# Patient Record
Sex: Male | Born: 1940 | Race: White | Hispanic: No | Marital: Married | Smoking: Former smoker
Health system: Southern US, Community
[De-identification: ages and names within clinical notes are randomized; demographics above are authoritative.]

## PROBLEM LIST (undated history)

## (undated) DIAGNOSIS — I5042 Chronic combined systolic (congestive) and diastolic (congestive) heart failure: Secondary | ICD-10-CM

## (undated) DIAGNOSIS — N400 Enlarged prostate without lower urinary tract symptoms: Secondary | ICD-10-CM

## (undated) DIAGNOSIS — M542 Cervicalgia: Secondary | ICD-10-CM

## (undated) DIAGNOSIS — I4891 Unspecified atrial fibrillation: Secondary | ICD-10-CM

## (undated) DIAGNOSIS — K219 Gastro-esophageal reflux disease without esophagitis: Secondary | ICD-10-CM

## (undated) DIAGNOSIS — G629 Polyneuropathy, unspecified: Secondary | ICD-10-CM

## (undated) DIAGNOSIS — G4700 Insomnia, unspecified: Secondary | ICD-10-CM

## (undated) DIAGNOSIS — N189 Chronic kidney disease, unspecified: Secondary | ICD-10-CM

## (undated) HISTORY — DX: Polyneuropathy, unspecified: G62.9

## (undated) HISTORY — PX: TONSILLECTOMY: SUR1361

## (undated) HISTORY — DX: Cervicalgia: M54.2

---

## 1997-06-19 ENCOUNTER — Emergency Department (HOSPITAL_COMMUNITY): Admission: EM | Admit: 1997-06-19 | Discharge: 1997-06-19 | Payer: Self-pay | Admitting: Emergency Medicine

## 1998-01-26 ENCOUNTER — Ambulatory Visit (HOSPITAL_COMMUNITY): Admission: RE | Admit: 1998-01-26 | Discharge: 1998-01-26 | Payer: Self-pay | Admitting: Gastroenterology

## 1999-01-08 ENCOUNTER — Ambulatory Visit (HOSPITAL_COMMUNITY): Admission: RE | Admit: 1999-01-08 | Discharge: 1999-01-08 | Payer: Self-pay | Admitting: Internal Medicine

## 2008-02-19 HISTORY — PX: SHOULDER ARTHROSCOPY: SHX128

## 2008-03-02 ENCOUNTER — Encounter: Admission: RE | Admit: 2008-03-02 | Discharge: 2008-03-02 | Payer: Self-pay | Admitting: Family Medicine

## 2008-07-19 ENCOUNTER — Encounter: Admission: RE | Admit: 2008-07-19 | Discharge: 2008-07-19 | Payer: Self-pay | Admitting: Emergency Medicine

## 2009-08-03 ENCOUNTER — Ambulatory Visit (HOSPITAL_COMMUNITY): Admission: RE | Admit: 2009-08-03 | Discharge: 2009-08-03 | Payer: Self-pay | Admitting: Specialist

## 2010-04-19 ENCOUNTER — Other Ambulatory Visit: Payer: Self-pay | Admitting: Otolaryngology

## 2010-07-06 NOTE — Procedures (Signed)
[place]  Patient:    AGAM DAVENPORT                        MRN: [id number] Proc. Date: 01/08/99 Adm. Date:  [id number] Attending:  Mervin Hack CC:         Hedwig Morton. Juanda Chance, M.D. [facility]                           Procedure Report  PROCEDURE:  Upper endoscopy with esophageal dilation.  INDICATIONS:  This 70 year old white male has a history of benign distal esophageal stricture.  ______  dilatation was carried out a year and a half ago.  The patient had run out of Prevacid.  In the past 2-3 weeks, he developed solid food dysphagia. He was put back on Prevacid 30 mg twice a day with some mild improvement of his  symptoms, but persistent solid food dysphagia.  He denies hoarseness, coughing t night, or chest pain.  He is undergoing an upper endoscopy with esophageal dilation.  ENDOSCOPE:  Fujinon single-channel video endoscope.  SEDATION:  Versed 10 mg IV and Demerol 100 mg IV.  FINDINGS:  The Fujinon single-channel video endoscope passed under direct vision through the posterior oropharynx into the esophagus.  The patient was monitored by pulse oximeter.  His oxygen saturations were normal.  He was very cooperative. The gag reflex was present.  The proximal and mid esophageal mucosa were unremarkable. In the distal esophagus, there was a concentric, fibrotic esophageal stricture which allowed the endoscope to traverse into a hiatal hernia without any resistance.  The diameter of the stricture was about 13 mm.  There were no acute erosions or any acute inflammatory changes within the stricture.  STOMACH:  The stomach was insufflated with air.  There was a non-reducible hiatal hernia extending from 36-40 cm from the incisors.  The gastric folds were otherwise normal.  There was no bile reflux.  The gastric antrum was unremarkable. Normal pyloric outlet.  Retroflexion of endoscope could not be carried out.  The patient was unable  to retain insufflated air.  DUODENUM:  The duodenum, duodenal bulb, and descending duodenum were normal. The Savary guide wire was then place through the endoscope.  The endoscope was retracted, and a Savary dilator was passed over the guide wire without using fluoroscopy.  A 14 mm, 16 mm, 17 mm, and 18 mm dilators passed over the guide wire. There was blood on the last dilator.  The patient tolerated the procedure well.  IMPRESSION: 1. Benign distal esophageal stricture, status post dilation to 18 mm. 2. A hiatal hernia.  PLAN: 1. Continue antireflux measures. 2. Resume Prevacid 30 mg a day. 3. If the patient needs another dilation within a year, I think that he also    consider the possibility of Nissen fundoplication.  This has been discussed ith    the patient who currently prefers medical therapy.  He will abort the use of  aspirin and NSAIDs. DD:  01/08/99 TD:  01/09/99 Job: [id number]

## 2010-09-10 ENCOUNTER — Other Ambulatory Visit: Payer: Self-pay | Admitting: Neurosurgery

## 2010-09-10 DIAGNOSIS — M5020 Other cervical disc displacement, unspecified cervical region: Secondary | ICD-10-CM

## 2010-09-14 ENCOUNTER — Ambulatory Visit
Admission: RE | Admit: 2010-09-14 | Discharge: 2010-09-14 | Disposition: A | Discharge status: Home / Self Care | Payer: Medicare Other | Source: Ambulatory Visit | Attending: Neurosurgery | Admitting: Neurosurgery

## 2010-09-14 DIAGNOSIS — M5020 Other cervical disc displacement, unspecified cervical region: Secondary | ICD-10-CM

## 2010-12-21 ENCOUNTER — Ambulatory Visit (HOSPITAL_COMMUNITY)
Admission: RE | Admit: 2010-12-21 | Discharge: 2010-12-21 | Disposition: A | Discharge status: Home / Self Care | Payer: Medicare Other | Source: Ambulatory Visit | Attending: Specialist | Admitting: Specialist

## 2010-12-21 ENCOUNTER — Other Ambulatory Visit (HOSPITAL_COMMUNITY): Payer: Self-pay | Admitting: Specialist

## 2010-12-21 DIAGNOSIS — Z0389 Encounter for observation for other suspected diseases and conditions ruled out: Secondary | ICD-10-CM | POA: Insufficient documentation

## 2010-12-21 DIAGNOSIS — R52 Pain, unspecified: Secondary | ICD-10-CM

## 2011-01-24 ENCOUNTER — Other Ambulatory Visit: Payer: Self-pay | Admitting: Family Medicine

## 2011-01-24 DIAGNOSIS — Z136 Encounter for screening for cardiovascular disorders: Secondary | ICD-10-CM

## 2011-01-25 ENCOUNTER — Other Ambulatory Visit: Payer: Self-pay | Admitting: Pain Medicine

## 2011-01-26 ENCOUNTER — Encounter (HOSPITAL_BASED_OUTPATIENT_CLINIC_OR_DEPARTMENT_OTHER): Payer: Self-pay | Admitting: Anesthesiology

## 2011-01-28 ENCOUNTER — Other Ambulatory Visit: Payer: Self-pay | Admitting: Pain Medicine

## 2011-01-28 ENCOUNTER — Encounter (HOSPITAL_BASED_OUTPATIENT_CLINIC_OR_DEPARTMENT_OTHER): Payer: Self-pay | Admitting: *Deleted

## 2011-01-28 NOTE — Progress Notes (Signed)
To [facility] at 1030.Hg,ekg on arrival.NPO after mn.

## 2011-01-29 ENCOUNTER — Ambulatory Visit (HOSPITAL_BASED_OUTPATIENT_CLINIC_OR_DEPARTMENT_OTHER): Admission: RE | Admit: 2011-01-29 | Payer: Medicare Other | Source: Ambulatory Visit | Admitting: Specialist

## 2011-01-29 ENCOUNTER — Encounter (HOSPITAL_BASED_OUTPATIENT_CLINIC_OR_DEPARTMENT_OTHER): Admission: RE | Payer: Self-pay | Source: Ambulatory Visit

## 2011-01-29 HISTORY — DX: Insomnia, unspecified: G47.00

## 2011-01-29 HISTORY — DX: Gastro-esophageal reflux disease without esophagitis: K21.9

## 2011-01-29 SURGERY — SHOULDER ARTHROSCOPY WITH ROTATOR CUFF REPAIR AND SUBACROMIAL DECOMPRESSION
Anesthesia: General | Laterality: Right

## 2011-02-04 ENCOUNTER — Other Ambulatory Visit: Payer: Self-pay | Admitting: Pain Medicine

## 2011-02-13 ENCOUNTER — Encounter (HOSPITAL_BASED_OUTPATIENT_CLINIC_OR_DEPARTMENT_OTHER): Payer: Self-pay | Admitting: *Deleted

## 2011-02-13 NOTE — Progress Notes (Signed)
To [facility] at 1030.Hg ,Ekg on arrival.npo after mn.

## 2011-02-15 ENCOUNTER — Encounter (HOSPITAL_BASED_OUTPATIENT_CLINIC_OR_DEPARTMENT_OTHER): Payer: Self-pay | Admitting: Anesthesiology

## 2011-02-15 ENCOUNTER — Encounter (HOSPITAL_BASED_OUTPATIENT_CLINIC_OR_DEPARTMENT_OTHER): Payer: Self-pay | Admitting: *Deleted

## 2011-02-15 ENCOUNTER — Encounter (HOSPITAL_BASED_OUTPATIENT_CLINIC_OR_DEPARTMENT_OTHER)
Admission: RE | Disposition: A | Discharge status: Home / Self Care | Payer: Self-pay | Source: Ambulatory Visit | Attending: Specialist

## 2011-02-15 ENCOUNTER — Ambulatory Visit (HOSPITAL_BASED_OUTPATIENT_CLINIC_OR_DEPARTMENT_OTHER): Payer: Medicare Other | Admitting: Anesthesiology

## 2011-02-15 ENCOUNTER — Ambulatory Visit (HOSPITAL_BASED_OUTPATIENT_CLINIC_OR_DEPARTMENT_OTHER)
Admission: RE | Admit: 2011-02-15 | Discharge: 2011-02-15 | Disposition: A | Discharge status: Home / Self Care | Payer: Medicare Other | Source: Ambulatory Visit | Attending: Specialist | Admitting: Specialist

## 2011-02-15 ENCOUNTER — Other Ambulatory Visit: Payer: Self-pay

## 2011-02-15 DIAGNOSIS — Z7982 Long term (current) use of aspirin: Secondary | ICD-10-CM | POA: Insufficient documentation

## 2011-02-15 DIAGNOSIS — M25519 Pain in unspecified shoulder: Secondary | ICD-10-CM | POA: Insufficient documentation

## 2011-02-15 DIAGNOSIS — N400 Enlarged prostate without lower urinary tract symptoms: Secondary | ICD-10-CM | POA: Insufficient documentation

## 2011-02-15 DIAGNOSIS — M67919 Unspecified disorder of synovium and tendon, unspecified shoulder: Secondary | ICD-10-CM | POA: Insufficient documentation

## 2011-02-15 DIAGNOSIS — K219 Gastro-esophageal reflux disease without esophagitis: Secondary | ICD-10-CM | POA: Insufficient documentation

## 2011-02-15 DIAGNOSIS — M719 Bursopathy, unspecified: Secondary | ICD-10-CM | POA: Insufficient documentation

## 2011-02-15 DIAGNOSIS — Z79899 Other long term (current) drug therapy: Secondary | ICD-10-CM | POA: Insufficient documentation

## 2011-02-15 DIAGNOSIS — R29898 Other symptoms and signs involving the musculoskeletal system: Secondary | ICD-10-CM | POA: Insufficient documentation

## 2011-02-15 DIAGNOSIS — Z9889 Other specified postprocedural states: Secondary | ICD-10-CM

## 2011-02-15 DIAGNOSIS — M24619 Ankylosis, unspecified shoulder: Secondary | ICD-10-CM | POA: Insufficient documentation

## 2011-02-15 HISTORY — DX: Benign prostatic hyperplasia without lower urinary tract symptoms: N40.0

## 2011-02-15 HISTORY — PX: SHOULDER ARTHROSCOPY: SHX128

## 2011-02-15 LAB — POCT HEMOGLOBIN-HEMACUE: Hemoglobin: 13.5 g/dL (ref 13.0–17.0)

## 2011-02-15 SURGERY — ARTHROSCOPY, SHOULDER
Anesthesia: General | Site: Shoulder | Laterality: Right | Wound class: Clean

## 2011-02-15 MED ORDER — ROCURONIUM BROMIDE 100 MG/10ML IV SOLN
INTRAVENOUS | Status: DC | PRN
  Administered 2011-02-15: 30 mg via INTRAVENOUS

## 2011-02-15 MED ORDER — SODIUM CHLORIDE 0.9 % IR SOLN
Status: DC | PRN
  Administered 2011-02-15: 12000 mL

## 2011-02-15 MED ORDER — CHLORHEXIDINE GLUCONATE 4 % EX LIQD: 60.0000 mL | Freq: Once | CUTANEOUS | Status: DC

## 2011-02-15 MED ORDER — SODIUM CHLORIDE 0.9 % IV SOLN: INTRAVENOUS | Status: DC

## 2011-02-15 MED ORDER — LACTATED RINGERS IV SOLN
INTRAVENOUS | Status: DC | PRN
  Administered 2011-02-15 (×2): via INTRAVENOUS

## 2011-02-15 MED ORDER — MIDAZOLAM HCL 5 MG/5ML IJ SOLN
INTRAMUSCULAR | Status: DC | PRN
  Administered 2011-02-15: 2 mg via INTRAVENOUS

## 2011-02-15 MED ORDER — MIDAZOLAM HCL 2 MG/2ML IJ SOLN
2.0000 mg | Freq: Once | INTRAMUSCULAR | Status: AC
  Administered 2011-02-15: 2 mg via INTRAVENOUS

## 2011-02-15 MED ORDER — NEOSTIGMINE METHYLSULFATE 1 MG/ML IJ SOLN
INTRAMUSCULAR | Status: DC | PRN
  Administered 2011-02-15: 4 mg via INTRAVENOUS

## 2011-02-15 MED ORDER — FENTANYL CITRATE 0.05 MG/ML IJ SOLN
100.0000 ug | Freq: Once | INTRAMUSCULAR | Status: AC
  Administered 2011-02-15: 100 ug via INTRAVENOUS

## 2011-02-15 MED ORDER — SODIUM CHLORIDE 0.9 % IJ SOLN
INTRAMUSCULAR | Status: DC | PRN
  Administered 2011-02-15 (×2): via INTRAMUSCULAR

## 2011-02-15 MED ORDER — METHOCARBAMOL 500 MG PO TABS: 500.0000 mg | ORAL_TABLET | Freq: Four times a day (QID) | ORAL | Status: AC | PRN

## 2011-02-15 MED ORDER — LIDOCAINE HCL (CARDIAC) 20 MG/ML IV SOLN
INTRAVENOUS | Status: DC | PRN
  Administered 2011-02-15: 100 mg via INTRAVENOUS

## 2011-02-15 MED ORDER — KETOROLAC TROMETHAMINE 30 MG/ML IJ SOLN
INTRAMUSCULAR | Status: DC | PRN
  Administered 2011-02-15: 30 mg via INTRAVENOUS

## 2011-02-15 MED ORDER — FENTANYL CITRATE 0.05 MG/ML IJ SOLN
INTRAMUSCULAR | Status: DC | PRN
  Administered 2011-02-15: 50 ug via INTRAVENOUS
  Administered 2011-02-15: 100 ug via INTRAVENOUS
  Administered 2011-02-15: 50 ug via INTRAVENOUS

## 2011-02-15 MED ORDER — LACTATED RINGERS IV SOLN: INTRAVENOUS | Status: DC | PRN

## 2011-02-15 MED ORDER — HYDROMORPHONE HCL 2 MG PO TABS: 2.0000 mg | ORAL_TABLET | ORAL | Status: AC | PRN

## 2011-02-15 MED ORDER — ONDANSETRON HCL 4 MG/2ML IJ SOLN
INTRAMUSCULAR | Status: DC | PRN
  Administered 2011-02-15: 4 mg via INTRAVENOUS

## 2011-02-15 MED ORDER — GLYCOPYRROLATE 0.2 MG/ML IJ SOLN
INTRAMUSCULAR | Status: DC | PRN
  Administered 2011-02-15: .4 mg via INTRAVENOUS

## 2011-02-15 MED ORDER — PROPOFOL 10 MG/ML IV EMUL
INTRAVENOUS | Status: DC | PRN
  Administered 2011-02-15: 180 mg via INTRAVENOUS

## 2011-02-15 MED ORDER — SUCCINYLCHOLINE CHLORIDE 20 MG/ML IJ SOLN
INTRAMUSCULAR | Status: DC | PRN
  Administered 2011-02-15: 60 mg via INTRAVENOUS

## 2011-02-15 MED ORDER — TRIAMCINOLONE ACETONIDE 40 MG/ML IJ SUSP
INTRAMUSCULAR | Status: DC | PRN
  Administered 2011-02-15: 80 mg

## 2011-02-15 MED ORDER — CEFAZOLIN SODIUM 1-5 GM-% IV SOLN: 1.0000 g | INTRAVENOUS | Status: DC

## 2011-02-15 MED ORDER — CEPHALEXIN 500 MG PO CAPS: 500.0000 mg | ORAL_CAPSULE | Freq: Three times a day (TID) | ORAL | Status: AC

## 2011-02-15 MED ORDER — PROMETHAZINE HCL 25 MG/ML IJ SOLN: 6.2500 mg | INTRAMUSCULAR | Status: DC | PRN

## 2011-02-15 MED ORDER — LACTATED RINGERS IV SOLN
INTRAVENOUS | Status: DC
  Administered 2011-02-15: 100 mL/h via INTRAVENOUS

## 2011-02-15 MED ORDER — LACTATED RINGERS IV SOLN: INTRAVENOUS | Status: DC

## 2011-02-15 MED ORDER — DEXAMETHASONE SODIUM PHOSPHATE 4 MG/ML IJ SOLN
INTRAMUSCULAR | Status: DC | PRN
  Administered 2011-02-15: 4 mg via INTRAVENOUS

## 2011-02-15 MED ORDER — POVIDONE-IODINE 7.5 % EX SOLN: Freq: Once | CUTANEOUS | Status: DC

## 2011-02-15 MED ORDER — BUPIVACAINE HCL (PF) 0.25 % IJ SOLN
INTRAMUSCULAR | Status: DC | PRN
  Administered 2011-02-15: 10 mL

## 2011-02-15 MED ORDER — ROPIVACAINE HCL 5 MG/ML IJ SOLN
INTRAMUSCULAR | Status: DC | PRN
  Administered 2011-02-15: 30 mL

## 2011-02-15 MED ORDER — CEFAZOLIN SODIUM-DEXTROSE 2-3 GM-% IV SOLR: 2.0000 g | INTRAVENOUS | Status: DC

## 2011-02-15 SURGICAL SUPPLY — 75 items
BLADE CUDA 4.2 (BLADE) IMPLANT
BLADE CUDA GRT WHITE 3.5 (BLADE) ×3 IMPLANT
BLADE CUTTER GATOR 3.5 (BLADE) IMPLANT
BLADE GREAT WHITE 4.2 (BLADE) ×3 IMPLANT
BLADE SURG 11 STRL SS (BLADE) ×3 IMPLANT
BLADE SURG 15 STRL LF DISP TIS (BLADE) ×1 IMPLANT
BLADE SURG 15 STRL SS (BLADE)
BUR 3.5 LG SPHERICAL (BURR) IMPLANT
BUR OVAL 6.0 (BURR) ×3 IMPLANT
BURR 3.5 LG SPHERICAL (BURR)
CANISTER SUCT LVC 12 LTR MEDI- (MISCELLANEOUS) ×7 IMPLANT
CANISTER SUCTION 2500CC (MISCELLANEOUS) ×2 IMPLANT
CANNULA 5.75X7 CRYSTAL CLEAR (CANNULA) ×3 IMPLANT
CANNULA 5.75X71 LONG (CANNULA) IMPLANT
CANNULA TWIST IN 8.25X7CM (CANNULA) ×6 IMPLANT
CLOTH BEACON ORANGE TIMEOUT ST (SAFETY) ×3 IMPLANT
COVER MAYO STAND STRL (DRAPES) ×3 IMPLANT
COVER TABLE BACK 60X90 (DRAPES) ×3 IMPLANT
DRAPE LG THREE QUARTER DISP (DRAPES) IMPLANT
DRAPE ORTHO SPLIT 77X108 STRL (DRAPES) ×6
DRAPE POUCH INSTRU U-SHP 10X18 (DRAPES) ×3 IMPLANT
DRAPE STERI 35X30 U-POUCH (DRAPES) ×3 IMPLANT
DRAPE SURG 17X23 STRL (DRAPES) ×3 IMPLANT
DRAPE SURG ORHT 6 SPLT 77X108 (DRAPES) ×4 IMPLANT
DRAPE U-SHAPE 47X51 STRL (DRAPES) ×3 IMPLANT
DRSG PAD ABDOMINAL 8X10 ST (GAUZE/BANDAGES/DRESSINGS) ×4 IMPLANT
DURAPREP 26ML APPLICATOR (WOUND CARE) ×3 IMPLANT
ELECT MENISCUS 165MM 90D (ELECTRODE) IMPLANT
ELECT REM PT RETURN 9FT ADLT (ELECTROSURGICAL) ×3
ELECTRODE REM PT RTRN 9FT ADLT (ELECTROSURGICAL) ×2 IMPLANT
FIBERSTICK 2 (SUTURE) IMPLANT
GAUZE SPONGE 4X4 12PLY STRL LF (GAUZE/BANDAGES/DRESSINGS) ×2 IMPLANT
GAUZE XEROFORM 1X8 LF (GAUZE/BANDAGES/DRESSINGS) ×3 IMPLANT
GLOVE BIO SURGEON STRL SZ7.5 (GLOVE) ×3 IMPLANT
GLOVE INDICATOR 6.5 STRL GRN (GLOVE) ×4 IMPLANT
GLOVE INDICATOR 8.0 STRL GRN (GLOVE) ×6 IMPLANT
GLOVE SURG ORTHO 8.0 STRL STRW (GLOVE) ×3 IMPLANT
GOWN STRL REIN XL XLG (GOWN DISPOSABLE) ×3 IMPLANT
GOWN W/COTTON TOWEL STD LRG (GOWNS) ×3 IMPLANT
GOWN XL W/COTTON TOWEL STD (GOWNS) ×3 IMPLANT
KIT SHOULDER TRACTION (DRAPES) ×3 IMPLANT
LASSO SUT 90 DEGREE (SUTURE) IMPLANT
NDL 1/2 CIR CATGUT .05X1.09 (NEEDLE) IMPLANT
NDL SCORPION (NEEDLE) IMPLANT
NDL SPNL 18GX3.5 QUINCKE PK (NEEDLE) ×1 IMPLANT
NEEDLE 1/2 CIR CATGUT .05X1.09 (NEEDLE) IMPLANT
NEEDLE HYPO 22GX1.5 SAFETY (NEEDLE) ×3 IMPLANT
NEEDLE SCORPION (NEEDLE) IMPLANT
NEEDLE SPNL 18GX3.5 QUINCKE PK (NEEDLE) ×3 IMPLANT
NS IRRIG 500ML POUR BTL (IV SOLUTION) IMPLANT
PACK BASIN DAY SURGERY FS (CUSTOM PROCEDURE TRAY) ×3 IMPLANT
PENCIL BUTTON HOLSTER BLD 10FT (ELECTRODE) IMPLANT
SET ARTHROSCOPY TUBING (MISCELLANEOUS) ×3
SET ARTHROSCOPY TUBING LN (MISCELLANEOUS) ×2 IMPLANT
SLING ARM FOAM STRAP LRG (SOFTGOODS) ×2 IMPLANT
SLING ULTRA II AB L (ORTHOPEDIC SUPPLIES) IMPLANT
SLING ULTRA II AB S (ORTHOPEDIC SUPPLIES) IMPLANT
SPONGE GAUZE 4X4 12PLY (GAUZE/BANDAGES/DRESSINGS) ×3 IMPLANT
SPONGE LAP 4X18 X RAY DECT (DISPOSABLE) ×2 IMPLANT
SUCTION FRAZIER TIP 10 FR DISP (SUCTIONS) IMPLANT
SUT ETHILON 2 0 PS N (SUTURE) ×3 IMPLANT
SUT LASSO 45 DEGREE LEFT (SUTURE) IMPLANT
SUT LASSO 45D RIGHT (SUTURE) IMPLANT
SUT PDS AB 1 CT1 27 (SUTURE) IMPLANT
SUT VIC AB 0 CT1 36 (SUTURE) IMPLANT
SUT VIC AB 2-0 CT1 27 (SUTURE)
SUT VIC AB 2-0 CT1 TAPERPNT 27 (SUTURE) IMPLANT
SYR 20CC LL (SYRINGE) ×5 IMPLANT
SYR CONTROL 10ML LL (SYRINGE) IMPLANT
TAPE CLOTH SURG 6X10 WHT LF (GAUZE/BANDAGES/DRESSINGS) ×2 IMPLANT
TOWEL OR 17X24 6PK STRL BLUE (TOWEL DISPOSABLE) ×3 IMPLANT
TUBE CONNECTING 12X1/4 (SUCTIONS) ×6 IMPLANT
WAND 90 DEG TURBOVAC W/CORD (SURGICAL WAND) ×3 IMPLANT
WATER STERILE IRR 500ML POUR (IV SOLUTION) ×3 IMPLANT
YANKAUER SUCT BULB TIP NO VENT (SUCTIONS) IMPLANT

## 2011-02-15 NOTE — Progress Notes (Signed)
Unable to lift rt arm.  Pt tolerating well.  No family available.

## 2011-02-15 NOTE — Op Note (Signed)
Preop diagnosis right shoulder status post rotator cuff repair continued pain possible recurrent tear Postop diagnosis right shoulder status post rotator cuff repair with intact rotator cuff tear and rotator cuff tendinopathy. Procedure right shoulder examination under anesthesia. Glenohumeral diagnostic scope. Subacromial debridement Surgeon Valma Cava M.D. Asst. Kingsley Spittle Anesthesia interscalene block general anesthetic. History blood loss minimal Complications none Disposition PACU stable  Operative details line patient patient was met in the holding area. Cracks site was identified and marked appropriately. Interscalene block administered by the anesthesiologist. Otherwise the operating room 2 g of Ancef was given intravenously. The operating room placed under general anesthesia. Current into a left lateral decubitus position probably padded and bumped. Right shoulder examined full functional range of motion and stable. Prepped with DuraPrep and draped into a sterile fashion. Overhead shoulder positioner was utilized at 30 abduction 10 of full flexion and 10 pounds longitudinal traction. Elijah Birk out was done and confirmed by all involved and room. Posterior portal was crated also placed into the glenohumeral joint diagnostic arthroscopy revealed intact biceps labrum articular cartilage ligaments rotator cuff repair at 80 mm mercury there was no extravasation of fluid  10 Garamycin to the insertion site of done just lateral to the articular surface. And to be intact and satisfactory.  Subacromial region revealed scarring and adhesions. Lateral portal was established and subacromial region was deflated. Rotator cuff the bursal surface was inspected on multiple occasions found to be intact the suture line was intact the supraspinatus repair was palpated it was thinner than but was intact from the musculotendinous junction to the surface of the greater tuberosity. Anterior posterior to the to the  1.5 cm area of softness was intact. Point time it was opted to not proceed with taking down the intact cuff repair and did not go to be his best inched to proceed with that to be followed by patch. Tendinopathy present. No other adenopathy noted. Arthroscopic root was removed. Taken out of traction. Portals were closed with 4 nylon suture. 80 mg of Kenalog was placed in cervical region with 10 cc of 0.25% Sensorcaine. Service is brought into supine position and placed in a sling awakened and taken to the operating room into the PACU in stable condition. No complications. Patient destabilizing the PACU discharge to home.

## 2011-02-15 NOTE — Transfer of Care (Signed)
Immediate Anesthesia Transfer of Care Note  Patient: Dustin [name]  Procedure(s) Performed:  ARTHROSCOPY SHOULDER  Patient Location: PACU  Anesthesia Type: General  Level of Consciousness: awake, sedated, patient cooperative and responds to stimulation  Airway & Oxygen Therapy: Patient Spontanous Breathing and Patient connected to face mask oxygen  Post-op Assessment: Report given to PACU RN, Post -op Vital signs reviewed and stable and Patient moving all extremities  Post vital signs: Reviewed and stable  Complications: No apparent anesthesia complications

## 2011-02-15 NOTE — Anesthesia Preprocedure Evaluation (Addendum)
Anesthesia Evaluation  Patient identified by MRN, date of birth, ID band Patient awake    Reviewed: Allergy & Precautions, H&P , NPO status , Patient's Chart, lab work & pertinent test results  Airway Mallampati: II TM Distance: >3 FB Neck ROM: full    Dental  (+) Missing A couple of missing teeth:   Pulmonary neg pulmonary ROS,  clear to auscultation  Pulmonary exam normal       Cardiovascular Exercise Tolerance: Good neg cardio ROS regular Normal    Neuro/Psych Negative Neurological ROS  Negative Psych ROS   GI/Hepatic negative GI ROS, Neg liver ROS, GERD-  Medicated and Controlled,  Endo/Other  Negative Endocrine ROS  Renal/GU negative Renal ROS  Genitourinary negative   Musculoskeletal   Abdominal   Peds  Hematology negative hematology ROS (+)   Anesthesia Other Findings   Reproductive/Obstetrics negative OB ROS                          Anesthesia Physical Anesthesia Plan  ASA: II  Anesthesia Plan: General   Post-op Pain Management:    Induction: Intravenous  Airway Management Planned: Oral ETT  Additional Equipment:   Intra-op Plan:   Post-operative Plan: Extubation in OR  Informed Consent: I have reviewed the patients History and Physical, chart, labs and discussed the procedure including the risks, benefits and alternatives for the proposed anesthesia with the patient or authorized representative who has indicated his/her understanding and acceptance.   Dental Advisory Given  Plan Discussed with: CRNA and Surgeon  Anesthesia Plan Comments:         Anesthesia Quick Evaluation

## 2011-02-15 NOTE — Anesthesia Postprocedure Evaluation (Signed)
  Anesthesia Post-op Note  Patient: Dustin [name]  Procedure(s) Performed:  ARTHROSCOPY SHOULDER - Debridement  Patient Location: PACU  Anesthesia Type: GA combined with regional for post-op pain  Level of Consciousness: awake and alert   Airway and Oxygen Therapy: Patient Spontanous Breathing  Post-op Pain: mild  Post-op Assessment: Post-op Vital signs reviewed, Patient's Cardiovascular Status Stable, Respiratory Function Stable, Patent Airway and No signs of Nausea or vomiting  Post-op Vital Signs: stable  Complications: No apparent anesthesia complications

## 2011-02-15 NOTE — H&P (Cosign Needed)
Dustin [name] is an 70 y.o. male.   Chief Complaint: Right shoulder pain and weakness status post previous rotator cuff repair HPI: Patient is a 70 year old gentleman evaluated by Dr. Thomasena Edis for right shoulder weakness and pain patient continued to have pain and weakness in that shoulder despite conservative measures MRI arthrogram reveals a high-grade partial rotator cuff tear with a history of previous rotator cuff repair. Patient and Dr. Thomasena Edis and discuss this patient is elected proceed with surgical evaluation with possible arthroscopic versus open rotator cuff repair with possible augmentation graft.  Past Medical History  Diagnosis Date  . GERD (gastroesophageal reflux disease)     protonix for control  . Insomnia   . BPH (benign prostatic hyperplasia)     Past Surgical History  Procedure Date  . Shoulder arthroscopy 2010    rt shoulder    History reviewed. No pertinent family history. Social History:  reports that he quit smoking about 21 years ago. He does not have any smokeless tobacco history on file. He reports that he does not drink alcohol. His drug history not on file.  Allergies: No Known Allergies  Medications Prior to Admission  Medication Dose Route Frequency Provider Last Rate Last Dose  . 0.9 %  sodium chloride infusion   Intravenous Continuous Jamelle Rushing, PA      . ceFAZolin (ANCEF) IVPB 2 g/50 mL premix  2 g Intravenous 60 min Pre-Op Erasmo Leventhal      . chlorhexidine (HIBICLENS) 4 % liquid 4 application  60 mL Topical Once Jamelle Rushing, [name]      . fentaNYL (SUBLIMAZE) injection 100 mcg  100 mcg Intravenous Once Gaetano Hawthorne, MD   100 mcg at 02/15/11 1206  . lactated ringers infusion   Intravenous Continuous Azell Der, MD 100 mL/hr at 02/15/11 1035 100 mL/hr at 02/15/11 1035  . midazolam (VERSED) injection 2 mg  2 mg Intravenous Once Gaetano Hawthorne, MD   2 mg at 02/15/11 1208  . povidone-iodine (BETADINE) 7.5 % scrub   Topical Once Jamelle Rushing, [name]      . DISCONTD: ceFAZolin (ANCEF) IVPB 1 g/50 mL premix  1 g Intravenous 60 min Pre-Op Jamelle Rushing, PA       Medications Prior to Admission  Medication Sig Dispense Refill  . aspirin 81 MG tablet Take 81 mg by mouth daily.        . cholecalciferol (VITAMIN D) 1000 UNITS tablet Take 2,000 Units by mouth daily. Takes 2000 iu       . doxazosin (CARDURA) 4 MG tablet Take 4 mg by mouth at bedtime.        . eszopiclone (LUNESTA) 2 MG TABS Take 2 mg by mouth at bedtime. Take immediately before bedtime/states takes 3 mg       . finasteride (PROSCAR) 5 MG tablet Take 5 mg by mouth daily.        Marland Kitchen glucosamine-chondroitin 500-400 MG tablet Take 1 tablet by mouth 3 (three) times daily. Takes 1500 mg       . methocarbamol (ROBAXIN) 500 MG tablet Take 500 mg by mouth 4 (four) times daily.        . multivitamin (THERAGRAN) per tablet Take 1 tablet by mouth daily.        Marland Kitchen oxyCODONE-acetaminophen (PERCOCET) 7.5-325 MG per tablet Take 1 tablet by mouth every 4 (four) hours as needed.        . pantoprazole (PROTONIX) 40 MG tablet Take  40 mg by mouth daily.          Results for orders placed during the hospital encounter of 02/15/11 (from the past 48 hour(s))  POCT HEMOGLOBIN-HEMACUE     Status: Normal   Collection Time   02/15/11 10:39 AM      Component Value Range Comment   Hemoglobin 13.5  13.0 - 17.0 (g/dL)    No results found.  Review of Systems  Constitutional: Negative.   HENT: Negative.   Eyes: Negative.   Respiratory: Negative.   Gastrointestinal: Negative.   Genitourinary: Negative.   Musculoskeletal: Positive for joint pain.  Skin: Negative.   Neurological: Negative.   Endo/Heme/Allergies: Negative.   Psychiatric/Behavioral: Negative.     Blood pressure 125/68, pulse 66, temperature 97.4 F (36.3 C), temperature source Oral, resp. rate 14, height 6\' 2"  (1.88 m), weight 95.255 kg (210 lb), SpO2 100.00%. Physical Exam  patient is a well-developed healthy-appearing  conscious alert and appropriate patient no distress resting comfortably on a hospital gurney. Neck is supple no palpable lymphadenopathy. Lungs are clear throughout. Heart is regular rate and rhythm. Abdomen soft nontender bowel sounds present. Scope with motion both upper extremities except for his right shoulder which is weak as he is gotten a right-sided interscalene block at this point. His got good pulses sensation in his hands. Got good pulses in his lower extremities.  Assessment/Plan Right shoulder pain with MR arthrogram positive high-grade partial rotator cuff tear status post history of previous rotator cuff repair. Plan arthroscopic evaluation of right shoulder with possible closed arthroscopic versus open rotator cuff repair with possible augmentation with graft material.  Aliou Mealey,Addie ANDREW 02/15/2011, 12:52 PM   I have seen and examined this patient.  Agree with the note above.  Deeksha Cotrell,Brice ANDREW 02/15/2011 12:52 PM

## 2011-02-15 NOTE — Progress Notes (Signed)
Interscalene block to r shoulder by Dr. Leta Jungling completed.  Pt tolerated well. Thumb numb on completion,  Pt advised to not move r arm due to possibility of injury.

## 2011-02-15 NOTE — Anesthesia Procedure Notes (Addendum)
Anesthesia Regional Block:  Interscalene brachial plexus block  Pre-Anesthetic Checklist: ,, timeout performed, Correct Patient, Correct Site, Correct Laterality, Correct Procedure, Correct Position, site marked, Risks and benefits discussed,  Surgical consent,  Pre-op evaluation,  At surgeon's request and post-op pain management  Laterality: Right  Prep: chloraprep       Needles:  Injection technique: Single-shot  Needle Type: Stimiplex          Additional Needles:  Procedures: ultrasound guided and nerve stimulator Interscalene brachial plexus block  Nerve Stimulator or Paresthesia:  Response: 0.5 mA, 1 ms,   Additional Responses:   Narrative:  Start time: 02/15/2011 12:10 PM End time: 02/15/2011 12:25 PM Injection made incrementally with aspirations every 5 mL.  Performed by: Personally  Anesthesiologist: Gaetano Hawthorne MD  Additional Notes: Patient tolerated the procedure well without complications  Interscalene brachial plexus block Procedure Name: Intubation Date/Time: 02/15/2011 1:04 PM Performed by: Iline Oven Pre-anesthesia Checklist: Patient identified, Emergency Drugs available, Suction available and Patient being monitored Patient Re-evaluated:Patient Re-evaluated prior to inductionOxygen Delivery Method: Circle System Utilized Preoxygenation: Pre-oxygenation with 100% oxygen Intubation Type: IV induction Ventilation: Mask ventilation without difficulty Tube type: Oral Tube size: 8.0 mm Number of attempts: 1 Airway Equipment and Method: stylet and oral airway Placement Confirmation: ETT inserted through vocal cords under direct vision,  positive ETCO2 and breath sounds checked- equal and bilateral Secured at: 22 cm Tube secured with: Tape Dental Injury: Teeth and Oropharynx as per pre-operative assessment

## 2011-02-15 NOTE — H&P (Signed)
Dustin [name] is an 70 y.o. male.   Chief Complaint: Right shoulder pain and weakness status post previous rotator cuff repair HPI: Patient is a 69 year old gentleman evaluated by Dr. Thomasena Edis for right shoulder weakness and pain patient continued to have pain and weakness in that shoulder despite conservative measures MRI arthrogram reveals a high-grade partial rotator cuff tear with a history of previous rotator cuff repair. Patient and Dr. Thomasena Edis and discuss this patient is elected proceed with surgical evaluation with possible arthroscopic versus open rotator cuff repair with possible augmentation graft.  Past Medical History  Diagnosis Date  . GERD (gastroesophageal reflux disease)     protonix for control  . Insomnia   . BPH (benign prostatic hyperplasia)     Past Surgical History  Procedure Date  . Shoulder arthroscopy 2010    rt shoulder    History reviewed. No pertinent family history. Social History:  reports that he quit smoking about 21 years ago. He does not have any smokeless tobacco history on file. He reports that he does not drink alcohol. His drug history not on file.  Allergies: No Known Allergies  Medications Prior to Admission  Medication Dose Route Frequency Provider Last Rate Last Dose  . 0.9 %  sodium chloride infusion   Intravenous Continuous Jamelle Rushing, PA      . ceFAZolin (ANCEF) IVPB 2 g/50 mL premix  2 g Intravenous 60 min Pre-Op Erasmo Leventhal      . chlorhexidine (HIBICLENS) 4 % liquid 4 application  60 mL Topical Once Jamelle Rushing, [name]      . fentaNYL (SUBLIMAZE) injection 100 mcg  100 mcg Intravenous Once Gaetano Hawthorne, MD   100 mcg at 02/15/11 1206  . lactated ringers infusion   Intravenous Continuous Azell Der, MD 100 mL/hr at 02/15/11 1035 100 mL/hr at 02/15/11 1035  . midazolam (VERSED) injection 2 mg  2 mg Intravenous Once Gaetano Hawthorne, MD   2 mg at 02/15/11 1208  . povidone-iodine (BETADINE) 7.5 % scrub   Topical Once Jamelle Rushing, [name]      . DISCONTD: ceFAZolin (ANCEF) IVPB 1 g/50 mL premix  1 g Intravenous 60 min Pre-Op Jamelle Rushing, PA       Medications Prior to Admission  Medication Sig Dispense Refill  . aspirin 81 MG tablet Take 81 mg by mouth daily.        . cholecalciferol (VITAMIN D) 1000 UNITS tablet Take 2,000 Units by mouth daily. Takes 2000 iu       . doxazosin (CARDURA) 4 MG tablet Take 4 mg by mouth at bedtime.        . eszopiclone (LUNESTA) 2 MG TABS Take 2 mg by mouth at bedtime. Take immediately before bedtime/states takes 3 mg       . finasteride (PROSCAR) 5 MG tablet Take 5 mg by mouth daily.        Marland Kitchen glucosamine-chondroitin 500-400 MG tablet Take 1 tablet by mouth 3 (three) times daily. Takes 1500 mg       . methocarbamol (ROBAXIN) 500 MG tablet Take 500 mg by mouth 4 (four) times daily.        . multivitamin (THERAGRAN) per tablet Take 1 tablet by mouth daily.        Marland Kitchen oxyCODONE-acetaminophen (PERCOCET) 7.5-325 MG per tablet Take 1 tablet by mouth every 4 (four) hours as needed.        . pantoprazole (PROTONIX) 40 MG tablet Take  40 mg by mouth daily.          Results for orders placed during the hospital encounter of 02/15/11 (from the past 48 hour(s))  POCT HEMOGLOBIN-HEMACUE     Status: Normal   Collection Time   02/15/11 10:39 AM      Component Value Range Comment   Hemoglobin 13.5  13.0 - 17.0 (g/dL)    No results found.  Review of Systems  Constitutional: Negative.   HENT: Negative.   Eyes: Negative.   Respiratory: Negative.   Gastrointestinal: Negative.   Genitourinary: Negative.   Musculoskeletal: Positive for joint pain.  Skin: Negative.   Neurological: Negative.   Endo/Heme/Allergies: Negative.   Psychiatric/Behavioral: Negative.     Blood pressure 120/69, pulse 79, temperature 97.4 F (36.3 C), temperature source Oral, resp. rate 20, height 6\' 2"  (1.88 m), weight 95.255 kg (210 lb), SpO2 99.00%. Physical Exam patient is a well-developed healthy-appearing  conscious alert and appropriate patient no distress resting comfortably on a hospital gurney. Neck is supple no palpable lymphadenopathy. Lungs are clear throughout. Heart is regular rate and rhythm. Abdomen soft nontender bowel sounds present. Scope with motion both upper extremities except for his right shoulder which is weak as he is gotten a right-sided interscalene block at this point. His got good pulses sensation in his hands. Got good pulses in his lower extremities.  Assessment/Plan Right shoulder pain with MR arthrogram positive high-grade partial rotator cuff tear status post history of previous rotator cuff repair. Plan arthroscopic evaluation of right shoulder with possible closed arthroscopic versus open rotator cuff repair with possible augmentation with graft material.  Jamelle Rushing 02/15/2011, 12:32 PM

## 2011-02-18 ENCOUNTER — Encounter (HOSPITAL_BASED_OUTPATIENT_CLINIC_OR_DEPARTMENT_OTHER): Payer: Self-pay | Admitting: Specialist

## 2011-02-27 DIAGNOSIS — M25819 Other specified joint disorders, unspecified shoulder: Secondary | ICD-10-CM | POA: Diagnosis not present

## 2011-03-04 ENCOUNTER — Ambulatory Visit: Payer: Medicare Other

## 2011-03-07 ENCOUNTER — Ambulatory Visit
Admission: RE | Admit: 2011-03-07 | Discharge: 2011-03-07 | Disposition: A | Discharge status: Home / Self Care | Payer: Medicare Other | Source: Ambulatory Visit | Attending: Family Medicine | Admitting: Family Medicine

## 2011-03-07 DIAGNOSIS — Z136 Encounter for screening for cardiovascular disorders: Secondary | ICD-10-CM

## 2011-03-15 DIAGNOSIS — M542 Cervicalgia: Secondary | ICD-10-CM | POA: Diagnosis not present

## 2011-03-15 DIAGNOSIS — M47812 Spondylosis without myelopathy or radiculopathy, cervical region: Secondary | ICD-10-CM | POA: Diagnosis not present

## 2011-03-15 DIAGNOSIS — M5380 Other specified dorsopathies, site unspecified: Secondary | ICD-10-CM | POA: Diagnosis not present

## 2011-03-25 DIAGNOSIS — R252 Cramp and spasm: Secondary | ICD-10-CM | POA: Diagnosis not present

## 2011-03-27 DIAGNOSIS — M25569 Pain in unspecified knee: Secondary | ICD-10-CM | POA: Diagnosis not present

## 2011-03-27 DIAGNOSIS — M19019 Primary osteoarthritis, unspecified shoulder: Secondary | ICD-10-CM | POA: Diagnosis not present

## 2011-04-17 DIAGNOSIS — M47812 Spondylosis without myelopathy or radiculopathy, cervical region: Secondary | ICD-10-CM | POA: Diagnosis not present

## 2011-04-17 DIAGNOSIS — M5412 Radiculopathy, cervical region: Secondary | ICD-10-CM | POA: Diagnosis not present

## 2011-04-17 DIAGNOSIS — M542 Cervicalgia: Secondary | ICD-10-CM | POA: Diagnosis not present

## 2011-04-19 DIAGNOSIS — M542 Cervicalgia: Secondary | ICD-10-CM | POA: Diagnosis not present

## 2011-05-01 DIAGNOSIS — H2510 Age-related nuclear cataract, unspecified eye: Secondary | ICD-10-CM | POA: Diagnosis not present

## 2011-05-03 DIAGNOSIS — M47817 Spondylosis without myelopathy or radiculopathy, lumbosacral region: Secondary | ICD-10-CM | POA: Diagnosis not present

## 2011-05-03 DIAGNOSIS — M5380 Other specified dorsopathies, site unspecified: Secondary | ICD-10-CM | POA: Diagnosis not present

## 2011-05-03 DIAGNOSIS — M542 Cervicalgia: Secondary | ICD-10-CM | POA: Diagnosis not present

## 2011-05-14 ENCOUNTER — Other Ambulatory Visit: Payer: Self-pay | Admitting: Family Medicine

## 2011-05-14 DIAGNOSIS — R109 Unspecified abdominal pain: Secondary | ICD-10-CM | POA: Diagnosis not present

## 2011-05-14 DIAGNOSIS — F329 Major depressive disorder, single episode, unspecified: Secondary | ICD-10-CM | POA: Diagnosis not present

## 2011-05-14 DIAGNOSIS — G479 Sleep disorder, unspecified: Secondary | ICD-10-CM | POA: Diagnosis not present

## 2011-05-20 ENCOUNTER — Ambulatory Visit
Admission: RE | Admit: 2011-05-20 | Discharge: 2011-05-20 | Disposition: A | Discharge status: Home / Self Care | Payer: Medicare Other | Source: Ambulatory Visit | Attending: Family Medicine | Admitting: Family Medicine

## 2011-05-20 DIAGNOSIS — R109 Unspecified abdominal pain: Secondary | ICD-10-CM | POA: Diagnosis not present

## 2011-05-20 DIAGNOSIS — K7689 Other specified diseases of liver: Secondary | ICD-10-CM | POA: Diagnosis not present

## 2011-05-21 DIAGNOSIS — R209 Unspecified disturbances of skin sensation: Secondary | ICD-10-CM | POA: Diagnosis not present

## 2011-05-21 DIAGNOSIS — R109 Unspecified abdominal pain: Secondary | ICD-10-CM | POA: Diagnosis not present

## 2011-05-22 DIAGNOSIS — M542 Cervicalgia: Secondary | ICD-10-CM | POA: Diagnosis not present

## 2011-05-22 DIAGNOSIS — M47812 Spondylosis without myelopathy or radiculopathy, cervical region: Secondary | ICD-10-CM | POA: Diagnosis not present

## 2011-06-05 DIAGNOSIS — R972 Elevated prostate specific antigen [PSA]: Secondary | ICD-10-CM | POA: Diagnosis not present

## 2011-06-11 DIAGNOSIS — R972 Elevated prostate specific antigen [PSA]: Secondary | ICD-10-CM | POA: Diagnosis not present

## 2011-06-11 DIAGNOSIS — N400 Enlarged prostate without lower urinary tract symptoms: Secondary | ICD-10-CM | POA: Diagnosis not present

## 2011-06-12 DIAGNOSIS — M5000 Cervical disc disorder with myelopathy, unspecified cervical region: Secondary | ICD-10-CM | POA: Diagnosis not present

## 2011-06-12 DIAGNOSIS — M4712 Other spondylosis with myelopathy, cervical region: Secondary | ICD-10-CM | POA: Diagnosis not present

## 2011-06-12 DIAGNOSIS — M4802 Spinal stenosis, cervical region: Secondary | ICD-10-CM | POA: Diagnosis not present

## 2011-06-12 DIAGNOSIS — M542 Cervicalgia: Secondary | ICD-10-CM | POA: Diagnosis not present

## 2011-06-13 DIAGNOSIS — R1032 Left lower quadrant pain: Secondary | ICD-10-CM | POA: Diagnosis not present

## 2011-06-13 DIAGNOSIS — Z86010 Personal history of colonic polyps: Secondary | ICD-10-CM | POA: Diagnosis not present

## 2011-06-20 ENCOUNTER — Other Ambulatory Visit: Payer: Self-pay | Admitting: Neurosurgery

## 2011-06-20 DIAGNOSIS — M4802 Spinal stenosis, cervical region: Secondary | ICD-10-CM

## 2011-06-20 DIAGNOSIS — M4712 Other spondylosis with myelopathy, cervical region: Secondary | ICD-10-CM

## 2011-06-20 DIAGNOSIS — M5000 Cervical disc disorder with myelopathy, unspecified cervical region: Secondary | ICD-10-CM

## 2011-06-25 DIAGNOSIS — G2581 Restless legs syndrome: Secondary | ICD-10-CM | POA: Diagnosis not present

## 2011-06-25 DIAGNOSIS — N400 Enlarged prostate without lower urinary tract symptoms: Secondary | ICD-10-CM | POA: Diagnosis not present

## 2011-06-25 DIAGNOSIS — I10 Essential (primary) hypertension: Secondary | ICD-10-CM | POA: Diagnosis not present

## 2011-06-25 DIAGNOSIS — F329 Major depressive disorder, single episode, unspecified: Secondary | ICD-10-CM | POA: Diagnosis not present

## 2011-06-25 DIAGNOSIS — G479 Sleep disorder, unspecified: Secondary | ICD-10-CM | POA: Diagnosis not present

## 2011-06-25 DIAGNOSIS — K219 Gastro-esophageal reflux disease without esophagitis: Secondary | ICD-10-CM | POA: Diagnosis not present

## 2011-06-28 ENCOUNTER — Ambulatory Visit
Admission: RE | Admit: 2011-06-28 | Discharge: 2011-06-28 | Disposition: A | Discharge status: Home / Self Care | Payer: Medicare Other | Source: Ambulatory Visit | Attending: Neurosurgery | Admitting: Neurosurgery

## 2011-06-28 DIAGNOSIS — M47812 Spondylosis without myelopathy or radiculopathy, cervical region: Secondary | ICD-10-CM | POA: Diagnosis not present

## 2011-06-28 DIAGNOSIS — M4712 Other spondylosis with myelopathy, cervical region: Secondary | ICD-10-CM

## 2011-06-28 DIAGNOSIS — M5000 Cervical disc disorder with myelopathy, unspecified cervical region: Secondary | ICD-10-CM

## 2011-06-28 DIAGNOSIS — M5020 Other cervical disc displacement, unspecified cervical region: Secondary | ICD-10-CM | POA: Diagnosis not present

## 2011-06-28 DIAGNOSIS — M4802 Spinal stenosis, cervical region: Secondary | ICD-10-CM

## 2011-06-28 DIAGNOSIS — Z1389 Encounter for screening for other disorder: Secondary | ICD-10-CM | POA: Diagnosis not present

## 2011-06-28 DIAGNOSIS — M5030 Other cervical disc degeneration, unspecified cervical region: Secondary | ICD-10-CM | POA: Diagnosis not present

## 2011-07-01 DIAGNOSIS — M4802 Spinal stenosis, cervical region: Secondary | ICD-10-CM | POA: Diagnosis not present

## 2011-07-01 DIAGNOSIS — M4712 Other spondylosis with myelopathy, cervical region: Secondary | ICD-10-CM | POA: Diagnosis not present

## 2011-07-04 DIAGNOSIS — M542 Cervicalgia: Secondary | ICD-10-CM | POA: Diagnosis not present

## 2011-07-11 DIAGNOSIS — M542 Cervicalgia: Secondary | ICD-10-CM | POA: Diagnosis not present

## 2011-07-16 DIAGNOSIS — M542 Cervicalgia: Secondary | ICD-10-CM | POA: Diagnosis not present

## 2011-07-17 DIAGNOSIS — M4802 Spinal stenosis, cervical region: Secondary | ICD-10-CM | POA: Diagnosis not present

## 2011-07-19 DIAGNOSIS — M542 Cervicalgia: Secondary | ICD-10-CM | POA: Diagnosis not present

## 2011-07-23 DIAGNOSIS — M542 Cervicalgia: Secondary | ICD-10-CM | POA: Diagnosis not present

## 2011-07-25 DIAGNOSIS — M542 Cervicalgia: Secondary | ICD-10-CM | POA: Diagnosis not present

## 2011-07-30 DIAGNOSIS — M542 Cervicalgia: Secondary | ICD-10-CM | POA: Diagnosis not present

## 2011-08-02 ENCOUNTER — Other Ambulatory Visit: Payer: Self-pay | Admitting: Gastroenterology

## 2011-08-02 DIAGNOSIS — Z86010 Personal history of colonic polyps: Secondary | ICD-10-CM | POA: Diagnosis not present

## 2011-08-02 DIAGNOSIS — D126 Benign neoplasm of colon, unspecified: Secondary | ICD-10-CM | POA: Diagnosis not present

## 2011-08-02 DIAGNOSIS — K648 Other hemorrhoids: Secondary | ICD-10-CM | POA: Diagnosis not present

## 2011-08-02 DIAGNOSIS — Z09 Encounter for follow-up examination after completed treatment for conditions other than malignant neoplasm: Secondary | ICD-10-CM | POA: Diagnosis not present

## 2011-08-06 DIAGNOSIS — R209 Unspecified disturbances of skin sensation: Secondary | ICD-10-CM | POA: Diagnosis not present

## 2011-08-06 DIAGNOSIS — R4589 Other symptoms and signs involving emotional state: Secondary | ICD-10-CM | POA: Diagnosis not present

## 2011-08-14 DIAGNOSIS — M542 Cervicalgia: Secondary | ICD-10-CM | POA: Diagnosis not present

## 2011-08-15 DIAGNOSIS — G609 Hereditary and idiopathic neuropathy, unspecified: Secondary | ICD-10-CM | POA: Diagnosis not present

## 2011-08-15 DIAGNOSIS — R209 Unspecified disturbances of skin sensation: Secondary | ICD-10-CM | POA: Diagnosis not present

## 2011-08-28 DIAGNOSIS — G63 Polyneuropathy in diseases classified elsewhere: Secondary | ICD-10-CM | POA: Diagnosis not present

## 2011-08-28 DIAGNOSIS — R209 Unspecified disturbances of skin sensation: Secondary | ICD-10-CM | POA: Diagnosis not present

## 2011-08-28 DIAGNOSIS — G609 Hereditary and idiopathic neuropathy, unspecified: Secondary | ICD-10-CM | POA: Diagnosis not present

## 2011-08-29 DIAGNOSIS — M25519 Pain in unspecified shoulder: Secondary | ICD-10-CM | POA: Diagnosis not present

## 2011-09-11 DIAGNOSIS — D649 Anemia, unspecified: Secondary | ICD-10-CM | POA: Diagnosis not present

## 2011-09-11 DIAGNOSIS — G4762 Sleep related leg cramps: Secondary | ICD-10-CM | POA: Diagnosis not present

## 2011-09-11 DIAGNOSIS — G479 Sleep disorder, unspecified: Secondary | ICD-10-CM | POA: Diagnosis not present

## 2011-09-11 DIAGNOSIS — R634 Abnormal weight loss: Secondary | ICD-10-CM | POA: Diagnosis not present

## 2011-09-19 DIAGNOSIS — N401 Enlarged prostate with lower urinary tract symptoms: Secondary | ICD-10-CM | POA: Diagnosis not present

## 2011-09-19 DIAGNOSIS — N138 Other obstructive and reflux uropathy: Secondary | ICD-10-CM | POA: Diagnosis not present

## 2011-09-30 DIAGNOSIS — M542 Cervicalgia: Secondary | ICD-10-CM | POA: Diagnosis not present

## 2011-10-01 DIAGNOSIS — G609 Hereditary and idiopathic neuropathy, unspecified: Secondary | ICD-10-CM | POA: Diagnosis not present

## 2011-10-01 DIAGNOSIS — R209 Unspecified disturbances of skin sensation: Secondary | ICD-10-CM | POA: Diagnosis not present

## 2011-10-28 DIAGNOSIS — L259 Unspecified contact dermatitis, unspecified cause: Secondary | ICD-10-CM | POA: Diagnosis not present

## 2011-10-28 DIAGNOSIS — G4700 Insomnia, unspecified: Secondary | ICD-10-CM | POA: Diagnosis not present

## 2011-10-28 DIAGNOSIS — Z23 Encounter for immunization: Secondary | ICD-10-CM | POA: Diagnosis not present

## 2011-10-28 DIAGNOSIS — Z0000 Encounter for general adult medical examination without abnormal findings: Secondary | ICD-10-CM | POA: Diagnosis not present

## 2011-10-30 DIAGNOSIS — M545 Low back pain, unspecified: Secondary | ICD-10-CM | POA: Diagnosis not present

## 2011-10-30 DIAGNOSIS — M542 Cervicalgia: Secondary | ICD-10-CM | POA: Diagnosis not present

## 2011-11-01 DIAGNOSIS — M25819 Other specified joint disorders, unspecified shoulder: Secondary | ICD-10-CM | POA: Diagnosis not present

## 2011-11-05 DIAGNOSIS — M47817 Spondylosis without myelopathy or radiculopathy, lumbosacral region: Secondary | ICD-10-CM | POA: Diagnosis not present

## 2011-11-05 DIAGNOSIS — M545 Low back pain, unspecified: Secondary | ICD-10-CM | POA: Diagnosis not present

## 2011-11-25 DIAGNOSIS — N401 Enlarged prostate with lower urinary tract symptoms: Secondary | ICD-10-CM | POA: Diagnosis not present

## 2011-11-25 DIAGNOSIS — N138 Other obstructive and reflux uropathy: Secondary | ICD-10-CM | POA: Diagnosis not present

## 2011-11-25 DIAGNOSIS — R972 Elevated prostate specific antigen [PSA]: Secondary | ICD-10-CM | POA: Diagnosis not present

## 2011-12-18 DIAGNOSIS — M545 Low back pain, unspecified: Secondary | ICD-10-CM | POA: Diagnosis not present

## 2011-12-23 DIAGNOSIS — L723 Sebaceous cyst: Secondary | ICD-10-CM | POA: Diagnosis not present

## 2011-12-23 DIAGNOSIS — L821 Other seborrheic keratosis: Secondary | ICD-10-CM | POA: Diagnosis not present

## 2011-12-23 DIAGNOSIS — Z85828 Personal history of other malignant neoplasm of skin: Secondary | ICD-10-CM | POA: Diagnosis not present

## 2012-01-02 DIAGNOSIS — M1710 Unilateral primary osteoarthritis, unspecified knee: Secondary | ICD-10-CM | POA: Diagnosis not present

## 2012-01-02 DIAGNOSIS — IMO0002 Reserved for concepts with insufficient information to code with codable children: Secondary | ICD-10-CM | POA: Diagnosis not present

## 2012-01-15 DIAGNOSIS — M19019 Primary osteoarthritis, unspecified shoulder: Secondary | ICD-10-CM | POA: Diagnosis not present

## 2012-01-22 DIAGNOSIS — G4700 Insomnia, unspecified: Secondary | ICD-10-CM | POA: Diagnosis not present

## 2012-01-22 DIAGNOSIS — R252 Cramp and spasm: Secondary | ICD-10-CM | POA: Diagnosis not present

## 2012-02-11 DIAGNOSIS — B351 Tinea unguium: Secondary | ICD-10-CM | POA: Diagnosis not present

## 2012-02-11 DIAGNOSIS — G479 Sleep disorder, unspecified: Secondary | ICD-10-CM | POA: Diagnosis not present

## 2012-02-25 DIAGNOSIS — H5200 Hypermetropia, unspecified eye: Secondary | ICD-10-CM | POA: Diagnosis not present

## 2012-02-25 DIAGNOSIS — H2510 Age-related nuclear cataract, unspecified eye: Secondary | ICD-10-CM | POA: Diagnosis not present

## 2012-02-25 DIAGNOSIS — H524 Presbyopia: Secondary | ICD-10-CM | POA: Diagnosis not present

## 2012-02-26 DIAGNOSIS — M545 Low back pain, unspecified: Secondary | ICD-10-CM | POA: Diagnosis not present

## 2012-03-09 DIAGNOSIS — M47817 Spondylosis without myelopathy or radiculopathy, lumbosacral region: Secondary | ICD-10-CM | POA: Diagnosis not present

## 2012-03-09 DIAGNOSIS — M545 Low back pain, unspecified: Secondary | ICD-10-CM | POA: Diagnosis not present

## 2012-03-25 DIAGNOSIS — H903 Sensorineural hearing loss, bilateral: Secondary | ICD-10-CM | POA: Diagnosis not present

## 2012-03-25 DIAGNOSIS — H905 Unspecified sensorineural hearing loss: Secondary | ICD-10-CM | POA: Diagnosis not present

## 2012-03-25 DIAGNOSIS — H9319 Tinnitus, unspecified ear: Secondary | ICD-10-CM | POA: Diagnosis not present

## 2012-04-14 ENCOUNTER — Other Ambulatory Visit: Payer: Self-pay | Admitting: Otolaryngology

## 2012-04-14 DIAGNOSIS — Z1388 Encounter for screening for disorder due to exposure to contaminants: Secondary | ICD-10-CM

## 2012-04-14 DIAGNOSIS — H9319 Tinnitus, unspecified ear: Secondary | ICD-10-CM

## 2012-04-14 DIAGNOSIS — H905 Unspecified sensorineural hearing loss: Secondary | ICD-10-CM

## 2012-04-14 DIAGNOSIS — H903 Sensorineural hearing loss, bilateral: Secondary | ICD-10-CM

## 2012-04-15 DIAGNOSIS — R51 Headache: Secondary | ICD-10-CM | POA: Diagnosis not present

## 2012-04-15 DIAGNOSIS — M542 Cervicalgia: Secondary | ICD-10-CM | POA: Diagnosis not present

## 2012-04-22 ENCOUNTER — Ambulatory Visit
Admission: RE | Admit: 2012-04-22 | Discharge: 2012-04-22 | Disposition: A | Discharge status: Home / Self Care | Payer: Medicare Other | Source: Ambulatory Visit | Attending: Otolaryngology | Admitting: Otolaryngology

## 2012-04-22 DIAGNOSIS — H9319 Tinnitus, unspecified ear: Secondary | ICD-10-CM

## 2012-04-22 DIAGNOSIS — Z135 Encounter for screening for eye and ear disorders: Secondary | ICD-10-CM | POA: Diagnosis not present

## 2012-04-22 DIAGNOSIS — Z1388 Encounter for screening for disorder due to exposure to contaminants: Secondary | ICD-10-CM

## 2012-04-22 DIAGNOSIS — H9190 Unspecified hearing loss, unspecified ear: Secondary | ICD-10-CM | POA: Diagnosis not present

## 2012-04-22 DIAGNOSIS — H905 Unspecified sensorineural hearing loss: Secondary | ICD-10-CM

## 2012-04-22 DIAGNOSIS — H903 Sensorineural hearing loss, bilateral: Secondary | ICD-10-CM

## 2012-04-22 MED ORDER — GADOBENATE DIMEGLUMINE 529 MG/ML IV SOLN: 19.0000 mL | Freq: Once | INTRAVENOUS | Status: AC | PRN

## 2012-04-23 DIAGNOSIS — M545 Low back pain, unspecified: Secondary | ICD-10-CM | POA: Diagnosis not present

## 2012-04-23 DIAGNOSIS — M5430 Sciatica, unspecified side: Secondary | ICD-10-CM | POA: Diagnosis not present

## 2012-04-27 DIAGNOSIS — B356 Tinea cruris: Secondary | ICD-10-CM | POA: Diagnosis not present

## 2012-04-27 DIAGNOSIS — G479 Sleep disorder, unspecified: Secondary | ICD-10-CM | POA: Diagnosis not present

## 2012-04-27 DIAGNOSIS — B351 Tinea unguium: Secondary | ICD-10-CM | POA: Diagnosis not present

## 2012-05-01 DIAGNOSIS — M25819 Other specified joint disorders, unspecified shoulder: Secondary | ICD-10-CM | POA: Diagnosis not present

## 2012-05-07 DIAGNOSIS — R51 Headache: Secondary | ICD-10-CM | POA: Diagnosis not present

## 2012-05-07 DIAGNOSIS — M5430 Sciatica, unspecified side: Secondary | ICD-10-CM | POA: Diagnosis not present

## 2012-05-07 DIAGNOSIS — M545 Low back pain, unspecified: Secondary | ICD-10-CM | POA: Diagnosis not present

## 2012-05-07 DIAGNOSIS — Z006 Encounter for examination for normal comparison and control in clinical research program: Secondary | ICD-10-CM | POA: Diagnosis not present

## 2012-05-12 ENCOUNTER — Ambulatory Visit (INDEPENDENT_AMBULATORY_CARE_PROVIDER_SITE_OTHER): Payer: Medicare Other | Admitting: Nurse Practitioner

## 2012-05-12 ENCOUNTER — Encounter: Payer: Self-pay | Admitting: Nurse Practitioner

## 2012-05-12 VITALS — BP 113/71 | HR 76 | Ht 74.5 in | Wt 204.5 lb

## 2012-05-12 DIAGNOSIS — G589 Mononeuropathy, unspecified: Secondary | ICD-10-CM

## 2012-05-12 DIAGNOSIS — G6289 Other specified polyneuropathies: Secondary | ICD-10-CM | POA: Insufficient documentation

## 2012-05-12 MED ORDER — GABAPENTIN 400 MG PO CAPS: 400.0000 mg | ORAL_CAPSULE | Freq: Four times a day (QID) | ORAL | Status: DC

## 2012-05-12 NOTE — Progress Notes (Signed)
HPI: Mr. 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 He is currently taking gabapentin 3 times daily without side effects however over the last month he has had more difficulty with his feet paresthesias and breakthrough pain during the day and at night. He denies any weakness gait difficulty, no falls ,no back pain although he does have a history of neck pain.   ROS: Ringing in the ears and hearing loss  Physical Exam General: well developed, well nourished, seated, in no evident distress Head: head normocephalic and atraumatic. Oropharynx benign Neck: supple with no carotid or supraclavicular bruits Cardiovascular: regular rate and rhythm, no murmurs  Neurologic Exam Mental Status: Awake and fully alert. Oriented to place and time. Recent and remote memory intact. Attention span, concentration and fund of knowledge appropriate. Mood and affect appropriate.  Cranial Nerves Pupils equal, briskly reactive to light. Extraocular movements full without nystagmus. Visual fields full to confrontation. Hearing intact and symmetric to finger snap. Facial sensation intact. Face, tongue, palate move normally and symmetrically. Neck flexion and extension normal.  Motor: Normal bulk and tone. Normal strength in all tested extremity muscles. Sensory.: intact to touch and pinprick and vibratory in upper and lower extremities.  Coordination: Rapid alternating movements normal in all extremities. Finger-to-nose and heel-to-shin performed accurately bilaterally. Gait and Station: Arises from chair without difficulty. Stance is normal. Gait demonstrates normal stride length and balance . Able to heel, toe and tandem walk without difficulty.  Reflexes: 1+ and symmetric. Toes downgoing.     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  Laboratory evaluation for neuropathy returned normal   PLAN: ncrease Neurontin 400 mg to 4 times a day Call for worsening of symptoms Followup in 6 months         Nilda Riggs, GNP-BC APRN

## 2012-05-12 NOTE — Patient Instructions (Addendum)
Increase gabapentin 400 mg to 4 times daily Prescription will be faxed to the pharmacy Call for worsening of symptoms Followup in 6 months

## 2012-05-25 DIAGNOSIS — B351 Tinea unguium: Secondary | ICD-10-CM | POA: Diagnosis not present

## 2012-05-29 DIAGNOSIS — M25519 Pain in unspecified shoulder: Secondary | ICD-10-CM | POA: Diagnosis not present

## 2012-06-10 DIAGNOSIS — R972 Elevated prostate specific antigen [PSA]: Secondary | ICD-10-CM | POA: Diagnosis not present

## 2012-06-12 DIAGNOSIS — S43429A Sprain of unspecified rotator cuff capsule, initial encounter: Secondary | ICD-10-CM | POA: Diagnosis not present

## 2012-06-12 DIAGNOSIS — M25519 Pain in unspecified shoulder: Secondary | ICD-10-CM | POA: Diagnosis not present

## 2012-06-17 DIAGNOSIS — N401 Enlarged prostate with lower urinary tract symptoms: Secondary | ICD-10-CM | POA: Diagnosis not present

## 2012-06-17 DIAGNOSIS — N138 Other obstructive and reflux uropathy: Secondary | ICD-10-CM | POA: Diagnosis not present

## 2012-06-17 DIAGNOSIS — R972 Elevated prostate specific antigen [PSA]: Secondary | ICD-10-CM | POA: Diagnosis not present

## 2012-06-18 ENCOUNTER — Ambulatory Visit (HOSPITAL_COMMUNITY)
Admission: RE | Admit: 2012-06-18 | Discharge: 2012-06-18 | Disposition: A | Discharge status: Home / Self Care | Payer: Medicare Other | Source: Ambulatory Visit | Attending: Specialist | Admitting: Specialist

## 2012-06-18 ENCOUNTER — Other Ambulatory Visit (HOSPITAL_COMMUNITY): Payer: Self-pay | Admitting: Specialist

## 2012-06-18 DIAGNOSIS — M25511 Pain in right shoulder: Secondary | ICD-10-CM

## 2012-06-18 DIAGNOSIS — M25512 Pain in left shoulder: Secondary | ICD-10-CM

## 2012-06-18 DIAGNOSIS — Z135 Encounter for screening for eye and ear disorders: Secondary | ICD-10-CM | POA: Diagnosis not present

## 2012-06-18 DIAGNOSIS — Z1389 Encounter for screening for other disorder: Secondary | ICD-10-CM | POA: Diagnosis not present

## 2012-06-29 DIAGNOSIS — M549 Dorsalgia, unspecified: Secondary | ICD-10-CM | POA: Diagnosis not present

## 2012-06-29 DIAGNOSIS — M47817 Spondylosis without myelopathy or radiculopathy, lumbosacral region: Secondary | ICD-10-CM | POA: Diagnosis not present

## 2012-06-30 DIAGNOSIS — M25519 Pain in unspecified shoulder: Secondary | ICD-10-CM | POA: Diagnosis not present

## 2012-07-01 DIAGNOSIS — B351 Tinea unguium: Secondary | ICD-10-CM | POA: Diagnosis not present

## 2012-07-01 DIAGNOSIS — G479 Sleep disorder, unspecified: Secondary | ICD-10-CM | POA: Diagnosis not present

## 2012-07-01 DIAGNOSIS — R41840 Attention and concentration deficit: Secondary | ICD-10-CM | POA: Diagnosis not present

## 2012-07-08 DIAGNOSIS — S43429A Sprain of unspecified rotator cuff capsule, initial encounter: Secondary | ICD-10-CM | POA: Diagnosis not present

## 2012-07-14 DIAGNOSIS — L570 Actinic keratosis: Secondary | ICD-10-CM | POA: Diagnosis not present

## 2012-07-21 DIAGNOSIS — G8918 Other acute postprocedural pain: Secondary | ICD-10-CM | POA: Diagnosis not present

## 2012-07-21 DIAGNOSIS — M24119 Other articular cartilage disorders, unspecified shoulder: Secondary | ICD-10-CM | POA: Diagnosis not present

## 2012-07-21 DIAGNOSIS — M75110 Incomplete rotator cuff tear or rupture of unspecified shoulder, not specified as traumatic: Secondary | ICD-10-CM | POA: Diagnosis not present

## 2012-07-21 DIAGNOSIS — M19019 Primary osteoarthritis, unspecified shoulder: Secondary | ICD-10-CM | POA: Diagnosis not present

## 2012-07-21 DIAGNOSIS — M75100 Unspecified rotator cuff tear or rupture of unspecified shoulder, not specified as traumatic: Secondary | ICD-10-CM | POA: Diagnosis not present

## 2012-07-21 DIAGNOSIS — S43499A Other sprain of unspecified shoulder joint, initial encounter: Secondary | ICD-10-CM | POA: Diagnosis not present

## 2012-07-21 DIAGNOSIS — S46819A Strain of other muscles, fascia and tendons at shoulder and upper arm level, unspecified arm, initial encounter: Secondary | ICD-10-CM | POA: Diagnosis not present

## 2012-07-21 DIAGNOSIS — M25819 Other specified joint disorders, unspecified shoulder: Secondary | ICD-10-CM | POA: Diagnosis not present

## 2012-07-29 DIAGNOSIS — M47817 Spondylosis without myelopathy or radiculopathy, lumbosacral region: Secondary | ICD-10-CM | POA: Diagnosis not present

## 2012-07-29 DIAGNOSIS — M549 Dorsalgia, unspecified: Secondary | ICD-10-CM | POA: Diagnosis not present

## 2012-07-30 DIAGNOSIS — Z9889 Other specified postprocedural states: Secondary | ICD-10-CM | POA: Diagnosis not present

## 2012-07-30 DIAGNOSIS — M25519 Pain in unspecified shoulder: Secondary | ICD-10-CM | POA: Diagnosis not present

## 2012-08-04 DIAGNOSIS — M25519 Pain in unspecified shoulder: Secondary | ICD-10-CM | POA: Diagnosis not present

## 2012-08-06 DIAGNOSIS — M25519 Pain in unspecified shoulder: Secondary | ICD-10-CM | POA: Diagnosis not present

## 2012-08-11 DIAGNOSIS — M25519 Pain in unspecified shoulder: Secondary | ICD-10-CM | POA: Diagnosis not present

## 2012-08-13 DIAGNOSIS — M25519 Pain in unspecified shoulder: Secondary | ICD-10-CM | POA: Diagnosis not present

## 2012-08-19 DIAGNOSIS — M25519 Pain in unspecified shoulder: Secondary | ICD-10-CM | POA: Diagnosis not present

## 2012-08-20 DIAGNOSIS — M25519 Pain in unspecified shoulder: Secondary | ICD-10-CM | POA: Diagnosis not present

## 2012-08-25 DIAGNOSIS — M259 Joint disorder, unspecified: Secondary | ICD-10-CM | POA: Diagnosis not present

## 2012-08-27 DIAGNOSIS — M259 Joint disorder, unspecified: Secondary | ICD-10-CM | POA: Diagnosis not present

## 2012-09-01 DIAGNOSIS — M259 Joint disorder, unspecified: Secondary | ICD-10-CM | POA: Diagnosis not present

## 2012-09-03 DIAGNOSIS — M259 Joint disorder, unspecified: Secondary | ICD-10-CM | POA: Diagnosis not present

## 2012-09-08 DIAGNOSIS — M259 Joint disorder, unspecified: Secondary | ICD-10-CM | POA: Diagnosis not present

## 2012-09-10 DIAGNOSIS — M259 Joint disorder, unspecified: Secondary | ICD-10-CM | POA: Diagnosis not present

## 2012-09-14 DIAGNOSIS — M542 Cervicalgia: Secondary | ICD-10-CM | POA: Diagnosis not present

## 2012-09-14 DIAGNOSIS — M4802 Spinal stenosis, cervical region: Secondary | ICD-10-CM | POA: Diagnosis not present

## 2012-09-14 DIAGNOSIS — M4712 Other spondylosis with myelopathy, cervical region: Secondary | ICD-10-CM | POA: Diagnosis not present

## 2012-09-15 DIAGNOSIS — M25519 Pain in unspecified shoulder: Secondary | ICD-10-CM | POA: Diagnosis not present

## 2012-09-17 DIAGNOSIS — M25519 Pain in unspecified shoulder: Secondary | ICD-10-CM | POA: Diagnosis not present

## 2012-09-21 DIAGNOSIS — M25519 Pain in unspecified shoulder: Secondary | ICD-10-CM | POA: Diagnosis not present

## 2012-09-22 DIAGNOSIS — M25519 Pain in unspecified shoulder: Secondary | ICD-10-CM | POA: Diagnosis not present

## 2012-09-23 ENCOUNTER — Other Ambulatory Visit: Payer: Self-pay

## 2012-09-23 MED ORDER — GABAPENTIN 400 MG PO CAPS: 400.0000 mg | ORAL_CAPSULE | Freq: Four times a day (QID) | ORAL | Status: DC

## 2012-09-24 DIAGNOSIS — M25519 Pain in unspecified shoulder: Secondary | ICD-10-CM | POA: Diagnosis not present

## 2012-09-29 DIAGNOSIS — M25519 Pain in unspecified shoulder: Secondary | ICD-10-CM | POA: Diagnosis not present

## 2012-10-01 DIAGNOSIS — M25519 Pain in unspecified shoulder: Secondary | ICD-10-CM | POA: Diagnosis not present

## 2012-10-02 ENCOUNTER — Other Ambulatory Visit: Payer: Self-pay | Admitting: Neurosurgery

## 2012-10-02 DIAGNOSIS — M4802 Spinal stenosis, cervical region: Secondary | ICD-10-CM

## 2012-10-06 DIAGNOSIS — M25519 Pain in unspecified shoulder: Secondary | ICD-10-CM | POA: Diagnosis not present

## 2012-10-07 ENCOUNTER — Ambulatory Visit
Admission: RE | Admit: 2012-10-07 | Discharge: 2012-10-07 | Disposition: A | Discharge status: Home / Self Care | Payer: Medicare Other | Source: Ambulatory Visit | Attending: Neurosurgery | Admitting: Neurosurgery

## 2012-10-07 DIAGNOSIS — M47812 Spondylosis without myelopathy or radiculopathy, cervical region: Secondary | ICD-10-CM | POA: Diagnosis not present

## 2012-10-07 DIAGNOSIS — M4802 Spinal stenosis, cervical region: Secondary | ICD-10-CM

## 2012-10-08 DIAGNOSIS — M25519 Pain in unspecified shoulder: Secondary | ICD-10-CM | POA: Diagnosis not present

## 2012-10-09 DIAGNOSIS — M4712 Other spondylosis with myelopathy, cervical region: Secondary | ICD-10-CM | POA: Diagnosis not present

## 2012-10-09 DIAGNOSIS — M542 Cervicalgia: Secondary | ICD-10-CM | POA: Diagnosis not present

## 2012-10-09 DIAGNOSIS — M4802 Spinal stenosis, cervical region: Secondary | ICD-10-CM | POA: Diagnosis not present

## 2012-10-12 ENCOUNTER — Other Ambulatory Visit: Payer: Self-pay | Admitting: Neurosurgery

## 2012-10-13 DIAGNOSIS — M25519 Pain in unspecified shoulder: Secondary | ICD-10-CM | POA: Diagnosis not present

## 2012-10-14 DIAGNOSIS — R609 Edema, unspecified: Secondary | ICD-10-CM | POA: Diagnosis not present

## 2012-10-14 DIAGNOSIS — B351 Tinea unguium: Secondary | ICD-10-CM | POA: Diagnosis not present

## 2012-10-14 DIAGNOSIS — G479 Sleep disorder, unspecified: Secondary | ICD-10-CM | POA: Diagnosis not present

## 2012-10-15 DIAGNOSIS — M25519 Pain in unspecified shoulder: Secondary | ICD-10-CM | POA: Diagnosis not present

## 2012-10-21 DIAGNOSIS — M1710 Unilateral primary osteoarthritis, unspecified knee: Secondary | ICD-10-CM | POA: Diagnosis not present

## 2012-10-21 DIAGNOSIS — M25519 Pain in unspecified shoulder: Secondary | ICD-10-CM | POA: Diagnosis not present

## 2012-10-21 DIAGNOSIS — IMO0002 Reserved for concepts with insufficient information to code with codable children: Secondary | ICD-10-CM | POA: Diagnosis not present

## 2012-10-21 NOTE — Pre-Procedure Instructions (Signed)
Dustin [name]  10/21/2012   Your procedure is scheduled on:  10/27/12  Report to [facility] at 945 AM.  Call this number if you have problems the morning of surgery: [phone]   Remember:   Do not eat food or drink liquids after midnight.   Take these medicines the morning of surgery with A SIP OF WATER: uroxatral,proscar,neurontin,percocet,protonix   Do not wear jewelry, make-up or nail polish.  Do not wear lotions, powders, or perfumes. You may wear deodorant.  Do not shave 48 hours prior to surgery. Men may shave face and neck.  Do not bring valuables to the hospital.  [facility] is not responsible                   for any belongings or valuables.  Contacts, dentures or bridgework may not be worn into surgery.  Leave suitcase in the car. After surgery it may be brought to your room.  For patients admitted to the hospital, checkout time is 11:00 AM the day of  discharge.   Patients discharged the day of surgery will not be allowed to drive  home.  Name and phone number of your driver: family  Special Instructions: Shower using CHG 2 nights before surgery and the night before surgery.  If you shower the day of surgery use CHG.  Use special wash - you have one bottle of CHG for all showers.  You should use approximately 1/3 of the bottle for each shower.   Please read over the following fact sheets that you were given: Pain Booklet, Coughing and Deep Breathing and Surgical Site Infection Prevention

## 2012-10-22 ENCOUNTER — Encounter (HOSPITAL_COMMUNITY): Payer: Self-pay

## 2012-10-22 ENCOUNTER — Encounter (HOSPITAL_COMMUNITY)
Admission: RE | Admit: 2012-10-22 | Discharge: 2012-10-22 | Disposition: A | Discharge status: Home / Self Care | Payer: Medicare Other | Source: Ambulatory Visit | Attending: Neurosurgery | Admitting: Neurosurgery

## 2012-10-22 DIAGNOSIS — Z01818 Encounter for other preprocedural examination: Secondary | ICD-10-CM | POA: Insufficient documentation

## 2012-10-22 DIAGNOSIS — Z01812 Encounter for preprocedural laboratory examination: Secondary | ICD-10-CM | POA: Insufficient documentation

## 2012-10-22 LAB — CBC
HCT: 38.4 % — ABNORMAL LOW (ref 39.0–52.0)
Hemoglobin: 13.2 g/dL (ref 13.0–17.0)
MCH: 29.6 pg (ref 26.0–34.0)
MCHC: 34.4 g/dL (ref 30.0–36.0)
MCV: 86.1 fL (ref 78.0–100.0)
Platelets: 136 10*3/uL — ABNORMAL LOW (ref 150–400)
RBC: 4.46 MIL/uL (ref 4.22–5.81)
RDW: 12.5 % (ref 11.5–15.5)
WBC: 6 10*3/uL (ref 4.0–10.5)

## 2012-10-22 LAB — BASIC METABOLIC PANEL
BUN: 15 mg/dL (ref 6–23)
CO2: 27 mEq/L (ref 19–32)
Calcium: 9.7 mg/dL (ref 8.4–10.5)
Chloride: 101 mEq/L (ref 96–112)
Creatinine, Ser: 0.93 mg/dL (ref 0.50–1.35)
GFR calc Af Amer: >90 mL/min (ref >90)
GFR calc non Af Amer: 83 mL/min — ABNORMAL LOW (ref >90)
Glucose, Bld: 110 mg/dL — ABNORMAL HIGH (ref 70–99)
Potassium: 4.1 mEq/L (ref 3.5–5.1)
Sodium: 138 mEq/L (ref 135–145)

## 2012-10-22 LAB — SURGICAL PCR SCREEN
MRSA, PCR: NEGATIVE
Staphylococcus aureus: NEGATIVE

## 2012-10-26 MED ORDER — CEFAZOLIN SODIUM-DEXTROSE 2-3 GM-% IV SOLR
2.0000 g | INTRAVENOUS | Status: AC
  Administered 2012-10-27: 2 g via INTRAVENOUS
  Filled 2012-10-26: qty 50

## 2012-10-26 NOTE — H&P (Signed)
OUTPATIENT OFFICE NOTE    HOPI: Bates Collington returns today. I reviewed his cervical radiographs and cervical MRI scan, which show multilevel disc degeneration, severe spondylosis and severe deterioration in cervical spine with cord compression principally at C5-6, but also at C3-4, C4-5, and C6-7 levels. He has biforaminal stenosis and he says he is miserable in terms of his level of neck pain. He cannot get any relief. He wants to go ahead with surgery. IMPRESSION/PLAN: We reviewed his x-rays and MRI in detail. He wishes to proceed with surgery. This would consist of anterior cervical decompression and fusion at the C3-4, C4-5, C5-6, and C6-7 levels. Risks and benefits were discussed with the patient and he wishes to proceed. We have tried everything from a nonsurgical option to give him some relief. I met with the patient, went over models and teaching and then Brain Thornton also did so. We also went over his radiographs and MRI. Risks and benefits were discussed in detail with the patient and he wishes to proceed. _________________________________  Danae Orleans Venetia Maxon, MD    OUTPATIENT OFFICE NOTE CHIEF COMPLAINT: Jasyah Theurer returns today. He has been working with Dr. Ollen Bowl on a regular basis for injections in his low-back, and he says these have been quite helpful but he continues to complain of significant neck pain and says he is very limited in terms of rotation, flexibility, and is not having good relief of his neck pain despite nonsurgical interventions.  HOPI: Of note, he had cervical spondylosis and canal stenosis with a 6 mm spinal canal when he first came to see me. We have been doing six-monthly evaluations to rule out myelopathy. He recently underwent rotator cuff surgery and has been wearing a sling on his left arm.  PHYSICAL EXAMINATION: On examination today he does not show evidence of significant hyperreflexia or weakness, although he perhaps has some mild hand intrinsic  weakness on the right. He has negative Hoffmann signs and does not have particular hyper-reflexia in his lower extremities. IMPRESSION/PLAN: In light of the fact that he is having persistent unrelenting neck pain, I have recommended we reassess his cervical spine, including cervical MRI and plain radiographs. I will see him back after these studies have been performed and will make further recommendations at that point.  _________________________________  Danae Orleans. Venetia Maxon, MD    NEUROSURGICAL CONSULTATION Zyan Coby #[id number] DOB: 05-08-41 August 03, 2007 HISTORY: Gregory Barrick is a 72 year old retired, left-handed Man who presents at the request of Dr. Lajean Manes for right sided neck and right arm pain. He currently describes neck pain as 2/10. Right arm pain is not bothering him at the present time. It has gotten better recently. He says this has been going on for two months. He denies numbness, tingling or weakness. He says he has pain only in the afternoon and evening and that it is intermittent. He has occasional pain behind his right ear and right trapezius into the right arm. He has been taking Flexeril 10 mg., Celebrex 200 mg. q.h.s. He has a history of skin cancer and arthritis.  REVIEW OF SYSTEMS: A detailed Review of Systems sheet was reviewed with the patient. Pertinent positives includeglasses, arthritis and skin cancer. All other systems are negative; this includes Constitutional symptoms, Cardiovascular, Ears, nose, mouth, throat, Endocrine, Respiratory, Gastrointestinal, Genitourinary, Breast, Neurologic, Psychiatric, Hematologic/Lymphatic, Allergic/Immunologic.  PAST MEDICAL HISTORY:   Current Medical Conditions: As previously described.   Prior Operations and Hospitalizations: Significant for years ago he had tonsillectomy.  Medications  and Allergies: He is taking Protonix 40 mg. q.d., Doxazosin Mesylate 4 mg., Finasteride 5 mg. Celebrex 200 mg. and Cyclobenzaprine 10 mg. He  is also taking Multivitamins, aspirin 81 mg., Vitamin D 1000 IU and Nu-Iron 150 mg. q.d. No known drug allergies.   Height and Weight: 6'2". 212 pounds.  FAMILY HISTORY: Unremarkable.  SOCIAL HISTORY: He was formerly a smoker but stopped in 1998. He is an alcoholic and stopped drinking in 1980.  DIAGNOSTIC STUDIES: He had an MRI of his cervical spine performed at [facility] on 07/19/08 which was noteworthy for significant spinal stenosis at the C5-6 level with 6 mm spinal canal AP diameter with prominent biforaminal stenosis. No evidence of cord signal. At C6-7 he has a 9 mm spinal canal and at C4-5 he has a 9 mm spinal canal. He has right sided facet arthropathy at the C3-4 level.  PHYSICAL EXAMINATION:   General Appearance: On examination today, Mr. Matsuo is a pleasant and cooperative man in no acute distress.   Blood Pressure, Pulse: 118/62. Heart rate 76 and regular.   HEENT - normocephalic, atraumatic. The pupils are equal, round and reactive to light. The extraocular muscles are intact. Sclerae - white. Conjunctiva - pink. Oropharynx benign. Uvula midline.  Neck - There are no masses, meningismus, deformities, tracheal deviation, jugular vein distention or carotid bruits. There is normal cervical range of motion. Spurlings' test is mildly positive to the right. Lhermitte's sign is not present with axial compression. Fairly good range of motion of the cervical spine.  Respiratory - there is normal respiratory effort with good intercostal function. Lungs are clear to auscultation. There are no rales, rhonchi or wheezes.   Cardiovascular - the heart has regular rate and rhythm to auscultation. No murmurs are appreciated. There is no extremity edema, cyanosis or clubbing. There are palpable pedal pulses.   Abdomen - soft, nontender, no hepatosplenomegaly appreciated or masses. There are active bowel sounds. No guarding or rebound.   Musculoskeletal Examination - he has a mildly  positive right shoulder impingement testing. NEUROLOGICAL EXAMINATION: The patient is oriented to time, person and place and has good recall of both recent and remote memory with normal attention span and concentration. The patient speaks with clear and fluent speech and exhibits normal language function and appropriate fund of knowledge.   Cranial Nerve Examination - pupils are equal, round and reactive to light. Extraocular movements are full. Visual fields are full to confrontational testing. Facial sensation and facial movement are symmetric and intact. Hearing is intact to finger rub. Palate is upgoing. Shoulder shrug is symmetric. Tongue protrudes in the midline.   Motor Examination - motor strength is 5/5 in the bilateral deltoids, biceps, triceps, handgrips, wrist extensors, interosseous. In the lower extremities motor strength is 5/5 in hip flexion, extension, quadriceps, hamstrings, plantar flexion, dorsiflexion and extensor hallucis longus.   Sensory Examination - normal to light touch and pinprick sensation in the upper and lower extremities.  Deep Tendon Reflexes - 2 in the biceps, triceps, and brachioradialis, 2 in the knees, 2 in the ankles. The great toes are downgoing to plantar stimulation. No pathologic reflexes.   Cerebellar Examination - normal coordination in upper and lower extremities and normal rapid alternating movements. Romberg test is negative.  IMPRESSION AND RECOMMENDATIONS: Mung Rinker is a 72 year old man with cervical stenosis with AP diameter of the cervical spinal canal of 6 mm. He has essentially a normal neurologic examination. I talked to him about the potential development of myelopathy  and what to look for. I do think he has a component of a rotator cuff syndrome with positive impingement in the right shoulder. I am going to see him back in three months to make sure he is not developing signs of myelopathy and then will see him back on a regular basis for  monitoring. He is aware of potentially increased risks of spinal cord injury if he were to fall or hit his head.  [facility] Danae Orleans. Venetia Maxon, M.D.

## 2012-10-27 ENCOUNTER — Inpatient Hospital Stay (HOSPITAL_COMMUNITY)
Admission: RE | Admit: 2012-10-27 | Discharge: 2012-10-30 | DRG: 473 | Disposition: A | Discharge status: Home / Self Care | Payer: Medicare Other | Source: Ambulatory Visit | Attending: Neurosurgery | Admitting: Neurosurgery

## 2012-10-27 ENCOUNTER — Inpatient Hospital Stay (HOSPITAL_COMMUNITY): Payer: Medicare Other | Admitting: Certified Registered Nurse Anesthetist

## 2012-10-27 ENCOUNTER — Encounter (HOSPITAL_COMMUNITY): Payer: Self-pay | Admitting: *Deleted

## 2012-10-27 ENCOUNTER — Inpatient Hospital Stay (HOSPITAL_COMMUNITY): Payer: Medicare Other

## 2012-10-27 ENCOUNTER — Encounter (HOSPITAL_COMMUNITY)
Admission: RE | Disposition: A | Discharge status: Home / Self Care | Payer: Self-pay | Source: Ambulatory Visit | Attending: Neurosurgery

## 2012-10-27 ENCOUNTER — Encounter (HOSPITAL_COMMUNITY): Payer: Self-pay | Admitting: Certified Registered Nurse Anesthetist

## 2012-10-27 DIAGNOSIS — K219 Gastro-esophageal reflux disease without esophagitis: Secondary | ICD-10-CM | POA: Diagnosis not present

## 2012-10-27 DIAGNOSIS — M5412 Radiculopathy, cervical region: Secondary | ICD-10-CM | POA: Diagnosis not present

## 2012-10-27 DIAGNOSIS — M542 Cervicalgia: Secondary | ICD-10-CM | POA: Diagnosis not present

## 2012-10-27 DIAGNOSIS — M4712 Other spondylosis with myelopathy, cervical region: Secondary | ICD-10-CM | POA: Diagnosis not present

## 2012-10-27 DIAGNOSIS — M47812 Spondylosis without myelopathy or radiculopathy, cervical region: Secondary | ICD-10-CM | POA: Diagnosis not present

## 2012-10-27 DIAGNOSIS — M4802 Spinal stenosis, cervical region: Secondary | ICD-10-CM | POA: Diagnosis not present

## 2012-10-27 HISTORY — PX: ANTERIOR CERVICAL DECOMPRESSION/DISCECTOMY FUSION 4 LEVELS: SHX5556

## 2012-10-27 LAB — GLUCOSE, CAPILLARY: Glucose-Capillary: 146 mg/dL — ABNORMAL HIGH (ref 70–99)

## 2012-10-27 SURGERY — ANTERIOR CERVICAL DECOMPRESSION/DISCECTOMY FUSION 4 LEVELS
Anesthesia: General | Wound class: Clean

## 2012-10-27 MED ORDER — NEOSTIGMINE METHYLSULFATE 1 MG/ML IJ SOLN
INTRAMUSCULAR | Status: DC | PRN
  Administered 2012-10-27: 5 mg via INTRAVENOUS

## 2012-10-27 MED ORDER — TRAZODONE HCL 100 MG PO TABS
100.0000 mg | ORAL_TABLET | Freq: Every day | ORAL | Status: DC
  Administered 2012-10-27 – 2012-10-30 (×4): 100 mg via ORAL
  Filled 2012-10-27 (×4): qty 1

## 2012-10-27 MED ORDER — OXYCODONE HCL 5 MG/5ML PO SOLN: 5.0000 mg | Freq: Once | ORAL | Status: DC | PRN

## 2012-10-27 MED ORDER — ONDANSETRON HCL 4 MG/2ML IJ SOLN
INTRAMUSCULAR | Status: DC | PRN
  Administered 2012-10-27: 4 mg via INTRAVENOUS

## 2012-10-27 MED ORDER — GLYCOPYRROLATE 0.2 MG/ML IJ SOLN
INTRAMUSCULAR | Status: DC | PRN
  Administered 2012-10-27: .8 mg via INTRAVENOUS

## 2012-10-27 MED ORDER — HYDROMORPHONE HCL PF 1 MG/ML IJ SOLN
0.2500 mg | INTRAMUSCULAR | Status: DC | PRN
  Administered 2012-10-27 (×4): 0.5 mg via INTRAVENOUS

## 2012-10-27 MED ORDER — DIAZEPAM 5 MG/ML IJ SOLN
2.5000 mg | Freq: Once | INTRAMUSCULAR | Status: AC
  Administered 2012-10-27: 2.5 mg via INTRAVENOUS

## 2012-10-27 MED ORDER — DEXMEDETOMIDINE HCL IN NACL 200 MCG/50ML IV SOLN
0.4000 ug/kg/h | INTRAVENOUS | Status: AC
  Administered 2012-10-27: 0.299 ug/kg/h via INTRAVENOUS
  Filled 2012-10-27: qty 50

## 2012-10-27 MED ORDER — VITAMIN D3 25 MCG (1000 UNIT) PO TABS
2000.0000 [IU] | ORAL_TABLET | Freq: Every day | ORAL | Status: DC
  Administered 2012-10-27 – 2012-10-30 (×4): 2000 [IU] via ORAL
  Filled 2012-10-27 (×4): qty 2

## 2012-10-27 MED ORDER — DIAZEPAM 5 MG PO TABS
5.0000 mg | ORAL_TABLET | Freq: Four times a day (QID) | ORAL | Status: DC | PRN
  Administered 2012-10-29: 10 mg via ORAL
  Administered 2012-10-29 – 2012-10-30 (×2): 5 mg via ORAL
  Filled 2012-10-27: qty 2
  Filled 2012-10-27 (×2): qty 1

## 2012-10-27 MED ORDER — KCL IN DEXTROSE-NACL 20-5-0.45 MEQ/L-%-% IV SOLN
INTRAVENOUS | Status: DC
  Administered 2012-10-27 – 2012-10-29 (×3): via INTRAVENOUS
  Filled 2012-10-27 (×7): qty 1000

## 2012-10-27 MED ORDER — PANTOPRAZOLE SODIUM 40 MG PO TBEC
40.0000 mg | DELAYED_RELEASE_TABLET | Freq: Every day | ORAL | Status: DC
  Administered 2012-10-27 – 2012-10-30 (×4): 40 mg via ORAL
  Filled 2012-10-27 (×4): qty 1

## 2012-10-27 MED ORDER — METOCLOPRAMIDE HCL 5 MG/ML IJ SOLN: 10.0000 mg | Freq: Once | INTRAMUSCULAR | Status: DC | PRN

## 2012-10-27 MED ORDER — THROMBIN 5000 UNITS EX SOLR
OROMUCOSAL | Status: DC | PRN
  Administered 2012-10-27: 14:00:00 via TOPICAL

## 2012-10-27 MED ORDER — LACTATED RINGERS IV SOLN
INTRAVENOUS | Status: DC | PRN
  Administered 2012-10-27 (×3): via INTRAVENOUS

## 2012-10-27 MED ORDER — DIAZEPAM 5 MG PO TABS
5.0000 mg | ORAL_TABLET | Freq: Four times a day (QID) | ORAL | Status: DC | PRN
  Administered 2012-10-28: 5 mg via ORAL
  Filled 2012-10-27: qty 1

## 2012-10-27 MED ORDER — CEFAZOLIN SODIUM 1-5 GM-% IV SOLN
1.0000 g | Freq: Three times a day (TID) | INTRAVENOUS | Status: AC
  Administered 2012-10-27 – 2012-10-28 (×2): 1 g via INTRAVENOUS
  Filled 2012-10-27 (×2): qty 50

## 2012-10-27 MED ORDER — OXYCODONE-ACETAMINOPHEN 10-325 MG PO TABS: 10.0000 | ORAL_TABLET | ORAL | Status: DC | PRN

## 2012-10-27 MED ORDER — DIAZEPAM 5 MG PO TABS
ORAL_TABLET | ORAL | Status: AC
  Administered 2012-10-27: 5 mg
  Filled 2012-10-27: qty 1

## 2012-10-27 MED ORDER — METHOCARBAMOL 100 MG/ML IJ SOLN
500.0000 mg | Freq: Four times a day (QID) | INTRAVENOUS | Status: DC | PRN
  Filled 2012-10-27: qty 5

## 2012-10-27 MED ORDER — POLYETHYLENE GLYCOL 3350 17 G PO PACK
17.0000 g | PACK | Freq: Every day | ORAL | Status: DC | PRN
  Filled 2012-10-27: qty 1

## 2012-10-27 MED ORDER — ACETAMINOPHEN 10 MG/ML IV SOLN
INTRAVENOUS | Status: AC
  Administered 2012-10-27: 1000 mg
  Filled 2012-10-27: qty 100

## 2012-10-27 MED ORDER — FLEET ENEMA 7-19 GM/118ML RE ENEM
1.0000 | ENEMA | Freq: Once | RECTAL | Status: AC | PRN
  Filled 2012-10-27: qty 1

## 2012-10-27 MED ORDER — DIAZEPAM 5 MG/ML IJ SOLN
INTRAMUSCULAR | Status: AC
  Administered 2012-10-27: 2.5 mg
  Filled 2012-10-27: qty 2

## 2012-10-27 MED ORDER — HYDROMORPHONE HCL PF 1 MG/ML IJ SOLN
INTRAMUSCULAR | Status: AC
  Filled 2012-10-27: qty 1

## 2012-10-27 MED ORDER — OXYCODONE HCL 5 MG PO TABS
ORAL_TABLET | ORAL | Status: AC
  Administered 2012-10-27: 5 mg
  Filled 2012-10-27: qty 1

## 2012-10-27 MED ORDER — METHOCARBAMOL 100 MG/ML IJ SOLN
500.0000 mg | Freq: Four times a day (QID) | INTRAMUSCULAR | Status: DC | PRN
  Filled 2012-10-27: qty 5

## 2012-10-27 MED ORDER — HYDROCODONE-ACETAMINOPHEN 5-325 MG PO TABS
1.0000 | ORAL_TABLET | ORAL | Status: DC | PRN
  Administered 2012-10-27 – 2012-10-30 (×3): 2 via ORAL
  Filled 2012-10-27 (×3): qty 2

## 2012-10-27 MED ORDER — OXYCODONE-ACETAMINOPHEN 5-325 MG PO TABS
1.0000 | ORAL_TABLET | ORAL | Status: DC | PRN
  Administered 2012-10-28 – 2012-10-29 (×3): 1 via ORAL
  Filled 2012-10-27 (×4): qty 1

## 2012-10-27 MED ORDER — ACETAMINOPHEN 650 MG RE SUPP: 650.0000 mg | RECTAL | Status: DC | PRN

## 2012-10-27 MED ORDER — 0.9 % SODIUM CHLORIDE (POUR BTL) OPTIME
TOPICAL | Status: DC | PRN
  Administered 2012-10-27: 1000 mL

## 2012-10-27 MED ORDER — HYDROMORPHONE HCL 2 MG PO TABS
2.0000 mg | ORAL_TABLET | ORAL | Status: DC | PRN
  Administered 2012-10-27 – 2012-10-30 (×5): 4 mg via ORAL
  Filled 2012-10-27: qty 2
  Filled 2012-10-27: qty 1
  Filled 2012-10-27 (×3): qty 2
  Filled 2012-10-27: qty 1

## 2012-10-27 MED ORDER — INFLUENZA VAC SPLIT QUAD 0.5 ML IM SUSP
0.5000 mL | INTRAMUSCULAR | Status: AC
  Administered 2012-10-28: 0.5 mL via INTRAMUSCULAR
  Filled 2012-10-27 (×2): qty 0.5

## 2012-10-27 MED ORDER — VECURONIUM BROMIDE 10 MG IV SOLR
INTRAVENOUS | Status: DC | PRN
  Administered 2012-10-27: 2 mg via INTRAVENOUS
  Administered 2012-10-27: 1 mg via INTRAVENOUS

## 2012-10-27 MED ORDER — HYDROMORPHONE HCL PF 1 MG/ML IJ SOLN
0.5000 mg | INTRAMUSCULAR | Status: DC | PRN
  Administered 2012-10-28 (×3): 1 mg via INTRAVENOUS
  Filled 2012-10-27 (×3): qty 1

## 2012-10-27 MED ORDER — LIDOCAINE-EPINEPHRINE 1 %-1:100000 IJ SOLN
INTRAMUSCULAR | Status: DC | PRN
  Administered 2012-10-27: 5 mL via INTRADERMAL

## 2012-10-27 MED ORDER — PROPOFOL 10 MG/ML IV BOLUS
INTRAVENOUS | Status: DC | PRN
  Administered 2012-10-27: 180 mg via INTRAVENOUS

## 2012-10-27 MED ORDER — OXYCODONE-ACETAMINOPHEN 5-325 MG PO TABS: 1.0000 | ORAL_TABLET | ORAL | Status: DC | PRN

## 2012-10-27 MED ORDER — DIAZEPAM 5 MG/ML IJ SOLN: 2.5000 mg | Freq: Once | INTRAMUSCULAR | Status: DC

## 2012-10-27 MED ORDER — DEXAMETHASONE SODIUM PHOSPHATE 10 MG/ML IJ SOLN
INTRAMUSCULAR | Status: DC | PRN
  Administered 2012-10-27: 10 mg via INTRAVENOUS

## 2012-10-27 MED ORDER — DOCUSATE SODIUM 100 MG PO CAPS
100.0000 mg | ORAL_CAPSULE | Freq: Two times a day (BID) | ORAL | Status: DC
  Administered 2012-10-27 – 2012-10-30 (×6): 100 mg via ORAL
  Filled 2012-10-27 (×6): qty 1

## 2012-10-27 MED ORDER — DEXMEDETOMIDINE HCL IN NACL 200 MCG/50ML IV SOLN
0.3000 ug/kg/h | INTRAVENOUS | Status: DC
  Administered 2012-10-27 – 2012-10-28 (×2): 0.3 ug/kg/h via INTRAVENOUS
  Filled 2012-10-27 (×2): qty 50

## 2012-10-27 MED ORDER — ALFUZOSIN HCL ER 10 MG PO TB24
10.0000 mg | ORAL_TABLET | Freq: Every day | ORAL | Status: DC
  Administered 2012-10-27 – 2012-10-30 (×4): 10 mg via ORAL
  Filled 2012-10-27 (×4): qty 1

## 2012-10-27 MED ORDER — MORPHINE SULFATE 2 MG/ML IJ SOLN
1.0000 mg | INTRAMUSCULAR | Status: DC | PRN
  Administered 2012-10-27 – 2012-10-29 (×9): 4 mg via INTRAVENOUS
  Filled 2012-10-27 (×6): qty 2
  Filled 2012-10-27: qty 1
  Filled 2012-10-27 (×2): qty 2

## 2012-10-27 MED ORDER — SODIUM CHLORIDE 0.9 % IJ SOLN: 3.0000 mL | INTRAMUSCULAR | Status: DC | PRN

## 2012-10-27 MED ORDER — ROCURONIUM BROMIDE 100 MG/10ML IV SOLN
INTRAVENOUS | Status: DC | PRN
  Administered 2012-10-27: 50 mg via INTRAVENOUS

## 2012-10-27 MED ORDER — ACETAMINOPHEN 325 MG PO TABS: 650.0000 mg | ORAL_TABLET | ORAL | Status: DC | PRN

## 2012-10-27 MED ORDER — ARTIFICIAL TEARS OP OINT
TOPICAL_OINTMENT | OPHTHALMIC | Status: DC | PRN
  Administered 2012-10-27: 1 via OPHTHALMIC

## 2012-10-27 MED ORDER — PANTOPRAZOLE SODIUM 40 MG IV SOLR
40.0000 mg | Freq: Every day | INTRAVENOUS | Status: DC
  Administered 2012-10-27: 40 mg via INTRAVENOUS
  Filled 2012-10-27 (×2): qty 40

## 2012-10-27 MED ORDER — HYDROMORPHONE HCL PF 1 MG/ML IJ SOLN
INTRAMUSCULAR | Status: AC
  Administered 2012-10-27: 0.5 mg
  Filled 2012-10-27: qty 1

## 2012-10-27 MED ORDER — BUPIVACAINE HCL (PF) 0.5 % IJ SOLN
INTRAMUSCULAR | Status: DC | PRN
  Administered 2012-10-27: 5 mL

## 2012-10-27 MED ORDER — DEXMEDETOMIDINE BOLUS VIA INFUSION: 1.0000 ug/kg | Freq: Once | INTRAVENOUS | Status: DC

## 2012-10-27 MED ORDER — ONDANSETRON HCL 4 MG/2ML IJ SOLN: 4.0000 mg | INTRAMUSCULAR | Status: DC | PRN

## 2012-10-27 MED ORDER — ALUM & MAG HYDROXIDE-SIMETH 200-200-20 MG/5ML PO SUSP: 30.0000 mL | Freq: Four times a day (QID) | ORAL | Status: DC | PRN

## 2012-10-27 MED ORDER — SODIUM CHLORIDE 0.9 % IJ SOLN
3.0000 mL | Freq: Two times a day (BID) | INTRAMUSCULAR | Status: DC
  Administered 2012-10-27 – 2012-10-29 (×2): 3 mL via INTRAVENOUS

## 2012-10-27 MED ORDER — FINASTERIDE 5 MG PO TABS
5.0000 mg | ORAL_TABLET | Freq: Every day | ORAL | Status: DC
  Administered 2012-10-27 – 2012-10-30 (×4): 5 mg via ORAL
  Filled 2012-10-27 (×4): qty 1

## 2012-10-27 MED ORDER — FENTANYL CITRATE 0.05 MG/ML IJ SOLN
INTRAMUSCULAR | Status: DC | PRN
  Administered 2012-10-27: 50 ug via INTRAVENOUS
  Administered 2012-10-27: 100 ug via INTRAVENOUS
  Administered 2012-10-27 (×5): 50 ug via INTRAVENOUS

## 2012-10-27 MED ORDER — PHENOL 1.4 % MT LIQD: 1.0000 | OROMUCOSAL | Status: DC | PRN

## 2012-10-27 MED ORDER — MENTHOL 3 MG MT LOZG: 1.0000 | LOZENGE | OROMUCOSAL | Status: DC | PRN

## 2012-10-27 MED ORDER — MIDAZOLAM HCL 2 MG/2ML IJ SOLN
INTRAMUSCULAR | Status: AC
  Administered 2012-10-27: 1 mg
  Filled 2012-10-27: qty 2

## 2012-10-27 MED ORDER — ZOLPIDEM TARTRATE 5 MG PO TABS: 5.0000 mg | ORAL_TABLET | Freq: Every evening | ORAL | Status: DC | PRN

## 2012-10-27 MED ORDER — THROMBIN 20000 UNITS EX SOLR
CUTANEOUS | Status: DC | PRN
  Administered 2012-10-27: 13:00:00 via TOPICAL

## 2012-10-27 MED ORDER — BISACODYL 10 MG RE SUPP: 10.0000 mg | Freq: Every day | RECTAL | Status: DC | PRN

## 2012-10-27 MED ORDER — MIDAZOLAM HCL 2 MG/2ML IJ SOLN: 2.0000 mg | Freq: Once | INTRAMUSCULAR | Status: DC

## 2012-10-27 MED ORDER — SENNA 8.6 MG PO TABS
1.0000 | ORAL_TABLET | Freq: Two times a day (BID) | ORAL | Status: DC
  Administered 2012-10-27 – 2012-10-30 (×6): 8.6 mg via ORAL
  Filled 2012-10-27 (×7): qty 1

## 2012-10-27 MED ORDER — GABAPENTIN 400 MG PO CAPS
400.0000 mg | ORAL_CAPSULE | Freq: Four times a day (QID) | ORAL | Status: DC
  Administered 2012-10-27 – 2012-10-30 (×9): 400 mg via ORAL
  Filled 2012-10-27 (×13): qty 1

## 2012-10-27 MED ORDER — LIDOCAINE HCL (CARDIAC) 20 MG/ML IV SOLN
INTRAVENOUS | Status: DC | PRN
  Administered 2012-10-27: 50 mg via INTRAVENOUS

## 2012-10-27 MED ORDER — OXYCODONE HCL 5 MG PO TABS
5.0000 mg | ORAL_TABLET | ORAL | Status: DC | PRN
  Administered 2012-10-28: 5 mg via ORAL
  Filled 2012-10-27: qty 1

## 2012-10-27 MED ORDER — LACTATED RINGERS IV SOLN
INTRAVENOUS | Status: DC
  Administered 2012-10-27: 11:00:00 via INTRAVENOUS

## 2012-10-27 MED ORDER — SODIUM CHLORIDE 0.9 % IV SOLN: 250.0000 mL | INTRAVENOUS | Status: DC

## 2012-10-27 MED ORDER — MIDAZOLAM HCL 5 MG/5ML IJ SOLN
INTRAMUSCULAR | Status: DC | PRN
  Administered 2012-10-27: 2 mg via INTRAVENOUS

## 2012-10-27 MED ORDER — EPHEDRINE SULFATE 50 MG/ML IJ SOLN
INTRAMUSCULAR | Status: DC | PRN
  Administered 2012-10-27 (×2): 2.5 mg via INTRAVENOUS
  Administered 2012-10-27: 5 mg via INTRAVENOUS
  Administered 2012-10-27: 2.5 mg via INTRAVENOUS

## 2012-10-27 MED ORDER — ACETAMINOPHEN NICU IV SYRINGE 10 MG/ML: 15.0000 mg/kg | Freq: Four times a day (QID) | INTRAVENOUS | Status: DC

## 2012-10-27 MED ORDER — OXYCODONE HCL 5 MG PO TABS: 5.0000 mg | ORAL_TABLET | Freq: Once | ORAL | Status: DC | PRN

## 2012-10-27 SURGICAL SUPPLY — 75 items
ADH SKN CLS APL DERMABOND .7 (GAUZE/BANDAGES/DRESSINGS) ×1
APL SKNCLS STERI-STRIP NONHPOA (GAUZE/BANDAGES/DRESSINGS)
BAG DECANTER FOR FLEXI CONT (MISCELLANEOUS) ×2 IMPLANT
BANDAGE GAUZE ELAST BULKY 4 IN (GAUZE/BANDAGES/DRESSINGS) ×2 IMPLANT
BENZOIN TINCTURE PRP APPL 2/3 (GAUZE/BANDAGES/DRESSINGS) IMPLANT
BIT DRILL 14MM (INSTRUMENTS) IMPLANT
BIT DRILL NEURO 2X3.1 SFT TUCH (MISCELLANEOUS) ×1 IMPLANT
BLADE ULTRA TIP 2M (BLADE) ×1 IMPLANT
BUR BARREL STRAIGHT FLUTE 4.0 (BURR) ×2 IMPLANT
CANISTER SUCTION 2500CC (MISCELLANEOUS) ×2 IMPLANT
CLOTH BEACON ORANGE TIMEOUT ST (SAFETY) ×2 IMPLANT
CONT SPEC 4OZ CLIKSEAL STRL BL (MISCELLANEOUS) ×2 IMPLANT
COVER MAYO STAND STRL (DRAPES) ×2 IMPLANT
DERMABOND ADVANCED (GAUZE/BANDAGES/DRESSINGS) ×1
DERMABOND ADVANCED .7 DNX12 (GAUZE/BANDAGES/DRESSINGS) ×1 IMPLANT
DRAIN JACKSON PRATT 10MM FLAT (MISCELLANEOUS) ×1 IMPLANT
DRAPE LAPAROTOMY 100X72 PEDS (DRAPES) ×2 IMPLANT
DRAPE MICROSCOPE LEICA (MISCELLANEOUS) ×2 IMPLANT
DRAPE POUCH INSTRU U-SHP 10X18 (DRAPES) ×2 IMPLANT
DRAPE PROXIMA HALF (DRAPES) IMPLANT
DRESSING TELFA 8X3 (GAUZE/BANDAGES/DRESSINGS) IMPLANT
DRILL 14MM (INSTRUMENTS) ×2
DRILL NEURO 2X3.1 SOFT TOUCH (MISCELLANEOUS) ×2
DRSG OPSITE POSTOP 4X6 (GAUZE/BANDAGES/DRESSINGS) ×1 IMPLANT
DURAPREP 6ML APPLICATOR 50/CS (WOUND CARE) ×2 IMPLANT
ELECT COATED BLADE 2.86 ST (ELECTRODE) ×2 IMPLANT
ELECT REM PT RETURN 9FT ADLT (ELECTROSURGICAL) ×2
ELECTRODE REM PT RTRN 9FT ADLT (ELECTROSURGICAL) ×1 IMPLANT
EVACUATOR SILICONE 100CC (DRAIN) ×1 IMPLANT
GAUZE SPONGE 4X4 16PLY XRAY LF (GAUZE/BANDAGES/DRESSINGS) IMPLANT
GLOVE BIO SURGEON STRL SZ8 (GLOVE) ×2 IMPLANT
GLOVE BIOGEL PI IND STRL 8 (GLOVE) ×1 IMPLANT
GLOVE BIOGEL PI IND STRL 8.5 (GLOVE) ×1 IMPLANT
GLOVE BIOGEL PI INDICATOR 8 (GLOVE) ×1
GLOVE BIOGEL PI INDICATOR 8.5 (GLOVE) ×2
GLOVE ECLIPSE 7.5 STRL STRAW (GLOVE) ×2 IMPLANT
GLOVE ECLIPSE 8.5 STRL (GLOVE) ×1 IMPLANT
GLOVE EXAM NITRILE LRG STRL (GLOVE) IMPLANT
GLOVE EXAM NITRILE MD LF STRL (GLOVE) IMPLANT
GLOVE EXAM NITRILE XL STR (GLOVE) IMPLANT
GLOVE EXAM NITRILE XS STR PU (GLOVE) IMPLANT
GOWN BRE IMP SLV AUR LG STRL (GOWN DISPOSABLE) IMPLANT
GOWN BRE IMP SLV AUR XL STRL (GOWN DISPOSABLE) ×1 IMPLANT
GOWN STRL REIN 2XL LVL4 (GOWN DISPOSABLE) ×3 IMPLANT
HEAD HALTER (SOFTGOODS) ×2 IMPLANT
KIT BASIN OR (CUSTOM PROCEDURE TRAY) ×2 IMPLANT
KIT ROOM TURNOVER OR (KITS) ×2 IMPLANT
NDL HYPO 18GX1.5 BLUNT FILL (NEEDLE) ×1 IMPLANT
NDL HYPO 25X1 1.5 SAFETY (NEEDLE) ×1 IMPLANT
NDL SPNL 22GX3.5 QUINCKE BK (NEEDLE) ×2 IMPLANT
NEEDLE HYPO 18GX1.5 BLUNT FILL (NEEDLE) ×2 IMPLANT
NEEDLE HYPO 25X1 1.5 SAFETY (NEEDLE) ×2 IMPLANT
NEEDLE SPNL 22GX3.5 QUINCKE BK (NEEDLE) ×4 IMPLANT
NS IRRIG 1000ML POUR BTL (IV SOLUTION) ×2 IMPLANT
PACK LAMINECTOMY NEURO (CUSTOM PROCEDURE TRAY) ×2 IMPLANT
PAD ARMBOARD 7.5X6 YLW CONV (MISCELLANEOUS) ×2 IMPLANT
PATTIES SURGICAL 1X1 (DISPOSABLE) ×1 IMPLANT
PIN DISTRACTION 14MM (PIN) ×4 IMPLANT
PLATE 72MM (Plate) ×1 IMPLANT
RUBBERBAND STERILE (MISCELLANEOUS) ×4 IMPLANT
SCREW 14MM (Screw) ×10 IMPLANT
SPACER ASSEM CERV LORD 6M (Spacer) ×2 IMPLANT
SPACER ASSEM CERV LORD 7M (Spacer) ×2 IMPLANT
SPONGE GAUZE 4X4 12PLY (GAUZE/BANDAGES/DRESSINGS) IMPLANT
SPONGE INTESTINAL PEANUT (DISPOSABLE) ×3 IMPLANT
SPONGE SURGIFOAM ABS GEL 100 (HEMOSTASIS) ×1 IMPLANT
STAPLER SKIN PROX WIDE 3.9 (STAPLE) ×1 IMPLANT
STRIP CLOSURE SKIN 1/2X4 (GAUZE/BANDAGES/DRESSINGS) IMPLANT
SUT VIC AB 3-0 SH 8-18 (SUTURE) ×3 IMPLANT
SYR 20ML ECCENTRIC (SYRINGE) ×2 IMPLANT
SYR 3ML LL SCALE MARK (SYRINGE) ×2 IMPLANT
TOWEL OR 17X24 6PK STRL BLUE (TOWEL DISPOSABLE) ×2 IMPLANT
TOWEL OR 17X26 10 PK STRL BLUE (TOWEL DISPOSABLE) ×2 IMPLANT
TRAP SPECIMEN MUCOUS 40CC (MISCELLANEOUS) ×1 IMPLANT
WATER STERILE IRR 1000ML POUR (IV SOLUTION) ×2 IMPLANT

## 2012-10-27 NOTE — Progress Notes (Signed)
Dr Noreene Larsson here---giving IV Precedex bolus. He updated Dr Venetia Maxon by phone. Pt to go to 3100.

## 2012-10-27 NOTE — Interval H&P Note (Signed)
History and Physical Interval Note:  10/27/2012 6:47 AM  Dustin [name]  has presented today for surgery, with the diagnosis of Cervical spondylosis with myelopahy, Cervical stenosis, Cervicalgia  The various methods of treatment have been discussed with the patient and family. After consideration of risks, benefits and other options for treatment, the patient has consented to  Procedure(s) with comments: ANTERIOR CERVICAL DECOMPRESSION/DISCECTOMY FUSION 4 LEVELS (N/A) - C3-4 C4-5 C5-6 C6-7 Anterior cervical decompression/diskectomy/fusion as a surgical intervention .  The patient's history has been reviewed, patient examined, no change in status, stable for surgery.  I have reviewed the patient's chart and labs.  Questions were answered to the patient's satisfaction.     Marrie Chandra D

## 2012-10-27 NOTE — Op Note (Signed)
10/27/2012  4:18 PM  PATIENT:  Dustin [name]  72 y.o. male  PRE-OPERATIVE DIAGNOSIS:  Cervical spondylosis with myelopahy, Cervical stenosis, Cervicalgia, cervical radiculopathy C 34, C 45, C 56, C 67  POST-OPERATIVE DIAGNOSIS:  Cervical spondylosis with myelopahy, Cervical stenosis, Cervicalgia, cervical radiculopathy C 34, C 45, C 56, C 67  PROCEDURE:  Procedure(s) with comments: Cervical Three-Four Cervical Four-Five Cervical Five-Six Cervical Six-Seven  Anterior cervical decompression/diskectomy/fusion (N/A) - Cervical Three-Four Cervical Four-Five Cervical Five-Six Cervical Six-Seven  Anterior cervical decompression/diskectomy/fusion with allograft, autograft, plate  SURGEON:  Surgeon(s) and Role:    * Maeola Harman, MD - Primary    * Barnett Abu, MD - Assisting  PHYSICIAN ASSISTANT:   ASSISTANTS: Poteat, RN   ANESTHESIA:   general  EBL:  Total I/O In: 2000 [I.V.:2000] Out: 370 [Urine:170; Blood:200]  BLOOD ADMINISTERED:none  DRAINS: (10) Jackson-Pratt drain(s) with closed bulb suction in the prevertebral space   LOCAL MEDICATIONS USED:  MARCAINE     SPECIMEN:  No Specimen  DISPOSITION OF SPECIMEN:  N/A  COUNTS:  YES  TOURNIQUET:  * No tourniquets in log *  DICTATION: Patient was brought to operating room and following the smooth and uncomplicated induction of general endotracheal anesthesia his head was placed on a horseshoe head holder he was placed in 5 pounds of Holter traction and his anterior neck was prepped and draped in usual sterile fashion with betadine scrub and Duraprep. An incision was made on the left side of midline after infiltrating the skin and subcutaneous tissues with local lidocaine. The platysmal layer was incised and subplatysmal dissection was performed exposing the anterior border sternocleidomastoid muscle. Using blunt dissection the carotid sheath was kept lateral and trachea and esophagus kept medial exposing the anterior cervical spine. A  bent spinal needle was placed it was felt to be the C 45 level and this was confirmed on intraoperative x-ray. Longus coli muscles were taken down from the anterior cervical spine using electrocautery and key elevator and self-retaining retractor was placed. The interspaces from C3/4 to C6/7  were incised and a thorough discectomies were performed. Distraction pins were placed at the C3/4 level. Uncinate spurs and central spondylitic ridges were drilled down with a high-speed drill. The spinal cord dura and both C4 nerve roots were widely decompressed. Hemostasis was assured. After trial sizing a 6 mm machined lordotic allograft wedge was selected and tamped into position after packing with morselized bone autograft. The graft was tamped into position and countersunk appropriately.  Distraction pins were placed at the C4/5 level. Uncinate spurs and central spondylitic ridges were drilled down with a high-speed drill. The spinal cord dura and both C5 nerve roots were widely decompressed. Hemostasis was assured. After trial sizinga 6 mm machined lordotic allograft wedge was selected and tamped into position after packing with morselized bone autograft. The graft was tamped into position and countersunk appropriately. The retractor was moved and the interspace at C 56 was incised and a thorough discectomy was performed. Distraction pins were placed. Uncinate spurs and central spondylitic ridges were drilled down with a high-speed drill. The spinal cord dura and both C6 nerve roots were widely decompressed. Hemostasis was assured. After trial sizing a 7 mm machined lordotic allograft wedge was selected and tamped into position after packing with morselized bone autograft. The graft was tamped into position and countersunk appropriately.The interspace at C 67 was incised and a thorough discectomy was performed. Distraction pins were placed. Uncinate spurs and central spondylitic ridges were drilled  down with a high-speed  drill.   This interspace was overgrown with bone and it was drilled open with the high speed drill. The spinal cord dura and both C 7 nerve roots were widely decompressed. Hemostasis was assured. After trial sizing a 7 mm machined lordotic allograft wedge was selected and tamped into position after packing with morselized bone autograft. The graft was tamped into position and countersunk appropriately.  Distraction weight was removed. A 72 mm trestle luxe anterior cervical plate was affixed to the cervical spine with 14 mm variable-angle screws 2 at C3, 2 at C4, 2 at C5,  and 2 at C6, and 2 at C7.  All screws were well-positioned and locking mechanisms were engaged. Soft tissues were inspected and found to be in good repair. The wound was irrigated. A final x-ray was obtained with good visualization at C3 through C6 with the interbody grafts well visualized. A #10 JP drain was inserted through a separate stab incision. The platysma layer was closed with 3-0 Vicryl stitches and the skin was reapproximated with 3-0 Vicryl subcuticular stitches. The wound was dressed with benzoin and Dermabond and an occlusive dressing. Counts were correct at the end of the case. Patient was extubated and taken to recovery in stable and satisfactory condition.   PLAN OF CARE: Admit to inpatient   PATIENT DISPOSITION:  PACU - hemodynamically stable.   Delay start of Pharmacological VTE agent (>24hrs) due to surgical blood loss or risk of bleeding: yes

## 2012-10-27 NOTE — Anesthesia Preprocedure Evaluation (Signed)
Anesthesia Evaluation  Patient identified by MRN, date of birth, ID band Patient awake    Reviewed: Allergy & Precautions, H&P , NPO status , Patient's Chart, lab work & pertinent test results, reviewed documented beta blocker date and time   Airway Mallampati: II TM Distance: >3 FB Neck ROM: full    Dental   Pulmonary neg pulmonary ROS,  breath sounds clear to auscultation        Cardiovascular negative cardio ROS  Rhythm:regular     Neuro/Psych  Neuromuscular disease negative psych ROS   GI/Hepatic Neg liver ROS, GERD-  Medicated and Controlled,  Endo/Other  negative endocrine ROS  Renal/GU negative Renal ROS  negative genitourinary   Musculoskeletal   Abdominal   Peds  Hematology negative hematology ROS (+)   Anesthesia Other Findings See surgeon's H&P   Reproductive/Obstetrics negative OB ROS                           Anesthesia Physical Anesthesia Plan  ASA: II  Anesthesia Plan: General   Post-op Pain Management:    Induction: Intravenous  Airway Management Planned: Oral ETT  Additional Equipment:   Intra-op Plan:   Post-operative Plan: Extubation in OR  Informed Consent: I have reviewed the patients History and Physical, chart, labs and discussed the procedure including the risks, benefits and alternatives for the proposed anesthesia with the patient or authorized representative who has indicated his/her understanding and acceptance.   Dental Advisory Given  Plan Discussed with: CRNA and Surgeon  Anesthesia Plan Comments:         Anesthesia Quick Evaluation

## 2012-10-27 NOTE — Preoperative (Signed)
Beta Blockers   Reason not to administer Beta Blockers:Not Applicable 

## 2012-10-27 NOTE — Anesthesia Procedure Notes (Signed)
Procedure Name: Intubation Date/Time: 10/27/2012 12:32 PM Performed by: Margaree Mackintosh Pre-anesthesia Checklist: Patient identified, Timeout performed, Emergency Drugs available, Suction available and Patient being monitored Patient Re-evaluated:Patient Re-evaluated prior to inductionOxygen Delivery Method: Circle system utilized Preoxygenation: Pre-oxygenation with 100% oxygen Intubation Type: IV induction Ventilation: Mask ventilation without difficulty Laryngoscope Size: Mac and 3 Grade View: Grade II Tube type: Oral Tube size: 7.5 mm Number of attempts: 1 Airway Equipment and Method: Stylet Placement Confirmation: ETT inserted through vocal cords under direct vision,  positive ETCO2 and breath sounds checked- equal and bilateral Secured at: 23 cm Tube secured with: Tape Dental Injury: Teeth and Oropharynx as per pre-operative assessment

## 2012-10-27 NOTE — Anesthesia Postprocedure Evaluation (Signed)
  Anesthesia Post-op Note  Patient: Dustin [name]  Procedure(s) Performed: Procedure(s) with comments: Cervical Three-Four Cervical Four-Five Cervical Five-Six Cervical Six-Seven  Anterior cervical decompression/diskectomy/fusion (N/A) - Cervical Three-Four Cervical Four-Five Cervical Five-Six Cervical Six-Seven  Anterior cervical decompression/diskectomy/fusion  Patient Location: PACU  Anesthesia Type:General  Level of Consciousness: awake, alert  and sedated  Airway and Oxygen Therapy: Patient Spontanous Breathing and Patient connected to nasal cannula oxygen  Post-op Pain: mild  Post-op Assessment: Post-op Vital signs reviewed, Patient's Cardiovascular Status Stable, Respiratory Function Stable, Patent Airway and Pain level controlled  Post-op Vital Signs: stable  Complications: No apparent anesthesia complications

## 2012-10-27 NOTE — Progress Notes (Signed)
Awake, alert, conversant.  C/O neck and shoulder pain.  Strength full both deltoids, biceps, triceps, hand intrinsics.  MAEW.  Given IV valium for muscle relaxation and dilaudid for pain.  Will observe.  Plan on admission to 4N.

## 2012-10-27 NOTE — Brief Op Note (Signed)
10/27/2012  4:18 PM  PATIENT:  Dustin [name]  72 y.o. male  PRE-OPERATIVE DIAGNOSIS:  Cervical spondylosis with myelopahy, Cervical stenosis, Cervicalgia, cervical radiculopathy C 34, C 45, C 56, C 67  POST-OPERATIVE DIAGNOSIS:  Cervical spondylosis with myelopahy, Cervical stenosis, Cervicalgia, cervical radiculopathy C 34, C 45, C 56, C 67  PROCEDURE:  Procedure(s) with comments: Cervical Three-Four Cervical Four-Five Cervical Five-Six Cervical Six-Seven  Anterior cervical decompression/diskectomy/fusion (N/A) - Cervical Three-Four Cervical Four-Five Cervical Five-Six Cervical Six-Seven  Anterior cervical decompression/diskectomy/fusion with allograft, autograft, plate  SURGEON:  Surgeon(s) and Role:    * Tracy Gerken, MD - Primary    * Henry Elsner, MD - Assisting  PHYSICIAN ASSISTANT:   ASSISTANTS: Poteat, RN   ANESTHESIA:   general  EBL:  Total I/O In: 2000 [I.V.:2000] Out: 370 [Urine:170; Blood:200]  BLOOD ADMINISTERED:none  DRAINS: (10) Jackson-Pratt drain(s) with closed bulb suction in the prevertebral space   LOCAL MEDICATIONS USED:  MARCAINE     SPECIMEN:  No Specimen  DISPOSITION OF SPECIMEN:  N/A  COUNTS:  YES  TOURNIQUET:  * No tourniquets in log *  DICTATION: Patient was brought to operating room and following the smooth and uncomplicated induction of general endotracheal anesthesia his head was placed on a horseshoe head holder he was placed in 5 pounds of Holter traction and his anterior neck was prepped and draped in usual sterile fashion with betadine scrub and Duraprep. An incision was made on the left side of midline after infiltrating the skin and subcutaneous tissues with local lidocaine. The platysmal layer was incised and subplatysmal dissection was performed exposing the anterior border sternocleidomastoid muscle. Using blunt dissection the carotid sheath was kept lateral and trachea and esophagus kept medial exposing the anterior cervical spine. A  bent spinal needle was placed it was felt to be the C 45 level and this was confirmed on intraoperative x-ray. Longus coli muscles were taken down from the anterior cervical spine using electrocautery and key elevator and self-retaining retractor was placed. The interspaces from C3/4 to C6/7  were incised and a thorough discectomies were performed. Distraction pins were placed at the C3/4 level. Uncinate spurs and central spondylitic ridges were drilled down with a high-speed drill. The spinal cord dura and both C4 nerve roots were widely decompressed. Hemostasis was assured. After trial sizing a 6 mm machined lordotic allograft wedge was selected and tamped into position after packing with morselized bone autograft. The graft was tamped into position and countersunk appropriately.  Distraction pins were placed at the C4/5 level. Uncinate spurs and central spondylitic ridges were drilled down with a high-speed drill. The spinal cord dura and both C5 nerve roots were widely decompressed. Hemostasis was assured. After trial sizinga 6 mm machined lordotic allograft wedge was selected and tamped into position after packing with morselized bone autograft. The graft was tamped into position and countersunk appropriately. The retractor was moved and the interspace at C 56 was incised and a thorough discectomy was performed. Distraction pins were placed. Uncinate spurs and central spondylitic ridges were drilled down with a high-speed drill. The spinal cord dura and both C6 nerve roots were widely decompressed. Hemostasis was assured. After trial sizing a 7 mm machined lordotic allograft wedge was selected and tamped into position after packing with morselized bone autograft. The graft was tamped into position and countersunk appropriately.The interspace at C 67 was incised and a thorough discectomy was performed. Distraction pins were placed. Uncinate spurs and central spondylitic ridges were drilled   down with a high-speed  drill.   This interspace was overgrown with bone and it was drilled open with the high speed drill. The spinal cord dura and both C 7 nerve roots were widely decompressed. Hemostasis was assured. After trial sizing a 7 mm machined lordotic allograft wedge was selected and tamped into position after packing with morselized bone autograft. The graft was tamped into position and countersunk appropriately.  Distraction weight was removed. A 72 mm trestle luxe anterior cervical plate was affixed to the cervical spine with 14 mm variable-angle screws 2 at C3, 2 at C4, 2 at C5,  and 2 at C6, and 2 at C7.  All screws were well-positioned and locking mechanisms were engaged. Soft tissues were inspected and found to be in good repair. The wound was irrigated. A final x-ray was obtained with good visualization at C3 through C6 with the interbody grafts well visualized. A #10 JP drain was inserted through a separate stab incision. The platysma layer was closed with 3-0 Vicryl stitches and the skin was reapproximated with 3-0 Vicryl subcuticular stitches. The wound was dressed with benzoin and Dermabond and an occlusive dressing. Counts were correct at the end of the case. Patient was extubated and taken to recovery in stable and satisfactory condition.   PLAN OF CARE: Admit to inpatient   PATIENT DISPOSITION:  PACU - hemodynamically stable.   Delay start of Pharmacological VTE agent (>24hrs) due to surgical blood loss or risk of bleeding: yes  

## 2012-10-27 NOTE — Progress Notes (Signed)
Pt c/o severe pain at back of neck and in shoulders despite IV Dilaudid. Dr Venetia Maxon here & aware. IV Valium given in 2.5 mg increments per Dr Venetia Maxon. Pt initially seemed a bit more comfortable, then said med is not doing anything. VSS, =  Strength bilat in all extremities. Dr Noreene Larsson notified and said he will be up to see pt. IV Ofirmev given as ord. Dr Venetia Maxon came back to see pt and is aware of pt's cont. pain issues.

## 2012-10-27 NOTE — Transfer of Care (Signed)
Immediate Anesthesia Transfer of Care Note  Patient: Dustin [name]  Procedure(s) Performed: Procedure(s) with comments: Cervical Three-Four Cervical Four-Five Cervical Five-Six Cervical Six-Seven  Anterior cervical decompression/diskectomy/fusion (N/A) - Cervical Three-Four Cervical Four-Five Cervical Five-Six Cervical Six-Seven  Anterior cervical decompression/diskectomy/fusion  Patient Location: PACU  Anesthesia Type:General  Level of Consciousness: awake and alert   Airway & Oxygen Therapy: Patient Spontanous Breathing and Patient connected to face mask oxygen  Post-op Assessment: Report given to PACU RN, Post -op Vital signs reviewed and stable and Patient moving all extremities X 4  Post vital signs: Reviewed and stable  Complications: No apparent anesthesia complications

## 2012-10-28 NOTE — Evaluation (Signed)
Occupational Therapy Evaluation Patient Details Name: Dustin [name] MRN: [id number] DOB: 1940-12-26 Today's Date: 10/28/2012 Time: 2130-8657 OT Time Calculation (min): 19 min  OT Assessment / Plan / Recommendation History of present illness Anterior cervical decompression/diskectomy/fusion (N/A) - Cervical Three-Four Cervical Four-Five Cervical Five-Six Cervical Six-Seven   Clinical Impression   Pt admitted with above.  Will continue to follow acutely in order to address below problem list in prep for return home with family.    OT Assessment  Patient needs continued OT Services    Follow Up Recommendations  No OT follow up;Supervision - Intermittent    Barriers to Discharge      Equipment Recommendations  None recommended by OT    Recommendations for Other Services    Frequency  Min 2X/week    Precautions / Restrictions Precautions Precautions: Cervical Precaution Comments: handout provided Required Braces or Orthoses:  (pt with soft collar on) Restrictions Weight Bearing Restrictions: No   Pertinent Vitals/Pain See vitals    ADL  Eating/Feeding: Performed;Independent Where Assessed - Eating/Feeding: Edge of bed Lower Body Bathing: Simulated;Supervision/safety Where Assessed - Lower Body Bathing: Unsupported sitting Lower Body Dressing: Simulated;Supervision/safety Where Assessed - Lower Body Dressing: Unsupported sitting Transfers/Ambulation Related to ADLs: Pt declining mobility due to arrival of dinner tray. ADL Comments: Reviewed cervical precautions and educated pt on ADL techniques. Pt able to bring legs up and cross ankles over knees while sitting in order to perform LB dressing tasks.  Limited assessment of ADLs due to arrival of dinner and pt requesting eat.    OT Diagnosis: Generalized weakness;Acute pain  OT Problem List: Decreased strength;Decreased activity tolerance;Decreased knowledge of precautions;Pain OT Treatment Interventions: Self-care/ADL  training;Therapeutic activities;Patient/family education   OT Goals(Current goals can be found in the care plan section) Acute Rehab OT Goals Patient Stated Goal: home OT Goal Formulation: With patient Time For Goal Achievement: 11/04/12 Potential to Achieve Goals: Good  Visit Information  Last OT Received On: 10/28/12 Assistance Needed: +1 History of Present Illness: Anterior cervical decompression/diskectomy/fusion (N/A) - Cervical Three-Four Cervical Four-Five Cervical Five-Six Cervical Six-Seven       Prior Functioning     Home Living Family/patient expects to be discharged to:: Private residence Living Arrangements: Spouse/significant other (and 64 yo granddaughter) Available Help at Discharge: Family;Available 24 hours/day Type of Home: House Home Access: Stairs to enter [employer] of Steps: 3 Entrance Stairs-Rails: Left Home Layout: Two level;Able to live on main level with bedroom/bathroom Alternate Level Stairs-Number of Steps: 12 Alternate Level Stairs-Rails: Left Home Equipment: Walker - 2 wheels (elevated toliet) Prior Function Level of Independence: Independent Communication Communication: No difficulties Dominant Hand: Right         Vision/Perception     Cognition  Cognition Arousal/Alertness: Awake/alert Behavior During Therapy: WFL for tasks assessed/performed Overall Cognitive Status: Within Functional Limits for tasks assessed    Extremity/Trunk Assessment Upper Extremity Assessment Upper Extremity Assessment: Overall WFL for tasks assessed Lower Extremity Assessment Lower Extremity Assessment: Overall WFL for tasks assessed     Mobility Bed Mobility Bed Mobility: Rolling Right;Right Sidelying to Sit;Sitting - Scoot to Edge of Bed Rolling Right: 5: Supervision;With rail Right Sidelying to Sit: 5: Supervision;With rails Sitting - Scoot to Edge of Bed: 6: Modified independent (Device/Increase time) Sit to Sidelying Right: 4: Min  guard Details for Bed Mobility Assistance: Heavy reliance of bed rail. Transfers Sit to Stand: 4: Min guard;With upper extremity assist;From bed Stand to Sit: 4: Min guard;With upper extremity assist;To chair/3-in-1;To bed Details for Transfer  Assistance: slow, guarded, dizzy upon initial stand     Exercise     Balance     End of Session OT - End of Session Equipment Utilized During Treatment: Cervical collar Activity Tolerance: Patient tolerated treatment well Patient left: with call bell/phone within reach;with bed alarm set (sitting EOB to eat meal) Nurse Communication: Mobility status  GO    10/28/2012 Dustin [name] OTR/L Pager [phone] Office [phone]  Dustin [name], Dustin [name] 10/28/2012, 5:26 PM

## 2012-10-28 NOTE — Clinical Social Work Note (Signed)
Clinical Social Worker received referral for possible ST-SNF placement.  Chart reviewed.   Awaiting PT/OT evaluations and recommendations.  Spoke with RN Case Manager who will follow up with patient to discuss home health needs if necessary at discharge.    CSW signing off - please re consult if social work needs arise.  Macario Golds, [place] [phone]

## 2012-10-28 NOTE — Progress Notes (Signed)
Subjective: Patient reports feeling better, less neck pain  Objective: Vital signs in last 24 hours: Temp:  [97 F (36.1 C)-98.4 F (36.9 C)] 97.5 F (36.4 C) (09/10 0400) Pulse Rate:  [42-104] 42 (09/10 0700) Resp:  [6-23] 9 (09/10 0700) BP: (92-151)/(45-89) 115/48 mmHg (09/10 0700) SpO2:  [91 %-100 %] 100 % (09/10 0700) Weight:  [89.5 kg (197 lb 5 oz)-90.4 kg (199 lb 4.7 oz)] 90.4 kg (199 lb 4.7 oz) (09/09 2000)  Intake/Output from previous day: 09/09 0701 - 09/10 0700 In: 3839.8 [P.O.:170; I.V.:3569.8; IV Piggyback:100] Out: 1935 [Urine:1620; Drains:115; Blood:200] Intake/Output this shift:    Physical Exam: Full strength upper and lower extremities all groups.  No numbness.  Still c/o neck pain.  Lab Results: No results found for this basename: WBC, HGB, HCT, PLT,  in the last 72 hours BMET No results found for this basename: NA, K, CL, CO2, GLUCOSE, BUN, CREATININE, CALCIUM,  in the last 72 hours  Studies/Results: Dg Cervical Spine 2-3 Views  10/27/2012   *RADIOLOGY REPORT*  Clinical Data: Cervical spondylosis with myelopathy; C3 - C7 ACDF  CERVICAL SPINE - 2-3 VIEW  Comparison: Cervical spine radiographs - 10/09/2012; cervical spine MRI - 10/07/2012  Findings:  Two lateral intraoperative radiographic images of the cervical spine are provided for review.  Initial image demonstrates the tip of a radiopaque marking instrument overlying the soft tissues anterior to the C4 - C5 intervertebral disc space.  An endotracheal tube overlies the tracheal air column with tip excluded from view.  Subsequent image demonstrates the sequela of C3 - C7 ACDF with intervertebral disc space replacements.  Surgical drains are seen about the operative site.  There is expected prevertebral soft tissue swelling and minimal amount of retropharyngeal air.  No definite radiopaque foreign body.  A presumed nasogastric tube tip terminates below the field of view.  IMPRESSION: Post C3 - C7 ACDF.   Original  Report Authenticated By: Tacey Ruiz, MD    Assessment/Plan: Transfer to 4 N.  D/C precedex.  Mobilize with PT.  Continue JP drain.    LOS: 1 day    Dorian Heckle, MD 10/28/2012, 8:12 AM

## 2012-10-28 NOTE — Progress Notes (Signed)
UR COMPLETED  

## 2012-10-28 NOTE — Evaluation (Signed)
Physical Therapy Evaluation Patient Details Name: Dustin [name] MRN: [id number] DOB: 07/29/1940 Today's Date: 10/28/2012 Time: 1478-2956 PT Time Calculation (min): 28 min  PT Assessment / Plan / Recommendation History of Present Illness  Anterior cervical decompression/diskectomy/fusion (N/A) - Cervical Three-Four Cervical Four-Five Cervical Five-Six Cervical Six-Seven  Clinical Impression  Pt with good understanding of cervical precautions and is motivated. Pt tolerated OOB mobility well despite 7/10 neck pain. Anticipate pt to be safe for d/c home with family once medically stable.    PT Assessment  Patient needs continued PT services    Follow Up Recommendations  No PT follow up;Supervision - Intermittent    Does the patient have the potential to tolerate intense rehabilitation      Barriers to Discharge        Equipment Recommendations  None recommended by PT    Recommendations for Other Services     Frequency Min 5X/week    Precautions / Restrictions Precautions Precautions: Cervical Precaution Comments: handout provided Required Braces or Orthoses:  (pt with soft collar on) Restrictions Weight Bearing Restrictions: No   Pertinent Vitals/Pain 7/10 cervical neck pain      Mobility  Bed Mobility Bed Mobility: Rolling Right;Right Sidelying to Sit;Sit to Sidelying Right Rolling Right: 5: Supervision Right Sidelying to Sit: 4: Min guard Sit to Sidelying Right: 4: Min guard Details for Bed Mobility Assistance: definite use of bed, directional v/c's for technique Transfers Transfers: Sit to Stand;Stand to Sit Sit to Stand: 4: Min guard;With upper extremity assist;From bed Stand to Sit: 4: Min guard;With upper extremity assist;To chair/3-in-1;To bed Details for Transfer Assistance: slow, guarded, dizzy upon initial stand Ambulation/Gait Ambulation/Gait Assistance: 4: Min guard Ambulation Distance (Feet): 150 Feet Assistive device: None Ambulation/Gait  Assistance Details: pt mildly unsteady as expect POD 0. no episodes of LOB Gait Pattern: Step-through pattern;Decreased stride length Gait velocity: slow/guarded Stairs: No    Exercises     PT Diagnosis: Difficulty walking;Acute pain  PT Problem List: Decreased strength;Decreased activity tolerance;Decreased balance;Decreased mobility PT Treatment Interventions: DME instruction;Gait training;Stair training;Functional mobility training;Therapeutic activities;Therapeutic exercise     PT Goals(Current goals can be found in the care plan section) Acute Rehab PT Goals Patient Stated Goal: home PT Goal Formulation: With patient Time For Goal Achievement: 11/04/12 Potential to Achieve Goals: Good  Visit Information  Last PT Received On: 10/28/12 Assistance Needed: +1 History of Present Illness: Anterior cervical decompression/diskectomy/fusion (N/A) - Cervical Three-Four Cervical Four-Five Cervical Five-Six Cervical Six-Seven       Prior Functioning  Home Living Family/patient expects to be discharged to:: Private residence Living Arrangements: Spouse/significant other (and 71 yo granddaughter) Available Help at Discharge: Family;Available 24 hours/day Type of Home: House Home Access: Stairs to enter [employer] of Steps: 3 Entrance Stairs-Rails: Left Home Layout: Two level;Able to live on main level with bedroom/bathroom Alternate Level Stairs-Number of Steps: 12 Alternate Level Stairs-Rails: Left Home Equipment: Walker - 2 wheels (elevated toliet) Prior Function Level of Independence: Independent Communication Communication: No difficulties Dominant Hand: Right    Cognition  Cognition Arousal/Alertness: Awake/alert Behavior During Therapy: WFL for tasks assessed/performed Overall Cognitive Status: Within Functional Limits for tasks assessed    Extremity/Trunk Assessment Upper Extremity Assessment Upper Extremity Assessment: Overall WFL for tasks  assessed Lower Extremity Assessment Lower Extremity Assessment: Overall WFL for tasks assessed   Balance    End of Session PT - End of Session Equipment Utilized During Treatment: Gait belt Activity Tolerance: Patient tolerated treatment well Patient left: in bed;with call bell/phone  within reach (pt deferred chair due to discomfort) Nurse Communication: Mobility status  GP     Marcene Brawn 10/28/2012, 3:26 PM  Lewis Shock, PT, DPT Pager #: [phone] Office #: [phone]

## 2012-10-29 MED ORDER — OXYCODONE HCL ER 15 MG PO T12A
40.0000 mg | EXTENDED_RELEASE_TABLET | Freq: Two times a day (BID) | ORAL | Status: DC
  Administered 2012-10-29 – 2012-10-30 (×3): 40 mg via ORAL
  Filled 2012-10-29: qty 2
  Filled 2012-10-29 (×2): qty 1
  Filled 2012-10-29: qty 2
  Filled 2012-10-29: qty 1
  Filled 2012-10-29: qty 2

## 2012-10-29 MED ORDER — OXYCODONE HCL 5 MG PO TABS
5.0000 mg | ORAL_TABLET | ORAL | Status: DC | PRN
Start: 2012-10-29 — End: 2012-10-30
  Administered 2012-10-29: 10 mg via ORAL
  Filled 2012-10-29: qty 2

## 2012-10-29 NOTE — Progress Notes (Signed)
Occupational Therapy Treatment Patient Details Name: Dustin [name] MRN: [id number] DOB: [birth year] Today's Date: 10/29/2012 Time: 4098-1191 OT Time Calculation (min): 11 min  OT Assessment / Plan / Recommendation  History of present illness Anterior cervical decompression/diskectomy/fusion (N/A) - Cervical Three-Four Cervical Four-Five Cervical Five-Six Cervical Six-Seven   OT comments  Pt limited by pain this session.  RN made aware.  Will re-attempt another this session this PM as time allows to ensure pt is progressing with functional mobility and ADLs.   Follow Up Recommendations  No OT follow up;Supervision - Intermittent    Barriers to Discharge       Equipment Recommendations  None recommended by OT    Recommendations for Other Services    Frequency Min 2X/week   Progress towards OT Goals Progress towards OT goals: Progressing toward goals  Plan Discharge plan remains appropriate    Precautions / Restrictions Precautions Precautions: Cervical Precaution Comments: handout provided   Pertinent Vitals/Pain See vitals    ADL  Lower Body Dressing: Performed;Supervision/safety Where Assessed - Lower Body Dressing: Unsupported sitting Toilet Transfer: Simulated;Min guard Toilet Transfer Method: Sit to stand Toilet Transfer Equipment:  (bed<>chair) Equipment Used:  (soft collar) Transfers/Ambulation Related to ADLs: Pt very painful sitting in chair on OT arrival.  Assisted pt return to bed with min guard for safety. ADL Comments: Pt limited by pain this session. Reviewed cervical precautions which pt acknowledges verbal understanding. Requires min verbal cueing however to generalize cervical precautions during ADLs (cued to cross legs in order to doff socks rather than bend over).    OT Diagnosis:    OT Problem List:   OT Treatment Interventions:     OT Goals(current goals can now be found in the care plan section) Acute Rehab OT Goals Patient Stated Goal: home OT  Goal Formulation: With patient Time For Goal Achievement: 11/04/12 Potential to Achieve Goals: Good ADL Goals Pt Will Perform Lower Body Dressing: with modified independence;sit to/from stand Pt Will Transfer to Toilet: with modified independence;ambulating Additional ADL Goal #1: Pt will perform bed mobility at mod I level as precursor for EOB ADLs. Additional ADL Goal #2: Pt will maintain cervical precautions during all ADL tasks.  Visit Information  Last OT Received On: 10/29/12 Assistance Needed: +1 History of Present Illness: Anterior cervical decompression/diskectomy/fusion (N/A) - Cervical Three-Four Cervical Four-Five Cervical Five-Six Cervical Six-Seven    Subjective Data      Prior Functioning       Cognition  Cognition Arousal/Alertness: Awake/alert Behavior During Therapy: WFL for tasks assessed/performed Overall Cognitive Status: Within Functional Limits for tasks assessed    Mobility  Bed Mobility Bed Mobility: Sit to Sidelying Right Sit to Sidelying Right: 4: Min assist;With rail Details for Bed Mobility Assistance: Assist for LEs. Incr time due to pain. Transfers Transfers: Sit to Stand;Stand to Sit Sit to Stand: 4: Min guard;From chair/3-in-1;With upper extremity assist Stand to Sit: 4: Min guard;To bed;With upper extremity assist    Exercises      Balance     End of Session OT - End of Session Equipment Utilized During Treatment: Cervical collar Activity Tolerance: Patient limited by pain Patient left: in bed;with call bell/phone within reach Nurse Communication: Mobility status;Patient requests pain meds  GO   10/29/2012 Dustin [name] OTR/L Pager [phone] Office [phone]   Dustin [name], [name] 10/29/2012, 11:36 AM

## 2012-10-29 NOTE — Progress Notes (Signed)
Improving pain control.  Will try dilaudid today and if doing well, D/C home

## 2012-10-29 NOTE — Progress Notes (Signed)
Subjective: Patient reports "I just still hurt in the back of my neck"  Objective: Vital signs in last 24 hours: Temp:  [97.4 F (36.3 C)-98.4 F (36.9 C)] 98.4 F (36.9 C) (09/11 0528) Pulse Rate:  [52-93] 85 (09/11 0528) Resp:  [8-18] 16 (09/11 0528) BP: (105-133)/(51-65) 129/61 mmHg (09/11 0528) SpO2:  [96 %-99 %] 97 % (09/11 0528)  Intake/Output from previous day: 09/10 0701 - 09/11 0700 In: 232.3 [I.V.:232.3] Out: 90 [Drains:90] Intake/Output this shift:    Alert, conversant. Good strength BUE, limited by pain. Incision without erythema, swelling or drainage. JP patent. 70ml o/p overnight.   Lab Results: No results found for this basename: WBC, HGB, HCT, PLT,  in the last 72 hours BMET No results found for this basename: NA, K, CL, CO2, GLUCOSE, BUN, CREATININE, CALCIUM,  in the last 72 hours  Studies/Results: Dg Cervical Spine 2-3 Views  10/27/2012   *RADIOLOGY REPORT*  Clinical Data: Cervical spondylosis with myelopathy; C3 - C7 ACDF  CERVICAL SPINE - 2-3 VIEW  Comparison: Cervical spine radiographs - 10/09/2012; cervical spine MRI - 10/07/2012  Findings:  Two lateral intraoperative radiographic images of the cervical spine are provided for review.  Initial image demonstrates the tip of a radiopaque marking instrument overlying the soft tissues anterior to the C4 - C5 intervertebral disc space.  An endotracheal tube overlies the tracheal air column with tip excluded from view.  Subsequent image demonstrates the sequela of C3 - C7 ACDF with intervertebral disc space replacements.  Surgical drains are seen about the operative site.  There is expected prevertebral soft tissue swelling and minimal amount of retropharyngeal air.  No definite radiopaque foreign body.  A presumed nasogastric tube tip terminates below the field of view.  IMPRESSION: Post C3 - C7 ACDF.   Original Report Authenticated By: Tacey Ruiz, MD    Assessment/Plan: Improving   LOS: 2 days  Per Dr. Venetia Maxon,  will leave JP this am d/t 70ml overnight. Will d/c IV pain meds (pt understands he cannot go home on IV) and use Dilaudid p.o. for pain & Valium p.o. for spasm/muscle tension as already ordered.    Dustin [name] 10/29/2012, 8:05 AM

## 2012-10-29 NOTE — Progress Notes (Signed)
PT Cancellation Note  Patient Details Name: Dustin [name] MRN: [id number] DOB: 11/16/1940   Cancelled Treatment:    Reason Eval/Treat Not Completed: Medical issues which prohibited therapy Pt declined therapy twice today due to level of pain. RN is aware and did provide pain medication. Pt is reporting his pain has not improved since receiving medication.   Greggory Stallion 10/29/2012, 11:49 AM

## 2012-10-30 ENCOUNTER — Encounter (HOSPITAL_COMMUNITY): Payer: Self-pay | Admitting: Neurosurgery

## 2012-10-30 MED ORDER — OXYCODONE HCL 5 MG PO TABS: 5.0000 mg | ORAL_TABLET | ORAL | Status: DC | PRN

## 2012-10-30 MED ORDER — DIAZEPAM 5 MG PO TABS: 5.0000 mg | ORAL_TABLET | Freq: Four times a day (QID) | ORAL | Status: DC | PRN

## 2012-10-30 MED ORDER — GABAPENTIN 400 MG PO CAPS: 400.0000 mg | ORAL_CAPSULE | Freq: Four times a day (QID) | ORAL | Status: DC

## 2012-10-30 MED ORDER — OXYCODONE HCL ER 40 MG PO T12A: 40.0000 mg | EXTENDED_RELEASE_TABLET | Freq: Two times a day (BID) | ORAL | Status: DC

## 2012-10-30 NOTE — Progress Notes (Signed)
Occupational Therapy Treatment Patient Details Name: Dustin [name] MRN: [id number] DOB: 10/17/1940 Today's Date: 10/30/2012 Time: 7846-9629 OT Time Calculation (min): 13 min  OT Assessment / Plan / Recommendation  History of present illness Anterior cervical decompression/diskectomy/fusion (N/A) - Cervical Three-Four Cervical Four-Five Cervical Five-Six Cervical Six-Seven   OT comments  Pt feeling much better today (states the difference between now and yesterday's pain is like "night and day").  Pt has met goals and has no further acute OT needs. Will sign off.  Follow Up Recommendations  No OT follow up;Supervision - Intermittent    Barriers to Discharge       Equipment Recommendations  None recommended by OT    Recommendations for Other Services    Frequency Min 2X/week   Progress towards OT Goals Progress towards OT goals: Goals met/education completed, patient discharged from OT  Plan All goals met and education completed, patient discharged from OT services    Precautions / Restrictions Precautions Precautions: Cervical Precaution Comments: handout provided   Pertinent Vitals/Pain See vitals    ADL  Lower Body Dressing: Performed;Modified independent Where Assessed - Lower Body Dressing: Unsupported sit to stand Toilet Transfer: Performed;Modified independent Toilet Transfer Method: Sit to Barista: Comfort height toilet Toileting - Clothing Manipulation and Hygiene: Performed;Modified independent Where Assessed - Toileting Clothing Manipulation and Hygiene: Sit to stand from 3-in-1 or toilet Equipment Used:  (soft collar) Transfers/Ambulation Related to ADLs: mod I. Incr time for pain but no LOB. ADL Comments: Reviewed cervical precautions and ADL techniques to maintain precautions.  Pt states his house is pretty accessible and safe due to wife's physical limitations.      OT Diagnosis:    OT Problem List:   OT Treatment Interventions:      OT Goals(current goals can now be found in the care plan section) Acute Rehab OT Goals Patient Stated Goal: home OT Goal Formulation: With patient Time For Goal Achievement: 11/04/12 Potential to Achieve Goals: Good ADL Goals Pt Will Perform Lower Body Dressing: with modified independence;sit to/from stand Pt Will Transfer to Toilet: with modified independence;ambulating Additional ADL Goal #1: Pt will perform bed mobility at mod I level as precursor for EOB ADLs. Additional ADL Goal #2: Pt will maintain cervical precautions during all ADL tasks.  Visit Information  Last OT Received On: 10/30/12 Assistance Needed: +1 History of Present Illness: Anterior cervical decompression/diskectomy/fusion (N/A) - Cervical Three-Four Cervical Four-Five Cervical Five-Six Cervical Six-Seven    Subjective Data      Prior Functioning       Cognition  Cognition Arousal/Alertness: Awake/alert Behavior During Therapy: WFL for tasks assessed/performed Overall Cognitive Status: Within Functional Limits for tasks assessed    Mobility  Bed Mobility Bed Mobility: Rolling Right;Right Sidelying to Sit;Sit to Sidelying Right Rolling Right: 6: Modified independent (Device/Increase time) Right Sidelying to Sit: 6: Modified independent (Device/Increase time) Sitting - Scoot to Edge of Bed: 6: Modified independent (Device/Increase time) Sit to Sidelying Right: 6: Modified independent (Device/Increase time) Transfers Transfers: Sit to Stand;Stand to Sit Sit to Stand: 6: Modified independent (Device/Increase time) Stand to Sit: 6: Modified independent (Device/Increase time)    Exercises      Balance     End of Session OT - End of Session Equipment Utilized During Treatment: Cervical collar Activity Tolerance: Patient tolerated treatment well Patient left: in bed;with call bell/phone within reach  GO    10/30/2012 [name] OTR/L Pager [phone] Office [phone]  Abdulmalik, Darco 10/30/2012, 10:24  AM

## 2012-10-30 NOTE — Progress Notes (Signed)
Much better pain control

## 2012-10-30 NOTE — Progress Notes (Signed)
Subjective: Patient reports "Oh I feel like a new man. The pain control is much better"  Objective: Vital signs in last 24 hours: Temp:  [98.1 F (36.7 C)-98.6 F (37 C)] 98.1 F (36.7 C) (09/12 0525) Pulse Rate:  [85-98] 91 (09/12 0525) Resp:  [17-20] 20 (09/12 0525) BP: (101-137)/(57-71) 117/71 mmHg (09/12 0525) SpO2:  [92 %-100 %] 95 % (09/12 0525)  Intake/Output from previous day:   Intake/Output this shift:    Alert, conversant. Pain well controlled this am. Good strength BUE. JP 70ml overnight. No BM since Monday, but no c/o discomfort.  Lab Results: No results found for this basename: WBC, HGB, HCT, PLT,  in the last 72 hours BMET No results found for this basename: NA, K, CL, CO2, GLUCOSE, BUN, CREATININE, CALCIUM,  in the last 72 hours  Studies/Results: No results found.  Assessment/Plan: Improved pain control.   LOS: 3 days  Mobilize; continue soft collar. Will work on bowels today and streamline pain meds for anticipated d/c to home.    Georgiann Cocker 10/30/2012, 8:44 AM

## 2012-10-30 NOTE — Progress Notes (Signed)
Patient ID: [name], male   DOB: [birth year], 72 y.o.   MRN: [id number]  JP pulled as ordered by Dr. Venetia Maxon. Pt tolerated all well. 2x2 to site.  Georgiann Cocker, RN, BSN

## 2012-10-31 NOTE — Discharge Summary (Signed)
Physician Discharge Summary  Patient ID: Dustin [name] MRN: [id number] DOB/AGE: Jul 28, 1940 72 y.o.  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 *   Discharged Condition: good  Hospital Course: Patient underwent uncomplicated anterior cervical decompression and fusion C 34, C 45, C 56, C 67.  Postoperatively, he complained of severe neck pain.  He was observed in the Neuro ICU overnight and treated with a precedex drip.  He was then transferred to 4N and was given a variety of pain medications and muscle relaxants over the next 2 days before arriving at a regimen of oxycontin and oxy IR with valium which seemed to give him good pain relief.  He was discharged home on this regimen.  Consults: None  Significant Diagnostic Studies: None  Treatments: surgery: anterior cervical decompression and fusion C 34, C 45, C 56, C 67  Discharge Exam: Blood pressure 99/64, pulse 127, temperature 97.6 F (36.4 C), temperature source Oral, resp. rate 20, height 6\' 2"  (1.88 m), weight 90.4 kg (199 lb 4.7 oz), SpO2 97.00%. Neurologic: Alert and oriented X 3, normal strength and tone. Normal symmetric reflexes. Normal coordination and gait Wound:CDI  Disposition: Home   Future Appointments Provider Department Dept Phone   11/12/2012 11:30 AM Levert Feinstein, MD [facility] [phone]       Medication List         alfuzosin 10 MG 24 hr tablet  Commonly known as:  UROXATRAL  10 mg daily.     aspirin 81 MG tablet  Take 81 mg by mouth daily.     cholecalciferol 1000 UNITS tablet  Commonly known as:  VITAMIN D  Take 2,000 Units by mouth daily. Takes 2000 iu     diazepam 5 MG tablet  Commonly known as:  VALIUM  Take 1-2 tablets (5-10 mg total) by mouth every 6 (six) hours as needed.     finasteride 5 MG tablet  Commonly known as:  PROSCAR  Take 5 mg by mouth daily.     gabapentin 400 MG capsule  Commonly known as:  NEURONTIN  Take 1 capsule (400 mg total) by mouth 4 (four) times daily.     oxyCODONE 5 MG immediate release tablet  Commonly known as:  Oxy IR/ROXICODONE  Take 1-2 tablets (5-10 mg total) by mouth every 4 (four) hours as needed for pain.     OxyCODONE 40 mg T12a 12 hr tablet  Commonly known as:  OXYCONTIN  Take 1 tablet (40 mg total) by mouth every 12 (twelve) hours.     oxyCODONE-acetaminophen 10-325 MG per tablet  Commonly known as:  PERCOCET  10-325 tablets as needed.     pantoprazole 40 MG tablet  Commonly known as:  PROTONIX  Take 40 mg by mouth daily.     traZODone 100 MG tablet  Commonly known as:  DESYREL  100 mg daily.         Signed: Dorian Heckle, MD 10/31/2012, 8:54 AM

## 2012-11-12 ENCOUNTER — Ambulatory Visit: Payer: Medicare Other | Admitting: Neurology

## 2012-11-19 DIAGNOSIS — D649 Anemia, unspecified: Secondary | ICD-10-CM | POA: Diagnosis not present

## 2012-11-19 DIAGNOSIS — R634 Abnormal weight loss: Secondary | ICD-10-CM | POA: Diagnosis not present

## 2012-11-19 DIAGNOSIS — F329 Major depressive disorder, single episode, unspecified: Secondary | ICD-10-CM | POA: Diagnosis not present

## 2012-11-23 DIAGNOSIS — Z4789 Encounter for other orthopedic aftercare: Secondary | ICD-10-CM | POA: Diagnosis not present

## 2012-11-25 DIAGNOSIS — M4802 Spinal stenosis, cervical region: Secondary | ICD-10-CM | POA: Diagnosis not present

## 2012-11-25 DIAGNOSIS — M4712 Other spondylosis with myelopathy, cervical region: Secondary | ICD-10-CM | POA: Diagnosis not present

## 2012-11-25 DIAGNOSIS — M542 Cervicalgia: Secondary | ICD-10-CM | POA: Diagnosis not present

## 2012-12-17 ENCOUNTER — Other Ambulatory Visit: Payer: Self-pay | Admitting: Family Medicine

## 2012-12-17 ENCOUNTER — Ambulatory Visit
Admission: RE | Admit: 2012-12-17 | Discharge: 2012-12-17 | Disposition: A | Discharge status: Home / Self Care | Payer: Medicare Other | Source: Ambulatory Visit | Attending: Family Medicine | Admitting: Family Medicine

## 2012-12-17 DIAGNOSIS — R634 Abnormal weight loss: Secondary | ICD-10-CM

## 2012-12-17 DIAGNOSIS — D649 Anemia, unspecified: Secondary | ICD-10-CM | POA: Diagnosis not present

## 2013-01-04 DIAGNOSIS — M1710 Unilateral primary osteoarthritis, unspecified knee: Secondary | ICD-10-CM | POA: Diagnosis not present

## 2013-01-04 DIAGNOSIS — Z4789 Encounter for other orthopedic aftercare: Secondary | ICD-10-CM | POA: Diagnosis not present

## 2013-01-11 DIAGNOSIS — M4802 Spinal stenosis, cervical region: Secondary | ICD-10-CM | POA: Diagnosis not present

## 2013-01-11 DIAGNOSIS — M4712 Other spondylosis with myelopathy, cervical region: Secondary | ICD-10-CM | POA: Diagnosis not present

## 2013-01-11 DIAGNOSIS — M542 Cervicalgia: Secondary | ICD-10-CM | POA: Diagnosis not present

## 2013-01-20 DIAGNOSIS — T148XXA Other injury of unspecified body region, initial encounter: Secondary | ICD-10-CM | POA: Diagnosis not present

## 2013-01-20 DIAGNOSIS — L821 Other seborrheic keratosis: Secondary | ICD-10-CM | POA: Diagnosis not present

## 2013-01-20 DIAGNOSIS — D1801 Hemangioma of skin and subcutaneous tissue: Secondary | ICD-10-CM | POA: Diagnosis not present

## 2013-01-20 DIAGNOSIS — Z85828 Personal history of other malignant neoplasm of skin: Secondary | ICD-10-CM | POA: Diagnosis not present

## 2013-01-20 DIAGNOSIS — L570 Actinic keratosis: Secondary | ICD-10-CM | POA: Diagnosis not present

## 2013-01-20 DIAGNOSIS — K031 Abrasion of teeth: Secondary | ICD-10-CM | POA: Diagnosis not present

## 2013-02-02 ENCOUNTER — Encounter: Payer: Self-pay | Admitting: Neurology

## 2013-02-02 ENCOUNTER — Encounter (INDEPENDENT_AMBULATORY_CARE_PROVIDER_SITE_OTHER): Payer: Self-pay

## 2013-02-02 ENCOUNTER — Ambulatory Visit (INDEPENDENT_AMBULATORY_CARE_PROVIDER_SITE_OTHER): Payer: Medicare Other | Admitting: Neurology

## 2013-02-02 VITALS — BP 104/59 | HR 73 | Ht 73.0 in | Wt 195.0 lb

## 2013-02-02 DIAGNOSIS — G589 Mononeuropathy, unspecified: Secondary | ICD-10-CM | POA: Diagnosis not present

## 2013-02-02 DIAGNOSIS — G6289 Other specified polyneuropathies: Secondary | ICD-10-CM

## 2013-02-02 MED ORDER — GABAPENTIN 400 MG PO CAPS: 400.0000 mg | ORAL_CAPSULE | Freq: Four times a day (QID) | ORAL | Status: DC

## 2013-02-02 NOTE — Progress Notes (Signed)
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 Laboratory evaluation failed to demonstrate etiology,    He is currently taking gabapentin400 mg 4 times daily without side effects, he can barely feel his feet paresthesia   In October 2014, he underwent a cervical decompression surgery for his neck pain, headaches, still in recovery, he denied gait difficulty, denies bilateral hands  paresthesia   ROS: Ringing in the ears and hearing loss, Headaches  Physical Exam General: well developed, well nourished, seated, in no evident distress Head: head normocephalic and atraumatic. Oropharynx benign Neck: supple with no carotid or supraclavicular bruits Cardiovascular: regular rate and rhythm, no murmurs  Neurologic Exam: Mental Status: Awake and fully alert. Oriented to place and time. Recent and remote memory intact. Attention span, concentration and fund of knowledge appropriate. Mood and affect appropriate.  Cranial Nerves Pupils equal, briskly reactive to light. Extraocular movements full without nystagmus. Visual fields full to confrontation. Hearing intact and symmetric to finger snap. Facial sensation intact. Face, tongue, palate move normally and symmetrically. Neck flexion and extension normal.  Motor: Normal bulk and tone. Normal strength in all tested extremity muscles. Sensory.: intact to touch and pinprick and vibratory in upper and lower extremities.  Coordination: Rapid alternating movements normal in all extremities. Finger-to-nose and heel-to-shin performed accurately bilaterally. Gait and Station: Arises from chair without difficulty. Stance is normal. Gait demonstrates normal stride length and balance . Able to heel, toe and tandem walk without difficulty.  Reflexes: 1+ and symmetric. Toes downgoing.      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

## 2013-02-04 DIAGNOSIS — Z136 Encounter for screening for cardiovascular disorders: Secondary | ICD-10-CM | POA: Diagnosis not present

## 2013-02-04 DIAGNOSIS — N400 Enlarged prostate without lower urinary tract symptoms: Secondary | ICD-10-CM | POA: Diagnosis not present

## 2013-02-04 DIAGNOSIS — G2581 Restless legs syndrome: Secondary | ICD-10-CM | POA: Diagnosis not present

## 2013-02-04 DIAGNOSIS — D649 Anemia, unspecified: Secondary | ICD-10-CM | POA: Diagnosis not present

## 2013-02-04 DIAGNOSIS — R634 Abnormal weight loss: Secondary | ICD-10-CM | POA: Diagnosis not present

## 2013-02-04 DIAGNOSIS — K219 Gastro-esophageal reflux disease without esophagitis: Secondary | ICD-10-CM | POA: Diagnosis not present

## 2013-02-04 DIAGNOSIS — F329 Major depressive disorder, single episode, unspecified: Secondary | ICD-10-CM | POA: Diagnosis not present

## 2013-02-04 DIAGNOSIS — Z0000 Encounter for general adult medical examination without abnormal findings: Secondary | ICD-10-CM | POA: Diagnosis not present

## 2013-02-04 DIAGNOSIS — G4700 Insomnia, unspecified: Secondary | ICD-10-CM | POA: Diagnosis not present

## 2013-02-10 DIAGNOSIS — M542 Cervicalgia: Secondary | ICD-10-CM | POA: Diagnosis not present

## 2013-02-10 DIAGNOSIS — M4712 Other spondylosis with myelopathy, cervical region: Secondary | ICD-10-CM | POA: Diagnosis not present

## 2013-02-10 DIAGNOSIS — M47817 Spondylosis without myelopathy or radiculopathy, lumbosacral region: Secondary | ICD-10-CM | POA: Diagnosis not present

## 2013-02-10 DIAGNOSIS — M4802 Spinal stenosis, cervical region: Secondary | ICD-10-CM | POA: Diagnosis not present

## 2013-03-10 DIAGNOSIS — M47817 Spondylosis without myelopathy or radiculopathy, lumbosacral region: Secondary | ICD-10-CM | POA: Diagnosis not present

## 2013-03-10 DIAGNOSIS — M549 Dorsalgia, unspecified: Secondary | ICD-10-CM | POA: Diagnosis not present

## 2013-03-10 DIAGNOSIS — M542 Cervicalgia: Secondary | ICD-10-CM | POA: Diagnosis not present

## 2013-03-18 DIAGNOSIS — M545 Low back pain, unspecified: Secondary | ICD-10-CM | POA: Diagnosis not present

## 2013-03-18 DIAGNOSIS — M549 Dorsalgia, unspecified: Secondary | ICD-10-CM | POA: Diagnosis not present

## 2013-03-18 DIAGNOSIS — M47817 Spondylosis without myelopathy or radiculopathy, lumbosacral region: Secondary | ICD-10-CM | POA: Diagnosis not present

## 2013-05-03 DIAGNOSIS — H43399 Other vitreous opacities, unspecified eye: Secondary | ICD-10-CM | POA: Diagnosis not present

## 2013-05-03 DIAGNOSIS — H2510 Age-related nuclear cataract, unspecified eye: Secondary | ICD-10-CM | POA: Diagnosis not present

## 2013-05-05 DIAGNOSIS — R634 Abnormal weight loss: Secondary | ICD-10-CM | POA: Diagnosis not present

## 2013-05-05 DIAGNOSIS — G2581 Restless legs syndrome: Secondary | ICD-10-CM | POA: Diagnosis not present

## 2013-05-05 DIAGNOSIS — D649 Anemia, unspecified: Secondary | ICD-10-CM | POA: Diagnosis not present

## 2013-05-05 DIAGNOSIS — G4700 Insomnia, unspecified: Secondary | ICD-10-CM | POA: Diagnosis not present

## 2013-05-05 DIAGNOSIS — F329 Major depressive disorder, single episode, unspecified: Secondary | ICD-10-CM | POA: Diagnosis not present

## 2013-05-07 DIAGNOSIS — H2510 Age-related nuclear cataract, unspecified eye: Secondary | ICD-10-CM | POA: Diagnosis not present

## 2013-05-07 DIAGNOSIS — H269 Unspecified cataract: Secondary | ICD-10-CM | POA: Diagnosis not present

## 2013-06-09 DIAGNOSIS — M47817 Spondylosis without myelopathy or radiculopathy, lumbosacral region: Secondary | ICD-10-CM | POA: Diagnosis not present

## 2013-06-09 DIAGNOSIS — M549 Dorsalgia, unspecified: Secondary | ICD-10-CM | POA: Diagnosis not present

## 2013-07-05 DIAGNOSIS — M1710 Unilateral primary osteoarthritis, unspecified knee: Secondary | ICD-10-CM | POA: Diagnosis not present

## 2013-07-06 DIAGNOSIS — M47817 Spondylosis without myelopathy or radiculopathy, lumbosacral region: Secondary | ICD-10-CM | POA: Diagnosis not present

## 2013-07-06 DIAGNOSIS — M549 Dorsalgia, unspecified: Secondary | ICD-10-CM | POA: Diagnosis not present

## 2013-07-06 DIAGNOSIS — M545 Low back pain, unspecified: Secondary | ICD-10-CM | POA: Diagnosis not present

## 2013-07-21 DIAGNOSIS — R972 Elevated prostate specific antigen [PSA]: Secondary | ICD-10-CM | POA: Diagnosis not present

## 2013-07-21 DIAGNOSIS — N138 Other obstructive and reflux uropathy: Secondary | ICD-10-CM | POA: Diagnosis not present

## 2013-07-21 DIAGNOSIS — N401 Enlarged prostate with lower urinary tract symptoms: Secondary | ICD-10-CM | POA: Diagnosis not present

## 2013-07-21 DIAGNOSIS — R351 Nocturia: Secondary | ICD-10-CM | POA: Diagnosis not present

## 2013-09-02 DIAGNOSIS — M542 Cervicalgia: Secondary | ICD-10-CM | POA: Diagnosis not present

## 2013-09-02 DIAGNOSIS — M4802 Spinal stenosis, cervical region: Secondary | ICD-10-CM | POA: Diagnosis not present

## 2013-09-02 DIAGNOSIS — M549 Dorsalgia, unspecified: Secondary | ICD-10-CM | POA: Diagnosis not present

## 2013-09-02 DIAGNOSIS — M4712 Other spondylosis with myelopathy, cervical region: Secondary | ICD-10-CM | POA: Diagnosis not present

## 2013-09-15 DIAGNOSIS — R55 Syncope and collapse: Secondary | ICD-10-CM | POA: Diagnosis not present

## 2013-09-15 DIAGNOSIS — R259 Unspecified abnormal involuntary movements: Secondary | ICD-10-CM | POA: Diagnosis not present

## 2013-09-15 DIAGNOSIS — F329 Major depressive disorder, single episode, unspecified: Secondary | ICD-10-CM | POA: Diagnosis not present

## 2013-11-05 DIAGNOSIS — F329 Major depressive disorder, single episode, unspecified: Secondary | ICD-10-CM | POA: Diagnosis not present

## 2013-11-05 DIAGNOSIS — M2550 Pain in unspecified joint: Secondary | ICD-10-CM | POA: Diagnosis not present

## 2013-11-05 DIAGNOSIS — Z23 Encounter for immunization: Secondary | ICD-10-CM | POA: Diagnosis not present

## 2013-11-18 DIAGNOSIS — M545 Low back pain: Secondary | ICD-10-CM | POA: Diagnosis not present

## 2013-11-18 DIAGNOSIS — M47817 Spondylosis without myelopathy or radiculopathy, lumbosacral region: Secondary | ICD-10-CM | POA: Diagnosis not present

## 2013-11-29 DIAGNOSIS — F321 Major depressive disorder, single episode, moderate: Secondary | ICD-10-CM | POA: Diagnosis not present

## 2013-12-08 DIAGNOSIS — M47816 Spondylosis without myelopathy or radiculopathy, lumbar region: Secondary | ICD-10-CM | POA: Diagnosis not present

## 2013-12-08 DIAGNOSIS — M4712 Other spondylosis with myelopathy, cervical region: Secondary | ICD-10-CM | POA: Diagnosis not present

## 2013-12-08 DIAGNOSIS — M4802 Spinal stenosis, cervical region: Secondary | ICD-10-CM | POA: Diagnosis not present

## 2013-12-08 DIAGNOSIS — Z6825 Body mass index (BMI) 25.0-25.9, adult: Secondary | ICD-10-CM | POA: Diagnosis not present

## 2013-12-08 DIAGNOSIS — M545 Low back pain: Secondary | ICD-10-CM | POA: Diagnosis not present

## 2013-12-16 DIAGNOSIS — F321 Major depressive disorder, single episode, moderate: Secondary | ICD-10-CM | POA: Diagnosis not present

## 2013-12-16 DIAGNOSIS — F324 Major depressive disorder, single episode, in partial remission: Secondary | ICD-10-CM | POA: Diagnosis not present

## 2013-12-16 DIAGNOSIS — G479 Sleep disorder, unspecified: Secondary | ICD-10-CM | POA: Diagnosis not present

## 2013-12-28 DIAGNOSIS — F321 Major depressive disorder, single episode, moderate: Secondary | ICD-10-CM | POA: Diagnosis not present

## 2014-01-17 DIAGNOSIS — F321 Major depressive disorder, single episode, moderate: Secondary | ICD-10-CM | POA: Diagnosis not present

## 2014-01-20 DIAGNOSIS — Z85828 Personal history of other malignant neoplasm of skin: Secondary | ICD-10-CM | POA: Diagnosis not present

## 2014-01-20 DIAGNOSIS — L821 Other seborrheic keratosis: Secondary | ICD-10-CM | POA: Diagnosis not present

## 2014-01-25 DIAGNOSIS — F321 Major depressive disorder, single episode, moderate: Secondary | ICD-10-CM | POA: Diagnosis not present

## 2014-02-01 DIAGNOSIS — F321 Major depressive disorder, single episode, moderate: Secondary | ICD-10-CM | POA: Diagnosis not present

## 2014-02-08 DIAGNOSIS — F321 Major depressive disorder, single episode, moderate: Secondary | ICD-10-CM | POA: Diagnosis not present

## 2014-02-09 DIAGNOSIS — K219 Gastro-esophageal reflux disease without esophagitis: Secondary | ICD-10-CM | POA: Diagnosis not present

## 2014-02-09 DIAGNOSIS — D649 Anemia, unspecified: Secondary | ICD-10-CM | POA: Diagnosis not present

## 2014-02-09 DIAGNOSIS — Z79899 Other long term (current) drug therapy: Secondary | ICD-10-CM | POA: Diagnosis not present

## 2014-02-09 DIAGNOSIS — G4700 Insomnia, unspecified: Secondary | ICD-10-CM | POA: Diagnosis not present

## 2014-02-09 DIAGNOSIS — Z0001 Encounter for general adult medical examination with abnormal findings: Secondary | ICD-10-CM | POA: Diagnosis not present

## 2014-02-09 DIAGNOSIS — Z23 Encounter for immunization: Secondary | ICD-10-CM | POA: Diagnosis not present

## 2014-02-09 DIAGNOSIS — Z136 Encounter for screening for cardiovascular disorders: Secondary | ICD-10-CM | POA: Diagnosis not present

## 2014-02-09 DIAGNOSIS — F329 Major depressive disorder, single episode, unspecified: Secondary | ICD-10-CM | POA: Diagnosis not present

## 2014-02-09 DIAGNOSIS — M5090 Cervical disc disorder, unspecified, unspecified cervical region: Secondary | ICD-10-CM | POA: Diagnosis not present

## 2014-02-15 DIAGNOSIS — F321 Major depressive disorder, single episode, moderate: Secondary | ICD-10-CM | POA: Diagnosis not present

## 2014-03-02 DIAGNOSIS — M47817 Spondylosis without myelopathy or radiculopathy, lumbosacral region: Secondary | ICD-10-CM | POA: Diagnosis not present

## 2014-03-02 DIAGNOSIS — M47816 Spondylosis without myelopathy or radiculopathy, lumbar region: Secondary | ICD-10-CM | POA: Diagnosis not present

## 2014-03-02 DIAGNOSIS — M545 Low back pain: Secondary | ICD-10-CM | POA: Diagnosis not present

## 2014-03-02 DIAGNOSIS — M4802 Spinal stenosis, cervical region: Secondary | ICD-10-CM | POA: Diagnosis not present

## 2014-03-02 DIAGNOSIS — M4712 Other spondylosis with myelopathy, cervical region: Secondary | ICD-10-CM | POA: Diagnosis not present

## 2014-03-03 DIAGNOSIS — F321 Major depressive disorder, single episode, moderate: Secondary | ICD-10-CM | POA: Diagnosis not present

## 2014-03-17 DIAGNOSIS — F321 Major depressive disorder, single episode, moderate: Secondary | ICD-10-CM | POA: Diagnosis not present

## 2014-03-23 DIAGNOSIS — F321 Major depressive disorder, single episode, moderate: Secondary | ICD-10-CM | POA: Diagnosis not present

## 2014-03-29 DIAGNOSIS — F321 Major depressive disorder, single episode, moderate: Secondary | ICD-10-CM | POA: Diagnosis not present

## 2014-04-14 DIAGNOSIS — F321 Major depressive disorder, single episode, moderate: Secondary | ICD-10-CM | POA: Diagnosis not present

## 2014-04-24 ENCOUNTER — Other Ambulatory Visit: Payer: Self-pay | Admitting: Neurology

## 2014-04-25 NOTE — Telephone Encounter (Signed)
For further refills - needs to make follow up with Dr. Krista Blue or have PCP take over rx.

## 2014-05-24 DIAGNOSIS — H9313 Tinnitus, bilateral: Secondary | ICD-10-CM | POA: Diagnosis not present

## 2014-05-24 DIAGNOSIS — H903 Sensorineural hearing loss, bilateral: Secondary | ICD-10-CM | POA: Diagnosis not present

## 2014-05-24 DIAGNOSIS — H6123 Impacted cerumen, bilateral: Secondary | ICD-10-CM | POA: Diagnosis not present

## 2014-05-25 DIAGNOSIS — M47817 Spondylosis without myelopathy or radiculopathy, lumbosacral region: Secondary | ICD-10-CM | POA: Diagnosis not present

## 2014-05-25 DIAGNOSIS — M4802 Spinal stenosis, cervical region: Secondary | ICD-10-CM | POA: Diagnosis not present

## 2014-05-25 DIAGNOSIS — M4712 Other spondylosis with myelopathy, cervical region: Secondary | ICD-10-CM | POA: Diagnosis not present

## 2014-05-25 DIAGNOSIS — M47816 Spondylosis without myelopathy or radiculopathy, lumbar region: Secondary | ICD-10-CM | POA: Diagnosis not present

## 2014-05-25 DIAGNOSIS — Z6827 Body mass index (BMI) 27.0-27.9, adult: Secondary | ICD-10-CM | POA: Diagnosis not present

## 2014-05-25 DIAGNOSIS — M545 Low back pain: Secondary | ICD-10-CM | POA: Diagnosis not present

## 2014-05-26 DIAGNOSIS — F321 Major depressive disorder, single episode, moderate: Secondary | ICD-10-CM | POA: Diagnosis not present

## 2014-06-09 DIAGNOSIS — F321 Major depressive disorder, single episode, moderate: Secondary | ICD-10-CM | POA: Diagnosis not present

## 2014-06-17 DIAGNOSIS — R42 Dizziness and giddiness: Secondary | ICD-10-CM | POA: Diagnosis not present

## 2014-06-17 DIAGNOSIS — M79669 Pain in unspecified lower leg: Secondary | ICD-10-CM | POA: Diagnosis not present

## 2014-06-17 DIAGNOSIS — D649 Anemia, unspecified: Secondary | ICD-10-CM | POA: Diagnosis not present

## 2014-06-22 DIAGNOSIS — F321 Major depressive disorder, single episode, moderate: Secondary | ICD-10-CM | POA: Diagnosis not present

## 2014-07-04 DIAGNOSIS — R339 Retention of urine, unspecified: Secondary | ICD-10-CM | POA: Diagnosis not present

## 2014-07-04 DIAGNOSIS — N401 Enlarged prostate with lower urinary tract symptoms: Secondary | ICD-10-CM | POA: Diagnosis not present

## 2014-07-11 DIAGNOSIS — R339 Retention of urine, unspecified: Secondary | ICD-10-CM | POA: Diagnosis not present

## 2014-07-11 DIAGNOSIS — N400 Enlarged prostate without lower urinary tract symptoms: Secondary | ICD-10-CM | POA: Diagnosis not present

## 2014-07-12 DIAGNOSIS — R339 Retention of urine, unspecified: Secondary | ICD-10-CM | POA: Diagnosis not present

## 2014-07-19 DIAGNOSIS — F321 Major depressive disorder, single episode, moderate: Secondary | ICD-10-CM | POA: Diagnosis not present

## 2014-07-29 DIAGNOSIS — N401 Enlarged prostate with lower urinary tract symptoms: Secondary | ICD-10-CM | POA: Diagnosis not present

## 2014-07-29 DIAGNOSIS — R339 Retention of urine, unspecified: Secondary | ICD-10-CM | POA: Diagnosis not present

## 2014-08-04 DIAGNOSIS — F321 Major depressive disorder, single episode, moderate: Secondary | ICD-10-CM | POA: Diagnosis not present

## 2014-08-11 DIAGNOSIS — M79604 Pain in right leg: Secondary | ICD-10-CM | POA: Diagnosis not present

## 2014-08-15 DIAGNOSIS — F321 Major depressive disorder, single episode, moderate: Secondary | ICD-10-CM | POA: Diagnosis not present

## 2014-08-17 DIAGNOSIS — M47816 Spondylosis without myelopathy or radiculopathy, lumbar region: Secondary | ICD-10-CM | POA: Diagnosis not present

## 2014-08-17 DIAGNOSIS — M47817 Spondylosis without myelopathy or radiculopathy, lumbosacral region: Secondary | ICD-10-CM | POA: Diagnosis not present

## 2014-08-17 DIAGNOSIS — M545 Low back pain: Secondary | ICD-10-CM | POA: Diagnosis not present

## 2014-08-17 DIAGNOSIS — M4802 Spinal stenosis, cervical region: Secondary | ICD-10-CM | POA: Diagnosis not present

## 2014-08-17 DIAGNOSIS — M4712 Other spondylosis with myelopathy, cervical region: Secondary | ICD-10-CM | POA: Diagnosis not present

## 2014-08-31 DIAGNOSIS — F321 Major depressive disorder, single episode, moderate: Secondary | ICD-10-CM | POA: Diagnosis not present

## 2014-09-13 ENCOUNTER — Other Ambulatory Visit: Payer: Self-pay | Admitting: Neurology

## 2014-09-15 ENCOUNTER — Telehealth: Payer: Self-pay | Admitting: Neurology

## 2014-09-15 ENCOUNTER — Other Ambulatory Visit: Payer: Self-pay | Admitting: *Deleted

## 2014-09-15 DIAGNOSIS — G629 Polyneuropathy, unspecified: Secondary | ICD-10-CM

## 2014-09-15 MED ORDER — GABAPENTIN 400 MG PO CAPS: 400.0000 mg | ORAL_CAPSULE | Freq: Four times a day (QID) | ORAL | Status: DC

## 2014-09-15 NOTE — Telephone Encounter (Signed)
Spoke to patient - appt scheduled - enough medication sent to pharmacy to last until his appt.

## 2014-09-15 NOTE — Telephone Encounter (Signed)
Pt called to have Rx gabapentin (NEURONTIN) 400 MG capsule refilled. Pharmacy told pt that they could not refill it for him. He is willing to come in for a f/u if need be. However , he only has enough medication to last him till July 30th ( Saturday). Please call and advise [phone]

## 2014-09-27 DIAGNOSIS — M47816 Spondylosis without myelopathy or radiculopathy, lumbar region: Secondary | ICD-10-CM | POA: Diagnosis not present

## 2014-09-27 DIAGNOSIS — M545 Low back pain: Secondary | ICD-10-CM | POA: Diagnosis not present

## 2014-10-05 DIAGNOSIS — F321 Major depressive disorder, single episode, moderate: Secondary | ICD-10-CM | POA: Diagnosis not present

## 2014-10-06 DIAGNOSIS — M79604 Pain in right leg: Secondary | ICD-10-CM | POA: Diagnosis not present

## 2014-10-06 DIAGNOSIS — L309 Dermatitis, unspecified: Secondary | ICD-10-CM | POA: Diagnosis not present

## 2014-10-06 DIAGNOSIS — R252 Cramp and spasm: Secondary | ICD-10-CM | POA: Diagnosis not present

## 2014-10-26 ENCOUNTER — Encounter: Payer: Self-pay | Admitting: Neurology

## 2014-10-26 ENCOUNTER — Ambulatory Visit (INDEPENDENT_AMBULATORY_CARE_PROVIDER_SITE_OTHER): Payer: Medicare Other | Admitting: Neurology

## 2014-10-26 VITALS — BP 108/67 | HR 72 | Ht 73.0 in | Wt 199.0 lb

## 2014-10-26 DIAGNOSIS — G629 Polyneuropathy, unspecified: Secondary | ICD-10-CM | POA: Diagnosis not present

## 2014-10-26 MED ORDER — GABAPENTIN 400 MG PO CAPS: ORAL_CAPSULE | ORAL | Status: DC

## 2014-10-26 NOTE — Progress Notes (Signed)
PATIENT: Dustin [name] DOB: 1940-09-17  Chief Complaint  Patient presents with  . Peripheral Neuropathy    His neuropathy has worsened over the last year.  He is taking gabapentin 400mg  four times daily but it is no longer doing a good job of controlling his symptoms.  He has also started having muscle cramps in his bilateral lower extremities.     HISTORICAL  Dustin [name] 74 yo LH male, His primary care physician is Dr. Lujean Amel at [facility]  Mr. Guerrette was seen previously for mild axonal peripheral neuropathy, he presented with bilateral feet plantar surface paresthesias since 2012, EMG nerve conduction study showed mild axonal peripheral neuropathy  Extensive laboratory evaluation failed to demonstrate etiology  He has been taking gabapentin 400 mg 4 times a day, which has been very effective in controlling his symptoms until June 2016, he noticed recurrent symptoms, bilateral plantar feet numbness tingling needles sensation, especially at the end of the day, evening time from 6 PM to 10 PM before he goes to bed, getting worse after bearing weight,  He had history of depression, taking seroquel 400 mg every night no trouble sleeping, also had a history of cervical decompression surgery in the past for neck pain, which has been successful.  He has history of chronic low back pain, no radiating pain, has been receiving epidural injection every few months, which was helpful, he denies gait difficulty, he denies bowel and bladder incontinence.  REVIEW OF SYSTEMS: Full 14 system review of systems performed and notable only for depression, neck pain, muscle cramps  ALLERGIES: No Known Allergies  HOME MEDICATIONS: Current Outpatient Prescriptions  Medication Sig Dispense Refill  . alfuzosin (UROXATRAL) 10 MG 24 hr tablet 10 mg daily.    Marland Kitchen aspirin 81 MG tablet Take 81 mg by mouth daily.      Marland Kitchen buPROPion (WELLBUTRIN XL) 150 MG 24 hr tablet Take 150 mg by mouth 2  (two) times daily.    . cholecalciferol (VITAMIN D) 1000 UNITS tablet Take 2,000 Units by mouth daily. Takes 2000 iu     . finasteride (PROSCAR) 5 MG tablet Take 5 mg by mouth daily.      Marland Kitchen gabapentin (NEURONTIN) 400 MG capsule Take 1 capsule (400 mg total) by mouth 4 (four) times daily. 120 capsule 1  . oxyCODONE-acetaminophen (PERCOCET) 10-325 MG per tablet 10-325 tablets as needed.    . OXYCONTIN 20 MG T12A 12 hr tablet TAKE 1 TABLET BY ORAL ROUTE EVERY 12 HOURS (DNF 09/24/14)  0  . pantoprazole (PROTONIX) 40 MG tablet Take 40 mg by mouth daily.      . QUEtiapine (SEROQUEL) 400 MG tablet TAKE 1 TABLET BY MOUTH 1 HOUR BEFORE BED  1   No current facility-administered medications for this visit.    PAST MEDICAL HISTORY: Past Medical History  Diagnosis Date  . GERD (gastroesophageal reflux disease)     protonix for control  . Insomnia   . BPH (benign prostatic hyperplasia)   . Neuropathy   . Neck pain     PAST SURGICAL HISTORY: Past Surgical History  Procedure Laterality Date  . Shoulder arthroscopy  2010    rt shoulder  . Shoulder arthroscopy  02/15/2011    Procedure: ARTHROSCOPY SHOULDER;  Surgeon: Cynda Familia;  Location: [facility];  Service: Orthopedics;  Laterality: Right;  Debridement  . Tonsillectomy    . Anterior cervical decompression/discectomy fusion 4 levels N/A 10/27/2012    Procedure: Cervical  Three-Four Cervical Four-Five Cervical Five-Six Cervical Six-Seven  Anterior cervical decompression/diskectomy/fusion;  Surgeon: Erline Levine, MD;  Location: [facility];  Service: Neurosurgery;  Laterality: N/A;  Cervical Three-Four Cervical Four-Five Cervical Five-Six Cervical Six-Seven  Anterior cervical decompression/diskectomy/fusion    FAMILY HISTORY: No family history on file.  SOCIAL HISTORY:  Social History   Social History  . Marital Status: Married    Spouse Name: Dustin [name]  . Number of Children: 2  . Years of Education: college    Occupational History  . retired    Social History Main Topics  . Smoking status: Former Smoker -- 1.50 packs/day    Quit date: 01/27/1990  . Smokeless tobacco: Never Used  . Alcohol Use: No  . Drug Use: No  . Sexual Activity: Not on file   Other Topics Concern  . Not on file   Social History Narrative   Pt lives home with wife Dustin [name])   Pt is retired   Education college    Pt is left handed   Pt consumes 2 cups of coffee daily     PHYSICAL EXAM   Filed Vitals:   10/26/14 0940  BP: 108/67  Pulse: 72  Height: 6\' 1"  (1.854 m)  Weight: 199 lb (90.266 kg)    Not recorded      Body mass index is 26.26 kg/(m^2).  PHYSICAL EXAMNIATION:  Gen: NAD, conversant, well nourised, obese, well groomed                     Cardiovascular: Regular rate rhythm, no peripheral edema, warm, nontender. Eyes: Conjunctivae clear without exudates or hemorrhage Neck: Supple, no carotid bruise. Pulmonary: Clear to auscultation bilaterally   NEUROLOGICAL EXAM:  MENTAL STATUS: Speech:    Speech is normal; fluent and spontaneous with normal comprehension.  Cognition:     Orientation to time, place and person     Normal recent and remote memory     Normal Attention span and concentration     Normal Language, naming, repeating,spontaneous speech     Fund of knowledge   CRANIAL NERVES: CN II: Visual fields are full to confrontation. Fundoscopic exam is normal with sharp discs and no vascular changes. Pupils are round equal and briskly reactive to light. CN III, IV, VI: extraocular movement are normal. No ptosis. CN V: Facial sensation is intact to pinprick in all 3 divisions bilaterally. Corneal responses are intact.  CN VII: Face is symmetric with normal eye closure and smile. CN VIII: Hearing is normal to rubbing fingers CN IX, X: Palate elevates symmetrically. Phonation is normal. CN XI: Head turning and shoulder shrug are intact CN XII: Tongue is midline with normal movements  and no atrophy.  MOTOR: There is no pronator drift of out-stretched arms. Muscle bulk and tone are normal. Muscle strength is normal.  REFLEXES: Reflexes are 2+ and symmetric at the biceps, triceps, knees, and ankles. Plantar responses are flexor.  SENSORY: Intact to light touch, pinprick, position sense at toes and fingers, decreased vibratory sensation at toes, last 2 seconds   COORDINATION: Rapid alternating movements and fine finger movements are intact. There is no dysmetria on finger-to-nose and heel-knee-shin.    GAIT/STANCE: Posture is normal. Gait is steady with normal steps, base, arm swing, and turning. Heel and toe walking are normal. Tandem gait is normal.  Romberg is absent.   DIAGNOSTIC DATA (LABS, IMAGING, TESTING) - I reviewed patient records, labs, notes, testing and imaging myself where available.   ASSESSMENT AND  PLAN  LADAVION SAVITZ is a 74 y.o. male   Mild axonal peripheral neuropathy  He also has history of chronic low back pain, worsening bilateral feet paresthesia could also represent lumbar radiculopathy, after discussed with patient, will hold off  evaluation at this point  Gabapentin 400 mg at 7, 12, 1700, refilled prescription  Return to clinic in 3 months     Marcial Pacas, M.D. Ph.D.  [facility] [address], [place], [place] [address] Ph: [phone] Fax: [phone]  CC: Referring Provider

## 2014-10-27 DIAGNOSIS — M4712 Other spondylosis with myelopathy, cervical region: Secondary | ICD-10-CM | POA: Diagnosis not present

## 2014-10-27 DIAGNOSIS — M47817 Spondylosis without myelopathy or radiculopathy, lumbosacral region: Secondary | ICD-10-CM | POA: Diagnosis not present

## 2014-10-27 DIAGNOSIS — Z6826 Body mass index (BMI) 26.0-26.9, adult: Secondary | ICD-10-CM | POA: Diagnosis not present

## 2014-10-27 DIAGNOSIS — M4802 Spinal stenosis, cervical region: Secondary | ICD-10-CM | POA: Diagnosis not present

## 2014-10-27 DIAGNOSIS — M47816 Spondylosis without myelopathy or radiculopathy, lumbar region: Secondary | ICD-10-CM | POA: Diagnosis not present

## 2014-10-27 DIAGNOSIS — M545 Low back pain: Secondary | ICD-10-CM | POA: Diagnosis not present

## 2014-10-31 DIAGNOSIS — Z23 Encounter for immunization: Secondary | ICD-10-CM | POA: Diagnosis not present

## 2014-11-08 DIAGNOSIS — F321 Major depressive disorder, single episode, moderate: Secondary | ICD-10-CM | POA: Diagnosis not present

## 2014-11-23 DIAGNOSIS — R351 Nocturia: Secondary | ICD-10-CM | POA: Diagnosis not present

## 2014-11-23 DIAGNOSIS — N401 Enlarged prostate with lower urinary tract symptoms: Secondary | ICD-10-CM | POA: Diagnosis not present

## 2014-11-23 DIAGNOSIS — N138 Other obstructive and reflux uropathy: Secondary | ICD-10-CM | POA: Diagnosis not present

## 2014-11-23 DIAGNOSIS — R339 Retention of urine, unspecified: Secondary | ICD-10-CM | POA: Diagnosis not present

## 2014-11-23 DIAGNOSIS — N390 Urinary tract infection, site not specified: Secondary | ICD-10-CM | POA: Diagnosis not present

## 2014-11-30 DIAGNOSIS — F321 Major depressive disorder, single episode, moderate: Secondary | ICD-10-CM | POA: Diagnosis not present

## 2014-12-01 DIAGNOSIS — M170 Bilateral primary osteoarthritis of knee: Secondary | ICD-10-CM | POA: Diagnosis not present

## 2014-12-01 DIAGNOSIS — M1711 Unilateral primary osteoarthritis, right knee: Secondary | ICD-10-CM | POA: Diagnosis not present

## 2014-12-01 DIAGNOSIS — M1712 Unilateral primary osteoarthritis, left knee: Secondary | ICD-10-CM | POA: Diagnosis not present

## 2014-12-05 ENCOUNTER — Telehealth: Payer: Self-pay | Admitting: Neurology

## 2014-12-05 MED ORDER — OXCARBAZEPINE 150 MG PO TABS
150.0000 mg | ORAL_TABLET | Freq: Two times a day (BID) | ORAL | Status: DC
Start: 2014-12-05 — End: 2015-03-28

## 2014-12-05 NOTE — Telephone Encounter (Signed)
Dustin [name], please call patient, I have add on Trileptal 150 mg, half tablets twice a day for 1 week, then 1 tablet twice a day along with gabapentin 400 mg 4 tablets a day, to see this help his feet better, he might experience increased dizziness, lightheadedness, taking 2 medications together.

## 2014-12-05 NOTE — Telephone Encounter (Signed)
He is agreeable to the new medication and verbalized understanding of the titration instructions.  He will call with any further concerns.

## 2014-12-05 NOTE — Telephone Encounter (Signed)
Pt called and states that both his feet are hurting very badly. He is asking for a call back . Please call and advise [phone]

## 2014-12-05 NOTE — Telephone Encounter (Signed)
Reports his symptoms have worsened - he is currently taking gabapentin 400mg , one cap at 7, one cap at 12 and two caps at 5 - feels this dosage is no longer helpful.

## 2014-12-20 DIAGNOSIS — F321 Major depressive disorder, single episode, moderate: Secondary | ICD-10-CM | POA: Diagnosis not present

## 2014-12-26 ENCOUNTER — Encounter: Payer: Self-pay | Admitting: Neurology

## 2014-12-26 ENCOUNTER — Ambulatory Visit (INDEPENDENT_AMBULATORY_CARE_PROVIDER_SITE_OTHER): Payer: Medicare Other | Admitting: Neurology

## 2014-12-26 ENCOUNTER — Telehealth: Payer: Self-pay | Admitting: Neurology

## 2014-12-26 VITALS — BP 113/65 | HR 74 | Ht 73.0 in | Wt 205.0 lb

## 2014-12-26 DIAGNOSIS — G6289 Other specified polyneuropathies: Secondary | ICD-10-CM

## 2014-12-26 DIAGNOSIS — G608 Other hereditary and idiopathic neuropathies: Secondary | ICD-10-CM

## 2014-12-26 MED ORDER — PREGABALIN 100 MG PO CAPS: ORAL_CAPSULE | ORAL | Status: DC

## 2014-12-26 NOTE — Telephone Encounter (Signed)
Patient called, was here earlier today. Not sure if Dr. Krista Blue wanted him to stop taking Gabapentin or to continue it. Also OXcarbazepine (TRILEPTAL) 150 MG tablet whether or not to continue taking? Please call patient to advise.

## 2014-12-26 NOTE — Progress Notes (Signed)
Chief Complaint  Patient presents with  . Peripheral Neuropathy    Reports no improvement in symptoms with the the addition of Trileptal 150mg , BID.  He has been taking Trileptal along with gabapentin 400mg , four capsules daily.      PATIENT: Dustin [name] DOB: 05/24/1940  Chief Complaint  Patient presents with  . Peripheral Neuropathy    Reports no improvement in symptoms with the the addition of Trileptal 150mg , BID.  He has been taking Trileptal along with gabapentin 400mg , four capsules daily.     HISTORICAL  Dustin [name] 74 yo handed male, His primary care physician is Dr. Lujean Amel at [facility]  [name] was seen previously for mild axonal peripheral neuropathy, he presented with bilateral feet plantar surface paresthesias since 2012, EMG nerve conduction study showed mild axonal peripheral neuropathy  Extensive laboratory evaluation failed to demonstrate etiology  He has been taking gabapentin 400 mg 4 times a day, which has been very effective in controlling his symptoms until June 2016, he noticed recurrent symptoms, bilateral plantar feet numbness tingling needles sensation, especially at the end of the day, evening time from 6 PM to 10 PM before he goes to bed, getting worse after bearing weight,  He had history of depression, taking seroquel 400 mg every night no trouble sleeping, also had a history of cervical decompression surgery in the past for neck pain, which has been successful.  He has history of chronic low back pain, no radiating pain, has been receiving epidural injection every few months, which was helpful, he denies gait difficulty, he denies bowel and bladder incontinence.  UPDATE Dec 26 2014: He continue complains unbearable bilateral feet pain, especially by evening time, he has been taking gabapentin 400 mg 1/1/2 tablets, and Trileptal 150 mg twice a day without significant improvement  He denies low back pain, no gait difficulty, no  bowel bladder incontinence, today's examination find mild bilateral toe extension to flexion weakness, areflexia, he decided to try different medication, hold of suggested evaluation such as EMG nerve conduction study MRI of lumbar at this point   REVIEW OF SYSTEMS: Full 14 system review of systems performed and notable only for depression, neck pain, muscle cramps  ALLERGIES: No Known Allergies  HOME MEDICATIONS: Current Outpatient Prescriptions  Medication Sig Dispense Refill  . alfuzosin (UROXATRAL) 10 MG 24 hr tablet 10 mg daily.    Marland Kitchen aspirin 81 MG tablet Take 81 mg by mouth daily.      Marland Kitchen buPROPion (WELLBUTRIN XL) 150 MG 24 hr tablet Take 150 mg by mouth 2 (two) times daily.    . cholecalciferol (VITAMIN D) 1000 UNITS tablet Take 2,000 Units by mouth daily. Takes 2000 iu     . desmopressin (DDAVP) 0.2 MG tablet Take by mouth at bedtime.  1  . finasteride (PROSCAR) 5 MG tablet Take 5 mg by mouth daily.      Marland Kitchen gabapentin (NEURONTIN) 400 MG capsule One at 7am, one at noon, 2 tabs at 17:00. 120 capsule 11  . OXcarbazepine (TRILEPTAL) 150 MG tablet Take 1 tablet (150 mg total) by mouth 2 (two) times daily. 60 tablet 6  . oxyCODONE-acetaminophen (PERCOCET) 10-325 MG per tablet 10-325 tablets as needed.    . OXYCONTIN 20 MG T12A 12 hr tablet TAKE 1 TABLET BY ORAL ROUTE EVERY 12 HOURS (DNF 09/24/14)  0  . pantoprazole (PROTONIX) 40 MG tablet Take 40 mg by mouth daily.      . QUEtiapine (SEROQUEL) 400 MG  tablet TAKE 1 TABLET BY MOUTH 1 HOUR BEFORE BED  1   No current facility-administered medications for this visit.    PAST MEDICAL HISTORY: Past Medical History  Diagnosis Date  . GERD (gastroesophageal reflux disease)     protonix for control  . Insomnia   . BPH (benign prostatic hyperplasia)   . Neuropathy ([place])   . Neck pain     PAST SURGICAL HISTORY: Past Surgical History  Procedure Laterality Date  . Shoulder arthroscopy  2010    rt shoulder  . Shoulder arthroscopy  02/15/2011     Procedure: ARTHROSCOPY SHOULDER;  Surgeon: Cynda Familia;  Location: [facility];  Service: Orthopedics;  Laterality: Right;  Debridement  . Tonsillectomy    . Anterior cervical decompression/discectomy fusion 4 levels N/A 10/27/2012    Procedure: Cervical Three-Four Cervical Four-Five Cervical Five-Six Cervical Six-Seven  Anterior cervical decompression/diskectomy/fusion;  Surgeon: Erline Levine, MD;  Location: [facility];  Service: Neurosurgery;  Laterality: N/A;  Cervical Three-Four Cervical Four-Five Cervical Five-Six Cervical Six-Seven  Anterior cervical decompression/diskectomy/fusion    FAMILY HISTORY: No family history on file.  SOCIAL HISTORY:  Social History   Social History  . Marital Status: Married    Spouse Name: [name]  . Number of Children: 2  . Years of Education: college   Occupational History  . retired    Social History Main Topics  . Smoking status: Former Smoker -- 1.50 packs/day    Quit date: 01/27/1990  . Smokeless tobacco: Never Used  . Alcohol Use: No  . Drug Use: No  . Sexual Activity: Not on file   Other Topics Concern  . Not on file   Social History Narrative   [name]   Pt is retired   Education college    Pt is left handed   Pt consumes 2 cups of coffee daily     PHYSICAL EXAM   Filed Vitals:   12/26/14 0905  BP: 113/65  Pulse: 74  Height: 6\' 1"  (1.854 m)  Weight: 205 lb (92.987 kg)    Not recorded      Body mass index is 27.05 kg/(m^2).  PHYSICAL EXAMNIATION:  Gen: NAD, conversant, well nourised, obese, well groomed                     Cardiovascular: Regular rate rhythm, no peripheral edema, warm, nontender. Eyes: Conjunctivae clear without exudates or hemorrhage Neck: Supple, no carotid bruise. Pulmonary: Clear to auscultation bilaterally   NEUROLOGICAL EXAM:  MENTAL STATUS: Speech:    Speech is normal; fluent and spontaneous with normal comprehension.   Cognition:     Orientation to time, place and person     Normal recent and remote memory     Normal Attention span and concentration     Normal Language, naming, repeating,spontaneous speech     Fund of knowledge   CRANIAL NERVES: CN II: Visual fields are full to confrontation. Fundoscopic exam is normal with sharp discs and no vascular changes. Pupils are round equal and briskly reactive to light. CN III, IV, VI: extraocular movement are normal. No ptosis. CN V: Facial sensation is intact to pinprick in all 3 divisions bilaterally. Corneal responses are intact.  CN VII: Face is symmetric with normal eye closure and smile. CN VIII: Hearing is normal to rubbing fingers CN IX, X: Palate elevates symmetrically. Phonation is normal. CN XI: Head turning and shoulder shrug are intact CN XII: Tongue is  midline with normal movements and no atrophy.  MOTOR: There is no pronator drift of out-stretched arms. Muscle bulk and tone are normal. Muscle strength is normal, with exception of mild bilateral toe flexion extension weakness.  REFLEXES: He was diffusely areflexia, Plantar responses are flexor.  SENSORY: Length dependent decreased to light touch, pinprick, vibratory sensation at ankle level  COORDINATION: Rapid alternating movements and fine finger movements are intact. There is no dysmetria on finger-to-nose and heel-knee-shin.    GAIT/STANCE: Posture is normal. Gait is steady with normal steps, base, arm swing, and turning. He is not able to walk on tiptoe or heels   DIAGNOSTIC DATA (LABS, IMAGING, TESTING) - I reviewed patient records, labs, notes, testing and imaging myself where available.   ASSESSMENT AND PLAN  Dustin [name] is a 74 y.o. male   Mild axonal peripheral neuropathy  He was areflexia, length dependent sensory changes, mild bilateral toe flexion extension weakness.  I have suggested potentially repeating EMG nerve conduction study, MRI of lumbar, he wants to  hold off at this point Bilateral lower extremity neuropathic pain  He has failed gabapentin 400 mg 4 tablets a day, Trileptal 150 mg twice a day  I will stop gabapentin  Add on Lyrica 100 mg 4 tablets a day    Marcial Pacas, M.D. Ph.D.  [facility] [address], [place], [place] [address] Ph: [phone] Fax: [phone]  CC: Referring Provider

## 2014-12-26 NOTE — Telephone Encounter (Signed)
Spoke to patient and reviewed directions for today's visit - he will stop gabapentin, continue Trileptal and start Lyrica.

## 2014-12-29 NOTE — Telephone Encounter (Signed)
Patient called to check status of Lyrica Rx to [facility]

## 2014-12-29 NOTE — Telephone Encounter (Signed)
It appears this Rx was sent to [facility].  I called the pharmacy.  They have the Rx, however, ins will only pay for 3 caps daily instead of four.  I have contacted ins and provided clinical info, asking for a quantity limit exception.  Request is currently under review Ref # [id number] I called the patient back and relayed this info.  He expressed understanding and appreciation.    [facility]) has approved the request for coverage on Lyrica effective until 02/18/2016 Ref # [id number] Ins has notified patient, and we have notified the pharmacy.

## 2015-01-18 DIAGNOSIS — F321 Major depressive disorder, single episode, moderate: Secondary | ICD-10-CM | POA: Diagnosis not present

## 2015-01-19 DIAGNOSIS — M545 Low back pain: Secondary | ICD-10-CM | POA: Diagnosis not present

## 2015-01-19 DIAGNOSIS — M47817 Spondylosis without myelopathy or radiculopathy, lumbosacral region: Secondary | ICD-10-CM | POA: Diagnosis not present

## 2015-01-19 DIAGNOSIS — M4712 Other spondylosis with myelopathy, cervical region: Secondary | ICD-10-CM | POA: Diagnosis not present

## 2015-01-19 DIAGNOSIS — M4802 Spinal stenosis, cervical region: Secondary | ICD-10-CM | POA: Diagnosis not present

## 2015-01-19 DIAGNOSIS — M47816 Spondylosis without myelopathy or radiculopathy, lumbar region: Secondary | ICD-10-CM | POA: Diagnosis not present

## 2015-01-24 ENCOUNTER — Ambulatory Visit: Payer: Medicare Other | Admitting: Neurology

## 2015-01-25 DIAGNOSIS — Z85828 Personal history of other malignant neoplasm of skin: Secondary | ICD-10-CM | POA: Diagnosis not present

## 2015-01-25 DIAGNOSIS — C44719 Basal cell carcinoma of skin of left lower limb, including hip: Secondary | ICD-10-CM | POA: Diagnosis not present

## 2015-01-25 DIAGNOSIS — C44612 Basal cell carcinoma of skin of right upper limb, including shoulder: Secondary | ICD-10-CM | POA: Diagnosis not present

## 2015-01-25 DIAGNOSIS — L853 Xerosis cutis: Secondary | ICD-10-CM | POA: Diagnosis not present

## 2015-01-25 DIAGNOSIS — L821 Other seborrheic keratosis: Secondary | ICD-10-CM | POA: Diagnosis not present

## 2015-01-25 DIAGNOSIS — D485 Neoplasm of uncertain behavior of skin: Secondary | ICD-10-CM | POA: Diagnosis not present

## 2015-01-26 DIAGNOSIS — F321 Major depressive disorder, single episode, moderate: Secondary | ICD-10-CM | POA: Diagnosis not present

## 2015-01-30 ENCOUNTER — Encounter: Payer: Self-pay | Admitting: Neurology

## 2015-01-30 ENCOUNTER — Ambulatory Visit (INDEPENDENT_AMBULATORY_CARE_PROVIDER_SITE_OTHER): Payer: Medicare Other | Admitting: Neurology

## 2015-01-30 VITALS — BP 121/68 | HR 72 | Ht 73.0 in | Wt 204.0 lb

## 2015-01-30 DIAGNOSIS — R413 Other amnesia: Secondary | ICD-10-CM

## 2015-01-30 DIAGNOSIS — G608 Other hereditary and idiopathic neuropathies: Secondary | ICD-10-CM | POA: Diagnosis not present

## 2015-01-30 DIAGNOSIS — F329 Major depressive disorder, single episode, unspecified: Secondary | ICD-10-CM

## 2015-01-30 DIAGNOSIS — G6289 Other specified polyneuropathies: Secondary | ICD-10-CM

## 2015-01-30 DIAGNOSIS — F32A Depression, unspecified: Secondary | ICD-10-CM | POA: Insufficient documentation

## 2015-01-30 NOTE — Progress Notes (Signed)
Chief Complaint  Patient presents with  . Memory Loss    MMSE 29/30 - 15 animals.  He has started seeing a psychiatrist for depression and has been placed on Wellbutrin.  Reports Dustin [name] felt he needed his memory evaluated.   . Peripheral Neuropathy    He is back to taking Trileptal and gabapentin.  He had added Lyrica but said it made him feel worse.  In talking with him, it was discovered that he was actually taking both Lyrica and gabapentin at the same time.      PATIENT: Dustin [name] DOB: [birth year]  Chief Complaint  Patient presents with  . Memory Loss    MMSE 29/30 - 15 animals.  He has started seeing a psychiatrist for depression and has been placed on Wellbutrin.  Reports Dustin [name] felt he needed his memory evaluated.   . Peripheral Neuropathy    He is back to taking Trileptal and gabapentin.  He had added Lyrica but said it made him feel worse.  In talking with him, it was discovered that he was actually taking both Lyrica and gabapentin at the same time.     HISTORICAL  Dustin [name] 74 yo handed [name], His primary care physician is Dr. Lujean [name] at [facility]  Mr. Dustin [name] was seen previously for mild axonal peripheral neuropathy, he presented with bilateral feet plantar surface paresthesias since 2012, EMG nerve conduction study showed mild axonal peripheral neuropathy  Extensive laboratory evaluation failed to demonstrate etiology  He has been taking gabapentin 400 mg 4 times a day, which has been very effective in controlling his symptoms until June 2016, he noticed recurrent symptoms, bilateral plantar feet numbness tingling needles sensation, especially at the end of the day, evening time from 6 PM to 10 PM before he goes to bed, getting worse after bearing weight,  He had history of depression, taking seroquel 400 mg every night no trouble sleeping, also had a history of cervical decompression surgery in the past for neck pain, which has been  successful.  He has history of chronic low back pain, no radiating pain, has been receiving epidural injection every few months, which was helpful, he denies gait difficulty, he denies bowel and bladder incontinence.  UPDATE Dec 26 2014: He continue complains unbearable bilateral feet pain, especially by evening time, he has been taking gabapentin 400 mg 1/1/2 tablets, and Trileptal 150 mg twice a day without significant improvement  He denies low back pain, no gait difficulty, no bowel bladder incontinence, today's examination find mild bilateral toe extension to flexion weakness, areflexia, he decided to try different medication, hold of suggested evaluation such as EMG nerve conduction study MRI of lumbar at this point  UPDATE Jan 30 2015: He still complains of bilateral feet paresthesia, he took 400 mg of seroquel, help him sleep. He continues to complains of bilateral feet paresthesia. Mostly at evening time, after bearing weight   He also complains of memeoyr trouble. Today's Mini-Mental Status Examination is 29 out of 30  I also reviewed his record from [facility] by Dustin [name] who, with diagnosis of major depressive disorder, moderate.  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  He has no family history of memory loss  I personally reviewed brain in March 2014, mild atrophy. Small vessel disease  REVIEW OF SYSTEMS: Full 14 system review of systems performed and notable only for depression, neck pain, muscle cramps  ALLERGIES: No  Known Allergies  HOME MEDICATIONS: Current Outpatient Prescriptions  Medication Sig Dispense Refill  . alfuzosin (UROXATRAL) 10 MG 24 hr tablet 10 mg daily.    Marland Kitchen aspirin 81 MG tablet Take 81 mg by mouth daily.      Marland Kitchen buPROPion (WELLBUTRIN XL) 150 MG 24 hr tablet Take 150 mg by mouth 2 (two) times daily.    . cholecalciferol (VITAMIN D) 1000 UNITS tablet Take 2,000 Units by mouth daily. Takes 2000 iu     .  desmopressin (DDAVP) 0.2 MG tablet Take by mouth at bedtime.  1  . finasteride (PROSCAR) 5 MG tablet Take 5 mg by mouth daily.      Marland Kitchen gabapentin (NEURONTIN) 400 MG capsule Take 400 mg by mouth 4 (four) times daily.    . OXcarbazepine (TRILEPTAL) 150 MG tablet Take 1 tablet (150 mg total) by mouth 2 (two) times daily. 60 tablet 6  . oxyCODONE-acetaminophen (PERCOCET) 10-325 MG per tablet 10-325 tablets as needed.    . OXYCONTIN 20 MG T12A 12 hr tablet TAKE 1 TABLET BY ORAL ROUTE EVERY 12 HOURS (DNF 09/24/14)  0  . pantoprazole (PROTONIX) 40 MG tablet Take 40 mg by mouth daily.      . pregabalin (LYRICA) 100 MG capsule One in the morning, one at noon, two at evening 120 capsule 6  . QUEtiapine (SEROQUEL) 400 MG tablet TAKE 1 TABLET BY MOUTH 1 HOUR BEFORE BED  1   No current facility-administered medications for this visit.    PAST MEDICAL HISTORY: Past Medical History  Diagnosis Date  . GERD (gastroesophageal reflux disease)     protonix for control  . Insomnia   . BPH (benign prostatic hyperplasia)   . Neuropathy ([place])   . Neck pain     PAST SURGICAL HISTORY: Past Surgical History  Procedure Laterality Date  . Shoulder arthroscopy  2010    rt shoulder  . Shoulder arthroscopy  02/15/2011    Procedure: ARTHROSCOPY SHOULDER;  Surgeon: Cynda Familia;  Location: [facility];  Service: Orthopedics;  Laterality: Right;  Debridement  . Tonsillectomy    . Anterior cervical decompression/discectomy fusion 4 levels N/A 10/27/2012    Procedure: Cervical Three-Four Cervical Four-Five Cervical Five-Six Cervical Six-Seven  Anterior cervical decompression/diskectomy/fusion;  Surgeon: Erline Levine, MD;  Location: [facility];  Service: Neurosurgery;  Laterality: N/A;  Cervical Three-Four Cervical Four-Five Cervical Five-Six Cervical Six-Seven  Anterior cervical decompression/diskectomy/fusion    FAMILY HISTORY: No family history on file.  SOCIAL HISTORY:  Social History    Social History  . Marital Status: Married    Spouse Name: Dustin [name]  . Number of Children: 2  . Years of Education: college   Occupational History  . retired    Social History Main Topics  . Smoking status: Former Smoker -- 1.50 packs/day    Quit date: 01/27/1990  . Smokeless tobacco: Never Used  . Alcohol Use: No  . Drug Use: No  . Sexual Activity: Not on file   Other Topics Concern  . Not on file   Social History Narrative   Pt lives home with wife Dustin [name])   Pt is retired   Education college    Pt is left handed   Pt consumes 2 cups of coffee daily     PHYSICAL EXAM   Filed Vitals:   01/30/15 0730  BP: 121/68  Pulse: 72  Height: 6\' 1"  (1.854 m)  Weight: 204 lb (92.534 kg)    Not recorded  Body mass index is 26.92 kg/(m^2).  PHYSICAL EXAMNIATION:  Gen: NAD, conversant, well nourised, obese, well groomed                     Cardiovascular: Regular rate rhythm, no peripheral edema, warm, nontender. Eyes: Conjunctivae clear without exudates or hemorrhage Neck: Supple, no carotid bruise. Pulmonary: Clear to auscultation bilaterally   NEUROLOGICAL EXAM:  MENTAL STATUS: Speech:    Speech is normal; fluent and spontaneous with normal comprehension.  Cognition: Mini-Mental Status Examination is 29 out of 30     Orientation to time, place and person     Recent and remote memory: He missed one out of 3 recalls     Normal Attention span and concentration     Normal Language, naming, repeating,spontaneous speech     Fund of knowledge   CRANIAL NERVES: CN II: Visual fields are full to confrontation. Fundoscopic exam is normal with sharp discs and no vascular changes. Pupils are round equal and briskly reactive to light. CN III, IV, VI: extraocular movement are normal. No ptosis. CN V: Facial sensation is intact to pinprick in all 3 divisions bilaterally. Corneal responses are intact.  CN VII: Face is symmetric with normal eye closure and smile. CN VIII:  Hearing is normal to rubbing fingers CN IX, X: Palate elevates symmetrically. Phonation is normal. CN XI: Head turning and shoulder shrug are intact CN XII: Tongue is midline with normal movements and no atrophy.  MOTOR: There is no pronator drift of out-stretched arms. Muscle bulk and tone are normal. Muscle strength is normal, with exception of mild bilateral toe flexion extension weakness.  REFLEXES: He was diffusely areflexia, Plantar responses are flexor.  SENSORY: Length dependent decreased to light touch, pinprick, vibratory sensation at ankle level  COORDINATION: Rapid alternating movements and fine finger movements are intact. There is no dysmetria on finger-to-nose and heel-knee-shin.    GAIT/STANCE: Posture is normal. Gait is steady with normal steps, base, arm swing, and turning. He is not able to walk on tiptoe or heels   DIAGNOSTIC DATA (LABS, IMAGING, TESTING) - I reviewed patient records, labs, notes, testing and imaging myself where available.   ASSESSMENT AND PLAN  ADRAIN GIACOMO is a 74 y.o. [name]    Bilateral lower extremity neuropathic pain  He has tried Lyrica 100 mg 4 times a day, which did not help his symptoms, has made his dizziness worse  Stop gabapentin 400 mg 4 times a day-patient reported gabapentin did not help his symptoms  Continue Trileptal 150 mg twice a day  Compounding cream for bilateral lower extremity paresthesia.    Mild cognitive impairment  The mental status examination is 29 out of 30 today   his complains of memory trouble could due to stress at home his the main caregiver of his wife, depression, medication side effect  Dustin [name], M.D. Ph.D.  [facility] [address] [place], [place] [address] Ph: [phone] Fax: [phone]  CC: Referring Provider

## 2015-01-31 ENCOUNTER — Encounter: Payer: Self-pay | Admitting: *Deleted

## 2015-01-31 ENCOUNTER — Telehealth: Payer: Self-pay | Admitting: Neurology

## 2015-01-31 LAB — COMPREHENSIVE METABOLIC PANEL
ALT: 11 IU/L (ref 0–44)
AST: 19 IU/L (ref 0–40)
Albumin/Globulin Ratio: 2 (ref 1.1–2.5)
Albumin: 4.2 g/dL (ref 3.5–4.8)
Alkaline Phosphatase: 67 IU/L (ref 39–117)
BUN/Creatinine Ratio: 20 (ref 10–22)
BUN: 18 mg/dL (ref 8–27)
Bilirubin Total: 0.4 mg/dL (ref 0.0–1.2)
CO2: 26 mmol/L (ref 18–29)
Calcium: 9.3 mg/dL (ref 8.6–10.2)
Chloride: 89 mmol/L — ABNORMAL LOW (ref 96–106)
Creatinine, Ser: 0.9 mg/dL (ref 0.76–1.27)
GFR calc Af Amer: 98 mL/min/{1.73_m2} (ref >59)
GFR calc non Af Amer: 84 mL/min/{1.73_m2} (ref >59)
Globulin, Total: 2.1 g/dL (ref 1.5–4.5)
Glucose: 97 mg/dL (ref 65–99)
Potassium: 4.5 mmol/L (ref 3.5–5.2)
Sodium: 128 mmol/L — ABNORMAL LOW (ref 134–144)
Total Protein: 6.3 g/dL (ref 6.0–8.5)

## 2015-01-31 LAB — C-REACTIVE PROTEIN: CRP: 8.9 mg/L — ABNORMAL HIGH (ref 0.0–4.9)

## 2015-01-31 LAB — VITAMIN B12: Vitamin B-12: 229 pg/mL (ref 211–946)

## 2015-01-31 LAB — THYROID PANEL WITH TSH
Free Thyroxine Index: 1.5 (ref 1.2–4.9)
T3 Uptake Ratio: 29 % (ref 24–39)
T4, Total: 5.2 ug/dL (ref 4.5–12.0)
TSH: 2.08 u[IU]/mL (ref 0.450–4.500)

## 2015-01-31 LAB — CBC
Hematocrit: 31 % — ABNORMAL LOW (ref 37.5–51.0)
Hemoglobin: 10.8 g/dL — ABNORMAL LOW (ref 12.6–17.7)
MCH: 29.9 pg (ref 26.6–33.0)
MCHC: 34.8 g/dL (ref 31.5–35.7)
MCV: 86 fL (ref 79–97)
Platelets: 178 10*3/uL (ref 150–379)
RBC: 3.61 x10E6/uL — ABNORMAL LOW (ref 4.14–5.80)
RDW: 12.6 % (ref 12.3–15.4)
WBC: 7.9 10*3/uL (ref 3.4–10.8)

## 2015-01-31 LAB — FOLATE: Folate: 5.2 ng/mL (ref >3.0)

## 2015-01-31 LAB — ANA W/REFLEX IF POSITIVE: Anti Nuclear Antibody(ANA): NEGATIVE

## 2015-01-31 LAB — RPR: RPR Ser Ql: NONREACTIVE

## 2015-01-31 LAB — CK: Total CK: 143 U/L (ref 24–204)

## 2015-01-31 MED ORDER — LIDOCAINE 5 % EX OINT: 1.0000 "application " | TOPICAL_OINTMENT | Freq: Four times a day (QID) | CUTANEOUS | Status: DC | PRN

## 2015-01-31 NOTE — Telephone Encounter (Signed)
Patient called back in regard to change in medication.  Please call.  Thanks!

## 2015-01-31 NOTE — Telephone Encounter (Signed)
Please call patient, low vitamin B12 227, he needs over-the-counter B12 supplement, 1000 g daily Mild elevated C reactive protein of unknown clinical significance, Rest of the laboratory evaluations, was normal

## 2015-01-31 NOTE — Telephone Encounter (Signed)
Pt called and says the new medication that was prescribed to him he cannot afford. He does not remember the name of the medication but says it is a cream. He wants to know if there is something different that he can get. Please call and advise [phone].

## 2015-01-31 NOTE — Addendum Note (Signed)
Addended by: Desmond Lope on: 01/31/2015 05:33 PM   Modules accepted: Orders

## 2015-02-01 ENCOUNTER — Encounter: Payer: Self-pay | Admitting: *Deleted

## 2015-02-01 NOTE — Telephone Encounter (Signed)
Spoke to patient on the evening of 01/31/15. He is aware of lab results and will start an oral B12 supplement.  Ok per Dr. Krista Blue, to provided a prescription for lidocaine ointment to replace the unaffordable compound cream.  Patient agreeable and rx sent to the pharmacy.  States he was unable to tolerate pain without gabapentin and has restarted his previous dose of 400mg , one tab in am, 1 tab in afternoon and 2 tabs at bedtime.

## 2015-02-23 DIAGNOSIS — C44719 Basal cell carcinoma of skin of left lower limb, including hip: Secondary | ICD-10-CM | POA: Diagnosis not present

## 2015-02-23 DIAGNOSIS — C44612 Basal cell carcinoma of skin of right upper limb, including shoulder: Secondary | ICD-10-CM | POA: Diagnosis not present

## 2015-03-02 DIAGNOSIS — R251 Tremor, unspecified: Secondary | ICD-10-CM | POA: Diagnosis not present

## 2015-03-02 DIAGNOSIS — E785 Hyperlipidemia, unspecified: Secondary | ICD-10-CM | POA: Diagnosis not present

## 2015-03-02 DIAGNOSIS — M5090 Cervical disc disorder, unspecified, unspecified cervical region: Secondary | ICD-10-CM | POA: Diagnosis not present

## 2015-03-02 DIAGNOSIS — D649 Anemia, unspecified: Secondary | ICD-10-CM | POA: Diagnosis not present

## 2015-03-02 DIAGNOSIS — Z79899 Other long term (current) drug therapy: Secondary | ICD-10-CM | POA: Diagnosis not present

## 2015-03-02 DIAGNOSIS — G629 Polyneuropathy, unspecified: Secondary | ICD-10-CM | POA: Diagnosis not present

## 2015-03-02 DIAGNOSIS — Z136 Encounter for screening for cardiovascular disorders: Secondary | ICD-10-CM | POA: Diagnosis not present

## 2015-03-02 DIAGNOSIS — F331 Major depressive disorder, recurrent, moderate: Secondary | ICD-10-CM | POA: Diagnosis not present

## 2015-03-02 DIAGNOSIS — Z0001 Encounter for general adult medical examination with abnormal findings: Secondary | ICD-10-CM | POA: Diagnosis not present

## 2015-03-07 DIAGNOSIS — E871 Hypo-osmolality and hyponatremia: Secondary | ICD-10-CM | POA: Diagnosis not present

## 2015-03-09 DIAGNOSIS — F321 Major depressive disorder, single episode, moderate: Secondary | ICD-10-CM | POA: Diagnosis not present

## 2015-03-23 DIAGNOSIS — M25551 Pain in right hip: Secondary | ICD-10-CM | POA: Diagnosis not present

## 2015-03-23 DIAGNOSIS — M5136 Other intervertebral disc degeneration, lumbar region: Secondary | ICD-10-CM | POA: Diagnosis not present

## 2015-03-27 DIAGNOSIS — F321 Major depressive disorder, single episode, moderate: Secondary | ICD-10-CM | POA: Diagnosis not present

## 2015-03-28 ENCOUNTER — Encounter: Payer: Self-pay | Admitting: *Deleted

## 2015-03-28 ENCOUNTER — Ambulatory Visit: Payer: Medicare Other | Admitting: Neurology

## 2015-03-28 ENCOUNTER — Ambulatory Visit (INDEPENDENT_AMBULATORY_CARE_PROVIDER_SITE_OTHER): Payer: Medicare Other | Admitting: Neurology

## 2015-03-28 ENCOUNTER — Encounter: Payer: Self-pay | Admitting: Neurology

## 2015-03-28 VITALS — BP 110/59 | HR 92 | Ht 73.0 in | Wt 196.0 lb

## 2015-03-28 DIAGNOSIS — G608 Other hereditary and idiopathic neuropathies: Secondary | ICD-10-CM | POA: Diagnosis not present

## 2015-03-28 DIAGNOSIS — R413 Other amnesia: Secondary | ICD-10-CM

## 2015-03-28 DIAGNOSIS — F329 Major depressive disorder, single episode, unspecified: Secondary | ICD-10-CM | POA: Diagnosis not present

## 2015-03-28 DIAGNOSIS — G6289 Other specified polyneuropathies: Secondary | ICD-10-CM

## 2015-03-28 DIAGNOSIS — F32A Depression, unspecified: Secondary | ICD-10-CM

## 2015-03-28 MED ORDER — NORTRIPTYLINE HCL 25 MG PO CAPS: ORAL_CAPSULE | ORAL | Status: DC

## 2015-03-28 MED ORDER — METANX 3-35-2 MG PO TABS: 1.0000 | ORAL_TABLET | Freq: Every day | ORAL | Status: DC

## 2015-03-28 MED ORDER — OXCARBAZEPINE 150 MG PO TABS: 150.0000 mg | ORAL_TABLET | Freq: Two times a day (BID) | ORAL | Status: DC

## 2015-03-28 NOTE — Progress Notes (Signed)
Chief Complaint  Patient presents with  . Peripheral Neuropathy    The painful, burning sensation on the bottom of his feet is still bothersome.  He has failed gabapentin, Lyrica and compound creams.  He is currently taking Trileptal but does not feel it is helpful for his symptoms.  . Memory Loss    Reports he is getting counseling and medication for his depression.  Feels his memory is no longer an issue.      PATIENT: Dustin [name] DOB: 1940-07-19  Chief Complaint  Patient presents with  . Peripheral Neuropathy    The painful, burning sensation on the bottom of his feet is still bothersome.  He has failed gabapentin, Lyrica and compound creams.  He is currently taking Trileptal but does not feel it is helpful for his symptoms.  . Memory Loss    Reports he is getting counseling and medication for his depression.  Feels his memory is no longer an issue.     HISTORICAL  Dustin [name] 75 yo handed male, His primary care physician is Dr. Lujean Amel at [place]  Mr. 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  Extensive laboratory evaluation failed to demonstrate etiology  He has been taking gabapentin 400 mg 4 times a day, which has been very effective in controlling his symptoms until June 2016, he noticed recurrent symptoms, bilateral plantar feet numbness tingling needles sensation, especially at the end of the day, evening time from 6 PM to 10 PM before he goes to bed, getting worse after bearing weight,  He had history of depression, taking seroquel 400 mg every night no trouble sleeping, also had a history of cervical decompression surgery in the past for neck pain, which has been successful.  He has history of chronic low back pain, no radiating pain, has been receiving epidural injection every few  months, which was helpful, he denies gait difficulty, he denies bowel and bladder incontinence.  UPDATE Dec 26 2014: He continue complains unbearable bilateral feet pain, especially by evening time, he has been taking gabapentin 400 mg 1/1/2 tablets, and Trileptal 150 mg twice a day without significant improvement  He denies low back pain, no gait difficulty, no bowel bladder incontinence, today's examination find mild bilateral toe extension to flexion weakness, areflexia, he decided to try different medication, hold of suggested evaluation such as EMG nerve conduction study MRI of lumbar at this point  UPDATE Jan 30 2015: He still complains of bilateral feet paresthesia, he took 400 mg of seroquel, help him sleep. He continues to complains of bilateral feet paresthesia. Mostly at evening time, after bearing weight   He also complains of memeoyr trouble. Today's Mini-Mental Status Examination is 29 out of 30  I also reviewed his record from [facility], he has the diagnosis of major depressive disorder, moderate.  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  He has no family history of memory loss  I personally reviewed brain in March 2014, mild atrophy. Small vessel disease  UPDATE Feb 7th 2017: He has been seen by psychologist for depression, which is under suboptimal control, he is now taking zoloft 100mg  daily, he sleeps well, he is the [facility] giver of his wife, she has insulin dependent DM,  He still complains of bilateral feet paresthesia, gets worse in the afternoon, he  is taking trileptal 150mg  bid, not much benefit, no significant side effect noticed either.  REVIEW OF SYSTEMS: Full 14 system review of systems performed and notable only for depression, neck pain, muscle cramps  ALLERGIES: No Known Allergies  HOME MEDICATIONS: Current Outpatient Prescriptions  Medication Sig Dispense Refill  . alfuzosin (UROXATRAL) 10 MG 24 hr tablet 10  mg daily.    Marland Kitchen aspirin 81 MG tablet Take 81 mg by mouth daily.      . cholecalciferol (VITAMIN D) 1000 UNITS tablet Take 2,000 Units by mouth daily. Takes 2000 iu     . desmopressin (DDAVP) 0.2 MG tablet Take by mouth at bedtime.  1  . finasteride (PROSCAR) 5 MG tablet Take 5 mg by mouth daily.      . OXcarbazepine (TRILEPTAL) 150 MG tablet Take 1 tablet (150 mg total) by mouth 2 (two) times daily. 60 tablet 6  . oxyCODONE-acetaminophen (PERCOCET) 10-325 MG per tablet 10-325 tablets as needed.    . OXYCONTIN 20 MG T12A 12 hr tablet TAKE 1 TABLET BY ORAL ROUTE EVERY 12 HOURS (DNF 09/24/14)  0  . pantoprazole (PROTONIX) 40 MG tablet Take 40 mg by mouth daily.      . vitamin B-12 (CYANOCOBALAMIN) 1000 MCG tablet Take 1,000 mcg by mouth daily.    . propranolol (INDERAL) 20 MG tablet Take 20 mg by mouth 2 (two) times daily.  5  . sertraline (ZOLOFT) 100 MG tablet TAKE 1 & 1/2 TABLETS BY MOUTH EACH MORNING  0   No current facility-administered medications for this visit.    PAST MEDICAL HISTORY: Past Medical History  Diagnosis Date  . GERD (gastroesophageal reflux disease)     protonix for control  . Insomnia   . BPH (benign prostatic hyperplasia)   . Neuropathy ([place])   . Neck pain     PAST SURGICAL HISTORY: Past Surgical History  Procedure Laterality Date  . Shoulder arthroscopy  2010    rt shoulder  . Shoulder arthroscopy  02/15/2011    Procedure: ARTHROSCOPY SHOULDER;  Surgeon: Cynda Familia;  Location: [place];  Service: Orthopedics;  Laterality: Right;  Debridement  . Tonsillectomy    . Anterior cervical decompression/discectomy fusion 4 levels N/A 10/27/2012    Procedure: Cervical Three-Four Cervical Four-Five Cervical Five-Six Cervical Six-Seven  Anterior cervical decompression/diskectomy/fusion;  Surgeon: Erline Levine, MD;  Location: [place] NEURO ORS;  Service: Neurosurgery;  Laterality: N/A;  Cervical Three-Four Cervical Four-Five Cervical Five-Six Cervical  Six-Seven  Anterior cervical decompression/diskectomy/fusion    FAMILY HISTORY: No family history on file.  SOCIAL HISTORY:  Social History   Social History  . Marital Status: Married    Spouse Name: [name]  . Number of Children: 2  . Years of Education: college   Occupational History  . retired    Social History Main Topics  . Smoking status: Former Smoker -- 1.50 packs/day    Quit date: 01/27/1990  . Smokeless tobacco: Never Used  . Alcohol Use: No  . Drug Use: No  . Sexual Activity: Not on file   Other Topics Concern  . Not on file   Social History Narrative   [name]   Pt is retired   Education college    Pt is left handed   Pt consumes 2 cups of coffee daily     PHYSICAL EXAM   Filed Vitals:   03/28/15 0843  BP: 110/59  Pulse: 92  Height: 6\' 1"  (1.854 m)  Weight: 196 lb (88.905 kg)    Not recorded      Body mass index is 25.86 kg/(m^2).  PHYSICAL EXAMNIATION:  Gen: NAD, conversant, well nourised, obese, well groomed                     Cardiovascular: Regular rate rhythm, no peripheral edema, warm, nontender. Eyes: Conjunctivae clear without exudates or hemorrhage Neck: Supple, no carotid bruise. Pulmonary: Clear to auscultation bilaterally   NEUROLOGICAL EXAM:  MENTAL STATUS: Speech:    Speech is normal; fluent and spontaneous with normal comprehension.  Cognition: Mini-Mental Status Examination is 29 out of 30     Orientation to time, place and person     Recent and remote memory: He missed one out of 3 recalls     Normal Attention span and concentration     Normal Language, naming, repeating,spontaneous speech     Fund of knowledge   CRANIAL NERVES: CN II: Visual fields are full to confrontation. Fundoscopic exam is normal with sharp discs and no vascular changes. Pupils are round equal and briskly reactive to light. CN III, IV, VI: extraocular movement are normal. No ptosis. CN V: Facial sensation is intact  to pinprick in all 3 divisions bilaterally. Corneal responses are intact.  CN VII: Face is symmetric with normal eye closure and smile. CN VIII: Hearing is normal to rubbing fingers CN IX, X: Palate elevates symmetrically. Phonation is normal. CN XI: Head turning and shoulder shrug are intact CN XII: Tongue is midline with normal movements and no atrophy.  MOTOR: There is no pronator drift of out-stretched arms. Muscle bulk and tone are normal. Muscle strength is normal, with exception of mild bilateral toe flexion extension weakness.  REFLEXES: He was diffusely areflexia, Plantar responses are flexor.  SENSORY: Length dependent decreased to light touch, pinprick, vibratory sensation at ankle level  COORDINATION: Rapid alternating movements and fine finger movements are intact. There is no dysmetria on finger-to-nose and heel-knee-shin.    GAIT/STANCE: Posture is normal. Gait is steady with normal steps, base, arm swing, and turning. He is not able to walk on tiptoe or heels   DIAGNOSTIC DATA (LABS, IMAGING, TESTING) - I reviewed patient records, labs, notes, testing and imaging myself where available.   ASSESSMENT AND PLAN  TAURENCE GOOS is a 75 y.o. male    Bilateral lower extremity neuropathic pain  He has tried Lyrica 100 mg 4 times a day, which did not help his symptoms, has made his dizziness worse  Stop gabapentin 400 mg 4 times a day-patient reported gabapentin did not help his symptoms  Continue Trileptal 150 mg twice a day, add on nortriptyline 25 mg titrating to 50 mg every night  Metanx prescription was provided     Mild cognitive impairment  The mental status examination is 29 out of 30 today   his complains of memory trouble could due to stress at home his the main caregiver of his wife, depression, medication side effect  Marcial Pacas, M.D. Ph.D.  [facility] [address] [place], [place] [address] Ph: [phone] Fax:  [phone]  CC: Referring Provider

## 2015-04-04 DIAGNOSIS — J111 Influenza due to unidentified influenza virus with other respiratory manifestations: Secondary | ICD-10-CM | POA: Diagnosis not present

## 2015-04-12 DIAGNOSIS — I951 Orthostatic hypotension: Secondary | ICD-10-CM | POA: Diagnosis not present

## 2015-04-20 DIAGNOSIS — M5417 Radiculopathy, lumbosacral region: Secondary | ICD-10-CM | POA: Diagnosis not present

## 2015-04-20 DIAGNOSIS — M47816 Spondylosis without myelopathy or radiculopathy, lumbar region: Secondary | ICD-10-CM | POA: Diagnosis not present

## 2015-04-20 DIAGNOSIS — M47817 Spondylosis without myelopathy or radiculopathy, lumbosacral region: Secondary | ICD-10-CM | POA: Diagnosis not present

## 2015-05-01 ENCOUNTER — Ambulatory Visit: Payer: Medicare Other | Admitting: Neurology

## 2015-05-02 ENCOUNTER — Encounter (HOSPITAL_COMMUNITY): Payer: Self-pay

## 2015-05-02 ENCOUNTER — Emergency Department (HOSPITAL_COMMUNITY): Payer: Medicare Other

## 2015-05-02 ENCOUNTER — Observation Stay (HOSPITAL_COMMUNITY)
Admission: EM | Admit: 2015-05-02 | Discharge: 2015-05-03 | Disposition: A | Discharge status: Home / Self Care | Payer: Medicare Other | Attending: Internal Medicine | Admitting: Internal Medicine

## 2015-05-02 DIAGNOSIS — G608 Other hereditary and idiopathic neuropathies: Secondary | ICD-10-CM

## 2015-05-02 DIAGNOSIS — Y9389 Activity, other specified: Secondary | ICD-10-CM | POA: Insufficient documentation

## 2015-05-02 DIAGNOSIS — S82402A Unspecified fracture of shaft of left fibula, initial encounter for closed fracture: Secondary | ICD-10-CM | POA: Diagnosis present

## 2015-05-02 DIAGNOSIS — G629 Polyneuropathy, unspecified: Secondary | ICD-10-CM | POA: Insufficient documentation

## 2015-05-02 DIAGNOSIS — S82832A Other fracture of upper and lower end of left fibula, initial encounter for closed fracture: Secondary | ICD-10-CM | POA: Diagnosis not present

## 2015-05-02 DIAGNOSIS — Z79899 Other long term (current) drug therapy: Secondary | ICD-10-CM | POA: Diagnosis not present

## 2015-05-02 DIAGNOSIS — R55 Syncope and collapse: Secondary | ICD-10-CM | POA: Diagnosis not present

## 2015-05-02 DIAGNOSIS — K219 Gastro-esophageal reflux disease without esophagitis: Secondary | ICD-10-CM | POA: Diagnosis not present

## 2015-05-02 DIAGNOSIS — Y998 Other external cause status: Secondary | ICD-10-CM | POA: Insufficient documentation

## 2015-05-02 DIAGNOSIS — Y9289 Other specified places as the place of occurrence of the external cause: Secondary | ICD-10-CM | POA: Insufficient documentation

## 2015-05-02 DIAGNOSIS — N400 Enlarged prostate without lower urinary tract symptoms: Secondary | ICD-10-CM

## 2015-05-02 DIAGNOSIS — Z7982 Long term (current) use of aspirin: Secondary | ICD-10-CM | POA: Diagnosis not present

## 2015-05-02 DIAGNOSIS — Z87891 Personal history of nicotine dependence: Secondary | ICD-10-CM | POA: Insufficient documentation

## 2015-05-02 DIAGNOSIS — G6289 Other specified polyneuropathies: Secondary | ICD-10-CM | POA: Diagnosis present

## 2015-05-02 DIAGNOSIS — G4700 Insomnia, unspecified: Secondary | ICD-10-CM | POA: Diagnosis not present

## 2015-05-02 DIAGNOSIS — M25571 Pain in right ankle and joints of right foot: Secondary | ICD-10-CM | POA: Diagnosis not present

## 2015-05-02 DIAGNOSIS — F329 Major depressive disorder, single episode, unspecified: Secondary | ICD-10-CM | POA: Diagnosis not present

## 2015-05-02 DIAGNOSIS — S80812A Abrasion, left lower leg, initial encounter: Secondary | ICD-10-CM | POA: Diagnosis not present

## 2015-05-02 DIAGNOSIS — W1839XA Other fall on same level, initial encounter: Secondary | ICD-10-CM | POA: Diagnosis not present

## 2015-05-02 DIAGNOSIS — F32A Depression, unspecified: Secondary | ICD-10-CM | POA: Diagnosis present

## 2015-05-02 DIAGNOSIS — S82492A Other fracture of shaft of left fibula, initial encounter for closed fracture: Secondary | ICD-10-CM

## 2015-05-02 LAB — BASIC METABOLIC PANEL
Anion gap: 13 (ref 5–15)
BUN: 18 mg/dL (ref 6–20)
CO2: 26 mmol/L (ref 22–32)
Calcium: 9.8 mg/dL (ref 8.9–10.3)
Chloride: 98 mmol/L — ABNORMAL LOW (ref 101–111)
Creatinine, Ser: 1.21 mg/dL (ref 0.61–1.24)
GFR calc Af Amer: >60 mL/min (ref >60)
GFR calc non Af Amer: 58 mL/min — ABNORMAL LOW (ref >60)
Glucose, Bld: 127 mg/dL — ABNORMAL HIGH (ref 65–99)
Potassium: 4.4 mmol/L (ref 3.5–5.1)
Sodium: 137 mmol/L (ref 135–145)

## 2015-05-02 LAB — CBC
HCT: 35.9 % — ABNORMAL LOW (ref 39.0–52.0)
Hemoglobin: 11.8 g/dL — ABNORMAL LOW (ref 13.0–17.0)
MCH: 29.8 pg (ref 26.0–34.0)
MCHC: 32.9 g/dL (ref 30.0–36.0)
MCV: 90.7 fL (ref 78.0–100.0)
Platelets: 149 10*3/uL — ABNORMAL LOW (ref 150–400)
RBC: 3.96 MIL/uL — ABNORMAL LOW (ref 4.22–5.81)
RDW: 13.6 % (ref 11.5–15.5)
WBC: 9 10*3/uL (ref 4.0–10.5)

## 2015-05-02 LAB — MAGNESIUM: Magnesium: 2 mg/dL (ref 1.7–2.4)

## 2015-05-02 LAB — URINALYSIS, ROUTINE W REFLEX MICROSCOPIC
Bilirubin Urine: NEGATIVE
Glucose, UA: NEGATIVE mg/dL
Hgb urine dipstick: NEGATIVE
Ketones, ur: NEGATIVE mg/dL
Leukocytes, UA: NEGATIVE
Nitrite: NEGATIVE
Protein, ur: NEGATIVE mg/dL
Specific Gravity, Urine: 1.014 (ref 1.005–1.030)
pH: 7.5 (ref 5.0–8.0)

## 2015-05-02 MED ORDER — ONDANSETRON HCL 4 MG/2ML IJ SOLN: 4.0000 mg | Freq: Four times a day (QID) | INTRAMUSCULAR | Status: DC | PRN

## 2015-05-02 MED ORDER — ACETAMINOPHEN 650 MG RE SUPP: 650.0000 mg | Freq: Four times a day (QID) | RECTAL | Status: DC | PRN

## 2015-05-02 MED ORDER — ACETAMINOPHEN 325 MG PO TABS: 650.0000 mg | ORAL_TABLET | Freq: Four times a day (QID) | ORAL | Status: DC | PRN

## 2015-05-02 MED ORDER — L-METHYLFOLATE-B6-B12 3-35-2 MG PO TABS
1.0000 | ORAL_TABLET | Freq: Every day | ORAL | Status: DC
  Administered 2015-05-03: 1 via ORAL
  Filled 2015-05-02: qty 1

## 2015-05-02 MED ORDER — VORTIOXETINE HBR 10 MG PO TABS
10.0000 mg | ORAL_TABLET | Freq: Every day | ORAL | Status: DC
  Administered 2015-05-03: 10 mg via ORAL
  Filled 2015-05-02: qty 1

## 2015-05-02 MED ORDER — OXYCODONE-ACETAMINOPHEN 5-325 MG PO TABS
1.0000 | ORAL_TABLET | Freq: Once | ORAL | Status: AC
  Administered 2015-05-02: 1 via ORAL
  Filled 2015-05-02: qty 1

## 2015-05-02 MED ORDER — PANTOPRAZOLE SODIUM 40 MG PO TBEC
40.0000 mg | DELAYED_RELEASE_TABLET | Freq: Every day | ORAL | Status: DC
  Administered 2015-05-03: 40 mg via ORAL
  Filled 2015-05-02: qty 1

## 2015-05-02 MED ORDER — VITAMIN D 1000 UNITS PO TABS
2000.0000 [IU] | ORAL_TABLET | Freq: Every day | ORAL | Status: DC
  Administered 2015-05-03: 2000 [IU] via ORAL
  Filled 2015-05-02: qty 2

## 2015-05-02 MED ORDER — FINASTERIDE 5 MG PO TABS
5.0000 mg | ORAL_TABLET | Freq: Every day | ORAL | Status: DC
  Administered 2015-05-03: 5 mg via ORAL
  Filled 2015-05-02: qty 1

## 2015-05-02 MED ORDER — SENNA 8.6 MG PO TABS
1.0000 | ORAL_TABLET | Freq: Two times a day (BID) | ORAL | Status: DC
  Filled 2015-05-02: qty 1

## 2015-05-02 MED ORDER — ENOXAPARIN SODIUM 40 MG/0.4ML ~~LOC~~ SOLN
40.0000 mg | SUBCUTANEOUS | Status: DC
  Administered 2015-05-02: 40 mg via SUBCUTANEOUS
  Filled 2015-05-02: qty 0.4

## 2015-05-02 MED ORDER — SODIUM CHLORIDE 0.9 % IV SOLN
INTRAVENOUS | Status: DC
  Administered 2015-05-02: 22:00:00 via INTRAVENOUS

## 2015-05-02 MED ORDER — FLEET ENEMA 7-19 GM/118ML RE ENEM: 1.0000 | ENEMA | Freq: Once | RECTAL | Status: DC | PRN

## 2015-05-02 MED ORDER — OXYCODONE HCL ER 10 MG PO T12A
20.0000 mg | EXTENDED_RELEASE_TABLET | Freq: Two times a day (BID) | ORAL | Status: DC
  Administered 2015-05-02 – 2015-05-03 (×2): 20 mg via ORAL
  Filled 2015-05-02 (×2): qty 2

## 2015-05-02 MED ORDER — VITAMIN B-12 1000 MCG PO TABS
1000.0000 ug | ORAL_TABLET | Freq: Every day | ORAL | Status: DC
  Administered 2015-05-03: 1000 ug via ORAL
  Filled 2015-05-02: qty 1

## 2015-05-02 MED ORDER — OXYCODONE HCL 5 MG PO TABS
5.0000 mg | ORAL_TABLET | Freq: Four times a day (QID) | ORAL | Status: DC | PRN
  Administered 2015-05-02: 5 mg via ORAL
  Filled 2015-05-02: qty 1

## 2015-05-02 MED ORDER — SODIUM CHLORIDE 0.9 % IV SOLN
INTRAVENOUS | Status: DC
  Administered 2015-05-02: 19:00:00 via INTRAVENOUS

## 2015-05-02 MED ORDER — OXYCODONE-ACETAMINOPHEN 10-325 MG PO TABS: 1.0000 | ORAL_TABLET | Freq: Four times a day (QID) | ORAL | Status: DC | PRN

## 2015-05-02 MED ORDER — SORBITOL 70 % SOLN: 30.0000 mL | Freq: Every day | Status: DC | PRN

## 2015-05-02 MED ORDER — OXYCODONE-ACETAMINOPHEN 5-325 MG PO TABS: 1.0000 | ORAL_TABLET | Freq: Four times a day (QID) | ORAL | Status: DC | PRN

## 2015-05-02 MED ORDER — ONDANSETRON HCL 4 MG PO TABS: 4.0000 mg | ORAL_TABLET | Freq: Four times a day (QID) | ORAL | Status: DC | PRN

## 2015-05-02 MED ORDER — QUETIAPINE FUMARATE 300 MG PO TABS
600.0000 mg | ORAL_TABLET | Freq: Every day | ORAL | Status: DC
  Administered 2015-05-02: 600 mg via ORAL
  Filled 2015-05-02 (×2): qty 2

## 2015-05-02 MED ORDER — SERTRALINE HCL 50 MG PO TABS
150.0000 mg | ORAL_TABLET | Freq: Every day | ORAL | Status: DC
  Administered 2015-05-03: 150 mg via ORAL
  Filled 2015-05-02: qty 1

## 2015-05-02 MED ORDER — ALUM & MAG HYDROXIDE-SIMETH 200-200-20 MG/5ML PO SUSP: 30.0000 mL | Freq: Four times a day (QID) | ORAL | Status: DC | PRN

## 2015-05-02 MED ORDER — POLYETHYLENE GLYCOL 3350 17 G PO PACK: 17.0000 g | PACK | Freq: Every day | ORAL | Status: DC | PRN

## 2015-05-02 MED ORDER — SODIUM CHLORIDE 0.9% FLUSH: 3.0000 mL | Freq: Two times a day (BID) | INTRAVENOUS | Status: DC

## 2015-05-02 NOTE — ED Notes (Signed)
Ortho tech paged  

## 2015-05-02 NOTE — ED Notes (Signed)
Pt insisted to ambulate to bathroom, walker provided.

## 2015-05-02 NOTE — Care Management Note (Addendum)
Case Management Note  Patient Details  Name: Dustin [name] MRN: [id number] Date of Birth: April 06, 1940  Subjective/Objective:              Patient presented to [facility] ED with syncope and bilateral ankle pain s/p fall at home.   Action/Plan:  CM received consult concerning recommendations for [facility] services,  CM met with patient, and spoke with [name] [phone] to discuss recommendations. Patient lives at home with wife who he cares for. According to patient his wife is receiving [facility] services with [facility] as well. Patient has adult children that can assist with providing supportive care.  Patient and wife both agree with recommendations for [facility] services,  offered choice [facility] selected. EDP consulted Hospitalist regarding ankle pain possible observation stay for pain management. CM will continue to follow up for disposition plan.  Expected Discharge Date:                  Expected Discharge Plan:  [place]  In-House Referral:     Discharge planning Services  CM Consult  Post Acute Care Choice:    Choice offered to:  Patient, Adult Children  DME Arranged:    DME Agency:  [place]:    [place] Agency:  [place]  Status of Service:  In process, will continue to follow  Medicare Important Message Given:    Date Medicare IM Given:    Medicare IM give by:    Date Additional Medicare IM Given:    Additional Medicare Important Message give by:     If discussed at [place] of Stay Meetings, dates discussed:    Additional CommentsLaurena Slimmer, RN 05/02/2015, 8:02 PM

## 2015-05-02 NOTE — Progress Notes (Signed)
Orthopedic Tech Progress Note Patient Details:  Dustin [name] 12-Mar-1940 [id number]  Ortho Devices Type of Ortho Device: Ace wrap, Crutches, Post (short leg) splint, Stirrup splint Ortho Device/Splint Location: LLE Ortho Device/Splint Interventions: Ordered, Application   Braulio Bosch 05/02/2015, 4:52 PM

## 2015-05-02 NOTE — ED Provider Notes (Signed)
CSN: [id number]     Arrival date & time 05/02/15  [id number] History   First MD Initiated Contact with Patient 05/02/15 1513     Chief Complaint  Patient presents with  . Loss of Consciousness    Patient is a 75 y.o. male presenting with syncope. The history is provided by the patient and a relative.  Loss of Consciousness Episode history:  Single Most recent episode:  Yesterday Duration: several seconds. Timing:  Constant Progression:  Resolved Chronicity:  New Context comment:  Standing up for shower Relieved by:  Nothing Worsened by:  Nothing tried Associated symptoms: dizziness   Associated symptoms: no chest pain, no fever, no focal weakness, no headaches, no rectal bleeding, no shortness of breath, no visual change and no vomiting   Risk factors: no coronary artery disease   Patient reports syncopal episode yesterday while getting into shower He thinks he got up from bed too soon He has been seen by his PCP for dizziness previously and it was determined to be med-related and stopped alfusozin.  No other new meds No previous h/o syncope No HA/CP/SOB No head injury No previous h/o CAD/CVA No h/o CHF He reports he injured his ankles during the fall and is having difficulty ambulating   He reports all vaccinations/tetanus UTD Past Medical History  Diagnosis Date  . GERD (gastroesophageal reflux disease)     protonix for control  . Insomnia   . BPH (benign prostatic hyperplasia)   . Neuropathy ([place])   . Neck pain    Past Surgical History  Procedure Laterality Date  . Shoulder arthroscopy  2010    rt shoulder  . Shoulder arthroscopy  02/15/2011    Procedure: ARTHROSCOPY SHOULDER;  Surgeon: Cynda Familia;  Location: [place];  Service: Orthopedics;  Laterality: Right;  Debridement  . Tonsillectomy    . Anterior cervical decompression/discectomy fusion 4 levels N/A 10/27/2012    Procedure: Cervical Three-Four Cervical Four-Five Cervical Five-Six  Cervical Six-Seven  Anterior cervical decompression/diskectomy/fusion;  Surgeon: Erline Levine, MD;  Location: [place] NEURO ORS;  Service: Neurosurgery;  Laterality: N/A;  Cervical Three-Four Cervical Four-Five Cervical Five-Six Cervical Six-Seven  Anterior cervical decompression/diskectomy/fusion   No family history on file. Social History  Substance Use Topics  . Smoking status: Former Smoker -- 1.50 packs/day    Quit date: 01/27/1990  . Smokeless tobacco: Never Used  . Alcohol Use: No    Review of Systems  Constitutional: Negative for fever.  Respiratory: Negative for shortness of breath.   Cardiovascular: Positive for syncope. Negative for chest pain.  Gastrointestinal: Negative for vomiting, diarrhea and blood in stool.  Musculoskeletal: Negative for back pain and neck pain.  Neurological: Positive for dizziness and syncope. Negative for focal weakness and headaches.  All other systems reviewed and are negative.     Allergies  Review of patient's allergies indicates no known allergies.  Home Medications   Prior to Admission medications   Medication Sig Start Date End Date Taking? Authorizing Provider  alfuzosin (UROXATRAL) 10 MG 24 hr tablet 10 mg daily. 04/22/12   Historical Provider, MD  aspirin 81 MG tablet Take 81 mg by mouth daily.      Historical Provider, MD  cholecalciferol (VITAMIN D) 1000 UNITS tablet Take 2,000 Units by mouth daily. Takes 2000 iu     Historical Provider, MD  desmopressin (DDAVP) 0.2 MG tablet Take by mouth at bedtime. 11/29/14   Historical Provider, MD  finasteride (PROSCAR) 5 MG tablet Take 5 mg  by mouth daily.      Historical Provider, MD  multivitamin (METANX) 3-35-2 MG TABS tablet Take 1 tablet by mouth daily. 03/28/15   Marcial Pacas, MD  nortriptyline (PAMELOR) 25 MG capsule One po qhs xone week, then 2 tabs po qhs 03/28/15   Marcial Pacas, MD  OXcarbazepine (TRILEPTAL) 150 MG tablet Take 1 tablet (150 mg total) by mouth 2 (two) times daily. 03/28/15   Marcial Pacas, MD  oxyCODONE-acetaminophen (PERCOCET) 10-325 MG per tablet 10-325 tablets as needed. 05/07/12   Historical Provider, MD  OXYCONTIN 20 MG T12A 12 hr tablet TAKE 1 TABLET BY ORAL ROUTE EVERY 12 HOURS (DNF 09/24/14) 09/27/14   Historical Provider, MD  pantoprazole (PROTONIX) 40 MG tablet Take 40 mg by mouth daily.      Historical Provider, MD  propranolol (INDERAL) 20 MG tablet Take 20 mg by mouth 2 (two) times daily. 03/02/15   Historical Provider, MD  sertraline (ZOLOFT) 100 MG tablet TAKE 1 & 1/2 TABLETS BY MOUTH EACH MORNING 03/15/15   Historical Provider, MD  vitamin B-12 (CYANOCOBALAMIN) 1000 MCG tablet Take 1,000 mcg by mouth daily.    Historical Provider, MD   BP 140/83 mmHg  Pulse 93  Temp(Src) 98 F (36.7 C) (Oral)  Resp 12  Ht 6\' 2"  (1.88 m)  Wt 83.915 kg  BMI 23.74 kg/m2  SpO2 100% Physical Exam CONSTITUTIONAL: Well developed/well nourished HEAD: Normocephalic/atraumatic EYES: EOMI/PERRL ENMT: Mucous membranes moist NECK: supple no meningeal signs SPINE/BACK:entire spine nontender CV: S1/S2 noted, no murmurs/rubs/gallops noted LUNGS: Lungs are clear to auscultation bilaterally, no apparent distress ABDOMEN: soft, nontender, no rebound or guarding, bowel sounds noted throughout abdomen NEURO: Pt is awake/alert/appropriate, moves all extremitiesx4.  No facial droop.   EXTREMITIES: pulses normal/equal, full ROM, tenderness swelling to bilateral malleoli to both ankles.  No deformities.  Small abrasion to right proximal tibial surface.  Minimal right knee tenderness but no deformity or bruising noted to right knee.  No pain with ROM of either hip SKIN: warm, color normal PSYCH: no abnormalities of mood noted, alert and oriented to situation  ED Course  Procedures  SPLINT APPLICATION Date/Time: [id number] PM Authorized by: Sharyon Cable Consent: Verbal consent obtained. Risks and benefits: risks, benefits and alternatives were discussed Consent given by: patient Splint  applied by: orthopedic technician Location details: left ankle Splint type: stirrup/posteriro Supplies used: fiberglass Post-procedure: The splinted body part was neurovascularly unchanged following the procedure. Patient tolerance: Patient tolerated the procedure well with no immediate complications.    3:48 PM Suspect this episode of syncope yesterday was med-related. He is on pain meds as multiple psychotropic meds Imaging pending at this time 5:16 PM Pt unable to ambulate with crutches/splint.  I am also concerned that he could have another syncopal event causing him to fall at home.  He also appears to have sprain of right ankle which limits ambulation Will admit 5:46 PM D/w dr [place] Silos Will admit for syncope  Left distal fib FX is non-operative management, he can see ortho as outpatient but will likely need PT Labs Review Labs Reviewed  BASIC METABOLIC PANEL - Abnormal; Notable for the following:    Chloride 98 (*)    Glucose, Bld 127 (*)    GFR calc non Af Amer 58 (*)    All other components within normal limits  CBC - Abnormal; Notable for the following:    RBC 3.96 (*)    Hemoglobin 11.8 (*)    HCT 35.9 (*)  Platelets 149 (*)    All other components within normal limits    Imaging Review Dg Ankle Complete Left  05/02/2015  CLINICAL DATA:  Syncopal episode yesterday with a fall and left ankle injury. Pain. Initial encounter. EXAM: LEFT ANKLE COMPLETE - 3+ VIEW COMPARISON:  None. FINDINGS: The patient has a nondisplaced fracture of the distal left fibula with associated soft tissue swelling. No other acute bony or joint abnormality is identified. Small plantar calcaneal spur is noted. IMPRESSION: Nondisplaced distal fibular fracture with associated soft tissue swelling. Electronically Signed   By: Inge Rise M.D.   On: 05/02/2015 15:53   Dg Ankle Complete Right  05/02/2015  CLINICAL DATA:  Pain EXAM: RIGHT ANKLE - COMPLETE 3+ VIEW COMPARISON:  None. FINDINGS:  There is no evidence of fracture, dislocation, or joint effusion. There is no evidence of arthropathy or other focal bone abnormality. Soft tissues are unremarkable. IMPRESSION: No acute abnormality noted. Electronically Signed   By: Inez Catalina M.D.   On: 05/02/2015 15:55   I have personally reviewed and evaluated these images and lab results as part of my medical decision-making.   EKG Interpretation   Date/Time:  Tuesday May 02 2015 10:22:34 EDT Ventricular Rate:  119 PR Interval:  170 QRS Duration: 74 QT Interval:  310 QTC Calculation: 436 R Axis:   62 Text Interpretation:  Sinus tachycardia Otherwise normal ECG Confirmed by  Christy Gentles  MD, Elenore Rota ([id number]) on 05/02/2015 3:14:27 PM     Medications  oxyCODONE-acetaminophen (PERCOCET/ROXICET) 5-325 MG per tablet 1 tablet (1 tablet Oral Given 05/02/15 1611)    MDM   Final diagnoses:  Syncope, unspecified syncope type  Fracture of distal end of left fibula    Nursing notes including past medical history and social history reviewed and considered in documentation Labs/vital reviewed myself and considered during evaluation xrays/imaging reviewed by myself and considered during evaluation     Ripley Fraise, MD 05/02/15 1747

## 2015-05-02 NOTE — ED Notes (Signed)
Ortho tech at bedside 

## 2015-05-02 NOTE — ED Notes (Signed)
Patient here with bilateral ankle pain after syncopal event yesterday. Reports that his BP has been varying and thinks that is why he passed out, alert and oriented

## 2015-05-02 NOTE — H&P (Signed)
[facility] History and Physical  Dustin [name] [id number] DOB: 1940-06-19 DOA: 05/02/2015  Referring physician: Dr Christy Gentles PCP: Lujean Amel, MD   Chief Complaint: Syncope  HPI: Dustin [name] is a 75 y.o. 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 Patient stated that he has a history of orthostasis and 5 days prior to admission was seen at PCPs office at which point in time his urotraxal was discontinued as well as propranolol . Patient states 1 day prior to admission he got up to go to the bathroom and return on the shower while waiting for the water to get high and he passed out for a few seconds and felt dizzy prior to that. Patient stated that he knows he was a few seconds because when he got up the water was still cold. Patient denies any bowel or bladder incontinence. Patient denies any palpitations. Patient denies any chest pain. No headaches. No shortness of breath. Patient denies any fevers, no chills, no nausea, no vomiting, no abdominal pain, no diarrhea, no constipation, no dysuria, no melena, no hematemesis, no hematochezia, no cough. Patient was seen in the emergency room and noted to have a left distal fibular fracture. EKG done showed a sinus tachycardia. Basic metabolic profile and a chloride of 98 otherwise was within normal limits. CBC had a hemoglobin of 11.8 platelet count of 149. Glucose level is 127. Splint was placed on the patient the ED physician felt patient's fracture was nonsurgical. [facility] were called to admit the patient for further evaluation and management for his syncope.   Review of Systems: As per history of present illness otherwise negative. Constitutional:  No weight loss, night sweats, Fevers, chills, fatigue.  HEENT:  No headaches, Difficulty swallowing,Tooth/dental problems,Sore throat,    No sneezing, itching, ear ache, nasal congestion, post nasal drip,  Cardio-vascular:  No chest pain, Orthopnea, PND, swelling in lower extremities, anasarca, dizziness, palpitations  GI:  No heartburn, indigestion, abdominal pain, nausea, vomiting, diarrhea, change in bowel habits, loss of appetite  Resp:  No shortness of breath with exertion or at rest. No excess mucus, no productive cough, No non-productive cough, No coughing up of blood.No change in color of mucus.No wheezing.No chest wall deformity  Skin:  no rash or lesions.  GU:  no dysuria, change in color of urine, no urgency or frequency. No flank pain.  Musculoskeletal:  No joint pain or swelling. No decreased range of motion. No back pain.  Psych:  No change in mood or affect. No depression or anxiety. No memory loss.   Past Medical History  Diagnosis Date  . GERD (gastroesophageal reflux disease)     protonix for control  . Insomnia   . BPH (benign prostatic hyperplasia)   . Neuropathy ([place])   . Neck pain    Past Surgical History  Procedure Laterality Date  . Shoulder arthroscopy  2010    rt shoulder  . Shoulder arthroscopy  02/15/2011    Procedure: ARTHROSCOPY SHOULDER;  Surgeon: Cynda Familia;  Location: [facility];  Service: Orthopedics;  Laterality: Right;  Debridement  . Tonsillectomy    . Anterior cervical decompression/discectomy fusion 4 levels N/A 10/27/2012    Procedure: Cervical Three-Four Cervical Four-Five Cervical Five-Six Cervical Six-Seven  Anterior cervical decompression/diskectomy/fusion;  Surgeon: Erline Levine, MD;  Location: [facility];  Service: Neurosurgery;  Laterality: N/A;  Cervical  Three-Four Cervical Four-Five Cervical Five-Six Cervical Six-Seven  Anterior cervical decompression/diskectomy/fusion   Social History:  reports that he quit smoking about 25 years ago. He has never used smokeless tobacco. He reports that he does not drink alcohol or use illicit drugs.  No  Known Allergies  History reviewed. No pertinent family history. Parents deceased.  Prior to Admission medications   Medication Sig Start Date End Date Taking? Authorizing Provider  aspirin 81 MG tablet Take 81 mg by mouth daily.     Yes Historical Provider, MD  cholecalciferol (VITAMIN D) 1000 UNITS tablet Take 2,000 Units by mouth daily.    Yes Historical Provider, MD  finasteride (PROSCAR) 5 MG tablet Take 5 mg by mouth daily.     Yes Historical Provider, MD  multivitamin (METANX) 3-35-2 MG TABS tablet Take 1 tablet by mouth daily. 03/28/15  Yes Marcial Pacas, MD  nortriptyline (PAMELOR) 25 MG capsule One po qhs xone week, then 2 tabs po qhs Patient taking differently: Take 50 mg by mouth at bedtime.  03/28/15  Yes Marcial Pacas, MD  oxyCODONE-acetaminophen (PERCOCET) 10-325 MG per tablet Take 1 tablet by mouth every 6 (six) hours as needed for pain.  05/07/12  Yes Historical Provider, MD  OXYCONTIN 20 MG T12A 12 hr tablet TAKE 1 TABLET BY ORAL ROUTE EVERY 12 HOURS (DNF 09/24/14) 09/27/14  Yes Historical Provider, MD  pantoprazole (PROTONIX) 40 MG tablet Take 40 mg by mouth daily.     Yes Historical Provider, MD  QUEtiapine (SEROQUEL) 300 MG tablet Take 600 mg by mouth at bedtime.   Yes Historical Provider, MD  sertraline (ZOLOFT) 100 MG tablet TAKE 1 & 1/2 TABLETS BY MOUTH EACH MORNING 03/15/15  Yes Historical Provider, MD  vitamin B-12 (CYANOCOBALAMIN) 1000 MCG tablet Take 1,000 mcg by mouth daily.   Yes Historical Provider, MD  Vortioxetine HBr (TRINTELLIX) 10 MG TABS Take 10 mg by mouth daily.   Yes Historical Provider, MD   Physical Exam: Filed Vitals:   05/02/15 1715 05/02/15 1730 05/02/15 1745 05/02/15 1800  BP: 130/71 118/75 129/77 135/73  Pulse: 84 93 88 93  Temp:      TempSrc:      Resp: 13 22 22 14   Height:      Weight:      SpO2: 100% 100% 100% 100%    Wt Readings from Last 3 Encounters:  05/02/15 83.915 kg (185 lb)  03/28/15 88.905 kg (196 lb)  01/30/15 92.534 kg (204 lb)     General:  Well-developed well-nourished laying on gurney no acute cardio pulmonary distress. Speaking in full sentences.  Eyes: PERRLA, EOMI, normal lids, irises & conjunctiva ENT: grossly normal hearing, lips & tongue Neck: no LAD, masses or thyromegaly Cardiovascular: RRR, no m/r/g. No LE edema. Respiratory: CTA bilaterally, no w/r/r. Normal respiratory effort. Abdomen: soft, ntnd, positive bowel sounds, no rebound, no guarding  Skin: no rash or induration seen on limited exam Musculoskeletal: grossly normal tone bilateral upper extremity. Right lower extremity. Left lower extremity in splint.  Psychiatric: grossly normal mood and affect, speech fluent and appropriate Neurologic:  alert and oriented 3. Cranial nerves II through XII grossly intact. No focal deficits.           Labs on Admission:  Basic Metabolic Panel:  Recent Labs Lab 05/02/15 1027  NA 137  K 4.4  CL 98*  CO2 26  GLUCOSE 127*  BUN 18  CREATININE 1.21  CALCIUM 9.8   Liver Function Tests: No results for input(s):  AST, ALT, ALKPHOS, BILITOT, PROT, ALBUMIN in the last 168 hours. No results for input(s): LIPASE, AMYLASE in the last 168 hours. No results for input(s): AMMONIA in the last 168 hours. CBC:  Recent Labs Lab 05/02/15 1027  WBC 9.0  HGB 11.8*  HCT 35.9*  MCV 90.7  PLT 149*   Cardiac Enzymes: No results for input(s): CKTOTAL, CKMB, CKMBINDEX, TROPONINI in the last 168 hours.  BNP (last 3 results) No results for input(s): BNP in the last 8760 hours.  ProBNP (last 3 results) No results for input(s): PROBNP in the last 8760 hours.  CBG: No results for input(s): GLUCAP in the last 168 hours.  Radiological Exams on Admission: Dg Ankle Complete Left  05/02/2015  CLINICAL DATA:  Syncopal episode yesterday with a fall and left ankle injury. Pain. Initial encounter. EXAM: LEFT ANKLE COMPLETE - 3+ VIEW COMPARISON:  None. FINDINGS: The patient has a nondisplaced fracture of the distal left  fibula with associated soft tissue swelling. No other acute bony or joint abnormality is identified. Small plantar calcaneal spur is noted. IMPRESSION: Nondisplaced distal fibular fracture with associated soft tissue swelling. Electronically Signed   By: Inge Rise M.D.   On: 05/02/2015 15:53   Dg Ankle Complete Right  05/02/2015  CLINICAL DATA:  Pain EXAM: RIGHT ANKLE - COMPLETE 3+ VIEW COMPARISON:  None. FINDINGS: There is no evidence of fracture, dislocation, or joint effusion. There is no evidence of arthropathy or other focal bone abnormality. Soft tissues are unremarkable. IMPRESSION: No acute abnormality noted. Electronically Signed   By: Inez Catalina M.D.   On: 05/02/2015 15:55    EKG: Independently reviewed. Sinus tachycardia  Assessment/Plan Principal Problem:   Syncope Active Problems:   Peripheral axonal neuropathy   Depression   Closed left fibular fracture   GERD (gastroesophageal reflux disease)   BPH (benign prostatic hyperplasia)  #1 syncope Likely secondary to multiple psychotropic medications and orthostatic hypotension. Patient started approximately a month ago on nortriptyline for his neuropathy. Patient's UROTRAXALL and propranolol were discontinued per PCP 5 days prior to admission due to an episode of orthostasis per patient at PCPs office. Patient with no focal neurological deficits. EKG with no ischemic changes. Patient denies any chest pain. We'll place on telemetry. Check a 2-D echo. Unable to get orthostatic blood pressure from laying to standing due to patient's fibular fracture however orthostatics from laying to sitting were normal. Discontinue nortriptyline. Gentle hydration. Follow.  #2 closed left fibular fracture Likely nonsurgical fracture per ED physician. Patient has a splint on. PT/OT. Pain management. Will need to follow-up with orthopedics as outpatient.  #3 gastroesophageal reflux disease  PPI.  #4 depression Stable. Patient with no  suicidal or homicidal ideation. Continue Zoloft.   #5 BPH Stable. Continue Proscar.  #6 prophylaxis PPI for GI prophylaxis. Lovenox for DVT prophylaxis.   Code Status: DNR DVT Prophylaxis: Lovenox Family Communication: updated patient, son and daughter in law at bedside. Disposition Plan: Admit to telemetry  Time spent: 65 mins  [facility] MD [facility] Pager [phone]

## 2015-05-02 NOTE — Progress Notes (Signed)
Orthopedic Tech Progress Note Patient Details:  Dustin [name] [birth year] [id number] Patient unable to use crutches Ortho Devices Type of Ortho Device: Crutches Ortho Device/Splint Location: LLE Ortho Device/Splint Interventions: Ordered, Application   Braulio Bosch 05/02/2015, 4:59 PM

## 2015-05-03 ENCOUNTER — Ambulatory Visit (HOSPITAL_BASED_OUTPATIENT_CLINIC_OR_DEPARTMENT_OTHER): Payer: Medicare Other

## 2015-05-03 DIAGNOSIS — K219 Gastro-esophageal reflux disease without esophagitis: Secondary | ICD-10-CM

## 2015-05-03 DIAGNOSIS — N400 Enlarged prostate without lower urinary tract symptoms: Secondary | ICD-10-CM

## 2015-05-03 DIAGNOSIS — S82402A Unspecified fracture of shaft of left fibula, initial encounter for closed fracture: Secondary | ICD-10-CM | POA: Diagnosis not present

## 2015-05-03 DIAGNOSIS — F329 Major depressive disorder, single episode, unspecified: Secondary | ICD-10-CM

## 2015-05-03 DIAGNOSIS — S8265XA Nondisplaced fracture of lateral malleolus of left fibula, initial encounter for closed fracture: Secondary | ICD-10-CM | POA: Diagnosis not present

## 2015-05-03 DIAGNOSIS — G608 Other hereditary and idiopathic neuropathies: Secondary | ICD-10-CM

## 2015-05-03 DIAGNOSIS — R55 Syncope and collapse: Secondary | ICD-10-CM

## 2015-05-03 LAB — COMPREHENSIVE METABOLIC PANEL
ALT: 15 U/L — ABNORMAL LOW (ref 17–63)
AST: 18 U/L (ref 15–41)
Albumin: 3.6 g/dL (ref 3.5–5.0)
Alkaline Phosphatase: 57 U/L (ref 38–126)
Anion gap: 10 (ref 5–15)
BUN: 14 mg/dL (ref 6–20)
CO2: 25 mmol/L (ref 22–32)
Calcium: 9 mg/dL (ref 8.9–10.3)
Chloride: 103 mmol/L (ref 101–111)
Creatinine, Ser: 0.95 mg/dL (ref 0.61–1.24)
GFR calc Af Amer: >60 mL/min (ref >60)
GFR calc non Af Amer: >60 mL/min (ref >60)
Glucose, Bld: 105 mg/dL — ABNORMAL HIGH (ref 65–99)
Potassium: 4 mmol/L (ref 3.5–5.1)
Sodium: 138 mmol/L (ref 135–145)
Total Bilirubin: 0.9 mg/dL (ref 0.3–1.2)
Total Protein: 6.1 g/dL — ABNORMAL LOW (ref 6.5–8.1)

## 2015-05-03 LAB — ECHOCARDIOGRAM COMPLETE
Height: 74 in
Weight: 2985.6 oz

## 2015-05-03 LAB — CBC
HCT: 30.3 % — ABNORMAL LOW (ref 39.0–52.0)
Hemoglobin: 10 g/dL — ABNORMAL LOW (ref 13.0–17.0)
MCH: 29.7 pg (ref 26.0–34.0)
MCHC: 33 g/dL (ref 30.0–36.0)
MCV: 89.9 fL (ref 78.0–100.0)
Platelets: 128 10*3/uL — ABNORMAL LOW (ref 150–400)
RBC: 3.37 MIL/uL — ABNORMAL LOW (ref 4.22–5.81)
RDW: 13.8 % (ref 11.5–15.5)
WBC: 6.6 10*3/uL (ref 4.0–10.5)

## 2015-05-03 LAB — GLUCOSE, CAPILLARY: Glucose-Capillary: 117 mg/dL — ABNORMAL HIGH (ref 65–99)

## 2015-05-03 MED ORDER — POLYETHYLENE GLYCOL 3350 17 G PO PACK: 17.0000 g | PACK | Freq: Every day | ORAL | Status: DC | PRN

## 2015-05-03 MED ORDER — OXYCODONE-ACETAMINOPHEN 10-325 MG PO TABS: 1.0000 | ORAL_TABLET | Freq: Four times a day (QID) | ORAL | Status: DC | PRN

## 2015-05-03 MED ORDER — SENNA 8.6 MG PO TABS: 1.0000 | ORAL_TABLET | Freq: Two times a day (BID) | ORAL | Status: DC

## 2015-05-03 NOTE — Care Management Obs Status (Signed)
[facility] NOTIFICATION   Patient Details  Name: Dustin [name] MRN: [id number] Date of Birth: 1940/09/06   Medicare Observation Status Notification Given:  Yes (syncope)    Bethena Roys, RN 05/03/2015, 2:37 PM

## 2015-05-03 NOTE — Care Management Note (Addendum)
Case Management Note Initial Note started by Laurena Slimmer 05-02-15 Patient Details  Name: Dustin [name] MRN: [id number] Date of Birth: Aug 02, 1940  Subjective/Objective: Patient presented to [facility] ED with syncope and bilateral ankle pain s/p fall at home.   Action/Plan: CM received consult concerning recommendations for [facility] services, CM met with patient, and spoke with wife Dustin [name] [phone] to discuss recommendations. Patient lives at home with wife who he cares for. According to patient his wife is receiving [facility] services with [facility] as well. Patient has adult children that can assist with providing supportive care. Patient and wife both agree with recommendations for [facility] services, offered choice [facility] selected. EDP consulted Hospitalist regarding ankle pain possible observation stay for pain management. CM will continue to follow up for disposition plan.   Expected Discharge Date:                  Expected Discharge Plan:  [place]  In-House Referral:     Discharge planning Services  CM Consult  Post Acute Care Choice:    Choice offered to:  Patient, Adult Children  DME Arranged:    DME Agency:  [place]:  PT [place] Agency:  [place]  Status of Service:  Completed, signed off  Medicare Important Message Given:    Date Medicare IM Given:    Medicare IM give by:    Date Additional Medicare IM Given:    Additional Medicare Important Message give by:     If discussed at [place] of Stay Meetings, dates discussed:    Additional Comments: [id number] 05-03-15 Jacqlyn Krauss, RN,BSN [phone] CM did speak with pt and pt was given choice for [facility]. CM did make referral for [place]. SOC to begin within 24-48 hours of d/c. [facility] order placed for community resources. No further needs from CM at this time.   Bethena Roys, RN 05/03/2015, 2:32 PM

## 2015-05-03 NOTE — Plan of Care (Signed)
Problem: Health Behavior/Discharge Planning: Goal: Ability to manage health-related needs will improve Outcome: Completed/Met Date Met:  05/03/15 Discharged home with rolling walker and home health  Problem: Tissue Perfusion: Goal: Risk factors for ineffective tissue perfusion will decrease Outcome: Completed/Met Date Met:  05/03/15 Patient with good circulation in left foot currently.  Instructed patient that if toes became blue or purple, numb or tingling to notify physician.

## 2015-05-03 NOTE — Discharge Summary (Signed)
Physician Discharge Summary   Patient ID: Dustin [name] MRN: [id number] DOB/AGE: 1941-01-02 75 y.o.  Admit date: 05/02/2015 Discharge date: 05/03/2015  Primary Care Physician:  Dustin Amel, MD  Discharge Diagnoses:    . Syncope . Closed left fibular fracture . Depression . Peripheral axonal neuropathy . GERD (gastroesophageal reflux disease)  Consults:  Orthopedics, Dustin. Rex [name]   Recommendations for Outpatient Follow-up:  1. Please repeat CBC/BMET at next visit 2. Per orthopedics, Dustin [name] NWB LLE Keep splint clean and dry Ice, elevation PT/OT F/u with Dustin [name] 2 weeks after D/C   DIET: Heart healthy diet    Allergies:  No Known Allergies   DISCHARGE MEDICATIONS: Current Discharge Medication List    START taking these medications   Details  polyethylene glycol (MIRALAX / GLYCOLAX) packet Take 17 g by mouth daily as needed for mild constipation. Qty: 30 each, Refills: 0    senna (SENOKOT) 8.6 MG TABS tablet Take 1 tablet (8.6 mg total) by mouth 2 (two) times daily. For constipation Qty: 120 each, Refills: 0      CONTINUE these medications which have CHANGED   Details  oxyCODONE-acetaminophen (PERCOCET) 10-325 MG tablet Take 1 tablet by mouth every 6 (six) hours as needed for pain. Qty: 30 tablet, Refills: 0      CONTINUE these medications which have NOT CHANGED   Details  cholecalciferol (VITAMIN D) 1000 UNITS tablet Take 2,000 Units by mouth daily.     finasteride (PROSCAR) 5 MG tablet Take 5 mg by mouth daily.      multivitamin (METANX) 3-35-2 MG TABS tablet Take 1 tablet by mouth daily. Qty: 30 tablet, Refills: 11    OXYCONTIN 20 MG T12A 12 hr tablet TAKE 1 TABLET BY ORAL ROUTE EVERY 12 HOURS (DNF 09/24/14) Refills: 0    pantoprazole (PROTONIX) 40 MG tablet Take 40 mg by mouth daily.      QUEtiapine (SEROQUEL) 300 MG tablet Take 600 mg by mouth at bedtime.    sertraline (ZOLOFT) 100 MG tablet TAKE 1 & 1/2 TABLETS BY MOUTH EACH  MORNING Refills: 0    vitamin B-12 (CYANOCOBALAMIN) 1000 MCG tablet Take 1,000 mcg by mouth daily.    Vortioxetine HBr (TRINTELLIX) 10 MG TABS Take 10 mg by mouth daily.      STOP taking these medications     aspirin 81 MG tablet      nortriptyline (PAMELOR) 25 MG capsule          Brief H and P: For complete details please refer to admission H and P, but in briefRobert R [name] is a 75 y.o. 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 Patient stated that he has a history of orthostasis and 5 days prior to admission was seen at PCPs office at which point in time his urotraxal was discontinued as well as propranolol . Patient states 1 day prior to admission he got up to go to the bathroom and return on the shower while waiting for the water to get high and he passed out for a few seconds and felt dizzy prior to that. Patient stated that he knows he was a few seconds because when he got up the water was still cold. Patient denies any bowel or bladder incontinence. Patient denies any palpitations. Patient denies any chest pain. No headaches. No shortness of breath. Patient denies any fevers, no chills, no  nausea, no vomiting, no abdominal pain, no diarrhea, no constipation, no dysuria, no melena, no hematemesis, no hematochezia, no cough. Patient was seen in the emergency room and noted to have a left distal fibular fracture. EKG done showed a sinus tachycardia. Basic metabolic profile and a chloride of 98 otherwise was within normal limits. CBC had a hemoglobin of 11.8 platelet count of 149. Glucose level is 127. Splint was placed on the patient the ED physician felt patient's fracture was nonsurgical. [facility] hospital[facility] were called to admit the patient for further evaluation and management for his syncope.  Hospital Course:  syncope likely due to orthostatic  hypotension, vasovagal Likely secondary to multiple psychotropic medications and orthostatic hypotension. Patient started approximately a month ago on nortriptyline for his neuropathy. Patient's UROTRAXALL and propranolol were discontinued per PCP 5 days prior to admission due to an episode of orthostasis per patient at PCPs office.  No focal neurological deficits noted. EKG did not show any acute ischemic changes. Serial troponins negative Discontinued the nortriptyline Patient was placed on gentle hydration. 2-D echo showed EF 60-65%    closed left fibular fracture due to syncope and fall Ankle x-ray showed left closed distal fibular fracture. Splint was placed in ED. Orthopedics was consulted. Patient was seen by Dustin [name], recommended nonweightbearing on the left lower extremity, PTOT and follow up in 2 weeks   gastroesophageal reflux disease  Continue PPI   depression Stable. Patient with no suicidal or homicidal ideation. Continue Zoloft.    BPH Stable. Continue Proscar.   Day of Discharge BP 109/59 mmHg  Pulse 94  Temp(Src) 97.7 F (36.5 C) (Oral)  Resp 15  Ht 6\' 2"  (1.88 m)  Wt 84.641 kg (186 lb 9.6 oz)  BMI 23.95 kg/m2  SpO2 100%  Physical Exam: General: Alert and awake oriented x3 not in any acute distress. HEENT: anicteric sclera, pupils reactive to light and accommodation CVS: S1-S2 clear no murmur rubs or gallops Chest: clear to auscultation bilaterally, no wheezing rales or rhonchi Abdomen: soft nontender, nondistended, normal bowel sounds Extremities: no cyanosis, clubbing or edema noted bilaterally, left leg splint placed Neuro: Cranial nerves II-XII intact, no focal neurological deficits   The results of significant diagnostics from this hospitalization (including imaging, microbiology, ancillary and laboratory) are listed below for reference.    LAB RESULTS: Basic Metabolic Panel:  Recent Labs Lab 05/02/15 1027 05/02/15 2053 05/03/15 0420  NA  137  --  138  K 4.4  --  4.0  CL 98*  --  103  CO2 26  --  25  GLUCOSE 127*  --  105*  BUN 18  --  14  CREATININE 1.21  --  0.95  CALCIUM 9.8  --  9.0  MG  --  2.0  --    Liver Function Tests:  Recent Labs Lab 05/03/15 0420  AST 18  ALT 15*  ALKPHOS 57  BILITOT 0.9  PROT 6.1*  ALBUMIN 3.6   No results for input(s): LIPASE, AMYLASE in the last 168 hours. No results for input(s): AMMONIA in the last 168 hours. CBC:  Recent Labs Lab 05/02/15 1027 05/03/15 0420  WBC 9.0 6.6  HGB 11.8* 10.0*  HCT 35.9* 30.3*  MCV 90.7 89.9  PLT 149* 128*   Cardiac Enzymes: No results for input(s): CKTOTAL, CKMB, CKMBINDEX, TROPONINI in the last 168 hours. BNP: Invalid input(s): POCBNP CBG:  Recent Labs Lab 05/03/15 1137  GLUCAP 117*    Significant Diagnostic Studies:  Dg Ankle Complete  Left  05/02/2015  CLINICAL DATA:  Syncopal episode yesterday with a fall and left ankle injury. Pain. Initial encounter. EXAM: LEFT ANKLE COMPLETE - 3+ VIEW COMPARISON:  None. FINDINGS: The patient has a nondisplaced fracture of the distal left fibula with associated soft tissue swelling. No other acute bony or joint abnormality is identified. Small plantar calcaneal spur is noted. IMPRESSION: Nondisplaced distal fibular fracture with associated soft tissue swelling. Electronically Signed   By: Inge Rise M.D.   On: 05/02/2015 15:53   Dg Ankle Complete Right  05/02/2015  CLINICAL DATA:  Pain EXAM: RIGHT ANKLE - COMPLETE 3+ VIEW COMPARISON:  None. FINDINGS: There is no evidence of fracture, dislocation, or joint effusion. There is no evidence of arthropathy or other focal bone abnormality. Soft tissues are unremarkable. IMPRESSION: No acute abnormality noted. Electronically Signed   By: Inez Catalina M.D.   On: 05/02/2015 15:55    2D ECHO: Study Conclusions  - Left ventricle: The cavity size was normal. There was mild focal  basal hypertrophy of the septum. Systolic function was normal.   The estimated ejection fraction was in the range of 60% to 65%.  Wall motion was normal; there were no regional wall motion  abnormalities. Doppler parameters are consistent with abnormal  left ventricular relaxation (grade 1 diastolic dysfunction). - Aortic valve: Transvalvular velocity was within the normal range.  There was no stenosis. - Mitral valve: Transvalvular velocity was within the normal range.  There was no evidence for stenosis. There was trivial  regurgitation. - Right ventricle: The cavity size was normal. Wall thickness was  normal. Systolic function was normal. - Right atrium: The atrium was mildly dilated. - Tricuspid valve: There was mild regurgitation. - Pulmonary arteries: Systolic pressure was within the normal  range. - Inferior vena cava: The vessel was normal in size. The  respirophasic diameter changes were in the normal range (>= 50%),  consistent with normal central venous pressure.  Disposition and Follow-up:    DISPOSITION: home    DISCHARGE FOLLOW-UP Follow-up Information    Follow up with Dustin Amel, MD. Schedule an appointment as soon as possible for a visit in 10 days.   Specialty:  Family Medicine   Why:  for hospital follow-up   Contact information:   [address] [place] [address] [phone]       Follow up with [place], Horald Pollen, MD. Schedule an appointment as soon as possible for a visit in 2 weeks.   Specialty:  Orthopedic Surgery   Why:  for hospital follow-up   Contact information:   [address] [place] [id number]        Time spent on Discharge: 60mins   Signed:   Anahy Esh M.D. [facility] 05/03/2015, 1:56 PM Pager: [phone]

## 2015-05-03 NOTE — Consult Note (Signed)
ORTHOPAEDIC CONSULTATION  REQUESTING PHYSICIAN: Ripudeep Krystal Eaton, MD  PCP:  Lujean Amel, MD  Chief Complaint: L ankle fx  HPI: Dustin [name] is a 75 y.o. male who complains of L ankle pain. Had syncopal episode on Monday 3/13 and injured left ankle. Presented to ED yesterday and was [facility] by [facility]. Multiple syncopal episodes recently, believed to be due to BP meds. Splint placed in ED. Denies other injuries.  Past Medical History  Diagnosis Date  . GERD (gastroesophageal reflux disease)     protonix for control  . Insomnia   . BPH (benign prostatic hyperplasia)   . Neuropathy ([place])   . Neck pain    Past Surgical History  Procedure Laterality Date  . Shoulder arthroscopy  2010    rt shoulder  . Shoulder arthroscopy  02/15/2011    Procedure: ARTHROSCOPY SHOULDER;  Surgeon: Cynda Familia;  Location: [facility];  Service: Orthopedics;  Laterality: Right;  Debridement  . Tonsillectomy    . Anterior cervical decompression/discectomy fusion 4 levels N/A 10/27/2012    Procedure: Cervical Three-Four Cervical Four-Five Cervical Five-Six Cervical Six-Seven  Anterior cervical decompression/diskectomy/fusion;  Surgeon: Erline Levine, MD;  Location: [facility];  Service: Neurosurgery;  Laterality: N/A;  Cervical Three-Four Cervical Four-Five Cervical Five-Six Cervical Six-Seven  Anterior cervical decompression/diskectomy/fusion   Social History   Social History  . Marital Status: Married    Spouse Name: Dustin [name]  . Number of Children: 2  . Years of Education: college   Occupational History  . retired    Social History Main Topics  . Smoking status: Former Smoker -- 1.50 packs/day    Quit date: 01/27/1990  . Smokeless tobacco: Never Used  . Alcohol Use: No  . Drug Use: No  . Sexual Activity: Not Asked   Other Topics Concern  . None   Social History Narrative   Pt lives home with wife Dustin [name])   Pt is retired   Veterinary surgeon    Pt is  left handed   Pt consumes 2 cups of coffee daily   History reviewed. No pertinent family history. No Known Allergies Prior to Admission medications   Medication Sig Start Date End Date Taking? Authorizing Provider  aspirin 81 MG tablet Take 81 mg by mouth daily.     Yes Historical Provider, MD  cholecalciferol (VITAMIN D) 1000 UNITS tablet Take 2,000 Units by mouth daily.    Yes Historical Provider, MD  finasteride (PROSCAR) 5 MG tablet Take 5 mg by mouth daily.     Yes Historical Provider, MD  multivitamin (METANX) 3-35-2 MG TABS tablet Take 1 tablet by mouth daily. 03/28/15  Yes Marcial Pacas, MD  nortriptyline (PAMELOR) 25 MG capsule One po qhs xone week, then 2 tabs po qhs Patient taking differently: Take 50 mg by mouth at bedtime.  03/28/15  Yes Marcial Pacas, MD  OXYCONTIN 20 MG T12A 12 hr tablet TAKE 1 TABLET BY ORAL ROUTE EVERY 12 HOURS (DNF 09/24/14) 09/27/14  Yes Historical Provider, MD  pantoprazole (PROTONIX) 40 MG tablet Take 40 mg by mouth daily.     Yes Historical Provider, MD  QUEtiapine (SEROQUEL) 300 MG tablet Take 600 mg by mouth at bedtime.   Yes Historical Provider, MD  sertraline (ZOLOFT) 100 MG tablet TAKE 1 & 1/2 TABLETS BY MOUTH EACH MORNING 03/15/15  Yes Historical Provider, MD  vitamin B-12 (CYANOCOBALAMIN) 1000 MCG tablet Take 1,000 mcg by mouth daily.   Yes Historical Provider, MD  Vortioxetine HBr ([place])  10 MG TABS Take 10 mg by mouth daily.   Yes Historical Provider, MD  oxyCODONE-acetaminophen (PERCOCET) 10-325 MG tablet Take 1 tablet by mouth every 6 (six) hours as needed for pain. 05/03/15   Ripudeep Krystal Eaton, MD  polyethylene glycol (MIRALAX / GLYCOLAX) packet Take 17 g by mouth daily as needed for mild constipation. 05/03/15   Ripudeep Krystal Eaton, MD  senna (SENOKOT) 8.6 MG TABS tablet Take 1 tablet (8.6 mg total) by mouth 2 (two) times daily. For constipation 05/03/15   Ripudeep Krystal Eaton, MD   Dg Ankle Complete Left  05/02/2015  CLINICAL DATA:  Syncopal episode yesterday with a  fall and left ankle injury. Pain. Initial encounter. EXAM: LEFT ANKLE COMPLETE - 3+ VIEW COMPARISON:  None. FINDINGS: The patient has a nondisplaced fracture of the distal left fibula with associated soft tissue swelling. No other acute bony or joint abnormality is identified. Small plantar calcaneal spur is noted. IMPRESSION: Nondisplaced distal fibular fracture with associated soft tissue swelling. Electronically Signed   By: Inge Rise M.D.   On: 05/02/2015 15:53   Dg Ankle Complete Right  05/02/2015  CLINICAL DATA:  Pain EXAM: RIGHT ANKLE - COMPLETE 3+ VIEW COMPARISON:  None. FINDINGS: There is no evidence of fracture, dislocation, or joint effusion. There is no evidence of arthropathy or other focal bone abnormality. Soft tissues are unremarkable. IMPRESSION: No acute abnormality noted. Electronically Signed   By: Inez Catalina M.D.   On: 05/02/2015 15:55    Positive ROS: All other systems have been reviewed and were otherwise negative with the exception of those mentioned in the HPI and as above.  Physical Exam: General: Alert, no acute distress Cardiovascular: No pedal edema Respiratory: No cyanosis, no use of accessory musculature GI: No organomegaly, abdomen is soft and non-tender Skin: No lesions in the area of chief complaint Neurologic: Sensation intact distally Psychiatric: Patient is competent for consent with normal mood and affect Lymphatic: No axillary or cervical lymphadenopathy  MUSCULOSKELETAL:  BUE: NTTP. No swelling / deformity / crepitus. Full ROM. RLE: mild swelling to ankle. Can SLR. Full painless ROM of knee. LLE: splint in place. BCR / SILT / wiggles toes.  Assessment: LEFT distal fibula fracture, nondisplaced  Plan: NWB LLE Keep splint clean and dry Ice, elevation PT/OT F/u with me 2 weeks after D/C    [name], MD Cell [phone]    05/03/2015 1:53 PM

## 2015-05-03 NOTE — Progress Notes (Signed)
  Echocardiogram 2D Echocardiogram has been performed.  Dustin [name] M 05/03/2015, 9:27 AM

## 2015-05-03 NOTE — Evaluation (Signed)
Physical Therapy Evaluation Patient Details Name: Dustin [name] MRN: [id number] DOB: Aug 22, 1940 Today's Date: 05/03/2015   History of Present Illness  Pt is a 75 y/o M who had syncopal episode on 3/13 w/ closed Lt fibular fx, splint placed in ED.  Pt's PMH includes neuropathy, neck pain, Rt shoulder arthroscopy, ACDF.    Clinical Impression  Pt admitted with above diagnosis. Pt currently with functional limitations due to the deficits listed below (see PT Problem List). Mr. Kilmer is at supervision level of functioning and demonstrated ability to ambulate NWB Lt LE using RW.  He will be returning home w/ his wife and will have assist available from his two sons prn/intermittently. Pt will benefit from skilled PT to increase their independence and safety with mobility to allow discharge to the venue listed below.      Follow Up Recommendations Home health PT;Supervision for mobility/OOB    Equipment Recommendations  None recommended by PT (RW has already been delivered to room)    Recommendations for Other Services       Precautions / Restrictions Precautions Precautions: Fall Precaution Comments: Seizure, Fall, DNR Restrictions Weight Bearing Restrictions: Yes LLE Weight Bearing: Non weight bearing Other Position/Activity Restrictions: Previously instructed WBAT by Internal Medicine; however, most recent ortho note specifies NWB Lt LE      Mobility  Bed Mobility Overal bed mobility: Independent             General bed mobility comments: no cues or physical assist needed  Transfers Overall transfer level: Needs assistance Equipment used: Rolling walker (2 wheeled) Transfers: Sit to/from Stand Sit to Stand: Supervision         General transfer comment: Supervision for safety w/ sit<>stand from bed/chair/commode  Ambulation/Gait Ambulation/Gait assistance: Supervision Ambulation Distance (Feet): 100 Feet Assistive device: Rolling walker (2 wheeled) Gait  Pattern/deviations:  (hop on Rt LE)   Gait velocity interpretation: Below normal speed for age/gender General Gait Details: Cues for NWB.  Pt steady and supervision provided for safety.  Stairs Stairs: Yes Stairs assistance: Min assist Stair Management: No rails;Backwards Number of Stairs: 1 General stair comments: Assist to stabilize RW.  Pt informed therapist after stair training that he has a ramp at home.  Wheelchair Mobility    Modified Rankin (Stroke Patients Only)       Balance Overall balance assessment: Needs assistance Sitting-balance support: No upper extremity supported;Feet supported Sitting balance-Leahy Scale: Normal     Standing balance support: Bilateral upper extremity supported;During functional activity Standing balance-Leahy Scale: Poor Standing balance comment: Relies on RW for support to maintain NWB Lt LE                             Pertinent Vitals/Pain Pain Assessment: Faces Faces Pain Scale: Hurts a little bit Pain Location: Lt foot Pain Descriptors / Indicators: Aching Pain Intervention(s): Limited activity within patient's tolerance;Monitored during session;Repositioned    Home Living Family/patient expects to be discharged to:: Private residence Living Arrangements: Spouse/significant other Available Help at Discharge: Family;Available PRN/intermittently (wife to provide supervision but also has broken her ankle) Type of Home: House Home Access: Stairs to enter;Ramped entrance Entrance Stairs-Rails: Left;Right;Can reach both Entrance Stairs-Number of Steps: 3 Home Layout: Two level;Able to live on main level with bedroom/bathroom Home Equipment: Gilford Rile - 2 wheels;Shower seat;Grab bars - tub/shower;Hand held shower head;Wheelchair - manual Additional Comments: Pt recently built a ramp for his wife.  Sons available to assist prn/intermittently.  Prior Function Level of Independence: Independent               Hand  Dominance   Dominant Hand: Left    Extremity/Trunk Assessment   Upper Extremity Assessment: Defer to OT evaluation           Lower Extremity Assessment: LLE deficits/detail   LLE Deficits / Details: limited ankle/foot ROM due to splint in place     Communication   Communication: No difficulties  Cognition Arousal/Alertness: Awake/alert Behavior During Therapy: WFL for tasks assessed/performed Overall Cognitive Status: Within Functional Limits for tasks assessed                      General Comments      Exercises General Exercises - Lower Extremity Long Arc Quad: AROM;Left;10 reps;Seated      Assessment/Plan    PT Assessment Patient needs continued PT services  PT Diagnosis Difficulty walking;Acute pain   PT Problem List Decreased strength;Decreased range of motion;Decreased activity tolerance;Decreased balance;Decreased knowledge of use of DME;Decreased safety awareness;Decreased knowledge of precautions;Pain  PT Treatment Interventions DME instruction;Gait training;Stair training;Functional mobility training;Therapeutic activities;Therapeutic exercise;Balance training;Patient/family education   PT Goals (Current goals can be found in the Care Plan section) Acute Rehab PT Goals Patient Stated Goal: to go home today PT Goal Formulation: With patient Time For Goal Achievement: 05/10/15 Potential to Achieve Goals: Good    Frequency Min 3X/week   Barriers to discharge Decreased caregiver support      Co-evaluation               End of Session Equipment Utilized During Treatment: Gait belt Activity Tolerance: Patient tolerated treatment well Patient left: in bed;with call bell/phone within reach;with bed alarm set;Other (comment) (sitting EOB w/ CM at bedside) Nurse Communication: Mobility status;Weight bearing status    Functional Assessment Tool Used: Clinical Judgement Functional Limitation: Mobility: Walking and moving around Mobility:  Walking and Moving Around Current Status [id number]): At least 1 percent but less than 20 percent impaired, limited or restricted Mobility: Walking and Moving Around Goal Status [phone]): At least 1 percent but less than 20 percent impaired, limited or restricted    Time: 1356-1425 PT Time Calculation (min) (ACUTE ONLY): 29 min   Charges:   PT Evaluation $PT Eval Low Complexity: 1 Procedure PT Treatments $Gait Training: 8-22 mins   PT G Codes:   PT G-Codes **NOT FOR INPATIENT CLASS** Functional Assessment Tool Used: Clinical Judgement Functional Limitation: Mobility: Walking and moving around Mobility: Walking and Moving Around Current Status [id number]): At least 1 percent but less than 20 percent impaired, limited or restricted Mobility: Walking and Moving Around Goal Status [phone]): At least 1 percent but less than 20 percent impaired, limited or restricted   Collie Siad PT, DPT  Pager: [phone] Phone: [phone] 05/03/2015, 2:43 PM

## 2015-05-03 NOTE — Evaluation (Signed)
Occupational Therapy Evaluation Patient Details Name: Dustin [name] MRN: [id number] DOB: Sep 08, 1940 Today's Date: 05/03/2015    History of Present Illness With history of depression, BPH, insomnia on high doses of Seroquel, peripheral neuropathy be managed by neurology, chronic pain presenting to the ED after a syncopal episode. Patient stated that he has a history of orthostasis and 5 days prior to admission was seen at PCPs office at which point in time his urotraxal was discontinued as well as propranolol . Patient states 1 day prior to admission he got up to go to the bathroom and return on the shower while waiting for the water to get high and he passed out for a few seconds and felt dizzy prior to that. Patient stated that he knows he was a few seconds because when he got up the water was still cold. Splint was placed on LLE by Ortho Tech and pt was admitted on 05/02/15 w/ Dx: [name]   Clinical Impression   Pt is currently Mod I functional mobility and transfers related to basic ADL's using RW. He has DME and family PRN assistance. Pt reports no further acute OT needs and is hopeful to d/c home later today if medically able. Will sign off acute OT.    Follow Up Recommendations  No OT follow up;Supervision - Intermittent (Pt reports that family can provide assistance as needed.)    Equipment Recommendations  None recommended by OT;Other (comment) (Pt reports that he has DME)    Recommendations for Other Services       Precautions / Restrictions Precautions Precautions: Fall;Other (comment) Precaution Comments: Seizure, Fall, DNR Restrictions Weight Bearing Restrictions: Yes LLE Weight Bearing: Weight bearing as tolerated Other Position/Activity Restrictions: Closed left fibular fracture. PT contacted and spoke with ortho MD earlier today.      Mobility Bed Mobility Overal bed mobility: Independent                Transfers Overall transfer level: Modified  independent Equipment used: Rolling walker (2 wheeled)             General transfer comment: Sit to stand from bed, toilet and SPT to toilet using RW.     Balance Overall balance assessment: Modified Independent;No apparent balance deficits (not formally assessed)                                          ADL Overall ADL's : Modified independent;At baseline                                       General ADL Comments: Pt was assessed by acute OT and was noted to be Mod I LB dressing seated and Mod I transfers using RW and ambulating from EOB to bathroom. He is I eating, toilet transfer was Mod I using RW. Pt has handicapped height toilets & 3:1 at home which should increase independence and safety. Pt also has necessary DME. Reviewed shower transfers. Discussed posible HHOT as pt is also caregiver for his wife. They use a home care agency and he plans to cont with this and states that his family/children can assist if necessary. Pt politely declined further OT stating that he "Is fine", he also states that he has no further questions or concerns at this time. Will sign off.  Vision  No change from baseline   Perception     Praxis      Pertinent Vitals/Pain Pain Assessment: 0-10 Pain Score: 5  Pain Location: Left foot Pain Descriptors / Indicators: Aching;Sore Pain Intervention(s): Limited activity within patient's tolerance;Monitored during session;Repositioned     Hand Dominance Left   Extremity/Trunk Assessment Upper Extremity Assessment Upper Extremity Assessment: Overall WFL for tasks assessed (Grossly 5/5 bilateral UE's)   Lower Extremity Assessment Lower Extremity Assessment: Defer to PT evaluation       Communication Communication Communication: No difficulties   Cognition Arousal/Alertness: Awake/alert Behavior During Therapy: WFL for tasks assessed/performed Overall Cognitive Status: Within Functional Limits for tasks  assessed                     General Comments       Exercises       Shoulder Instructions      Home Living Family/patient expects to be discharged to:: Private residence Living Arrangements: Spouse/significant other Available Help at Discharge: Family;Other (Comment) (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) Type of Home: House Home Access: Stairs to enter [employer] of Steps: 3 Entrance Stairs-Rails: Can reach both Home Layout: Two level;Able to live on main level with bedroom/bathroom     Bathroom Shower/Tub: Walk-in shower   Bathroom Toilet: Handicapped height     Home Equipment: Environmental consultant - 2 wheels;Shower seat;Grab bars - tub/shower;Hand held shower head          Prior Functioning/Environment Level of Independence: Independent             OT Diagnosis:     OT Problem List:     OT Treatment/Interventions:      OT Goals(Current goals can be found in the care plan section) Acute Rehab OT Goals Patient Stated Goal: Go home later today OT Goal Formulation: All assessment and education complete, DC therapy  OT Frequency:     Barriers to D/C:            Co-evaluation              End of Session Equipment Utilized During Treatment: Gait belt;Rolling walker Nurse Communication: Mobility status  Activity Tolerance: Patient tolerated treatment well Patient left: in bed;with call bell/phone within reach;with nursing/sitter in room (Sitting EOB)   Time: [id number] OT Time Calculation (min): 22 min Charges:  OT General Charges $OT Visit: 1 Procedure OT Evaluation $OT Eval Moderate Complexity: 1 Procedure G-Codes: OT G-codes **NOT FOR INPATIENT CLASS** Functional Assessment Tool Used: Clinical judgement Functional Limitation: Self care Self Care Current Status [id number]): At least 1 percent but less than 20 percent impaired, limited or restricted Self Care Goal Status [id number]): 0 percent impaired, limited or restricted Self Care Discharge  Status [phone]): At least 1 percent but less than 20 percent impaired, limited or restricted  Almyra Deforest, OTR/L 05/03/2015, 10:14 AM

## 2015-05-03 NOTE — Progress Notes (Signed)
Reviewed discharge instructions with patient and he stated his understanding.  Patient apprehensive about taking percocet at home due to him attending pain clinic.  Instructed to contact the pain clinic to inform him of his current fib fracture diagnosis and the need for breakthrough pain medication at times. Discharged home with son via wheelchair. Sanda Linger

## 2015-05-03 NOTE — Discharge Instructions (Signed)
Keep splint clean and dry. Do not remove.  Elevate your ankle to level of heart. Non weight bearing left lower extremity with walker.   Syncope Syncope is a medical term for fainting or passing out. This means you lose consciousness and drop to the ground. People are generally unconscious for less than 5 minutes. You may have some muscle twitches for up to 15 seconds before waking up and returning to normal. Syncope occurs more often in older adults, but it can happen to anyone. While most causes of syncope are not dangerous, syncope can be a sign of a serious medical problem. It is important to seek medical care.  CAUSES  Syncope is caused by a sudden drop in blood flow to the brain. The specific cause is often not determined. Factors that can bring on syncope include:  Taking medicines that lower blood pressure.  Sudden changes in posture, such as standing up quickly.  Taking more medicine than prescribed.  Standing in one place for too long.  Seizure disorders.  Dehydration and excessive exposure to heat.  Low blood sugar (hypoglycemia).  Straining to have a bowel movement.  Heart disease, irregular heartbeat, or other circulatory problems.  Fear, emotional distress, seeing blood, or severe pain. SYMPTOMS  Right before fainting, you may:  Feel dizzy or light-headed.  Feel nauseous.  See all white or all black in your field of vision.  Have cold, clammy skin. DIAGNOSIS  Your health care provider will ask about your symptoms, perform a physical exam, and perform an electrocardiogram (ECG) to record the electrical activity of your heart. Your health care provider may also perform other heart or blood tests to determine the cause of your syncope which may include:  Transthoracic echocardiogram (TTE). During echocardiography, sound waves are used to evaluate how blood flows through your heart.  Transesophageal echocardiogram (TEE).  Cardiac monitoring. This allows your  health care provider to monitor your heart rate and rhythm in real time.  Holter monitor. This is a portable device that records your heartbeat and can help diagnose heart arrhythmias. It allows your health care provider to track your heart activity for several days, if needed.  Stress tests by exercise or by giving medicine that makes the heart beat faster. TREATMENT  In most cases, no treatment is needed. Depending on the cause of your syncope, your health care provider may recommend changing or stopping some of your medicines. HOME CARE INSTRUCTIONS  Have someone stay with you until you feel stable.  Do not drive, use machinery, or play sports until your health care provider says it is okay.  Keep all follow-up appointments as directed by your health care provider.  Lie down right away if you start feeling like you might faint. Breathe deeply and steadily. Wait until all the symptoms have passed.  Drink enough fluids to keep your urine clear or pale yellow.  If you are taking blood pressure or heart medicine, get up slowly and take several minutes to sit and then stand. This can reduce dizziness. SEEK IMMEDIATE MEDICAL CARE IF:   You have a severe headache.  You have unusual pain in the chest, abdomen, or back.  You are bleeding from your mouth or rectum, or you have black or tarry stool.  You have an irregular or very fast heartbeat.  You have pain with breathing.  You have repeated fainting or seizure-like jerking during an episode.  You faint when sitting or lying down.  You have confusion.  You have  trouble walking.  You have severe weakness.  You have vision problems. If you fainted, call your local emergency services (911 in [place] Do not drive yourself to the hospital.    This information is not intended to replace advice given to you by your health care provider. Make sure you discuss any questions you have with your health care provider.   Document Released:  02/04/2005 Document Revised: 06/21/2014 Document Reviewed: 04/05/2011 Elsevier Interactive Patient Education [employer].

## 2015-05-04 LAB — URINE CULTURE: Culture: NO GROWTH

## 2015-05-05 ENCOUNTER — Other Ambulatory Visit: Payer: Self-pay

## 2015-05-05 DIAGNOSIS — W19XXXD Unspecified fall, subsequent encounter: Secondary | ICD-10-CM | POA: Diagnosis not present

## 2015-05-05 DIAGNOSIS — G629 Polyneuropathy, unspecified: Secondary | ICD-10-CM | POA: Diagnosis not present

## 2015-05-05 DIAGNOSIS — S82402D Unspecified fracture of shaft of left fibula, subsequent encounter for closed fracture with routine healing: Secondary | ICD-10-CM | POA: Diagnosis not present

## 2015-05-05 DIAGNOSIS — K219 Gastro-esophageal reflux disease without esophagitis: Secondary | ICD-10-CM | POA: Diagnosis not present

## 2015-05-05 DIAGNOSIS — F329 Major depressive disorder, single episode, unspecified: Secondary | ICD-10-CM | POA: Diagnosis not present

## 2015-05-05 DIAGNOSIS — S92402D Displaced unspecified fracture of left great toe, subsequent encounter for fracture with routine healing: Secondary | ICD-10-CM | POA: Diagnosis not present

## 2015-05-05 NOTE — Patient Outreach (Signed)
[name]) Care Management  05/05/2015  Dustin [name] 1940-09-14 [id number]   75 year old with recent admission 3/14-3/15 for syncope-fracture of left fibular. RNCM called for transition of care. No answer. HIPPA compliant message left.  Plan: follow up call next week.  Thea Silversmith, RN, MSN, [place] Coordinator Cell: [phone]

## 2015-05-08 DIAGNOSIS — F329 Major depressive disorder, single episode, unspecified: Secondary | ICD-10-CM | POA: Diagnosis not present

## 2015-05-08 DIAGNOSIS — S82402D Unspecified fracture of shaft of left fibula, subsequent encounter for closed fracture with routine healing: Secondary | ICD-10-CM | POA: Diagnosis not present

## 2015-05-08 DIAGNOSIS — K219 Gastro-esophageal reflux disease without esophagitis: Secondary | ICD-10-CM | POA: Diagnosis not present

## 2015-05-08 DIAGNOSIS — G629 Polyneuropathy, unspecified: Secondary | ICD-10-CM | POA: Diagnosis not present

## 2015-05-08 DIAGNOSIS — W19XXXD Unspecified fall, subsequent encounter: Secondary | ICD-10-CM | POA: Diagnosis not present

## 2015-05-09 ENCOUNTER — Other Ambulatory Visit: Payer: Self-pay

## 2015-05-09 DIAGNOSIS — R55 Syncope and collapse: Secondary | ICD-10-CM | POA: Diagnosis not present

## 2015-05-09 DIAGNOSIS — N401 Enlarged prostate with lower urinary tract symptoms: Secondary | ICD-10-CM | POA: Diagnosis not present

## 2015-05-09 DIAGNOSIS — D649 Anemia, unspecified: Secondary | ICD-10-CM | POA: Diagnosis not present

## 2015-05-09 DIAGNOSIS — F321 Major depressive disorder, single episode, moderate: Secondary | ICD-10-CM | POA: Diagnosis not present

## 2015-05-10 ENCOUNTER — Other Ambulatory Visit: Payer: Self-pay

## 2015-05-11 DIAGNOSIS — K219 Gastro-esophageal reflux disease without esophagitis: Secondary | ICD-10-CM | POA: Diagnosis not present

## 2015-05-11 DIAGNOSIS — F329 Major depressive disorder, single episode, unspecified: Secondary | ICD-10-CM | POA: Diagnosis not present

## 2015-05-11 DIAGNOSIS — G629 Polyneuropathy, unspecified: Secondary | ICD-10-CM | POA: Diagnosis not present

## 2015-05-11 DIAGNOSIS — S82402D Unspecified fracture of shaft of left fibula, subsequent encounter for closed fracture with routine healing: Secondary | ICD-10-CM | POA: Diagnosis not present

## 2015-05-11 DIAGNOSIS — W19XXXD Unspecified fall, subsequent encounter: Secondary | ICD-10-CM | POA: Diagnosis not present

## 2015-05-12 NOTE — Patient Outreach (Addendum)
[facility]) Care Management  05/10/15  PRATHIK MAGOUIRK 1940-03-08 [id number]  Assessment: 75 year old with recent admission due to syncope/fracture Left fibular. Transition of care call complete. Member without questions or concerns. Medication discussed. Member reports he has all the medications that were ordered. RNCM reinforced non weight bearing of left leg.  Plan: transition of care call next week.  Thea Silversmith, RN, MSN, [place] Coordinator Cell: [phone]

## 2015-05-12 NOTE — Patient Outreach (Signed)
[facility]) Care Management  05/12/2015  NACHO DICE [birth year] [id number]  Late Entry for 05/09/15 15:30- RNCM called member for transition of care. Person answering the phone stated member was not there. HIPPA compliant message left.  Plan: Follow up call tomorrow.  Thea Silversmith, RN, MSN, [place] Coordinator Cell: [phone]

## 2015-05-15 DIAGNOSIS — W19XXXD Unspecified fall, subsequent encounter: Secondary | ICD-10-CM | POA: Diagnosis not present

## 2015-05-15 DIAGNOSIS — F329 Major depressive disorder, single episode, unspecified: Secondary | ICD-10-CM | POA: Diagnosis not present

## 2015-05-15 DIAGNOSIS — G629 Polyneuropathy, unspecified: Secondary | ICD-10-CM | POA: Diagnosis not present

## 2015-05-15 DIAGNOSIS — S82402D Unspecified fracture of shaft of left fibula, subsequent encounter for closed fracture with routine healing: Secondary | ICD-10-CM | POA: Diagnosis not present

## 2015-05-15 DIAGNOSIS — K219 Gastro-esophageal reflux disease without esophagitis: Secondary | ICD-10-CM | POA: Diagnosis not present

## 2015-05-16 ENCOUNTER — Other Ambulatory Visit: Payer: Self-pay

## 2015-05-17 DIAGNOSIS — G629 Polyneuropathy, unspecified: Secondary | ICD-10-CM | POA: Diagnosis not present

## 2015-05-17 DIAGNOSIS — S82402D Unspecified fracture of shaft of left fibula, subsequent encounter for closed fracture with routine healing: Secondary | ICD-10-CM | POA: Diagnosis not present

## 2015-05-17 DIAGNOSIS — F329 Major depressive disorder, single episode, unspecified: Secondary | ICD-10-CM | POA: Diagnosis not present

## 2015-05-17 DIAGNOSIS — W19XXXD Unspecified fall, subsequent encounter: Secondary | ICD-10-CM | POA: Diagnosis not present

## 2015-05-17 DIAGNOSIS — K219 Gastro-esophageal reflux disease without esophagitis: Secondary | ICD-10-CM | POA: Diagnosis not present

## 2015-05-17 NOTE — Patient Outreach (Signed)
[facility]) Care Management  [place]  05/16/2015   Dustin [name] [birth year] [id number]  Subjective: member reports his leg is doing better since hospitalization, denies dizziness.  Objective: BP 118/62 mmHg  Pulse 66  Resp 16  SpO2 96% , lungs clear, heart rate regular. Cast to left leg. Left foot with old bruising noted to top. Small amount of edema noted. Good capillary refill to toes on left foot.  Current Medications:  Current Outpatient Prescriptions  Medication Sig Dispense Refill  . ARIPiprazole (ABILIFY) 2 MG tablet Take 2 mg by mouth daily.    . cholecalciferol (VITAMIN D) 1000 UNITS tablet Take 2,000 Units by mouth daily.     . finasteride (PROSCAR) 5 MG tablet Take 5 mg by mouth daily.      . multivitamin (METANX) 3-35-2 MG TABS tablet Take 1 tablet by mouth daily. 30 tablet 11  . oxyCODONE-acetaminophen (PERCOCET) 10-325 MG tablet Take 1 tablet by mouth every 6 (six) hours as needed for pain. 30 tablet 0  . OXYCONTIN 20 MG T12A 12 hr tablet TAKE 1 TABLET BY ORAL ROUTE EVERY 12 HOURS (DNF 09/24/14)  0  . pantoprazole (PROTONIX) 40 MG tablet Take 40 mg by mouth daily.      . polyethylene glycol (MIRALAX / GLYCOLAX) packet Take 17 g by mouth daily as needed for mild constipation. 30 each 0  . QUEtiapine (SEROQUEL) 300 MG tablet Take 600 mg by mouth at bedtime. Reported on 05/10/2015    . temazepam (RESTORIL) 15 MG capsule Take 15 mg by mouth at bedtime.    . vitamin B-12 (CYANOCOBALAMIN) 1000 MCG tablet Take 1,000 mcg by mouth daily.    . Vortioxetine HBr (TRINTELLIX) 10 MG TABS Take 10 mg by mouth daily. Reported on 05/10/2015    . senna (SENOKOT) 8.6 MG TABS tablet Take 1 tablet (8.6 mg total) by mouth 2 (two) times daily. For constipation (Patient not taking: Reported on 05/10/2015) 120 each 0  . sertraline (ZOLOFT) 100 MG tablet Reported on 05/16/2015  0   No current facility-administered medications for this visit.    Functional Status:  In  your present state of health, do you have any difficulty performing the following activities: 05/16/2015 05/02/2015  Hearing? Y N  Vision? N N  Difficulty concentrating or making decisions? N N  Walking or climbing stairs? Y Y  Dressing or bathing? N N  Doing errands, shopping? N N  Preparing Food and eating ? N -  Using the Toilet? N -  In the past six months, have you accidently leaked urine? N -  Do you have problems with loss of bowel control? N -  Managing your Medications? N -  Managing your Finances? N -  Housekeeping or managing your Housekeeping? Y -    Fall/Depression Screening: PHQ 2/9 Scores 05/16/2015  PHQ - 2 Score 6  PHQ- 9 Score 10    Assessment: 75 year old with recent admission due to fall and fracture of left fibula due to syncope. Member reports he has not had any difficulty since his blood pressure medications have been changed. Member's wife present during home visit. Member also had close friend from out of town visiting them to help out. Member denies any issues at this time.  History of depression. PHQ 2 score 6 and PHQ 9 score 10. Member reports history of depression and reports his depression is no worse than it has been. Member reports it is controlled with his medication.  Member declines any assistance with counseling.   Plan: transition of care call next week.  Thea Silversmith, RN, MSN, [place] Coordinator Cell: [phone]

## 2015-05-18 DIAGNOSIS — M47816 Spondylosis without myelopathy or radiculopathy, lumbar region: Secondary | ICD-10-CM | POA: Diagnosis not present

## 2015-05-18 DIAGNOSIS — Z79899 Other long term (current) drug therapy: Secondary | ICD-10-CM | POA: Diagnosis not present

## 2015-05-18 DIAGNOSIS — M4802 Spinal stenosis, cervical region: Secondary | ICD-10-CM | POA: Diagnosis not present

## 2015-05-18 DIAGNOSIS — M545 Low back pain: Secondary | ICD-10-CM | POA: Diagnosis not present

## 2015-05-18 DIAGNOSIS — M5417 Radiculopathy, lumbosacral region: Secondary | ICD-10-CM | POA: Diagnosis not present

## 2015-05-18 DIAGNOSIS — Z6826 Body mass index (BMI) 26.0-26.9, adult: Secondary | ICD-10-CM | POA: Diagnosis not present

## 2015-05-18 DIAGNOSIS — M4712 Other spondylosis with myelopathy, cervical region: Secondary | ICD-10-CM | POA: Diagnosis not present

## 2015-05-18 DIAGNOSIS — M47817 Spondylosis without myelopathy or radiculopathy, lumbosacral region: Secondary | ICD-10-CM | POA: Diagnosis not present

## 2015-05-19 ENCOUNTER — Other Ambulatory Visit: Payer: Self-pay | Admitting: *Deleted

## 2015-05-19 DIAGNOSIS — S82492D Other fracture of shaft of left fibula, subsequent encounter for closed fracture with routine healing: Secondary | ICD-10-CM | POA: Diagnosis not present

## 2015-05-19 NOTE — Patient Outreach (Signed)
[name]) Care Management  05/19/2015  Dustin [name] 1940/03/18 [id number]   Patient triggered RED on EMMI Stroke Dashboard, notification sent to Thea Silversmith, RN.  Thanks, [name]. [place], [place] Assistant Phone: [phone] Fax: [phone]

## 2015-05-19 NOTE — Patient Outreach (Signed)
Covering for assigned care manager, J. Juleen China.  Notified by care management assistant that member appeared red on EMMI stroke dashboard, indicating that a follow up appointment with his physician has not occurred.  Call placed to member as a follow up and to discuss the importance follow up appointment.  No answer, HIPPA complaint voice message left.  Will update assigned care manager upon her return.  Dustin [name], BSN, [place] Management  [facility] Care Manager [phone]

## 2015-05-22 ENCOUNTER — Other Ambulatory Visit: Payer: Self-pay

## 2015-05-22 NOTE — Patient Outreach (Signed)
[facility]) Care Management  05/22/2015  Dustin [name] 05/07/1940 [id number]  Assessment: 85 year of currently in the Transition of care program following admission for syncope-fracture of Left fibular per notes likely due to medications/hypotension. Transition of care call. No answer. HIPPA compliant message left.  Plan: await return call. Transition of care call next week.  Thea Silversmith, RN, MSN, [place] Coordinator Cell: [phone]

## 2015-05-23 DIAGNOSIS — S82402D Unspecified fracture of shaft of left fibula, subsequent encounter for closed fracture with routine healing: Secondary | ICD-10-CM | POA: Diagnosis not present

## 2015-05-23 DIAGNOSIS — K219 Gastro-esophageal reflux disease without esophagitis: Secondary | ICD-10-CM | POA: Diagnosis not present

## 2015-05-23 DIAGNOSIS — W19XXXD Unspecified fall, subsequent encounter: Secondary | ICD-10-CM | POA: Diagnosis not present

## 2015-05-23 DIAGNOSIS — G629 Polyneuropathy, unspecified: Secondary | ICD-10-CM | POA: Diagnosis not present

## 2015-05-23 DIAGNOSIS — F329 Major depressive disorder, single episode, unspecified: Secondary | ICD-10-CM | POA: Diagnosis not present

## 2015-05-25 ENCOUNTER — Ambulatory Visit: Payer: Medicare Other | Admitting: Neurology

## 2015-05-29 ENCOUNTER — Other Ambulatory Visit: Payer: Self-pay

## 2015-05-29 NOTE — Patient Outreach (Signed)
[facility]) Care Management  05/29/2015  Dustin [name] 06-04-1940 [id number]  Assessment: RNCM called for transition of care call. Member was not in. HIPPA compliant message left.  Plan: await return call. Follow up call next week.  Thea Silversmith, RN, MSN, [place] Coordinator Cell: [phone]

## 2015-05-30 DIAGNOSIS — F329 Major depressive disorder, single episode, unspecified: Secondary | ICD-10-CM | POA: Diagnosis not present

## 2015-05-30 DIAGNOSIS — G629 Polyneuropathy, unspecified: Secondary | ICD-10-CM | POA: Diagnosis not present

## 2015-05-30 DIAGNOSIS — K219 Gastro-esophageal reflux disease without esophagitis: Secondary | ICD-10-CM | POA: Diagnosis not present

## 2015-05-30 DIAGNOSIS — S82402D Unspecified fracture of shaft of left fibula, subsequent encounter for closed fracture with routine healing: Secondary | ICD-10-CM | POA: Diagnosis not present

## 2015-05-30 DIAGNOSIS — W19XXXD Unspecified fall, subsequent encounter: Secondary | ICD-10-CM | POA: Diagnosis not present

## 2015-06-05 ENCOUNTER — Other Ambulatory Visit: Payer: Self-pay

## 2015-06-05 NOTE — Patient Outreach (Addendum)
[facility] [facility]) Care Management  06/05/2015  Dustin [name] [birth year] [id number]  RNCM called for transition of care. Member hospitalized 3/14-3/16 due to syncope/fall/ Fracture of the left fibular. Member denies any issues at this time-no dizziness or faint feelings. RNCM discussed follow up appointment with primary care. Member reports he and his wife had an appointment at the same time and his wife was not feeling well. Member reports he has rescheduled with primary care and will be seen in a couple weeks. Member reports he has been discharged from home health physical therapy Member denies any issues with his blood pressure. Member reports he will be getting his cast off next week. Transition of care program complete. Member denies any other care management needs.  RNCM verified that member has RNCM's contact number. Member is aware he can call 24 hour nurse advice line as needed.  Plan: close case.  Thea Silversmith, RN, MSN, [place] Coordinator Cell: [phone]

## 2015-06-14 DIAGNOSIS — S82492D Other fracture of shaft of left fibula, subsequent encounter for closed fracture with routine healing: Secondary | ICD-10-CM | POA: Diagnosis not present

## 2015-06-29 DIAGNOSIS — D649 Anemia, unspecified: Secondary | ICD-10-CM | POA: Diagnosis not present

## 2015-07-06 DIAGNOSIS — F321 Major depressive disorder, single episode, moderate: Secondary | ICD-10-CM | POA: Diagnosis not present

## 2015-08-02 DIAGNOSIS — S82492D Other fracture of shaft of left fibula, subsequent encounter for closed fracture with routine healing: Secondary | ICD-10-CM | POA: Diagnosis not present

## 2015-08-16 DIAGNOSIS — M4802 Spinal stenosis, cervical region: Secondary | ICD-10-CM | POA: Diagnosis not present

## 2015-08-16 DIAGNOSIS — M4712 Other spondylosis with myelopathy, cervical region: Secondary | ICD-10-CM | POA: Diagnosis not present

## 2015-08-16 DIAGNOSIS — M47816 Spondylosis without myelopathy or radiculopathy, lumbar region: Secondary | ICD-10-CM | POA: Diagnosis not present

## 2015-08-16 DIAGNOSIS — M47817 Spondylosis without myelopathy or radiculopathy, lumbosacral region: Secondary | ICD-10-CM | POA: Diagnosis not present

## 2015-08-16 DIAGNOSIS — M5417 Radiculopathy, lumbosacral region: Secondary | ICD-10-CM | POA: Diagnosis not present

## 2015-08-16 DIAGNOSIS — M545 Low back pain: Secondary | ICD-10-CM | POA: Diagnosis not present

## 2015-08-16 DIAGNOSIS — Z6825 Body mass index (BMI) 25.0-25.9, adult: Secondary | ICD-10-CM | POA: Diagnosis not present

## 2015-08-18 DIAGNOSIS — N401 Enlarged prostate with lower urinary tract symptoms: Secondary | ICD-10-CM | POA: Diagnosis not present

## 2015-08-18 DIAGNOSIS — R351 Nocturia: Secondary | ICD-10-CM | POA: Diagnosis not present

## 2015-08-18 DIAGNOSIS — R972 Elevated prostate specific antigen [PSA]: Secondary | ICD-10-CM | POA: Diagnosis not present

## 2015-08-24 DIAGNOSIS — F321 Major depressive disorder, single episode, moderate: Secondary | ICD-10-CM | POA: Diagnosis not present

## 2015-08-30 DIAGNOSIS — L570 Actinic keratosis: Secondary | ICD-10-CM | POA: Diagnosis not present

## 2015-08-30 DIAGNOSIS — Z85828 Personal history of other malignant neoplasm of skin: Secondary | ICD-10-CM | POA: Diagnosis not present

## 2015-08-30 DIAGNOSIS — D1801 Hemangioma of skin and subcutaneous tissue: Secondary | ICD-10-CM | POA: Diagnosis not present

## 2015-08-30 DIAGNOSIS — L821 Other seborrheic keratosis: Secondary | ICD-10-CM | POA: Diagnosis not present

## 2015-08-30 DIAGNOSIS — L814 Other melanin hyperpigmentation: Secondary | ICD-10-CM | POA: Diagnosis not present

## 2015-09-07 DIAGNOSIS — G629 Polyneuropathy, unspecified: Secondary | ICD-10-CM | POA: Diagnosis not present

## 2015-09-07 DIAGNOSIS — M79661 Pain in right lower leg: Secondary | ICD-10-CM | POA: Diagnosis not present

## 2015-09-07 DIAGNOSIS — M79662 Pain in left lower leg: Secondary | ICD-10-CM | POA: Diagnosis not present

## 2015-09-07 DIAGNOSIS — M5090 Cervical disc disorder, unspecified, unspecified cervical region: Secondary | ICD-10-CM | POA: Diagnosis not present

## 2015-09-25 ENCOUNTER — Encounter: Payer: Self-pay | Admitting: Nurse Practitioner

## 2015-09-25 ENCOUNTER — Ambulatory Visit (INDEPENDENT_AMBULATORY_CARE_PROVIDER_SITE_OTHER): Payer: Medicare Other | Admitting: Nurse Practitioner

## 2015-09-25 VITALS — BP 109/68 | HR 81 | Ht 74.0 in | Wt 201.8 lb

## 2015-09-25 DIAGNOSIS — G608 Other hereditary and idiopathic neuropathies: Secondary | ICD-10-CM | POA: Diagnosis not present

## 2015-09-25 DIAGNOSIS — G6289 Other specified polyneuropathies: Secondary | ICD-10-CM

## 2015-09-25 DIAGNOSIS — R413 Other amnesia: Secondary | ICD-10-CM | POA: Diagnosis not present

## 2015-09-25 DIAGNOSIS — F329 Major depressive disorder, single episode, unspecified: Secondary | ICD-10-CM | POA: Diagnosis not present

## 2015-09-25 DIAGNOSIS — F32A Depression, unspecified: Secondary | ICD-10-CM

## 2015-09-25 MED ORDER — OXCARBAZEPINE 150 MG PO TABS: 150.0000 mg | ORAL_TABLET | Freq: Two times a day (BID) | ORAL | 6 refills | Status: DC

## 2015-09-25 MED ORDER — NORTRIPTYLINE HCL 25 MG PO CAPS: 50.0000 mg | ORAL_CAPSULE | Freq: Every day | ORAL | 6 refills | Status: DC

## 2015-09-25 NOTE — Patient Instructions (Signed)
Trileptal 150 mg twice a day Nortriptyline 25 mg 2 tabs at at bedtime Continue metanyx

## 2015-09-25 NOTE — Progress Notes (Addendum)
[facility]  PATIENT: Dustin [name] DOB: April 30, 1940   REASON FOR VISIT: Follow-up for peripheral neuropathy and memory loss HISTORY FROM: Patient  HISTORY OF PRESENT ILLNESS:Arnie R Reager 75 yo handed male, His primary care physician is Dr. Lujean Amel at [facility] Mr. 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 He has history of chronic low back pain, no radiating pain, has been receiving epidural injection every few months, which was helpful, he denies gait difficulty, he denies bowel and bladder incontinence.  UPDATE Dec 26 2014:YY He continue complains unbearable bilateral feet pain, especially by evening time, he has been taking gabapentin 400 mg 1/1/2 tablets, and Trileptal 150 mg twice a day without significant improvement He denies low back pain, no gait difficulty, no bowel bladder incontinence, today's examination find mild bilateral toe extension to flexion weakness, areflexia, he decided to try different medication, hold of suggested evaluation such as EMG nerve conduction study MRI of lumbar at this point  UPDATE Jan 30 2015:YYHe still complains of bilateral feet paresthesia, he took 400 mg  of seroquel, help him sleep. He continues to complains of bilateral feet paresthesia. Mostly at evening time, after bearing weight  He also complains of memeoyr trouble. Today's Mini-Mental Status Examination is 29 out of 30 I also reviewed his record from [facility], he has the diagnosis of major depressive disorder, moderate.  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 He has no family history of memory loss I personally reviewed brain in March 2014, mild atrophy. Small vessel disease  UPDATE Feb 7th 2017:YY He has been seen by psychologist for depression, which is under suboptimal control, he is now taking zoloft 100mg  daily, he sleeps well, he is the [facility] giver of his wife, she has insulin dependent DM,  He still complains of bilateral feet paresthesia, gets worse in the afternoon, he is taking trileptal 150mg  bid, not much benefit, no significant side effect noticed either. UPDATE 09/25/2015 CM Mr. Vogeler, 75 year old male returns for follow-up. He has previously been followed in this office by Dr. Krista Blue. His bilateral feet paresthesias are much better however he is complaining of pain in the bilateral thigh region. According to Dr. Rhea Belton last note, he was to continue Trileptal and nortriptyline. He thinks he is taking nortriptyline at night but is not sure about Trileptal. These have been taking off HIS medication list. He is on Metanx. He continues to go to  counseling for his depression and he is currently on Seroquel and Abilify. He continues to care for his wife who has COPD. He has a long history of low back pain and sees Dr. Vertell Limber about every year for injections. He returns for reevaluation   REVIEW OF SYSTEMS: Full 14 system review of systems  performed and notable only for those listed, all others are neg:  Constitutional: neg  Cardiovascular: neg Ear/Nose/Throat: Hearing loss  Skin: neg Eyes: neg Respiratory: neg Gastroitestinal:  neg  Hematology/Lymphatic: neg  Endocrine: neg Musculoskeletal: Walking difficulty, neck pain Allergy/Immunology: neg Neurological: neg Psychiatric: Depression Sleep : neg   ALLERGIES: No Known Allergies  HOME MEDICATIONS: Outpatient Medications Prior to Visit  Medication Sig Dispense Refill  . ARIPiprazole (ABILIFY) 2 MG tablet Take 2 mg by mouth daily.    . cholecalciferol (VITAMIN D) 1000 UNITS tablet Take 1,000 Units by mouth daily.     . finasteride (PROSCAR) 5 MG tablet Take 5 mg by mouth daily.      . multivitamin (METANX) 3-35-2 MG TABS tablet Take 1 tablet by mouth daily. 30 tablet 11  . oxyCODONE-acetaminophen (PERCOCET) 10-325 MG tablet Take 1 tablet by mouth every 6 (six) hours as needed for pain. 30 tablet 0  . OXYCONTIN 20 MG T12A 12 hr tablet TAKE 1 TABLET BY ORAL ROUTE EVERY 12 HOURS (DNF 09/24/14)  0  . pantoprazole (PROTONIX) 40 MG tablet Take 40 mg by mouth daily.      . polyethylene glycol (MIRALAX / GLYCOLAX) packet Take 17 g by mouth daily as needed for mild constipation. 30 each 0  . QUEtiapine (SEROQUEL) 300 MG tablet Take 600 mg by mouth at bedtime. Reported on 05/10/2015    . temazepam (RESTORIL) 15 MG capsule Take 15 mg by mouth at bedtime.    . vitamin B-12 (CYANOCOBALAMIN) 1000 MCG tablet Take 1,000 mcg by mouth daily.    Marland Kitchen senna (SENOKOT) 8.6 MG TABS tablet Take 1 tablet (8.6 mg total) by mouth 2 (two) times daily. For constipation (Patient not taking: Reported on 09/25/2015) 120 each 0  . sertraline (ZOLOFT) 100 MG tablet Reported on 05/16/2015  0  . Vortioxetine HBr (TRINTELLIX) 10 MG TABS Take 10 mg by mouth daily. Reported on 05/10/2015     No facility-administered medications prior to visit.     PAST MEDICAL HISTORY: Past Medical History:  Diagnosis Date  . BPH (benign prostatic hyperplasia)   . GERD (gastroesophageal reflux disease)    protonix for control  . Insomnia   . Neck pain   . Neuropathy ([place])     PAST SURGICAL HISTORY: Past Surgical  History:  Procedure Laterality Date  . ANTERIOR CERVICAL DECOMPRESSION/DISCECTOMY FUSION 4 LEVELS N/A 10/27/2012   Procedure: Cervical Three-Four Cervical Four-Five Cervical Five-Six Cervical Six-Seven  Anterior cervical decompression/diskectomy/fusion;  Surgeon: Erline Levine, MD;  Location: [place] [facility];  Service: Neurosurgery;  Laterality: N/A;  Cervical Three-Four Cervical Four-Five Cervical Five-Six Cervical Six-Seven  Anterior cervical decompression/diskectomy/fusion  . SHOULDER ARTHROSCOPY  2010   rt shoulder  . SHOULDER ARTHROSCOPY  02/15/2011   Procedure: ARTHROSCOPY SHOULDER;  Surgeon: Cynda Familia;  Location: [facility];  Service: Orthopedics;  Laterality: Right;  Debridement  . TONSILLECTOMY      FAMILY HISTORY: History reviewed. No pertinent family history.  SOCIAL HISTORY: Social History   Social History  . Marital status: Married    Spouse name: [name]  . Number of children: 2  . Years of education: college   Occupational History  . retired    Social History Main Topics  . Smoking status: Former Smoker    Packs/day: 1.50    Quit date: 01/27/1990  . Smokeless tobacco: Never Used  . Alcohol use No  . Drug use: No  . Sexual activity: Not on file   Other Topics  Concern  . Not on file   Social History Narrative   [name]   Pt is retired   Education college    Pt is left handed   Pt consumes 2 cups of coffee daily     PHYSICAL EXAM  Vitals:   09/25/15 0920  BP: 109/68  Pulse: 81  Weight: 201 lb 12.8 oz (91.5 kg)  Height: 6\' 2"  (1.88 m)   Body mass index is 25.91 kg/m. Gen: NAD, conversant, well nourised, obese, well groomed                     Cardiovascular: Regular rate rhythm, no peripheral edema, warm, nontender. Eyes: Conjunctivae clear without exudates or hemorrhage Neck: Supple, no carotid bruise. Pulmonary: Clear to auscultation bilaterally   NEUROLOGICAL EXAM:  MENTAL  STATUS: Speech:    Speech is normal; fluent and spontaneous with normal comprehension.  Cognition: Mini-Mental Status Examination is 29 out of 30     Orientation to time, place and person.Missed his doctor     Recent and remote memory:      Normal Attention span and concentration,      Normal Language, naming, repeating,spontaneous speech     Fund of knowledge   CRANIAL NERVES: CN II: Visual fields are full to confrontation. Fundoscopic exam is normal with sharp discs and no vascular changes. Pupils are round equal and briskly reactive to light. CN III, IV, VI: extraocular movement are normal. No ptosis. CN V: Facial sensation is intact to pinprick in all 3 divisions bilaterally. Corneal responses are intact.  CN VII: Face is symmetric with normal eye closure and smile. CN VIII: Hearing is normal to rubbing fingers CN IX, X: Palate elevates symmetrically. Phonation is normal. CN XI: Head turning and shoulder shrug are intact CN XII: Tongue is midline with normal movements and no atrophy.  MOTOR:There is no pronator drift of out-stretched arms. Muscle bulk and tone are normal. Muscle strength is normal, with exception of mild bilateral toe flexion extension weakness. REFLEXES:He was diffusely areflexia, Plantar responses are flexor. SENSORY:Length dependent decreased to light touch, pinprick, vibratory sensation at ankle level COORDINATION:Rapid alternating movements and fine finger movements are intact. There is no dysmetria on finger-to-nose and heel-knee-shin.   GAIT/STANCE:Posture is normal. Gait is steady with normal steps, base, arm swing, and turning. He is not able to walk on tiptoe or heels   DIAGNOSTIC DATA (LABS, IMAGING, TESTING) - I reviewed patient records, labs, notes, testing and imaging myself where available.  Lab Results  Component Value Date   WBC 6.6 05/03/2015   HGB 10.0 (L) 05/03/2015   HCT 30.3 (L) 05/03/2015   MCV 89.9 05/03/2015   PLT 128 (L)  05/03/2015      Component Value Date/Time   NA 138 05/03/2015 0420   NA 128 (L) 01/30/2015 0809   K 4.0 05/03/2015 0420   CL 103 05/03/2015 0420   CO2 25 05/03/2015 0420   GLUCOSE 105 (H) 05/03/2015 0420   BUN 14 05/03/2015 0420   BUN 18 01/30/2015 0809   CREATININE 0.95 05/03/2015 0420   CALCIUM 9.0 05/03/2015 0420   PROT 6.1 (L) 05/03/2015 0420   PROT 6.3 01/30/2015 0809   ALBUMIN 3.6 05/03/2015 0420   ALBUMIN 4.2 01/30/2015 0809   AST 18 05/03/2015 0420   ALT 15 (L) 05/03/2015 0420   ALKPHOS 57 05/03/2015 0420   BILITOT 0.9 05/03/2015 0420   BILITOT 0.4 01/30/2015 0809   GFRNONAA >  60 05/03/2015 0420   GFRAA >60 05/03/2015 0420    Lab Results  Component Value Date   VITAMINB12 229 01/30/2015   Lab Results  Component Value Date   TSH 2.080 01/30/2015      ASSESSMENT AND PLAN  75 y.o. year old male  has a past medical history of Neck pain; and Neuropathy ([place]).And depression here to follow-up. He has tried Lyrica which made him dizzy, and gabapentin has been stopped because it did not help his symptoms. Mild cognitive impairment Mini-Mental Status exam 29 of 30. He has a long history of depression and is the main caregiver for his wife.The patient is a current patient of Dr. Krista Blue  who is out of the office today . This note is sent to the work in doctor.Records reviewed     Trileptal 150 mg twice a day restart this medication Discussed the reasoning for this medication and his neuropathy Nortriptyline 25 mg 2 tabs at at bedtime Continue metanyx Follow-up in 6 months Dennie Bible, [facility], [facility], APRN  [facility] [address], [place],  [address] [phone]   Personally  participated in and made any corrections needed to history, physical, neuro exam,assessment and plan as stated above.   Sarina Ill, MD [facility]

## 2015-10-03 ENCOUNTER — Other Ambulatory Visit: Payer: Self-pay | Admitting: Family Medicine

## 2015-10-03 DIAGNOSIS — I739 Peripheral vascular disease, unspecified: Secondary | ICD-10-CM

## 2015-10-10 ENCOUNTER — Ambulatory Visit
Admission: RE | Admit: 2015-10-10 | Discharge: 2015-10-10 | Disposition: A | Discharge status: Home / Self Care | Payer: Medicare Other | Source: Ambulatory Visit | Attending: Family Medicine | Admitting: Family Medicine

## 2015-10-10 DIAGNOSIS — I739 Peripheral vascular disease, unspecified: Secondary | ICD-10-CM

## 2015-10-12 DIAGNOSIS — F321 Major depressive disorder, single episode, moderate: Secondary | ICD-10-CM | POA: Diagnosis not present

## 2015-10-19 DIAGNOSIS — M170 Bilateral primary osteoarthritis of knee: Secondary | ICD-10-CM | POA: Diagnosis not present

## 2015-11-02 ENCOUNTER — Encounter: Payer: Self-pay | Admitting: *Deleted

## 2015-11-02 ENCOUNTER — Other Ambulatory Visit: Payer: Self-pay | Admitting: *Deleted

## 2015-11-02 MED ORDER — METANX 3-90.314-2-35 MG PO CAPS: ORAL_CAPSULE | ORAL | 3 refills | Status: DC

## 2015-11-09 DIAGNOSIS — M5417 Radiculopathy, lumbosacral region: Secondary | ICD-10-CM | POA: Diagnosis not present

## 2015-11-29 DIAGNOSIS — Z23 Encounter for immunization: Secondary | ICD-10-CM | POA: Diagnosis not present

## 2015-12-07 DIAGNOSIS — M47816 Spondylosis without myelopathy or radiculopathy, lumbar region: Secondary | ICD-10-CM | POA: Diagnosis not present

## 2015-12-07 DIAGNOSIS — M47817 Spondylosis without myelopathy or radiculopathy, lumbosacral region: Secondary | ICD-10-CM | POA: Diagnosis not present

## 2015-12-07 DIAGNOSIS — M5417 Radiculopathy, lumbosacral region: Secondary | ICD-10-CM | POA: Diagnosis not present

## 2015-12-07 DIAGNOSIS — M4802 Spinal stenosis, cervical region: Secondary | ICD-10-CM | POA: Diagnosis not present

## 2015-12-07 DIAGNOSIS — Z6825 Body mass index (BMI) 25.0-25.9, adult: Secondary | ICD-10-CM | POA: Diagnosis not present

## 2015-12-19 DIAGNOSIS — L509 Urticaria, unspecified: Secondary | ICD-10-CM | POA: Diagnosis not present

## 2015-12-19 DIAGNOSIS — H6122 Impacted cerumen, left ear: Secondary | ICD-10-CM | POA: Diagnosis not present

## 2015-12-28 DIAGNOSIS — S9002XA Contusion of left ankle, initial encounter: Secondary | ICD-10-CM | POA: Diagnosis not present

## 2015-12-28 DIAGNOSIS — S9001XA Contusion of right ankle, initial encounter: Secondary | ICD-10-CM | POA: Diagnosis not present

## 2016-01-15 DIAGNOSIS — F321 Major depressive disorder, single episode, moderate: Secondary | ICD-10-CM | POA: Diagnosis not present

## 2016-01-22 ENCOUNTER — Observation Stay (HOSPITAL_COMMUNITY): Payer: Medicare Other

## 2016-01-22 ENCOUNTER — Other Ambulatory Visit: Payer: Self-pay

## 2016-01-22 ENCOUNTER — Encounter (HOSPITAL_COMMUNITY): Payer: Self-pay

## 2016-01-22 ENCOUNTER — Inpatient Hospital Stay (HOSPITAL_COMMUNITY)
Admission: EM | Admit: 2016-01-22 | Discharge: 2016-01-25 | DRG: 641 | Disposition: A | Discharge status: Home Health | Payer: Medicare Other | Attending: Internal Medicine | Admitting: Internal Medicine

## 2016-01-22 DIAGNOSIS — R413 Other amnesia: Secondary | ICD-10-CM | POA: Diagnosis present

## 2016-01-22 DIAGNOSIS — R2689 Other abnormalities of gait and mobility: Secondary | ICD-10-CM | POA: Diagnosis not present

## 2016-01-22 DIAGNOSIS — N400 Enlarged prostate without lower urinary tract symptoms: Secondary | ICD-10-CM | POA: Diagnosis present

## 2016-01-22 DIAGNOSIS — R278 Other lack of coordination: Secondary | ICD-10-CM | POA: Diagnosis present

## 2016-01-22 DIAGNOSIS — L509 Urticaria, unspecified: Secondary | ICD-10-CM | POA: Diagnosis not present

## 2016-01-22 DIAGNOSIS — Z66 Do not resuscitate: Secondary | ICD-10-CM | POA: Diagnosis present

## 2016-01-22 DIAGNOSIS — E860 Dehydration: Secondary | ICD-10-CM | POA: Diagnosis not present

## 2016-01-22 DIAGNOSIS — G629 Polyneuropathy, unspecified: Secondary | ICD-10-CM | POA: Diagnosis present

## 2016-01-22 DIAGNOSIS — G608 Other hereditary and idiopathic neuropathies: Secondary | ICD-10-CM

## 2016-01-22 DIAGNOSIS — R339 Retention of urine, unspecified: Secondary | ICD-10-CM | POA: Diagnosis not present

## 2016-01-22 DIAGNOSIS — G6289 Other specified polyneuropathies: Secondary | ICD-10-CM | POA: Diagnosis present

## 2016-01-22 DIAGNOSIS — R270 Ataxia, unspecified: Secondary | ICD-10-CM

## 2016-01-22 DIAGNOSIS — R627 Adult failure to thrive: Secondary | ICD-10-CM | POA: Diagnosis present

## 2016-01-22 DIAGNOSIS — R2681 Unsteadiness on feet: Secondary | ICD-10-CM

## 2016-01-22 DIAGNOSIS — F0390 Unspecified dementia without behavioral disturbance: Secondary | ICD-10-CM | POA: Diagnosis not present

## 2016-01-22 DIAGNOSIS — R4781 Slurred speech: Secondary | ICD-10-CM | POA: Diagnosis not present

## 2016-01-22 DIAGNOSIS — R251 Tremor, unspecified: Secondary | ICD-10-CM | POA: Diagnosis not present

## 2016-01-22 DIAGNOSIS — E871 Hypo-osmolality and hyponatremia: Secondary | ICD-10-CM | POA: Diagnosis not present

## 2016-01-22 DIAGNOSIS — T7840XA Allergy, unspecified, initial encounter: Secondary | ICD-10-CM | POA: Diagnosis not present

## 2016-01-22 DIAGNOSIS — Z79891 Long term (current) use of opiate analgesic: Secondary | ICD-10-CM

## 2016-01-22 DIAGNOSIS — R2990 Unspecified symptoms and signs involving the nervous system: Secondary | ICD-10-CM | POA: Insufficient documentation

## 2016-01-22 DIAGNOSIS — K219 Gastro-esophageal reflux disease without esophagitis: Secondary | ICD-10-CM | POA: Diagnosis present

## 2016-01-22 DIAGNOSIS — F419 Anxiety disorder, unspecified: Secondary | ICD-10-CM | POA: Diagnosis present

## 2016-01-22 DIAGNOSIS — R42 Dizziness and giddiness: Secondary | ICD-10-CM | POA: Diagnosis not present

## 2016-01-22 DIAGNOSIS — K210 Gastro-esophageal reflux disease with esophagitis: Secondary | ICD-10-CM | POA: Diagnosis not present

## 2016-01-22 DIAGNOSIS — T421X5A Adverse effect of iminostilbenes, initial encounter: Secondary | ICD-10-CM | POA: Diagnosis present

## 2016-01-22 DIAGNOSIS — M542 Cervicalgia: Secondary | ICD-10-CM | POA: Diagnosis present

## 2016-01-22 DIAGNOSIS — R338 Other retention of urine: Secondary | ICD-10-CM | POA: Diagnosis present

## 2016-01-22 DIAGNOSIS — G4700 Insomnia, unspecified: Secondary | ICD-10-CM | POA: Diagnosis present

## 2016-01-22 DIAGNOSIS — Z79899 Other long term (current) drug therapy: Secondary | ICD-10-CM

## 2016-01-22 DIAGNOSIS — R21 Rash and other nonspecific skin eruption: Secondary | ICD-10-CM | POA: Diagnosis present

## 2016-01-22 DIAGNOSIS — Z87891 Personal history of nicotine dependence: Secondary | ICD-10-CM

## 2016-01-22 DIAGNOSIS — Z981 Arthrodesis status: Secondary | ICD-10-CM

## 2016-01-22 DIAGNOSIS — R262 Difficulty in walking, not elsewhere classified: Secondary | ICD-10-CM | POA: Diagnosis present

## 2016-01-22 DIAGNOSIS — N401 Enlarged prostate with lower urinary tract symptoms: Secondary | ICD-10-CM | POA: Diagnosis not present

## 2016-01-22 DIAGNOSIS — F32A Depression, unspecified: Secondary | ICD-10-CM | POA: Diagnosis present

## 2016-01-22 DIAGNOSIS — F329 Major depressive disorder, single episode, unspecified: Secondary | ICD-10-CM | POA: Diagnosis present

## 2016-01-22 LAB — CBC
HCT: 29.2 % — ABNORMAL LOW (ref 39.0–52.0)
Hemoglobin: 10.2 g/dL — ABNORMAL LOW (ref 13.0–17.0)
MCH: 31.5 pg (ref 26.0–34.0)
MCHC: 34.9 g/dL (ref 30.0–36.0)
MCV: 90.1 fL (ref 78.0–100.0)
Platelets: 166 10*3/uL (ref 150–400)
RBC: 3.24 MIL/uL — ABNORMAL LOW (ref 4.22–5.81)
RDW: 13.8 % (ref 11.5–15.5)
WBC: 5.3 10*3/uL (ref 4.0–10.5)

## 2016-01-22 LAB — URINALYSIS, ROUTINE W REFLEX MICROSCOPIC
Bilirubin Urine: NEGATIVE
Glucose, UA: NEGATIVE mg/dL
Hgb urine dipstick: NEGATIVE
Ketones, ur: NEGATIVE mg/dL
Leukocytes, UA: NEGATIVE
Nitrite: NEGATIVE
Protein, ur: NEGATIVE mg/dL
Specific Gravity, Urine: 1.014 (ref 1.005–1.030)
pH: 6.5 (ref 5.0–8.0)

## 2016-01-22 LAB — COMPREHENSIVE METABOLIC PANEL
ALT: 15 U/L — ABNORMAL LOW (ref 17–63)
AST: 24 U/L (ref 15–41)
Albumin: 4.3 g/dL (ref 3.5–5.0)
Alkaline Phosphatase: 80 U/L (ref 38–126)
Anion gap: 8 (ref 5–15)
BUN: 9 mg/dL (ref 6–20)
CO2: 26 mmol/L (ref 22–32)
Calcium: 9.2 mg/dL (ref 8.9–10.3)
Chloride: 91 mmol/L — ABNORMAL LOW (ref 101–111)
Creatinine, Ser: 1.06 mg/dL (ref 0.61–1.24)
GFR calc Af Amer: >60 mL/min (ref >60)
GFR calc non Af Amer: >60 mL/min (ref >60)
Glucose, Bld: 108 mg/dL — ABNORMAL HIGH (ref 65–99)
Potassium: 4.2 mmol/L (ref 3.5–5.1)
Sodium: 125 mmol/L — ABNORMAL LOW (ref 135–145)
Total Bilirubin: 0.7 mg/dL (ref 0.3–1.2)
Total Protein: 6.9 g/dL (ref 6.5–8.1)

## 2016-01-22 LAB — SODIUM: Sodium: 126 mmol/L — ABNORMAL LOW (ref 135–145)

## 2016-01-22 LAB — OSMOLALITY: Osmolality: 260 mOsm/kg — ABNORMAL LOW (ref 275–295)

## 2016-01-22 LAB — APTT: aPTT: 37 seconds — ABNORMAL HIGH (ref 24–36)

## 2016-01-22 LAB — PROTIME-INR
INR: 1.07
INR: 1.08
Prothrombin Time: 14 seconds (ref 11.4–15.2)
Prothrombin Time: 14 seconds (ref 11.4–15.2)

## 2016-01-22 LAB — TROPONIN I
Troponin I: <0.03 ng/mL (ref <0.03)
Troponin I: <0.03 ng/mL (ref <0.03)

## 2016-01-22 LAB — RAPID URINE DRUG SCREEN, HOSP PERFORMED
Amphetamines: NOT DETECTED
Barbiturates: NOT DETECTED
Benzodiazepines: POSITIVE — AB
Cocaine: NOT DETECTED
Opiates: POSITIVE — AB
Tetrahydrocannabinol: NOT DETECTED

## 2016-01-22 LAB — OSMOLALITY, URINE: Osmolality, Ur: 373 mOsm/kg (ref 300–900)

## 2016-01-22 LAB — CORTISOL-AM, BLOOD: Cortisol - AM: 17.2 ug/dL (ref 6.7–22.6)

## 2016-01-22 LAB — NA AND K (SODIUM & POTASSIUM), RAND UR
Potassium Urine: 41 mmol/L
Sodium, Ur: 57 mmol/L

## 2016-01-22 LAB — TSH: TSH: 0.929 u[IU]/mL (ref 0.350–4.500)

## 2016-01-22 MED ORDER — SENNOSIDES-DOCUSATE SODIUM 8.6-50 MG PO TABS
1.0000 | ORAL_TABLET | Freq: Every evening | ORAL | Status: DC | PRN
  Administered 2016-01-23: 1 via ORAL

## 2016-01-22 MED ORDER — ARIPIPRAZOLE 2 MG PO TABS
2.0000 mg | ORAL_TABLET | Freq: Every day | ORAL | Status: DC
  Administered 2016-01-23: 2 mg via ORAL
  Filled 2016-01-22: qty 1

## 2016-01-22 MED ORDER — SODIUM CHLORIDE 0.9 % IV BOLUS (SEPSIS)
1000.0000 mL | Freq: Once | INTRAVENOUS | Status: AC
Start: 2016-01-22 — End: 2016-01-22
  Administered 2016-01-22: 1000 mL via INTRAVENOUS

## 2016-01-22 MED ORDER — SODIUM CHLORIDE 0.9 % IV SOLN
INTRAVENOUS | Status: DC
  Administered 2016-01-22: 1000 mL via INTRAVENOUS
  Administered 2016-01-23: 12:00:00 via INTRAVENOUS

## 2016-01-22 MED ORDER — STROKE: EARLY STAGES OF RECOVERY BOOK
Freq: Once | Status: AC
  Administered 2016-01-23: 1
  Filled 2016-01-22: qty 1

## 2016-01-22 MED ORDER — OXYCODONE-ACETAMINOPHEN 10-325 MG PO TABS
1.0000 | ORAL_TABLET | Freq: Four times a day (QID) | ORAL | Status: DC | PRN
Start: 2016-01-22 — End: 2016-01-22

## 2016-01-22 MED ORDER — QUETIAPINE FUMARATE 200 MG PO TABS
600.0000 mg | ORAL_TABLET | Freq: Every day | ORAL | Status: DC
  Administered 2016-01-22 – 2016-01-24 (×3): 600 mg via ORAL
  Filled 2016-01-22 (×3): qty 3

## 2016-01-22 MED ORDER — ASPIRIN 325 MG PO TABS
325.0000 mg | ORAL_TABLET | Freq: Every day | ORAL | Status: DC
  Administered 2016-01-23 – 2016-01-25 (×3): 325 mg via ORAL
  Filled 2016-01-22 (×3): qty 1

## 2016-01-22 MED ORDER — ACETAMINOPHEN 650 MG RE SUPP: 650.0000 mg | RECTAL | Status: DC | PRN

## 2016-01-22 MED ORDER — VITAMIN D 1000 UNITS PO TABS
1000.0000 [IU] | ORAL_TABLET | Freq: Every day | ORAL | Status: DC
  Administered 2016-01-23 – 2016-01-25 (×3): 1000 [IU] via ORAL
  Filled 2016-01-22 (×3): qty 1

## 2016-01-22 MED ORDER — BUPROPION HCL ER (XL) 150 MG PO TB24
300.0000 mg | ORAL_TABLET | Freq: Every day | ORAL | Status: DC
  Administered 2016-01-23: 300 mg via ORAL
  Filled 2016-01-22: qty 2

## 2016-01-22 MED ORDER — LAMOTRIGINE 100 MG PO TABS
100.0000 mg | ORAL_TABLET | Freq: Every day | ORAL | Status: DC
  Administered 2016-01-22 – 2016-01-23 (×2): 100 mg via ORAL
  Filled 2016-01-22 (×2): qty 1

## 2016-01-22 MED ORDER — OXCARBAZEPINE 150 MG PO TABS
150.0000 mg | ORAL_TABLET | Freq: Two times a day (BID) | ORAL | Status: DC
  Administered 2016-01-22 – 2016-01-23 (×2): 150 mg via ORAL
  Filled 2016-01-22 (×3): qty 1

## 2016-01-22 MED ORDER — OXYCODONE-ACETAMINOPHEN 5-325 MG PO TABS: 1.0000 | ORAL_TABLET | Freq: Four times a day (QID) | ORAL | Status: DC | PRN

## 2016-01-22 MED ORDER — VILAZODONE HCL 20 MG PO TABS: 20.0000 mg | ORAL_TABLET | Freq: Every day | ORAL | Status: DC

## 2016-01-22 MED ORDER — FINASTERIDE 5 MG PO TABS
5.0000 mg | ORAL_TABLET | Freq: Every day | ORAL | Status: DC
  Administered 2016-01-23 – 2016-01-25 (×3): 5 mg via ORAL
  Filled 2016-01-22 (×3): qty 1

## 2016-01-22 MED ORDER — ASPIRIN 300 MG RE SUPP: 300.0000 mg | Freq: Every day | RECTAL | Status: DC

## 2016-01-22 MED ORDER — VITAMIN B-12 1000 MCG PO TABS
1000.0000 ug | ORAL_TABLET | Freq: Every day | ORAL | Status: DC
  Administered 2016-01-23 – 2016-01-25 (×3): 1000 ug via ORAL
  Filled 2016-01-22 (×3): qty 1

## 2016-01-22 MED ORDER — OXYCODONE HCL 5 MG PO TABS
5.0000 mg | ORAL_TABLET | Freq: Four times a day (QID) | ORAL | Status: DC | PRN
  Administered 2016-01-22 – 2016-01-24 (×3): 5 mg via ORAL
  Filled 2016-01-22 (×3): qty 1

## 2016-01-22 MED ORDER — TEMAZEPAM 7.5 MG PO CAPS
15.0000 mg | ORAL_CAPSULE | Freq: Every day | ORAL | Status: DC
  Administered 2016-01-22: 15 mg via ORAL
  Filled 2016-01-22: qty 2

## 2016-01-22 MED ORDER — NORTRIPTYLINE HCL 25 MG PO CAPS
50.0000 mg | ORAL_CAPSULE | Freq: Every day | ORAL | Status: DC
  Administered 2016-01-22 – 2016-01-24 (×3): 50 mg via ORAL
  Filled 2016-01-22 (×3): qty 2

## 2016-01-22 MED ORDER — PANTOPRAZOLE SODIUM 40 MG PO TBEC
40.0000 mg | DELAYED_RELEASE_TABLET | Freq: Every day | ORAL | Status: DC
  Administered 2016-01-23 – 2016-01-25 (×3): 40 mg via ORAL
  Filled 2016-01-22 (×3): qty 1

## 2016-01-22 MED ORDER — ACETAMINOPHEN 325 MG PO TABS: 650.0000 mg | ORAL_TABLET | ORAL | Status: DC | PRN

## 2016-01-22 MED ORDER — POLYETHYLENE GLYCOL 3350 17 G PO PACK: 17.0000 g | PACK | Freq: Every day | ORAL | Status: DC | PRN

## 2016-01-22 MED ORDER — OXYCODONE HCL ER 10 MG PO T12A
20.0000 mg | EXTENDED_RELEASE_TABLET | Freq: Two times a day (BID) | ORAL | Status: DC
  Administered 2016-01-22 – 2016-01-25 (×6): 20 mg via ORAL
  Filled 2016-01-22 (×6): qty 2

## 2016-01-22 NOTE — Progress Notes (Signed)
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

## 2016-01-22 NOTE — ED Notes (Addendum)
Assisted pt to side of the bed to urinate and pt stated that he couldn't urinate. Notified Arboriculturist)

## 2016-01-22 NOTE — ED Notes (Signed)
Patient aware of need of urine specimen, Erin, RN handed patient an urinal to attempt to provide an urine sample; visitors at bedside

## 2016-01-22 NOTE — ED Notes (Signed)
Pt. Transported to MRI 

## 2016-01-22 NOTE — Progress Notes (Signed)
Pt arrived around 2100 from ED alert and oriented with some gait disturbance and ataxia in lower extremities, family with patient and they informed nurse that he lost his brother about a week ago, so he is depressed. MRI was negative for stroke will continue to monitor.

## 2016-01-22 NOTE — ED Provider Notes (Signed)
[facility] DEPT Provider Note   CSN: [id number] Arrival date & time: 01/22/16  1206     History   Chief Complaint Chief Complaint  Patient presents with  . Gait Problem  . Rash    HPI Dustin [name] is a 75 y.o. male.  The history is provided by the patient and medical records.  Rash      75 y.o. M with hx of BPH, GERD, insomnia, neck pain, neuropathy, presenting to the ED for gait problem and slurred speech.  Patient reports yesterday while walking around his house around 11am he began feeling dizzy.  States it feels like he is off balance, denies room spinning sensation, nausea, or emesis.  States when he tries to walk he does seem to stagger a bit.  Symptoms are worse with movement, better with sitting still.  States generally he walks around unassisted without cane or walker and is able to go up and down stairs independently without difficulty.  Patient does have baseline tremors, states has been ongoing for a while now.  States he developed a rash (hives) yesterday as well and did take 3 benadryl which made him very sleepy.  Son who is present at bedside states he called to check on him this morning and ended up going to his house to check on him and felt he should be evaluated.  Per family, patient's speech still seems somewhat slurred from his baseline.  There have been no recent changes in medications other than the dose of benadryl yesterday.  No falls or head trauma reported.  He is not on anti-coagulation currently.  No hx of TIA or stroke.  Patient's brother did recently have a stroke.  Denies numbness, weakness, blurred vision, confusion, headache, neck pain, fever, chills, sweats, or recent illness.  No chest pain or abdominal pain.  Patient does follow with neurology, Dr. Krista Blue.  Past Medical History:  Diagnosis Date  . BPH (benign prostatic hyperplasia)   . GERD (gastroesophageal reflux disease)    protonix for control  . Insomnia   . Neck pain   . Neuropathy [facility])      Patient Active Problem List   Diagnosis Date Noted  . Syncope 05/02/2015  . Closed left fibular fracture 05/02/2015  . GERD (gastroesophageal reflux disease) 05/02/2015  . BPH (benign prostatic hyperplasia) 05/02/2015  . Fracture of distal end of left fibula   . 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 Depression 01/30/2015  . 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 ANTERIOR CERVICAL DECOMPRESSION/DISCECTOMY FUSION 4 LEVELS N/A 10/27/2012   Procedure: Cervical Three-Four Cervical Four-Five Cervical Five-Six Cervical Six-Seven  Anterior cervical decompression/diskectomy/fusion;  Surgeon: Erline Levine, MD;  Location: [facility];  Service: Neurosurgery;  Laterality: N/A;  Cervical Three-Four Cervical Four-Five Cervical Five-Six Cervical Six-Seven  Anterior cervical decompression/diskectomy/fusion  . SHOULDER ARTHROSCOPY  2010   rt shoulder  . SHOULDER ARTHROSCOPY  02/15/2011   Procedure: ARTHROSCOPY SHOULDER;  Surgeon: Cynda Familia;  Location: [facility];  Service: Orthopedics;  Laterality: Right;  Debridement  . TONSILLECTOMY         Home Medications    Prior to Admission medications   Medication Sig Start Date End Date Taking? Authorizing Provider  ARIPiprazole (ABILIFY) 2 MG tablet Take 2 mg by mouth daily.    Historical Provider, MD  cholecalciferol (VITAMIN D) 1000 UNITS tablet Take 1,000 Units by mouth daily.     Historical Provider, MD  finasteride (  PROSCAR) 5 MG tablet Take 5 mg by mouth daily.      Historical Provider, MD  L-Methylfolate-Algae-B12-B6 Glade Stanford) 3-90.314-2-35 MG CAPS Take one capsule daily. 11/02/15   Marcial Pacas, MD  nortriptyline (PAMELOR) 25 MG capsule Take 2 capsules (50 mg total) by mouth at bedtime. 09/25/15   Dennie Bible, NP  OXcarbazepine (TRILEPTAL) 150 MG tablet Take 1 tablet (150 mg total) by mouth 2 (two) times daily. 09/25/15   Dennie Bible, NP    oxyCODONE-acetaminophen (PERCOCET) 10-325 MG tablet Take 1 tablet by mouth every 6 (six) hours as needed for pain. 05/03/15   Ripudeep K Rai, MD  OXYCONTIN 20 MG T12A 12 hr tablet TAKE 1 TABLET BY ORAL ROUTE EVERY 12 HOURS (DNF 09/24/14) 09/27/14   Historical Provider, MD  pantoprazole (PROTONIX) 40 MG tablet Take 40 mg by mouth daily.      Historical Provider, MD  polyethylene glycol (MIRALAX / GLYCOLAX) packet Take 17 g by mouth daily as needed for mild constipation. 05/03/15   Ripudeep Krystal Eaton, MD  QUEtiapine (SEROQUEL) 300 MG tablet Take 600 mg by mouth at bedtime. Reported on 05/10/2015    Historical Provider, MD  temazepam (RESTORIL) 15 MG capsule Take 15 mg by mouth at bedtime.    Historical Provider, MD  Vilazodone HCl (VIIBRYD) 20 MG TABS Take 20 mg by mouth daily.    Historical Provider, MD  vitamin B-12 (CYANOCOBALAMIN) 1000 MCG tablet Take 1,000 mcg by mouth daily.    Historical Provider, MD    Family History History reviewed. No pertinent family history.  Social History Social History  Substance Use Topics  . Smoking status: Former Smoker    Packs/day: 1.50    Quit date: 01/27/1990  . Smokeless tobacco: Never Used  . Alcohol use No     Allergies   Patient has no known allergies.   Review of Systems Review of Systems  Musculoskeletal: Positive for gait problem.  Skin: Positive for rash.  All other systems reviewed and are negative.    Physical Exam Updated Vital Signs BP 150/73   Pulse 85   Resp 15   Ht 6\' 2"  (1.88 m)   Wt 86.2 kg   SpO2 99%   BMI 24.39 kg/m   Physical Exam  Constitutional: He is oriented to person, place, and time. He appears well-developed and well-nourished.  HENT:  Head: Normocephalic and atraumatic.  Mouth/Throat: Oropharynx is clear and moist.  Eyes: Conjunctivae and EOM are normal. Pupils are equal, round, and reactive to light.  Pupils symmetric and reactive bilaterally, eyes crossing midline, no nystagmus, normal confrontation, no  field cuts  Neck: Trachea normal, normal range of motion and full passive range of motion without pain. Neck supple. No neck rigidity.  Full ROM, no rigidity  Cardiovascular: Normal rate, regular rhythm and normal heart sounds.   Pulmonary/Chest: Effort normal and breath sounds normal. No respiratory distress. He has no wheezes.  Abdominal: Soft. Bowel sounds are normal.  Musculoskeletal: Normal range of motion.  Neurological: He is alert and oriented to person, place, and time.  AAOx3, answering questions and following commands appropriately; equal strength UE and LE bilaterally; some tremors noted to all 4 extremities while holding extremities up (patient states this has been ongoing for a while); some dysmetria with finger to nose bilaterally, slightly worse on right; normal sensation throughout, no apparent truncal ataxia; some repetitive questioning during exam (hx of memory loss); symmetric smile and forehead wrinkle; speech is clear and goal oriented  Skin: Skin is warm and dry. No lesion, no petechiae and no rash noted.  No apparent rash noted, some chronic skin changes noted to the left forearm  Psychiatric: He has a normal mood and affect.  Nursing note and vitals reviewed.    ED Treatments / Results  Labs (all labs ordered are listed, but only abnormal results are displayed) Labs Reviewed  COMPREHENSIVE METABOLIC PANEL - Abnormal; Notable for the following:       Result Value   Sodium 125 (*)    Chloride 91 (*)    Glucose, Bld 108 (*)    ALT 15 (*)    All other components within normal limits  CBC - Abnormal; Notable for the following:    RBC 3.24 (*)    Hemoglobin 10.2 (*)    HCT 29.2 (*)    All other components within normal limits  PROTIME-INR  TROPONIN I  URINALYSIS, ROUTINE W REFLEX MICROSCOPIC (NOT AT [facility])  NA AND K (SODIUM & POTASSIUM), RAND UR    EKG  EKG Interpretation None       Radiology No results found.  Procedures Procedures (including  critical care time)  Medications Ordered in ED Medications - No data to display   Initial Impression / Assessment and Plan / ED Course  I have reviewed the triage vital signs and the nursing notes.  Pertinent labs & imaging results that were available during my care of the patient were reviewed by me and considered in my medical decision making (see chart for details).  Clinical Course    75 year old male here with gait disturbance. Reports he was feeling dizzy yesterday around 11 AM and developed a urticarial rash soon after which resolved after 75 mg of Benadryl. States he felt very sleepy after the Benadryl. Today, his son called to check on him and noticed that he seemed somewhat "off". Patient reports he feels very off balance when trying to walk. Denies any room spinning sensation, nausea, vomiting. He has no history of TIA or stroke. On exam, patient is tremulous in all 4 of his extremities which he reports has been going on for "a while". He has no focal weakness or sensory deficit. There is dysmetria bilaterally, however I suspect this may be due to his tremors. His speech seems clear, fluid, and goal oriented to me, however family has some concerns about this.  Patient did have some repetitive questioning on exam, however based on chart review and prior OP neurology notes, he has history of memory loss.  He is followed by neurology.  Patient with somewhat atypical presentation and exam findings for stroke or BPPV. Patient is on multiple psychiatric medications as well as opiates for chronic pain so polypharmacy may be playing a role as well.  Labs with hyponatremia at 125, hx of same in the past without noted intervention.  Trop negative.  Plan for MRI, CT head ordered for now due to delay in imaging.  IVF given.  3:10 PM Spoke with hospitalist service-- will admit for tele obs.  They are aware imaging studies pending at this time due to wait time.  They have requested neurology  consultation-- spoke with Dr. Leonel Ramsay, who will provide consultation.  Case discussed with attending physician, Dr. Laneta Simmers, who evaluated patient and agrees with assessment and plan of care.   Final Clinical Impressions(s) / ED Diagnoses   Final diagnoses:  Acute hyponatremia  Tremulousness    New Prescriptions New Prescriptions   No medications on file  Larene Pickett, PA-C 01/22/16 1547    Leo Grosser, MD 01/22/16 [phone]

## 2016-01-22 NOTE — ED Notes (Signed)
Pt back from MRI. Neuro at bed side assessing pt

## 2016-01-22 NOTE — ED Notes (Addendum)
Pt. Concerned he is withdrawing from pain medication. Pt. C/o tremors. Pt. Denies any pain. EDP made aware.

## 2016-01-22 NOTE — H&P (Signed)
History and Physical    Dustin [name] [id number] DOB: 1940/10/25 DOA: 01/22/2016   PCP: Dustin Amel, MD   Patient coming from:  Home   Chief Complaint:  HPI: Dustin [name] is a 75 y.o. male  with neuropathy, insomnia, chronic neck pain, BPH, presenting to the emergency department for gait instability and slurred speech. The symptoms began last night, he describes it as feeling " wobbly". Status was complicated by some dizziness around 11 AM for which the family regarding to the hospital. He feels he is off balance, but denies room spinning sensation of vertigo, he also denies any nausea or emesis.He does reports some urine retention, has not been able to urinate over the last 4 hrs. He feels better when sitting still. He usually walks unassisted but he has not been able to do so. The symptoms were preceded by a rash in his left arm, for which he 2 Benadryl 75 mg and resolved. THis dose increased his lethargy Family reports that he also was noted to have some slurred speech. There have been no recent changes in his medications, other than the dose of Benadryl. Of note, he takes a myriad of neuro meds, no apparent recent revision of these medications. He denies any falls or head trauma. He is not an anticoagulation. He denies any history of TIA or stroke. His brother had a stroke. He denies any numbness, weakness, blurred vision,  headache, neck pain, fever chills night sweats or recent illness. No dysphagia He denies any tick bite. He denies any recent travels  He denies any hormonal therapy.   ED Course:  BP 133/81   Pulse 88   Resp 18   Ht 6' 2" (1.88 m)   Wt 86.2 kg (190 lb)   SpO2 100%   BMI 24.39 kg/m    remarkable for Sodium  125 potassium 4.2 BUN 9  creatinine 1.06 at baseline glucose 108 Hb 10.2  WBC 5.3, plts 166  AST 24 ALT 15 total protein 6.9 albumin 0.7  troponin less than 0.03 EKG without ACS    Review of Systems: As per HPI otherwise 10 point review of  systems negative.   Past Medical History:  Diagnosis Date  . BPH (benign prostatic hyperplasia)   . GERD (gastroesophageal reflux disease)    protonix for control  . Insomnia   . Neck pain   . Neuropathy [facility])     Past Surgical History:  Procedure Laterality Date  . ANTERIOR CERVICAL DECOMPRESSION/DISCECTOMY FUSION 4 LEVELS N/A 10/27/2012   Procedure: Cervical Three-Four Cervical Four-Five Cervical Five-Six Cervical Six-Seven  Anterior cervical decompression/diskectomy/fusion;  Surgeon: Erline Levine, MD;  Location: [facility];  Service: Neurosurgery;  Laterality: N/A;  Cervical Three-Four Cervical Four-Five Cervical Five-Six Cervical Six-Seven  Anterior cervical decompression/diskectomy/fusion  . SHOULDER ARTHROSCOPY  2010   rt shoulder  . SHOULDER ARTHROSCOPY  02/15/2011   Procedure: ARTHROSCOPY SHOULDER;  Surgeon: Cynda Familia;  Location: [facility];  Service: Orthopedics;  Laterality: Right;  Debridement  . TONSILLECTOMY      Social History Social History   Social History  . Marital status: Married    Spouse name: Dustin [name]  . Number of children: 2  . Years of education: college   Occupational History  . retired    Social History Main Topics  . Smoking status: Former Smoker    Packs/day: 1.50    Quit date: 01/27/1990  . Smokeless tobacco: Never Used  . Alcohol use No  . Drug  use: No  . Sexual activity: Not on file   Other Topics Concern  . Not on file   Social History Narrative   Pt lives home with wife Dustin [name])   Pt is retired   Education college    Pt is left handed   Pt consumes 2 cups of coffee daily     No Known Allergies  History reviewed. No pertinent family history.    Prior to Admission medications   Medication Sig Start Date End Date Taking? Authorizing Provider  ARIPiprazole (ABILIFY) 2 MG tablet Take 2 mg by mouth daily.   Yes Historical Provider, MD  buPROPion (WELLBUTRIN XL) 300 MG 24 hr tablet Take 300 mg by mouth  daily.   Yes Historical Provider, MD  cholecalciferol (VITAMIN D) 1000 UNITS tablet Take 1,000 Units by mouth daily.    Yes Historical Provider, MD  finasteride (PROSCAR) 5 MG tablet Take 5 mg by mouth daily.     Yes Historical Provider, MD  L-Methylfolate-Algae-B12-B6 Glade Stanford) 3-90.314-2-35 MG CAPS Take one capsule daily. 11/02/15  Yes Marcial Pacas, MD  nortriptyline (PAMELOR) 25 MG capsule Take 2 capsules (50 mg total) by mouth at bedtime. 09/25/15  Yes Dennie Bible, NP  OXcarbazepine (TRILEPTAL) 150 MG tablet Take 1 tablet (150 mg total) by mouth 2 (two) times daily. 09/25/15  Yes Dennie Bible, NP  oxyCODONE-acetaminophen (PERCOCET) 10-325 MG tablet Take 1 tablet by mouth every 6 (six) hours as needed for pain. 05/03/15  Yes Ripudeep K Rai, MD  OXYCONTIN 20 MG T12A 12 hr tablet TAKE 1 TABLET BY ORAL ROUTE EVERY 12 HOURS (DNF 09/24/14) 09/27/14  Yes Historical Provider, MD  pantoprazole (PROTONIX) 40 MG tablet Take 40 mg by mouth daily.     Yes Historical Provider, MD  polyethylene glycol (MIRALAX / GLYCOLAX) packet Take 17 g by mouth daily as needed for mild constipation. 05/03/15  Yes Ripudeep K Rai, MD  temazepam (RESTORIL) 15 MG capsule Take 15 mg by mouth at bedtime.   Yes Historical Provider, MD  Vilazodone HCl (VIIBRYD) 20 MG TABS Take 20 mg by mouth daily.   Yes Historical Provider, MD  vitamin B-12 (CYANOCOBALAMIN) 1000 MCG tablet Take 1,000 mcg by mouth daily.   Yes Historical Provider, MD  QUEtiapine (SEROQUEL) 300 MG tablet Take 600 mg by mouth at bedtime. Reported on 05/10/2015    Historical Provider, MD    Physical Exam:    Vitals:   01/22/16 1245 01/22/16 1330 01/22/16 1415 01/22/16 1515  BP: 152/87 150/73 127/78 133/81  Pulse: 84 85 89 88  Resp: _0 SpO2: 100% 99% 100% 100%  Weight:      Height:           Constitutional: NAD, calm, comfortable Vitals:   01/22/16 1245 01/22/16 1330 01/22/16 1415 01/22/16 1515  BP: 152/87 150/73 127/78 133/81  Pulse: 84  85 89 88  Resp: _1 SpO2: 100% 99% 100% 100%  Weight:      Height:       Eyes: PERRL, lids and conjunctivae normal ENMT: Mucous membranes are moist. Posterior pharynx clear of any exudate or lesions.Normal dentition.  Neck: normal, supple, no masses, no thyromegaly Respiratory: clear to auscultation bilaterally, no wheezing, no crackles. Normal respiratory effort. No accessory muscle use.  Cardiovascular: Regular rate and rhythm, no murmurs / rubs / gallops. No extremity edema. 2+ pedal pulses. No carotid bruits.  Abdomen: no tenderness, no masses palpated. No hepatosplenomegaly. Bowel sounds positive.  Musculoskeletal: no clubbing / cyanosis. No joint deformity upper and lower extremities. Good ROM, no contractures. Normal muscle tone.  Skin: left resolving erythema on the left arm  Neurologic:remarkable for intentional tremor, memory deficiency and gait unable to be tested due to dizziness. Sensation intact and strength equal     Labs on Admission: I have personally reviewed following labs and imaging studies  CBC:  Recent Labs Lab 01/22/16 1219  WBC 5.3  HGB 10.2*  HCT 29.2*  MCV 90.1  PLT 784    Basic Metabolic Panel:  Recent Labs Lab 01/22/16 1219  NA 125*  K 4.2  CL 91*  CO2 26  GLUCOSE 108*  BUN 9  CREATININE 1.06  CALCIUM 9.2    GFR: Estimated Creatinine Clearance: 71.1 mL/min (by C-G formula based on SCr of 1.06 mg/dL).  Liver Function Tests:  Recent Labs Lab 01/22/16 1219  AST 24  ALT 15*  ALKPHOS 80  BILITOT 0.7  PROT 6.9  ALBUMIN 4.3   No results for input(s): LIPASE, AMYLASE in the last 168 hours. No results for input(s): AMMONIA in the last 168 hours.  Coagulation Profile:  Recent Labs Lab 01/22/16 1219  INR 1.07    Cardiac Enzymes:  Recent Labs Lab 01/22/16 1344  TROPONINI <0.03    BNP (last 3 results) No results for input(s): PROBNP in the last 8760 hours.  HbA1C: No results for input(s): HGBA1C in the last  72 hours.  CBG: No results for input(s): GLUCAP in the last 168 hours.  Lipid Profile: No results for input(s): CHOL, HDL, LDLCALC, TRIG, CHOLHDL, LDLDIRECT in the last 72 hours.  Thyroid Function Tests: No results for input(s): TSH, T4TOTAL, FREET4, T3FREE, THYROIDAB in the last 72 hours.  Anemia Panel: No results for input(s): VITAMINB12, FOLATE, FERRITIN, TIBC, IRON, RETICCTPCT in the last 72 hours.  Urine analysis:    Component Value Date/Time   COLORURINE YELLOW 05/02/2015 [place] 05/02/2015 2204   LABSPEC 1.014 05/02/2015 2204   PHURINE 7.5 05/02/2015 2204   GLUCOSEU NEGATIVE 05/02/2015 2204   HGBUR NEGATIVE 05/02/2015 2204   BILIRUBINUR NEGATIVE 05/02/2015 2204   KETONESUR NEGATIVE 05/02/2015 2204   PROTEINUR NEGATIVE 05/02/2015 2204   NITRITE NEGATIVE 05/02/2015 2204   LEUKOCYTESUR NEGATIVE 05/02/2015 2204    Sepsis Labs: _0 (procalcitonin:4,lacticidven:4) )No results found for this or any previous visit (from the past 240 hour(s)).   Radiological Exams on Admission: No results found.  EKG: Independently reviewed.  Assessment/Plan Active Problems:   Peripheral axonal neuropathy   Memory loss   Depression   GERD (gastroesophageal reflux disease)   BPH (benign prostatic hyperplasia)   Hyponatremia   Gait instability   Dizziness with gait instability : Etiology likely multifactorial including  medication induced, hyponatremia (see below), recent Benadryl large dose   Patient is unable to stand due to gait instability, he reports staggering to the left side . MRI of the brain is pending. Neuro consult is pending for stroke like symptoms workup. Neuro also to evaluate the multiple psych meds patient is in.  EKG and Tn normal. No falls.  - Telemetry, observation Stroke order set  - Meclizine when necessary dizziness - Neurology consult  - orthostatics when able to stand     2D echo    EKG  PT/OT   Hyponatremia likely due to  polydipsia vs. Antidepressant and other psych meds . Current NA 125 , Bl 130s, except 1 episode of 128 in March 2016 as OP . Urine sodium  pending . Received 1 L bolus  -  Serum osmolality -  Urine sodium -  Urine osmolality -  TSH -  Cortisol level -  Cycle Na to avoid correcting more than 9 meq over 24 hour IVF at 75 cc/h    Urine retention in a patient with a history of BPH  WBC normal  Bladder Scan UA with CX Continue Hytrin   Peripheral Axonal Neuropathy  Continue Neurontin  Depression  Continue antidepressant  With Wellbutrin, Pamelor and Vil[place]zone Neuro to look into these meds, as well   GERD, no acute symptoms: Continue PPI  Mild dementia, followed by PCP as OP Continue meds for now with Abilify, Wellbutrin, Pamelor, Seroquel, Vilazodone  Trilepta. Restoril.  Neuro to recommend  adjustment , appreciate their input      DVT prophylaxis: SCD's  Code Status: DNR  Family Communication:  Discussed with patient Disposition Plan: Expect patient to be discharged to home after condition improves Consults called:    Neuro  Admission status:Tele  Obs  [facility] E, PA-C [facility]   01/22/2016, 3:49 PM

## 2016-01-22 NOTE — ED Notes (Signed)
Witness Erin [employer] banker, [employer]) perform In and Out cath on pt.

## 2016-01-22 NOTE — ED Triage Notes (Signed)
Pt. Coming from home via [facility] for rash on left arm starting last week as well as gait problem and slurred speech since  Yesterday at 11am. Pt. Reports taking 3x benadryl yesterday morning and started having symptoms afterwards. Pt. Reports the rash to left arm itches, but has no spread anywhere else. Pt. Aox4. Pt. Noted to have difficulty walking from stretcher to bed. Pt. Reports not having any issues with ambulation prior to yesterday.

## 2016-01-22 NOTE — ED Notes (Signed)
Pt. Coming from home via EMS for gait abnormality since 11am yesterday. Pt. Noted to have sodium of 125 and was given NS bolus. Pt. Transported to MRI at this time and will send up when he returns. Pt. No other neuro symptoms and has NIH of 0. Pt. Still unable to walk with steady gait. Pt. Passed stroke swallow exam. Next neuro check due 2000. Pt. DNR.   Pt. Noted to have >999 in bladder during bladder scan. 657mL drained via in and out catheter. Admitting MD recommending bladder scan in around 4 hours and to call with results.

## 2016-01-22 NOTE — Consult Note (Signed)
Neurology Consultation Reason for Consult: Gait instability and slurred speech Referring Physician: EDP  CC: Trouble walking and slurred speech  History is obtained from: Patient, Dustin [name]  HPI: Dustin [name] is a 75 y.o. male with PMHx of mild axonal peripheral neuropathy, depression, chronic neck and back pain (receives epidural injections), BPH, and GERD who presented to the ED with complaint of gait problem which started on 12/3 at 11 am. Patient states he noticed difficulty walking feeling like he was unable to go the right direction and unsteadiness. He denied shuffling gait. He denied falls. He denies any history of difficult ambulation. He denies any new medications or medication changes. He denies associated lightheadedness, headache, weakness, new numbness, chest pain, shortness of breath, nausea, vomiting, diarrhea, increased urination or dysuria. He states his wife noted some slurred speech yesterday, but he denies noticing this. Patient is tremulous on exam in upper and lower extremities. He states this started in the ED and attributes this to withdrawals from his opioid medications; however, he has never experienced withdrawal before.   In the ED, BP 153/80, HR 88, RR 19, and pulse ox 97% on room air. CBC showed WBC 5.3 and Hgb 10.2, platelets 166. BMET showed hyponatremia of 125, Cr. 1.06. Troponin negative.   ROS: A 14 point ROS was performed and is negative except as noted in the HPI.   Past Medical History:  Diagnosis Date  . BPH (benign prostatic hyperplasia)   . GERD (gastroesophageal reflux disease)    protonix for control  . Insomnia   . Neck pain   . Neuropathy ([place])    Family Hx: Brother- CVA  Social History:  reports that he quit smoking about 26 years ago. He smoked 1.50 packs per day. He has never used smokeless tobacco. He reports that he does not drink alcohol or use drugs.  Physical Exam  Vitals:   01/22/16 1415 01/22/16 1515 01/22/16 1545 01/22/16  1600  BP: 127/78 133/81 129/72 133/73  Pulse: 89 88 90 89  Resp: 22 18 14 20   SpO2: 100% 100% 100% 100%  Weight:      Height:       General: Vital signs reviewed.  Patient is well-developed and well-nourished, in no acute distress and cooperative with exam.  Head: Normocephalic and atraumatic. Eyes: EOMI, conjunctivae normal, no scleral icterus.  Neck: Supple, trachea midline, no carotid bruit present.  Cardiovascular: RRR Pulmonary/Chest: Clear to auscultation bilaterally, no wheezes, rales, or rhonchi. Abdominal: Soft, non-tender, non-distended, BS + Extremities: No lower extremity edema bilaterally Skin: Erythema with scaling on left forearm.  Psychiatric: Normal mood and affect. speech and behavior is normal. Cognition and memory are abnormal.   Neuro: Mental Status: Patient is awake, alert, oriented to person, place, month, year, and situation. Mild attention deficit. Has difficulty remembering temporal sequence of events regarding recent ankle injuries and admission for such. Otherwise, the patient is able to give a clear and coherent history. No signs of aphasia, specifically, intact repetition and naming with fluent speech and normal comprehension. No hemineglect noted.  Cranial Nerves: II: Visual Fields are full. Pupils are equal, round, and reactive to light.  III,IV, VI: EOMI without ptosis or diploplia.  V: Facial sensation is symmetric to temperature VII: Facial movement is symmetric.  VIII: hearing is intact to voice X: Uvula elevates symmetrically XI: Shoulder shrug is symmetric. XII: tongue is midline without atrophy or fasciculations.  Motor: Tone is normal. Bulk is normal. 5/5 strength was present in all four  extremities.  Sensory: Sensation is symmetric to light touch and temperature in the arms. Decreased sensation bilaterally in lower extremities and feet.  Plantars: Toes are downgoing bilaterally.  Cerebellar:  FNF difficult and abnormal bilaterally with  action and postural tremor noted. Dystaxic with HKS bilaterally.  Abnormal rapid alternating hand movements on right, normal on left. Unable to assess gait as patient felt unsafe.   I have reviewed labs in epic and the results pertinent to this consultation are: CBC showed WBC 5.3 and Hgb 10.2, platelets 166. BMET showed hyponatremia of 125, Cr. 1.06. Troponin negative.   I have reviewed the images obtained: No images obtained.  I called patient's [facility] in [address], [place] to review his medications as patient was unable to verify: [facility] confirmed patient is taking Bupropion, nortriptyline, oxcarbazepine (unsteady gait), Oxycodone, Oxycontin, pantoprazole, miralax, temazepam, quetiapine, vitamin B12.   Impression: Gait Instability and Chronic Hyponatremia. Etiology of gait instability DDx includes cerebellar ataxia, hyponatremia, versus polypharmacy. Doubt Wernicke's given patient does not drink alcohol. Doubt Parkinson's given rapid onset. Causes of Chronic Hyponatremia is likely secondary to medication effect versus hypovolemic hyponatremia given poor po intake.   I am primarily concerned for cerebellar ataxia given patient's heel-shin and finger-nose dysmetria and difficulty with rapid repetitive movements.   Recommendations: 1) MRI Brain 2) PT/OT 3) Work up for and treatment of hyponatremia- careful, slow correction with NS 4) TSH 5) Vitamin E level   Martyn Malay, DO PGY-3 Internal Medicine Resident Pager # [phone] 01/22/2016 4:52 PM  Attending addendum: MRI resulted showing no acute stroke. Posterior fossa structures unremarkable. Mild chronic microvascular ischemic changes and parenchymal volume loss of the brain noted. Patent circle of Willis without evidence for high-grade stenosis, large vessel occlusion, or aneurysm.  Labs revealed hyponatremia. If his hyponatremia is chronic, it may be the underlying etiology for his gait disturbance and falls. Family endorses some memory  difficulty which can also be related to chronic hyponatremia. Mild attention deficit was noted on exam, which also could be secondary to hyponatremia. The atrophy seen on MRI is suggestive also of possible underlying degenerative dementing process - epidemiologically this would most likely be Alzheimers.   Additional recommendations:  1. May need to be switched from oxcarbazepine, which may be the underlying etiology for his hyponatremia, to an alternate agent, such as Neurontin or pregabalin.  2. Should also undergo assessment for other possible etiologies of his hyponatremia. 3. Will need repeat Na level after discharge. 4. Follow up with [facility] for cognitive/dementia assessment.   Electronically signed: Dr. Kerney Elbe

## 2016-01-22 NOTE — ED Notes (Signed)
Wheeled pt to bathroom, assisted pt on toilet. No issues.

## 2016-01-23 ENCOUNTER — Observation Stay (HOSPITAL_COMMUNITY): Payer: Medicare Other

## 2016-01-23 DIAGNOSIS — K219 Gastro-esophageal reflux disease without esophagitis: Secondary | ICD-10-CM | POA: Diagnosis present

## 2016-01-23 DIAGNOSIS — R42 Dizziness and giddiness: Secondary | ICD-10-CM | POA: Diagnosis not present

## 2016-01-23 DIAGNOSIS — R270 Ataxia, unspecified: Secondary | ICD-10-CM | POA: Diagnosis not present

## 2016-01-23 DIAGNOSIS — R278 Other lack of coordination: Secondary | ICD-10-CM | POA: Diagnosis present

## 2016-01-23 DIAGNOSIS — Z87891 Personal history of nicotine dependence: Secondary | ICD-10-CM | POA: Diagnosis not present

## 2016-01-23 DIAGNOSIS — R2681 Unsteadiness on feet: Secondary | ICD-10-CM | POA: Diagnosis not present

## 2016-01-23 DIAGNOSIS — G608 Other hereditary and idiopathic neuropathies: Secondary | ICD-10-CM | POA: Diagnosis not present

## 2016-01-23 DIAGNOSIS — R251 Tremor, unspecified: Secondary | ICD-10-CM | POA: Diagnosis present

## 2016-01-23 DIAGNOSIS — E871 Hypo-osmolality and hyponatremia: Secondary | ICD-10-CM | POA: Diagnosis not present

## 2016-01-23 DIAGNOSIS — N401 Enlarged prostate with lower urinary tract symptoms: Secondary | ICD-10-CM | POA: Diagnosis present

## 2016-01-23 DIAGNOSIS — G4700 Insomnia, unspecified: Secondary | ICD-10-CM | POA: Diagnosis present

## 2016-01-23 DIAGNOSIS — E860 Dehydration: Secondary | ICD-10-CM | POA: Diagnosis present

## 2016-01-23 DIAGNOSIS — R262 Difficulty in walking, not elsewhere classified: Secondary | ICD-10-CM | POA: Diagnosis present

## 2016-01-23 DIAGNOSIS — R21 Rash and other nonspecific skin eruption: Secondary | ICD-10-CM | POA: Diagnosis present

## 2016-01-23 DIAGNOSIS — Z981 Arthrodesis status: Secondary | ICD-10-CM | POA: Diagnosis not present

## 2016-01-23 DIAGNOSIS — R413 Other amnesia: Secondary | ICD-10-CM

## 2016-01-23 DIAGNOSIS — Z79899 Other long term (current) drug therapy: Secondary | ICD-10-CM

## 2016-01-23 DIAGNOSIS — M50222 Other cervical disc displacement at C5-C6 level: Secondary | ICD-10-CM | POA: Diagnosis not present

## 2016-01-23 DIAGNOSIS — F329 Major depressive disorder, single episode, unspecified: Secondary | ICD-10-CM | POA: Diagnosis not present

## 2016-01-23 DIAGNOSIS — M5124 Other intervertebral disc displacement, thoracic region: Secondary | ICD-10-CM | POA: Diagnosis not present

## 2016-01-23 DIAGNOSIS — M542 Cervicalgia: Secondary | ICD-10-CM | POA: Diagnosis present

## 2016-01-23 DIAGNOSIS — I6789 Other cerebrovascular disease: Secondary | ICD-10-CM | POA: Diagnosis not present

## 2016-01-23 DIAGNOSIS — F0390 Unspecified dementia without behavioral disturbance: Secondary | ICD-10-CM | POA: Diagnosis present

## 2016-01-23 DIAGNOSIS — R338 Other retention of urine: Secondary | ICD-10-CM | POA: Diagnosis present

## 2016-01-23 DIAGNOSIS — Z66 Do not resuscitate: Secondary | ICD-10-CM | POA: Diagnosis present

## 2016-01-23 DIAGNOSIS — T421X5A Adverse effect of iminostilbenes, initial encounter: Secondary | ICD-10-CM | POA: Diagnosis present

## 2016-01-23 DIAGNOSIS — M50221 Other cervical disc displacement at C4-C5 level: Secondary | ICD-10-CM | POA: Diagnosis not present

## 2016-01-23 DIAGNOSIS — F419 Anxiety disorder, unspecified: Secondary | ICD-10-CM | POA: Diagnosis present

## 2016-01-23 DIAGNOSIS — Z79891 Long term (current) use of opiate analgesic: Secondary | ICD-10-CM | POA: Diagnosis not present

## 2016-01-23 DIAGNOSIS — G629 Polyneuropathy, unspecified: Secondary | ICD-10-CM | POA: Diagnosis present

## 2016-01-23 DIAGNOSIS — R4781 Slurred speech: Secondary | ICD-10-CM | POA: Diagnosis present

## 2016-01-23 DIAGNOSIS — R627 Adult failure to thrive: Secondary | ICD-10-CM | POA: Diagnosis present

## 2016-01-23 DIAGNOSIS — K210 Gastro-esophageal reflux disease with esophagitis: Secondary | ICD-10-CM | POA: Diagnosis not present

## 2016-01-23 LAB — LIPID PANEL
Cholesterol: 143 mg/dL (ref 0–200)
HDL: 54 mg/dL (ref >40)
LDL Cholesterol: 77 mg/dL (ref 0–99)
Total CHOL/HDL Ratio: 2.6 RATIO
Triglycerides: 60 mg/dL (ref <150)
VLDL: 12 mg/dL (ref 0–40)

## 2016-01-23 LAB — FOLATE: Folate: 47.2 ng/mL (ref >5.9)

## 2016-01-23 LAB — VITAMIN B12: Vitamin B-12: 2613 pg/mL — ABNORMAL HIGH (ref 180–914)

## 2016-01-23 MED ORDER — SENNOSIDES-DOCUSATE SODIUM 8.6-50 MG PO TABS
2.0000 | ORAL_TABLET | Freq: Two times a day (BID) | ORAL | Status: DC
  Administered 2016-01-24 (×2): 2 via ORAL
  Filled 2016-01-23 (×4): qty 2

## 2016-01-23 MED ORDER — TAMSULOSIN HCL 0.4 MG PO CAPS
0.4000 mg | ORAL_CAPSULE | Freq: Every day | ORAL | Status: DC
Start: 2016-01-23 — End: 2016-01-25
  Administered 2016-01-24 – 2016-01-25 (×2): 0.4 mg via ORAL
  Filled 2016-01-23 (×2): qty 1

## 2016-01-23 MED ORDER — LAMOTRIGINE 25 MG PO TABS
25.0000 mg | ORAL_TABLET | Freq: Every day | ORAL | Status: DC
  Administered 2016-01-24 – 2016-01-25 (×2): 25 mg via ORAL
  Filled 2016-01-23 (×2): qty 1

## 2016-01-23 MED ORDER — GADOBENATE DIMEGLUMINE 529 MG/ML IV SOLN
20.0000 mL | Freq: Once | INTRAVENOUS | Status: AC
  Administered 2016-01-23: 19 mL via INTRAVENOUS

## 2016-01-23 MED ORDER — CLONAZEPAM 0.5 MG PO TABS
0.2500 mg | ORAL_TABLET | Freq: Once | ORAL | Status: AC
  Administered 2016-01-23: 0.25 mg via ORAL
  Filled 2016-01-23: qty 1

## 2016-01-23 MED ORDER — BUPROPION HCL ER (XL) 150 MG PO TB24
150.0000 mg | ORAL_TABLET | Freq: Every day | ORAL | Status: DC
  Administered 2016-01-24 – 2016-01-25 (×2): 150 mg via ORAL
  Filled 2016-01-23 (×2): qty 1

## 2016-01-23 NOTE — Progress Notes (Signed)
Thank you for consult on Dustin [name]. Chart reviewed and note that patient under observation status due to gait abnormality with significant hyponatremia and significant ataxia. He has history of mild axonal peripheral neuropathy and memory loss. He is on multiple mediations including--Oxycodone, OxyContin, oxcarbazepine, nortriptyline, temazepam and Seroquel. MRI brain negative and question of polypharmacy v/s hypovolemia v/s chronic hyponatremia being worked up as cause of ataxia. MRI cervical and thoracic spine without significant stenosis or myelopathy. He does not have a diagnosis to justify rehab stay. Will follow at a distance pending further work up.

## 2016-01-23 NOTE — Evaluation (Addendum)
Speech Language Pathology Evaluation Patient Details Name: Dustin [name] MRN: [id number] DOB: 01-13-1941 Today's Date: 01/23/2016 Time: [id number] SLP Time Calculation (min) (ACUTE ONLY): 41 min  Problem List:  Patient Active Problem List   Diagnosis Date Noted  . Hyponatremia 01/22/2016  . Gait instability 01/22/2016  . Urinary retention   . Stroke-like symptom   . Syncope 05/02/2015  . Closed left fibular fracture 05/02/2015  . GERD (gastroesophageal reflux disease) 05/02/2015  . BPH (benign prostatic hyperplasia) 05/02/2015  . Fracture of distal end of left fibula   . 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 Depression 01/30/2015  . 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 BPH (benign prostatic hyperplasia)   . GERD (gastroesophageal reflux disease)    protonix for control  . Insomnia   . Neck pain   . Neuropathy [facility])    Past Surgical History:  Past Surgical History:  Procedure Laterality Date  . ANTERIOR CERVICAL DECOMPRESSION/DISCECTOMY FUSION 4 LEVELS N/A 10/27/2012   Procedure: Cervical Three-Four Cervical Four-Five Cervical Five-Six Cervical Six-Seven  Anterior cervical decompression/diskectomy/fusion;  Surgeon: Erline Levine, MD;  Location: [facility];  Service: Neurosurgery;  Laterality: N/A;  Cervical Three-Four Cervical Four-Five Cervical Five-Six Cervical Six-Seven  Anterior cervical decompression/diskectomy/fusion  . SHOULDER ARTHROSCOPY  2010   rt shoulder  . SHOULDER ARTHROSCOPY  02/15/2011   Procedure: ARTHROSCOPY SHOULDER;  Surgeon: Cynda Familia;  Location: [facility];  Service: Orthopedics;  Laterality: Right;  Debridement  . TONSILLECTOMY     HPI:  Pt is a 72 male adm to [name] with AMS, dysarthria, found to have hyponatremic.  MRI negative.  CXR negative 12/4.  Pt is s/p C3-C7 ACDF.     Assessment / Plan / Recommendation Clinical Impression  MOCA administered with pt scoring  18/30 points - showing severe cognitive impairment- uncertain level of baseline difficulties.  Functionally pt with poor attention requiring frequent repetitions and redirection during tasks and expressive language deficits (word finding impairments).  He named 3 words in 60 seconds with normal being 11 or above.  Attention difficulties noted for longer tasks with perseveration x1.   Pt did not recall any of the 5 words provided but was able to identify them from choice of 3 and named one with category cue- memory index score was 6/15.    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  SLP recommends pt have 24/7 supervision for safety.  Note pt has "memory deficits" diagnosed 01/2015- ? If at baseline or worse.   Recommend follow up for functional tasks to help manage home duties - pt agreeable to plan after SlP informed him of results on test.    Pt reported daughter in law and son come visit daily and they could with medications/bills, etc.  SLP questions impact of hyponatremia on current cognitive ability.    No family present to establish baseline.     SLP Assessment  Patient needs continued Speech Lanaguage Pathology Services    Follow Up Recommendations  Home health SLP;24 hour supervision/assistance    Frequency and Duration min 2x/week  2 weeks      SLP Evaluation Cognition  Overall Cognitive Status: No family/caregiver present to determine baseline cognitive functioning Arousal/Alertness: Awake/alert Orientation Level: Oriented X4 Attention: Sustained Sustained Attention: Impaired Sustained Attention Impairment: Verbal basic Memory: Impaired Memory Impairment: Retrieval deficit;Decreased recall of new information  Awareness: Impaired Awareness Impairment: Intellectual impairment Problem Solving: Impaired Safety/Judgment: Impaired       Comprehension  Auditory Comprehension Overall  Auditory Comprehension: Impaired Yes/No Questions: Not tested Commands: Within Functional Limits (for basic directions) Conversation: Complex Other Conversation Comments: attention impairs pt's ability to follow conversation Interfering Components: Attention;Processing speed EffectiveTechniques: Extra processing time;Repetition Visual Recognition/Discrimination Discrimination: Within Function Limits Reading Comprehension Reading Status: Not tested    Expression Expression Primary Mode of Expression: Verbal Verbal Expression Overall Verbal Expression: Impaired Initiation: No impairment Level of Generative/Spontaneous Verbalization: Sentence Repetition: Impaired Level of Impairment: Sentence level Naming: No impairment Pragmatics: No impairment Effective Techniques: Phonemic cues Other Verbal Expression Comments: pt with word finding deficits resulting in pauses/interuptions in expressive language/speech Written Expression Dominant Hand: Left Written Expression: Not tested   Oral / Motor  Oral Motor/Sensory Function Overall Oral Motor/Sensory Function:  (mild dysarthria and mild weakness on right) Motor Speech Overall Motor Speech: Appears within functional limits for tasks assessed Respiration: Within functional limits Phonation: Normal Resonance: Within functional limits Articulation: Impaired Intelligibility: Intelligible Motor Planning: Witnin functional limits   GO          Functional Assessment Tool Used: MOCA Functional Limitations: Memory Memory Goal Status [id number]): At least 60 percent but less than 80 percent impaired, limited or restricted Memory Discharge Status [phone]): At least 80 percent but less than 100 percent impaired, limited or restricted        Luanna Salk, [facility] SLP [phone]

## 2016-01-23 NOTE — Progress Notes (Addendum)
PROGRESS NOTE  KEYSHAWN [name] [id number] DOB: 12-Jun-1940 DOA: 01/22/2016 PCP: Lujean Amel, MD   Brief Summary: FTT, Gait instability, hyponatremia, ataxia, polypharmacy. Neurology is consulted,  Mri spine pending, need to decide inpatient rehab vs snf vs home health Family states they can arrange 24/7 care at home.   HPI/Recap of past 24 hours:  Patient is a poor historian due to memory impairment, he is aaox3,  Does has significant ataxia on exam  Report he could not walk at home, so he called EMS to bring him to the hospital.  Assessment/Plan: Active Problems:   Peripheral axonal neuropathy   Memory loss   Depression   GERD (gastroesophageal reflux disease)   BPH (benign prostatic hyperplasia)   Hyponatremia   Gait instability  Gait instability/ataxia:  Not sure acute or subacute. Report not able to ambulate at home, report recent injury (700pound safe fell on him) MRI brain no acute findings, MRI spine pending. Correct hyponatremia, correct polypharmacy. Neurology consulted, input appreciated. PT/OT, may benefit from inpatient rehab  Hyponatremia:  tsh /cortisol wnl Hyponatremia Possible From dehydration, meds On gentle hydration. D/c trileptal ( this was started in 09/2015 by neurology) Repeat lab in am  Memory impairment:  From polypharmacy, hyponatremia +/- underline dementia? Will treat hyponatremia, correct polypharmacy,  Neurology consulted.  Depression/anxiety: with polypharmacy I have reviewed neurology clinic note in 09/2015 and last discharge summary in 04/2015 from the hospital, several of the meds appear is new. Patient could not tell me which meds has helped him  d/c abilify ( on a very low dose),  D/c restoril, d/c trileptal. Taper lamictal, taper wellbutrin, all these meds appear to be new  Continue nortriptyline which appears to be a medication that he has been on for a while for depression, peripheral neuropathy and insomnia seroquel also  appear to be a medication that he have been on for a while is also continued.   Chronic pain: Patient has been on oxycontin and percocet chronically, these are continued. Continue miralax, add sennakot.   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 I have explained to the patient he will benefit from getting rehab.  Though he prefer to go home, He is open to inpatient rehab, SNF or home health, his family reports can provide 24-7 care at home.  Code Status: DNR, confirmed with patient   Family Communication: patient  , his daughter in law in room  Disposition Plan: pending   Consultants:  neurology  Procedures:  none  Antibiotics:  none   Objective: BP (!) 153/76 (BP Location: Right Arm)   Pulse 96   Temp 98.2 F (36.8 C) (Oral)   Resp 20   Ht 6\' 2"  (1.88 m)   Wt 90.3 kg (199 lb 1.2 oz)   SpO2 100%   BMI 25.56 kg/m   Intake/Output Summary (Last 24 hours) at 01/23/16 1628 Last data filed at 01/23/16 1555  Gross per 24 hour  Intake             2230 ml  Output              600 ml  Net             1630 ml   Filed Weights   01/22/16 1212 01/22/16 2055  Weight: 86.2 kg (190 lb) 90.3 kg (199 lb 1.2 oz)    Exam:   General:  NAD, aaox3, impaired short term memory  Cardiovascular: RRR  Respiratory: CTABL  Abdomen: Soft/ND/NT, positive BS  Musculoskeletal: No Edema  Neuro: aaox3, impaired short term memory, significant ataxia, no focal deficit   Data Reviewed: Basic Metabolic Panel:  Recent Labs Lab 01/22/16 1219 01/22/16 1907  NA 125* 126*  K 4.2  --   CL 91*  --   CO2 26  --   GLUCOSE 108*  --   BUN 9  --   CREATININE 1.06  --   CALCIUM 9.2  --    Liver Function Tests:  Recent Labs Lab 01/22/16 1219  AST 24  ALT 15*  ALKPHOS 80  BILITOT 0.7  PROT 6.9  ALBUMIN 4.3   No results for input(s): LIPASE, AMYLASE in the last 168 hours. No results for input(s): AMMONIA in the last 168  hours. CBC:  Recent Labs Lab 01/22/16 1219  WBC 5.3  HGB 10.2*  HCT 29.2*  MCV 90.1  PLT 166   Cardiac Enzymes:    Recent Labs Lab 01/22/16 1344 01/22/16 1802  TROPONINI <0.03 <0.03   BNP (last 3 results) No results for input(s): BNP in the last 8760 hours.  ProBNP (last 3 results) No results for input(s): PROBNP in the last 8760 hours.  CBG: No results for input(s): GLUCAP in the last 168 hours.  Recent Results (from the past 240 hour(s))  Culture, blood (Routine X 2) w Reflex to ID Panel     Status: None (Preliminary result)   Collection Time: 01/22/16  5:54 PM  Result Value Ref Range Status   Specimen Description BLOOD RIGHT FOREARM  Final   Special Requests BOTTLES DRAWN AEROBIC ONLY 5CC  Final   Culture NO GROWTH < 24 HOURS  Final   Report Status PENDING  Incomplete  Culture, blood (Routine X 2) w Reflex to ID Panel     Status: None (Preliminary result)   Collection Time: 01/22/16  6:02 PM  Result Value Ref Range Status   Specimen Description BLOOD RIGHT ANTECUBITAL  Final   Special Requests BOTTLES DRAWN AEROBIC ONLY 5CC  Final   Culture NO GROWTH < 24 HOURS  Final   Report Status PENDING  Incomplete     Studies: Dg Chest 2 View  Result Date: 01/22/2016 CLINICAL DATA:  Imbalance while walking today. EXAM: CHEST  2 VIEW COMPARISON:  12/17/2012 CXR FINDINGS: The heart size and mediastinal contours are within normal limits. Minimal atelectasis at the left lung base. No pneumonic consolidation, CHF or pneumothorax. Mild eventration of the right hemidiaphragm. The visualized skeletal structures are unremarkable. Low anterior cervical disc fusion is partially imaged. IMPRESSION: No active cardiopulmonary disease. Electronically Signed   By: Ashley Royalty M.D.   On: 01/22/2016 17:13   Mr Brain Wo Contrast  Result Date: 01/22/2016 CLINICAL DATA:  75 y/o  M; gait problem and slurred speech. EXAM: MRI HEAD WITHOUT CONTRAST MRA HEAD WITHOUT CONTRAST TECHNIQUE:  Multiplanar, multiecho pulse sequences of the brain and surrounding structures were obtained without intravenous contrast. Angiographic images of the head were obtained using MRA technique without contrast. COMPARISON:  04/22/2012 MRI of the brain. FINDINGS: MRI HEAD FINDINGS Brain: No acute infarction, hemorrhage, hydrocephalus, extra-axial collection or mass lesion. Mild T2 FLAIR hyperintense signal abnormality in periventricular white matter is compatible with mild chronic microvascular ischemic changes. Mild brain parenchymal volume loss. Vascular: As below. Skull and upper cervical spine: Normal marrow signal. Sinuses/Orbits: Negative. Other: None. MRA HEAD FINDINGS Internal carotid arteries:  Patent. Anterior cerebral arteries:  Patent. Middle cerebral arteries: Patent.  Anterior communicating artery: Patent. Posterior communicating arteries:  Patent. Posterior cerebral arteries: Patent. Asymmetry with less left PCA distribution flow related signal relative to the right without appreciable proximal obstructing lesion of uncertain significance, possibly artifactual. Basilar artery:  Patent. Vertebral arteries:  Patent.  Right dominant vertebrobasilar system. No evidence of high-grade stenosis, large vessel occlusion, or aneurysm. IMPRESSION: 1. No acute intracranial abnormality. 2. Mild chronic microvascular ischemic changes and parenchymal volume loss of the brain. 3. Patent circle of Willis without evidence for high-grade stenosis, large vessel occlusion, or aneurysm. 4. Asymmetric less distal left PCA distribution flow related signal relative to the right PCA without appreciable proximal obstructing lesion of uncertain significance, possibly anatomic variant. Electronically Signed   By: Kristine Garbe M.D.   On: 01/22/2016 20:06   Mr Jodene Nam Head/brain [id number] Cm  Result Date: 01/22/2016 CLINICAL DATA:  75 y/o  M; gait problem and slurred speech. EXAM: MRI HEAD WITHOUT CONTRAST MRA HEAD WITHOUT CONTRAST  TECHNIQUE: Multiplanar, multiecho pulse sequences of the brain and surrounding structures were obtained without intravenous contrast. Angiographic images of the head were obtained using MRA technique without contrast. COMPARISON:  04/22/2012 MRI of the brain. FINDINGS: MRI HEAD FINDINGS Brain: No acute infarction, hemorrhage, hydrocephalus, extra-axial collection or mass lesion. Mild T2 FLAIR hyperintense signal abnormality in periventricular white matter is compatible with mild chronic microvascular ischemic changes. Mild brain parenchymal volume loss. Vascular: As below. Skull and upper cervical spine: Normal marrow signal. Sinuses/Orbits: Negative. Other: None. MRA HEAD FINDINGS Internal carotid arteries:  Patent. Anterior cerebral arteries:  Patent. Middle cerebral arteries: Patent. Anterior communicating artery: Patent. Posterior communicating arteries:  Patent. Posterior cerebral arteries: Patent. Asymmetry with less left PCA distribution flow related signal relative to the right without appreciable proximal obstructing lesion of uncertain significance, possibly artifactual. Basilar artery:  Patent. Vertebral arteries:  Patent.  Right dominant vertebrobasilar system. No evidence of high-grade stenosis, large vessel occlusion, or aneurysm. IMPRESSION: 1. No acute intracranial abnormality. 2. Mild chronic microvascular ischemic changes and parenchymal volume loss of the brain. 3. Patent circle of Willis without evidence for high-grade stenosis, large vessel occlusion, or aneurysm. 4. Asymmetric less distal left PCA distribution flow related signal relative to the right PCA without appreciable proximal obstructing lesion of uncertain significance, possibly anatomic variant. Electronically Signed   By: Kristine Garbe M.D.   On: 01/22/2016 20:06    Scheduled Meds: . aspirin  300 mg Rectal Daily   Or  . aspirin  325 mg Oral Daily  . buPROPion  300 mg Oral Daily  . cholecalciferol  1,000 Units Oral  Daily  . finasteride  5 mg Oral Daily  . lamoTRIgine  100 mg Oral Daily  . nortriptyline  50 mg Oral QHS  . oxyCODONE  20 mg Oral Q12H  . pantoprazole  40 mg Oral Daily  . QUEtiapine  600 mg Oral QHS  . vitamin B-12  1,000 mcg Oral Daily    Continuous Infusions: . sodium chloride 75 mL/hr at 01/23/16 1138     Time spent: 78 mins  Londen Bok MD, PhD  [facility] Pager [phone]. If 7PM-7AM, please contact night-coverage at [email or web address], password [facility] 01/23/2016, 4:28 PM  LOS: 0 days

## 2016-01-23 NOTE — Care Management Note (Signed)
Case Management Note  Patient Details  Name: Dustin [name] MRN: [id number] Date of Birth: 1941/01/22  Subjective/Objective:  Pt in with hyponatremia. He is from home with his spouse.                    Action/Plan: HH recommended per PT. Awaiting OT recs. CM following for d/c needs.   Expected Discharge Date:                  Expected Discharge Plan:  [place]  In-House Referral:     Discharge planning Services     Post Acute Care Choice:    Choice offered to:     DME Arranged:    DME Agency:     HH Arranged:    [place] Agency:     Status of Service:  In process, will continue to follow  If discussed at Long Length of Stay Meetings, dates discussed:    Additional Comments:  Pollie Friar, RN 01/23/2016, 2:58 PM

## 2016-01-23 NOTE — Progress Notes (Signed)
Echocardiogram 2D Echocardiogram has been attempted. Pt refused exam at this time.  Dustin [name] 01/23/2016, 3:24 PM

## 2016-01-23 NOTE — Progress Notes (Signed)
Subjective: Currently working with physical therapy. Patient states that he feels a little bit stronger but still feels off balance.  When asked about the use of opiates and benzodiazepines, patient adamantly denies that he has ever used any of these medications. Patient states that the majority of his symptoms occurred when he was moving a safe at his house that we approximate 700 pounds and fell on top of him. He denies any neck injury but he is very vague and the event.  Exam: Vitals:   01/23/16 0818 01/23/16 0900  BP: 135/68 134/72  Pulse: 86 85  Resp:  20  Temp: 98.9 F (37.2 C) 98 F (36.7 C)       Gen: In bed, NAD MS: Patient is alert and oriented follows all commands CN: Cranial nerves II through XII are grossly intact Motor: Bilateral upper extremities and lower extremities show 5/5 strength. I do not note any cogwheeling or increased tone throughout. Sensory: As noted previously sensation is intact. He does have decreased sensation bilaterally in his lower extremities and feet DTR: Deep tendon reflexes are depressed throughout Cerebellar: Bilateral finger to nose shows dysmetria at the end of his movements., Heel-to-shin is extremely dysmetric. Gait: Patient has to use his hands to get up off the bed from a seated position. Once using the walker patient has a very dysmetric and spastic gait which she states is new. He would not be able to keep his balance if he was not using a walker. He also has shuffled steps in addition to fenestrated turning. It should be also noted that patient has a stooped posture while walking with the walker. It is unclear if this is due to the fact he has never used a walker or not.  Pertinent Labs/Diagnostics: MRI brain and MRA brain:  1. No acute intracranial abnormality. 2. Mild chronic microvascular ischemic changes and parenchymal volume loss of the brain. 3. Patent circle of Willis without evidence for high-grade stenosis, large vessel  occlusion, or aneurysm. 4. Asymmetric less distal left PCA distribution flow related signal relative to the right PCA without appreciable proximal obstructing lesion of uncertain significance, possibly anatomic variant.  NA+ now 126 Osmolarity 260  UDS--Positive for Benzo's and barbiturates which are not in is med rec.      Etta Quill PA-C [facility] [phone]  Impression: New onset gait instability that occurred after suffering a incidence where a large heavy gun safe fell on top of him. He was also noted the patient is hyponatremic with most recent sodium of 126. It is also noted that patient had benzodiazepines and barbiturates in his system which he denies that he was taking and is not in his medication reconciliation. Although MRI brain did not show any acute abnormalities concern for possible cervical spine or thoracic spine injury   Recommendations: 1) Will obtain MRI of both cervical and thoracic spine with and without contrast 2) continue physical therapy 3) consider inpatient rehabilitation  Roland Rack, MD [facility] [phone]  If 7pm- 7am, please page neurology on call as listed in [facility].  01/23/2016, 11:50 AM

## 2016-01-23 NOTE — Evaluation (Signed)
Physical Therapy Evaluation Patient Details Name: Dustin [name] MRN: [id number] DOB: 03/21/1940 Today's Date: 01/23/2016   History of Present Illness  75 y.o. male with PMH of neuropathy, insomnia, chronic neck pain s/p ACDF, BPH, syncope with L fib fx and memory loss. He presented to the ED for gait instability and slurred speech.  MRI negative. He was found to be hyponatremic.   Clinical Impression  Pt admitted with above diagnosis. Pt currently with functional limitations due to the deficits listed below (see PT Problem List). On eval, pt required min guard assist bed mobility, min assist transfers and min assist ambulation 50 feet with RW.  Pt will benefit from skilled PT to increase their independence and safety with mobility to allow discharge to the venue listed below.  Pt reports his family will be able to provide 24-hour assist at home. If not, ST SNF will need to be considered.     Follow Up Recommendations Home health PT;Supervision/Assistance - 24 hour    Equipment Recommendations  Rolling walker with 5" wheels    Recommendations for Other Services       Precautions / Restrictions Precautions Precautions: Fall      Mobility  Bed Mobility Overal bed mobility: Needs Assistance Bed Mobility: Supine to Sit;Sit to Supine     Supine to sit: Min guard;HOB elevated Sit to supine: Min guard;HOB elevated   General bed mobility comments: Increased time to complete. Min guard assist for safety.  Transfers Overall transfer level: Needs assistance Equipment used: Rolling walker (2 wheeled) Transfers: Sit to/from [facility] Sit to Stand: Min assist Stand pivot transfers: Min assist       General transfer comment: Heavy use of UEs to power up. Increased time to stabilize initial standing balance.  Ambulation/Gait Ambulation/Gait assistance: Min assist Ambulation Distance (Feet): 50 Feet Assistive device: Rolling walker (2 wheeled) Gait  Pattern/deviations: Ataxic;Drifts right/left;Trunk flexed Gait velocity: decreased Gait velocity interpretation: Below normal speed for age/gender General Gait Details: Pt very shakey/tremulous during ambulation. Continuous verbal cues for posture and to stay inside the RW.  Stairs            Wheelchair Mobility    Modified Rankin (Stroke Patients Only)       Balance Overall balance assessment: Needs assistance Sitting-balance support: No upper extremity supported;Feet supported Sitting balance-Leahy Scale: Good     Standing balance support: Bilateral upper extremity supported;During functional activity Standing balance-Leahy Scale: Poor Standing balance comment: Heavy reliance on RW during ambulation.                             Pertinent Vitals/Pain Pain Assessment: No/denies pain    Home Living Family/patient expects to be discharged to:: Private residence Living Arrangements: Spouse/significant other;Children Available Help at Discharge: Family;Available 24 hours/day (Pt reports one of his children would be able to stay with him.) Type of Home: House Home Access: Ramped entrance     Home Layout: Two level;Able to live on main level with bedroom/bathroom Home Equipment: Wheelchair - manual;Grab bars - tub/shower;Hand held shower head;Shower seat      Prior Function Level of Independence: Independent               Hand Dominance   Dominant Hand: Left    Extremity/Trunk Assessment               Lower Extremity Assessment: Generalized weakness      Cervical / Trunk Assessment: Kyphotic  Communication   Communication: Expressive difficulties  Cognition Arousal/Alertness: Awake/alert   Overall Cognitive Status: No family/caregiver present to determine baseline cognitive functioning Area of Impairment: Attention;Memory;Safety/judgement;Problem solving   Current Attention Level: Selective Memory: Decreased short-term memory    Safety/Judgement: Decreased awareness of safety   Problem Solving: Slow processing;Difficulty sequencing;Requires verbal cues      General Comments      Exercises     Assessment/Plan    PT Assessment Patient needs continued PT services  PT Problem List Decreased strength;Decreased activity tolerance;Decreased balance;Decreased knowledge of use of DME;Decreased coordination;Decreased mobility;Decreased safety awareness          PT Treatment Interventions DME instruction;Gait training;Stair training;Functional mobility training;Balance training;Therapeutic exercise;Therapeutic activities;Patient/family education    PT Goals (Current goals can be found in the Care Plan section)  Acute Rehab PT Goals Patient Stated Goal: home PT Goal Formulation: With patient Time For Goal Achievement: 01/23/16 Potential to Achieve Goals: Fair    Frequency Min 3X/week   Barriers to discharge        Co-evaluation               End of Session Equipment Utilized During Treatment: Gait belt Activity Tolerance: Patient tolerated treatment well Patient left: in bed;with bed alarm set;with call bell/phone within reach Nurse Communication: Mobility status    Functional Assessment Tool Used: clinical judgement Functional Limitation: Mobility: Walking and moving around Mobility: Walking and Moving Around Current Status [id number]): At least 20 percent but less than 40 percent impaired, limited or restricted Mobility: Walking and Moving Around Goal Status [phone]): At least 1 percent but less than 20 percent impaired, limited or restricted    Time: 1147-1207 PT Time Calculation (min) (ACUTE ONLY): 20 min   Charges:   PT Evaluation $PT Eval Moderate Complexity: 1 Procedure     PT G Codes:   PT G-Codes **NOT FOR INPATIENT CLASS** Functional Assessment Tool Used: clinical judgement Functional Limitation: Mobility: Walking and moving around Mobility: Walking and Moving Around Current  Status [id number]): At least 20 percent but less than 40 percent impaired, limited or restricted Mobility: Walking and Moving Around Goal Status [phone]): At least 1 percent but less than 20 percent impaired, limited or restricted    Lorriane Shire 01/23/2016, 12:47 PM

## 2016-01-24 ENCOUNTER — Inpatient Hospital Stay (HOSPITAL_BASED_OUTPATIENT_CLINIC_OR_DEPARTMENT_OTHER): Payer: Medicare Other

## 2016-01-24 DIAGNOSIS — R42 Dizziness and giddiness: Secondary | ICD-10-CM

## 2016-01-24 DIAGNOSIS — F329 Major depressive disorder, single episode, unspecified: Secondary | ICD-10-CM

## 2016-01-24 DIAGNOSIS — I6789 Other cerebrovascular disease: Secondary | ICD-10-CM

## 2016-01-24 LAB — BASIC METABOLIC PANEL
Anion gap: 10 (ref 5–15)
BUN: 7 mg/dL (ref 6–20)
CO2: 24 mmol/L (ref 22–32)
Calcium: 8.9 mg/dL (ref 8.9–10.3)
Chloride: 92 mmol/L — ABNORMAL LOW (ref 101–111)
Creatinine, Ser: 0.89 mg/dL (ref 0.61–1.24)
GFR calc Af Amer: >60 mL/min (ref >60)
GFR calc non Af Amer: >60 mL/min (ref >60)
Glucose, Bld: 101 mg/dL — ABNORMAL HIGH (ref 65–99)
Potassium: 3.7 mmol/L (ref 3.5–5.1)
Sodium: 126 mmol/L — ABNORMAL LOW (ref 135–145)

## 2016-01-24 LAB — URINE CULTURE

## 2016-01-24 LAB — HEMOGLOBIN A1C
Hgb A1c MFr Bld: 5.5 % (ref 4.8–5.6)
Mean Plasma Glucose: 111 mg/dL

## 2016-01-24 LAB — RPR: RPR Ser Ql: NONREACTIVE

## 2016-01-24 LAB — MAGNESIUM: Magnesium: 1.8 mg/dL (ref 1.7–2.4)

## 2016-01-24 LAB — ECHOCARDIOGRAM COMPLETE
Height: 74 in
Weight: 3185.21 oz

## 2016-01-24 MED ORDER — POTASSIUM CHLORIDE CRYS ER 20 MEQ PO TBCR
20.0000 meq | EXTENDED_RELEASE_TABLET | Freq: Once | ORAL | Status: AC
  Administered 2016-01-24: 20 meq via ORAL
  Filled 2016-01-24: qty 1

## 2016-01-24 MED ORDER — ENOXAPARIN SODIUM 40 MG/0.4ML ~~LOC~~ SOLN
40.0000 mg | SUBCUTANEOUS | Status: DC
  Administered 2016-01-24: 40 mg via SUBCUTANEOUS
  Filled 2016-01-24: qty 0.4

## 2016-01-24 MED ORDER — FUROSEMIDE 10 MG/ML IJ SOLN
40.0000 mg | Freq: Once | INTRAMUSCULAR | Status: AC
  Administered 2016-01-24: 40 mg via INTRAVENOUS
  Filled 2016-01-24: qty 4

## 2016-01-24 NOTE — Progress Notes (Signed)
Physical Therapy Treatment Patient Details Name: Dustin [name] MRN: [id number] DOB: 12/23/1940 Today's Date: 01/24/2016    History of Present Illness 76 y.o. male with PMH of neuropathy, insomnia, chronic neck pain s/p ACDF, BPH, syncope with L fib fx and memory loss. He presented to the ED for gait instability and slurred speech.  MRI negative. He was found to be hyponatremic.     PT Comments    Pt progressing towards physical therapy goals. Reports he is back to baseline of function however daughter seems to have doubts. She states she will be available at d/c for assist if needed. Pt completed stair training and is overall improving with independence with mobility. Will continue to follow and progress as able per POC.   Follow Up Recommendations  Home health PT;Supervision/Assistance - 24 hour     Equipment Recommendations  Rolling walker with 5" wheels    Recommendations for Other Services       Precautions / Restrictions Precautions Precautions: Fall Restrictions Weight Bearing Restrictions: No    Mobility  Bed Mobility Overal bed mobility: Needs Assistance Bed Mobility: Supine to Sit;Sit to Supine     Supine to sit: HOB elevated;Supervision Sit to supine: HOB elevated;Supervision   General bed mobility comments: Increased time to complete. Supervision for safety.  Transfers Overall transfer level: Needs assistance Equipment used: Rolling walker (2 wheeled) Transfers: Sit to/from Stand Sit to Stand: Min guard Stand pivot transfers: Min guard       General transfer comment: Increased time to stabilize initial standing balance.  Ambulation/Gait Ambulation/Gait assistance: Min guard Ambulation Distance (Feet): 100 Feet Assistive device: Rolling walker (2 wheeled) Gait Pattern/deviations: Step-through pattern;Decreased stride length;Trunk flexed;Narrow base of support Gait velocity: decreased Gait velocity interpretation: Below normal speed for  age/gender General Gait Details: VC's for improved posture and general safety with the RW. Hands-on assist provided initially however pt progressed to light min gurad/close supervision by end of gait training.    Stairs Stairs: Yes Stairs assistance: Min guard Stair Management: One rail Left;Step to pattern;Forwards Number of Stairs: 10 General stair comments: VC's for sequencing and general safety.   Wheelchair Mobility    Modified Rankin (Stroke Patients Only)       Balance Overall balance assessment: Needs assistance Sitting-balance support: Feet supported;No upper extremity supported Sitting balance-Leahy Scale: Good     Standing balance support: No upper extremity supported;During functional activity Standing balance-Leahy Scale: Poor                      Cognition Arousal/Alertness: Awake/alert   Overall Cognitive Status: No family/caregiver present to determine baseline cognitive functioning Area of Impairment: Memory   Current Attention Level: Selective Memory: Decreased short-term memory              Exercises      General Comments        Pertinent Vitals/Pain Pain Assessment: No/denies pain    Home Living                      Prior Function            PT Goals (current goals can now be found in the care plan section) Acute Rehab PT Goals Patient Stated Goal: home PT Goal Formulation: With patient Time For Goal Achievement: 01/23/16 Potential to Achieve Goals: Fair Progress towards PT goals: Progressing toward goals    Frequency    Min 3X/week      PT Plan Current  plan remains appropriate    Co-evaluation             End of Session Equipment Utilized During Treatment: Gait belt Activity Tolerance: Patient tolerated treatment well Patient left: in bed;with bed alarm set;with call bell/phone within reach     Time: [id number] PT Time Calculation (min) (ACUTE ONLY): 10 min  Charges:  $Gait Training: 8-22  mins                    G Codes:      Thelma Comp 02-04-2016, 12:40 PM   Rolinda Roan, PT, DPT Acute Rehabilitation Services Pager: [phone]

## 2016-01-24 NOTE — Progress Notes (Signed)
Speech Language Pathology Treatment: Cognitive-Linquistic  Patient Details Name: Dustin [name] MRN: [id number] DOB: [birth year] Today's Date: 01/24/2016 Time: [id number] SLP Time Calculation (min) (ACUTE ONLY): 19 min  Assessment / Plan / Recommendation Clinical Impression  Therapy focused primarily on memory and organization strategies. 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 He required moderate cues to recall home medication. Educated pt on importance of continuing to use pill box and calendar to write and refer to important information. He reports his daughter-in-law will write out his checks "because of bad handwriting/" Pt's awareness of deficits is decreased and minimizes his impairments. Pt would benefit from home health ST assessment and treat.    HPI HPI: Pt is a 4 male [facility] with AMS, dysarthria, found to have hyponatremic.  MRI negative.  CXR negative 12/4.  Pt is s/p C3-C7 ACDF.        SLP Plan  Continue with current plan of care     Recommendations                   Oral Care Recommendations: Oral care BID Follow up Recommendations: Home health SLP;24 hour supervision/assistance Plan: Continue with current plan of care       GO                Houston Siren 01/24/2016, 12:23 PM  Orbie Pyo Colvin Caroli.Ed [employer] [phone]

## 2016-01-24 NOTE — Consult Note (Signed)
   [facility] Inpatient Consult   01/24/2016  Dustin [name] Jun 25, 1940 [id number]   Patient screened for potential [facility] services for post hospital follow up needs.  Patient has a history with [facility] in the past.   This is the patient's first admission in the past 6 months.  Dustin [name] is a 75 y.o. male with PMHx of mild axonal peripheral neuropathy, depression, chronic neck and back pain (receives epidural injections), BPH, and GERD who presented to the ED with complaint of gait problem which started on 12/3 at 11 am. Patient states he noticed difficulty walking feeling like he was unable to go the right direction and unsteadiness.  Current disposition is for home with home health.  Patient is eligible for [facility] Management services under patient's Medicare plan.  No current [facility] needs noted at this time.   For questions contact:   Natividad Brood, RN BSN [facility] Liaison  [phone] business mobile phone Toll free office [phone]

## 2016-01-24 NOTE — Progress Notes (Addendum)
PROGRESS NOTE        PATIENT DETAILS Name: Dustin [name] Age: 75 y.o. Sex: male Date of Birth: [birth year] Admit Date: 01/22/2016 Admitting Physician Waldemar Dickens, MD [id number], MD  Brief Narrative: Patient is a 75 y.o. male of mild axonal peripheral neuropathy, depression, chronic neck and back pain (receives epidural injections), polypharmacy, BPH, and GERD presented to the ED with gait instability and hyponatremia. MRI brain and negative for CVA, subsequent MRI of the /cervicalthoracic spine was also negative for acute abnormalities. Slowly improving with just supportive care, neurology following.  Subjective: Wants to go home-thinks is gait is somewhat better than the past few days.  Assessment/Plan: Ataxia: Apparently occurred after suffering a incidence where a large heavy gun safe fell on top of him. Workup including MRI brain/MRI C spines/MRI T-spine are negative. Vitamin B12, TSH-within normal limits, HIV negative. Neurology following-we will await further recommendations. We will attempt to correct his sodium levels to see if his gait improves further. Anticipate discharge home with home health services (deemed not to be a CIR candidate, he refuses SNF)  Hyponatremia: No response to IV fluids over the past few days-he appears euvolemic on exam. 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 Follow electrolytes-if no improvement, may need to adjust his psychotropic medications.  Polypharmacy: Multiple medications with sedation potential-Abilify/Restoril/Trileptal has been discontinued-unfortunately a lot of his other medications cannot be abruptly stopped. Will need close outpatient follow-up with his PCP for further optimization  Anxiety/depression: Seems stable-given  polypharmacy/hyponatremia-we will consult psychiatry for medication management/optimization.  ? Mild dementia: Mostly awake and alert and able to provide most of the history this morning.  GERD: Continue PPI  Peripheral axonal neuropathy: Continue Neurontin  BPH: Continue Flomax  DVT Prophylaxis: Prophylactic Lovenox   Code Status: DNR  Family Communication: Daughter in law at bedside  Disposition Plan: Remain inpatient-Home health services on discharge  Antimicrobial agents: None  Procedures: None  CONSULTS:  neurology  Time spent: 25 minutes-Greater than 50% of this time was spent in counseling, explanation of diagnosis, planning of further management, and coordination of care.  MEDICATIONS: Anti-infectives    None      Scheduled Meds: . aspirin  300 mg Rectal Daily   Or  . aspirin  325 mg Oral Daily  . buPROPion  150 mg Oral Daily  . cholecalciferol  1,000 Units Oral Daily  . finasteride  5 mg Oral Daily  . furosemide  40 mg Intravenous Once  . lamoTRIgine  25 mg Oral Daily  . nortriptyline  50 mg Oral QHS  . oxyCODONE  20 mg Oral Q12H  . pantoprazole  40 mg Oral Daily  . potassium chloride  20 mEq Oral Once  . QUEtiapine  600 mg Oral QHS  . senna-docusate  2 tablet Oral BID  . tamsulosin  0.4 mg Oral Daily  . vitamin B-12  1,000 mcg Oral Daily   Continuous Infusions: PRN Meds:.acetaminophen **OR** acetaminophen, oxyCODONE-acetaminophen **AND** oxyCODONE, polyethylene glycol, senna-docusate   PHYSICAL EXAM: Vital signs: Vitals:   01/23/16 2150 01/24/16 0205 01/24/16 0544 01/24/16 1021  BP: 136/73 (!) 154/74 121/72 116/82  Pulse: 97 (!) 109 99 90  Resp: 20 20 20  17  Temp: 97.7 F (36.5 C) 98.4 F (36.9 C) 97.4 F (36.3 C) 98 F (36.7 C)  TempSrc: Oral Oral Oral Oral  SpO2: 97% 97% 99% 100%  Weight:      Height:       Filed Weights   01/22/16 1212 01/22/16 2055  Weight: 86.2 kg (190 lb) 90.3 kg (199 lb 1.2 oz)   Body mass index is  25.56 kg/m.   General appearance :Awake, alert, not in any distress. Speech Clear. Not toxic Looking Eyes:, pupils equally reactive to light and accomodation,no scleral icterus.Pink conjunctiva HEENT: Atraumatic and Normocephalic Neck: supple, no JVD. No cervical lymphadenopathy. No thyromegaly Resp:Good air entry bilaterally, no added sounds  CVS: S1 S2 regular, no murmurs.  GI: Bowel sounds present, Non tender and not distended with no gaurding, rigidity or rebound.No organomegaly Extremities: B/L Lower Ext shows no edema, both legs are warm to touch Neurology:  speech clear,Non focal, sensation is grossly intact. Psychiatric:  Normal mood. Musculoskeletal:No digital cyanosis Skin:No Rash, warm and dry Wounds:N/A  I have personally reviewed following labs and imaging studies  LABORATORY DATA: CBC:  Recent Labs Lab 01/22/16 1219  WBC 5.3  HGB 10.2*  HCT 29.2*  MCV 90.1  PLT [id number]    Basic Metabolic Panel:  Recent Labs Lab 01/22/16 1219 01/22/16 1907 01/24/16 0334  NA 125* 126* 126*  K 4.2  --  3.7  CL 91*  --  92*  CO2 26  --  24  GLUCOSE 108*  --  101*  BUN 9  --  7  CREATININE 1.06  --  0.89  CALCIUM 9.2  --  8.9  MG  --   --  1.8    GFR: Estimated Creatinine Clearance: 84.7 mL/min (by C-G formula based on SCr of 0.89 mg/dL).  Liver Function Tests:  Recent Labs Lab 01/22/16 1219  AST 24  ALT 15*  ALKPHOS 80  BILITOT 0.7  PROT 6.9  ALBUMIN 4.3   No results for input(s): LIPASE, AMYLASE in the last 168 hours. No results for input(s): AMMONIA in the last 168 hours.  Coagulation Profile:  Recent Labs Lab 01/22/16 1219 01/22/16 1802  INR 1.07 1.08    Cardiac Enzymes:  Recent Labs Lab 01/22/16 1344 01/22/16 1802  TROPONINI <0.03 <0.03    BNP (last 3 results) No results for input(s): PROBNP in the last 8760 hours.  HbA1C:  Recent Labs  01/23/16 0414  HGBA1C 5.5    CBG: No results for input(s): GLUCAP in the last 168  hours.  Lipid Profile:  Recent Labs  01/23/16 0414  CHOL 143  HDL 54  LDLCALC 77  TRIG 60  CHOLHDL 2.6    Thyroid Function Tests:  Recent Labs  01/22/16 1816  TSH 0.929    Anemia Panel:  Recent Labs  01/23/16 1509  VITAMINB12 2,613*  FOLATE 47.2    Urine analysis:    Component Value Date/Time   COLORURINE YELLOW 01/22/2016 1830   APPEARANCEUR CLEAR 01/22/2016 1830   LABSPEC 1.014 01/22/2016 1830   PHURINE 6.5 01/22/2016 1830   GLUCOSEU NEGATIVE 01/22/2016 1830   HGBUR NEGATIVE 01/22/2016 1830   BILIRUBINUR NEGATIVE 01/22/2016 1830   KETONESUR NEGATIVE 01/22/2016 1830   PROTEINUR NEGATIVE 01/22/2016 1830   NITRITE NEGATIVE 01/22/2016 1830   LEUKOCYTESUR NEGATIVE 01/22/2016 1830    Sepsis Labs: Lactic Acid, Venous No results found for: LATICACIDVEN  MICROBIOLOGY: Recent Results (from the past 240 hour(s))  Culture, blood (Routine X 2) w Reflex to ID  Panel     Status: None (Preliminary result)   Collection Time: 01/22/16  5:54 PM  Result Value Ref Range Status   Specimen Description BLOOD RIGHT FOREARM  Final   Special Requests BOTTLES DRAWN AEROBIC ONLY 5CC  Final   Culture NO GROWTH < 24 HOURS  Final   Report Status PENDING  Incomplete  Culture, blood (Routine X 2) w Reflex to ID Panel     Status: None (Preliminary result)   Collection Time: 01/22/16  6:02 PM  Result Value Ref Range Status   Specimen Description BLOOD RIGHT ANTECUBITAL  Final   Special Requests BOTTLES DRAWN AEROBIC ONLY 5CC  Final   Culture NO GROWTH < 24 HOURS  Final   Report Status PENDING  Incomplete  Urine culture     Status: Abnormal   Collection Time: 01/22/16  7:03 PM  Result Value Ref Range Status   Specimen Description URINE, RANDOM  Final   Special Requests NONE  Final   Culture MULTIPLE SPECIES PRESENT, SUGGEST RECOLLECTION (A)  Final   Report Status 01/24/2016 FINAL  Final    RADIOLOGY STUDIES/RESULTS: Dg Chest 2 View  Result Date: 01/22/2016 CLINICAL DATA:   Imbalance while walking today. EXAM: CHEST  2 VIEW COMPARISON:  12/17/2012 CXR FINDINGS: The heart size and mediastinal contours are within normal limits. Minimal atelectasis at the left lung base. No pneumonic consolidation, CHF or pneumothorax. Mild eventration of the right hemidiaphragm. The visualized skeletal structures are unremarkable. Low anterior cervical disc fusion is partially imaged. IMPRESSION: No active cardiopulmonary disease. Electronically Signed   By: Ashley Royalty M.D.   On: 01/22/2016 17:13   Mr Brain Wo Contrast  Result Date: 01/22/2016 CLINICAL DATA:  75 y/o  M; gait problem and slurred speech. EXAM: MRI HEAD WITHOUT CONTRAST MRA HEAD WITHOUT CONTRAST TECHNIQUE: Multiplanar, multiecho pulse sequences of the brain and surrounding structures were obtained without intravenous contrast. Angiographic images of the head were obtained using MRA technique without contrast. COMPARISON:  04/22/2012 MRI of the brain. FINDINGS: MRI HEAD FINDINGS Brain: No acute infarction, hemorrhage, hydrocephalus, extra-axial collection or mass lesion. Mild T2 FLAIR hyperintense signal abnormality in periventricular white matter is compatible with mild chronic microvascular ischemic changes. Mild brain parenchymal volume loss. Vascular: As below. Skull and upper cervical spine: Normal marrow signal. Sinuses/Orbits: Negative. Other: None. MRA HEAD FINDINGS Internal carotid arteries:  Patent. Anterior cerebral arteries:  Patent. Middle cerebral arteries: Patent. Anterior communicating artery: Patent. Posterior communicating arteries:  Patent. Posterior cerebral arteries: Patent. Asymmetry with less left PCA distribution flow related signal relative to the right without appreciable proximal obstructing lesion of uncertain significance, possibly artifactual. Basilar artery:  Patent. Vertebral arteries:  Patent.  Right dominant vertebrobasilar system. No evidence of high-grade stenosis, large vessel occlusion, or aneurysm.  IMPRESSION: 1. No acute intracranial abnormality. 2. Mild chronic microvascular ischemic changes and parenchymal volume loss of the brain. 3. Patent circle of Willis without evidence for high-grade stenosis, large vessel occlusion, or aneurysm. 4. Asymmetric less distal left PCA distribution flow related signal relative to the right PCA without appreciable proximal obstructing lesion of uncertain significance, possibly anatomic variant. Electronically Signed   By: Kristine Garbe M.D.   On: 01/22/2016 20:06   Mr Cervical Spine W Wo Contrast  Result Date: 01/23/2016 CLINICAL DATA:  75 y/o M; gait instability and slurred speech with negative brain MRI. Hyponatremia. EXAM: MRI CERVICAL AND THORACIC SPINE WITHOUT AND WITH CONTRAST TECHNIQUE: Multiplanar and multiecho pulse sequences of the cervical spine, to include  the craniocervical junction and cervicothoracic junction, and thoracic spine, were obtained without and with intravenous contrast. CONTRAST:  77mL MULTIHANCE GADOBENATE DIMEGLUMINE 529 MG/ML IV SOLN COMPARISON:  01/11/2013 cervical radiographs. 01/22/2016 chest radiograph. FINDINGS: MRI CERVICAL SPINE FINDINGS Alignment: Normal cervical lordosis. Stable C7-T1 grade 1 anterolisthesis. Vertebrae: Mild degenerative superior endplate edema at the T3 level. No evidence for discitis, acute fracture, or suspicious osseous lesion. Anterior cervical discectomy and fusion of C3 through C7. The vertebral bodies at the levels are partially obscured by susceptibility artifact from the fusion hardware. Cord: Normal signal and morphology.  No abnormal enhancement. Posterior Fossa, vertebral arteries, paraspinal tissues: Negative. Disc levels: C2-3: Left-sided uncovertebral hypertrophy with mild left foraminal narrowing. No significant canal stenosis. C3-4: Discectomy. No significant foraminal narrowing or canal stenosis. C4-5: Discectomy with left-sided uncovertebral hypertrophy. Mild left foraminal narrowing.  No significant canal stenosis. C5-6: Discectomy with posterior ossific ridging and bilateral uncovertebral/ facet hypertrophy. Effacement of anterior thecal sac with mild canal stenosis. Mild bilateral foraminal narrowing C6-7: Disc osteophyte complex with left-greater-than-right uncovertebral hypertrophy. No significant canal stenosis. Mild bilateral foraminal narrowing. C7-T1: Grade 1 anterolisthesis with uncovered disc and bilateral uncovertebral hypertrophy. Mild bilateral foraminal narrowing. No significant canal stenosis. MRI THORACIC SPINE FINDINGS Segmentation:  Standard. Alignment:  Physiologic. Vertebrae: No fracture, evidence of discitis, or bone lesion. T3 superior endplate mild degenerative edema. No abnormal enhancement of the vertebral bodies. Increased signal within the T1 through T5 vertebral bodies on the sagittal T1 postcontrast sequence is due to incomplete fat suppression. T1/T2 hyperintense focus within the T5 vertebral body measuring 9 mm is compatible with a hemangioma. A similar lesion is present at T9 measuring 20 mm and at T4 measuring 5 mm. Cord: Normal signal and morphology. Paraspinal and other soft tissues: Negative. Disc levels: There is multilevel disc desiccation and mild disc space narrowing. There are small disc protrusions for example at the T9-10 and T10-11 levels. There is no significant canal stenosis or foraminal narrowing IMPRESSION: 1. No acute osseous abnormality or abnormal enhancement of the cervical or thoracic spine. 2. No cord compression, abnormal cord signal, or cord enhancement. 3. Anterior cervical discectomy and fusion from C3 through C7. Stable grade 1 C7-T1 anterolisthesis. Multiple levels of mild foraminal narrowing. No high-grade canal stenosis or foraminal narrowing of the cervical spine. 4. Mild thoracic spondylosis with disc desiccation and tiny protrusions. No significant foraminal narrowing or canal stenosis. Electronically Signed   By: Kristine Garbe M.D.   On: 01/23/2016 21:49   Mr Thoracic Spine W Wo Contrast  Result Date: 01/23/2016 CLINICAL DATA:  75 y/o M; gait instability and slurred speech with negative brain MRI. Hyponatremia. EXAM: MRI CERVICAL AND THORACIC SPINE WITHOUT AND WITH CONTRAST TECHNIQUE: Multiplanar and multiecho pulse sequences of the cervical spine, to include the craniocervical junction and cervicothoracic junction, and thoracic spine, were obtained without and with intravenous contrast. CONTRAST:  13mL MULTIHANCE GADOBENATE DIMEGLUMINE 529 MG/ML IV SOLN COMPARISON:  01/11/2013 cervical radiographs. 01/22/2016 chest radiograph. FINDINGS: MRI CERVICAL SPINE FINDINGS Alignment: Normal cervical lordosis. Stable C7-T1 grade 1 anterolisthesis. Vertebrae: Mild degenerative superior endplate edema at the T3 level. No evidence for discitis, acute fracture, or suspicious osseous lesion. Anterior cervical discectomy and fusion of C3 through C7. The vertebral bodies at the levels are partially obscured by susceptibility artifact from the fusion hardware. Cord: Normal signal and morphology.  No abnormal enhancement. Posterior Fossa, vertebral arteries, paraspinal tissues: Negative. Disc levels: C2-3: Left-sided uncovertebral hypertrophy with mild left foraminal narrowing. No significant canal stenosis.  C3-4: Discectomy. No significant foraminal narrowing or canal stenosis. C4-5: Discectomy with left-sided uncovertebral hypertrophy. Mild left foraminal narrowing. No significant canal stenosis. C5-6: Discectomy with posterior ossific ridging and bilateral uncovertebral/ facet hypertrophy. Effacement of anterior thecal sac with mild canal stenosis. Mild bilateral foraminal narrowing C6-7: Disc osteophyte complex with left-greater-than-right uncovertebral hypertrophy. No significant canal stenosis. Mild bilateral foraminal narrowing. C7-T1: Grade 1 anterolisthesis with uncovered disc and bilateral uncovertebral hypertrophy. Mild  bilateral foraminal narrowing. No significant canal stenosis. MRI THORACIC SPINE FINDINGS Segmentation:  Standard. Alignment:  Physiologic. Vertebrae: No fracture, evidence of discitis, or bone lesion. T3 superior endplate mild degenerative edema. No abnormal enhancement of the vertebral bodies. Increased signal within the T1 through T5 vertebral bodies on the sagittal T1 postcontrast sequence is due to incomplete fat suppression. T1/T2 hyperintense focus within the T5 vertebral body measuring 9 mm is compatible with a hemangioma. A similar lesion is present at T9 measuring 20 mm and at T4 measuring 5 mm. Cord: Normal signal and morphology. Paraspinal and other soft tissues: Negative. Disc levels: There is multilevel disc desiccation and mild disc space narrowing. There are small disc protrusions for example at the T9-10 and T10-11 levels. There is no significant canal stenosis or foraminal narrowing IMPRESSION: 1. No acute osseous abnormality or abnormal enhancement of the cervical or thoracic spine. 2. No cord compression, abnormal cord signal, or cord enhancement. 3. Anterior cervical discectomy and fusion from C3 through C7. Stable grade 1 C7-T1 anterolisthesis. Multiple levels of mild foraminal narrowing. No high-grade canal stenosis or foraminal narrowing of the cervical spine. 4. Mild thoracic spondylosis with disc desiccation and tiny protrusions. No significant foraminal narrowing or canal stenosis. Electronically Signed   By: Kristine Garbe M.D.   On: 01/23/2016 21:49   Mr Jodene Nam Head/brain [id number] Cm  Result Date: 01/22/2016 CLINICAL DATA:  75 y/o  M; gait problem and slurred speech. EXAM: MRI HEAD WITHOUT CONTRAST MRA HEAD WITHOUT CONTRAST TECHNIQUE: Multiplanar, multiecho pulse sequences of the brain and surrounding structures were obtained without intravenous contrast. Angiographic images of the head were obtained using MRA technique without contrast. COMPARISON:  04/22/2012 MRI of the brain.  FINDINGS: MRI HEAD FINDINGS Brain: No acute infarction, hemorrhage, hydrocephalus, extra-axial collection or mass lesion. Mild T2 FLAIR hyperintense signal abnormality in periventricular white matter is compatible with mild chronic microvascular ischemic changes. Mild brain parenchymal volume loss. Vascular: As below. Skull and upper cervical spine: Normal marrow signal. Sinuses/Orbits: Negative. Other: None. MRA HEAD FINDINGS Internal carotid arteries:  Patent. Anterior cerebral arteries:  Patent. Middle cerebral arteries: Patent. Anterior communicating artery: Patent. Posterior communicating arteries:  Patent. Posterior cerebral arteries: Patent. Asymmetry with less left PCA distribution flow related signal relative to the right without appreciable proximal obstructing lesion of uncertain significance, possibly artifactual. Basilar artery:  Patent. Vertebral arteries:  Patent.  Right dominant vertebrobasilar system. No evidence of high-grade stenosis, large vessel occlusion, or aneurysm. IMPRESSION: 1. No acute intracranial abnormality. 2. Mild chronic microvascular ischemic changes and parenchymal volume loss of the brain. 3. Patent circle of Willis without evidence for high-grade stenosis, large vessel occlusion, or aneurysm. 4. Asymmetric less distal left PCA distribution flow related signal relative to the right PCA without appreciable proximal obstructing lesion of uncertain significance, possibly anatomic variant. Electronically Signed   By: Kristine Garbe M.D.   On: 01/22/2016 20:06     LOS: 1 day   Oren Binet, MD  [facility] Pager:[phone]  If 7PM-7AM, please contact night-coverage [email or web address] Password [email or web address] 01/24/2016, 2:10 PM

## 2016-01-24 NOTE — Progress Notes (Signed)
Subjective: Patient states that he feels more stable on his feet today. States that he has been to the bathroom twice.  Exam: Vitals:   01/24/16 0544 01/24/16 1021  BP: 121/72 116/82  Pulse: 99 90  Resp: 20 17  Temp: 97.4 F (36.3 C) 98 F (36.7 C)   Gen: In bed, NAD MS: Patient is alert and oriented follows all commands CN: Cranial nerves II through XII are grossly intact Motor: Bilateral upper extremities and lower extremities show 5/5 strength. I do not note any cogwheeling or increased tone throughout. Sensory: As noted previously sensation is intact. He does have decreased sensation bilaterally in his lower extremities and feet DTR: Deep tendon reflexes are depressed throughout Cerebellar: Minimal end point dysmetria with finger-nose today.  No significant dysmetria with heel-to-shin today Gait: Gait was not tested  Pertinent Labs/Diagnostics: MRI brain and MRA brain:  1. No acute intracranial abnormality. 2. Mild chronic microvascular ischemic changes and parenchymal volume loss of the brain. 3. Patent circle of Willis without evidence for high-grade stenosis, large vessel occlusion, or aneurysm. 4. Asymmetric less distal left PCA distribution flow related signal relative to the right PCA without appreciable proximal obstructing lesion of uncertain significance, possibly anatomic variant.  MRI of cervical and thoracic spine: 1. No acute osseous abnormality or abnormal enhancement of the cervical or thoracic spine. 2. No cord compression, abnormal cord signal, or cord enhancement. 3. Anterior cervical discectomy and fusion from C3 through C7. Stable grade 1 C7-T1 anterolisthesis. Multiple levels of mild foraminal narrowing. No high-grade canal stenosis or foraminal narrowing of the cervical spine. 4. Mild thoracic spondylosis with disc desiccation and tiny protrusions. No significant foraminal narrowing or canal stenosis  NA+ now 126 Magnesium 1.8 RPR  nonreactive Folate 47.2  UDS--Positive for Benzo's and barbiturates which are not in is med rec.      Etta Quill PA-C [facility] [phone]  Of note, the patient tells me that he has tremors at baseline. He also reports that his walking is improving.   Impression: 75 year old gentleman with gait instability. He has intentional > postural tremor. He does appear to push through the target slightly, but its possible this represents more of an intentional-type tremor than dysmetria. His reported on unchanged tremor for a long time would argue for this.  No clear recommendation on imaging. 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 I agree with changing this medication.   Recommendations:  1) continue physical therapy 2) with improving symptoms, I don't think that I would pursue further evaluation at this time. Continue to work with physical therapy and he'll follow-up with a neurologist as an outpatient. 3) neurology sign off, please call with further questions or concerns.   Roland Rack, MD [facility] [phone]  If 7pm- 7am, please page neurology on call as listed in [facility]. 01/24/2016, 12:48 PM

## 2016-01-24 NOTE — Progress Notes (Signed)
  Echocardiogram 2D Echocardiogram has been performed.  Tresa Res 01/24/2016, 2:56 PM

## 2016-01-24 NOTE — Progress Notes (Signed)
OT Cancellation Note  Patient Details Name: Dustin [name] MRN: [id number] DOB: 27-Sep-1940   Cancelled Treatment:    Reason Eval/Treat Not Completed: Patient at procedure or test/ unavailable (ECHO)  Vonita Moss   OTR/L Pager: [phone] Office: [phone] .  01/24/2016, 3:05 PM

## 2016-01-25 LAB — BASIC METABOLIC PANEL
Anion gap: 9 (ref 5–15)
BUN: 7 mg/dL (ref 6–20)
CO2: 30 mmol/L (ref 22–32)
Calcium: 9.6 mg/dL (ref 8.9–10.3)
Chloride: 93 mmol/L — ABNORMAL LOW (ref 101–111)
Creatinine, Ser: 1.07 mg/dL (ref 0.61–1.24)
GFR calc Af Amer: >60 mL/min (ref >60)
GFR calc non Af Amer: >60 mL/min (ref >60)
Glucose, Bld: 111 mg/dL — ABNORMAL HIGH (ref 65–99)
Potassium: 3.9 mmol/L (ref 3.5–5.1)
Sodium: 132 mmol/L — ABNORMAL LOW (ref 135–145)

## 2016-01-25 MED ORDER — FUROSEMIDE 10 MG/ML IJ SOLN: 20.0000 mg | Freq: Once | INTRAMUSCULAR | Status: DC

## 2016-01-25 NOTE — Discharge Summary (Signed)
JAKAVION LOBIANCO [id number] DOB: August 27, 1940 DOA: 01/22/2016  PCP: Lujean Amel, MD  Admit date: 01/22/2016  Discharge date: 01/25/2016  Admitted From: Home  Disposition:  Home   Recommendations for Outpatient Follow-up:   Follow up with PCP in 1-2 weeks  PCP Please obtain BMP/CBC, 2 view CXR in 1week,  (see Discharge instructions)   PCP Please follow up on the following pending results: Follow sodium levels closely   Home Health: PT, Aide  Equipment/Devices: None  Consultations: Neuro Discharge Condition: Stable   CODE STATUS: Full   Diet Recommendation: Diet Heart Healthy Fluid restriction: 1500 mL Fluid total/day   Chief Complaint  Patient presents with  . Gait Problem  . Rash     Brief history of present illness from the day of admission and additional interim summary    Patient is a 75 y.o. male of mild axonal peripheral neuropathy, depression, chronic neck and back pain (receives epidural injections), polypharmacy, BPH, and GERD presented to the ED with gait instability and hyponatremia. MRI brain and negative for CVA, subsequent MRI of the /cervicalthoracic spine was also negative for acute abnormalities. Slowly improving with just supportive care, neurology following.  Hospital issues addressed     Ataxia: Apparently occurred after suffering a incidence where a large heavy gun safe fell on top of him. Workup including MRI brain/MRI C spines/MRI T-spine are negative. Vitamin B12, TSH-within normal limits, HIV negative. Neuro saw the patient and cleared for DC home with Home PT, patient at baseline line, likely some baseline tremor which is stable. He refused placement.   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  Polypharmacy: Multiple medications with sedation potential-Abilify/Restoril/Trileptal has been discontinued-unfortunately a lot of his other medications cannot be abruptly stopped. Will need close outpatient follow-up with his PCP for further optimization  Anxiety/depression: Seems stable-given polypharmacy/hyponatremia- follow with PCP for medication management/optimization.  ? Mild dementia: Mostly awake and alert and able to provide most of the history this morning.  GERD: Continue PPI  Peripheral axonal neuropathy: Continue Neurontin  BPH: Continue Flomax   Discharge diagnosis     Active Problems:   Peripheral axonal neuropathy   Memory loss   Depression   GERD (gastroesophageal reflux disease)   BPH (benign prostatic hyperplasia)   Hyponatremia   Gait instability   Ataxia    Discharge instructions    Discharge Instructions    Discharge instructions    Complete by:  As directed    Follow with Primary MD Lujean Amel, MD in 7 days   Get CBC, CMP, 2 view Chest X ray checked  by Primary MD or SNF MD in 5-7 days ( we routinely change or add medications that can affect your baseline labs and fluid status, therefore we recommend that you get the mentioned basic workup next visit with your PCP, your PCP may decide not to get them or add new tests based on their clinical decision)   Activity: As  tolerated with Full fall precautions use walker/cane & assistance as needed   Disposition Home     Diet:   Diet Heart Healthy with Fluid restriction: 1500 mL /day total.  For Heart failure patients - Check your Weight same time everyday, if you gain over 2 pounds, or you develop in leg swelling, experience more shortness of breath or chest pain, call your Primary MD immediately. Follow Cardiac Low Salt Diet and 1.5 lit/day fluid restriction.   On your next visit with your primary care physician please Get Medicines reviewed and adjusted.   Please  request your Prim.MD to go over all Hospital Tests and Procedure/Radiological results at the follow up, please get all Hospital records sent to your Prim MD by signing hospital release before you go home.   If you experience worsening of your admission symptoms, develop shortness of breath, life threatening emergency, suicidal or homicidal thoughts you must seek medical attention immediately by calling 911 or calling your MD immediately  if symptoms less severe.  You Must read complete instructions/literature along with all the possible adverse reactions/side effects for all the Medicines you take and that have been prescribed to you. Take any new Medicines after you have completely understood and accpet all the possible adverse reactions/side effects.   Do not drive, operate heavy machinery, perform activities at heights, swimming or participation in water activities or provide baby sitting services if your were admitted for syncope or siezures until you have seen by Primary MD or a Neurologist and advised to do so again.  Do not drive when taking Pain medications.    Do not take more than prescribed Pain, Sleep and Anxiety Medications  Special Instructions: If you have smoked or chewed Tobacco  in the last 2 yrs please stop smoking, stop any regular Alcohol  and or any Recreational drug use.  Wear Seat belts while driving.   Please note  You were cared for by a hospitalist during your hospital stay. If you have any questions about your discharge medications or the care you received while you were in the hospital after you are discharged, you can call the unit and asked to speak with the hospitalist on call if the hospitalist that took care of you is not available. Once you are discharged, your primary care physician will handle any further medical issues. Please note that NO REFILLS for any discharge medications will be authorized once you are discharged, as it is imperative that you return to  your primary care physician (or establish a relationship with a primary care physician if you do not have one) for your aftercare needs so that they can reassess your need for medications and monitor your lab values.   Increase activity slowly    Complete by:  As directed       Discharge Medications     Medication List    TAKE these medications   ARIPiprazole 2 MG tablet Commonly known as:  ABILIFY Take 2 mg by mouth daily.   buPROPion 300 MG 24 hr tablet Commonly known as:  WELLBUTRIN XL Take 300 mg by mouth daily.   cholecalciferol 1000 units tablet Commonly known as:  VITAMIN D Take 1,000 Units by mouth daily.   finasteride 5 MG tablet Commonly known as:  PROSCAR Take 5 mg by mouth daily.   lamoTRIgine 100 MG tablet Commonly known as:  LAMICTAL Take 100 mg by mouth daily.   METANX 3-90.314-2-35 MG Caps Take one capsule daily.   nortriptyline 25  MG capsule Commonly known as:  PAMELOR Take 2 capsules (50 mg total) by mouth at bedtime.   OXcarbazepine 150 MG tablet Commonly known as:  TRILEPTAL Take 1 tablet (150 mg total) by mouth 2 (two) times daily.   oxyCODONE-acetaminophen 10-325 MG tablet Commonly known as:  PERCOCET Take 1 tablet by mouth every 6 (six) hours as needed for pain. What changed:  when to take this   OXYCONTIN 20 mg 12 hr tablet Generic drug:  oxyCODONE TAKE 1 TABLET BY ORAL ROUTE EVERY 12 HOURS (DNF 09/24/14)   pantoprazole 40 MG tablet Commonly known as:  PROTONIX Take 40 mg by mouth daily.   polyethylene glycol packet Commonly known as:  MIRALAX / GLYCOLAX Take 17 g by mouth daily as needed for mild constipation.   QUEtiapine 300 MG tablet Commonly known as:  SEROQUEL Take 600 mg by mouth at bedtime. Reported on 05/10/2015   temazepam 15 MG capsule Commonly known as:  RESTORIL Take 15 mg by mouth at bedtime.   VIIBRYD 40 MG Tabs Generic drug:  Vilazodone HCl Take 40 mg by mouth daily.   vitamin B-12 1000 MCG tablet Commonly  known as:  CYANOCOBALAMIN Take 1,000 mcg by mouth daily.       Follow-up [place], MD Follow up in 1 week(s).   Specialty:  Family Medicine Why:  hospital discharge follow up, repeat sodium level Contact information: [address] [place] [address] [phone]           Major procedures and Radiology Reports - PLEASE review detailed and final reports thoroughly  -        Dg Chest 2 View  Result Date: 01/22/2016 CLINICAL DATA:  Imbalance while walking today. EXAM: CHEST  2 VIEW COMPARISON:  12/17/2012 CXR FINDINGS: The heart size and mediastinal contours are within normal limits. Minimal atelectasis at the left lung base. No pneumonic consolidation, CHF or pneumothorax. Mild eventration of the right hemidiaphragm. The visualized skeletal structures are unremarkable. Low anterior cervical disc fusion is partially imaged. IMPRESSION: No active cardiopulmonary disease. Electronically Signed   By: Ashley Royalty M.D.   On: 01/22/2016 17:13   Mr Brain Wo Contrast  Result Date: 01/22/2016 CLINICAL DATA:  75 y/o  M; gait problem and slurred speech. EXAM: MRI HEAD WITHOUT CONTRAST MRA HEAD WITHOUT CONTRAST TECHNIQUE: Multiplanar, multiecho pulse sequences of the brain and surrounding structures were obtained without intravenous contrast. Angiographic images of the head were obtained using MRA technique without contrast. COMPARISON:  04/22/2012 MRI of the brain. FINDINGS: MRI HEAD FINDINGS Brain: No acute infarction, hemorrhage, hydrocephalus, extra-axial collection or mass lesion. Mild T2 FLAIR hyperintense signal abnormality in periventricular white matter is compatible with mild chronic microvascular ischemic changes. Mild brain parenchymal volume loss. Vascular: As below. Skull and upper cervical spine: Normal marrow signal. Sinuses/Orbits: Negative. Other: None. MRA HEAD FINDINGS Internal carotid arteries:  Patent. Anterior cerebral arteries:   Patent. Middle cerebral arteries: Patent. Anterior communicating artery: Patent. Posterior communicating arteries:  Patent. Posterior cerebral arteries: Patent. Asymmetry with less left PCA distribution flow related signal relative to the right without appreciable proximal obstructing lesion of uncertain significance, possibly artifactual. Basilar artery:  Patent. Vertebral arteries:  Patent.  Right dominant vertebrobasilar system. No evidence of high-grade stenosis, large vessel occlusion, or aneurysm. IMPRESSION: 1. No acute intracranial abnormality. 2. Mild chronic microvascular ischemic changes and parenchymal volume loss of the brain. 3. Patent circle of Willis without evidence for high-grade stenosis, large vessel  occlusion, or aneurysm. 4. Asymmetric less distal left PCA distribution flow related signal relative to the right PCA without appreciable proximal obstructing lesion of uncertain significance, possibly anatomic variant. Electronically Signed   By: Kristine Garbe M.D.   On: 01/22/2016 20:06   Mr Cervical Spine W Wo Contrast  Result Date: 01/23/2016 CLINICAL DATA:  75 y/o M; gait instability and slurred speech with negative brain MRI. Hyponatremia. EXAM: MRI CERVICAL AND THORACIC SPINE WITHOUT AND WITH CONTRAST TECHNIQUE: Multiplanar and multiecho pulse sequences of the cervical spine, to include the craniocervical junction and cervicothoracic junction, and thoracic spine, were obtained without and with intravenous contrast. CONTRAST:  43mL MULTIHANCE GADOBENATE DIMEGLUMINE 529 MG/ML IV SOLN COMPARISON:  01/11/2013 cervical radiographs. 01/22/2016 chest radiograph. FINDINGS: MRI CERVICAL SPINE FINDINGS Alignment: Normal cervical lordosis. Stable C7-T1 grade 1 anterolisthesis. Vertebrae: Mild degenerative superior endplate edema at the T3 level. No evidence for discitis, acute fracture, or suspicious osseous lesion. Anterior cervical discectomy and fusion of C3 through C7. The vertebral  bodies at the levels are partially obscured by susceptibility artifact from the fusion hardware. Cord: Normal signal and morphology.  No abnormal enhancement. Posterior Fossa, vertebral arteries, paraspinal tissues: Negative. Disc levels: C2-3: Left-sided uncovertebral hypertrophy with mild left foraminal narrowing. No significant canal stenosis. C3-4: Discectomy. No significant foraminal narrowing or canal stenosis. C4-5: Discectomy with left-sided uncovertebral hypertrophy. Mild left foraminal narrowing. No significant canal stenosis. C5-6: Discectomy with posterior ossific ridging and bilateral uncovertebral/ facet hypertrophy. Effacement of anterior thecal sac with mild canal stenosis. Mild bilateral foraminal narrowing C6-7: Disc osteophyte complex with left-greater-than-right uncovertebral hypertrophy. No significant canal stenosis. Mild bilateral foraminal narrowing. C7-T1: Grade 1 anterolisthesis with uncovered disc and bilateral uncovertebral hypertrophy. Mild bilateral foraminal narrowing. No significant canal stenosis. MRI THORACIC SPINE FINDINGS Segmentation:  Standard. Alignment:  Physiologic. Vertebrae: No fracture, evidence of discitis, or bone lesion. T3 superior endplate mild degenerative edema. No abnormal enhancement of the vertebral bodies. Increased signal within the T1 through T5 vertebral bodies on the sagittal T1 postcontrast sequence is due to incomplete fat suppression. T1/T2 hyperintense focus within the T5 vertebral body measuring 9 mm is compatible with a hemangioma. A similar lesion is present at T9 measuring 20 mm and at T4 measuring 5 mm. Cord: Normal signal and morphology. Paraspinal and other soft tissues: Negative. Disc levels: There is multilevel disc desiccation and mild disc space narrowing. There are small disc protrusions for example at the T9-10 and T10-11 levels. There is no significant canal stenosis or foraminal narrowing IMPRESSION: 1. No acute osseous abnormality or  abnormal enhancement of the cervical or thoracic spine. 2. No cord compression, abnormal cord signal, or cord enhancement. 3. Anterior cervical discectomy and fusion from C3 through C7. Stable grade 1 C7-T1 anterolisthesis. Multiple levels of mild foraminal narrowing. No high-grade canal stenosis or foraminal narrowing of the cervical spine. 4. Mild thoracic spondylosis with disc desiccation and tiny protrusions. No significant foraminal narrowing or canal stenosis. Electronically Signed   By: Kristine Garbe M.D.   On: 01/23/2016 21:49   Mr Thoracic Spine W Wo Contrast  Result Date: 01/23/2016 CLINICAL DATA:  75 y/o M; gait instability and slurred speech with negative brain MRI. Hyponatremia. EXAM: MRI CERVICAL AND THORACIC SPINE WITHOUT AND WITH CONTRAST TECHNIQUE: Multiplanar and multiecho pulse sequences of the cervical spine, to include the craniocervical junction and cervicothoracic junction, and thoracic spine, were obtained without and with intravenous contrast. CONTRAST:  59mL MULTIHANCE GADOBENATE DIMEGLUMINE 529 MG/ML IV SOLN COMPARISON:  01/11/2013 cervical radiographs.  01/22/2016 chest radiograph. FINDINGS: MRI CERVICAL SPINE FINDINGS Alignment: Normal cervical lordosis. Stable C7-T1 grade 1 anterolisthesis. Vertebrae: Mild degenerative superior endplate edema at the T3 level. No evidence for discitis, acute fracture, or suspicious osseous lesion. Anterior cervical discectomy and fusion of C3 through C7. The vertebral bodies at the levels are partially obscured by susceptibility artifact from the fusion hardware. Cord: Normal signal and morphology.  No abnormal enhancement. Posterior Fossa, vertebral arteries, paraspinal tissues: Negative. Disc levels: C2-3: Left-sided uncovertebral hypertrophy with mild left foraminal narrowing. No significant canal stenosis. C3-4: Discectomy. No significant foraminal narrowing or canal stenosis. C4-5: Discectomy with left-sided uncovertebral hypertrophy.  Mild left foraminal narrowing. No significant canal stenosis. C5-6: Discectomy with posterior ossific ridging and bilateral uncovertebral/ facet hypertrophy. Effacement of anterior thecal sac with mild canal stenosis. Mild bilateral foraminal narrowing C6-7: Disc osteophyte complex with left-greater-than-right uncovertebral hypertrophy. No significant canal stenosis. Mild bilateral foraminal narrowing. C7-T1: Grade 1 anterolisthesis with uncovered disc and bilateral uncovertebral hypertrophy. Mild bilateral foraminal narrowing. No significant canal stenosis. MRI THORACIC SPINE FINDINGS Segmentation:  Standard. Alignment:  Physiologic. Vertebrae: No fracture, evidence of discitis, or bone lesion. T3 superior endplate mild degenerative edema. No abnormal enhancement of the vertebral bodies. Increased signal within the T1 through T5 vertebral bodies on the sagittal T1 postcontrast sequence is due to incomplete fat suppression. T1/T2 hyperintense focus within the T5 vertebral body measuring 9 mm is compatible with a hemangioma. A similar lesion is present at T9 measuring 20 mm and at T4 measuring 5 mm. Cord: Normal signal and morphology. Paraspinal and other soft tissues: Negative. Disc levels: There is multilevel disc desiccation and mild disc space narrowing. There are small disc protrusions for example at the T9-10 and T10-11 levels. There is no significant canal stenosis or foraminal narrowing IMPRESSION: 1. No acute osseous abnormality or abnormal enhancement of the cervical or thoracic spine. 2. No cord compression, abnormal cord signal, or cord enhancement. 3. Anterior cervical discectomy and fusion from C3 through C7. Stable grade 1 C7-T1 anterolisthesis. Multiple levels of mild foraminal narrowing. No high-grade canal stenosis or foraminal narrowing of the cervical spine. 4. Mild thoracic spondylosis with disc desiccation and tiny protrusions. No significant foraminal narrowing or canal stenosis. Electronically  Signed   By: Kristine Garbe M.D.   On: 01/23/2016 21:49   Mr Jodene Nam Head/brain [id number] Cm  Result Date: 01/22/2016 CLINICAL DATA:  75 y/o  M; gait problem and slurred speech. EXAM: MRI HEAD WITHOUT CONTRAST MRA HEAD WITHOUT CONTRAST TECHNIQUE: Multiplanar, multiecho pulse sequences of the brain and surrounding structures were obtained without intravenous contrast. Angiographic images of the head were obtained using MRA technique without contrast. COMPARISON:  04/22/2012 MRI of the brain. FINDINGS: MRI HEAD FINDINGS Brain: No acute infarction, hemorrhage, hydrocephalus, extra-axial collection or mass lesion. Mild T2 FLAIR hyperintense signal abnormality in periventricular white matter is compatible with mild chronic microvascular ischemic changes. Mild brain parenchymal volume loss. Vascular: As below. Skull and upper cervical spine: Normal marrow signal. Sinuses/Orbits: Negative. Other: None. MRA HEAD FINDINGS Internal carotid arteries:  Patent. Anterior cerebral arteries:  Patent. Middle cerebral arteries: Patent. Anterior communicating artery: Patent. Posterior communicating arteries:  Patent. Posterior cerebral arteries: Patent. Asymmetry with less left PCA distribution flow related signal relative to the right without appreciable proximal obstructing lesion of uncertain significance, possibly artifactual. Basilar artery:  Patent. Vertebral arteries:  Patent.  Right dominant vertebrobasilar system. No evidence of high-grade stenosis, large vessel occlusion, or aneurysm. IMPRESSION: 1. No acute intracranial abnormality. 2. Mild chronic  microvascular ischemic changes and parenchymal volume loss of the brain. 3. Patent circle of Willis without evidence for high-grade stenosis, large vessel occlusion, or aneurysm. 4. Asymmetric less distal left PCA distribution flow related signal relative to the right PCA without appreciable proximal obstructing lesion of uncertain significance, possibly anatomic variant.  Electronically Signed   By: Kristine Garbe M.D.   On: 01/22/2016 20:06    Micro Results     Recent Results (from the past 240 hour(s))  Culture, blood (Routine X 2) w Reflex to ID Panel     Status: None (Preliminary result)   Collection Time: 01/22/16  5:54 PM  Result Value Ref Range Status   Specimen Description BLOOD RIGHT FOREARM  Final   Special Requests BOTTLES DRAWN AEROBIC ONLY 5CC  Final   Culture NO GROWTH 3 DAYS  Final   Report Status PENDING  Incomplete  Culture, blood (Routine X 2) w Reflex to ID Panel     Status: None (Preliminary result)   Collection Time: 01/22/16  6:02 PM  Result Value Ref Range Status   Specimen Description BLOOD RIGHT ANTECUBITAL  Final   Special Requests BOTTLES DRAWN AEROBIC ONLY 5CC  Final   Culture NO GROWTH 3 DAYS  Final   Report Status PENDING  Incomplete  Urine culture     Status: Abnormal   Collection Time: 01/22/16  7:03 PM  Result Value Ref Range Status   Specimen Description URINE, RANDOM  Final   Special Requests NONE  Final   Culture MULTIPLE SPECIES PRESENT, SUGGEST RECOLLECTION (A)  Final   Report Status 01/24/2016 FINAL  Final    Today   Subjective    Traci Sermon today has no headache,no chest abdominal pain,no new weakness tingling or numbness, feels much better wants to go home today.     Objective   Blood pressure 128/60, pulse 95, temperature 98.4 F (36.9 C), temperature source Oral, resp. rate 18, height 6\' 2"  (1.88 m), weight 90.3 kg (199 lb 1.2 oz), SpO2 100 %.   Intake/Output Summary (Last 24 hours) at 01/25/16 1059 Last data filed at 01/25/16 0419  Gross per 24 hour  Intake              480 ml  Output                0 ml  Net              480 ml    Exam Awake Alert, Oriented x 3, No new F.N deficits, Normal affect [place].AT,PERRAL Supple Neck,No JVD, No cervical lymphadenopathy appriciated.  Symmetrical Chest wall movement, Good air movement bilaterally, CTAB RRR,No Gallops,Rubs or new  Murmurs, No Parasternal Heave +ve B.Sounds, Abd Soft, Non tender, No organomegaly appriciated, No rebound -guarding or rigidity. No Cyanosis, Clubbing or edema, No new Rash or bruise   Data Review   CBC w Diff: Lab Results  Component Value Date   WBC 5.3 01/22/2016   HGB 10.2 (L) 01/22/2016   HCT 29.2 (L) 01/22/2016   HCT 31.0 (L) 01/30/2015   PLT 166 01/22/2016   PLT 178 01/30/2015    CMP: Lab Results  Component Value Date   NA 132 (L) 01/25/2016   NA 128 (L) 01/30/2015   K 3.9 01/25/2016   CL 93 (L) 01/25/2016   CO2 30 01/25/2016   BUN 7 01/25/2016   BUN 18 01/30/2015   CREATININE 1.07 01/25/2016   PROT 6.9 01/22/2016   PROT 6.3 01/30/2015  ALBUMIN 4.3 01/22/2016   ALBUMIN 4.2 01/30/2015   BILITOT 0.7 01/22/2016   BILITOT 0.4 01/30/2015   ALKPHOS 80 01/22/2016   AST 24 01/22/2016   ALT 15 (L) 01/22/2016  .   Total Time in preparing paper work, data evaluation and todays exam - 35 minutes  Thurnell Lose M.D on 01/25/2016 at [facility]  [phone]

## 2016-01-25 NOTE — Care Management Note (Signed)
Case Management Note  Patient Details  Name: Dustin [name] MRN: [id number] Date of Birth: 07/29/1940  Subjective/Objective:                    Action/Plan: Pt discharging home today with orders for [facility] services and DME. CM met with the patient and provided him a list of [facility] agencies in [place]. He selected [facility]. Drew with [facility] notified and accepted the referral. Pt with orders for walker. Larene Beach with [facility] D[facility] notified and will deliver the equipment to the room.   Expected Discharge Date:                  Expected Discharge Plan:  [place]  In-House Referral:     Discharge planning Services  CM Consult  Post Acute Care Choice:  Durable Medical Equipment, Home Health Choice offered to:  Patient  DME Arranged:  Walker rolling DME Agency:  [place] Arranged:  PT, Nurse's Aide [place] Agency:  [place]  Status of Service:  Completed, signed off  If discussed at [place] of Stay Meetings, dates discussed:    Additional Comments:  Pollie Friar, RN 01/25/2016, 11:32 AM

## 2016-01-25 NOTE — Progress Notes (Signed)
Chart monitored and note that patient with improving symptoms and no further evaluations recommended. He is currently close to baseline and agree with recommendations for follow up HHPT after discharge. Will sign off at this time.

## 2016-01-25 NOTE — Progress Notes (Signed)
Patient is being d/c home. D/c instructions given and patient verbalized understanding. 

## 2016-01-25 NOTE — Discharge Instructions (Signed)
Follow with Primary MD Lujean Amel, MD in 7 days   Get CBC, CMP, 2 view Chest X ray checked  by Primary MD or SNF MD in 5-7 days ( we routinely change or add medications that can affect your baseline labs and fluid status, therefore we recommend that you get the mentioned basic workup next visit with your PCP, your PCP may decide not to get them or add new tests based on their clinical decision)   Activity: As tolerated with Full fall precautions use walker/cane & assistance as needed   Disposition Home     Diet:   Diet Heart Healthy with Fluid restriction: 1500 mL /day total.  For Heart failure patients - Check your Weight same time everyday, if you gain over 2 pounds, or you develop in leg swelling, experience more shortness of breath or chest pain, call your Primary MD immediately. Follow Cardiac Low Salt Diet and 1.5 lit/day fluid restriction.   On your next visit with your primary care physician please Get Medicines reviewed and adjusted.   Please request your Prim.MD to go over all Hospital Tests and Procedure/Radiological results at the follow up, please get all Hospital records sent to your Prim MD by signing hospital release before you go home.   If you experience worsening of your admission symptoms, develop shortness of breath, life threatening emergency, suicidal or homicidal thoughts you must seek medical attention immediately by calling 911 or calling your MD immediately  if symptoms less severe.  You Must read complete instructions/literature along with all the possible adverse reactions/side effects for all the Medicines you take and that have been prescribed to you. Take any new Medicines after you have completely understood and accpet all the possible adverse reactions/side effects.   Do not drive, operate heavy machinery, perform activities at heights, swimming or participation in water activities or provide baby sitting services if your were admitted for syncope or  siezures until you have seen by Primary MD or a Neurologist and advised to do so again.  Do not drive when taking Pain medications.    Do not take more than prescribed Pain, Sleep and Anxiety Medications  Special Instructions: If you have smoked or chewed Tobacco  in the last 2 yrs please stop smoking, stop any regular Alcohol  and or any Recreational drug use.  Wear Seat belts while driving.   Please note  You were cared for by a hospitalist during your hospital stay. If you have any questions about your discharge medications or the care you received while you were in the hospital after you are discharged, you can call the unit and asked to speak with the hospitalist on call if the hospitalist that took care of you is not available. Once you are discharged, your primary care physician will handle any further medical issues. Please note that NO REFILLS for any discharge medications will be authorized once you are discharged, as it is imperative that you return to your primary care physician (or establish a relationship with a primary care physician if you do not have one) for your aftercare needs so that they can reassess your need for medications and monitor your lab values.

## 2016-01-27 LAB — CULTURE, BLOOD (ROUTINE X 2)
Culture: NO GROWTH
Culture: NO GROWTH

## 2016-01-30 ENCOUNTER — Encounter (HOSPITAL_COMMUNITY): Payer: Self-pay | Admitting: Emergency Medicine

## 2016-01-30 ENCOUNTER — Emergency Department (HOSPITAL_COMMUNITY): Payer: Medicare Other

## 2016-01-30 ENCOUNTER — Emergency Department (HOSPITAL_COMMUNITY)
Admission: EM | Admit: 2016-01-30 | Discharge: 2016-01-30 | Disposition: A | Discharge status: Home / Self Care | Payer: Medicare Other | Source: Home / Self Care | Attending: Emergency Medicine | Admitting: Emergency Medicine

## 2016-01-30 ENCOUNTER — Encounter (HOSPITAL_COMMUNITY): Payer: Self-pay

## 2016-01-30 ENCOUNTER — Emergency Department (HOSPITAL_COMMUNITY)
Admission: EM | Admit: 2016-01-30 | Discharge: 2016-01-31 | Disposition: A | Discharge status: Home / Self Care | Payer: Medicare Other | Attending: Family Medicine | Admitting: Family Medicine

## 2016-01-30 DIAGNOSIS — R531 Weakness: Secondary | ICD-10-CM | POA: Diagnosis not present

## 2016-01-30 DIAGNOSIS — F410 Panic disorder [episodic paroxysmal anxiety] without agoraphobia: Secondary | ICD-10-CM

## 2016-01-30 DIAGNOSIS — Z87891 Personal history of nicotine dependence: Secondary | ICD-10-CM | POA: Insufficient documentation

## 2016-01-30 DIAGNOSIS — Z5181 Encounter for therapeutic drug level monitoring: Secondary | ICD-10-CM | POA: Insufficient documentation

## 2016-01-30 DIAGNOSIS — R42 Dizziness and giddiness: Secondary | ICD-10-CM

## 2016-01-30 DIAGNOSIS — G251 Drug-induced tremor: Secondary | ICD-10-CM

## 2016-01-30 DIAGNOSIS — R404 Transient alteration of awareness: Secondary | ICD-10-CM | POA: Diagnosis not present

## 2016-01-30 DIAGNOSIS — Z79899 Other long term (current) drug therapy: Secondary | ICD-10-CM | POA: Insufficient documentation

## 2016-01-30 DIAGNOSIS — R251 Tremor, unspecified: Secondary | ICD-10-CM | POA: Diagnosis not present

## 2016-01-30 DIAGNOSIS — R4781 Slurred speech: Secondary | ICD-10-CM | POA: Diagnosis not present

## 2016-01-30 DIAGNOSIS — R29818 Other symptoms and signs involving the nervous system: Secondary | ICD-10-CM | POA: Diagnosis not present

## 2016-01-30 LAB — CBC WITH DIFFERENTIAL/PLATELET
Basophils Absolute: 0 10*3/uL (ref 0.0–0.1)
Basophils Relative: 0 %
Eosinophils Absolute: 0.2 10*3/uL (ref 0.0–0.7)
Eosinophils Relative: 3 %
HCT: 31.2 % — ABNORMAL LOW (ref 39.0–52.0)
Hemoglobin: 10.8 g/dL — ABNORMAL LOW (ref 13.0–17.0)
Lymphocytes Relative: 7 %
Lymphs Abs: 0.7 10*3/uL (ref 0.7–4.0)
MCH: 31.6 pg (ref 26.0–34.0)
MCHC: 34.6 g/dL (ref 30.0–36.0)
MCV: 91.2 fL (ref 78.0–100.0)
Monocytes Absolute: 0.6 10*3/uL (ref 0.1–1.0)
Monocytes Relative: 6 %
Neutro Abs: 8.1 10*3/uL — ABNORMAL HIGH (ref 1.7–7.7)
Neutrophils Relative %: 84 %
Platelets: 138 10*3/uL — ABNORMAL LOW (ref 150–400)
RBC: 3.42 MIL/uL — ABNORMAL LOW (ref 4.22–5.81)
RDW: 13.4 % (ref 11.5–15.5)
WBC: 9.6 10*3/uL (ref 4.0–10.5)

## 2016-01-30 LAB — PROTIME-INR
INR: 1.13
Prothrombin Time: 14.6 seconds (ref 11.4–15.2)

## 2016-01-30 LAB — COMPREHENSIVE METABOLIC PANEL
ALT: 18 U/L (ref 17–63)
ALT: 15 U/L — ABNORMAL LOW (ref 17–63)
AST: 24 U/L (ref 15–41)
AST: 22 U/L (ref 15–41)
Albumin: 4.2 g/dL (ref 3.5–5.0)
Albumin: 4 g/dL (ref 3.5–5.0)
Alkaline Phosphatase: 75 U/L (ref 38–126)
Alkaline Phosphatase: 71 U/L (ref 38–126)
Anion gap: 9 (ref 5–15)
Anion gap: 7 (ref 5–15)
BUN: 12 mg/dL (ref 6–20)
BUN: 11 mg/dL (ref 6–20)
CO2: 27 mmol/L (ref 22–32)
CO2: 26 mmol/L (ref 22–32)
Calcium: 9.1 mg/dL (ref 8.9–10.3)
Calcium: 9.1 mg/dL (ref 8.9–10.3)
Chloride: 96 mmol/L — ABNORMAL LOW (ref 101–111)
Chloride: 98 mmol/L — ABNORMAL LOW (ref 101–111)
Creatinine, Ser: 1.04 mg/dL (ref 0.61–1.24)
Creatinine, Ser: 0.94 mg/dL (ref 0.61–1.24)
GFR calc Af Amer: >60 mL/min (ref >60)
GFR calc Af Amer: >60 mL/min (ref >60)
GFR calc non Af Amer: >60 mL/min (ref >60)
GFR calc non Af Amer: >60 mL/min (ref >60)
Glucose, Bld: 113 mg/dL — ABNORMAL HIGH (ref 65–99)
Glucose, Bld: 112 mg/dL — ABNORMAL HIGH (ref 65–99)
Potassium: 3.9 mmol/L (ref 3.5–5.1)
Potassium: 3.8 mmol/L (ref 3.5–5.1)
Sodium: 132 mmol/L — ABNORMAL LOW (ref 135–145)
Sodium: 131 mmol/L — ABNORMAL LOW (ref 135–145)
Total Bilirubin: 0.6 mg/dL (ref 0.3–1.2)
Total Bilirubin: 0.8 mg/dL (ref 0.3–1.2)
Total Protein: 6.1 g/dL — ABNORMAL LOW (ref 6.5–8.1)
Total Protein: 6 g/dL — ABNORMAL LOW (ref 6.5–8.1)

## 2016-01-30 LAB — DIFFERENTIAL
Basophils Absolute: 0 10*3/uL (ref 0.0–0.1)
Basophils Relative: 0 %
Eosinophils Absolute: 0.2 10*3/uL (ref 0.0–0.7)
Eosinophils Relative: 3 %
Lymphocytes Relative: 13 %
Lymphs Abs: 0.7 10*3/uL (ref 0.7–4.0)
Monocytes Absolute: 0.5 10*3/uL (ref 0.1–1.0)
Monocytes Relative: 9 %
Neutro Abs: 4.2 10*3/uL (ref 1.7–7.7)
Neutrophils Relative %: 75 %

## 2016-01-30 LAB — I-STAT CHEM 8, ED
BUN: 12 mg/dL (ref 6–20)
Calcium, Ion: 1.11 mmol/L — ABNORMAL LOW (ref 1.15–1.40)
Chloride: 92 mmol/L — ABNORMAL LOW (ref 101–111)
Creatinine, Ser: 0.9 mg/dL (ref 0.61–1.24)
Glucose, Bld: 108 mg/dL — ABNORMAL HIGH (ref 65–99)
HCT: 26 % — ABNORMAL LOW (ref 39.0–52.0)
Hemoglobin: 8.8 g/dL — ABNORMAL LOW (ref 13.0–17.0)
Potassium: 3.7 mmol/L (ref 3.5–5.1)
Sodium: 132 mmol/L — ABNORMAL LOW (ref 135–145)
TCO2: 25 mmol/L (ref 0–100)

## 2016-01-30 LAB — CBC
HCT: 27.5 % — ABNORMAL LOW (ref 39.0–52.0)
Hemoglobin: 9.6 g/dL — ABNORMAL LOW (ref 13.0–17.0)
MCH: 31.7 pg (ref 26.0–34.0)
MCHC: 34.9 g/dL (ref 30.0–36.0)
MCV: 90.8 fL (ref 78.0–100.0)
Platelets: 124 10*3/uL — ABNORMAL LOW (ref 150–400)
RBC: 3.03 MIL/uL — ABNORMAL LOW (ref 4.22–5.81)
RDW: 13.3 % (ref 11.5–15.5)
WBC: 5.6 10*3/uL (ref 4.0–10.5)

## 2016-01-30 LAB — I-STAT TROPONIN, ED: Troponin i, poc: 0 ng/mL (ref 0.00–0.08)

## 2016-01-30 LAB — CBG MONITORING, ED: Glucose-Capillary: 103 mg/dL — ABNORMAL HIGH (ref 65–99)

## 2016-01-30 LAB — APTT: aPTT: 37 seconds — ABNORMAL HIGH (ref 24–36)

## 2016-01-30 NOTE — ED Notes (Signed)
PA Hedges assisted patient to ambulate to restroom.

## 2016-01-30 NOTE — ED Notes (Signed)
Pt reporting he needs to urinate, requesting to walk to restroom, this RN advised patient that since he is here for dizziness it would be more appropriate for him to use a urinal. Pt stated "I wont fall if I walk and I dont think I will be able to pee in a urinal but I will try." pt provided a urinal and instructed that for safety reasons he should not get out of bed without RN or tech in room

## 2016-01-30 NOTE — ED Notes (Signed)
Patient transported to CT 

## 2016-01-30 NOTE — Discharge Instructions (Signed)
Please read attached information. If you experience any new or worsening signs or symptoms please return to the emergency room for evaluation. Please follow-up with your primary care provider or specialist as discussed.  °

## 2016-01-30 NOTE — ED Notes (Signed)
EDP at bedside  

## 2016-01-30 NOTE — ED Notes (Signed)
Pt requested to use the restroom, but refuses to use urinal or bedside commode. Pt states he is able to ambulate and says "only way" he can go "is on a toilet." Placed treaded socks on pt and provided standby assist as pt stood from bed under own power. Pt ambulated without difficulty to restroom. Denies any weakness, dizziness.

## 2016-01-30 NOTE — ED Provider Notes (Signed)
[facility] DEPT Provider Note   CSN: [id number] Arrival date & time: 01/30/16  1857     History   Chief Complaint No chief complaint on file.   HPI AARIAN FLEWELLEN is a 75 y.o. male.  HPI Patient is a 75 year old male with past medical history of neuropathy, chronic neck pain, BPH, hyponatremia secondary to SIADH who presents with slurred speech. Of note he was evaluated this morning for symptoms of dizziness and leg tremors. He is medically cleared and discharged home. Patient reports around 5 PM he began to feel as if his speech was slurred. He called EMS and presented again to the emergency department for evaluation. Of note patient was admitted recently last week for similar complaints of dizziness, gait instability and speech difficulties. During this admission, he had a workup including MRI brain, MRI C-spine, MRI T-spine, vitamin B12, TSH, RPR and HIV testing that were all negative.   Past Medical History:  Diagnosis Date  . BPH (benign prostatic hyperplasia)   . GERD (gastroesophageal reflux disease)    protonix for control  . Insomnia   . Neck pain   . Neuropathy [facility])     Patient Active Problem List   Diagnosis Date Noted  . Ataxia   . Hyponatremia 01/22/2016  . Gait instability 01/22/2016  . Urinary retention   . Stroke-like symptom   . Syncope 05/02/2015  . Closed left fibular fracture 05/02/2015  . GERD (gastroesophageal reflux disease) 05/02/2015  . BPH (benign prostatic hyperplasia) 05/02/2015  . Fracture of distal end of left fibula   . 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 Depression 01/30/2015  . 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 ANTERIOR CERVICAL DECOMPRESSION/DISCECTOMY FUSION 4 LEVELS N/A 10/27/2012   Procedure: Cervical Three-Four Cervical Four-Five Cervical Five-Six Cervical Six-Seven  Anterior cervical decompression/diskectomy/fusion;  Surgeon: Erline Levine, MD;  Location: [facility];   Service: Neurosurgery;  Laterality: N/A;  Cervical Three-Four Cervical Four-Five Cervical Five-Six Cervical Six-Seven  Anterior cervical decompression/diskectomy/fusion  . SHOULDER ARTHROSCOPY  2010   rt shoulder  . SHOULDER ARTHROSCOPY  02/15/2011   Procedure: ARTHROSCOPY SHOULDER;  Surgeon: Cynda Familia;  Location: [facility];  Service: Orthopedics;  Laterality: Right;  Debridement  . TONSILLECTOMY         Home Medications    Prior to Admission medications   Medication Sig Start Date End Date Taking? Authorizing Provider  ARIPiprazole (ABILIFY) 2 MG tablet Take 2 mg by mouth daily.    Historical Provider, MD  buPROPion (WELLBUTRIN XL) 300 MG 24 hr tablet Take 300 mg by mouth daily.    Historical Provider, MD  cholecalciferol (VITAMIN D) 1000 UNITS tablet Take 1,000 Units by mouth daily.     Historical Provider, MD  finasteride (PROSCAR) 5 MG tablet Take 5 mg by mouth daily.      Historical Provider, MD  L-Methylfolate-Algae-B12-B6 Glade Stanford) 3-90.314-2-35 MG CAPS Take one capsule daily. 11/02/15   Marcial Pacas, MD  lamoTRIgine (LAMICTAL) 100 MG tablet Take 100 mg by mouth daily.    Historical Provider, MD  nortriptyline (PAMELOR) 25 MG capsule Take 2 capsules (50 mg total) by mouth at bedtime. 09/25/15   Dennie Bible, NP  OXcarbazepine (TRILEPTAL) 150 MG tablet Take 1 tablet (150 mg total) by mouth 2 (two) times daily. 09/25/15   Dennie Bible, NP  oxyCODONE-acetaminophen (PERCOCET) 10-325 MG tablet Take 1 tablet by mouth every 6 (six) hours as needed for pain. Patient  taking differently: Take 1 tablet by mouth 3 (three) times daily.  05/03/15   Ripudeep K Rai, MD  OXYCONTIN 20 MG T12A 12 hr tablet TAKE 1 TABLET BY ORAL ROUTE EVERY 12 HOURS (DNF 09/24/14) 09/27/14   Historical Provider, MD  pantoprazole (PROTONIX) 40 MG tablet Take 40 mg by mouth daily.      Historical Provider, MD  polyethylene glycol (MIRALAX / GLYCOLAX) packet Take 17 g by mouth daily as  needed for mild constipation. 05/03/15   Ripudeep Krystal Eaton, MD  QUEtiapine (SEROQUEL) 300 MG tablet Take 600 mg by mouth at bedtime. Reported on 05/10/2015    Historical Provider, MD  temazepam (RESTORIL) 15 MG capsule Take 15 mg by mouth at bedtime.    Historical Provider, MD  Vilazodone HCl (VIIBRYD) 40 MG TABS Take 40 mg by mouth daily.    Historical Provider, MD  vitamin B-12 (CYANOCOBALAMIN) 1000 MCG tablet Take 1,000 mcg by mouth daily.    Historical Provider, MD    Family History No family history on file.  Social History Social History  Substance Use Topics  . Smoking status: Former Smoker    Packs/day: 1.50    Quit date: 01/27/1990  . Smokeless tobacco: Never Used  . Alcohol use No     Allergies   Patient has no known allergies.   Review of Systems Review of Systems  Constitutional: Negative for chills and fever.  HENT: Negative for ear pain and sore throat.   Eyes: Negative for pain and visual disturbance.  Respiratory: Negative for cough and shortness of breath.   Cardiovascular: Negative for chest pain and palpitations.  Gastrointestinal: Negative for abdominal pain and vomiting.  Genitourinary: Negative for dysuria and hematuria.  Musculoskeletal: Positive for gait problem. Negative for arthralgias and back pain.  Skin: Negative for color change and rash.  Neurological: Positive for dizziness, tremors and speech difficulty. Negative for seizures and syncope.  All other systems reviewed and are negative.    Physical Exam Updated Vital Signs BP 139/75 (BP Location: Right Arm)   Pulse 84   Temp 97.8 F (36.6 C) (Oral)   Resp 13   Ht 6\' 2"  (1.88 m)   Wt 90.3 kg   SpO2 100%   BMI 25.55 kg/m   Physical Exam  Constitutional: He is oriented to person, place, and time. He appears well-developed and well-nourished.  HENT:  Head: Normocephalic and atraumatic.  Eyes: Conjunctivae are normal.  Neck: Neck supple.  Cardiovascular: Normal rate and regular rhythm.     No murmur heard. Pulmonary/Chest: Effort normal and breath sounds normal. No respiratory distress.  Abdominal: Soft. There is no tenderness.  Musculoskeletal: He exhibits no edema.  Neurological: He is alert and oriented to person, place, and time. He has normal strength. He displays normal reflexes. No cranial nerve deficit or sensory deficit. He exhibits normal muscle tone. Coordination normal.  Speech is clear and organized. Patient has tremor of both legs with mild tremor of right upper extremity at rest. Tremor disappears with concentration and effort. Gait is normal.  Skin: Skin is warm and dry.  Psychiatric: He has a normal mood and affect.  Nursing note and vitals reviewed.    ED Treatments / Results  Labs (all labs ordered are listed, but only abnormal results are displayed) Labs Reviewed  APTT - Abnormal; Notable for the following:       Result Value   aPTT 37 (*)    All other components within normal limits  CBC -  Abnormal; Notable for the following:    RBC 3.03 (*)    Hemoglobin 9.6 (*)    HCT 27.5 (*)    Platelets 124 (*)    All other components within normal limits  COMPREHENSIVE METABOLIC PANEL - Abnormal; Notable for the following:    Sodium 131 (*)    Chloride 98 (*)    Glucose, Bld 112 (*)    Total Protein 6.0 (*)    ALT 15 (*)    All other components within normal limits  CBG MONITORING, ED - Abnormal; Notable for the following:    Glucose-Capillary 103 (*)    All other components within normal limits  I-STAT CHEM 8, ED - Abnormal; Notable for the following:    Sodium 132 (*)    Chloride 92 (*)    Glucose, Bld 108 (*)    Calcium, Ion 1.11 (*)    Hemoglobin 8.8 (*)    HCT 26.0 (*)    All other components within normal limits  PROTIME-INR  DIFFERENTIAL  I-STAT TROPOININ, ED    EKG  EKG Interpretation None       Radiology Ct Head Code Stroke W/o Cm  Addendum Date: 01/30/2016   ADDENDUM REPORT: 01/30/2016 19:55 ADDENDUM: Critical  Value/emergent results were called by telephone at the time of interpretation on 01/30/2016 at 7:51 pm to Dr. Cheral Marker, who verbally acknowledged these results. Electronically Signed   By: Jeannine Boga M.D.   On: 01/30/2016 19:55   Result Date: 01/30/2016 CLINICAL DATA:  Code stroke. Initial evaluation for acute right-sided weakness with slurred speech. EXAM: CT HEAD WITHOUT CONTRAST TECHNIQUE: Contiguous axial images were obtained from the base of the skull through the vertex without intravenous contrast. COMPARISON:  Prior study from 01/22/2016. FINDINGS: Brain: Mild no acute intracranial hemorrhage. No evidence for acute large vessel territory infarct. No mass lesion, midline shift, or mass effect. No hydrocephalus. No extra-axial fluid collection. Vascular: No hyperdense vessel identified. Skull: Scalp soft tissues within normal limits.  Calvarium intact. Sinuses/Orbits: Globes and orbital soft tissues within normal limits. Patient is status post lens extraction on the right. Paranasal sinuses and mastoid air cells are clear. ASPECTS [facility] Stroke Program Early CT Score) - Ganglionic level infarction (caudate, lentiform nuclei, internal capsule, insula, M1-M3 cortex): 7 - Supraganglionic infarction (M4-M6 cortex): 3 Total score (0-10 with 10 being normal): 10 IMPRESSION: 1. No acute intracranial infarct or other process identified. 2. ASPECTS is 10 3. 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 The stroke neurologist has been paged at the time of this dictation. Results will be communicated as soon as possible. An addendum will be made at that time. Electronically Signed: By: Jeannine Boga M.D. On: 01/30/2016 19:46    Procedures Procedures (including critical care time)  Medications Ordered in ED Medications  rOPINIRole (REQUIP) tablet 1 mg (1 mg Oral Given 01/31/16 0038)  diazepam (VALIUM) tablet 5 mg (5 mg Oral Given 01/31/16 0038)     Initial  Impression / Assessment and Plan / ED Course  I have reviewed the triage vital signs and the nursing notes.  Pertinent labs & imaging results that were available during my care of the patient were reviewed by me and considered in my medical decision making (see chart for details).  Clinical Course   Patient is a 75 year old male with past medical history as above who presents with complaints of speech difficulty, tremors, dizziness. Patient reported slurred speech began at 5 PM. Code stroke was initiated on  arrival for speech changes. CT head obtained and is negative for acute stroke. Patient was evaluated by neurology who do not believe he is having a stroke at this time. Code stroke was canceled. Laboratory workup unremarkable compared with prior. Patient is not hypoglycemic. He is hyponatremic at baseline but this appears mildly improved from baseline at 132. Troponin unremarkable. Patient was started on ropinirole per neurology recommendations for tremors and symptoms of less restless leg syndrome. He is also treated with a one time dose of by mouth Valium after discussion of the risks and benefits for patients with dementia. On reevaluation after these medicines patient reports his symptoms have significantly improved. He has been ambulatory to the bathroom several times without difficulty. Given patient's improvement and recent extensive and negative workup no indication for admission at this time. Patient was discharged in stable condition. He is flagged for follow-up with neurology. He is provided with a prescription for ropinirole and medication precautions and strict return precautions were discussed. Patient and family in agreement with plan.  Patient seen and discussed with Dr. Tyrone Nine, ED attending   Final Clinical Impressions(s) / ED Diagnoses   Final diagnoses:  Slurred speech  Tremor    New Prescriptions Discharge Medication List as of 01/31/2016  1:22 AM    START taking these  medications   Details  rOPINIRole (REQUIP) 1 MG tablet Take 1 tablet (1 mg total) by mouth 3 (three) times daily., Starting Wed 01/31/2016, Until Fri 03/01/2016, Print         Gibson Ramp, MD 01/31/16 [place], DO 01/31/16 1710

## 2016-01-30 NOTE — Discharge Planning (Signed)
Pt currently active with [facility] services.  Resumption of care requested. Danny Lawless of [facility] notified.  No DME needs identified at this time.

## 2016-01-30 NOTE — Consult Note (Signed)
NEURO HOSPITALIST CONSULT NOTE   Requestig physician: Dr. Tyrone Nine  Reason for Consult: Recurrent dizziness with leg tremors and paresthesias.  History obtained from:  Patient and Chart     HPI:                                                                                                                                          Dustin [name] is an 75 y.o. male who presents again for dizziness and gait unsteadiness after recent discharge following work up for such on 12/7. Prior complaints also included slurred speech, which he denies today. The current presentation includes complaint of RUE weakness. He states that he feels dizzy again, but that this is neither a presyncopal sensation nor vertigo. Onset of symptoms that brought him in previously followed and incident where a large heavy gun safe fell on top of him. Results of prior work up, including MRI brain, MRI C spine, MRI T-spine, vitamin B12, TSH and HIV testing were negative.   Of note, he has polypharmacy on his list of discharge diagnoses, with Abilfy, Restoril and Trileptal having been discontinued. Several of his other medications could not be stopped without a taper, due to potential for withdrawal. 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 Has been mildly hyponatremic, due to SIADH.   He also has a PMHx of insomnia, chronic neck pain and BPH.     Past Medical History:  Diagnosis Date  . BPH (benign prostatic hyperplasia)   . GERD (gastroesophageal reflux disease)    protonix for control  . Insomnia   . Neck pain   . Neuropathy [facility])     Past Surgical History:  Procedure Laterality Date  . ANTERIOR CERVICAL DECOMPRESSION/DISCECTOMY FUSION 4 LEVELS N/A 10/27/2012   Procedure: Cervical Three-Four Cervical Four-Five Cervical Five-Six Cervical Six-Seven  Anterior cervical decompression/diskectomy/fusion;  Surgeon: Erline Levine, MD;  Location: [place]  NEURO ORS;  Service: Neurosurgery;  Laterality: N/A;  Cervical Three-Four Cervical Four-Five Cervical Five-Six Cervical Six-Seven  Anterior cervical decompression/diskectomy/fusion  . SHOULDER ARTHROSCOPY  2010   rt shoulder  . SHOULDER ARTHROSCOPY  02/15/2011   Procedure: ARTHROSCOPY SHOULDER;  Surgeon: Cynda Familia;  Location: [place];  Service: Orthopedics;  Laterality: Right;  Debridement  . TONSILLECTOMY      No family history on file.  Social History:  reports that he quit smoking about 26 years ago. He smoked 1.50 packs per day. He has never used smokeless tobacco. He reports that he does not drink alcohol or use drugs.  No Known Allergies  OUTPATIENT MEDICATIONS:  ARIPiprazole (ABILIFY) 2 MG tablet Take 2 mg by mouth daily. Historical Provider, MD Needs Review  buPROPion (WELLBUTRIN XL) 300 MG 24 hr tablet Take 300 mg by mouth daily. Historical Provider, MD Needs Review  cholecalciferol (VITAMIN D) 1000 UNITS tablet Take 1,000 Units by mouth daily.  Historical Provider, MD Needs Review  finasteride (PROSCAR) 5 MG tablet Take 5 mg by mouth daily.  Historical Provider, MD Needs Review  L-Methylfolate-Algae-B12-B6 Glade Stanford) 3-90.314-2-35 MG CAPS Take one capsule daily. Marcial Pacas, MD Needs Review  lamoTRIgine (LAMICTAL) 100 MG tablet Take 100 mg by mouth daily. Historical Provider, MD Needs Review  nortriptyline (PAMELOR) 25 MG capsule Take 2 capsules (50 mg total) by mouth at bedtime. Dennie Bible, NP Needs Review  OXcarbazepine (TRILEPTAL) 150 MG tablet Take 1 tablet (150 mg total) by mouth 2 (two) times daily. Dennie Bible, NP Needs Review  oxyCODONE-acetaminophen (PERCOCET) 10-325 MG tablet Take 1 tablet by mouth every 6 (six) hours as needed for pain. Ripudeep Krystal Eaton, MD Needs Review   Patient taking differently: Take 1 tablet  by mouth 3 (three) times daily.     OXYCONTIN 20 MG T12A 12 hr tablet TAKE 1 TABLET BY ORAL ROUTE EVERY 12 HOURS (DNF 09/24/14) Historical Provider, MD Needs Review  pantoprazole (PROTONIX) 40 MG tablet Take 40 mg by mouth daily.  Historical Provider, MD Needs Review  polyethylene glycol (MIRALAX / GLYCOLAX) packet Take 17 g by mouth daily as needed for mild constipation. Ripudeep Krystal Eaton, MD Needs Review  QUEtiapine (SEROQUEL) 300 MG tablet Take 600 mg by mouth at bedtime. Reported on 05/10/2015 Historical Provider, MD Needs Review  temazepam (RESTORIL) 15 MG capsule Take 15 mg by mouth at bedtime. Historical Provider, MD Needs Review  Vilazodone HCl (VIIBRYD) 40 MG TABS Take 40 mg by mouth daily. Historical Provider, MD Needs Review  vitamin B-12 (CYANOCOBALAMIN) 1000 MCG tablet Take 1,000 mcg by mouth daily. Historical Provider, MD Needs Review     No current facility-administered medications for this encounter.   Current Outpatient Prescriptions:  .  ARIPiprazole (ABILIFY) 2 MG tablet, Take 2 mg by mouth daily., Disp: , Rfl:  .  buPROPion (WELLBUTRIN XL) 300 MG 24 hr tablet, Take 300 mg by mouth daily., Disp: , Rfl:  .  cholecalciferol (VITAMIN D) 1000 UNITS tablet, Take 1,000 Units by mouth daily. , Disp: , Rfl:  .  finasteride (PROSCAR) 5 MG tablet, Take 5 mg by mouth daily.  , Disp: , Rfl:  .  L-Methylfolate-Algae-B12-B6 (METANX) 3-90.314-2-35 MG CAPS, Take one capsule daily., Disp: 90 capsule, Rfl: 3 .  lamoTRIgine (LAMICTAL) 100 MG tablet, Take 100 mg by mouth daily., Disp: , Rfl:  .  nortriptyline (PAMELOR) 25 MG capsule, Take 2 capsules (50 mg total) by mouth at bedtime., Disp: 60 capsule, Rfl: 6 .  OXcarbazepine (TRILEPTAL) 150 MG tablet, Take 1 tablet (150 mg total) by mouth 2 (two) times daily., Disp: 60 tablet, Rfl: 6 .  oxyCODONE-acetaminophen (PERCOCET) 10-325 MG tablet, Take 1 tablet by mouth every 6 (six) hours as needed for pain. (Patient taking differently: Take 1 tablet by  mouth 3 (three) times daily. ), Disp: 30 tablet, Rfl: 0 .  OXYCONTIN 20 MG T12A 12 hr tablet, TAKE 1 TABLET BY ORAL ROUTE EVERY 12 HOURS (DNF 09/24/14), Disp: , Rfl: 0 .  pantoprazole (PROTONIX) 40 MG tablet, Take 40 mg by mouth daily.  , Disp: , Rfl:  .  polyethylene glycol (MIRALAX / GLYCOLAX) packet, Take 17 g by  mouth daily as needed for mild constipation., Disp: 30 each, Rfl: 0 .  QUEtiapine (SEROQUEL) 300 MG tablet, Take 600 mg by mouth at bedtime. Reported on 05/10/2015, Disp: , Rfl:  .  temazepam (RESTORIL) 15 MG capsule, Take 15 mg by mouth at bedtime., Disp: , Rfl:  .  Vilazodone HCl (VIIBRYD) 40 MG TABS, Take 40 mg by mouth daily., Disp: , Rfl:  .  vitamin B-12 (CYANOCOBALAMIN) 1000 MCG tablet, Take 1,000 mcg by mouth daily., Disp: , Rfl:     ROS:                                                                                                                                       History obtained from patient. Endorses dizziness and gait unsteadiness, as well as restless-leg like movements that are worse than any he has previously experienced. No fever, chest pain or SOB.    Blood pressure 139/75, pulse 84, temperature 97.8 F (36.6 C), temperature source Oral, resp. rate 13, height 6\' 2"  (1.88 m), weight 90.3 kg (199 lb), SpO2 100 %.   General Examination:                                                                                                      HEENT-  Normocephalic/atraumatic.  Lungs- No gross wheezes. Respirations unlabored.  Extremities- Warm and well-perfused.   Neurological Examination Mental Status: Alert, fully oriented, thought content appropriate.  Speech fluent without evidence of aphasia.  Able to follow all commands without difficulty. Cranial Nerves: II: Visual fields intact to bedside confrontation OS with right inferior field defect on testing of right eye. PERRL. III,IV, VI: ptosis not present, EOMI V,VII: smile symmetric, facial light touch sensation  normal bilaterally VIII: hearing acuity decreased IX,X: no hypophonia noted XI: symmetric XII: midline tongue extension Motor: Right : Upper extremity   5/5    Left:     Upper extremity   5/5  Lower extremity   5/5     Lower extremity   5/5 Exhibits intermittent erratic movements of distal lower extremities that patient states is associated with an unpleasant sensation. He is able to suppress this for several seconds stating that it takes increasing effort to suppress.  Sensory: Temperature and light touch intact x 4 Deep Tendon Reflexes: Normoactive without asymmetry.  Cerebellar: Prominent action tremor noted with FNF bilaterally.  Gait: After initial code stroke protocol was completed, patient was able to ambulate to the toilet.    Lab  Results: Basic Metabolic Panel:  Recent Labs Lab 01/24/16 0334 01/25/16 0955 01/30/16 1048 01/30/16 1920 01/30/16 1926  NA 126* 132* 132* 131* 132*  K 3.7 3.9 3.9 3.8 3.7  CL 92* 93* 96* 98* 92*  CO2 24 30 27 26   --   GLUCOSE 101* 111* 113* 112* 108*  BUN 7 7 12 11 12   CREATININE 0.89 1.07 1.04 0.94 0.90  CALCIUM 8.9 9.6 9.1 9.1  --   MG 1.8  --   --   --   --     Liver Function Tests:  Recent Labs Lab 01/30/16 1048 01/30/16 1920  AST 24 22  ALT 18 15*  ALKPHOS 75 71  BILITOT 0.6 0.8  PROT 6.1* 6.0*  ALBUMIN 4.2 4.0   No results for input(s): LIPASE, AMYLASE in the last 168 hours. No results for input(s): AMMONIA in the last 168 hours.  CBC:  Recent Labs Lab 01/30/16 1048 01/30/16 1920 01/30/16 1926  WBC 9.6 5.6  --   NEUTROABS 8.1* 4.2  --   HGB 10.8* 9.6* 8.8*  HCT 31.2* 27.5* 26.0*  MCV 91.2 90.8  --   PLT 138* 124*  --     Cardiac Enzymes: No results for input(s): CKTOTAL, CKMB, CKMBINDEX, TROPONINI in the last 168 hours.  Lipid Panel: No results for input(s): CHOL, TRIG, HDL, CHOLHDL, VLDL, LDLCALC in the last 168 hours.  CBG:  Recent Labs Lab 01/30/16 1957  GLUCAP 103*    Microbiology: Results  for orders placed or performed during the hospital encounter of 01/22/16  Culture, blood (Routine X 2) w Reflex to ID Panel     Status: None   Collection Time: 01/22/16  5:54 PM  Result Value Ref Range Status   Specimen Description BLOOD RIGHT FOREARM  Final   Special Requests BOTTLES DRAWN AEROBIC ONLY 5CC  Final   Culture NO GROWTH 5 DAYS  Final   Report Status 01/27/2016 FINAL  Final  Culture, blood (Routine X 2) w Reflex to ID Panel     Status: None   Collection Time: 01/22/16  6:02 PM  Result Value Ref Range Status   Specimen Description BLOOD RIGHT ANTECUBITAL  Final   Special Requests BOTTLES DRAWN AEROBIC ONLY 5CC  Final   Culture NO GROWTH 5 DAYS  Final   Report Status 01/27/2016 FINAL  Final  Urine culture     Status: Abnormal   Collection Time: 01/22/16  7:03 PM  Result Value Ref Range Status   Specimen Description URINE, RANDOM  Final   Special Requests NONE  Final   Culture MULTIPLE SPECIES PRESENT, SUGGEST RECOLLECTION (A)  Final   Report Status 01/24/2016 FINAL  Final    Coagulation Studies:  Recent Labs  01/30/16 1920  LABPROT 14.6  INR 1.13    Imaging: Ct Head Code Stroke W/o Cm  Addendum Date: 01/30/2016   ADDENDUM REPORT: 01/30/2016 19:55 ADDENDUM: Critical Value/emergent results were called by telephone at the time of interpretation on 01/30/2016 at 7:51 pm to Dr. Cheral Marker, who verbally acknowledged these results. Electronically Signed   By: Jeannine Boga M.D.   On: 01/30/2016 19:55   Result Date: 01/30/2016 CLINICAL DATA:  Code stroke. Initial evaluation for acute right-sided weakness with slurred speech. EXAM: CT HEAD WITHOUT CONTRAST TECHNIQUE: Contiguous axial images were obtained from the base of the skull through the vertex without intravenous contrast. COMPARISON:  Prior study from 01/22/2016. FINDINGS: Brain: Mild no acute intracranial hemorrhage. No evidence for acute large vessel territory  infarct. No mass lesion, midline shift, or mass  effect. No hydrocephalus. No extra-axial fluid collection. Vascular: No hyperdense vessel identified. Skull: Scalp soft tissues within normal limits.  Calvarium intact. Sinuses/Orbits: Globes and orbital soft tissues within normal limits. Patient is status post lens extraction on the right. Paranasal sinuses and mastoid air cells are clear. ASPECTS [facility] Stroke Program Early CT Score) - Ganglionic level infarction (caudate, lentiform nuclei, internal capsule, insula, M1-M3 cortex): 7 - Supraganglionic infarction (M4-M6 cortex): 3 Total score (0-10 with 10 being normal): 10 IMPRESSION: 1. No acute intracranial infarct or other process identified. 2. ASPECTS is 10 3. 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 The stroke neurologist has been paged at the time of this dictation. Results will be communicated as soon as possible. An addendum will be made at that time. Electronically Signed: By: Jeannine Boga M.D. On: 01/30/2016 19:46   Assessment: 1. Acute onset of dizziness and gait unsteadiness. Similar to presentation of his recent admission. CT head revealed no hemorrhage or new changes. No lateralized findings on exam. NIHSS scored components are attributable to old deficit seen on prior examinations. IV tPA not indicated. No localizable acute lesion to account for his presentation. He has an anxious affect and symptoms may be related to anxiety attack on a baseline of BPPV or multifactorial gait instability. Recent extensive work up including MRI brain was unrevealing. 2. Claudie Fisherman recently discontinued. Possible withdrawal symptoms include nausea, lightheadedness and anxiety.  3. Restoril recently discontinued. Possible withdrawal symptoms include tremor and anxiety.   4. Peripheral axonal neuropathy, symptomatically treated with Neurontin.  5. Possible mild dementia.  6. Possible exacerbation of restless leg syndrome. 7. 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  8. Hyponatremia secondary to SIADH.   Recommendations: 1. Possible exacerbation of restless leg syndrome. Valium 5 mg for acute symptoms. Start ropinirole 1 mg po TID with expected improvement to occur within one week of initiation.  2. Outpatient ophthalmology evaluation for dilated retinal exam. Has monocular inferior temporal field defect OD.  3. Outpatient ENT evaluation for possible BPPV.  4. Outpatient Neurology follow up.  5. Outpatient PT evaluation and treatment for multifactorial gait unsteadiness. 6. Outpatient primary care follow up regarding hyponatremia and possible incipient/mild dementia.   Electronically signed: Dr. Kerney Elbe 01/30/2016, 8:09 PM

## 2016-01-30 NOTE — ED Triage Notes (Signed)
Per [facility]: Was seen earlier today. Tonight started having generalized weakness, feet felt heavy and some dizziness. Stroke screen negative. No facial droop or arm drift. When he was talking the pt stated that he felt like his speech was "sloshed" he says that this has been going on since 6, then he said 5, then he said off for about a week and a half. The pt states that this is not normal for him. Pt states that he feels like his legs are restless and can't keep them still.

## 2016-01-30 NOTE — ED Notes (Signed)
Called [facility] for code stroke, instructed to by the nurse Clydene Fake

## 2016-01-30 NOTE — Code Documentation (Signed)
Code stroke called at 68 for this 75 y/o white male who was LSW at 1545 hrs.  Pt had been seen earlier today at [facility]  for generalized weakness.  Pt was discharged to home at 1545 hrs able to ambulate at noted to be at baseline.  At 1630  hrs pt  was again noted by family to be unable to stand, exhibiting restless leg, c/o numbness in bilateral legs and feet, was confused and had slurred speech.  EMS was summoned once again with pt arriving at [place]  hrs .  He was cleared for CT at [place] arriving in [place] at 1921.  Dr Cheral Marker arrived at 1924 with [facility] arrival at 1926.  NIHSS was performed in radiology.  Pt scored a 4 with points given for  Partial hemianopia,  left arm drift, and  Ataxia in two limbs.Pt was then returned to [facility].  CBG 103. Results of no acute findings on  head CT read  were called to Dr Cheral Marker at [phone].  Code stroke was cancelled by Dr Cheral Marker at [phone].  Pt will be admitted to medicine service.

## 2016-01-30 NOTE — ED Provider Notes (Signed)
[place] DEPT Provider Note   CSN: [id number] Arrival date & time: 01/30/16  0900   History   Chief Complaint Chief Complaint  Patient presents with  . Dizziness    HPI Dustin [name] is a 75 y.o. male.  HPI   75 year old male presents today with complaints of dizziness. Patient was recently hospitalized and discharged with similar complaints. He notes he had MRI of his head and neck, echo of his heart. He reports they found no significant abnormalities, noted that he was slightly hyponatremic. He was discharged home with in-home health care, a walker, primary care follow-up. Patient notes that since hospital discharge on 01/25/2016 he has waxing and waning of symptoms. Patient reports yesterday he was feeling great was able to walk without the walker and had no dizziness or unsteady gait. Patient reports he got up this morning felt slightly dizzy and fell against the wall. Patient has a difficult time describing his dizziness, he denies any associated neurological deficits with these episodes. He reports that head movements do not make symptoms worse, worse with getting up to stand. Patient denies any infectious etiology, any associated chest pain or shortness of breath.   Past Medical History:  Diagnosis Date  . BPH (benign prostatic hyperplasia)   . GERD (gastroesophageal reflux disease)    protonix for control  . Insomnia   . Neck pain   . Neuropathy [facility])     Patient Active Problem List   Diagnosis Date Noted  . Ataxia   . Hyponatremia 01/22/2016  . Gait instability 01/22/2016  . Urinary retention   . Stroke-like symptom   . Syncope 05/02/2015  . Closed left fibular fracture 05/02/2015  . GERD (gastroesophageal reflux disease) 05/02/2015  . BPH (benign prostatic hyperplasia) 05/02/2015  . Fracture of distal end of left fibula   . 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 Depression 01/30/2015  . 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 ANTERIOR CERVICAL DECOMPRESSION/DISCECTOMY FUSION 4 LEVELS N/A 10/27/2012   Procedure: Cervical Three-Four Cervical Four-Five Cervical Five-Six Cervical Six-Seven  Anterior cervical decompression/diskectomy/fusion;  Surgeon: Erline Levine, MD;  Location: [place] [facility];  Service: Neurosurgery;  Laterality: N/A;  Cervical Three-Four Cervical Four-Five Cervical Five-Six Cervical Six-Seven  Anterior cervical decompression/diskectomy/fusion  . SHOULDER ARTHROSCOPY  2010   rt shoulder  . SHOULDER ARTHROSCOPY  02/15/2011   Procedure: ARTHROSCOPY SHOULDER;  Surgeon: Cynda Familia;  Location: [facility];  Service: Orthopedics;  Laterality: Right;  Debridement  . TONSILLECTOMY         Home Medications    Prior to Admission medications   Medication Sig Start Date End Date Taking? Authorizing Provider  L-Methylfolate-Algae-B12-B6 Glade Stanford) [phone] MG CAPS Take one capsule daily. 11/02/15  Yes Marcial Pacas, MD  nortriptyline (PAMELOR) 25 MG capsule Take 2 capsules (50 mg total) by mouth at bedtime. 09/25/15  Yes Dennie Bible, NP  OXcarbazepine (TRILEPTAL) 150 MG tablet Take 1 tablet (150 mg total) by mouth 2 (two) times daily. 09/25/15  Yes Dennie Bible, NP  ARIPiprazole (ABILIFY) 2 MG tablet Take 2 mg by mouth daily.    Historical Provider, MD  buPROPion (WELLBUTRIN XL) 300 MG 24 hr tablet Take 300 mg by mouth daily.    Historical Provider, MD  cholecalciferol (VITAMIN D) 1000 UNITS tablet Take 1,000 Units by mouth daily.     Historical Provider, MD  finasteride (PROSCAR) 5 MG tablet Take 5 mg by mouth daily.  Historical Provider, MD  lamoTRIgine (LAMICTAL) 100 MG tablet Take 100 mg by mouth daily.    Historical Provider, MD  oxyCODONE-acetaminophen (PERCOCET) 10-325 MG tablet Take 1 tablet by mouth every 6 (six) hours as needed for pain. Patient taking differently: Take 1 tablet by mouth 3 (three) times daily.  05/03/15   Ripudeep K  Rai, MD  OXYCONTIN 20 MG T12A 12 hr tablet TAKE 1 TABLET BY ORAL ROUTE EVERY 12 HOURS (DNF 09/24/14) 09/27/14   Historical Provider, MD  pantoprazole (PROTONIX) 40 MG tablet Take 40 mg by mouth daily.      Historical Provider, MD  polyethylene glycol (MIRALAX / GLYCOLAX) packet Take 17 g by mouth daily as needed for mild constipation. 05/03/15   Ripudeep Krystal Eaton, MD  QUEtiapine (SEROQUEL) 300 MG tablet Take 600 mg by mouth at bedtime. Reported on 05/10/2015    Historical Provider, MD  temazepam (RESTORIL) 15 MG capsule Take 15 mg by mouth at bedtime.    Historical Provider, MD  Vilazodone HCl (VIIBRYD) 40 MG TABS Take 40 mg by mouth daily.    Historical Provider, MD  vitamin B-12 (CYANOCOBALAMIN) 1000 MCG tablet Take 1,000 mcg by mouth daily.    Historical Provider, MD    Family History No family history on file.  Social History Social History  Substance Use Topics  . Smoking status: Former Smoker    Packs/day: 1.50    Quit date: 01/27/1990  . Smokeless tobacco: Never Used  . Alcohol use No     Allergies   Patient has no known allergies.   Review of Systems Review of Systems  All other systems reviewed and are negative.    Physical Exam Updated Vital Signs BP 137/83   Pulse 88   Temp 97.8 F (36.6 C) (Oral)   Resp 15   Ht 6\' 2"  (1.88 m)   Wt 90.3 kg   SpO2 100%   BMI 25.55 kg/m   Physical Exam  Constitutional: He is oriented to person, place, and time. He appears well-developed and well-nourished.  HENT:  Head: Normocephalic and atraumatic.  Eyes: Conjunctivae are normal. Pupils are equal, round, and reactive to light. Right eye exhibits no discharge. Left eye exhibits no discharge. No scleral icterus.  Neck: Normal range of motion. No JVD present. No tracheal deviation present.  Pulmonary/Chest: Effort normal. No stridor.  Musculoskeletal:  Abrasion noted to left elbow- tenderness or abnormality. Full active range of motion of the elbow  Neurological: He is alert and  oriented to person, place, and time. He has normal strength. He is not disoriented. No cranial nerve deficit or sensory deficit. He displays a negative Romberg sign. Coordination normal. GCS eye subscore is 4. GCS verbal subscore is 5. GCS motor subscore is 6.  Psychiatric: He has a normal mood and affect. His behavior is normal. Judgment and thought content normal.  Nursing note and vitals reviewed.    ED Treatments / Results  Labs (all labs ordered are listed, but only abnormal results are displayed) Labs Reviewed  CBC WITH DIFFERENTIAL/PLATELET - Abnormal; Notable for the following:       Result Value   RBC 3.42 (*)    Hemoglobin 10.8 (*)    HCT 31.2 (*)    Platelets 138 (*)    Neutro Abs 8.1 (*)    All other components within normal limits  COMPREHENSIVE METABOLIC PANEL - Abnormal; Notable for the following:    Sodium 132 (*)    Chloride 96 (*)  Glucose, Bld 113 (*)    Total Protein 6.1 (*)    All other components within normal limits    EKG  EKG Interpretation None       Radiology No results found.  Procedures Procedures (including critical care time)  Medications Ordered in ED Medications - No data to display   Initial Impression / Assessment and Plan / ED Course  I have reviewed the triage vital signs and the nursing notes.  Pertinent labs & imaging results that were available during my care of the patient were reviewed by me and considered in my medical decision making (see chart for details).  Clinical Course      Final Clinical Impressions(s) / ED Diagnoses   Final diagnoses:  Dizziness    Labs:  Imaging:  Consults:  Therapeutics:  Discharge Meds:   Assessment/Plan:  75 year old male presents today with complaints of dizziness. Patient has been evaluated with MR thoracic/cervical with without, MRA head without, MR brain, echocardiogram with no significant findings. Patient was noted be hyponatremic, no longer significantly  hyponatremic. Patient reports improvement in symptoms during his stay in the ED, he personally ambulated him in the hallway he had no dizziness or ataxia. Patient has a normal neuro exam presently, requesting discharge home. Face-to-face evaluation patient be placed, patient was discharged home with his son with strict return precautions. Encouraged follow-up with primary care as previously scheduled, return immediately if any new or worsening signs or symptoms present. They verbalized understanding and agreement today's plan had no further questions or concerns at time of discharge      New Prescriptions Discharge Medication List as of 01/30/2016  3:11 PM       Okey Regal, PA-C 01/30/16 [place], MD 02/03/16 [phone]

## 2016-01-30 NOTE — ED Triage Notes (Signed)
Pt arrives via [facility] from home for c/o dizziness with movement, pt was discharged Saturday for the same. Denies any syncopal episodes or pain, pt a/ox4, resp e/u, nad.

## 2016-01-31 DIAGNOSIS — R4781 Slurred speech: Secondary | ICD-10-CM | POA: Diagnosis not present

## 2016-01-31 MED ORDER — ROPINIROLE HCL 1 MG PO TABS
1.0000 mg | ORAL_TABLET | Freq: Three times a day (TID) | ORAL | Status: DC
  Administered 2016-01-31: 1 mg via ORAL
  Filled 2016-01-31: qty 1

## 2016-01-31 MED ORDER — DIAZEPAM 5 MG PO TABS
5.0000 mg | ORAL_TABLET | Freq: Once | ORAL | Status: AC
Start: 2016-01-31 — End: 2016-01-31
  Administered 2016-01-31: 5 mg via ORAL
  Filled 2016-01-31: qty 1

## 2016-01-31 MED ORDER — ROPINIROLE HCL 1 MG PO TABS: 1.0000 mg | ORAL_TABLET | Freq: Three times a day (TID) | ORAL | 0 refills | Status: DC

## 2016-01-31 NOTE — ED Notes (Signed)
Pt departed in NAD.  

## 2016-02-06 ENCOUNTER — Encounter (HOSPITAL_COMMUNITY): Payer: Self-pay | Admitting: Emergency Medicine

## 2016-02-06 ENCOUNTER — Emergency Department (HOSPITAL_COMMUNITY): Payer: Medicare Other

## 2016-02-06 ENCOUNTER — Emergency Department (HOSPITAL_COMMUNITY)
Admission: EM | Admit: 2016-02-06 | Discharge: 2016-02-06 | Disposition: A | Discharge status: Home / Self Care | Payer: Medicare Other | Attending: Emergency Medicine | Admitting: Emergency Medicine

## 2016-02-06 DIAGNOSIS — Y999 Unspecified external cause status: Secondary | ICD-10-CM | POA: Insufficient documentation

## 2016-02-06 DIAGNOSIS — S2242XA Multiple fractures of ribs, left side, initial encounter for closed fracture: Secondary | ICD-10-CM | POA: Diagnosis not present

## 2016-02-06 DIAGNOSIS — S51012A Laceration without foreign body of left elbow, initial encounter: Secondary | ICD-10-CM | POA: Insufficient documentation

## 2016-02-06 DIAGNOSIS — W2203XA Walked into furniture, initial encounter: Secondary | ICD-10-CM | POA: Diagnosis not present

## 2016-02-06 DIAGNOSIS — S069X9A Unspecified intracranial injury with loss of consciousness of unspecified duration, initial encounter: Secondary | ICD-10-CM | POA: Insufficient documentation

## 2016-02-06 DIAGNOSIS — T148XXA Other injury of unspecified body region, initial encounter: Secondary | ICD-10-CM | POA: Diagnosis not present

## 2016-02-06 DIAGNOSIS — Y9389 Activity, other specified: Secondary | ICD-10-CM | POA: Diagnosis not present

## 2016-02-06 DIAGNOSIS — S299XXA Unspecified injury of thorax, initial encounter: Secondary | ICD-10-CM | POA: Diagnosis present

## 2016-02-06 DIAGNOSIS — S0990XA Unspecified injury of head, initial encounter: Secondary | ICD-10-CM | POA: Diagnosis not present

## 2016-02-06 DIAGNOSIS — M542 Cervicalgia: Secondary | ICD-10-CM | POA: Diagnosis not present

## 2016-02-06 DIAGNOSIS — S0083XA Contusion of other part of head, initial encounter: Secondary | ICD-10-CM | POA: Insufficient documentation

## 2016-02-06 DIAGNOSIS — Y92009 Unspecified place in unspecified non-institutional (private) residence as the place of occurrence of the external cause: Secondary | ICD-10-CM | POA: Insufficient documentation

## 2016-02-06 LAB — CBC WITH DIFFERENTIAL/PLATELET
Basophils Absolute: 0 10*3/uL (ref 0.0–0.1)
Basophils Relative: 0 %
Eosinophils Absolute: 0.2 10*3/uL (ref 0.0–0.7)
Eosinophils Relative: 3 %
HCT: 30.8 % — ABNORMAL LOW (ref 39.0–52.0)
Hemoglobin: 10.6 g/dL — ABNORMAL LOW (ref 13.0–17.0)
Lymphocytes Relative: 13 %
Lymphs Abs: 0.9 10*3/uL (ref 0.7–4.0)
MCH: 31.9 pg (ref 26.0–34.0)
MCHC: 34.4 g/dL (ref 30.0–36.0)
MCV: 92.8 fL (ref 78.0–100.0)
Monocytes Absolute: 0.5 10*3/uL (ref 0.1–1.0)
Monocytes Relative: 6 %
Neutro Abs: 5.6 10*3/uL (ref 1.7–7.7)
Neutrophils Relative %: 78 %
Platelets: UNDETERMINED 10*3/uL (ref 150–400)
RBC: 3.32 MIL/uL — ABNORMAL LOW (ref 4.22–5.81)
RDW: 13.5 % (ref 11.5–15.5)
WBC: 7.2 10*3/uL (ref 4.0–10.5)

## 2016-02-06 LAB — BASIC METABOLIC PANEL
Anion gap: 10 (ref 5–15)
BUN: 12 mg/dL (ref 6–20)
CO2: 25 mmol/L (ref 22–32)
Calcium: 9.5 mg/dL (ref 8.9–10.3)
Chloride: 94 mmol/L — ABNORMAL LOW (ref 101–111)
Creatinine, Ser: 1.06 mg/dL (ref 0.61–1.24)
GFR calc Af Amer: >60 mL/min (ref >60)
GFR calc non Af Amer: >60 mL/min (ref >60)
Glucose, Bld: 95 mg/dL (ref 65–99)
Potassium: 3.8 mmol/L (ref 3.5–5.1)
Sodium: 129 mmol/L — ABNORMAL LOW (ref 135–145)

## 2016-02-06 LAB — I-STAT TROPONIN, ED: Troponin i, poc: 0 ng/mL (ref 0.00–0.08)

## 2016-02-06 MED ORDER — GI COCKTAIL ~~LOC~~
30.0000 mL | Freq: Once | ORAL | Status: AC
  Administered 2016-02-06: 30 mL via ORAL
  Filled 2016-02-06: qty 30

## 2016-02-06 MED ORDER — OXYCODONE HCL 5 MG PO TABS
10.0000 mg | ORAL_TABLET | Freq: Once | ORAL | Status: AC
  Administered 2016-02-06: 10 mg via ORAL
  Filled 2016-02-06: qty 2

## 2016-02-06 MED ORDER — BACITRACIN ZINC 500 UNIT/GM EX OINT
TOPICAL_OINTMENT | Freq: Once | CUTANEOUS | Status: AC
  Administered 2016-02-06: 1 via TOPICAL
  Filled 2016-02-06: qty 0.9

## 2016-02-06 MED ORDER — ACETAMINOPHEN 500 MG PO TABS
1000.0000 mg | ORAL_TABLET | Freq: Once | ORAL | Status: AC
  Administered 2016-02-06: 1000 mg via ORAL

## 2016-02-06 MED ORDER — ACETAMINOPHEN 500 MG PO TABS
1000.0000 mg | ORAL_TABLET | Freq: Once | ORAL | Status: DC
  Filled 2016-02-06: qty 2

## 2016-02-06 NOTE — ED Triage Notes (Signed)
Patient presents today from Home with Fall. States all her remembers was getting up and walking and we woke up with EMS standing over him. Patient states he may have hit his head on dinning room table. Patient has Lac to right eyebrow brusing below the eye. Skin tear to left elbow. Patient Alert and oriented x4. Patient complains of left rib pain. Patient denies any SOB CP. C-coller in place.

## 2016-02-06 NOTE — ED Notes (Signed)
Patient still in scan.

## 2016-02-06 NOTE — ED Notes (Signed)
Patient still off the floor for scan. 

## 2016-02-06 NOTE — ED Notes (Signed)
RN attempted to obtain blood.

## 2016-02-06 NOTE — ED Provider Notes (Signed)
[place] DEPT Provider Note   CSN: [id number] Arrival date & time: 02/06/16  1749     History   Chief Complaint Chief Complaint  Patient presents with  . Fall    HPI Dustin [name] is a 75 y.o. male.  The history is provided by the patient and a relative.  Fall  This is a new problem. The current episode started 1 to 2 hours ago. The problem occurs constantly. The problem has not changed since onset.Associated symptoms include chest pain (on left from fall yesterday). Associated symptoms comments: Loss of consciousness during event. Nothing aggravates the symptoms. Nothing relieves the symptoms. He has tried nothing for the symptoms.    Past Medical History:  Diagnosis Date  . BPH (benign prostatic hyperplasia)   . GERD (gastroesophageal reflux disease)    protonix for control  . Insomnia   . Neck pain   . Neuropathy [facility])     Patient Active Problem List   Diagnosis Date Noted  . Ataxia   . Hyponatremia 01/22/2016  . Gait instability 01/22/2016  . Urinary retention   . Stroke-like symptom   . Syncope 05/02/2015  . Closed left fibular fracture 05/02/2015  . GERD (gastroesophageal reflux disease) 05/02/2015  . BPH (benign prostatic hyperplasia) 05/02/2015  . Fracture of distal end of left fibula   . 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 Depression 01/30/2015  . 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 ANTERIOR CERVICAL DECOMPRESSION/DISCECTOMY FUSION 4 LEVELS N/A 10/27/2012   Procedure: Cervical Three-Four Cervical Four-Five Cervical Five-Six Cervical Six-Seven  Anterior cervical decompression/diskectomy/fusion;  Surgeon: Erline Levine, MD;  Location: [place] [facility];  Service: Neurosurgery;  Laterality: N/A;  Cervical Three-Four Cervical Four-Five Cervical Five-Six Cervical Six-Seven  Anterior cervical decompression/diskectomy/fusion  . SHOULDER ARTHROSCOPY  2010   rt shoulder  . SHOULDER ARTHROSCOPY  02/15/2011   Procedure: ARTHROSCOPY SHOULDER;  Surgeon: Cynda Familia;  Location: [place];  Service: Orthopedics;  Laterality: Right;  Debridement  . TONSILLECTOMY         Home Medications    Prior to Admission medications   Medication Sig Start Date End Date Taking? Authorizing Provider  ARIPiprazole (ABILIFY) 2 MG tablet Take 2 mg by mouth daily.    Historical Provider, MD  buPROPion (WELLBUTRIN XL) 300 MG 24 hr tablet Take 300 mg by mouth daily.    Historical Provider, MD  cholecalciferol (VITAMIN D) 1000 UNITS tablet Take 1,000 Units by mouth daily.     Historical Provider, MD  finasteride (PROSCAR) 5 MG tablet Take 5 mg by mouth daily.      Historical Provider, MD  L-Methylfolate-Algae-B12-B6 Glade Stanford) 3-90.314-2-35 MG CAPS Take one capsule daily. 11/02/15   Marcial Pacas, MD  lamoTRIgine (LAMICTAL) 100 MG tablet Take 100 mg by mouth daily.    Historical Provider, MD  nortriptyline (PAMELOR) 25 MG capsule Take 2 capsules (50 mg total) by mouth at bedtime. 09/25/15   Dennie Bible, NP  OXcarbazepine (TRILEPTAL) 150 MG tablet Take 1 tablet (150 mg total) by mouth 2 (two) times daily. 09/25/15   Dennie Bible, NP  oxyCODONE-acetaminophen (PERCOCET) 10-325 MG tablet Take 1 tablet by mouth every 6 (six) hours as needed for pain. Patient taking differently: Take 1 tablet by mouth 3 (three) times daily.  05/03/15   Ripudeep K Rai, MD  OXYCONTIN 20 MG T12A 12 hr tablet TAKE 1 TABLET BY ORAL ROUTE EVERY 12 HOURS (DNF 09/24/14) 09/27/14  Historical Provider, MD  pantoprazole (PROTONIX) 40 MG tablet Take 40 mg by mouth daily.      Historical Provider, MD  polyethylene glycol (MIRALAX / GLYCOLAX) packet Take 17 g by mouth daily as needed for mild constipation. 05/03/15   Ripudeep Krystal Eaton, MD  QUEtiapine (SEROQUEL) 300 MG tablet Take 600 mg by mouth at bedtime. Reported on 05/10/2015    Historical Provider, MD  rOPINIRole (REQUIP) 1 MG tablet Take 1 tablet (1 mg total) by mouth 3 (three)  times daily. 01/31/16 03/01/16  Gibson Ramp, MD  temazepam (RESTORIL) 15 MG capsule Take 15 mg by mouth at bedtime.    Historical Provider, MD  Vilazodone HCl (VIIBRYD) 40 MG TABS Take 40 mg by mouth daily.    Historical Provider, MD  vitamin B-12 (CYANOCOBALAMIN) 1000 MCG tablet Take 1,000 mcg by mouth daily.    Historical Provider, MD    Family History No family history on file.  Social History Social History  Substance Use Topics  . Smoking status: Former Smoker    Packs/day: 1.50    Quit date: 01/27/1990  . Smokeless tobacco: Never Used  . Alcohol use No     Allergies   Patient has no known allergies.   Review of Systems Review of Systems  Cardiovascular: Positive for chest pain (on left from fall yesterday).  All other systems reviewed and are negative.    Physical Exam Updated Vital Signs BP 160/86   Pulse 88   Temp 97.9 F (36.6 C) (Oral)   Resp 21   SpO2 100%   Physical Exam  Constitutional: He is oriented to person, place, and time. He appears well-developed and well-nourished. No distress.  HENT:  Head: Normocephalic.  Nose: Nose normal.  Contusion above right eye with small superficial abrasion  Eyes: Conjunctivae are normal.  Neck: Neck supple. No tracheal deviation present.  Cardiovascular: Normal rate, regular rhythm and normal heart sounds.   Pulmonary/Chest: Effort normal and breath sounds normal. No respiratory distress. He exhibits tenderness. He exhibits no edema, no deformity and no retraction.    Abdominal: Soft. Normal appearance. He exhibits no distension. There is no tenderness.  Musculoskeletal:       Left forearm: He exhibits no tenderness.  3 cm superficial skin tear of left elbow  Neurological: He is alert and oriented to person, place, and time. He has normal strength. No cranial nerve deficit or sensory deficit. GCS eye subscore is 4. GCS verbal subscore is 5. GCS motor subscore is 6.  Normal finger to nose testing and rapid  alternating movement   Skin: Skin is warm and dry.  Psychiatric: He has a normal mood and affect.     ED Treatments / Results  Labs (all labs ordered are listed, but only abnormal results are displayed) Labs Reviewed  CBC WITH DIFFERENTIAL/PLATELET - Abnormal; Notable for the following:       Result Value   RBC 3.32 (*)    Hemoglobin 10.6 (*)    HCT 30.8 (*)    All other components within normal limits  BASIC METABOLIC PANEL - Abnormal; Notable for the following:    Sodium 129 (*)    Chloride 94 (*)    All other components within normal limits  I-STAT TROPOININ, ED    EKG  EKG Interpretation  Date/Time:  Tuesday February 06 2016 19:11:19 EST Ventricular Rate:  90 PR Interval:    QRS Duration: 94 QT Interval:  356 QTC Calculation: 436 R Axis:  30 Text Interpretation:  Sinus rhythm No significant change since last tracing Confirmed by Jiya Kissinger MD, Nicollette Wilhelmi [phone]) on 02/06/2016 7:18:44 PM       Radiology Dg Ribs Unilateral W/chest Left  Result Date: 02/06/2016 CLINICAL DATA:  76 year old male with a history of inferior left-sided rib pain EXAM: LEFT RIBS AND CHEST - 3+ VIEW COMPARISON:  01/22/2016 FINDINGS: Cardiomediastinal silhouette within normal limits. No confluent airspace disease.  No pleural effusion or pneumothorax. Surgical changes of the cervical region incompletely imaged. Nondisplaced rib fractures of the left ninth, tenth, eleventh ribs IMPRESSION: Nondisplaced fractures of the left ninth, tenth, eleventh ribs. No evidence of acute cardiopulmonary disease. Signed, Dulcy Fanny. Earleen Newport, DO [facility] Radiology Electronically Signed   By: Corrie Mckusick D.O.   On: 02/06/2016 20:19   Ct Head Wo Contrast  Result Date: 02/06/2016 CLINICAL DATA:  75 year old male with fall and trauma to the head. EXAM: CT HEAD WITHOUT CONTRAST TECHNIQUE: Contiguous axial images were obtained from the base of the skull through the vertex  without intravenous contrast. COMPARISON:  Head CT dated 01/30/2016 FINDINGS: Brain: No evidence of acute infarction, hemorrhage, hydrocephalus, extra-axial collection or mass lesion/mass effect. Vascular: No hyperdense vessel or unexpected calcification. Skull: Normal. Negative for fracture or focal lesion. Sinuses/Orbits: No acute finding.  Right cataract surgery. Other: Right forehead hematoma. IMPRESSION: No acute intracranial pathology. Electronically Signed   By: Anner Crete M.D.   On: 02/06/2016 21:19    Procedures Procedures (including critical care time)  Medications Ordered in ED Medications  oxyCODONE (Oxy IR/ROXICODONE) immediate release tablet 10 mg (10 mg Oral Given 02/06/16 2136)  acetaminophen (TYLENOL) tablet 1,000 mg (1,000 mg Oral Given 02/06/16 2133)  gi cocktail (Maalox,Lidocaine,Donnatal) (30 mLs Oral Given 02/06/16 2146)  bacitracin ointment (1 application Topical Given 02/06/16 2146)     Initial Impression / Assessment and Plan / ED Course  I have reviewed the triage vital signs and the nursing notes.  Pertinent labs & imaging results that were available during my care of the patient were reviewed by me and considered in my medical decision making (see chart for details).  Clinical Course     76 y.o. male presents with 2 falls, 1 yesterday and 1 today. Yesterday he did not lose consciousness and fell onto his left side with immediate pain. Today he was being watched by a family member and told her to go home so he could make dinner and had a fall striking above his right eye and losing consciousness. Not anticoagulated. Has h/o prior hyponatremia and is supposed to be on fluid restriction. CT without acute injuries. Notable rib fractures x3 from yesterday's fall. He had home health set up from prior admission and canceled them prior to evaluation. He has had extensive workup including MR of brain that have been unremarkable thus far. Likely an element of  polypharmacy, is mildly hyponatremic today but currently asymptomatic and not to the same degree as previous. Offered observation admission for pain control of rib fractures and monitoring, Pt prefers to go home but agreed to home health evaluation to help with fall prevention and gait training. He is seen by pain management and will be talking with them about changes given his rib fractures but no hypoxemia and able to take deep breaths here. He has oxycodone at home for pain control. Plan to follow up with PCP as needed and return precautions discussed for worsening or new concerning symptoms.   Final Clinical Impressions(s) / ED  Diagnoses   Final diagnoses:  Fracture of three ribs, left, closed, initial encounter  Facial contusion, initial encounter  Skin tear of left elbow without complication, initial encounter    New Prescriptions New Prescriptions   No medications on file     Leo Grosser, MD 02/07/16 [phone]

## 2016-02-09 ENCOUNTER — Emergency Department (HOSPITAL_COMMUNITY)
Admission: EM | Admit: 2016-02-09 | Discharge: 2016-02-09 | Disposition: A | Discharge status: Home / Self Care | Payer: Medicare Other | Source: Home / Self Care | Attending: Emergency Medicine | Admitting: Emergency Medicine

## 2016-02-09 ENCOUNTER — Observation Stay (HOSPITAL_COMMUNITY)
Admission: EM | Admit: 2016-02-09 | Discharge: 2016-02-11 | Disposition: A | Discharge status: Home / Self Care | Payer: Medicare Other | Attending: Family Medicine | Admitting: Family Medicine

## 2016-02-09 ENCOUNTER — Encounter (HOSPITAL_COMMUNITY): Payer: Self-pay

## 2016-02-09 ENCOUNTER — Emergency Department (HOSPITAL_COMMUNITY): Payer: Medicare Other

## 2016-02-09 ENCOUNTER — Encounter (HOSPITAL_COMMUNITY): Payer: Self-pay | Admitting: Emergency Medicine

## 2016-02-09 DIAGNOSIS — S0511XA Contusion of eyeball and orbital tissues, right eye, initial encounter: Secondary | ICD-10-CM

## 2016-02-09 DIAGNOSIS — S2239XA Fracture of one rib, unspecified side, initial encounter for closed fracture: Secondary | ICD-10-CM | POA: Diagnosis present

## 2016-02-09 DIAGNOSIS — R404 Transient alteration of awareness: Secondary | ICD-10-CM | POA: Diagnosis not present

## 2016-02-09 DIAGNOSIS — G4700 Insomnia, unspecified: Secondary | ICD-10-CM | POA: Diagnosis not present

## 2016-02-09 DIAGNOSIS — Y92009 Unspecified place in unspecified non-institutional (private) residence as the place of occurrence of the external cause: Secondary | ICD-10-CM | POA: Insufficient documentation

## 2016-02-09 DIAGNOSIS — N400 Enlarged prostate without lower urinary tract symptoms: Secondary | ICD-10-CM | POA: Diagnosis not present

## 2016-02-09 DIAGNOSIS — R2681 Unsteadiness on feet: Secondary | ICD-10-CM

## 2016-02-09 DIAGNOSIS — S2242XD Multiple fractures of ribs, left side, subsequent encounter for fracture with routine healing: Secondary | ICD-10-CM | POA: Insufficient documentation

## 2016-02-09 DIAGNOSIS — W19XXXD Unspecified fall, subsequent encounter: Secondary | ICD-10-CM | POA: Diagnosis not present

## 2016-02-09 DIAGNOSIS — W19XXXA Unspecified fall, initial encounter: Secondary | ICD-10-CM

## 2016-02-09 DIAGNOSIS — S0011XA Contusion of right eyelid and periocular area, initial encounter: Secondary | ICD-10-CM | POA: Diagnosis not present

## 2016-02-09 DIAGNOSIS — G3189 Other specified degenerative diseases of nervous system: Secondary | ICD-10-CM | POA: Insufficient documentation

## 2016-02-09 DIAGNOSIS — Z79899 Other long term (current) drug therapy: Secondary | ICD-10-CM | POA: Insufficient documentation

## 2016-02-09 DIAGNOSIS — Y999 Unspecified external cause status: Secondary | ICD-10-CM | POA: Insufficient documentation

## 2016-02-09 DIAGNOSIS — Z87891 Personal history of nicotine dependence: Secondary | ICD-10-CM | POA: Insufficient documentation

## 2016-02-09 DIAGNOSIS — S40012A Contusion of left shoulder, initial encounter: Secondary | ICD-10-CM

## 2016-02-09 DIAGNOSIS — F329 Major depressive disorder, single episode, unspecified: Secondary | ICD-10-CM | POA: Diagnosis not present

## 2016-02-09 DIAGNOSIS — G629 Polyneuropathy, unspecified: Secondary | ICD-10-CM | POA: Diagnosis not present

## 2016-02-09 DIAGNOSIS — R531 Weakness: Secondary | ICD-10-CM | POA: Diagnosis not present

## 2016-02-09 DIAGNOSIS — S59902A Unspecified injury of left elbow, initial encounter: Secondary | ICD-10-CM | POA: Diagnosis not present

## 2016-02-09 DIAGNOSIS — R0781 Pleurodynia: Secondary | ICD-10-CM | POA: Diagnosis not present

## 2016-02-09 DIAGNOSIS — R296 Repeated falls: Secondary | ICD-10-CM

## 2016-02-09 DIAGNOSIS — G9340 Encephalopathy, unspecified: Secondary | ICD-10-CM | POA: Diagnosis not present

## 2016-02-09 DIAGNOSIS — Z981 Arthrodesis status: Secondary | ICD-10-CM | POA: Diagnosis not present

## 2016-02-09 DIAGNOSIS — Y939 Activity, unspecified: Secondary | ICD-10-CM | POA: Insufficient documentation

## 2016-02-09 DIAGNOSIS — G8929 Other chronic pain: Secondary | ICD-10-CM | POA: Diagnosis not present

## 2016-02-09 DIAGNOSIS — M25522 Pain in left elbow: Secondary | ICD-10-CM | POA: Diagnosis not present

## 2016-02-09 DIAGNOSIS — E860 Dehydration: Secondary | ICD-10-CM | POA: Diagnosis not present

## 2016-02-09 DIAGNOSIS — E871 Hypo-osmolality and hyponatremia: Secondary | ICD-10-CM | POA: Diagnosis present

## 2016-02-09 DIAGNOSIS — S4992XA Unspecified injury of left shoulder and upper arm, initial encounter: Secondary | ICD-10-CM | POA: Diagnosis not present

## 2016-02-09 DIAGNOSIS — F32A Depression, unspecified: Secondary | ICD-10-CM | POA: Diagnosis present

## 2016-02-09 DIAGNOSIS — G9341 Metabolic encephalopathy: Secondary | ICD-10-CM | POA: Diagnosis present

## 2016-02-09 DIAGNOSIS — S0990XA Unspecified injury of head, initial encounter: Secondary | ICD-10-CM | POA: Diagnosis not present

## 2016-02-09 DIAGNOSIS — R4182 Altered mental status, unspecified: Principal | ICD-10-CM | POA: Insufficient documentation

## 2016-02-09 DIAGNOSIS — T148XXA Other injury of unspecified body region, initial encounter: Secondary | ICD-10-CM | POA: Diagnosis not present

## 2016-02-09 DIAGNOSIS — R270 Ataxia, unspecified: Secondary | ICD-10-CM

## 2016-02-09 DIAGNOSIS — I5042 Chronic combined systolic (congestive) and diastolic (congestive) heart failure: Secondary | ICD-10-CM | POA: Diagnosis present

## 2016-02-09 DIAGNOSIS — K219 Gastro-esophageal reflux disease without esophagitis: Secondary | ICD-10-CM | POA: Diagnosis not present

## 2016-02-09 DIAGNOSIS — M25512 Pain in left shoulder: Secondary | ICD-10-CM | POA: Diagnosis not present

## 2016-02-09 HISTORY — DX: Chronic combined systolic (congestive) and diastolic (congestive) heart failure: I50.42

## 2016-02-09 LAB — CBC
HCT: 26.6 % — ABNORMAL LOW (ref 39.0–52.0)
Hemoglobin: 9.4 g/dL — ABNORMAL LOW (ref 13.0–17.0)
MCH: 32.4 pg (ref 26.0–34.0)
MCHC: 35.3 g/dL (ref 30.0–36.0)
MCV: 91.7 fL (ref 78.0–100.0)
Platelets: 156 10*3/uL (ref 150–400)
RBC: 2.9 MIL/uL — ABNORMAL LOW (ref 4.22–5.81)
RDW: 13.5 % (ref 11.5–15.5)
WBC: 6 10*3/uL (ref 4.0–10.5)

## 2016-02-09 LAB — CBC WITH DIFFERENTIAL/PLATELET
Basophils Absolute: 0 10*3/uL (ref 0.0–0.1)
Basophils Relative: 0 %
Eosinophils Absolute: 0.2 10*3/uL (ref 0.0–0.7)
Eosinophils Relative: 2 %
HCT: 30.1 % — ABNORMAL LOW (ref 39.0–52.0)
Hemoglobin: 10.4 g/dL — ABNORMAL LOW (ref 13.0–17.0)
Lymphocytes Relative: 6 %
Lymphs Abs: 0.6 10*3/uL — ABNORMAL LOW (ref 0.7–4.0)
MCH: 31.7 pg (ref 26.0–34.0)
MCHC: 34.6 g/dL (ref 30.0–36.0)
MCV: 91.8 fL (ref 78.0–100.0)
Monocytes Absolute: 0.7 10*3/uL (ref 0.1–1.0)
Monocytes Relative: 7 %
Neutro Abs: 9 10*3/uL — ABNORMAL HIGH (ref 1.7–7.7)
Neutrophils Relative %: 85 %
Platelets: 174 10*3/uL (ref 150–400)
RBC: 3.28 MIL/uL — ABNORMAL LOW (ref 4.22–5.81)
RDW: 13.4 % (ref 11.5–15.5)
WBC: 10.5 10*3/uL (ref 4.0–10.5)

## 2016-02-09 LAB — BASIC METABOLIC PANEL
Anion gap: 9 (ref 5–15)
Anion gap: 7 (ref 5–15)
BUN: 13 mg/dL (ref 6–20)
BUN: 14 mg/dL (ref 6–20)
CO2: 26 mmol/L (ref 22–32)
CO2: 26 mmol/L (ref 22–32)
Calcium: 9.1 mg/dL (ref 8.9–10.3)
Calcium: 8.3 mg/dL — ABNORMAL LOW (ref 8.9–10.3)
Chloride: 94 mmol/L — ABNORMAL LOW (ref 101–111)
Chloride: 96 mmol/L — ABNORMAL LOW (ref 101–111)
Creatinine, Ser: 1.16 mg/dL (ref 0.61–1.24)
Creatinine, Ser: 0.8 mg/dL (ref 0.61–1.24)
GFR calc Af Amer: >60 mL/min (ref >60)
GFR calc Af Amer: >60 mL/min (ref >60)
GFR calc non Af Amer: >60 mL/min (ref >60)
GFR calc non Af Amer: >60 mL/min (ref >60)
Glucose, Bld: 131 mg/dL — ABNORMAL HIGH (ref 65–99)
Glucose, Bld: 101 mg/dL — ABNORMAL HIGH (ref 65–99)
Potassium: 4.3 mmol/L (ref 3.5–5.1)
Potassium: 3.5 mmol/L (ref 3.5–5.1)
Sodium: 129 mmol/L — ABNORMAL LOW (ref 135–145)
Sodium: 129 mmol/L — ABNORMAL LOW (ref 135–145)

## 2016-02-09 LAB — CBG MONITORING, ED: Glucose-Capillary: 82 mg/dL (ref 65–99)

## 2016-02-09 MED ORDER — SODIUM CHLORIDE 0.9 % IV BOLUS (SEPSIS)
1000.0000 mL | Freq: Once | INTRAVENOUS | Status: AC
  Administered 2016-02-09: 1000 mL via INTRAVENOUS

## 2016-02-09 NOTE — BH Assessment (Signed)
Tele Assessment Note   Dustin [name] is an 75 y.o. male presenting to [facility] after multiple falls. Pt denies SI, HI and AVH at this time. No previous suicide attempts or self -injurious behaviors reported. Pt reported that he is diagnosed with depression. Pt reported that he is compliant with his medication. No psychiatric hospitalizations reported. No drugs or alcohol use reported. Pt reported  That his appetite has been terrible No physical, sexual or emotional abuse reported. Pt is oriented to name, date of birth, month and year. 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   Collateral information was gathered from pt's son and daughter in law Ronalee Belts and Tedman Lilly)  Who reported that pt was at the hospital earlier today due to shoulder pain. Pt has had multiple fall over the past 3 week and he was concern that he may have broken his shoulder. They shared that pt's shoulder was x-rayed and everything came back fine. They shared when they arrive home pt thought it was night time and wanted to go to bed and when he woke up pt was ready to eat breakfast. They also shared that pt has not had much of an appetite over the past 2 days and shared that he may have ate twice over the past two days. They also shared that pt has not being using the restroom as frequently and reported that he has gone to the restroom twice over the past two days. They reported that some day pt will be unable to walk and other days he walks just fine. They also reported that pt will have tremors and other days he doesn't. They also reported that pt has a black eye from his fall on Tuesday.   Diagnosis: Major Depressive Disorder per report.  Past Medical History:  Past Medical History:  Diagnosis Date  . BPH (benign prostatic hyperplasia)   . GERD (gastroesophageal reflux disease)    protonix for control  . Insomnia   . Neck pain   . Neuropathy [facility])     Past Surgical History:   Procedure Laterality Date  . ANTERIOR CERVICAL DECOMPRESSION/DISCECTOMY FUSION 4 LEVELS N/A 10/27/2012   Procedure: Cervical Three-Four Cervical Four-Five Cervical Five-Six Cervical Six-Seven  Anterior cervical decompression/diskectomy/fusion;  Surgeon: Erline Levine, MD;  Location: [place] [facility];  Service: Neurosurgery;  Laterality: N/A;  Cervical Three-Four Cervical Four-Five Cervical Five-Six Cervical Six-Seven  Anterior cervical decompression/diskectomy/fusion  . SHOULDER ARTHROSCOPY  2010   rt shoulder  . SHOULDER ARTHROSCOPY  02/15/2011   Procedure: ARTHROSCOPY SHOULDER;  Surgeon: Cynda Familia;  Location: [place];  Service: Orthopedics;  Laterality: Right;  Debridement  . TONSILLECTOMY      Family History: History reviewed. No pertinent family history.  Social History:  reports that he quit smoking about 26 years ago. He smoked 1.50 packs per day. He has never used smokeless tobacco. He reports that he does not drink alcohol or use drugs.  Additional Social History:  Alcohol / Drug Use History of alcohol / drug use?: No history of alcohol / drug abuse  CIWA: CIWA-Ar BP: 146/87 Pulse Rate: 87 COWS:    PATIENT STRENGTHS: (choose at least two) Average or above average intelligence Supportive family/friends  Allergies: No Known Allergies  Home Medications:  (Not in a hospital admission)  OB/GYN Status:  No LMP for male patient.  General Assessment Data Location of Assessment: [facility] ED TTS Assessment: In system Is this a Tele  or Face-to-Face Assessment?: Face-to-Face Is this an Initial Assessment or a Re-assessment for this encounter?: Initial Assessment Marital status: Married Living Arrangements: Spouse/significant other Can pt return to current living arrangement?: Yes Admission Status: Voluntary Is patient capable of signing voluntary admission?: Yes Referral Source: Self/Family/Friend Insurance type: Medicare     Crisis Care Plan Living  Arrangements: Spouse/significant other Name of Psychiatrist: Dr. Liz Beach  Name of Therapist: No provider reported.   Education Status Is patient currently in school?: No  Risk to self with the past 6 months Suicidal Ideation: No Has patient been a risk to self within the past 6 months prior to admission? : No Suicidal Intent: No Has patient had any suicidal intent within the past 6 months prior to admission? : No Is patient at risk for suicide?: No Suicidal Plan?: No Has patient had any suicidal plan within the past 6 months prior to admission? : No Access to Means: No Previous Attempts/Gestures: No How many times?: 0 Other Self Harm Risks: Denies  Triggers for Past Attempts: None known (No previous attempts reported. ) Intentional Self Injurious Behavior: None Family Suicide History: No Recent stressful life event(s):  (No stressors reported.) Persecutory voices/beliefs?: No Depression: No Substance abuse history and/or treatment for substance abuse?: No Suicide prevention information given to non-admitted patients: Not applicable  Risk to Others within the past 6 months Homicidal Ideation: No Does patient have any lifetime risk of violence toward others beyond the six months prior to admission? : No Thoughts of Harm to Others: No Current Homicidal Intent: No Current Homicidal Plan: No Access to Homicidal Means: No History of harm to others?: No Assessment of Violence: None Noted Violent Behavior Description: No violent behaviors observed. Pt is calm and cooperative at this time.  Does patient have access to weapons?: Yes (Comment) (Secured in a safe. ) Criminal Charges Pending?: No Does patient have a court date: No Is patient on probation?: No  Psychosis Hallucinations: None noted Delusions: None noted  Mental Status Report Appearance/Hygiene: Unremarkable Eye Contact: Good Motor Activity: Tremors Speech: Logical/coherent Level of Consciousness: Alert Mood:  Pleasant Affect: Appropriate to circumstance Anxiety Level: None Thought Processes: Coherent, Relevant Judgement: Unimpaired Orientation: Person, Place, Time Obsessive Compulsive Thoughts/Behaviors: None  Cognitive Functioning Concentration: Normal Memory: Recent Intact, Remote Intact IQ: Average Insight: Good Impulse Control: Good Appetite: Poor Weight Loss: 0 Weight Gain: 0 Sleep: No Change Total Hours of Sleep: 8 (with medication ) Vegetative Symptoms: None  ADLScreening [facility] Assessment Services) Patient's cognitive ability adequate to safely complete daily activities?: Yes Patient able to express need for assistance with ADLs?: Yes Independently performs ADLs?: Yes (appropriate for developmental age)  Prior Inpatient Therapy Prior Inpatient Therapy: No  Prior Outpatient Therapy Prior Outpatient Therapy: Yes Prior Therapy Dates: 2015- current  Prior Therapy Facilty/Provider(s): Dr. Liz Beach  Reason for Treatment: Depression  Does patient have an ACCT team?: No Does patient have Intensive In-House Services?  : No Does patient have [facility] services? : No Does patient have P4CC services?: No  ADL Screening (condition at time of admission) Patient's cognitive ability adequate to safely complete daily activities?: Yes Is the patient deaf or have difficulty hearing?: Yes (Difficulty hearing without hearing aid. ) Does the patient have difficulty seeing, even when wearing glasses/contacts?: No Does the patient have difficulty concentrating, remembering, or making decisions?: No Patient able to express need for assistance with ADLs?: Yes Does the patient have difficulty dressing or bathing?: Yes Independently performs ADLs?: Yes (appropriate for developmental age)  Abuse/Neglect Assessment (Assessment to be complete while patient is alone) Physical Abuse: Denies Verbal Abuse: Denies Sexual Abuse: Denies Exploitation of patient/patient's resources:  Denies Self-Neglect: Denies     Regulatory affairs officer (For Healthcare) Does Patient Have a Medical Advance Directive?: No    Additional Information 1:1 In Past 12 Months?: No CIRT Risk: No Elopement Risk: No     Disposition:  Disposition Initial Assessment Completed for this Encounter: Yes Disposition of Patient: Outpatient treatment (Continue with current provider )  Khyleigh Furney S 02/09/2016 11:22 PM

## 2016-02-09 NOTE — ED Notes (Signed)
Bed: [id number] Expected date:  Expected time:  Means of arrival:  Comments: 75 yr old, weakness, hx of multiple falls

## 2016-02-09 NOTE — ED Triage Notes (Signed)
Pt had an unwitnessed fall today at home. Unknown LOC, hy of recent falls and here on Tuesday for the same thing. Bruise on his R eye and bruised L ribs present upon arrival from previous fall. Pain right now at a 2 in the L shoulder

## 2016-02-09 NOTE — ED Provider Notes (Signed)
[place] DEPT Provider Note   CSN: [id number] Arrival date & time: 02/09/16  1237     History   Chief Complaint Chief Complaint  Patient presents with  . Fall    HPI Dustin [name] is a 75 y.o. male.  HPI Patient presents after a fall. The patient is an elderly male, with progressive weakness and gait instability, has had multiple falls in the recent past. Today, the patient had a fall approximately 8 hours ago, full was not witnessed, but family members heard him fall. Patient does not recall the events surrounding the fall, but is oriented otherwise, answers questions properly, states that he was well prior to the fall, currently has pain in his left shoulder, less in the left elbow, no new weakness in any extremities, no new confusion, disorientation, word finding difficulty, no new vision loss. Acknowledges his history of increasing falls, weakness and anorexia, denies abdominal pain, chest pain.  Past Medical History:  Diagnosis Date  . BPH (benign prostatic hyperplasia)   . GERD (gastroesophageal reflux disease)    protonix for control  . Insomnia   . Neck pain   . Neuropathy [facility])     Patient Active Problem List   Diagnosis Date Noted  . Ataxia   . Hyponatremia 01/22/2016  . Gait instability 01/22/2016  . Urinary retention   . Stroke-like symptom   . Syncope 05/02/2015  . Closed left fibular fracture 05/02/2015  . GERD (gastroesophageal reflux disease) 05/02/2015  . BPH (benign prostatic hyperplasia) 05/02/2015  . Fracture of distal end of left fibula   . 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 Depression 01/30/2015  . 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 ANTERIOR CERVICAL DECOMPRESSION/DISCECTOMY FUSION 4 LEVELS N/A 10/27/2012   Procedure: Cervical Three-Four Cervical Four-Five Cervical Five-Six Cervical Six-Seven  Anterior cervical decompression/diskectomy/fusion;  Surgeon: Erline Levine, MD;  Location:  [place] [facility];  Service: Neurosurgery;  Laterality: N/A;  Cervical Three-Four Cervical Four-Five Cervical Five-Six Cervical Six-Seven  Anterior cervical decompression/diskectomy/fusion  . SHOULDER ARTHROSCOPY  2010   rt shoulder  . SHOULDER ARTHROSCOPY  02/15/2011   Procedure: ARTHROSCOPY SHOULDER;  Surgeon: Cynda Familia;  Location: [place];  Service: Orthopedics;  Laterality: Right;  Debridement  . TONSILLECTOMY         Home Medications    Prior to Admission medications   Medication Sig Start Date End Date Taking? Authorizing Provider  ARIPiprazole (ABILIFY) 2 MG tablet Take 2 mg by mouth daily.    Historical Provider, MD  buPROPion (WELLBUTRIN XL) 300 MG 24 hr tablet Take 300 mg by mouth daily.    Historical Provider, MD  cholecalciferol (VITAMIN D) 1000 UNITS tablet Take 1,000 Units by mouth daily.     Historical Provider, MD  finasteride (PROSCAR) 5 MG tablet Take 5 mg by mouth daily.      Historical Provider, MD  L-Methylfolate-Algae-B12-B6 Glade Stanford) 3-90.314-2-35 MG CAPS Take one capsule daily. 11/02/15   Marcial Pacas, MD  lamoTRIgine (LAMICTAL) 100 MG tablet Take 100 mg by mouth daily.    Historical Provider, MD  nortriptyline (PAMELOR) 25 MG capsule Take 2 capsules (50 mg total) by mouth at bedtime. 09/25/15   Dennie Bible, NP  OXcarbazepine (TRILEPTAL) 150 MG tablet Take 1 tablet (150 mg total) by mouth 2 (two) times daily. 09/25/15   Dennie Bible, NP  oxyCODONE-acetaminophen (PERCOCET) 10-325 MG tablet Take 1 tablet by mouth every 6 (six) hours as needed for  pain. Patient taking differently: Take 1 tablet by mouth 3 (three) times daily.  05/03/15   Ripudeep K Rai, MD  OXYCONTIN 20 MG T12A 12 hr tablet TAKE 1 TABLET BY ORAL ROUTE EVERY 12 HOURS (DNF 09/24/14) 09/27/14   Historical Provider, MD  pantoprazole (PROTONIX) 40 MG tablet Take 40 mg by mouth daily.      Historical Provider, MD  polyethylene glycol (MIRALAX / GLYCOLAX) packet Take 17 g by mouth  daily as needed for mild constipation. 05/03/15   Ripudeep Krystal Eaton, MD  QUEtiapine (SEROQUEL) 300 MG tablet Take 600 mg by mouth at bedtime. Reported on 05/10/2015    Historical Provider, MD  rOPINIRole (REQUIP) 1 MG tablet Take 1 tablet (1 mg total) by mouth 3 (three) times daily. 01/31/16 03/01/16  Gibson Ramp, MD  temazepam (RESTORIL) 15 MG capsule Take 15 mg by mouth at bedtime.    Historical Provider, MD  Vilazodone HCl (VIIBRYD) 40 MG TABS Take 40 mg by mouth daily.    Historical Provider, MD  vitamin B-12 (CYANOCOBALAMIN) 1000 MCG tablet Take 1,000 mcg by mouth daily.    Historical Provider, MD    Family History No family history on file.  Social History Social History  Substance Use Topics  . Smoking status: Former Smoker    Packs/day: 1.50    Quit date: 01/27/1990  . Smokeless tobacco: Never Used  . Alcohol use No     Allergies   Patient has no known allergies.   Review of Systems Review of Systems  Constitutional:       Per HPI, otherwise negative  HENT:       Per HPI, otherwise negative  Respiratory:       Per HPI, otherwise negative  Cardiovascular:       Per HPI, otherwise negative  Gastrointestinal: Negative for vomiting.  Endocrine:       Negative aside from HPI  Genitourinary:       Neg aside from HPI   Musculoskeletal:       Per HPI, otherwise negative  Skin: Negative.   Neurological: Positive for weakness. Negative for syncope.  Hematological: Bruises/bleeds easily.     Physical Exam Updated Vital Signs Ht 6\' 2"  (1.88 m)   Physical Exam  Constitutional: He is oriented to person, place, and time. He appears well-developed. He has a sickly appearance. No distress.  HENT:  Head: Normocephalic and atraumatic.    Eyes: Conjunctivae and EOM are normal.  Neck: Full passive range of motion without pain. Neck supple. No spinous process tenderness and no muscular tenderness present. Normal range of motion present.  Cardiovascular: Normal rate and  regular rhythm.   Pulmonary/Chest: Effort normal. No stridor. No respiratory distress.  Abdominal: He exhibits no distension.  Musculoskeletal: He exhibits no edema.       Right shoulder: Normal.       Left wrist: Normal.       Right hip: Normal.       Left hip: Normal.       Arms: Neurological: He is alert and oriented to person, place, and time. He displays atrophy. He displays no tremor. He exhibits normal muscle tone. He displays no seizure activity.  Skin: Skin is warm and dry.     Psychiatric: He has a normal mood and affect.  Nursing note and vitals reviewed.    ED Treatments / Results  Labs (all labs ordered are listed, but only abnormal results are displayed) Labs Reviewed  BASIC METABOLIC PANEL -  Abnormal; Notable for the following:       Result Value   Sodium 129 (*)    Chloride 94 (*)    Glucose, Bld 131 (*)    All other components within normal limits  CBC WITH DIFFERENTIAL/PLATELET - Abnormal; Notable for the following:    RBC 3.28 (*)    Hemoglobin 10.4 (*)    HCT 30.1 (*)    Neutro Abs 9.0 (*)    Lymphs Abs 0.6 (*)    All other components within normal limits  URINALYSIS, ROUTINE W REFLEX MICROSCOPIC   Chart review notable for 5 prior visits in 6 months, most recently for a similar fall. EKG  EKG Interpretation  Date/Time:  Friday February 09 2016 12:48:59 EST Ventricular Rate:  96 PR Interval:    QRS Duration: 93 QT Interval:  339 QTC Calculation: 429 R Axis:   -21 Text Interpretation:  Sinus rhythm Borderline left axis deviation Low voltage, extremity leads T wave abnormality No significant change since last tracing Abnormal ekg Confirmed by Carmin Muskrat  MD [phone]) on 02/09/2016 1:15:46 PM       Radiology Dg Elbow Complete Left  Result Date: 02/09/2016 CLINICAL DATA:  Pain following fall EXAM: LEFT ELBOW - COMPLETE 3+ VIEW COMPARISON:  None. FINDINGS: Frontal, lateral, and bilateral oblique views were obtained. There is no demonstrable  acute fracture or dislocation. There is soft tissue swelling. No joint effusion. There is calcification along the lateral distal humeral condyle, likely due to calcific tendinosis. There is calcification more laterally in the soft tissues, likely residua of prior trauma. There is no appreciable joint space narrowing or erosion. IMPRESSION: No acute fracture or dislocation. 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 Calcifications more laterally in the soft tissues at this level likely is residua of old trauma. There is soft tissue swelling. Electronically Signed   By: Lowella Grip III M.D.   On: 02/09/2016 13:58   Dg Shoulder Left  Result Date: 02/09/2016 CLINICAL DATA:  Pain following fall EXAM: LEFT SHOULDER - 2+ VIEW COMPARISON:  Jun 19, 2007 FINDINGS: Frontal, Y scapular, and axillary images were obtained. There is no acute fracture or dislocation. 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 No erosive change. Visualized left lung is clear. There is postoperative change in the lower cervical spine. IMPRESSION: Mild generalized osteoarthritic change. No acute fracture or dislocation. Electronically Signed   By: Lowella Grip III M.D.   On: 02/09/2016 13:59    Procedures Procedures (including critical care time)  Medications Ordered in ED Medications  sodium chloride 0.9 % bolus 1,000 mL (1,000 mLs Intravenous New Bag/Given 02/09/16 1253)     Initial Impression / Assessment and Plan / ED Course  I have reviewed the triage vital signs and the nursing notes.  Pertinent labs & imaging results that were available during my care of the patient were reviewed by me and considered in my medical decision making (see chart for details).   On repeat exam patient is in no distress, awake, alert, speaking clearly Clinical Course    Patient is notable history of multiple falls recently, and there is generalized  weakness. Here he is awake, alert, no evidence for neurologic dysfunction. Patient is in multiple areas of soreness, ecchymosis, though these are likely acute on chronic, with no evidence for fracture. Some mild evidence for dehydration, but no substantive changes from baseline of what abnormalities. I had a lengthy conversation with patient's family  about appropriate oral resuscitation, primary care and neurology follow-up, the patient will be referred to neurology outpatient.  Final Clinical Impressions(s) / ED Diagnoses  Fall elderly male presents after a fall.     Carmin Muskrat, MD 02/09/16 1535

## 2016-02-09 NOTE — Discharge Instructions (Signed)
As discussed, your evaluation today has been largely reassuring.  But, it is important that you monitor your condition carefully, and do not hesitate to return to the ED if you develop new, or concerning changes in your condition.  Recall that is important to follow-up with your primary care physician, as well as with neurology. Drink plenty of fluids, and be sure to work with your physical therapist to improve your gait stability.

## 2016-02-09 NOTE — ED Provider Notes (Signed)
[facility] DEPT Provider Note   CSN: [id number] Arrival date & time: 02/09/16  2118     History   Chief Complaint Chief Complaint  Patient presents with  . Weakness    x 3 days post fall    HPI Dustin [name] is a 74 y.o. male.  The history is provided by a relative.  Weakness  Primary symptoms comment: generalized. This is a new problem. Episode onset: 3 weeks. There was no focality noted. There has been no fever. Associated symptoms include altered mental status and confusion. Associated medical issues do not include seizures or CVA.  Altered Mental Status   This is a new problem. The current episode started 6 to 12 hours ago. The problem has not changed since onset.Associated symptoms include confusion, weakness, agitation and delusions. Pertinent negatives include no unresponsiveness. Risk factors: frequent falls. His past medical history is significant for depression, psychotropic medication treatment and head trauma. His past medical history does not include seizures, CVA or TIA.    Past Medical History:  Diagnosis Date  . BPH (benign prostatic hyperplasia)   . GERD (gastroesophageal reflux disease)    protonix for control  . Insomnia   . Neck pain   . Neuropathy [facility])     Patient Active Problem List   Diagnosis Date Noted  . Fall 02/10/2016  . Rib fracture 02/10/2016  . Ataxia   . Hyponatremia 01/22/2016  . Gait instability 01/22/2016  . Urinary retention   . Stroke-like symptom   . Syncope 05/02/2015  . Closed left fibular fracture 05/02/2015  . GERD (gastroesophageal reflux disease) 05/02/2015  . BPH (benign prostatic hyperplasia) 05/02/2015  . Fracture of distal end of left fibula   . 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 Depression 01/30/2015  . 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 ANTERIOR CERVICAL DECOMPRESSION/DISCECTOMY FUSION 4 LEVELS N/A 10/27/2012   Procedure: Cervical Three-Four Cervical  Four-Five Cervical Five-Six Cervical Six-Seven  Anterior cervical decompression/diskectomy/fusion;  Surgeon: Erline Levine, MD;  Location: [place] [facility]URO ORS;  Service: Neurosurgery;  Laterality: N/A;  Cervical Three-Four Cervical Four-Five Cervical Five-Six Cervical Six-Seven  Anterior cervical decompression/diskectomy/fusion  . SHOULDER ARTHROSCOPY  2010   rt shoulder  . SHOULDER ARTHROSCOPY  02/15/2011   Procedure: ARTHROSCOPY SHOULDER;  Surgeon: Cynda Familia;  Location: [facility];  Service: Orthopedics;  Laterality: Right;  Debridement  . TONSILLECTOMY         Home Medications    Prior to Admission medications   Medication Sig Start Date End Date Taking? Authorizing Provider  buPROPion (WELLBUTRIN XL) 300 MG 24 hr tablet Take 300 mg by mouth daily.   Yes Historical Provider, MD  cholecalciferol (VITAMIN D) 1000 UNITS tablet Take 1,000 Units by mouth daily.    Yes Historical Provider, MD  finasteride (PROSCAR) 5 MG tablet Take 5 mg by mouth daily.     Yes Historical Provider, MD  L-Methylfolate-Algae-B12-B6 Glade Stanford) 3-90.314-2-35 MG CAPS Take one capsule daily. 11/02/15  Yes Marcial Pacas, MD  lamoTRIgine (LAMICTAL) 100 MG tablet Take 100 mg by mouth daily.   Yes Historical Provider, MD  nortriptyline (PAMELOR) 25 MG capsule Take 2 capsules (50 mg total) by mouth at bedtime. 09/25/15  Yes Dennie Bible, NP  Omega-3 Fatty Acids (FISH OIL PO) Take 1 tablet by mouth daily.   Yes Historical Provider, MD  OXcarbazepine (TRILEPTAL) 150 MG tablet Take 1 tablet (150 mg total) by mouth 2 (two) times daily. 09/25/15  Yes Dennie Bible, NP  oxyCODONE-acetaminophen (PERCOCET) 10-325 MG tablet Take 1 tablet by mouth every 6 (six) hours as needed for pain. Patient taking differently: Take 1 tablet by mouth 3 (three) times daily.  05/03/15  Yes Ripudeep K Rai, MD  OXYCONTIN 20 MG T12A 12 hr tablet TAKE 1 TABLET BY ORAL ROUTE EVERY 12 HOURS (DNF 09/24/14) 09/27/14  Yes Historical  Provider, MD  pantoprazole (PROTONIX) 40 MG tablet Take 40 mg by mouth daily.     Yes Historical Provider, MD  polyethylene glycol (MIRALAX / GLYCOLAX) packet Take 17 g by mouth daily as needed for mild constipation. 05/03/15  Yes Ripudeep Krystal Eaton, MD  QUEtiapine (SEROQUEL) 300 MG tablet Take 600 mg by mouth at bedtime. Reported on 05/10/2015   Yes Historical Provider, MD  temazepam (RESTORIL) 15 MG capsule Take 15 mg by mouth at bedtime.   Yes Historical Provider, MD  vitamin B-12 (CYANOCOBALAMIN) 1000 MCG tablet Take 1,000 mcg by mouth daily.   Yes Historical Provider, MD  rOPINIRole (REQUIP) 1 MG tablet Take 1 tablet (1 mg total) by mouth 3 (three) times daily. Patient not taking: Reported on 02/09/2016 01/31/16 03/01/16  Gibson Ramp, MD    Family History History reviewed. No pertinent family history.  Social History Social History  Substance Use Topics  . Smoking status: Former Smoker    Packs/day: 1.50    Quit date: 01/27/1990  . Smokeless tobacco: Never Used  . Alcohol use No     Allergies   Patient has no known allergies.   Review of Systems Review of Systems  Neurological: Positive for weakness.  Psychiatric/Behavioral: Positive for agitation and confusion.  Ten systems are reviewed and are negative for acute change except as noted in the HPI    Physical Exam Updated Vital Signs BP 146/87 (BP Location: Right Arm)   Pulse 87   Temp 97.6 F (36.4 C) (Oral)   Resp 18   SpO2 95%   Physical Exam  Constitutional: He is oriented to person, place, and time. He appears well-developed and well-nourished. No distress.  HENT:  Head: Normocephalic. Head is with contusion.    Right Ear: External ear normal.  Left Ear: External ear normal.  Mouth/Throat: Oropharynx is clear and moist.  Eyes: Conjunctivae and EOM are normal. Pupils are equal, round, and reactive to light. Right eye exhibits no discharge. Left eye exhibits no discharge. No scleral icterus.  Neck: Normal range  of motion. Neck supple.  Cardiovascular: Regular rhythm and normal heart sounds.  Exam reveals no gallop and no friction rub.   No murmur heard. Pulses:      Radial pulses are 2+ on the right side, and 2+ on the left side.       Dorsalis pedis pulses are 2+ on the right side, and 2+ on the left side.  Pulmonary/Chest: Effort normal and breath sounds normal. No stridor. No respiratory distress.  Abdominal: Soft. He exhibits no distension. There is no tenderness.  Musculoskeletal:       Cervical back: He exhibits no bony tenderness.       Thoracic back: He exhibits no bony tenderness.       Lumbar back: He exhibits no bony tenderness.       Arms: Clavicle stable. Chest stable to AP/Lat compression. Pelvis stable to Lat compression. No obvious extremity deformity. No chest or abdominal wall contusion.  Neurological: He is alert and oriented to person, place, and time. GCS eye subscore is 4. GCS  verbal subscore is 5. GCS motor subscore is 6.  Mental Status: Alert and oriented to person, place, and time. Attention and concentration normal. Speech clear. Recent memory is intac  Cranial Nerves  II Visual Fields: Intact to confrontation. Visual fields intact. III, IV, VI: Pupils equal and reactive to light and near. Full eye movement without nystagmus  V Facial Sensation: Normal. No weakness of masticatory muscles  VII: No facial weakness or asymmetry  VIII Auditory Acuity: Grossly normal  IX/X: The uvula is midline; the palate elevates symmetrically  XI: Normal sternocleidomastoid and trapezius strength  XII: The tongue is midline. No atrophy or fasciculations.   Motor System: Muscle Strength: 5/5 and symmetric in the upper and lower extremities. No pronation or drift.  Muscle Tone: Tone and muscle bulk are normal in the upper and lower extremities.   Reflexes: DTRs:1+ and symmetrical in all four extremities. Plantar responses are flexor bilaterally.  Coordination: Intact finger-to-nose,  abnormal heel-to-shin,  + tremor.  Sensation: Intact to light touch, and pinprick.  +Romberg test.  Gait: unsteady on his feet and unable to ambulate    Skin: Skin is warm. He is not diaphoretic.     ED Treatments / Results  Labs (all labs ordered are listed, but only abnormal results are displayed) Labs Reviewed  BASIC METABOLIC PANEL - Abnormal; Notable for the following:       Result Value   Sodium 129 (*)    Chloride 96 (*)    Glucose, Bld 101 (*)    Calcium 8.3 (*)    All other components within normal limits  CBC - Abnormal; Notable for the following:    RBC 2.90 (*)    Hemoglobin 9.4 (*)    HCT 26.6 (*)    All other components within normal limits  URINALYSIS, ROUTINE W REFLEX MICROSCOPIC - Abnormal; Notable for the following:    Glucose, UA 50 (*)    All other components within normal limits  CBG MONITORING, ED    EKG  EKG Interpretation None       Radiology Dg Elbow Complete Left  Result Date: 02/09/2016 CLINICAL DATA:  Pain following fall EXAM: LEFT ELBOW - COMPLETE 3+ VIEW COMPARISON:  None. FINDINGS: Frontal, lateral, and bilateral oblique views were obtained. There is no demonstrable acute fracture or dislocation. There is soft tissue swelling. No joint effusion. There is calcification along the lateral distal humeral condyle, likely due to calcific tendinosis. There is calcification more laterally in the soft tissues, likely residua of prior trauma. There is no appreciable joint space narrowing or erosion. IMPRESSION: No acute fracture or dislocation. 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 Calcifications more laterally in the soft tissues at this level likely is residua of old trauma. There is soft tissue swelling. Electronically Signed   By: Lowella Grip III M.D.   On: 02/09/2016 13:58   Ct Head Wo Contrast  Result Date: 02/09/2016 CLINICAL DATA:  Progressive weakness and gait  instability. Multiple recent falls. Unwitnessed fall approximately 8 hours ago. EXAM: CT HEAD WITHOUT CONTRAST TECHNIQUE: Contiguous axial images were obtained from the base of the skull through the vertex without intravenous contrast. COMPARISON:  02/06/2016, 01/22/2016 FINDINGS: Brain: There is no intracranial hemorrhage, mass or evidence of acute infarction. There is mild generalized atrophy. There is mild chronic microvascular ischemic change. There is no significant extra-axial fluid collection. No acute intracranial findings are evident. Vascular: No hyperdense vessel or unexpected  calcification. Skull: Normal. Negative for fracture or focal lesion. Sinuses/Orbits: No acute finding. Other: None. IMPRESSION: No acute intracranial findings. There is mild generalized atrophy and chronic appearing white matter hypodensities which likely represent small vessel ischemic disease. Electronically Signed   By: Andreas Newport M.D.   On: 02/09/2016 23:45   Dg Shoulder Left  Result Date: 02/09/2016 CLINICAL DATA:  Pain following fall EXAM: LEFT SHOULDER - 2+ VIEW COMPARISON:  Jun 19, 2007 FINDINGS: Frontal, Y scapular, and axillary images were obtained. There is no acute fracture or dislocation. 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 No erosive change. Visualized left lung is clear. There is postoperative change in the lower cervical spine. IMPRESSION: Mild generalized osteoarthritic change. No acute fracture or dislocation. Electronically Signed   By: Lowella Grip III M.D.   On: 02/09/2016 13:59    Procedures Procedures (including critical care time)  Medications Ordered in ED Medications - No data to display   Initial Impression / Assessment and Plan / ED Course  I have reviewed the triage vital signs and the nursing notes.  Pertinent labs & imaging results that were available during my care of the patient were reviewed by me and considered in my medical decision making (see chart for  details).  Clinical Course    Patient not currently altered.  Frequent fall with unsteady gait. CT head negative.Seen previously for the same with negative stroke workup.  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 Low suspicion for transverse myelitis. Possible medication side effect versus movement disorder vs psychogenic.  Neurology consulted.  Will admit for further work up given frequent falls.  Final Clinical Impressions(s) / ED Diagnoses   Final diagnoses:  Frequent falls  Unsteady gait     Fatima Blank, MD 02/10/16 [phone]

## 2016-02-09 NOTE — ED Triage Notes (Addendum)
Per EMS , pt. From home, family reported pt. Noted weakness after fall on Tuesday , seen by MD,pt. Had left rib fracture , went to see his Primary MD yesterday for complaint of weakness, Xray was negative.  This evening ,per pt.'s family ,weakness was getting worse that pt. Is even too weak to use the bathroom. Alert and oriented x1. With old bruising on right eye. No S/S of SOB.

## 2016-02-09 NOTE — BH Assessment (Signed)
Assessment completed. Consulted Lindon Romp, FNP who agrees that pt does not meet inpatient criteria. Informed Dr. Leonette Monarch that pt does not meet inpatient criteria.

## 2016-02-10 ENCOUNTER — Observation Stay (HOSPITAL_COMMUNITY): Payer: Medicare Other

## 2016-02-10 ENCOUNTER — Encounter (HOSPITAL_COMMUNITY): Payer: Self-pay | Admitting: Internal Medicine

## 2016-02-10 DIAGNOSIS — I5042 Chronic combined systolic (congestive) and diastolic (congestive) heart failure: Secondary | ICD-10-CM | POA: Diagnosis present

## 2016-02-10 DIAGNOSIS — N401 Enlarged prostate with lower urinary tract symptoms: Secondary | ICD-10-CM

## 2016-02-10 DIAGNOSIS — G9340 Encephalopathy, unspecified: Secondary | ICD-10-CM | POA: Diagnosis not present

## 2016-02-10 DIAGNOSIS — R270 Ataxia, unspecified: Secondary | ICD-10-CM | POA: Diagnosis not present

## 2016-02-10 DIAGNOSIS — E871 Hypo-osmolality and hyponatremia: Secondary | ICD-10-CM

## 2016-02-10 DIAGNOSIS — K219 Gastro-esophageal reflux disease without esophagitis: Secondary | ICD-10-CM

## 2016-02-10 DIAGNOSIS — G9341 Metabolic encephalopathy: Secondary | ICD-10-CM | POA: Diagnosis present

## 2016-02-10 DIAGNOSIS — W19XXXA Unspecified fall, initial encounter: Secondary | ICD-10-CM | POA: Diagnosis present

## 2016-02-10 DIAGNOSIS — S2239XA Fracture of one rib, unspecified side, initial encounter for closed fracture: Secondary | ICD-10-CM | POA: Diagnosis present

## 2016-02-10 DIAGNOSIS — W19XXXD Unspecified fall, subsequent encounter: Secondary | ICD-10-CM | POA: Diagnosis not present

## 2016-02-10 DIAGNOSIS — R4182 Altered mental status, unspecified: Secondary | ICD-10-CM | POA: Diagnosis not present

## 2016-02-10 LAB — GLUCOSE, CAPILLARY: Glucose-Capillary: 109 mg/dL — ABNORMAL HIGH (ref 65–99)

## 2016-02-10 LAB — URINALYSIS, ROUTINE W REFLEX MICROSCOPIC
Bilirubin Urine: NEGATIVE
Glucose, UA: 50 mg/dL — AB
Hgb urine dipstick: NEGATIVE
Ketones, ur: NEGATIVE mg/dL
Leukocytes, UA: NEGATIVE
Nitrite: NEGATIVE
Protein, ur: NEGATIVE mg/dL
Specific Gravity, Urine: 1.014 (ref 1.005–1.030)
pH: 6 (ref 5.0–8.0)

## 2016-02-10 LAB — BRAIN NATRIURETIC PEPTIDE: B Natriuretic Peptide: 17.3 pg/mL (ref 0.0–100.0)

## 2016-02-10 LAB — CBG MONITORING, ED: Glucose-Capillary: 69 mg/dL (ref 65–99)

## 2016-02-10 LAB — OSMOLALITY, URINE: Osmolality, Ur: 432 mOsm/kg (ref 300–900)

## 2016-02-10 LAB — SODIUM, URINE, RANDOM: Sodium, Ur: 69 mmol/L

## 2016-02-10 MED ORDER — OXYCODONE-ACETAMINOPHEN 5-325 MG PO TABS: 1.0000 | ORAL_TABLET | Freq: Four times a day (QID) | ORAL | Status: DC | PRN

## 2016-02-10 MED ORDER — ENOXAPARIN SODIUM 40 MG/0.4ML ~~LOC~~ SOLN
40.0000 mg | Freq: Every day | SUBCUTANEOUS | Status: DC
  Administered 2016-02-10 – 2016-02-11 (×2): 40 mg via SUBCUTANEOUS
  Filled 2016-02-10 (×2): qty 0.4

## 2016-02-10 MED ORDER — POLYETHYLENE GLYCOL 3350 17 G PO PACK: 17.0000 g | PACK | Freq: Every day | ORAL | Status: DC | PRN

## 2016-02-10 MED ORDER — OXYCODONE-ACETAMINOPHEN 10-325 MG PO TABS: 1.0000 | ORAL_TABLET | Freq: Four times a day (QID) | ORAL | Status: DC | PRN

## 2016-02-10 MED ORDER — VITAMIN B-12 1000 MCG PO TABS
1000.0000 ug | ORAL_TABLET | Freq: Every day | ORAL | Status: DC
  Administered 2016-02-10 – 2016-02-11 (×2): 1000 ug via ORAL
  Filled 2016-02-10 (×2): qty 1

## 2016-02-10 MED ORDER — L-METHYLFOLATE-B6-B12 3-35-2 MG PO TABS
1.0000 | ORAL_TABLET | Freq: Every day | ORAL | Status: DC
  Administered 2016-02-11: 1 via ORAL
  Filled 2016-02-10 (×2): qty 1

## 2016-02-10 MED ORDER — ACETAMINOPHEN 325 MG PO TABS: 650.0000 mg | ORAL_TABLET | Freq: Four times a day (QID) | ORAL | Status: DC | PRN

## 2016-02-10 MED ORDER — PANTOPRAZOLE SODIUM 40 MG PO TBEC
40.0000 mg | DELAYED_RELEASE_TABLET | Freq: Every day | ORAL | Status: DC
  Administered 2016-02-10 – 2016-02-11 (×2): 40 mg via ORAL
  Filled 2016-02-10 (×2): qty 1

## 2016-02-10 MED ORDER — DEXTROSE 50 % IV SOLN: 50.0000 mL | INTRAVENOUS | Status: DC | PRN

## 2016-02-10 MED ORDER — DEXTROSE 50 % IV SOLN
INTRAVENOUS | Status: AC
  Administered 2016-02-10: 50 mL via INTRAVENOUS
  Filled 2016-02-10: qty 50

## 2016-02-10 MED ORDER — ACETAMINOPHEN 650 MG RE SUPP: 650.0000 mg | Freq: Four times a day (QID) | RECTAL | Status: DC | PRN

## 2016-02-10 MED ORDER — BUPROPION HCL ER (XL) 150 MG PO TB24: 300.0000 mg | ORAL_TABLET | Freq: Every day | ORAL | Status: DC

## 2016-02-10 MED ORDER — SODIUM CHLORIDE 0.9 % IV BOLUS (SEPSIS)
500.0000 mL | Freq: Once | INTRAVENOUS | Status: AC
  Administered 2016-02-10: 500 mL via INTRAVENOUS

## 2016-02-10 MED ORDER — NORTRIPTYLINE HCL 25 MG PO CAPS: 50.0000 mg | ORAL_CAPSULE | Freq: Every day | ORAL | Status: DC

## 2016-02-10 MED ORDER — ONDANSETRON HCL 4 MG PO TABS: 4.0000 mg | ORAL_TABLET | Freq: Four times a day (QID) | ORAL | Status: DC | PRN

## 2016-02-10 MED ORDER — OXCARBAZEPINE 150 MG PO TABS
150.0000 mg | ORAL_TABLET | Freq: Two times a day (BID) | ORAL | Status: DC
  Filled 2016-02-10: qty 1

## 2016-02-10 MED ORDER — VITAMIN D 1000 UNITS PO TABS
1000.0000 [IU] | ORAL_TABLET | Freq: Every day | ORAL | Status: DC
  Administered 2016-02-10 – 2016-02-11 (×2): 1000 [IU] via ORAL
  Filled 2016-02-10 (×3): qty 1

## 2016-02-10 MED ORDER — SODIUM CHLORIDE 0.9 % IV SOLN
INTRAVENOUS | Status: DC
  Administered 2016-02-10 – 2016-02-11 (×3): via INTRAVENOUS

## 2016-02-10 MED ORDER — OMEGA-3-ACID ETHYL ESTERS 1 G PO CAPS
1.0000 g | ORAL_CAPSULE | Freq: Every day | ORAL | Status: DC
  Administered 2016-02-10 – 2016-02-11 (×2): 1 g via ORAL
  Filled 2016-02-10 (×2): qty 1

## 2016-02-10 MED ORDER — SODIUM CHLORIDE 0.9% FLUSH
3.0000 mL | Freq: Two times a day (BID) | INTRAVENOUS | Status: DC
  Administered 2016-02-11: 3 mL via INTRAVENOUS

## 2016-02-10 MED ORDER — ONDANSETRON HCL 4 MG/2ML IJ SOLN: 4.0000 mg | Freq: Four times a day (QID) | INTRAMUSCULAR | Status: DC | PRN

## 2016-02-10 MED ORDER — FINASTERIDE 5 MG PO TABS
5.0000 mg | ORAL_TABLET | Freq: Every day | ORAL | Status: DC
  Administered 2016-02-10 – 2016-02-11 (×2): 5 mg via ORAL
  Filled 2016-02-10 (×2): qty 1

## 2016-02-10 MED ORDER — OXYCODONE HCL 5 MG PO TABS: 5.0000 mg | ORAL_TABLET | Freq: Four times a day (QID) | ORAL | Status: DC | PRN

## 2016-02-10 MED ORDER — DEXTROSE 50 % IV SOLN
50.0000 mL | Freq: Once | INTRAVENOUS | Status: AC
  Administered 2016-02-10: 50 mL via INTRAVENOUS

## 2016-02-10 MED ORDER — TEMAZEPAM 15 MG PO CAPS
15.0000 mg | ORAL_CAPSULE | Freq: Every day | ORAL | Status: DC
  Administered 2016-02-10: 15 mg via ORAL
  Filled 2016-02-10: qty 1

## 2016-02-10 MED ORDER — LAMOTRIGINE 100 MG PO TABS
100.0000 mg | ORAL_TABLET | Freq: Every day | ORAL | Status: DC
  Administered 2016-02-10 – 2016-02-11 (×2): 100 mg via ORAL
  Filled 2016-02-10 (×2): qty 1

## 2016-02-10 MED ORDER — QUETIAPINE FUMARATE 200 MG PO TABS
600.0000 mg | ORAL_TABLET | Freq: Every day | ORAL | Status: DC
  Administered 2016-02-10: 600 mg via ORAL
  Filled 2016-02-10 (×2): qty 3

## 2016-02-10 NOTE — Progress Notes (Signed)
Interval   Patient admitted for ataxia, confusion and fall.  Neurology was consulted which recommended encephalopathy work up  Patient stable, back to baseline mentally.  EEG was ordered  BMP and CBC in AM  Follow up lab work up   For full details of analysis and plan see H&P  Patient admitted after midnight   Chipper Oman, MD  [facility]

## 2016-02-10 NOTE — Progress Notes (Signed)
Patient arrived to unit 5co7 from [facility]. Pt is oriented to self place and time, but not to age. Son and daughter at bedside provided health history and went home. Remnded pt to always call for assistance before getting out of bed, call bell within reach.

## 2016-02-10 NOTE — Progress Notes (Signed)
EEG completed, results pending. 

## 2016-02-10 NOTE — Evaluation (Signed)
Physical Therapy Evaluation Patient Details Name: Dustin [name] MRN: [id number] DOB: 1940-09-22 Today's Date: 02/10/2016   History of Present Illness  75 y.o. 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  Clinical Impression  Pt admitted with above diagnosis. Pt currently with functional limitations due to the deficits listed below (see PT Problem List). On eval, pt required min assist bed mobility, min guard assist transfers, and min guard assist gait with RW 40 feet. Pt will benefit from skilled PT to increase their independence and safety with mobility to allow discharge to the venue listed below.       Follow Up Recommendations Home health PT;Supervision/Assistance - 24 hour    Equipment Recommendations  3in1 (PT)    Recommendations for Other Services       Precautions / Restrictions Precautions Precautions: Fall      Mobility  Bed Mobility   Bed Mobility: Supine to Sit     Supine to sit: HOB elevated;Min assist Sit to supine: Min assist;HOB elevated   General bed mobility comments: Pt unable to use LUE for supine<>sit due to pain.  Transfers Overall transfer level: Needs assistance Equipment used: Rolling walker (2 wheeled) Transfers: Sit to/from [facility] Sit to Stand: Min guard Stand pivot transfers: Min guard       General transfer comment: Pt impulsive with sit to stand.  Ambulation/Gait Ambulation/Gait assistance: Min guard Ambulation Distance (Feet): 40 Feet Assistive device: Rolling walker (2 wheeled) Gait Pattern/deviations: Step-through pattern;Decreased stride length;Trunk flexed Gait velocity: decreased Gait velocity interpretation: Below normal speed for age/gender General Gait Details: Verbal cues for safety. Pt steady with gait in  room.  Stairs            Wheelchair Mobility    Modified Rankin (Stroke Patients Only)       Balance Overall balance assessment: Needs assistance Sitting-balance support: No upper extremity supported;Feet supported Sitting balance-Leahy Scale: Good     Standing balance support: Bilateral upper extremity supported;During functional activity Standing balance-Leahy Scale: Fair Standing balance comment: RW needed for ambulation.                             Pertinent Vitals/Pain Pain Assessment: No/denies pain    Home Living Family/patient expects to be discharged to:: Private residence Living Arrangements: Spouse/significant other;Children Available Help at Discharge: Family;Available 24 hours/day Type of Home: House Home Access: Ramped entrance     Home Layout: Two level;Able to live on main level with bedroom/bathroom Home Equipment: Wheelchair - manual;Grab bars - tub/shower;Hand held shower head;Shower seat;Walker - 2 wheels Additional Comments: Pt's daughter in law moved in to assist with care of pt and his wife.    Prior Function Level of Independence: Independent with assistive device(s)               Hand Dominance   Dominant Hand: Left    Extremity/Trunk Assessment   Upper Extremity Assessment Upper Extremity Assessment: LUE deficits/detail LUE Deficits / Details: Painful L shoulder due to bruising from fall. Unable to tolerate ROM or palpation. LUE: Unable to fully assess due to pain    Lower Extremity Assessment Lower Extremity Assessment: Generalized weakness    Cervical / Trunk Assessment Cervical / Trunk Assessment: Kyphotic  Communication   Communication: Expressive difficulties  Cognition Arousal/Alertness:  Awake/alert Behavior During Therapy: WFL for tasks assessed/performed Overall Cognitive Status: Impaired/Different from baseline Area of Impairment: Memory;Attention;Safety/judgement;Problem solving   Current  Attention Level: Selective Memory: Decreased short-term memory   Safety/Judgement: Decreased awareness of safety   Problem Solving: Slow processing;Difficulty sequencing;Requires verbal cues      General Comments      Exercises     Assessment/Plan    PT Assessment Patient needs continued PT services  PT Problem List Decreased strength;Decreased activity tolerance;Decreased balance;Decreased knowledge of use of DME;Decreased coordination;Decreased mobility;Decreased safety awareness;Decreased cognition;Decreased knowledge of precautions          PT Treatment Interventions DME instruction;Gait training;Stair training;Functional mobility training;Balance training;Therapeutic exercise;Therapeutic activities;Patient/family education    PT Goals (Current goals can be found in the Care Plan section)  Acute Rehab PT Goals Patient Stated Goal: home PT Goal Formulation: With patient/family Time For Goal Achievement: 02/24/16 Potential to Achieve Goals: Fair    Frequency Min 3X/week   Barriers to discharge        Co-evaluation               End of Session Equipment Utilized During Treatment: Gait belt Activity Tolerance: Patient tolerated treatment well Patient left: in bed;with bed alarm set;with call bell/phone within reach;with family/visitor present Nurse Communication: Mobility status    Functional Assessment Tool Used: clinical judgement Functional Limitation: Mobility: Walking and moving around Mobility: Walking and Moving Around Current Status [phone]): At least 20 percent but less than 40 percent impaired, limited or restricted Mobility: Walking and Moving Around Goal Status [phone]): At least 1 percent but less than 20 percent impaired, limited or restricted    Time: 1348-1413 PT Time Calculation (min) (ACUTE ONLY): 25 min   Charges:   PT Evaluation $PT Eval Moderate Complexity: 1 Procedure PT Treatments $Gait Training: 8-22 mins   PT G Codes:   PT  G-Codes **NOT FOR INPATIENT CLASS** Functional Assessment Tool Used: clinical judgement Functional Limitation: Mobility: Walking and moving around Mobility: Walking and Moving Around Current Status [id number]): At least 20 percent but less than 40 percent impaired, limited or restricted Mobility: Walking and Moving Around Goal Status [phone]): At least 1 percent but less than 20 percent impaired, limited or restricted    Lorriane Shire 02/10/2016, 2:36 PM

## 2016-02-10 NOTE — Consult Note (Addendum)
Admission H&P    Chief Complaint: Confusion and unsteady gait.  HPI: Dustin [name] is an 75 y.o. male with a history of neuropathy, BPH, hyponatremia, CHF and recurrent falls presenting following recurrent fall at home and unsteadiness of gait. Patient was also noted to be confused. He had a similar presentation earlier this month with unremarkable workup, including MRI of the brain, cervical spine and T-spine (01/22/2016). Vitamin B 12 level was high at greater than 2400. Repeat CT scan of his head in the ED showed no acute findings. Serum sodium was 129. Glucose was 109.   Past Medical History:  Diagnosis Date  . BPH (benign prostatic hyperplasia)   . Chronic combined systolic and diastolic congestive heart failure ([place])   . GERD (gastroesophageal reflux disease)    protonix for control  . Insomnia   . Neck pain   . Neuropathy [facility])     Past Surgical History:  Procedure Laterality Date  . ANTERIOR CERVICAL DECOMPRESSION/DISCECTOMY FUSION 4 LEVELS N/A 10/27/2012   Procedure: Cervical Three-Four Cervical Four-Five Cervical Five-Six Cervical Six-Seven  Anterior cervical decompression/diskectomy/fusion;  Surgeon: Erline Levine, MD;  Location: [place] [facility];  Service: Neurosurgery;  Laterality: N/A;  Cervical Three-Four Cervical Four-Five Cervical Five-Six Cervical Six-Seven  Anterior cervical decompression/diskectomy/fusion  . SHOULDER ARTHROSCOPY  2010   rt shoulder  . SHOULDER ARTHROSCOPY  02/15/2011   Procedure: ARTHROSCOPY SHOULDER;  Surgeon: Cynda Familia;  Location: [place];  Service: Orthopedics;  Laterality: Right;  Debridement  . TONSILLECTOMY      Family History  Problem Relation Age of Onset  . Emphysema Mother   . Cirrhosis Father   . Stroke Brother    Social History:  reports that he quit smoking about 26 years ago. He smoked 1.50 packs per day. He has never used smokeless tobacco. He reports that he does not drink alcohol or use  drugs.  Allergies: No Known Allergies  Medications Prior to Admission  Medication Sig Dispense Refill  . buPROPion (WELLBUTRIN XL) 300 MG 24 hr tablet Take 300 mg by mouth daily.    . cholecalciferol (VITAMIN D) 1000 UNITS tablet Take 1,000 Units by mouth daily.     . finasteride (PROSCAR) 5 MG tablet Take 5 mg by mouth daily.      Marland Kitchen L-Methylfolate-Algae-B12-B6 (METANX) 3-90.314-2-35 MG CAPS Take one capsule daily. 90 capsule 3  . lamoTRIgine (LAMICTAL) 100 MG tablet Take 100 mg by mouth daily.    . nortriptyline (PAMELOR) 25 MG capsule Take 2 capsules (50 mg total) by mouth at bedtime. 60 capsule 6  . Omega-3 Fatty Acids (FISH OIL PO) Take 1 tablet by mouth daily.    . OXcarbazepine (TRILEPTAL) 150 MG tablet Take 1 tablet (150 mg total) by mouth 2 (two) times daily. 60 tablet 6  . oxyCODONE-acetaminophen (PERCOCET) 10-325 MG tablet Take 1 tablet by mouth every 6 (six) hours as needed for pain. (Patient taking differently: Take 1 tablet by mouth 3 (three) times daily. ) 30 tablet 0  . OXYCONTIN 20 MG T12A 12 hr tablet TAKE 1 TABLET BY ORAL ROUTE EVERY 12 HOURS (DNF 09/24/14)  0  . pantoprazole (PROTONIX) 40 MG tablet Take 40 mg by mouth daily.      . polyethylene glycol (MIRALAX / GLYCOLAX) packet Take 17 g by mouth daily as needed for mild constipation. 30 each 0  . QUEtiapine (SEROQUEL) 300 MG tablet Take 600 mg by mouth at bedtime. Reported on 05/10/2015    . temazepam (RESTORIL)  15 MG capsule Take 15 mg by mouth at bedtime.    . vitamin B-12 (CYANOCOBALAMIN) 1000 MCG tablet Take 1,000 mcg by mouth daily.    Marland Kitchen rOPINIRole (REQUIP) 1 MG tablet Take 1 tablet (1 mg total) by mouth 3 (three) times daily. (Patient not taking: Reported on 02/09/2016) 90 tablet 0    ROS: Unavailable due to patient's level of confusion.  Physical Examination: Blood pressure 128/88, pulse 84, temperature 97.6 F (36.4 C), temperature source Oral, resp. rate 25, SpO2 97 %.  HEENT-  Normocephalic, no lesions,  without obvious abnormality.  Normal external eye and conjunctiva.  Normal TM's bilaterally.  Normal auditory canals and external ears. Normal external nose, mucus membranes and septum.  Normal pharynx. Neck supple with no masses, nodes, nodules or enlargement. Cardiovascular - regular rate and rhythm, S1, S2 normal, no murmur, click, rub or gallop Lungs - chest clear, no wheezing, rales, normal symmetric air entry Abdomen - soft, non-tender; bowel sounds normal; no masses,  no organomegaly Extremities - no joint deformities, effusion, or inflammation  Neurologic Examination: Mental Status: Alert, to time and place but tended to perseverate rather freely, no acute distress.  Speech fluent without evidence of aphasia. Able to follow commands without difficulty. Cranial Nerves: II-Visual fields were normal. III/IV/VI-Pupils were equal and reacted normally to light. Extraocular movements were full and conjugate.  V/VII-no facial numbness and no facial weakness. VIII-normal. X-normal speech and symmetrical palatal movement. XI: trapezius strength/neck flexion strength normal bilaterally XII-midline tongue extension with normal strength. Motor: Mild to moderate weakness of dorsiflexors of the feet; normal strength of plantar flexors; motor exam otherwise unremarkable except for asterixis of upper extremities bilaterally. Sensory: Normal throughout. Deep Tendon Reflexes: 2+ and symmetric. Plantars: Flexor bilaterally Cerebellar: Normal finger-to-nose testing except for mild tremor bilaterally. Carotid auscultation: Normal  Results for orders placed or performed during the hospital encounter of 02/09/16 (from the past 48 hour(s))  Brain natriuretic peptide     Status: None   Collection Time: 02/09/16  9:57 PM  Result Value Ref Range   B Natriuretic Peptide 17.3 0.0 - 100.0 pg/mL  Basic metabolic panel     Status: Abnormal   Collection Time: 02/09/16  9:58 PM  Result Value Ref Range   Sodium  129 (L) 135 - 145 mmol/L   Potassium 3.5 3.5 - 5.1 mmol/L   Chloride 96 (L) 101 - 111 mmol/L   CO2 26 22 - 32 mmol/L   Glucose, Bld 101 (H) 65 - 99 mg/dL   BUN 14 6 - 20 mg/dL   Creatinine, Ser 0.80 0.61 - 1.24 mg/dL   Calcium 8.3 (L) 8.9 - 10.3 mg/dL   GFR calc non Af Amer >60 >60 mL/min   GFR calc Af Amer >60 >60 mL/min    Comment: (NOTE) The eGFR has been calculated using the CKD EPI equation. This calculation has not been validated in all clinical situations. eGFR's persistently <60 mL/min signify possible Chronic Kidney Disease.    Anion gap 7 5 - 15  CBC     Status: Abnormal   Collection Time: 02/09/16  9:58 PM  Result Value Ref Range   WBC 6.0 4.0 - 10.5 K/uL   RBC 2.90 (L) 4.22 - 5.81 MIL/uL   Hemoglobin 9.4 (L) 13.0 - 17.0 g/dL   HCT 26.6 (L) 39.0 - 52.0 %   MCV 91.7 78.0 - 100.0 fL   MCH 32.4 26.0 - 34.0 pg   MCHC 35.3 30.0 - 36.0 g/dL  RDW 13.5 11.5 - 15.5 %   Platelets 156 150 - 400 K/uL  CBG monitoring, ED     Status: None   Collection Time: 02/09/16 11:37 PM  Result Value Ref Range   Glucose-Capillary 82 65 - 99 mg/dL  Urinalysis, Routine w reflex microscopic     Status: Abnormal   Collection Time: 02/10/16  2:08 AM  Result Value Ref Range   Color, Urine YELLOW YELLOW   APPearance CLEAR CLEAR   Specific Gravity, Urine 1.014 1.005 - 1.030   pH 6.0 5.0 - 8.0   Glucose, UA 50 (A) NEGATIVE mg/dL   Hgb urine dipstick NEGATIVE NEGATIVE   Bilirubin Urine NEGATIVE NEGATIVE   Ketones, ur NEGATIVE NEGATIVE mg/dL   Protein, ur NEGATIVE NEGATIVE mg/dL   Nitrite NEGATIVE NEGATIVE   Leukocytes, UA NEGATIVE NEGATIVE  CBG monitoring, ED     Status: None   Collection Time: 02/10/16  4:13 AM  Result Value Ref Range   Glucose-Capillary 69 65 - 99 mg/dL  Glucose, capillary     Status: Abnormal   Collection Time: 02/10/16  6:12 AM  Result Value Ref Range   Glucose-Capillary 109 (H) 65 - 99 mg/dL   Comment 1 Notify RN    Comment 2 Document in Chart    Dg Elbow  Complete Left  Result Date: 02/09/2016 CLINICAL DATA:  Pain following fall EXAM: LEFT ELBOW - COMPLETE 3+ VIEW COMPARISON:  None. FINDINGS: Frontal, lateral, and bilateral oblique views were obtained. There is no demonstrable acute fracture or dislocation. There is soft tissue swelling. No joint effusion. There is calcification along the lateral distal humeral condyle, likely due to calcific tendinosis. There is calcification more laterally in the soft tissues, likely residua of prior trauma. There is no appreciable joint space narrowing or erosion. IMPRESSION: No acute fracture or dislocation. 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 Calcifications more laterally in the soft tissues at this level likely is residua of old trauma. There is soft tissue swelling. Electronically Signed   By: Lowella Grip III M.D.   On: 02/09/2016 13:58   Ct Head Wo Contrast  Result Date: 02/09/2016 CLINICAL DATA:  Progressive weakness and gait instability. Multiple recent falls. Unwitnessed fall approximately 8 hours ago. EXAM: CT HEAD WITHOUT CONTRAST TECHNIQUE: Contiguous axial images were obtained from the base of the skull through the vertex without intravenous contrast. COMPARISON:  02/06/2016, 01/22/2016 FINDINGS: Brain: There is no intracranial hemorrhage, mass or evidence of acute infarction. There is mild generalized atrophy. There is mild chronic microvascular ischemic change. There is no significant extra-axial fluid collection. No acute intracranial findings are evident. Vascular: No hyperdense vessel or unexpected calcification. Skull: Normal. Negative for fracture or focal lesion. Sinuses/Orbits: No acute finding. Other: None. IMPRESSION: No acute intracranial findings. There is mild generalized atrophy and chronic appearing white matter hypodensities which likely represent small vessel ischemic disease. Electronically Signed   By: Andreas Newport M.D.   On: 02/09/2016 23:45   Dg Shoulder Left  Result Date: 02/09/2016 CLINICAL DATA:  Pain following fall EXAM: LEFT SHOULDER - 2+ VIEW COMPARISON:  Jun 19, 2007 FINDINGS: Frontal, Y scapular, and axillary images were obtained. There is no acute fracture or dislocation. 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 No erosive change. Visualized left lung is clear. There is postoperative change in the lower cervical spine. IMPRESSION: Mild generalized osteoarthritic change. No acute fracture or dislocation. Electronically Signed   By: Gwyndolyn Saxon  Jasmine December III M.D.   On: 02/09/2016 13:59    Assessment/Plan 75 year old man with confusion and recurrent falls with unsteady gait. Etiology for encephalopathic state is unclear likely in part metabolic. Underlying dementia cannot be ruled out at this point. Patient has a history of neuropathy and has mild distal lower extremity weakness. Etiology is unclear. MRI has shown no significant abnormality with MRI of the brain, C-spine and T-spine.  Recommendations: 1. EEG to assess severity of encephalopathy 2. TSH and RPR 3. Repeat comprehensive metabolic panel 4. Physical therapy consult 5. Case management consult for probable need for placement in a long-term care facility  C.R. Nicole Kindred, MD [facility] [phone]  02/10/2016, 6:43 AM

## 2016-02-10 NOTE — Procedures (Signed)
History: 75 year old male with encephalopathy  Sedation: None  Technique: This is a 21 channel routine scalp EEG performed at the bedside with bipolar and monopolar montages arranged in accordance to the international 10/20 system of electrode placement. One channel was dedicated to EKG recording.    Background: The background consists of intermixed alpha and beta activities. There is a well defined posterior dominant rhythm of 9 Hz that attenuates with eye opening. There is an increase in delta associated with drowsiness.   Photic stimulation: Physiologic driving is now performed  EEG Abnormalities: None  Clinical Interpretation: This normal EEG is recorded in the waking and drowsy state. There was no seizure or seizure predisposition recorded on this study. Please note that a normal EEG does not preclude the possibility of epilepsy.   Roland Rack, MD [facility] [phone]  If 7pm- 7am, please page neurology on call as listed in [place].

## 2016-02-10 NOTE — ED Notes (Signed)
Per Dr. Blaine Hamper , pt. Should be seen by Neurologist  here in ED prior to transferring to the floor. Pt. Might be transferred to [facility] if Neurologist desired.

## 2016-02-10 NOTE — H&P (Signed)
History and Physical    Dustin [name] [id number] DOB: 08-Dec-1940 DOA: 02/09/2016  Referring MD/NP/PA:   PCP: Lujean Amel, MD   Patient coming from:  The patient is coming from home.  At baseline, pt is independent for most of ADL.   Chief Complaint: Ataxia, fall and confusion  HPI: Dustin [name] is a 75 y.o. 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  Per family, pt has been having poor balance and frequent fall recently. He had left rib fracture and multiple bruises from fall. Patient was seen in ED multiple times. He had negative MRI of brain and nonimpressive MRA of head on 01/22/16. MRI of C-spin and T spin on 01/22/16 showed no acute osseous abnormality or abnormal enhancement of the cervical or thoracic spine. He continues to have ataxia and fall. He has confusion intermittently. Per family, patient does not seem to have chest pain, shortness breath, cough, nausea, vomiting, diarrhea, abdominal pain, symptoms of UTI. He does not have vision change or hearing loss. He has tenderness over left chest wall due to rib fracture.  ED Course: pt was found to have negative CT head, negative x-ray of her left elbow and left shoulder, negative urinalysis, WBC 6.0, sodium 129, creatinine 0.80, temperature normal, oxygen saturation 95% on room air. Nicole Kindred was consulted by EDP. Pt is placed on tele bed for obs. Neurology, Dr.  Nicole Kindred was consulted by EDP.  Review of Systems:   General: no fevers, chills, no changes in body weight, has fatigue HEENT: no blurry vision, hearing changes or sore throat Respiratory: no dyspnea, coughing, wheezing CV: no chest pain, no palpitations GI: no nausea, vomiting, abdominal pain, diarrhea, constipation GU: no dysuria, burning on urination, increased urinary frequency,  hematuria  Ext: no leg edema Neuro: has confusion and ataxia, no unilateral weakness, numbness, or tingling, no vision change or hearing loss. Has fall.  Skin: has multiple bruises. MSK: No muscle spasm, no deformity, no limitation of range of movement in spin Heme: No easy bruising.  Travel history: No recent long distant travel.  Allergy: No Known Allergies  Past Medical History:  Diagnosis Date  . BPH (benign prostatic hyperplasia)   . Chronic combined systolic and diastolic congestive heart failure ([place] [place])   . GERD (gastroesophageal reflux disease)    protonix for control  . Insomnia   . Neck pain   . Neuropathy [facility])     Past Surgical History:  Procedure Laterality Date  . ANTERIOR CERVICAL DECOMPRESSION/DISCECTOMY FUSION 4 LEVELS N/A 10/27/2012   Procedure: Cervical Three-Four Cervical Four-Five Cervical Five-Six Cervical Six-Seven  Anterior cervical decompression/diskectomy/fusion;  Surgeon: Erline Levine, MD;  Location: [facility];  Service: Neurosurgery;  Laterality: N/A;  Cervical Three-Four Cervical Four-Five Cervical Five-Six Cervical Six-Seven  Anterior cervical decompression/diskectomy/fusion  . SHOULDER ARTHROSCOPY  2010   rt shoulder  . SHOULDER ARTHROSCOPY  02/15/2011   Procedure: ARTHROSCOPY SHOULDER;  Surgeon: Cynda Familia;  Location: [facility];  Service: Orthopedics;  Laterality: Right;  Debridement  . TONSILLECTOMY      Social History:  reports that he quit smoking about 26 years ago. He smoked 1.50 packs per day. He has never used smokeless tobacco. He reports that he does not drink alcohol or use drugs.  Family History:  Family History  Problem Relation Age of Onset  . Emphysema  Mother   . Cirrhosis Father   . Stroke Brother      Prior to Admission medications   Medication Sig Start Date End Date Taking? Authorizing Provider  buPROPion (WELLBUTRIN XL) 300 MG 24 hr tablet Take 300 mg by mouth daily.   Yes Historical Provider, MD   cholecalciferol (VITAMIN D) 1000 UNITS tablet Take 1,000 Units by mouth daily.    Yes Historical Provider, MD  finasteride (PROSCAR) 5 MG tablet Take 5 mg by mouth daily.     Yes Historical Provider, MD  L-Methylfolate-Algae-B12-B6 Glade Stanford) 3-90.314-2-35 MG CAPS Take one capsule daily. 11/02/15  Yes Marcial Pacas, MD  lamoTRIgine (LAMICTAL) 100 MG tablet Take 100 mg by mouth daily.   Yes Historical Provider, MD  nortriptyline (PAMELOR) 25 MG capsule Take 2 capsules (50 mg total) by mouth at bedtime. 09/25/15  Yes Dennie Bible, NP  Omega-3 Fatty Acids (FISH OIL PO) Take 1 tablet by mouth daily.   Yes Historical Provider, MD  OXcarbazepine (TRILEPTAL) 150 MG tablet Take 1 tablet (150 mg total) by mouth 2 (two) times daily. 09/25/15  Yes Dennie Bible, NP  oxyCODONE-acetaminophen (PERCOCET) 10-325 MG tablet Take 1 tablet by mouth every 6 (six) hours as needed for pain. Patient taking differently: Take 1 tablet by mouth 3 (three) times daily.  05/03/15  Yes Ripudeep K Rai, MD  OXYCONTIN 20 MG T12A 12 hr tablet TAKE 1 TABLET BY ORAL ROUTE EVERY 12 HOURS (DNF 09/24/14) 09/27/14  Yes Historical Provider, MD  pantoprazole (PROTONIX) 40 MG tablet Take 40 mg by mouth daily.     Yes Historical Provider, MD  polyethylene glycol (MIRALAX / GLYCOLAX) packet Take 17 g by mouth daily as needed for mild constipation. 05/03/15  Yes Ripudeep Krystal Eaton, MD  QUEtiapine (SEROQUEL) 300 MG tablet Take 600 mg by mouth at bedtime. Reported on 05/10/2015   Yes Historical Provider, MD  temazepam (RESTORIL) 15 MG capsule Take 15 mg by mouth at bedtime.   Yes Historical Provider, MD  vitamin B-12 (CYANOCOBALAMIN) 1000 MCG tablet Take 1,000 mcg by mouth daily.   Yes Historical Provider, MD  rOPINIRole (REQUIP) 1 MG tablet Take 1 tablet (1 mg total) by mouth 3 (three) times daily. Patient not taking: Reported on 02/09/2016 01/31/16 03/01/16  Gibson Ramp, MD    Physical Exam: Vitals:   02/09/16 2340 02/09/16 2345 02/10/16 0000  02/10/16 0030  BP: 142/78  138/82 128/88  Pulse: 85 85 89 84  Resp: 17 18 19 25   Temp:      TempSrc:      SpO2: 100% 100% 98% 97%   General: Not in acute distress HEENT:       Eyes: PERRL, EOMI, no scleral icterus.       ENT: No discharge from the ears and nose, no pharynx injection, no tonsillar enlargement.        Neck: No JVD, no bruit, no mass felt. Heme: No neck lymph node enlargement. Cardiac: S1/S2, RRR, No murmurs, No gallops or rubs. Respiratory: No rales, wheezing, rhonchi or rubs. GI: Soft, nondistended, nontender, no rebound pain, no organomegaly, BS present. GU: No hematuria Ext: No pitting leg edema bilaterally. 2+DP/PT pulse bilaterally. Musculoskeletal: No joint deformities, No joint redness or warmth, no limitation of ROM in spin. Skin: Has multiple bruises. Neuro: intermittently confused, currently oriented X3, cranial nerves II-XII grossly intact, moves all extremities normally. Muscle strength 5/5 in all extremities, sensation to light touch intact. Brachial reflex 2+ bilaterally. Negative Babinski's sign.  Psych: Patient is not psychotic, no suicidal or hemocidal ideation.  Labs on Admission: I have personally reviewed following labs and imaging studies  CBC:  Recent Labs Lab 02/06/16 1945 02/09/16 1247 02/09/16 2158  WBC 7.2 10.5 6.0  NEUTROABS 5.6 9.0*  --   HGB 10.6* 10.4* 9.4*  HCT 30.8* 30.1* 26.6*  MCV 92.8 91.8 91.7  PLT PLATELET CLUMPS NOTED ON SMEAR, UNABLE TO ESTIMATE 174 [id number]   Basic Metabolic Panel:  Recent Labs Lab 02/06/16 1945 02/09/16 1247 02/09/16 2158  NA 129* 129* 129*  K 3.8 4.3 3.5  CL 94* 94* 96*  CO2 25 26 26   GLUCOSE 95 131* 101*  BUN 12 13 14   CREATININE 1.06 1.16 0.80  CALCIUM 9.5 9.1 8.3*   GFR: Estimated Creatinine Clearance: 94.2 mL/min (by C-G formula based on SCr of 0.8 mg/dL). Liver Function Tests: No results for input(s): AST, ALT, ALKPHOS, BILITOT, PROT, ALBUMIN in the last 168 hours. No results for  input(s): LIPASE, AMYLASE in the last 168 hours. No results for input(s): AMMONIA in the last 168 hours. Coagulation Profile: No results for input(s): INR, PROTIME in the last 168 hours. Cardiac Enzymes: No results for input(s): CKTOTAL, CKMB, CKMBINDEX, TROPONINI in the last 168 hours. BNP (last 3 results) No results for input(s): PROBNP in the last 8760 hours. HbA1C: No results for input(s): HGBA1C in the last 72 hours. CBG:  Recent Labs Lab 02/09/16 2337 02/10/16 0413  GLUCAP 82 69   Lipid Profile: No results for input(s): CHOL, HDL, LDLCALC, TRIG, CHOLHDL, LDLDIRECT in the last 72 hours. Thyroid Function Tests: No results for input(s): TSH, T4TOTAL, FREET4, T3FREE, THYROIDAB in the last 72 hours. Anemia Panel: No results for input(s): VITAMINB12, FOLATE, FERRITIN, TIBC, IRON, RETICCTPCT in the last 72 hours. Urine analysis:    Component Value Date/Time   COLORURINE YELLOW 02/10/2016 0208   APPEARANCEUR CLEAR 02/10/2016 0208   LABSPEC 1.014 02/10/2016 0208   PHURINE 6.0 02/10/2016 0208   GLUCOSEU 50 (A) 02/10/2016 0208   HGBUR NEGATIVE 02/10/2016 0208   BILIRUBINUR NEGATIVE 02/10/2016 0208   KETONESUR NEGATIVE 02/10/2016 0208   PROTEINUR NEGATIVE 02/10/2016 0208   NITRITE NEGATIVE 02/10/2016 0208   LEUKOCYTESUR NEGATIVE 02/10/2016 0208   Sepsis Labs: @LABRCNTIP (procalcitonin:4,lacticidven:4) )No results found for this or any previous visit (from the past 240 hour(s)).   Radiological Exams on Admission: Dg Elbow Complete Left  Result Date: 02/09/2016 CLINICAL DATA:  Pain following fall EXAM: LEFT ELBOW - COMPLETE 3+ VIEW COMPARISON:  None. FINDINGS: Frontal, lateral, and bilateral oblique views were obtained. There is no demonstrable acute fracture or dislocation. There is soft tissue swelling. No joint effusion. There is calcification along the lateral distal humeral condyle, likely due to calcific tendinosis. There is calcification more laterally in the soft  tissues, likely residua of prior trauma. There is no appreciable joint space narrowing or erosion. IMPRESSION: No acute fracture or dislocation. 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 Calcifications more laterally in the soft tissues at this level likely is residua of old trauma. There is soft tissue swelling. Electronically Signed   By: Lowella Grip III M.D.   On: 02/09/2016 13:58   Ct Head Wo Contrast  Result Date: 02/09/2016 CLINICAL DATA:  Progressive weakness and gait instability. Multiple recent falls. Unwitnessed fall approximately 8 hours ago. EXAM: CT HEAD WITHOUT CONTRAST TECHNIQUE: Contiguous axial images were obtained from the base of the skull through the vertex without intravenous  contrast. COMPARISON:  02/06/2016, 01/22/2016 FINDINGS: Brain: There is no intracranial hemorrhage, mass or evidence of acute infarction. There is mild generalized atrophy. There is mild chronic microvascular ischemic change. There is no significant extra-axial fluid collection. No acute intracranial findings are evident. Vascular: No hyperdense vessel or unexpected calcification. Skull: Normal. Negative for fracture or focal lesion. Sinuses/Orbits: No acute finding. Other: None. IMPRESSION: No acute intracranial findings. There is mild generalized atrophy and chronic appearing white matter hypodensities which likely represent small vessel ischemic disease. Electronically Signed   By: Andreas Newport M.D.   On: 02/09/2016 23:45   Dg Shoulder Left  Result Date: 02/09/2016 CLINICAL DATA:  Pain following fall EXAM: LEFT SHOULDER - 2+ VIEW COMPARISON:  Jun 19, 2007 FINDINGS: Frontal, Y scapular, and axillary images were obtained. There is no acute fracture or dislocation. 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 No erosive change. Visualized left lung is clear. There is postoperative change in the lower cervical spine. IMPRESSION: Mild  generalized osteoarthritic change. No acute fracture or dislocation. Electronically Signed   By: Lowella Grip III M.D.   On: 02/09/2016 13:59     EKG: Independently reviewed.  Sinus rhythm, QTC 429, early R-wave progression, low 42 in limb leads.   Assessment/Plan Principal Problem:   Ataxia Active Problems:   Depression   GERD (gastroesophageal reflux disease)   BPH (benign prostatic hyperplasia)   Hyponatremia   Fall   Rib fracture   Acute encephalopathy   Chronic combined systolic and diastolic congestive heart failure (H[facility])    Ataxia and frequent fall: Etiology is not clear. He had negative MRI of brain and nonimpressive MRA of head on 01/22/16. MRI of C-spin and T spin on 01/22/16 showed no acute osseous abnormality or abnormal enhancement of the cervical or thoracic spine. No signs of infection. His multiple psychy medications (polypharmacy) may have contributed partially. Neuro, dr. Nicole Kindred was consulted.   -will place on tele bed for obs -fall precaution -f/u neurology recommendation -PT/OT -hold some of his psych meds as below  Depression:  -will hold Nortriptyline, Trileptal,  Requip, Wellbutrin -only continue Seroquel and lamictal to avoid withdraw  Hyponatremia: Sodium 129. Likely multifactorial etiology. Differential diagnosis includes poor oral intake, side effects from psych medications, SIADH, thyroid dysfunction. He has intermittent confusion per family, currently oriented 3. - Will check urine sodium, urine osmolality, serum osmolality. - check TSH and free t4 - Fluid restriction - IVF: 500 mL NS in ED, will continue with IV normal saline at 100 mL/h - check BMP q6h  Acute encephalopathy: Likely multifactorial, including hyponatremia and unknown process of neurological abnormalities which caused ataxia -treat Hyponatremia as above -Frequent neuro check  BPH:  - Continue Proscar  GERD: -Protonix  Rib fracture: X-ray on 02/06/16 showed  nondisplaced fractures of the left ninth, tenth, eleventh ribs. -prn percocet prn pain -hold Oxycontin   Chronic combined systolic and diastolic congestive heart failure: 2-D echo on 01/24/16 showed EF 40-45 percent with grade 1 diastolic dysfunction. Patient does not have leg edema or JVD. Not taking diuretics at home. CHF is compensated. -check BNP   DVT ppx: SQ Lovenox Code Status: Full code Family Communication: Yes, patient's son and daughter-in-law at bed side Disposition Plan:  Anticipate discharge back to previous home environment Consults called:  neurology, Dr. Nicole Kindred Admission status: Obs / tele    Date of Service 02/10/2016    Ivor Costa [facility] Pager [phone]  If 7PM-7AM, please contact night-coverage [email or web address] Password [email or web address] 02/10/2016, 5:22 AM

## 2016-02-11 ENCOUNTER — Encounter (HOSPITAL_COMMUNITY): Payer: Self-pay

## 2016-02-11 DIAGNOSIS — F329 Major depressive disorder, single episode, unspecified: Secondary | ICD-10-CM | POA: Diagnosis not present

## 2016-02-11 DIAGNOSIS — R270 Ataxia, unspecified: Secondary | ICD-10-CM | POA: Diagnosis not present

## 2016-02-11 DIAGNOSIS — I5042 Chronic combined systolic (congestive) and diastolic (congestive) heart failure: Secondary | ICD-10-CM | POA: Diagnosis not present

## 2016-02-11 DIAGNOSIS — G9340 Encephalopathy, unspecified: Secondary | ICD-10-CM | POA: Diagnosis not present

## 2016-02-11 DIAGNOSIS — R4182 Altered mental status, unspecified: Secondary | ICD-10-CM | POA: Diagnosis not present

## 2016-02-11 LAB — CBC
HCT: 24.4 % — ABNORMAL LOW (ref 39.0–52.0)
Hemoglobin: 8.6 g/dL — ABNORMAL LOW (ref 13.0–17.0)
MCH: 32.5 pg (ref 26.0–34.0)
MCHC: 35.2 g/dL (ref 30.0–36.0)
MCV: 92.1 fL (ref 78.0–100.0)
Platelets: 157 10*3/uL (ref 150–400)
RBC: 2.65 MIL/uL — ABNORMAL LOW (ref 4.22–5.81)
RDW: 13.5 % (ref 11.5–15.5)
WBC: 5.2 10*3/uL (ref 4.0–10.5)

## 2016-02-11 LAB — BASIC METABOLIC PANEL
Anion gap: 8 (ref 5–15)
BUN: 5 mg/dL — ABNORMAL LOW (ref 6–20)
CO2: 24 mmol/L (ref 22–32)
Calcium: 8.4 mg/dL — ABNORMAL LOW (ref 8.9–10.3)
Chloride: 100 mmol/L — ABNORMAL LOW (ref 101–111)
Creatinine, Ser: 0.83 mg/dL (ref 0.61–1.24)
GFR calc Af Amer: >60 mL/min (ref >60)
GFR calc non Af Amer: >60 mL/min (ref >60)
Glucose, Bld: 100 mg/dL — ABNORMAL HIGH (ref 65–99)
Potassium: 3 mmol/L — ABNORMAL LOW (ref 3.5–5.1)
Sodium: 132 mmol/L — ABNORMAL LOW (ref 135–145)

## 2016-02-11 LAB — RPR: RPR Ser Ql: NONREACTIVE

## 2016-02-11 LAB — PROTIME-INR
INR: 1.22
Prothrombin Time: 15.5 seconds — ABNORMAL HIGH (ref 11.4–15.2)

## 2016-02-11 LAB — TSH: TSH: 2.672 u[IU]/mL (ref 0.350–4.500)

## 2016-02-11 LAB — CORTISOL-AM, BLOOD: Cortisol - AM: 4.5 ug/dL — ABNORMAL LOW (ref 6.7–22.6)

## 2016-02-11 LAB — T4, FREE: Free T4: 0.65 ng/dL (ref 0.61–1.12)

## 2016-02-11 MED ORDER — SODIUM CHLORIDE 0.9 % IV SOLN: Freq: Once | INTRAVENOUS | Status: DC

## 2016-02-11 MED ORDER — POTASSIUM CHLORIDE CRYS ER 20 MEQ PO TBCR
40.0000 meq | EXTENDED_RELEASE_TABLET | ORAL | Status: AC
  Administered 2016-02-11 (×2): 40 meq via ORAL
  Filled 2016-02-11 (×2): qty 2

## 2016-02-11 NOTE — Care Management Note (Addendum)
Case Management Note  Patient Details  Name: Dustin [name] MRN: [id number] Date of Birth: Feb 14, 1941  Subjective/Objective:                  confusion. Action/Plan: Discharge planning Expected Discharge Date:                  Expected Discharge Plan:  [place]  In-House Referral:     Discharge planning Services  CM Consult  Post Acute Care Choice:  Home Health Choice offered to:  Patient  DME Arranged:  N/A DME Agency:  NA  HH Arranged:  RN, PT, Nurse's Aide [place] Agency:     Status of Service:  Completed, signed off  If discussed at [place] of Stay Meetings, dates discussed:    Additional Comments: CM confirmed with pt (per previous CM note) pt is active with [facility].  Cm has requested resumption HH orders.  CM has notified [facility] of discharge. No other CM needs were communicated. Dellie Catholic, RN 02/11/2016, 11:56 AM

## 2016-02-11 NOTE — Progress Notes (Signed)
Subjective: No further confusion.   Exam: Vitals:   02/11/16 0515 02/11/16 0811  BP: 137/62 140/64  Pulse: 93 87  Resp: 18 18  Temp: 98 F (36.7 C) 98.3 F (36.8 C)   Gen: In bed, NAD Resp: non-labored breathing, no acute distress Abd: soft, nt  Neuro: MS: awake, alert.  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 I would favor outpatient neurocognitive evaluation(MMSE 01/2015 29/30). He is on multiple sedating/potentially contributing medications. Nortriptiline in particular has anticholinergic properties that may contribute if he has an underlying neurocognitive disorder. He has a rolling walker at home but has not used it much. Now states that he will use it.   Recommendations: 1) Would wean sedating medications. 2) continue to work with PT for safety.  3) No further recommendations at this time, please call with further questions or concerns.   Roland Rack, MD [facility] [phone]  If 7pm- 7am, please page neurology on call as listed in [place].

## 2016-02-11 NOTE — Discharge Summary (Signed)
Physician Discharge Summary  Dustin [name]  [id number]  DOB: [birth year]  DOA: 02/09/2016 PCP: Lujean Amel, MD  Admit date: 02/09/2016 Discharge date: 02/11/2016  Admitted From: Home  Disposition: Home   Recommendations for Outpatient Follow-up:  1. Follow up with PCP in 1-2 weeks 2. Please obtain BMP/CBC in one week 3. Please obtain a neurocognitive evaluation   Home Health: Yes   Equipment/Devices: None  Discharge Condition: Improved  CODE STATUS: FULL  Diet recommendation: Heart Healthy / Carb Modified   Brief/Interim Summary: 75 y/o PMHx of BPH, CHF, Recurrent fall and neuropathy, presented with recurrent fall at home and unsteady gait. Patient was also noted to be confuse. Patient had similar presentation this month where he had extensive work up with negative results. Work up included MRI/MRA, CT C and T spine. Patient was placed on observation and neurology was consulted. Recommendation to get an EEG and dementia work up which was negative.   Discharge Diagnoses/Hospital Course:  AMS secondary to polypharmacy (multiple sedating medications) - TSH normal, negative RPR, EEG non diagnostic.  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 Can resume Oxycodone PRN pain  D/c percocet    Chronic combined systolic and diastolic congestive heart failure: 2-D echo on 01/24/16 showed EF 40-45 percent with grade 1 diastolic dysfunction. Patient does not have leg edema or JVD. Not taking diuretics at home. CHF is compensated.  Discharge  Instructions   Allergies as of 02/11/2016   No Known Allergies     Medication List    STOP taking these medications   buPROPion 300 MG 24 hr tablet Commonly known as:  WELLBUTRIN XL   nortriptyline 25 MG capsule Commonly known as:  PAMELOR   oxyCODONE-acetaminophen 10-325 MG tablet Commonly known as:  PERCOCET   rOPINIRole 1 MG tablet Commonly known as:  REQUIP     TAKE these medications   cholecalciferol 1000 units tablet Commonly known as:  VITAMIN D Take 1,000 Units by mouth daily.   finasteride 5 MG tablet Commonly known as:  PROSCAR Take 5 mg by mouth daily.   FISH OIL PO Take 1 tablet by mouth daily.   lamoTRIgine 100 MG tablet Commonly known as:  LAMICTAL Take 100 mg by mouth daily.   METANX 3-90.314-2-35 MG Caps Take one capsule daily.   OXcarbazepine 150 MG tablet Commonly known as:  TRILEPTAL Take 1 tablet (150 mg total) by mouth 2 (two) times daily.   OXYCONTIN 20 mg 12 hr tablet Generic drug:  oxyCODONE TAKE 1 TABLET BY ORAL ROUTE EVERY 12 HOURS (DNF 09/24/14)   pantoprazole 40 MG tablet Commonly known as:  PROTONIX Take 40 mg by mouth daily.   polyethylene glycol packet Commonly known as:  MIRALAX / GLYCOLAX Take 17 g by mouth daily as needed for mild constipation.   QUEtiapine 300 MG tablet Commonly known as:  SEROQUEL Take 600 mg by mouth at bedtime. Reported on 05/10/2015   temazepam 15 MG capsule Commonly known as:  RESTORIL Take 15 mg by mouth at bedtime.   vitamin B-12 1000 MCG tablet Commonly known as:  CYANOCOBALAMIN Take 1,000 mcg by  mouth daily.      Follow-up Information    KOIRALA,DIBAS, MD. Schedule an appointment as soon as possible for a visit in 1 week(s).   Specialty:  Family Medicine Contact information: [place] [address] [phone]          No Known Allergies  Consultations:  Neurology - Dr Leonel Ramsay    Procedures/Studies: Dg Chest 2 View  Result Date:  01/22/2016 CLINICAL DATA:  Imbalance while walking today. EXAM: CHEST  2 VIEW COMPARISON:  12/17/2012 CXR FINDINGS: The heart size and mediastinal contours are within normal limits. Minimal atelectasis at the left lung base. No pneumonic consolidation, CHF or pneumothorax. Mild eventration of the right hemidiaphragm. The visualized skeletal structures are unremarkable. Low anterior cervical disc fusion is partially imaged. IMPRESSION: No active cardiopulmonary disease. Electronically Signed   By: Ashley Royalty M.D.   On: 01/22/2016 17:13   Dg Ribs Unilateral W/chest Left  Result Date: 02/06/2016 CLINICAL DATA:  75 year old male with a history of inferior left-sided rib pain EXAM: LEFT RIBS AND CHEST - 3+ VIEW COMPARISON:  01/22/2016 FINDINGS: Cardiomediastinal silhouette within normal limits. No confluent airspace disease.  No pleural effusion or pneumothorax. Surgical changes of the cervical region incompletely imaged. Nondisplaced rib fractures of the left ninth, tenth, eleventh ribs IMPRESSION: Nondisplaced fractures of the left ninth, tenth, eleventh ribs. No evidence of acute cardiopulmonary disease. Signed, Dulcy Fanny. Earleen Newport, DO [facility] Electronically Signed   By: Corrie Mckusick D.O.   On: 02/06/2016 20:19   Dg Elbow Complete Left  Result Date: 02/09/2016 CLINICAL DATA:  Pain following fall EXAM: LEFT ELBOW - COMPLETE 3+ VIEW COMPARISON:  None. FINDINGS: Frontal, lateral, and bilateral oblique views were obtained. There is no demonstrable acute fracture or dislocation. There is soft tissue swelling. No joint effusion. There is calcification along the lateral distal humeral condyle, likely due to calcific tendinosis. There is calcification more laterally in the soft tissues, likely residua of prior trauma. There is no appreciable joint space narrowing or erosion. IMPRESSION: No acute fracture or dislocation. Calcification lateral to the distal  humeral condyle on the left likely is due to chronic calcific tendinosis. Calcifications more laterally in the soft tissues at this level likely is residua of old trauma. There is soft tissue swelling. Electronically Signed   By: Lowella Grip III M.D.   On: 02/09/2016 13:58   Ct Head Wo Contrast  Result Date: 02/09/2016 CLINICAL DATA:  Progressive weakness and gait instability. Multiple recent falls. Unwitnessed fall approximately 8 hours ago. EXAM: CT HEAD WITHOUT CONTRAST TECHNIQUE: Contiguous axial images were obtained from the base of the skull through the vertex without intravenous contrast. COMPARISON:  02/06/2016, 01/22/2016 FINDINGS: Brain: There is no intracranial hemorrhage, mass or evidence of acute infarction. There is mild generalized atrophy. There is mild chronic microvascular ischemic change. There is no significant extra-axial fluid collection. No acute intracranial findings are evident. Vascular: No hyperdense vessel or unexpected calcification. Skull: Normal. Negative for fracture or focal lesion. Sinuses/Orbits: No acute finding. Other: None. IMPRESSION: No acute intracranial findings. There is mild generalized atrophy and chronic appearing white matter hypodensities which likely represent small vessel ischemic disease. Electronically Signed   By: Andreas Newport M.D.   On: 02/09/2016 23:45   Ct Head Wo Contrast  Result Date: 02/06/2016 CLINICAL DATA:  75 year old male with fall and trauma to the head. EXAM: CT HEAD WITHOUT CONTRAST TECHNIQUE: Contiguous axial images were obtained from the  base of the skull through the vertex without intravenous contrast. COMPARISON:  Head CT dated 01/30/2016 FINDINGS: Brain: No evidence of acute infarction, hemorrhage, hydrocephalus, extra-axial collection or mass lesion/mass effect. Vascular: No hyperdense vessel or unexpected calcification. Skull: Normal. Negative for fracture or focal lesion. Sinuses/Orbits: No acute finding.  Right cataract  surgery. Other: Right forehead hematoma. IMPRESSION: No acute intracranial pathology. Electronically Signed   By: Anner Crete M.D.   On: 02/06/2016 21:19   Mr Brain Wo Contrast  Result Date: 01/22/2016 CLINICAL DATA:  75 y/o  M; gait problem and slurred speech. EXAM: MRI HEAD WITHOUT CONTRAST MRA HEAD WITHOUT CONTRAST TECHNIQUE: Multiplanar, multiecho pulse sequences of the brain and surrounding structures were obtained without intravenous contrast. Angiographic images of the head were obtained using MRA technique without contrast. COMPARISON:  04/22/2012 MRI of the brain. FINDINGS: MRI HEAD FINDINGS Brain: No acute infarction, hemorrhage, hydrocephalus, extra-axial collection or mass lesion. Mild T2 FLAIR hyperintense signal abnormality in periventricular white matter is compatible with mild chronic microvascular ischemic changes. Mild brain parenchymal volume loss. Vascular: As below. Skull and upper cervical spine: Normal marrow signal. Sinuses/Orbits: Negative. Other: None. MRA HEAD FINDINGS Internal carotid arteries:  Patent. Anterior cerebral arteries:  Patent. Middle cerebral arteries: Patent. Anterior communicating artery: Patent. Posterior communicating arteries:  Patent. Posterior cerebral arteries: Patent. Asymmetry with less left PCA distribution flow related signal relative to the right without appreciable proximal obstructing lesion of uncertain significance, possibly artifactual. Basilar artery:  Patent. Vertebral arteries:  Patent.  Right dominant vertebrobasilar system. No evidence of high-grade stenosis, large vessel occlusion, or aneurysm. IMPRESSION: 1. No acute intracranial abnormality. 2. Mild chronic microvascular ischemic changes and parenchymal volume loss of the brain. 3. Patent circle of Willis without evidence for high-grade stenosis, large vessel occlusion, or aneurysm. 4. Asymmetric less distal left PCA distribution flow related signal relative to the right PCA without  appreciable proximal obstructing lesion of uncertain significance, possibly anatomic variant. Electronically Signed   By: Kristine Garbe M.D.   On: 01/22/2016 20:06   Mr Cervical Spine W Wo Contrast  Result Date: 01/23/2016 CLINICAL DATA:  75 y/o M; gait instability and slurred speech with negative brain MRI. Hyponatremia. EXAM: MRI CERVICAL AND THORACIC SPINE WITHOUT AND WITH CONTRAST TECHNIQUE: Multiplanar and multiecho pulse sequences of the cervical spine, to include the craniocervical junction and cervicothoracic junction, and thoracic spine, were obtained without and with intravenous contrast. CONTRAST:  42mL MULTIHANCE GADOBENATE DIMEGLUMINE 529 MG/ML IV SOLN COMPARISON:  01/11/2013 cervical radiographs. 01/22/2016 chest radiograph. FINDINGS: MRI CERVICAL SPINE FINDINGS Alignment: Normal cervical lordosis. Stable C7-T1 grade 1 anterolisthesis. Vertebrae: Mild degenerative superior endplate edema at the T3 level. No evidence for discitis, acute fracture, or suspicious osseous lesion. Anterior cervical discectomy and fusion of C3 through C7. The vertebral bodies at the levels are partially obscured by susceptibility artifact from the fusion hardware. Cord: Normal signal and morphology.  No abnormal enhancement. Posterior Fossa, vertebral arteries, paraspinal tissues: Negative. Disc levels: C2-3: Left-sided uncovertebral hypertrophy with mild left foraminal narrowing. No significant canal stenosis. C3-4: Discectomy. No significant foraminal narrowing or canal stenosis. C4-5: Discectomy with left-sided uncovertebral hypertrophy. Mild left foraminal narrowing. No significant canal stenosis. C5-6: Discectomy with posterior ossific ridging and bilateral uncovertebral/ facet hypertrophy. Effacement of anterior thecal sac with mild canal stenosis. Mild bilateral foraminal narrowing C6-7: Disc osteophyte complex with left-greater-than-right uncovertebral hypertrophy. No significant canal stenosis. Mild  bilateral foraminal narrowing. C7-T1: Grade 1 anterolisthesis with uncovered disc and bilateral uncovertebral hypertrophy. Mild bilateral foraminal  narrowing. No significant canal stenosis. MRI THORACIC SPINE FINDINGS Segmentation:  Standard. Alignment:  Physiologic. Vertebrae: No fracture, evidence of discitis, or bone lesion. T3 superior endplate mild degenerative edema. No abnormal enhancement of the vertebral bodies. Increased signal within the T1 through T5 vertebral bodies on the sagittal T1 postcontrast sequence is due to incomplete fat suppression. T1/T2 hyperintense focus within the T5 vertebral body measuring 9 mm is compatible with a hemangioma. A similar lesion is present at T9 measuring 20 mm and at T4 measuring 5 mm. Cord: Normal signal and morphology. Paraspinal and other soft tissues: Negative. Disc levels: There is multilevel disc desiccation and mild disc space narrowing. There are small disc protrusions for example at the T9-10 and T10-11 levels. There is no significant canal stenosis or foraminal narrowing IMPRESSION: 1. No acute osseous abnormality or abnormal enhancement of the cervical or thoracic spine. 2. No cord compression, abnormal cord signal, or cord enhancement. 3. Anterior cervical discectomy and fusion from C3 through C7. Stable grade 1 C7-T1 anterolisthesis. Multiple levels of mild foraminal narrowing. No high-grade canal stenosis or foraminal narrowing of the cervical spine. 4. Mild thoracic spondylosis with disc desiccation and tiny protrusions. No significant foraminal narrowing or canal stenosis. Electronically Signed   By: Kristine Garbe M.D.   On: 01/23/2016 21:49   Mr Thoracic Spine W Wo Contrast  Result Date: 01/23/2016 CLINICAL DATA:  75 y/o M; gait instability and slurred speech with negative brain MRI. Hyponatremia. EXAM: MRI CERVICAL AND THORACIC SPINE WITHOUT AND WITH CONTRAST TECHNIQUE: Multiplanar and multiecho pulse sequences of the cervical spine, to  include the craniocervical junction and cervicothoracic junction, and thoracic spine, were obtained without and with intravenous contrast. CONTRAST:  76mL MULTIHANCE GADOBENATE DIMEGLUMINE 529 MG/ML IV SOLN COMPARISON:  01/11/2013 cervical radiographs. 01/22/2016 chest radiograph. FINDINGS: MRI CERVICAL SPINE FINDINGS Alignment: Normal cervical lordosis. Stable C7-T1 grade 1 anterolisthesis. Vertebrae: Mild degenerative superior endplate edema at the T3 level. No evidence for discitis, acute fracture, or suspicious osseous lesion. Anterior cervical discectomy and fusion of C3 through C7. The vertebral bodies at the levels are partially obscured by susceptibility artifact from the fusion hardware. Cord: Normal signal and morphology.  No abnormal enhancement. Posterior Fossa, vertebral arteries, paraspinal tissues: Negative. Disc levels: C2-3: Left-sided uncovertebral hypertrophy with mild left foraminal narrowing. No significant canal stenosis. C3-4: Discectomy. No significant foraminal narrowing or canal stenosis. C4-5: Discectomy with left-sided uncovertebral hypertrophy. Mild left foraminal narrowing. No significant canal stenosis. C5-6: Discectomy with posterior ossific ridging and bilateral uncovertebral/ facet hypertrophy. Effacement of anterior thecal sac with mild canal stenosis. Mild bilateral foraminal narrowing C6-7: Disc osteophyte complex with left-greater-than-right uncovertebral hypertrophy. No significant canal stenosis. Mild bilateral foraminal narrowing. C7-T1: Grade 1 anterolisthesis with uncovered disc and bilateral uncovertebral hypertrophy. Mild bilateral foraminal narrowing. No significant canal stenosis. MRI THORACIC SPINE FINDINGS Segmentation:  Standard. Alignment:  Physiologic. Vertebrae: No fracture, evidence of discitis, or bone lesion. T3 superior endplate mild degenerative edema. No abnormal enhancement of the vertebral bodies. Increased signal within the T1 through T5 vertebral bodies  on the sagittal T1 postcontrast sequence is due to incomplete fat suppression. T1/T2 hyperintense focus within the T5 vertebral body measuring 9 mm is compatible with a hemangioma. A similar lesion is present at T9 measuring 20 mm and at T4 measuring 5 mm. Cord: Normal signal and morphology. Paraspinal and other soft tissues: Negative. Disc levels: There is multilevel disc desiccation and mild disc space narrowing. There are small disc protrusions for example at the T9-10 and  T10-11 levels. There is no significant canal stenosis or foraminal narrowing IMPRESSION: 1. No acute osseous abnormality or abnormal enhancement of the cervical or thoracic spine. 2. No cord compression, abnormal cord signal, or cord enhancement. 3. Anterior cervical discectomy and fusion from C3 through C7. Stable grade 1 C7-T1 anterolisthesis. Multiple levels of mild foraminal narrowing. No high-grade canal stenosis or foraminal narrowing of the cervical spine. 4. Mild thoracic spondylosis with disc desiccation and tiny protrusions. No significant foraminal narrowing or canal stenosis. Electronically Signed   By: Kristine Garbe M.D.   On: 01/23/2016 21:49   Dg Shoulder Left  Result Date: 02/09/2016 CLINICAL DATA:  Pain following fall EXAM: LEFT SHOULDER - 2+ VIEW COMPARISON:  Jun 19, 2007 FINDINGS: Frontal, Y scapular, and axillary images were obtained. There is no acute fracture or dislocation. 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 No erosive change. Visualized left lung is clear. There is postoperative change in the lower cervical spine. IMPRESSION: Mild generalized osteoarthritic change. No acute fracture or dislocation. Electronically Signed   By: Lowella Grip III M.D.   On: 02/09/2016 13:59   Mr Jodene Nam Head/brain [id number] Cm  Result Date: 01/22/2016 CLINICAL DATA:  75 y/o  M; gait problem and slurred speech. EXAM: MRI HEAD WITHOUT CONTRAST MRA HEAD WITHOUT CONTRAST TECHNIQUE: Multiplanar, multiecho pulse  sequences of the brain and surrounding structures were obtained without intravenous contrast. Angiographic images of the head were obtained using MRA technique without contrast. COMPARISON:  04/22/2012 MRI of the brain. FINDINGS: MRI HEAD FINDINGS Brain: No acute infarction, hemorrhage, hydrocephalus, extra-axial collection or mass lesion. Mild T2 FLAIR hyperintense signal abnormality in periventricular white matter is compatible with mild chronic microvascular ischemic changes. Mild brain parenchymal volume loss. Vascular: As below. Skull and upper cervical spine: Normal marrow signal. Sinuses/Orbits: Negative. Other: None. MRA HEAD FINDINGS Internal carotid arteries:  Patent. Anterior cerebral arteries:  Patent. Middle cerebral arteries: Patent. Anterior communicating artery: Patent. Posterior communicating arteries:  Patent. Posterior cerebral arteries: Patent. Asymmetry with less left PCA distribution flow related signal relative to the right without appreciable proximal obstructing lesion of uncertain significance, possibly artifactual. Basilar artery:  Patent. Vertebral arteries:  Patent.  Right dominant vertebrobasilar system. No evidence of high-grade stenosis, large vessel occlusion, or aneurysm. IMPRESSION: 1. No acute intracranial abnormality. 2. Mild chronic microvascular ischemic changes and parenchymal volume loss of the brain. 3. Patent circle of Willis without evidence for high-grade stenosis, large vessel occlusion, or aneurysm. 4. Asymmetric less distal left PCA distribution flow related signal relative to the right PCA without appreciable proximal obstructing lesion of uncertain significance, possibly anatomic variant. Electronically Signed   By: Kristine Garbe M.D.   On: 01/22/2016 20:06   Ct Head Code Stroke W/o Cm  Addendum Date: 01/30/2016   ADDENDUM REPORT: 01/30/2016 19:55 ADDENDUM: Critical Value/emergent results were called by telephone at the time of interpretation on  01/30/2016 at 7:51 pm to Dr. Cheral Marker, who verbally acknowledged these results. Electronically Signed   By: Jeannine Boga M.D.   On: 01/30/2016 19:55   Result Date: 01/30/2016 CLINICAL DATA:  Code stroke. Initial evaluation for acute right-sided weakness with slurred speech. EXAM: CT HEAD WITHOUT CONTRAST TECHNIQUE: Contiguous axial images were obtained from the base of the skull through the vertex without intravenous contrast. COMPARISON:  Prior study from 01/22/2016. FINDINGS: Brain: Mild no acute intracranial hemorrhage. No evidence for acute large vessel territory infarct. No mass lesion, midline shift, or mass effect. No hydrocephalus. No extra-axial fluid collection. Vascular: No hyperdense vessel  identified. Skull: Scalp soft tissues within normal limits.  Calvarium intact. Sinuses/Orbits: Globes and orbital soft tissues within normal limits. Patient is status post lens extraction on the right. Paranasal sinuses and mastoid air cells are clear. ASPECTS [facility] Stroke Program Early CT Score) - Ganglionic level infarction (caudate, lentiform nuclei, internal capsule, insula, M1-M3 cortex): 7 - Supraganglionic infarction (M4-M6 cortex): 3 Total score (0-10 with 10 being normal): 10 IMPRESSION: 1. No acute intracranial infarct or other process identified. 2. ASPECTS is 10 3. 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 The stroke neurologist has been paged at the time of this dictation. Results will be communicated as soon as possible. An addendum will be made at that time. Electronically Signed: By: Jeannine Boga M.D. On: 01/30/2016 19:46   (Echo, Carotid, EGD, Colonoscopy, ERCP)    Discharge Exam: Vitals:   02/11/16 0515 02/11/16 0811  BP: 137/62 140/64  Pulse: 93 87  Resp: 18 18  Temp: 98 F (36.7 C) 98.3 F (36.8 C)   Vitals:   02/10/16 2208 02/11/16 0038 02/11/16 0515 02/11/16 0811  BP: (!) 134/57 (!) 116/57 137/62 140/64  Pulse: 87 95 93 87   Resp: 18 18 18 18   Temp: 98.6 F (37 C) 98.6 F (37 C) 98 F (36.7 C) 98.3 F (36.8 C)  TempSrc: Oral Oral Oral Oral  SpO2: 98% 99% 99% 97%    General: Pt is alert, awake, not in acute distress Cardiovascular: RRR, S1/S2 +, no rubs, no gallops Respiratory: CTA bilaterally, no wheezing, no rhonchi Abdominal: Soft, NT, ND, bowel sounds + Extremities: no edema, no cyanosis    The results of significant diagnostics from this hospitalization (including imaging, microbiology, ancillary and laboratory) are listed below for reference.     Microbiology: No results found for this or any previous visit (from the past 240 hour(s)).   Labs: BNP (last 3 results)  Recent Labs  02/09/16 2157  BNP [id number]   Basic Metabolic Panel:  Recent Labs Lab 02/06/16 1945 02/09/16 1247 02/09/16 2158 02/11/16 0153  NA 129* 129* 129* 132*  K 3.8 4.3 3.5 3.0*  CL 94* 94* 96* 100*  CO2 25 26 26 24   GLUCOSE 95 131* 101* 100*  BUN 12 13 14  5*  CREATININE 1.06 1.16 0.80 0.83  CALCIUM 9.5 9.1 8.3* 8.4*   Liver Function Tests: No results for input(s): AST, ALT, ALKPHOS, BILITOT, PROT, ALBUMIN in the last 168 hours. No results for input(s): LIPASE, AMYLASE in the last 168 hours. No results for input(s): AMMONIA in the last 168 hours. CBC:  Recent Labs Lab 02/06/16 1945 02/09/16 1247 02/09/16 2158 02/11/16 0153  WBC 7.2 10.5 6.0 5.2  NEUTROABS 5.6 9.0*  --   --   HGB 10.6* 10.4* 9.4* 8.6*  HCT 30.8* 30.1* 26.6* 24.4*  MCV 92.8 91.8 91.7 92.1  PLT PLATELET CLUMPS NOTED ON SMEAR, UNABLE TO ESTIMATE 174 156 157   Cardiac Enzymes: No results for input(s): CKTOTAL, CKMB, CKMBINDEX, TROPONINI in the last 168 hours. BNP: Invalid input(s): POCBNP CBG:  Recent Labs Lab 02/09/16 2337 02/10/16 0413 02/10/16 0612  GLUCAP 82 69 109*   D-Dimer No results for input(s): DDIMER in the last 72 hours. Hgb A1c No results for input(s): HGBA1C in the last 72 hours. Lipid Profile No results for  input(s): CHOL, HDL, LDLCALC, TRIG, CHOLHDL, LDLDIRECT in the last 72 hours. Thyroid function studies  Recent Labs  02/11/16 0153  TSH 2.672   Anemia work up No results  for input(s): VITAMINB12, FOLATE, FERRITIN, TIBC, IRON, RETICCTPCT in the last 72 hours. Urinalysis    Component Value Date/Time   COLORURINE YELLOW 02/10/2016 0208   APPEARANCEUR CLEAR 02/10/2016 0208   LABSPEC 1.014 02/10/2016 0208   PHURINE 6.0 02/10/2016 0208   GLUCOSEU 50 (A) 02/10/2016 0208   HGBUR NEGATIVE 02/10/2016 0208   BILIRUBINUR NEGATIVE 02/10/2016 0208   KETONESUR NEGATIVE 02/10/2016 0208   PROTEINUR NEGATIVE 02/10/2016 0208   NITRITE NEGATIVE 02/10/2016 0208   LEUKOCYTESUR NEGATIVE 02/10/2016 0208   Sepsis Labs Invalid input(s): PROCALCITONIN,  WBC,  LACTICIDVEN Microbiology No results found for this or any previous visit (from the past 240 hour(s)).   Time coordinating discharge: Over 30 minutes  SIGNED:  Chipper Oman, MD  [facility] 02/11/2016, 9:27 AM Pager   If 7PM-7AM, please contact night-coverage [email or web address] Password [email or web address]

## 2016-02-11 NOTE — Evaluation (Signed)
Occupational Therapy Evaluation Patient Details Name: Dustin [name] MRN: [id number] DOB: 1940-07-13 Today's Date: 02/11/2016    History of Present Illness 75 y.o. 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   Clinical Impression   PT admitted with s/p fall and confusion. Pt currently with functional limitiations due to the deficits listed below (see OT problem list). Pt currently demonstrates safety concerns with adls and hx of falls with multiple admissions. Questions the need for higher level than care. Pts family main to take patient home.  Pt will benefit from skilled OT to increase their independence and safety with adls and balance to allow discharge [place]. Pt refused home services after last discharge per family.     Follow Up Recommendations  Home health OT (questions need for SNF level)    Equipment Recommendations  None recommended by OT    Recommendations for Other Services       Precautions / Restrictions Precautions Precautions: Fall Precaution Comments: high fall risk Restrictions Weight Bearing Restrictions: No      Mobility Bed Mobility Overal bed mobility: Needs Assistance Bed Mobility: Supine to Sit     Supine to sit: HOB elevated;Min assist     General bed mobility comments: Pt requires incr time and use of BIL UE on bed rail  Transfers Overall transfer level: Needs assistance Equipment used: Rolling walker (2 wheeled) Transfers: Sit to/from Stand Sit to Stand: Min guard Stand pivot transfers: Min guard       General transfer comment: cues for hand placment. impulsive to sit quickly    Balance                                            ADL Overall ADL's : Needs assistance/impaired     Grooming: Oral care;Wash/dry hands;Wash/dry  face;Min guard;Standing Grooming Details (indicate cue type and reason): pt required cues for RW Upper Body Bathing: Min guard   Lower Body Bathing: Minimal assistance           Toilet Transfer: Magazine features editor Details (indicate cue type and reason): cues for hand placement with RW   Toileting - Clothing Manipulation Details (indicate cue type and reason): lack of awareness to foley at this time     Functional mobility during ADLs: Minimal assistance General ADL Comments: pt requires constant cues for safety with RW     Vision     Perception     Praxis      Pertinent Vitals/Pain Pain Assessment: No/denies pain     Hand Dominance Left   Extremity/Trunk Assessment Upper Extremity Assessment Upper Extremity Assessment: LUE deficits/detail LUE Deficits / Details: bruising noted but using it functionally throughout session   Lower Extremity Assessment Lower Extremity Assessment: Defer to PT evaluation   Cervical / Trunk Assessment Cervical / Trunk Assessment: Kyphotic   Communication Communication Communication: Expressive difficulties   Cognition Arousal/Alertness: Awake/alert Behavior During Therapy: WFL for tasks assessed/performed Overall Cognitive Status: Impaired/Different from baseline Area of Impairment: Memory;Awareness;Problem solving     Memory: Decreased recall of precautions;Decreased short-term memory   Safety/Judgement: Decreased awareness of safety Awareness: Emergent Problem Solving: Slow processing;Difficulty sequencing;Requires verbal cues General Comments: Pt abandoning the RW and needed cues for safety  throughout session.pt reaching for environmental supports instead of RW   General Comments       Exercises       Shoulder Instructions      Home Living Family/patient expects to be discharged to:: Private residence Living Arrangements: Spouse/significant other;Children Available Help at Discharge: Family;Available 24  hours/day Type of Home: House Home Access: Ramped entrance     Home Layout: Two level;Able to live on main level with bedroom/bathroom Alternate Level Stairs-Number of Steps: 12 Alternate Level Stairs-Rails: Left Bathroom Shower/Tub: Occupational psychologist: Handicapped height     Home Equipment: Wheelchair - manual;Grab bars - tub/shower;Hand held shower head;Shower seat;Walker - 2 wheels   Additional Comments: Pt's daughter in law moved in to assist with care of pt and his wife.  Lives With: Spouse    Prior Functioning/Environment Level of Independence: Independent with assistive device(s)                 OT Problem List: Decreased strength;Decreased activity tolerance;Impaired balance (sitting and/or standing);Decreased coordination;Decreased cognition;Decreased safety awareness;Decreased knowledge of use of DME or AE;Decreased knowledge of precautions   OT Treatment/Interventions: Self-care/ADL training;Therapeutic exercise;Energy conservation;DME and/or AE instruction;Therapeutic activities;Cognitive remediation/compensation;Patient/family education;Balance training    OT Goals(Current goals can be found in the care plan section) Acute Rehab OT Goals Patient Stated Goal: home OT Goal Formulation: With patient Time For Goal Achievement: 02/25/16 Potential to Achieve Goals: Good  OT Frequency: Min 2X/week   Barriers to D/C:            Co-evaluation              End of Session Equipment Utilized During Treatment: Gait belt;Rolling walker Nurse Communication: Mobility status;Precautions  Activity Tolerance: Patient tolerated treatment well Patient left: in chair;with call bell/phone within reach;with chair alarm set   Time: [id number] OT Time Calculation (min): 12 min Charges:  OT General Charges $OT Visit: 1 Procedure OT Evaluation $OT Eval Moderate Complexity: 1 Procedure G-Codes: OT G-codes **NOT FOR INPATIENT CLASS** Functional Assessment  Tool Used: clincial judgement Functional Limitation: Self care Self Care Current Status [id number]): At least 20 percent but less than 40 percent impaired, limited or restricted Self Care Goal Status [id number]): At least 1 percent but less than 20 percent impaired, limited or restricted  Peri Maris 02/11/2016, 10:15 AM   Jeri Modena   OTR/L Pager: [phone] Office: [phone] .

## 2016-02-11 NOTE — Progress Notes (Signed)
[facility]   Patient seen and examined AMS work up so far negative  - likely due to polypharmacy (multiple sedating medications)  Neuro cleared for discharge  Patient will be discharge home today with [facility] See discharge summary for further details.   Chipper Oman, MD

## 2016-02-11 NOTE — Progress Notes (Signed)
Discharge orders received.  Discharge instructions and follow-up appointments reviewed with the patient and his son.  VSS upon discharge.  IV removed and education complete.  Transported out via wheelchair.   Cori Razor, RN

## 2016-02-13 DIAGNOSIS — S51012D Laceration without foreign body of left elbow, subsequent encounter: Secondary | ICD-10-CM | POA: Diagnosis not present

## 2016-02-13 DIAGNOSIS — R338 Other retention of urine: Secondary | ICD-10-CM | POA: Diagnosis not present

## 2016-02-13 DIAGNOSIS — G629 Polyneuropathy, unspecified: Secondary | ICD-10-CM | POA: Diagnosis not present

## 2016-02-13 DIAGNOSIS — Z9181 History of falling: Secondary | ICD-10-CM | POA: Diagnosis not present

## 2016-02-13 DIAGNOSIS — F329 Major depressive disorder, single episode, unspecified: Secondary | ICD-10-CM | POA: Diagnosis not present

## 2016-02-13 DIAGNOSIS — Z87891 Personal history of nicotine dependence: Secondary | ICD-10-CM | POA: Diagnosis not present

## 2016-02-13 DIAGNOSIS — G4700 Insomnia, unspecified: Secondary | ICD-10-CM | POA: Diagnosis not present

## 2016-02-13 DIAGNOSIS — N401 Enlarged prostate with lower urinary tract symptoms: Secondary | ICD-10-CM | POA: Diagnosis not present

## 2016-02-13 DIAGNOSIS — S2242XD Multiple fractures of ribs, left side, subsequent encounter for fracture with routine healing: Secondary | ICD-10-CM | POA: Diagnosis not present

## 2016-02-13 DIAGNOSIS — K219 Gastro-esophageal reflux disease without esophagitis: Secondary | ICD-10-CM | POA: Diagnosis not present

## 2016-02-13 DIAGNOSIS — S0083XD Contusion of other part of head, subsequent encounter: Secondary | ICD-10-CM | POA: Diagnosis not present

## 2016-02-14 DIAGNOSIS — D649 Anemia, unspecified: Secondary | ICD-10-CM | POA: Diagnosis not present

## 2016-02-14 DIAGNOSIS — L089 Local infection of the skin and subcutaneous tissue, unspecified: Secondary | ICD-10-CM | POA: Diagnosis not present

## 2016-02-14 DIAGNOSIS — Z79899 Other long term (current) drug therapy: Secondary | ICD-10-CM | POA: Diagnosis not present

## 2016-02-14 DIAGNOSIS — R410 Disorientation, unspecified: Secondary | ICD-10-CM | POA: Diagnosis not present

## 2016-02-14 DIAGNOSIS — E871 Hypo-osmolality and hyponatremia: Secondary | ICD-10-CM | POA: Diagnosis not present

## 2016-02-14 DIAGNOSIS — F331 Major depressive disorder, recurrent, moderate: Secondary | ICD-10-CM | POA: Diagnosis not present

## 2016-02-15 DIAGNOSIS — S51012D Laceration without foreign body of left elbow, subsequent encounter: Secondary | ICD-10-CM | POA: Diagnosis not present

## 2016-02-15 DIAGNOSIS — G629 Polyneuropathy, unspecified: Secondary | ICD-10-CM | POA: Diagnosis not present

## 2016-02-15 DIAGNOSIS — S0083XD Contusion of other part of head, subsequent encounter: Secondary | ICD-10-CM | POA: Diagnosis not present

## 2016-02-15 DIAGNOSIS — N401 Enlarged prostate with lower urinary tract symptoms: Secondary | ICD-10-CM | POA: Diagnosis not present

## 2016-02-15 DIAGNOSIS — S2242XD Multiple fractures of ribs, left side, subsequent encounter for fracture with routine healing: Secondary | ICD-10-CM | POA: Diagnosis not present

## 2016-02-15 DIAGNOSIS — K219 Gastro-esophageal reflux disease without esophagitis: Secondary | ICD-10-CM | POA: Diagnosis not present

## 2016-02-15 LAB — GLUCOSE, CAPILLARY: Glucose-Capillary: 158 mg/dL — ABNORMAL HIGH (ref 65–99)

## 2016-02-16 DIAGNOSIS — D508 Other iron deficiency anemias: Secondary | ICD-10-CM | POA: Diagnosis not present

## 2016-02-16 DIAGNOSIS — I951 Orthostatic hypotension: Secondary | ICD-10-CM | POA: Diagnosis not present

## 2016-02-16 DIAGNOSIS — F331 Major depressive disorder, recurrent, moderate: Secondary | ICD-10-CM | POA: Diagnosis not present

## 2016-02-17 DIAGNOSIS — N401 Enlarged prostate with lower urinary tract symptoms: Secondary | ICD-10-CM | POA: Diagnosis not present

## 2016-02-17 DIAGNOSIS — S2242XD Multiple fractures of ribs, left side, subsequent encounter for fracture with routine healing: Secondary | ICD-10-CM | POA: Diagnosis not present

## 2016-02-17 DIAGNOSIS — S51012D Laceration without foreign body of left elbow, subsequent encounter: Secondary | ICD-10-CM | POA: Diagnosis not present

## 2016-02-17 DIAGNOSIS — S0083XD Contusion of other part of head, subsequent encounter: Secondary | ICD-10-CM | POA: Diagnosis not present

## 2016-02-17 DIAGNOSIS — K219 Gastro-esophageal reflux disease without esophagitis: Secondary | ICD-10-CM | POA: Diagnosis not present

## 2016-02-17 DIAGNOSIS — G629 Polyneuropathy, unspecified: Secondary | ICD-10-CM | POA: Diagnosis not present

## 2016-02-20 DIAGNOSIS — N401 Enlarged prostate with lower urinary tract symptoms: Secondary | ICD-10-CM | POA: Diagnosis not present

## 2016-02-20 DIAGNOSIS — S51012D Laceration without foreign body of left elbow, subsequent encounter: Secondary | ICD-10-CM | POA: Diagnosis not present

## 2016-02-20 DIAGNOSIS — S2242XD Multiple fractures of ribs, left side, subsequent encounter for fracture with routine healing: Secondary | ICD-10-CM | POA: Diagnosis not present

## 2016-02-20 DIAGNOSIS — K219 Gastro-esophageal reflux disease without esophagitis: Secondary | ICD-10-CM | POA: Diagnosis not present

## 2016-02-20 DIAGNOSIS — G629 Polyneuropathy, unspecified: Secondary | ICD-10-CM | POA: Diagnosis not present

## 2016-02-20 DIAGNOSIS — S0083XD Contusion of other part of head, subsequent encounter: Secondary | ICD-10-CM | POA: Diagnosis not present

## 2016-02-21 DIAGNOSIS — N401 Enlarged prostate with lower urinary tract symptoms: Secondary | ICD-10-CM | POA: Diagnosis not present

## 2016-02-21 DIAGNOSIS — S2242XD Multiple fractures of ribs, left side, subsequent encounter for fracture with routine healing: Secondary | ICD-10-CM | POA: Diagnosis not present

## 2016-02-21 DIAGNOSIS — S0083XD Contusion of other part of head, subsequent encounter: Secondary | ICD-10-CM | POA: Diagnosis not present

## 2016-02-21 DIAGNOSIS — G629 Polyneuropathy, unspecified: Secondary | ICD-10-CM | POA: Diagnosis not present

## 2016-02-21 DIAGNOSIS — S51012D Laceration without foreign body of left elbow, subsequent encounter: Secondary | ICD-10-CM | POA: Diagnosis not present

## 2016-02-21 DIAGNOSIS — K219 Gastro-esophageal reflux disease without esophagitis: Secondary | ICD-10-CM | POA: Diagnosis not present

## 2016-02-22 DIAGNOSIS — S2242XD Multiple fractures of ribs, left side, subsequent encounter for fracture with routine healing: Secondary | ICD-10-CM | POA: Diagnosis not present

## 2016-02-22 DIAGNOSIS — S51012D Laceration without foreign body of left elbow, subsequent encounter: Secondary | ICD-10-CM | POA: Diagnosis not present

## 2016-02-22 DIAGNOSIS — S0083XD Contusion of other part of head, subsequent encounter: Secondary | ICD-10-CM | POA: Diagnosis not present

## 2016-02-22 DIAGNOSIS — K219 Gastro-esophageal reflux disease without esophagitis: Secondary | ICD-10-CM | POA: Diagnosis not present

## 2016-02-22 DIAGNOSIS — N401 Enlarged prostate with lower urinary tract symptoms: Secondary | ICD-10-CM | POA: Diagnosis not present

## 2016-02-22 DIAGNOSIS — G629 Polyneuropathy, unspecified: Secondary | ICD-10-CM | POA: Diagnosis not present

## 2016-02-23 DIAGNOSIS — G629 Polyneuropathy, unspecified: Secondary | ICD-10-CM | POA: Diagnosis not present

## 2016-02-23 DIAGNOSIS — S2242XD Multiple fractures of ribs, left side, subsequent encounter for fracture with routine healing: Secondary | ICD-10-CM | POA: Diagnosis not present

## 2016-02-23 DIAGNOSIS — S51012D Laceration without foreign body of left elbow, subsequent encounter: Secondary | ICD-10-CM | POA: Diagnosis not present

## 2016-02-23 DIAGNOSIS — K219 Gastro-esophageal reflux disease without esophagitis: Secondary | ICD-10-CM | POA: Diagnosis not present

## 2016-02-23 DIAGNOSIS — N401 Enlarged prostate with lower urinary tract symptoms: Secondary | ICD-10-CM | POA: Diagnosis not present

## 2016-02-23 DIAGNOSIS — S0083XD Contusion of other part of head, subsequent encounter: Secondary | ICD-10-CM | POA: Diagnosis not present

## 2016-02-27 DIAGNOSIS — K219 Gastro-esophageal reflux disease without esophagitis: Secondary | ICD-10-CM | POA: Diagnosis not present

## 2016-02-27 DIAGNOSIS — N401 Enlarged prostate with lower urinary tract symptoms: Secondary | ICD-10-CM | POA: Diagnosis not present

## 2016-02-27 DIAGNOSIS — S51012D Laceration without foreign body of left elbow, subsequent encounter: Secondary | ICD-10-CM | POA: Diagnosis not present

## 2016-02-27 DIAGNOSIS — S0083XD Contusion of other part of head, subsequent encounter: Secondary | ICD-10-CM | POA: Diagnosis not present

## 2016-02-27 DIAGNOSIS — S2242XD Multiple fractures of ribs, left side, subsequent encounter for fracture with routine healing: Secondary | ICD-10-CM | POA: Diagnosis not present

## 2016-02-27 DIAGNOSIS — G629 Polyneuropathy, unspecified: Secondary | ICD-10-CM | POA: Diagnosis not present

## 2016-02-29 DIAGNOSIS — N401 Enlarged prostate with lower urinary tract symptoms: Secondary | ICD-10-CM | POA: Diagnosis not present

## 2016-02-29 DIAGNOSIS — S0083XD Contusion of other part of head, subsequent encounter: Secondary | ICD-10-CM | POA: Diagnosis not present

## 2016-02-29 DIAGNOSIS — K219 Gastro-esophageal reflux disease without esophagitis: Secondary | ICD-10-CM | POA: Diagnosis not present

## 2016-02-29 DIAGNOSIS — S51012D Laceration without foreign body of left elbow, subsequent encounter: Secondary | ICD-10-CM | POA: Diagnosis not present

## 2016-02-29 DIAGNOSIS — G629 Polyneuropathy, unspecified: Secondary | ICD-10-CM | POA: Diagnosis not present

## 2016-02-29 DIAGNOSIS — S2242XD Multiple fractures of ribs, left side, subsequent encounter for fracture with routine healing: Secondary | ICD-10-CM | POA: Diagnosis not present

## 2016-03-05 DIAGNOSIS — G4700 Insomnia, unspecified: Secondary | ICD-10-CM | POA: Diagnosis not present

## 2016-03-05 DIAGNOSIS — K219 Gastro-esophageal reflux disease without esophagitis: Secondary | ICD-10-CM | POA: Diagnosis not present

## 2016-03-05 DIAGNOSIS — N401 Enlarged prostate with lower urinary tract symptoms: Secondary | ICD-10-CM | POA: Diagnosis not present

## 2016-03-05 DIAGNOSIS — Z79899 Other long term (current) drug therapy: Secondary | ICD-10-CM | POA: Diagnosis not present

## 2016-03-05 DIAGNOSIS — D649 Anemia, unspecified: Secondary | ICD-10-CM | POA: Diagnosis not present

## 2016-03-05 DIAGNOSIS — S2242XD Multiple fractures of ribs, left side, subsequent encounter for fracture with routine healing: Secondary | ICD-10-CM | POA: Diagnosis not present

## 2016-03-05 DIAGNOSIS — S0083XD Contusion of other part of head, subsequent encounter: Secondary | ICD-10-CM | POA: Diagnosis not present

## 2016-03-05 DIAGNOSIS — E7800 Pure hypercholesterolemia, unspecified: Secondary | ICD-10-CM | POA: Diagnosis not present

## 2016-03-05 DIAGNOSIS — G629 Polyneuropathy, unspecified: Secondary | ICD-10-CM | POA: Diagnosis not present

## 2016-03-05 DIAGNOSIS — Z0001 Encounter for general adult medical examination with abnormal findings: Secondary | ICD-10-CM | POA: Diagnosis not present

## 2016-03-05 DIAGNOSIS — S51012D Laceration without foreign body of left elbow, subsequent encounter: Secondary | ICD-10-CM | POA: Diagnosis not present

## 2016-03-09 DIAGNOSIS — S2242XD Multiple fractures of ribs, left side, subsequent encounter for fracture with routine healing: Secondary | ICD-10-CM | POA: Diagnosis not present

## 2016-03-09 DIAGNOSIS — N401 Enlarged prostate with lower urinary tract symptoms: Secondary | ICD-10-CM | POA: Diagnosis not present

## 2016-03-09 DIAGNOSIS — S0083XD Contusion of other part of head, subsequent encounter: Secondary | ICD-10-CM | POA: Diagnosis not present

## 2016-03-09 DIAGNOSIS — G629 Polyneuropathy, unspecified: Secondary | ICD-10-CM | POA: Diagnosis not present

## 2016-03-09 DIAGNOSIS — S51012D Laceration without foreign body of left elbow, subsequent encounter: Secondary | ICD-10-CM | POA: Diagnosis not present

## 2016-03-09 DIAGNOSIS — K219 Gastro-esophageal reflux disease without esophagitis: Secondary | ICD-10-CM | POA: Diagnosis not present

## 2016-03-12 DIAGNOSIS — S0083XD Contusion of other part of head, subsequent encounter: Secondary | ICD-10-CM | POA: Diagnosis not present

## 2016-03-12 DIAGNOSIS — K219 Gastro-esophageal reflux disease without esophagitis: Secondary | ICD-10-CM | POA: Diagnosis not present

## 2016-03-12 DIAGNOSIS — S51012D Laceration without foreign body of left elbow, subsequent encounter: Secondary | ICD-10-CM | POA: Diagnosis not present

## 2016-03-12 DIAGNOSIS — N401 Enlarged prostate with lower urinary tract symptoms: Secondary | ICD-10-CM | POA: Diagnosis not present

## 2016-03-12 DIAGNOSIS — G629 Polyneuropathy, unspecified: Secondary | ICD-10-CM | POA: Diagnosis not present

## 2016-03-12 DIAGNOSIS — S2242XD Multiple fractures of ribs, left side, subsequent encounter for fracture with routine healing: Secondary | ICD-10-CM | POA: Diagnosis not present

## 2016-03-19 DIAGNOSIS — N401 Enlarged prostate with lower urinary tract symptoms: Secondary | ICD-10-CM | POA: Diagnosis not present

## 2016-03-19 DIAGNOSIS — S0083XD Contusion of other part of head, subsequent encounter: Secondary | ICD-10-CM | POA: Diagnosis not present

## 2016-03-19 DIAGNOSIS — S51012D Laceration without foreign body of left elbow, subsequent encounter: Secondary | ICD-10-CM | POA: Diagnosis not present

## 2016-03-19 DIAGNOSIS — S2242XD Multiple fractures of ribs, left side, subsequent encounter for fracture with routine healing: Secondary | ICD-10-CM | POA: Diagnosis not present

## 2016-03-19 DIAGNOSIS — K219 Gastro-esophageal reflux disease without esophagitis: Secondary | ICD-10-CM | POA: Diagnosis not present

## 2016-03-19 DIAGNOSIS — G629 Polyneuropathy, unspecified: Secondary | ICD-10-CM | POA: Diagnosis not present

## 2016-03-20 DIAGNOSIS — L03115 Cellulitis of right lower limb: Secondary | ICD-10-CM | POA: Diagnosis not present

## 2016-03-22 DIAGNOSIS — L03115 Cellulitis of right lower limb: Secondary | ICD-10-CM | POA: Diagnosis not present

## 2016-03-26 DIAGNOSIS — S2242XD Multiple fractures of ribs, left side, subsequent encounter for fracture with routine healing: Secondary | ICD-10-CM | POA: Diagnosis not present

## 2016-03-26 DIAGNOSIS — K219 Gastro-esophageal reflux disease without esophagitis: Secondary | ICD-10-CM | POA: Diagnosis not present

## 2016-03-26 DIAGNOSIS — S0083XD Contusion of other part of head, subsequent encounter: Secondary | ICD-10-CM | POA: Diagnosis not present

## 2016-03-26 DIAGNOSIS — N401 Enlarged prostate with lower urinary tract symptoms: Secondary | ICD-10-CM | POA: Diagnosis not present

## 2016-03-26 DIAGNOSIS — S51012D Laceration without foreign body of left elbow, subsequent encounter: Secondary | ICD-10-CM | POA: Diagnosis not present

## 2016-03-26 DIAGNOSIS — G629 Polyneuropathy, unspecified: Secondary | ICD-10-CM | POA: Diagnosis not present

## 2016-03-27 ENCOUNTER — Encounter: Payer: Self-pay | Admitting: Nurse Practitioner

## 2016-03-27 ENCOUNTER — Ambulatory Visit (INDEPENDENT_AMBULATORY_CARE_PROVIDER_SITE_OTHER): Payer: Medicare Other | Admitting: Nurse Practitioner

## 2016-03-27 VITALS — BP 115/69 | HR 84 | Ht 74.0 in | Wt 189.4 lb

## 2016-03-27 DIAGNOSIS — R413 Other amnesia: Secondary | ICD-10-CM | POA: Diagnosis not present

## 2016-03-27 DIAGNOSIS — G6289 Other specified polyneuropathies: Secondary | ICD-10-CM

## 2016-03-27 DIAGNOSIS — R2681 Unsteadiness on feet: Secondary | ICD-10-CM | POA: Diagnosis not present

## 2016-03-27 DIAGNOSIS — G608 Other hereditary and idiopathic neuropathies: Secondary | ICD-10-CM

## 2016-03-27 MED ORDER — OXCARBAZEPINE 150 MG PO TABS: 150.0000 mg | ORAL_TABLET | Freq: Two times a day (BID) | ORAL | 3 refills | Status: DC

## 2016-03-27 NOTE — Progress Notes (Signed)
[facility]  PATIENT: Dustin [name] DOB: [birth year]   REASON FOR VISIT: Follow-up for memory loss, peripheral neuropathy recent hospital admission for confusion and fall at home HISTORY FROM: Patient alone at visit    HISTORY OF PRESENT ILLNESS:UPDATE 02/07/2018CM Dustin [name], 76 year old male returns for follow-up with history of bilateral feet paresthesias and pain. In addition he has memory loss. He was admitted to the hospital in December for confusion and unsteady gait. MRI of the brain without significant abnormality , mild chronic microvascular ischemic changes.  MRI of the C-spine and T-spine without significant abnormality. EEG done without evidence of seizure. Hemoglobin was low at 8.6. He is now on iron supplement. His feet paresthesias are in good control with Trileptal and Metanx. He is now on Seroquel for  depression . After his hospitalization he received physical therapy for approximately a month. He feels he is back to baseline. He denies further falls or periods of confusion. He returns for reevaluation. He is the caregiver for his wife    UPDATE 09/25/2015 CM Dustin [name], 76 year old male returns for follow-up. He has previously been followed in this office by Dr. Krista Blue. His bilateral feet paresthesias are much better however he is complaining of pain in the bilateral thigh region. According to Dr. Rhea Belton last note, he was to continue Trileptal and nortriptyline. He thinks he is taking nortriptyline at night but is not sure about Trileptal. These have been taking off HIS medication list. He is on Metanx. He continues to go to  counseling for his depression and he is currently on Seroquel and Abilify. He continues to care for his wife who has COPD. He has a long history of low back pain and sees Dr. Vertell Limber about every year for injections. He returns for reevaluation  REVIEW OF SYSTEMS: Full 14 system review of systems performed and notable only for those  listed, all others are neg:  Constitutional: neg  Cardiovascular: neg Ear/Nose/Throat: Hard of hearing Skin: neg Eyes: neg Respiratory: neg Gastroitestinal: neg  Hematology/Lymphatic: Anemia Endocrine: neg Musculoskeletal:neg Allergy/Immunology: neg Neurological: Confusion and memory loss Psychiatric: Depression Sleep : neg   ALLERGIES: No Known Allergies  HOME MEDICATIONS: Outpatient Medications Prior to Visit  Medication Sig Dispense Refill  . finasteride (PROSCAR) 5 MG tablet Take 5 mg by mouth daily.      Marland Kitchen L-Methylfolate-Algae-B12-B6 (METANX) 3-90.314-2-35 MG CAPS Take one capsule daily. 90 capsule 3  . OXcarbazepine (TRILEPTAL) 150 MG tablet Take 1 tablet (150 mg total) by mouth 2 (two) times daily. 60 tablet 6  . pantoprazole (PROTONIX) 40 MG tablet Take 40 mg by mouth daily.      . polyethylene glycol (MIRALAX / GLYCOLAX) packet Take 17 g by mouth daily as needed for mild constipation. 30 each 0  . QUEtiapine (SEROQUEL) 300 MG tablet Take 600 mg by mouth at bedtime. Reported on 05/10/2015    . lamoTRIgine (LAMICTAL) 100 MG tablet Take 100 mg by mouth daily.    . cholecalciferol (VITAMIN D) 1000 UNITS tablet Take 1,000 Units by mouth daily.     . Omega-3 Fatty Acids (FISH OIL PO) Take 1 tablet by mouth daily.    . OXYCONTIN 20 MG T12A 12 hr tablet TAKE 1 TABLET BY ORAL ROUTE EVERY 12 HOURS (DNF 09/24/14)  0  . temazepam (RESTORIL) 15 MG capsule Take 15 mg by mouth at bedtime.    . vitamin B-12 (CYANOCOBALAMIN) 1000 MCG tablet Take 1,000 mcg by mouth daily.     No  facility-administered medications prior to visit.     PAST MEDICAL HISTORY: Past Medical History:  Diagnosis Date  . BPH (benign prostatic hyperplasia)   . Chronic combined systolic and diastolic congestive heart failure ([place])   . GERD (gastroesophageal reflux disease)    protonix for control  . Insomnia   . Neck pain   . Neuropathy ([place])     PAST SURGICAL HISTORY: Past Surgical History:  Procedure  Laterality Date  . ANTERIOR CERVICAL DECOMPRESSION/DISCECTOMY FUSION 4 LEVELS N/A 10/27/2012   Procedure: Cervical Three-Four Cervical Four-Five Cervical Five-Six Cervical Six-Seven  Anterior cervical decompression/diskectomy/fusion;  Surgeon: Erline Levine, MD;  Location: [facility];  Service: Neurosurgery;  Laterality: N/A;  Cervical Three-Four Cervical Four-Five Cervical Five-Six Cervical Six-Seven  Anterior cervical decompression/diskectomy/fusion  . SHOULDER ARTHROSCOPY  2010   rt shoulder  . SHOULDER ARTHROSCOPY  02/15/2011   Procedure: ARTHROSCOPY SHOULDER;  Surgeon: Cynda Familia;  Location: [facility];  Service: Orthopedics;  Laterality: Right;  Debridement  . TONSILLECTOMY      FAMILY HISTORY: Family History  Problem Relation Age of Onset  . Emphysema Mother   . Cirrhosis Father   . Stroke Brother     SOCIAL HISTORY: Social History   Social History  . Marital status: Married    Spouse name: Dustin [name]  . Number of children: 2  . Years of education: college   Occupational History  . retired    Social History Main Topics  . Smoking status: Former Smoker    Packs/day: 1.50    Quit date: 01/27/1990  . Smokeless tobacco: Never Used  . Alcohol use No  . Drug use: No  . Sexual activity: Not on file   Other Topics Concern  . Not on file   Social History Narrative   Pt lives home with wife Dustin [name])   Pt is retired   Education college    Pt is left handed   Pt consumes 2 cups of coffee daily     PHYSICAL EXAM  Vitals:   03/27/16 0919  BP: 115/69  Pulse: 84  Weight: 189 lb 6.4 oz (85.9 kg)  Height: 6\' 2"  (1.88 m)   Body mass index is 24.32 kg/m.  Generalized: Well developed, in no acute distress  Head: normocephalic and atraumatic,. Oropharynx benign  Neck: Supple, no carotid bruits  Cardiac: Regular rate rhythm, no murmur  Musculoskeletal: No deformity   Neurological examination   Mentation: Alert oriented to time, place,  history taking. Attention span and concentration appropriate. Recent and remote memory intact.  Follows all commands speech and language fluent. MMSE 30/30. AFT 9. Clock drawing 4/4.  Cranial nerve II-XII: Pupils were equal round reactive to light extraocular movements were full, visual field were full on confrontational test. Facial sensation and strength were normal. hearing was intact to finger rubbing bilaterally. Uvula tongue midline. head turning and shoulder shrug were normal and symmetric.Tongue protrusion into cheek strength was normal. Motor: normal bulk and tone, full strength in the BUE, BLE, except mild weakness of dorsiflexors of both feet , fine finger movements normal, no pronator drift. No focal weakness Sensory: normal and symmetric to light touch, pinprick, and  Vibration, in the upper and lower extremities  Coordination: finger-nose-finger, heel-to-shin bilaterally, no dysmetria Reflexes: Brachioradialis 2/2, biceps 2/2, triceps 2/2, patellar 2/2, Achilles 2/2, plantar responses were flexor bilaterally. Gait and Station: Rising up from seated position without assistance, normal stance,  moderate stride, good arm swing, smooth turning, able to perform tiptoe, and  heel walking without difficulty. Tandem gait is steady. No assistive device  DIAGNOSTIC DATA (LABS, IMAGING, TESTING) - I reviewed patient records, labs, notes, testing and imaging myself where available.  Lab Results  Component Value Date   WBC 5.2 02/11/2016   HGB 8.6 (L) 02/11/2016   HCT 24.4 (L) 02/11/2016   MCV 92.1 02/11/2016   PLT 157 02/11/2016      Component Value Date/Time   NA 132 (L) 02/11/2016 0153   NA 128 (L) 01/30/2015 0809   K 3.0 (L) 02/11/2016 0153   CL 100 (L) 02/11/2016 0153   CO2 24 02/11/2016 0153   GLUCOSE 100 (H) 02/11/2016 0153   BUN 5 (L) 02/11/2016 0153   BUN 18 01/30/2015 0809   CREATININE 0.83 02/11/2016 0153   CALCIUM 8.4 (L) 02/11/2016 0153   PROT 6.0 (L) 01/30/2016 1920    PROT 6.3 01/30/2015 0809   ALBUMIN 4.0 01/30/2016 1920   ALBUMIN 4.2 01/30/2015 0809   AST 22 01/30/2016 1920   ALT 15 (L) 01/30/2016 1920   ALKPHOS 71 01/30/2016 1920   BILITOT 0.8 01/30/2016 1920   BILITOT 0.4 01/30/2015 0809   GFRNONAA >60 02/11/2016 0153   GFRAA >60 02/11/2016 0153   Lab Results  Component Value Date   CHOL 143 01/23/2016   HDL 54 01/23/2016   LDLCALC 77 01/23/2016   TRIG 60 01/23/2016   CHOLHDL 2.6 01/23/2016   Lab Results  Component Value Date   HGBA1C 5.5 01/23/2016   Lab Results  Component Value Date   VITAMINB12 2,613 (H) 01/23/2016   Lab Results  Component Value Date   TSH 2.672 02/11/2016      ASSESSMENT AND PLAN  77 y.o. year old male  has a past medical history of Chronic combined systolic and diastolic congestive heart failure ([place]);; Insomnia; Neck pain; and Neuropathy ([place]). and depression here to follow-up. He also has mild cognitive impairment recent hospital admission for fall and confusion. Hospital records reviewed   Continue Trileptal 150 mg twice a day for neuropathy Continue Metanx for neuropathy Due to neuropathy be careful with ambulation due to have some mild weakness in both feet which increases risk for falling Follow-up in 6 months next with Dr. Krista Blue I spent 25 min  in total face to face time with the patient more than 50% of which was spent counseling and coordination of care, reviewing test results and records from recent hospital admission reviewing medications and discussing and reviewing the diagnosis of mild cognitive impairment, gait disorder and neuropathy Dennie Bible, [facility], [facility], APRN  [facility] [address] [place], [place] [address] [phone]

## 2016-03-27 NOTE — Patient Instructions (Addendum)
Continue Trileptal 150 mg twice a day for neuropathy Continue Metanx for neuropathy Be careful with ambulation Follow-up in 6 months next with Dr. Krista Blue

## 2016-03-27 NOTE — Progress Notes (Signed)
I have reviewed and agreed above plan. 

## 2016-04-06 DIAGNOSIS — S0083XD Contusion of other part of head, subsequent encounter: Secondary | ICD-10-CM | POA: Diagnosis not present

## 2016-04-06 DIAGNOSIS — K219 Gastro-esophageal reflux disease without esophagitis: Secondary | ICD-10-CM | POA: Diagnosis not present

## 2016-04-06 DIAGNOSIS — G629 Polyneuropathy, unspecified: Secondary | ICD-10-CM | POA: Diagnosis not present

## 2016-04-06 DIAGNOSIS — S51012D Laceration without foreign body of left elbow, subsequent encounter: Secondary | ICD-10-CM | POA: Diagnosis not present

## 2016-04-06 DIAGNOSIS — S2242XD Multiple fractures of ribs, left side, subsequent encounter for fracture with routine healing: Secondary | ICD-10-CM | POA: Diagnosis not present

## 2016-04-06 DIAGNOSIS — N401 Enlarged prostate with lower urinary tract symptoms: Secondary | ICD-10-CM | POA: Diagnosis not present

## 2016-04-11 DIAGNOSIS — N401 Enlarged prostate with lower urinary tract symptoms: Secondary | ICD-10-CM | POA: Diagnosis not present

## 2016-04-11 DIAGNOSIS — G629 Polyneuropathy, unspecified: Secondary | ICD-10-CM | POA: Diagnosis not present

## 2016-04-11 DIAGNOSIS — K219 Gastro-esophageal reflux disease without esophagitis: Secondary | ICD-10-CM | POA: Diagnosis not present

## 2016-04-11 DIAGNOSIS — S0083XD Contusion of other part of head, subsequent encounter: Secondary | ICD-10-CM | POA: Diagnosis not present

## 2016-04-11 DIAGNOSIS — S2242XD Multiple fractures of ribs, left side, subsequent encounter for fracture with routine healing: Secondary | ICD-10-CM | POA: Diagnosis not present

## 2016-04-11 DIAGNOSIS — S51012D Laceration without foreign body of left elbow, subsequent encounter: Secondary | ICD-10-CM | POA: Diagnosis not present

## 2016-05-20 DIAGNOSIS — R29898 Other symptoms and signs involving the musculoskeletal system: Secondary | ICD-10-CM | POA: Diagnosis not present

## 2016-05-20 DIAGNOSIS — M5090 Cervical disc disorder, unspecified, unspecified cervical region: Secondary | ICD-10-CM | POA: Diagnosis not present

## 2016-05-20 DIAGNOSIS — G4700 Insomnia, unspecified: Secondary | ICD-10-CM | POA: Diagnosis not present

## 2016-05-21 DIAGNOSIS — Z85828 Personal history of other malignant neoplasm of skin: Secondary | ICD-10-CM | POA: Diagnosis not present

## 2016-05-21 DIAGNOSIS — L814 Other melanin hyperpigmentation: Secondary | ICD-10-CM | POA: Diagnosis not present

## 2016-05-21 DIAGNOSIS — L570 Actinic keratosis: Secondary | ICD-10-CM | POA: Diagnosis not present

## 2016-05-21 DIAGNOSIS — L821 Other seborrheic keratosis: Secondary | ICD-10-CM | POA: Diagnosis not present

## 2016-05-21 DIAGNOSIS — D1801 Hemangioma of skin and subcutaneous tissue: Secondary | ICD-10-CM | POA: Diagnosis not present

## 2016-05-26 DIAGNOSIS — G2571 Drug induced akathisia: Secondary | ICD-10-CM | POA: Diagnosis not present

## 2016-05-26 DIAGNOSIS — G2581 Restless legs syndrome: Secondary | ICD-10-CM | POA: Diagnosis not present

## 2016-05-27 ENCOUNTER — Telehealth: Payer: Self-pay | Admitting: Neurology

## 2016-05-27 NOTE — Telephone Encounter (Signed)
Nita with Dr. Versie Starks office called our office requesting patient to be see sooner per provider.  Patient is needing to fu for restless leg and akathisia.  Patient has an appointment with Dr. Krista Blue in August but Dr. Versie Starks office is requesting within the week.  Please call

## 2016-05-27 NOTE — Telephone Encounter (Signed)
Returned call to [facility] - pt's appt has been moved to 05/28/16 at 10am - arrival time 9:30am.  She will notify patient of new appt time.

## 2016-05-28 ENCOUNTER — Encounter (INDEPENDENT_AMBULATORY_CARE_PROVIDER_SITE_OTHER): Payer: Self-pay

## 2016-05-28 ENCOUNTER — Encounter: Payer: Self-pay | Admitting: Neurology

## 2016-05-28 ENCOUNTER — Ambulatory Visit (INDEPENDENT_AMBULATORY_CARE_PROVIDER_SITE_OTHER): Payer: Medicare Other | Admitting: Neurology

## 2016-05-28 VITALS — BP 134/79 | HR 78 | Ht 74.0 in | Wt 209.0 lb

## 2016-05-28 DIAGNOSIS — G2581 Restless legs syndrome: Secondary | ICD-10-CM

## 2016-05-28 DIAGNOSIS — G308 Other Alzheimer's disease: Secondary | ICD-10-CM | POA: Diagnosis not present

## 2016-05-28 DIAGNOSIS — D509 Iron deficiency anemia, unspecified: Secondary | ICD-10-CM | POA: Diagnosis not present

## 2016-05-28 DIAGNOSIS — R799 Abnormal finding of blood chemistry, unspecified: Secondary | ICD-10-CM | POA: Diagnosis not present

## 2016-05-28 DIAGNOSIS — G608 Other hereditary and idiopathic neuropathies: Secondary | ICD-10-CM

## 2016-05-28 DIAGNOSIS — G6289 Other specified polyneuropathies: Secondary | ICD-10-CM

## 2016-05-28 MED ORDER — FERROUS FUM-IRON POLYSACCH 162-115.2 MG PO CAPS
1.0000 | ORAL_CAPSULE | Freq: Every day | ORAL | 11 refills | Status: DC
Start: 2016-05-28 — End: 2017-06-07

## 2016-05-28 MED ORDER — GABAPENTIN 300 MG PO CAPS: 900.0000 mg | ORAL_CAPSULE | Freq: Every day | ORAL | 11 refills | Status: DC

## 2016-05-28 MED ORDER — CLONAZEPAM 0.5 MG PO TABS: 0.5000 mg | ORAL_TABLET | Freq: Two times a day (BID) | ORAL | 5 refills | Status: DC | PRN

## 2016-05-28 NOTE — Progress Notes (Signed)
[facility]  PATIENT: Dustin [name] DOB: 05/13/1940  HISTORY OF PRESENT ILLNESS Patient had long-standing history of mild peripheral neuropathy, has been taking Trileptal 150 mg twice a day for bilateral feet paresthesia, also taking Metanx, had a history of depression, on polypharmacy treatment, seroquel 300 mg at nighttime.  He was referred urgently back to our clinic by his primary care physician Dr. Dierdre Forth for worsening difficulty sleeping, urge to move his leg,  Most recent office visit with Dr. Mariea Clonts was on May 26 2016, I reviewed and summarized referring note, he had a history of esophageal reflux, esophagitis,  He complains of worsening restless leg symptoms, difficulty holding his leg still when relaxed, trying to go to sleep, he reported 48 hours without sleeping because of the leg discomfort, urge to move  He continues to complains bilateral feet numbness tingling needle prick, at the plantar surface, below the ankle,  He denies bowel and bladder incontinence, denies low back pain,   I reviewed the laboratory evaluation in December 2017, INR was 1.22, normal TSH, free T4, CBC showed anemia hemoglobin of 8.6, HCT of 24, BMP showed glucose of 100, calcium of 8.4, negative RPR,  He was diagnosed with iron deficiency anemia, has been treated with iron supplements since March 2018,  He tried gabapentin and Lyrica in the past, did not help, Lyrica made him feel funny,  I reviewed CT head on February 09 2016, no acute abnormality, mild generalized atrophy, supratentorium small vessel disease,  He was started on Requip 1 mg recently, only taking 0.25 milligrams every night, no significant improvement  He denies significant low back pain, no bowel and bladder incontinence, no bilateral fingertips paresthesia.  REVIEW OF SYSTEMS: Full 14 system review of systems performed and notable only for those listed, all others are neg:   ALLERGIES: No Known  Allergies  HOME MEDICATIONS: Outpatient Medications Prior to Visit  Medication Sig Dispense Refill  . Ferrous Sulfate (IRON) 325 (65 Fe) MG TABS Take by mouth. One daily    . finasteride (PROSCAR) 5 MG tablet Take 5 mg by mouth daily.      Marland Kitchen L-Methylfolate-Algae-B12-B6 (METANX) 3-90.314-2-35 MG CAPS Take one capsule daily. 90 capsule 3  . Multiple Vitamins-Minerals (MULTIVITAMIN WITH MINERALS) tablet Take 1 tablet by mouth daily.    . OXcarbazepine (TRILEPTAL) 150 MG tablet Take 1 tablet (150 mg total) by mouth 2 (two) times daily. 180 tablet 3  . pantoprazole (PROTONIX) 40 MG tablet Take 40 mg by mouth daily.      . polyethylene glycol (MIRALAX / GLYCOLAX) packet Take 17 g by mouth daily as needed for mild constipation. 30 each 0  . QUEtiapine (SEROQUEL) 300 MG tablet Take 600 mg by mouth at bedtime. Reported on 05/10/2015     No facility-administered medications prior to visit.     PAST MEDICAL HISTORY: Past Medical History:  Diagnosis Date  . BPH (benign prostatic hyperplasia)   . Chronic combined systolic and diastolic congestive heart failure ([place])   . GERD (gastroesophageal reflux disease)    protonix for control  . Insomnia   . Neck pain   . Neuropathy ([place])     PAST SURGICAL HISTORY: Past Surgical History:  Procedure Laterality Date  . ANTERIOR CERVICAL DECOMPRESSION/DISCECTOMY FUSION 4 LEVELS N/A 10/27/2012   Procedure: Cervical Three-Four Cervical Four-Five Cervical Five-Six Cervical Six-Seven  Anterior cervical decompression/diskectomy/fusion;  Surgeon: Erline Levine, MD;  Location: [facility];  Service: Neurosurgery;  Laterality: N/A;  Cervical Three-Four  Cervical Four-Five Cervical Five-Six Cervical Six-Seven  Anterior cervical decompression/diskectomy/fusion  . SHOULDER ARTHROSCOPY  2010   rt shoulder  . SHOULDER ARTHROSCOPY  02/15/2011   Procedure: ARTHROSCOPY SHOULDER;  Surgeon: Cynda Familia;  Location: [facility];  Service: Orthopedics;   Laterality: Right;  Debridement  . TONSILLECTOMY      FAMILY HISTORY: Family History  Problem Relation Age of Onset  . Emphysema Mother   . Cirrhosis Father   . Stroke Brother     SOCIAL HISTORY: Social History   Social History  . Marital status: Married    Spouse name: [name]  . Number of children: 2  . Years of education: college   Occupational History  . retired    Social History Main Topics  . Smoking status: Former Smoker    Packs/day: 1.50    Quit date: 01/27/1990  . Smokeless tobacco: Never Used  . Alcohol use No  . Drug use: No  . Sexual activity: Not on file   Other Topics Concern  . Not on file   Social History Narrative   [name]   Pt is retired   Education college    Pt is left handed   Pt consumes 2 cups of coffee daily     PHYSICAL EXAM  Vitals:   05/28/16 1153  BP: 134/79  Pulse: 78  Weight: 209 lb (94.8 kg)  Height: 6\' 2"  (1.88 m)   Body mass index is 26.83 kg/m.   PHYSICAL EXAMNIATION:  Gen: NAD, conversant, well nourised, obese, well groomed                     Cardiovascular: Regular rate rhythm, no peripheral edema, warm, nontender. Eyes: Conjunctivae clear without exudates or hemorrhage Neck: Supple, no carotid bruits. Pulmonary: Clear to auscultation bilaterally   NEUROLOGICAL EXAM:  MENTAL STATUS: Speech:    Speech is normal; fluent and spontaneous with normal comprehension.  Cognition:     Orientation to time, place and person     Normal recent and remote memory     Normal Attention span and concentration     Normal Language, naming, repeating,spontaneous speech     Fund of knowledge   CRANIAL NERVES: CN II: Visual fields are full to confrontation. Fundoscopic exam is normal with sharp discs and no vascular changes. Pupils are round equal and briskly reactive to light. CN III, IV, VI: extraocular movement are normal. No ptosis. CN V: Facial sensation is intact to pinprick in all 3  divisions bilaterally. Corneal responses are intact.  CN VII: Face is symmetric with normal eye closure and smile. CN VIII: Hearing is normal to rubbing fingers CN IX, X: Palate elevates symmetrically. Phonation is normal. CN XI: Head turning and shoulder shrug are intact CN XII: Tongue is midline with normal movements and no atrophy.  MOTOR: There is no pronator drift of out-stretched arms. Muscle bulk and tone are normal. Muscle strength is normal.  REFLEXES: Reflexes are 2+ and symmetric at the biceps, triceps, knees, and ankles. Plantar responses are flexor.  SENSORY: Length dependent decreased to light touch, pinprick to ankle level, positional and vibratory sensation are intact in fingers and toes.  COORDINATION: Rapid alternating movements and fine finger movements are intact. There is no dysmetria on finger-to-nose and heel-knee-shin.    GAIT/STANCE: Posture is normal. Gait is steady with normal steps, base, arm swing, and turning. Heel and toe walking are normal. Tandem gait is normal.  Romberg is absent.      DIAGNOSTIC DATA (LABS, IMAGING, TESTING) - I reviewed patient records, labs, notes, testing and imaging myself where available.  Lab Results  Component Value Date   WBC 5.2 02/11/2016   HGB 8.6 (L) 02/11/2016   HCT 24.4 (L) 02/11/2016   MCV 92.1 02/11/2016   PLT 157 02/11/2016      Component Value Date/Time   NA 132 (L) 02/11/2016 0153   NA 128 (L) 01/30/2015 0809   K 3.0 (L) 02/11/2016 0153   CL 100 (L) 02/11/2016 0153   CO2 24 02/11/2016 0153   GLUCOSE 100 (H) 02/11/2016 0153   BUN 5 (L) 02/11/2016 0153   BUN 18 01/30/2015 0809   CREATININE 0.83 02/11/2016 0153   CALCIUM 8.4 (L) 02/11/2016 0153   PROT 6.0 (L) 01/30/2016 1920   PROT 6.3 01/30/2015 0809   ALBUMIN 4.0 01/30/2016 1920   ALBUMIN 4.2 01/30/2015 0809   AST 22 01/30/2016 1920   ALT 15 (L) 01/30/2016 1920   ALKPHOS 71 01/30/2016 1920   BILITOT 0.8 01/30/2016 1920   BILITOT 0.4  01/30/2015 0809   GFRNONAA >60 02/11/2016 0153   GFRAA >60 02/11/2016 0153   Lab Results  Component Value Date   CHOL 143 01/23/2016   HDL 54 01/23/2016   LDLCALC 77 01/23/2016   TRIG 60 01/23/2016   CHOLHDL 2.6 01/23/2016   Lab Results  Component Value Date   HGBA1C 5.5 01/23/2016   Lab Results  Component Value Date   VITAMINB12 2,613 (H) 01/23/2016   Lab Results  Component Value Date   TSH 2.672 02/11/2016      ASSESSMENT AND PLAN  76 y.o. year old male  Restless leg syndrome Peripheral neuropathy  Laboratory evaluation for potential etiology  Continue iron supplements with his most recent down deficiency anemia,  Continue Trileptal 150 mg twice a day  I have add on gabapentin 300 mg 3 tablets every night for his restless legs symptoms, may continue Requip, I also add on clonazepam as needed  Repeat EMG nerve conduction study  Marcial Pacas, M.D. Ph.D.  [facility] [place], [place] [address] Phone: [phone] Fax:      [phone]

## 2016-05-29 ENCOUNTER — Other Ambulatory Visit: Payer: Self-pay | Admitting: Neurology

## 2016-05-29 DIAGNOSIS — T378X5A Adverse effect of other specified systemic anti-infectives and antiparasitics, initial encounter: Secondary | ICD-10-CM

## 2016-05-29 DIAGNOSIS — Z77018 Contact with and (suspected) exposure to other hazardous metals: Secondary | ICD-10-CM

## 2016-05-29 LAB — C-REACTIVE PROTEIN: CRP: 1 mg/L (ref 0.0–4.9)

## 2016-05-29 LAB — FOLATE: Folate: >20 ng/mL (ref >3.0)

## 2016-05-29 LAB — ANA W/REFLEX: Anti Nuclear Antibody(ANA): NEGATIVE

## 2016-05-29 LAB — IRON AND TIBC
Iron Saturation: 35 % (ref 15–55)
Iron: 117 ug/dL (ref 38–169)
Total Iron Binding Capacity: 330 ug/dL (ref 250–450)
UIBC: 213 ug/dL (ref 111–343)

## 2016-05-29 LAB — FERRITIN: Ferritin: 157 ng/mL (ref 30–400)

## 2016-05-29 LAB — VITAMIN B12: Vitamin B-12: 1522 pg/mL — ABNORMAL HIGH (ref 232–1245)

## 2016-05-29 LAB — HOMOCYSTEINE: Homocysteine: 8.4 umol/L (ref 0.0–15.0)

## 2016-05-31 ENCOUNTER — Ambulatory Visit (INDEPENDENT_AMBULATORY_CARE_PROVIDER_SITE_OTHER): Payer: Medicare Other | Admitting: Neurology

## 2016-05-31 ENCOUNTER — Encounter (INDEPENDENT_AMBULATORY_CARE_PROVIDER_SITE_OTHER): Payer: Self-pay | Admitting: Neurology

## 2016-05-31 ENCOUNTER — Telehealth: Payer: Self-pay | Admitting: Neurology

## 2016-05-31 DIAGNOSIS — Z0289 Encounter for other administrative examinations: Secondary | ICD-10-CM

## 2016-05-31 DIAGNOSIS — G608 Other hereditary and idiopathic neuropathies: Secondary | ICD-10-CM | POA: Diagnosis not present

## 2016-05-31 DIAGNOSIS — G6289 Other specified polyneuropathies: Secondary | ICD-10-CM

## 2016-05-31 DIAGNOSIS — G2581 Restless legs syndrome: Secondary | ICD-10-CM

## 2016-05-31 DIAGNOSIS — D509 Iron deficiency anemia, unspecified: Secondary | ICD-10-CM

## 2016-05-31 NOTE — Telephone Encounter (Signed)
Noted, thank you his MRI is cancelled

## 2016-05-31 NOTE — Telephone Encounter (Signed)
Please cancel his MRI, is already been scheduled,

## 2016-05-31 NOTE — Procedures (Signed)
Full Name: Dustin [name] Gender: Male MRN #: [id number] Date of Birth: 07-Jun-1940    Visit Date: 05/31/2016 08:11 Age: 76 Years 25 Months Old Examining Physician: Marcial Pacas, MD  Referring Physician: Krista Blue, MD History: 76 year old gentleman with history of chronic bilateral feet paresthesia, recent worsening, difficulty sleeping, he denies significant weakness,  Summary of the test:  Nerve conduction study: Bilateral sural, superficial sensory responses were absent. Right medial sensory responses showed mildly prolonged peak latency with moderately decreased snap amplitude. Right ulnar sensory responses showed mildly prolonged peak latency, with the normal range snap amplitude.   Bilateral peroneal, right tibial motor responses were absent. Right median, ulnar motor responses showed normal distal latency, C map amplitude, mildly slow conduction velocity. There is no evidence of temporal dispersion, F wave latency of right median and ulnar motor responses were also mildly prolonged.  Electromyography: Selective needle examinations of right upper, lower extremity muscles, right cervical, lumbar sacral paraspinal muscles showed no significant abnormality.  Conclusion: This is an abnormal study. 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 There is no electrodiagnostic evidence of right cervical radiculopathy, or right lumbosacral radiculopathy.    ------------------------------- Marcial Pacas, M.D.  [facility] [place], [place] [address] Tel: [phone] Fax: [phone]        [facility]    Nerve / Sites Muscle Latency Ref. Amplitude Ref. Rel Amp Segments Distance Velocity Ref. Area    ms ms mV mV %  cm m/s m/s mVms  R Median - APB     Wrist APB 4.1 ?4.4 5.9 ?4.0 100 Wrist - APB 7   20.4     Upper arm APB 10.0  5.4  91 Upper arm - Wrist 26 44 ?49 19.3  R Ulnar - ADM     Wrist ADM 3.2 ?3.3 10.9  ?6.0 100 Wrist - ADM 7   40.3     B.Elbow ADM 8.1  10.0  91.9 B.Elbow - Wrist 22 45 ?49 43.0     A.Elbow ADM 9.7  9.7  97 A.Elbow - B.Elbow 8 48 ?49 42.8         A.Elbow - Wrist      R Peroneal - EDB     Ankle EDB NR ?6.5 NR ?2.0 NR Ankle - EDB 9   NR     Fib head EDB NR  NR  NR Fib head - Ankle   ?44 NR         Pop fossa - Ankle      L Peroneal - EDB     Ankle EDB NR ?6.5 NR ?2.0 NR Ankle - EDB 9   NR     Fib head EDB NR  NR  NR Fib head - Ankle   ?44 NR         Pop fossa - Ankle      R Tibial - AH     Ankle AH NR ?5.8 NR ?4.0 NR Ankle - AH 9   NR                 SNC    Nerve / Sites Rec. Site Peak Lat Ref.  Amp Ref. Segments Distance    ms ms V V  cm  R Radial - Anatomical snuff box (Forearm)     Forearm Wrist 3.1 ?2.9 11 ?15 Forearm - Wrist 10  R Sural - Ankle (Calf)     Calf Ankle NR ?  4.4 NR ?6 Calf - Ankle 14  L Sural - Ankle (Calf)     Calf Ankle NR ?4.4 NR ?6 Calf - Ankle 14  L Superficial peroneal - Ankle     Lat leg Ankle NR ?4.4 NR ?6 Lat leg - Ankle 14  R Superficial peroneal - Ankle     Lat leg Ankle NR ?4.4 NR ?6 Lat leg - Ankle 14  R Median - Orthodromic (Dig II, Mid palm)     Dig II Wrist 3.8 ?3.4 3 ?10 Dig II - Wrist 13  R Ulnar - Orthodromic, (Dig V, Mid palm)     Dig V Wrist 3.4 ?3.1 6 ?5 Dig V - Wrist 78                   F  Wave    Nerve F Lat Ref.   ms ms  R Median - APB 35.3 ?31.0  R Ulnar - ADM 35.6 ?32.0         EMG full       EMG Summary Table    Spontaneous MUAP Recruitment  Muscle IA Fib PSW Fasc Other Amp Dur. Poly Pattern  R. Tibialis anterior Normal None None None _______ Normal Normal Normal Normal  R. Gastrocnemius (Medial head) Normal None None None _______ Normal Normal Normal Normal  R. Peroneus longus Normal None None None _______ Normal Normal Normal Normal  R. Vastus lateralis Normal None None None _______ Normal Normal Normal Normal  R. Abductor hallucis Normal None None None _______ Normal Normal Normal Normal  R. First  dorsal interosseous Normal None None None _______ Normal Normal Normal Normal  R. Pronator teres Normal None None None _______ Normal Normal Normal Normal  R. Deltoid Normal None None None _______ Normal Normal Normal Normal  R. Biceps brachii Normal None None None _______ Normal Normal Normal Normal  R. Cervical paraspinals Normal None None None _______ Normal Normal Normal Normal  R. Lumbar paraspinals (mid) Normal None None None _______ Normal Normal Normal Normal  R. Lumbar paraspinals (low) Normal None None None _______ Normal Normal Normal Normal

## 2016-06-05 ENCOUNTER — Telehealth: Payer: Self-pay | Admitting: Neurology

## 2016-06-05 NOTE — Telephone Encounter (Signed)
Says he has been taking gabapentin 900mg  before bedtime.  He has been waking up with his RLS symptoms during the night then taking the following:  Clonazepam 0.5mg , Requip 1mg  and diazepam 87m (his wife's rx).  Dr. Krista Blue has reviewed his chart and he has been instructed to take gabapentin 900mg , clonazepam 0.5mg  and Requip 1mg  together before bedtime. He should not take any further doses of his wife's diazepam.  If his symptoms continue to be bothersome, then he will call our office.  He verbalized understanding and is agreeable to this plan.

## 2016-06-05 NOTE — Telephone Encounter (Signed)
Patient is calling to discuss RLS.

## 2016-06-10 ENCOUNTER — Other Ambulatory Visit: Payer: Medicare Other

## 2016-06-19 ENCOUNTER — Telehealth: Payer: Self-pay | Admitting: Neurology

## 2016-06-19 MED ORDER — ROPINIROLE HCL 1 MG PO TABS: 1.0000 mg | ORAL_TABLET | Freq: Every day | ORAL | 1 refills | Status: DC

## 2016-06-19 NOTE — Telephone Encounter (Signed)
Pt said another Dr put him on rOPINIRole (REQUIP) 1 MG tablet but that Dr Krista Blue said he should continue to take it.  Pt said he has not reached out to the perscriber of the Rx but since Dr Krista Blue said he should continue on it he wants to know if she will fill it.  Please call

## 2016-06-19 NOTE — Addendum Note (Signed)
Addended by: Noberto Retort C on: 06/19/2016 11:37 AM   Modules accepted: Orders

## 2016-06-19 NOTE — Telephone Encounter (Signed)
Per vo by Dr. Krista Blue, ok to continue Requip 1mg  at bedtime.  Rx sent to pharmacy.  Pt aware.

## 2016-07-05 DIAGNOSIS — D508 Other iron deficiency anemias: Secondary | ICD-10-CM | POA: Diagnosis not present

## 2016-07-05 DIAGNOSIS — G2581 Restless legs syndrome: Secondary | ICD-10-CM | POA: Diagnosis not present

## 2016-07-05 DIAGNOSIS — G4700 Insomnia, unspecified: Secondary | ICD-10-CM | POA: Diagnosis not present

## 2016-07-22 ENCOUNTER — Telehealth: Payer: Self-pay | Admitting: *Deleted

## 2016-07-22 ENCOUNTER — Encounter: Payer: Self-pay | Admitting: Neurology

## 2016-07-22 ENCOUNTER — Ambulatory Visit (INDEPENDENT_AMBULATORY_CARE_PROVIDER_SITE_OTHER): Payer: Medicare Other | Admitting: Neurology

## 2016-07-22 ENCOUNTER — Encounter (INDEPENDENT_AMBULATORY_CARE_PROVIDER_SITE_OTHER): Payer: Self-pay

## 2016-07-22 VITALS — BP 127/76 | HR 76 | Ht 74.0 in | Wt 222.0 lb

## 2016-07-22 DIAGNOSIS — F32A Depression, unspecified: Secondary | ICD-10-CM

## 2016-07-22 DIAGNOSIS — F329 Major depressive disorder, single episode, unspecified: Secondary | ICD-10-CM | POA: Diagnosis not present

## 2016-07-22 DIAGNOSIS — G2581 Restless legs syndrome: Secondary | ICD-10-CM

## 2016-07-22 DIAGNOSIS — M79604 Pain in right leg: Secondary | ICD-10-CM | POA: Diagnosis not present

## 2016-07-22 MED ORDER — OXYCODONE HCL ER 10 MG PO T12A: 10.0000 mg | EXTENDED_RELEASE_TABLET | Freq: Every day | ORAL | 0 refills | Status: DC

## 2016-07-22 MED ORDER — CLONAZEPAM 0.5 MG PO TABS: 0.5000 mg | ORAL_TABLET | Freq: Every evening | ORAL | Status: DC | PRN

## 2016-07-22 MED ORDER — OXYCODONE HCL ER 10 MG PO T12A: 10.0000 mg | EXTENDED_RELEASE_TABLET | Freq: Two times a day (BID) | ORAL | 0 refills | Status: DC

## 2016-07-22 MED ORDER — GABAPENTIN 300 MG PO CAPS: 300.0000 mg | ORAL_CAPSULE | Freq: Every day | ORAL | Status: DC

## 2016-07-22 NOTE — Progress Notes (Signed)
[facility]  PATIENT: Dustin [name] DOB: [birth year]  HISTORY OF PRESENT ILLNESS Patient had long-standing history of mild peripheral neuropathy, has been taking Trileptal 150 mg twice a day for bilateral feet paresthesia, also taking Metanx, had a history of depression, on polypharmacy treatment, seroquel 300 mg at nighttime.  He was referred urgently back to our clinic by his primary care physician Dr. Dierdre Forth for worsening difficulty sleeping, urge to move his leg,  Most recent office visit with Dr. Mariea Clonts was on May 26 2016, I reviewed and summarized referring note, he had a history of esophageal reflux, esophagitis,  He complains of worsening restless leg symptoms, difficulty holding his leg still when relaxed, trying to go to sleep, he reported 48 hours without sleeping because of the leg discomfort, urge to move  He continues to complains bilateral feet numbness tingling needle prick, at the plantar surface, below the ankle,  He denies bowel and bladder incontinence, denies low back pain,   I reviewed the laboratory evaluation in December 2017, INR was 1.22, normal TSH, free T4, CBC showed anemia hemoglobin of 8.6, HCT of 24, BMP showed glucose of 100, calcium of 8.4, negative RPR,  He was diagnosed with iron deficiency anemia, has been treated with iron supplements since March 2018,  He tried gabapentin and Lyrica in the past, did not help, Lyrica made him feel funny,  I reviewed CT head on February 09 2016, no acute abnormality, mild generalized atrophy, supratentorium small vessel disease,  He was started on Requip 1 mg recently, only taking 0.25 milligrams every night, no significant improvement  He denies significant low back pain, no bowel and bladder incontinence, no bilateral fingertips paresthesia  UPDATE July 22 2016: Reviewed laboratory evaluation in April 2018, normal and negative ANA, C-reactive protein, homocystine level, folic acid, iron  level, ferritin was 157, thyroid function test, B12,  EMG nerve conduction study on May 31 2016 showed evidence of length dependent mild axonal sensorimotor polyneuropathy, there is no evidence of right lumbosacral radiculopathy.  Today he complains of intermittent sharp radiating pain at right lower extremity, despite polypharmacy at night time including gabapentin 300 mg 3 tablets at bedtime, seroquel 600 mg at bedtime,  taking Requip 1mg , clonazepam 0.5mg , melatonin    he also complains of new onset right lower extremity pain radiating along the right calf when he first bear weight, improved after few seconds, tenderness of the right calf upon deep palpitation    REVIEW  that overallOF SYSTEMS: Full 14 system review of systems performed and notable only for those listed, all others are neg:   ALLERGIES: No Known Allergies  HOME MEDICATIONS: Outpatient Medications Prior to Visit  Medication Sig Dispense Refill  . clonazePAM (KLONOPIN) 0.5 MG tablet Take 1 tablet (0.5 mg total) by mouth 2 (two) times daily as needed for anxiety. 30 tablet 5  . ferrous fumarate-iron polysaccharide complex (TANDEM) 162-115.2 MG CAPS capsule Take 1 capsule by mouth daily with breakfast. 30 capsule 11  . finasteride (PROSCAR) 5 MG tablet Take 5 mg by mouth daily.      Marland Kitchen gabapentin (NEURONTIN) 300 MG capsule Take 3 capsules (900 mg total) by mouth at bedtime. 90 capsule 11  . L-Methylfolate-Algae-B12-B6 (METANX) 3-90.314-2-35 MG CAPS Take one capsule daily. 90 capsule 3  . Multiple Vitamins-Minerals (MULTIVITAMIN WITH MINERALS) tablet Take 1 tablet by mouth daily.    . OXcarbazepine (TRILEPTAL) 150 MG tablet Take 1 tablet (150 mg total) by mouth 2 (two) times daily.  180 tablet 3  . pantoprazole (PROTONIX) 40 MG tablet Take 40 mg by mouth daily.      . QUEtiapine (SEROQUEL) 300 MG tablet Take 600 mg by mouth at bedtime. Reported on 05/10/2015    . rOPINIRole (REQUIP) 1 MG tablet Take 1 tablet (1 mg total) by  mouth at bedtime. 90 tablet 1  . Ferrous Sulfate (IRON) 325 (65 Fe) MG TABS Take by mouth. One daily    . polyethylene glycol (MIRALAX / GLYCOLAX) packet Take 17 g by mouth daily as needed for mild constipation. 30 each 0   No facility-administered medications prior to visit.     PAST MEDICAL HISTORY: Past Medical History:  Diagnosis Date  . BPH (benign prostatic hyperplasia)   . Chronic combined systolic and diastolic congestive heart failure ([place])   . GERD (gastroesophageal reflux disease)    protonix for control  . Insomnia   . Neck pain   . Neuropathy     PAST SURGICAL HISTORY: Past Surgical History:  Procedure Laterality Date  . ANTERIOR CERVICAL DECOMPRESSION/DISCECTOMY FUSION 4 LEVELS N/A 10/27/2012   Procedure: Cervical Three-Four Cervical Four-Five Cervical Five-Six Cervical Six-Seven  Anterior cervical decompression/diskectomy/fusion;  Surgeon: Erline Levine, MD;  Location: [place];  Service: Neurosurgery;  Laterality: N/A;  Cervical Three-Four Cervical Four-Five Cervical Five-Six Cervical Six-Seven  Anterior cervical decompression/diskectomy/fusion  . SHOULDER ARTHROSCOPY  2010   rt shoulder  . SHOULDER ARTHROSCOPY  02/15/2011   Procedure: ARTHROSCOPY SHOULDER;  Surgeon: Cynda Familia;  Location: [place];  Service: Orthopedics;  Laterality: Right;  Debridement  . TONSILLECTOMY      FAMILY HISTORY: Family History  Problem Relation Age of Onset  . Emphysema Mother   . Cirrhosis Father   . Stroke Brother     SOCIAL HISTORY: Social History   Social History  . Marital status: Married    Spouse name: [name]  . Number of children: 2  . Years of education: college   Occupational History  . retired    Social History Main Topics  . Smoking status: Former Smoker    Packs/day: 1.50    Quit date: 01/27/1990  . Smokeless tobacco: Never Used  . Alcohol use No  . Drug use: No  . Sexual activity: Not on file   Other Topics Concern  .  Not on file   Social History Narrative   [name]   Pt is retired   Education college    Pt is left handed   Pt consumes 2 cups of coffee daily     PHYSICAL EXAM  Vitals:   07/22/16 0903  BP: 127/76  Pulse: 76  Weight: 222 lb (100.7 kg)  Height: 6\' 2"  (1.88 m)   Body mass index is 28.5 kg/m.   PHYSICAL EXAMNIATION:  Gen: NAD, conversant, well nourised, obese, well groomed                     Cardiovascular: Regular rate rhythm, no peripheral edema, warm, nontender. Eyes: Conjunctivae clear without exudates or hemorrhage Neck: Supple, no carotid bruits. Pulmonary: Clear to auscultation bilaterally   NEUROLOGICAL EXAM:  MENTAL STATUS: Speech:    Speech is normal; fluent and spontaneous with normal comprehension.  Cognition:     Orientation to time, place and person     Normal recent and remote memory     Normal Attention span and concentration     Normal Language, naming, repeating,spontaneous speech  Fund of knowledge   CRANIAL NERVES: CN II: Visual fields are full to confrontation. Fundoscopic exam is normal with sharp discs and no vascular changes. Pupils are round equal and briskly reactive to light. CN III, IV, VI: extraocular movement are normal. No ptosis. CN V: Facial sensation is intact to pinprick in all 3 divisions bilaterally. Corneal responses are intact.  CN VII: Face is symmetric with normal eye closure and smile. CN VIII: Hearing is normal to rubbing fingers CN IX, X: Palate elevates symmetrically. Phonation is normal. CN XI: Head turning and shoulder shrug are intact CN XII: Tongue is midline with normal movements and no atrophy.  MOTOR: There is no pronator drift of out-stretched arms. Muscle bulk and tone are normal. Muscle strength is normal.  REFLEXES: Reflexes are 2+ and symmetric at the biceps, triceps, knees, and ankles. Plantar responses are flexor.  SENSORY: Length dependent decreased to light touch,  pinprick to ankle level, positional and vibratory sensation are intact in fingers and toes.  COORDINATION: Rapid alternating movements and fine finger movements are intact. There is no dysmetria on finger-to-nose and heel-knee-shin.    GAIT/STANCE: He has right leg atalgic gait especially when first bearing weight Romberg is absent.      DIAGNOSTIC DATA (LABS, IMAGING, TESTING) - I reviewed patient records, labs, notes, testing and imaging myself where available.  Lab Results  Component Value Date   WBC 5.2 02/11/2016   HGB 8.6 (L) 02/11/2016   HCT 24.4 (L) 02/11/2016   MCV 92.1 02/11/2016   PLT 157 02/11/2016      Component Value Date/Time   NA 132 (L) 02/11/2016 0153   NA 128 (L) 01/30/2015 0809   K 3.0 (L) 02/11/2016 0153   CL 100 (L) 02/11/2016 0153   CO2 24 02/11/2016 0153   GLUCOSE 100 (H) 02/11/2016 0153   BUN 5 (L) 02/11/2016 0153   BUN 18 01/30/2015 0809   CREATININE 0.83 02/11/2016 0153   CALCIUM 8.4 (L) 02/11/2016 0153   PROT 6.0 (L) 01/30/2016 1920   PROT 6.3 01/30/2015 0809   ALBUMIN 4.0 01/30/2016 1920   ALBUMIN 4.2 01/30/2015 0809   AST 22 01/30/2016 1920   ALT 15 (L) 01/30/2016 1920   ALKPHOS 71 01/30/2016 1920   BILITOT 0.8 01/30/2016 1920   BILITOT 0.4 01/30/2015 0809   GFRNONAA >60 02/11/2016 0153   GFRAA >60 02/11/2016 0153   Lab Results  Component Value Date   CHOL 143 01/23/2016   HDL 54 01/23/2016   LDLCALC 77 01/23/2016   TRIG 60 01/23/2016   CHOLHDL 2.6 01/23/2016   Lab Results  Component Value Date   HGBA1C 5.5 01/23/2016   Lab Results  Component Value Date   VITAMINB12 1,522 (H) 05/28/2016   Lab Results  Component Value Date   TSH 2.672 02/11/2016      ASSESSMENT AND PLAN  76 y.o. year old male  Restless leg syndrome Peripheral neuropathy  No treatable etiology found  Polypharmacy treatment   I have made the changes in his medication, he will take seroquel 600 mg every night, Trileptal 150 mg every night,  melatonin, gabapentin 300 mg, and add on oxycodone ER 10 mg  every night for his restless leg, Clonazepam 0.5mg  qhs prn.  Will see NP Megan in 4 weeks to reevaluate his medications,  New onset right calf radiating pain, tenderness upon deep palpitation  Need to rule out right lower extremity DVT, right lumbosacral radiculopathy   MRI lumbar, right lower extremity Doppler study  Marcial Pacas, M.D. Ph.D.  [facility] [place], [place] [address] Phone: [phone] Fax:      [phone]

## 2016-07-22 NOTE — Telephone Encounter (Signed)
Patient's prescriptions discussed with him.  He has a pending appt in July with Dr. Krista Blue.

## 2016-07-22 NOTE — Telephone Encounter (Signed)
Left message to discuss his prescriptions from his visit this morning.

## 2016-07-22 NOTE — Telephone Encounter (Signed)
Patient called office returning RN's call.  Please call °

## 2016-08-06 ENCOUNTER — Ambulatory Visit
Admission: RE | Admit: 2016-08-06 | Discharge: 2016-08-06 | Disposition: A | Discharge status: Home / Self Care | Payer: Medicare Other | Source: Ambulatory Visit | Attending: Neurology | Admitting: Neurology

## 2016-08-06 DIAGNOSIS — M48061 Spinal stenosis, lumbar region without neurogenic claudication: Secondary | ICD-10-CM | POA: Diagnosis not present

## 2016-08-06 DIAGNOSIS — G2581 Restless legs syndrome: Secondary | ICD-10-CM | POA: Diagnosis not present

## 2016-08-06 DIAGNOSIS — M79604 Pain in right leg: Secondary | ICD-10-CM | POA: Diagnosis not present

## 2016-08-06 DIAGNOSIS — F32A Depression, unspecified: Secondary | ICD-10-CM

## 2016-08-06 DIAGNOSIS — Z01818 Encounter for other preprocedural examination: Secondary | ICD-10-CM | POA: Diagnosis not present

## 2016-08-06 DIAGNOSIS — Z77018 Contact with and (suspected) exposure to other hazardous metals: Secondary | ICD-10-CM

## 2016-08-06 DIAGNOSIS — F329 Major depressive disorder, single episode, unspecified: Secondary | ICD-10-CM

## 2016-08-07 ENCOUNTER — Telehealth: Payer: Self-pay | Admitting: Neurology

## 2016-08-07 NOTE — Telephone Encounter (Signed)
I called the patient. The MRI does show some degenerative changes at several levels, worse at the L5-S1 level with possible impingement of the L5 nerve roots bilaterally.  The note from Dr. Krista Blue indicates that the patient was not having any back pain or pain down the legs. Not sure if any further treatment of this would be offered.  MRI lumbar 08/07/16:  IMPRESSION:  This MRI of the lumbar spine shows the following: 1.   At L2-L3, there is 2 mm of retrolisthesis associated with moderately severe facet hypertrophy of flavum hypertrophy. There is mild bilateral foraminal narrowing and mild left lateral recess stenosis but no nerve root compression. 2.   At L3-L4, there is borderline spinal stenosis due to disc protrusion and facet hypertrophy. There is moderate bilateral lateral recess stenosis. There does not appear to be nerve root compression. 3.   At L4-L5, there is disc protrusion, endplate spurring and facet hypertrophy. There is moderate right foraminal narrowing but there does not appear to be any nerve root compression. 4.   At L5-S1(L6), there is disc protrusion, endplate spurring and severe facet hypertrophy causing moderately severe bilateral foraminal narrowing that could lead to compression of either of the L5 nerve roots.

## 2016-08-16 ENCOUNTER — Telehealth: Payer: Self-pay | Admitting: Neurology

## 2016-08-16 NOTE — Telephone Encounter (Signed)
Pt calling for refill of oxyCODONE (OXYCONTIN) 10 mg 12 hr tablet

## 2016-08-19 MED ORDER — OXYCODONE HCL ER 10 MG PO T12A: 10.0000 mg | EXTENDED_RELEASE_TABLET | Freq: Every day | ORAL | 0 refills | Status: DC

## 2016-08-19 NOTE — Telephone Encounter (Signed)
Rx printed, signed and placed up front for pick up. 

## 2016-08-19 NOTE — Addendum Note (Signed)
Addended by: Noberto Retort C on: 08/19/2016 09:03 AM   Modules accepted: Orders

## 2016-08-27 DIAGNOSIS — M4802 Spinal stenosis, cervical region: Secondary | ICD-10-CM | POA: Diagnosis not present

## 2016-08-27 DIAGNOSIS — Z6829 Body mass index (BMI) 29.0-29.9, adult: Secondary | ICD-10-CM | POA: Diagnosis not present

## 2016-08-27 DIAGNOSIS — M47817 Spondylosis without myelopathy or radiculopathy, lumbosacral region: Secondary | ICD-10-CM | POA: Diagnosis not present

## 2016-08-27 DIAGNOSIS — M47816 Spondylosis without myelopathy or radiculopathy, lumbar region: Secondary | ICD-10-CM | POA: Diagnosis not present

## 2016-08-27 DIAGNOSIS — R030 Elevated blood-pressure reading, without diagnosis of hypertension: Secondary | ICD-10-CM | POA: Diagnosis not present

## 2016-09-18 ENCOUNTER — Encounter (INDEPENDENT_AMBULATORY_CARE_PROVIDER_SITE_OTHER): Payer: Self-pay

## 2016-09-18 ENCOUNTER — Ambulatory Visit (INDEPENDENT_AMBULATORY_CARE_PROVIDER_SITE_OTHER): Payer: Medicare Other | Admitting: Neurology

## 2016-09-18 ENCOUNTER — Encounter: Payer: Self-pay | Admitting: Neurology

## 2016-09-18 VITALS — BP 126/73 | HR 82 | Ht 74.0 in | Wt 220.0 lb

## 2016-09-18 DIAGNOSIS — M5416 Radiculopathy, lumbar region: Secondary | ICD-10-CM | POA: Diagnosis not present

## 2016-09-18 DIAGNOSIS — G608 Other hereditary and idiopathic neuropathies: Secondary | ICD-10-CM | POA: Diagnosis not present

## 2016-09-18 DIAGNOSIS — G2581 Restless legs syndrome: Secondary | ICD-10-CM

## 2016-09-18 DIAGNOSIS — G6289 Other specified polyneuropathies: Secondary | ICD-10-CM

## 2016-09-18 NOTE — Progress Notes (Signed)
[facility]  PATIENT: Dustin [name] DOB: 05-24-1940  HISTORY OF PRESENT ILLNESS Patient had long-standing history of mild peripheral neuropathy, has been taking Trileptal 150 mg twice a day for bilateral feet paresthesia, also taking Metanx, had a history of depression, on polypharmacy treatment, seroquel 300 mg at nighttime.  He was referred urgently back to our clinic by his primary care physician Dr. Dierdre Forth for worsening difficulty sleeping, urge to move his leg,  Most recent office visit with Dr. Mariea Clonts was on May 26 2016, I reviewed and summarized referring note, he had a history of esophageal reflux, esophagitis,  He complains of worsening restless leg symptoms, difficulty holding his leg still when relaxed, trying to go to sleep, he reported 48 hours without sleeping because of the leg discomfort, urge to move  He continues to complains bilateral feet numbness tingling needle prick, at the plantar surface, below the ankle,  He denies bowel and bladder incontinence, denies low back pain,   I reviewed the laboratory evaluation in December 2017, INR was 1.22, normal TSH, free T4, CBC showed anemia hemoglobin of 8.6, HCT of 24, BMP showed glucose of 100, calcium of 8.4, negative RPR,  He was diagnosed with iron deficiency anemia, has been treated with iron supplements since March 2018,  He tried gabapentin and Lyrica in the past, did not help, Lyrica made him feel funny,  I reviewed CT head on February 09 2016, no acute abnormality, mild generalized atrophy, supratentorium small vessel disease,  He was started on Requip 1 mg recently, only taking 0.25 milligrams every night, no significant improvement  He denies significant low back pain, no bowel and bladder incontinence, no bilateral fingertips paresthesia  UPDATE July 22 2016: Reviewed laboratory evaluation in April 2018, normal and negative ANA, C-reactive protein, homocystine level, folic acid, iron  level, ferritin was 157, thyroid function test, B12,  EMG nerve conduction study on May 31 2016 showed evidence of length dependent mild axonal sensorimotor polyneuropathy, there is no evidence of right lumbosacral radiculopathy.  Today he complains of intermittent sharp radiating pain at right lower extremity, despite polypharmacy at night time including gabapentin 300 mg 3 tablets at bedtime, seroquel 600 mg at bedtime,  taking Requip 1mg , clonazepam 0.5mg , melatonin    he also complains of new onset right lower extremity pain radiating along the right calf when he first bear weight, improved after few seconds, tenderness of the right calf upon deep palpitation   UPDATE September 18 2016: He reported inconsistent response to oxycodone ER 10 mg every night, despite polypharmacy of seroquel 600 mg, oxycodone ER 10 mg, Requip 1 mg, clonazepam 0.5 milligrams, melatonin, gabapentin up to 900 mg every night, sometimes he still have difficulty sleeping, uncontrollable right leg jumping movement,  We have personally reviewed MRI lumbar in June 2018, multilevel degenerative disc disease, with L5-S1 moderate to severe foraminal narrowing,  He is going to have epidural injection by pain management Dr. Clydell Hakim soon, he does complains of right hip pain, radiating pain to right calf, no bowel and bladder incontinence  REVIEW  that overallOF SYSTEMS: Full 14 system review of systems performed and notable only for those listed, all others are neg:  Gait abnormality ALLERGIES: No Known Allergies  HOME MEDICATIONS: Outpatient Medications Prior to Visit  Medication Sig Dispense Refill  . clonazePAM (KLONOPIN) 0.5 MG tablet Take 1 tablet (0.5 mg total) by mouth at bedtime as needed (restless leg).    . ferrous fumarate-iron polysaccharide complex (TANDEM)  162-115.2 MG CAPS capsule Take 1 capsule by mouth daily with breakfast. 30 capsule 11  . finasteride (PROSCAR) 5 MG tablet Take 5 mg by mouth daily.        Marland Kitchen gabapentin (NEURONTIN) 300 MG capsule Take 1 capsule (300 mg total) by mouth at bedtime.    Marland Kitchen L-Methylfolate-Algae-B12-B6 (METANX) 3-90.314-2-35 MG CAPS Take one capsule daily. 90 capsule 3  . Multiple Vitamins-Minerals (MULTIVITAMIN WITH MINERALS) tablet Take 1 tablet by mouth daily.    . OXcarbazepine (TRILEPTAL) 150 MG tablet Take 1 tablet (150 mg total) by mouth 2 (two) times daily. 180 tablet 3  . pantoprazole (PROTONIX) 40 MG tablet Take 40 mg by mouth daily.      . QUEtiapine (SEROQUEL) 300 MG tablet Take 600 mg by mouth at bedtime. Reported on 05/10/2015    . rOPINIRole (REQUIP) 1 MG tablet Take 1 tablet (1 mg total) by mouth at bedtime. 90 tablet 1  . oxyCODONE (OXYCONTIN) 10 mg 12 hr tablet Take 1 tablet (10 mg total) by mouth at bedtime. 30 tablet 0   No facility-administered medications prior to visit.     PAST MEDICAL HISTORY: Past Medical History:  Diagnosis Date  . BPH (benign prostatic hyperplasia)   . Chronic combined systolic and diastolic congestive heart failure ([place])   . GERD (gastroesophageal reflux disease)    protonix for control  . Insomnia   . Neck pain   . Neuropathy     PAST SURGICAL HISTORY: Past Surgical History:  Procedure Laterality Date  . ANTERIOR CERVICAL DECOMPRESSION/DISCECTOMY FUSION 4 LEVELS N/A 10/27/2012   Procedure: Cervical Three-Four Cervical Four-Five Cervical Five-Six Cervical Six-Seven  Anterior cervical decompression/diskectomy/fusion;  Surgeon: Erline Levine, MD;  Location: [facility];  Service: Neurosurgery;  Laterality: N/A;  Cervical Three-Four Cervical Four-Five Cervical Five-Six Cervical Six-Seven  Anterior cervical decompression/diskectomy/fusion  . SHOULDER ARTHROSCOPY  2010   rt shoulder  . SHOULDER ARTHROSCOPY  02/15/2011   Procedure: ARTHROSCOPY SHOULDER;  Surgeon: Cynda Familia;  Location: [facility];  Service: Orthopedics;  Laterality: Right;  Debridement  . TONSILLECTOMY      FAMILY  HISTORY: Family History  Problem Relation Age of Onset  . Emphysema Mother   . Cirrhosis Father   . Stroke Brother     SOCIAL HISTORY: Social History   Social History  . Marital status: Married    Spouse name: [name]  . Number of children: 2  . Years of education: college   Occupational History  . retired    Social History Main Topics  . Smoking status: Former Smoker    Packs/day: 1.50    Quit date: 01/27/1990  . Smokeless tobacco: Never Used  . Alcohol use No  . Drug use: No  . Sexual activity: Not on file   Other Topics Concern  . Not on file   Social History Narrative   [name]   Pt is retired   Education college    Pt is left handed   Pt consumes 2 cups of coffee daily     PHYSICAL EXAM  Vitals:   09/18/16 0759  BP: 126/73  Pulse: 82  Weight: 220 lb (99.8 kg)  Height: 6\' 2"  (1.88 m)   Body mass index is 28.25 kg/m.   PHYSICAL EXAMNIATION:  Gen: NAD, conversant, well nourised, obese, well groomed                     Cardiovascular: Regular rate rhythm,  no peripheral edema, warm, nontender. Eyes: Conjunctivae clear without exudates or hemorrhage Neck: Supple, no carotid bruits. Pulmonary: Clear to auscultation bilaterally   NEUROLOGICAL EXAM:  MENTAL STATUS: Speech:    Speech is normal; fluent and spontaneous with normal comprehension.  Cognition:     Orientation to time, place and person     Normal recent and remote memory     Normal Attention span and concentration     Normal Language, naming, repeating,spontaneous speech     Fund of knowledge   CRANIAL NERVES: CN II: Visual fields are full to confrontation. Fundoscopic exam is normal with sharp discs and no vascular changes. Pupils are round equal and briskly reactive to light. CN III, IV, VI: extraocular movement are normal. No ptosis. CN V: Facial sensation is intact to pinprick in all 3 divisions bilaterally. Corneal responses are intact.  CN VII: Face is  symmetric with normal eye closure and smile. CN VIII: Hearing is normal to rubbing fingers CN IX, X: Palate elevates symmetrically. Phonation is normal. CN XI: Head turning and shoulder shrug are intact CN XII: Tongue is midline with normal movements and no atrophy.  MOTOR: There is no pronator drift of out-stretched arms. Muscle bulk and tone are normal. Muscle strength is normal.  REFLEXES: Reflexes are 2+ and symmetric at the biceps, triceps, knees, and ankles. Plantar responses are flexor.  SENSORY: Length dependent decreased to light touch, pinprick to ankle level, positional and vibratory sensation are intact in fingers and toes.  COORDINATION: Rapid alternating movements and fine finger movements are intact. There is no dysmetria on finger-to-nose and heel-knee-shin.    GAIT/STANCE: He has right leg atalgic gait especially when first bearing weight, mildly antalgic gait  Romberg is absent.      DIAGNOSTIC DATA (LABS, IMAGING, TESTING) - I reviewed patient records, labs, notes, testing and imaging myself where available.  Lab Results  Component Value Date   WBC 5.2 02/11/2016   HGB 8.6 (L) 02/11/2016   HCT 24.4 (L) 02/11/2016   MCV 92.1 02/11/2016   PLT 157 02/11/2016      Component Value Date/Time   NA 132 (L) 02/11/2016 0153   NA 128 (L) 01/30/2015 0809   K 3.0 (L) 02/11/2016 0153   CL 100 (L) 02/11/2016 0153   CO2 24 02/11/2016 0153   GLUCOSE 100 (H) 02/11/2016 0153   BUN 5 (L) 02/11/2016 0153   BUN 18 01/30/2015 0809   CREATININE 0.83 02/11/2016 0153   CALCIUM 8.4 (L) 02/11/2016 0153   PROT 6.0 (L) 01/30/2016 1920   PROT 6.3 01/30/2015 0809   ALBUMIN 4.0 01/30/2016 1920   ALBUMIN 4.2 01/30/2015 0809   AST 22 01/30/2016 1920   ALT 15 (L) 01/30/2016 1920   ALKPHOS 71 01/30/2016 1920   BILITOT 0.8 01/30/2016 1920   BILITOT 0.4 01/30/2015 0809   GFRNONAA >60 02/11/2016 0153   GFRAA >60 02/11/2016 0153   Lab Results  Component Value Date   CHOL 143  01/23/2016   HDL 54 01/23/2016   LDLCALC 77 01/23/2016   TRIG 60 01/23/2016   CHOLHDL 2.6 01/23/2016   Lab Results  Component Value Date   HGBA1C 5.5 01/23/2016   Lab Results  Component Value Date   VITAMINB12 1,522 (H) 05/28/2016   Lab Results  Component Value Date   TSH 2.672 02/11/2016      ASSESSMENT AND PLAN  76 y.o. year old male  Restless leg syndrome  Right leg symptoms is much worse than the left  side, correlate with his MRI lumbar findings, multilevel degenerative disc disease especially L5-S1, moderate severe bilateral foraminal narrowing right worse than left.   Peripheral neuropathy  No treatable etiology found   Polypharmacy treatment   He is to continue Requip 1 mg every night, along with his seroquel 600 mg, melatonin, clonazepam 0.5 milligram as needed, gabapentin up to 900 mg  Right low back pain, radiating pain to right leg,  Continue with pain management Dr. Maryjean Ka, I will no longer prescribe him chronic narcotic  Marcial Pacas, M.D. Ph.D.  [facility] [place], [place] [address] Phone: [phone] Fax:      [phone]

## 2016-09-19 DIAGNOSIS — M5417 Radiculopathy, lumbosacral region: Secondary | ICD-10-CM | POA: Diagnosis not present

## 2016-09-24 ENCOUNTER — Ambulatory Visit: Payer: Medicare Other | Admitting: Neurology

## 2016-10-10 DIAGNOSIS — M5417 Radiculopathy, lumbosacral region: Secondary | ICD-10-CM | POA: Diagnosis not present

## 2016-10-10 DIAGNOSIS — R030 Elevated blood-pressure reading, without diagnosis of hypertension: Secondary | ICD-10-CM | POA: Diagnosis not present

## 2016-10-10 DIAGNOSIS — M47817 Spondylosis without myelopathy or radiculopathy, lumbosacral region: Secondary | ICD-10-CM | POA: Diagnosis not present

## 2016-10-10 DIAGNOSIS — M4802 Spinal stenosis, cervical region: Secondary | ICD-10-CM | POA: Diagnosis not present

## 2016-10-10 DIAGNOSIS — Z6829 Body mass index (BMI) 29.0-29.9, adult: Secondary | ICD-10-CM | POA: Diagnosis not present

## 2016-10-10 DIAGNOSIS — M47816 Spondylosis without myelopathy or radiculopathy, lumbar region: Secondary | ICD-10-CM | POA: Diagnosis not present

## 2016-10-22 ENCOUNTER — Ambulatory Visit: Payer: Medicare Other | Admitting: Adult Health

## 2016-10-25 DIAGNOSIS — Z23 Encounter for immunization: Secondary | ICD-10-CM | POA: Diagnosis not present

## 2016-11-02 ENCOUNTER — Other Ambulatory Visit: Payer: Self-pay | Admitting: Neurology

## 2016-11-05 DIAGNOSIS — M170 Bilateral primary osteoarthritis of knee: Secondary | ICD-10-CM | POA: Diagnosis not present

## 2016-11-05 DIAGNOSIS — M25571 Pain in right ankle and joints of right foot: Secondary | ICD-10-CM | POA: Diagnosis not present

## 2016-11-18 DIAGNOSIS — Z85828 Personal history of other malignant neoplasm of skin: Secondary | ICD-10-CM | POA: Diagnosis not present

## 2016-11-18 DIAGNOSIS — L570 Actinic keratosis: Secondary | ICD-10-CM | POA: Diagnosis not present

## 2016-11-18 DIAGNOSIS — L821 Other seborrheic keratosis: Secondary | ICD-10-CM | POA: Diagnosis not present

## 2016-11-18 DIAGNOSIS — L814 Other melanin hyperpigmentation: Secondary | ICD-10-CM | POA: Diagnosis not present

## 2016-11-18 DIAGNOSIS — D1801 Hemangioma of skin and subcutaneous tissue: Secondary | ICD-10-CM | POA: Diagnosis not present

## 2016-12-03 ENCOUNTER — Other Ambulatory Visit: Payer: Self-pay | Admitting: Physician Assistant

## 2016-12-03 DIAGNOSIS — M170 Bilateral primary osteoarthritis of knee: Secondary | ICD-10-CM | POA: Diagnosis not present

## 2016-12-13 ENCOUNTER — Ambulatory Visit
Admission: RE | Admit: 2016-12-13 | Discharge: 2016-12-13 | Disposition: A | Discharge status: Home / Self Care | Payer: Medicare Other | Source: Ambulatory Visit | Attending: Physician Assistant | Admitting: Physician Assistant

## 2016-12-13 DIAGNOSIS — M170 Bilateral primary osteoarthritis of knee: Secondary | ICD-10-CM

## 2016-12-13 DIAGNOSIS — M25562 Pain in left knee: Secondary | ICD-10-CM | POA: Diagnosis not present

## 2016-12-18 ENCOUNTER — Other Ambulatory Visit: Payer: Self-pay | Admitting: Neurology

## 2016-12-20 DIAGNOSIS — M25571 Pain in right ankle and joints of right foot: Secondary | ICD-10-CM | POA: Diagnosis not present

## 2016-12-27 DIAGNOSIS — M25571 Pain in right ankle and joints of right foot: Secondary | ICD-10-CM | POA: Diagnosis not present

## 2017-01-01 DIAGNOSIS — M1712 Unilateral primary osteoarthritis, left knee: Secondary | ICD-10-CM | POA: Diagnosis not present

## 2017-01-03 DIAGNOSIS — M25571 Pain in right ankle and joints of right foot: Secondary | ICD-10-CM | POA: Diagnosis not present

## 2017-01-07 DIAGNOSIS — M4802 Spinal stenosis, cervical region: Secondary | ICD-10-CM | POA: Diagnosis not present

## 2017-01-07 DIAGNOSIS — M5417 Radiculopathy, lumbosacral region: Secondary | ICD-10-CM | POA: Diagnosis not present

## 2017-01-07 DIAGNOSIS — Z6829 Body mass index (BMI) 29.0-29.9, adult: Secondary | ICD-10-CM | POA: Diagnosis not present

## 2017-01-07 DIAGNOSIS — M47816 Spondylosis without myelopathy or radiculopathy, lumbar region: Secondary | ICD-10-CM | POA: Diagnosis not present

## 2017-01-07 DIAGNOSIS — R030 Elevated blood-pressure reading, without diagnosis of hypertension: Secondary | ICD-10-CM | POA: Diagnosis not present

## 2017-01-17 DIAGNOSIS — R972 Elevated prostate specific antigen [PSA]: Secondary | ICD-10-CM | POA: Diagnosis not present

## 2017-02-03 DIAGNOSIS — M1712 Unilateral primary osteoarthritis, left knee: Secondary | ICD-10-CM | POA: Diagnosis not present

## 2017-02-04 DIAGNOSIS — K621 Rectal polyp: Secondary | ICD-10-CM | POA: Diagnosis not present

## 2017-02-04 DIAGNOSIS — Z86010 Personal history of colonic polyps: Secondary | ICD-10-CM | POA: Diagnosis not present

## 2017-02-04 DIAGNOSIS — D126 Benign neoplasm of colon, unspecified: Secondary | ICD-10-CM | POA: Diagnosis not present

## 2017-02-06 DIAGNOSIS — D126 Benign neoplasm of colon, unspecified: Secondary | ICD-10-CM | POA: Diagnosis not present

## 2017-02-12 DIAGNOSIS — M1712 Unilateral primary osteoarthritis, left knee: Secondary | ICD-10-CM | POA: Diagnosis not present

## 2017-02-17 DIAGNOSIS — M1712 Unilateral primary osteoarthritis, left knee: Secondary | ICD-10-CM | POA: Diagnosis not present

## 2017-03-07 DIAGNOSIS — D649 Anemia, unspecified: Secondary | ICD-10-CM | POA: Diagnosis not present

## 2017-03-07 DIAGNOSIS — G2581 Restless legs syndrome: Secondary | ICD-10-CM | POA: Diagnosis not present

## 2017-03-07 DIAGNOSIS — E7800 Pure hypercholesterolemia, unspecified: Secondary | ICD-10-CM | POA: Diagnosis not present

## 2017-03-07 DIAGNOSIS — Z0001 Encounter for general adult medical examination with abnormal findings: Secondary | ICD-10-CM | POA: Diagnosis not present

## 2017-03-07 DIAGNOSIS — G629 Polyneuropathy, unspecified: Secondary | ICD-10-CM | POA: Diagnosis not present

## 2017-03-07 DIAGNOSIS — G4700 Insomnia, unspecified: Secondary | ICD-10-CM | POA: Diagnosis not present

## 2017-03-07 DIAGNOSIS — F331 Major depressive disorder, recurrent, moderate: Secondary | ICD-10-CM | POA: Diagnosis not present

## 2017-03-07 DIAGNOSIS — Z79899 Other long term (current) drug therapy: Secondary | ICD-10-CM | POA: Diagnosis not present

## 2017-03-07 DIAGNOSIS — M5090 Cervical disc disorder, unspecified, unspecified cervical region: Secondary | ICD-10-CM | POA: Diagnosis not present

## 2017-03-26 DIAGNOSIS — E871 Hypo-osmolality and hyponatremia: Secondary | ICD-10-CM | POA: Diagnosis not present

## 2017-04-07 DIAGNOSIS — M4802 Spinal stenosis, cervical region: Secondary | ICD-10-CM | POA: Diagnosis not present

## 2017-04-07 DIAGNOSIS — R030 Elevated blood-pressure reading, without diagnosis of hypertension: Secondary | ICD-10-CM | POA: Diagnosis not present

## 2017-04-07 DIAGNOSIS — M5417 Radiculopathy, lumbosacral region: Secondary | ICD-10-CM | POA: Diagnosis not present

## 2017-04-07 DIAGNOSIS — M47816 Spondylosis without myelopathy or radiculopathy, lumbar region: Secondary | ICD-10-CM | POA: Diagnosis not present

## 2017-04-07 DIAGNOSIS — Z6829 Body mass index (BMI) 29.0-29.9, adult: Secondary | ICD-10-CM | POA: Diagnosis not present

## 2017-04-08 ENCOUNTER — Other Ambulatory Visit: Payer: Self-pay | Admitting: Nurse Practitioner

## 2017-05-24 ENCOUNTER — Other Ambulatory Visit: Payer: Self-pay | Admitting: Neurology

## 2017-06-02 ENCOUNTER — Telehealth: Payer: Self-pay

## 2017-06-02 ENCOUNTER — Ambulatory Visit: Payer: Medicare Other | Admitting: Adult Health

## 2017-06-02 ENCOUNTER — Encounter: Payer: Self-pay | Admitting: Adult Health

## 2017-06-02 VITALS — BP 125/79 | HR 88 | Ht 74.0 in | Wt 227.0 lb

## 2017-06-02 DIAGNOSIS — G2581 Restless legs syndrome: Secondary | ICD-10-CM

## 2017-06-02 DIAGNOSIS — G608 Other hereditary and idiopathic neuropathies: Secondary | ICD-10-CM

## 2017-06-02 DIAGNOSIS — G6289 Other specified polyneuropathies: Secondary | ICD-10-CM

## 2017-06-02 MED ORDER — GABAPENTIN 300 MG PO CAPS: ORAL_CAPSULE | ORAL | 11 refills | Status: DC

## 2017-06-02 NOTE — Patient Instructions (Addendum)
Your Plan:  Continue trileptal and gabapentin Continue Requip  Gabapentin: Take 300 mg early afternoon and 600 mg at bedtime      Thank you for coming to see [place] at [facility]. I hope we have been able to provide you high quality care today.  You may receive a patient satisfaction survey over the next few weeks. We would appreciate your feedback and comments so that we may continue to improve ourselves and the health of our patients.

## 2017-06-02 NOTE — Telephone Encounter (Signed)
Pt is unsure of his dosage of gabapentin. I called [facility]. They have an RX for gabapentin on file from 05/28/16 from Dr. Krista Blue for pt to be taking gabapentin 300mg  3 tablets QHS.

## 2017-06-02 NOTE — Progress Notes (Signed)
PATIENT: Dustin [name] DOB: 06/15/1940  REASON FOR VISIT: follow up HISTORY FROM: patient  HISTORY OF PRESENT ILLNESS:  HISTORY Patient had long-standing history of mild peripheral neuropathy, has been taking Trileptal 150 mg twice a day for bilateral feet paresthesia, also taking Metanx, had a history of depression, on polypharmacy treatment, seroquel 300 mg at nighttime.  He was referred urgently back to our clinic by his primary care physician Dr. Dierdre Forth for worsening difficulty sleeping, urge to move his leg,  Most recent office visit with Dr. Mariea Clonts was on May 26 2016, I reviewed and summarized referring note, he had a history of esophageal reflux, esophagitis,  He complains of worsening restless leg symptoms, difficulty holding his leg still when relaxed, trying to go to sleep, he reported 48 hours without sleeping because of the leg discomfort, urge to move  He continues to complains bilateral feet numbness tingling needle prick, at the plantar surface, below the ankle,  He denies bowel and bladder incontinence, denies low back pain,   I reviewed the laboratory evaluation in December 2017, INR was 1.22, normal TSH, free T4, CBC showed anemia hemoglobin of 8.6, HCT of 24, BMP showed glucose of 100, calcium of 8.4, negative RPR,  He was diagnosed with iron deficiency anemia, has been treated with iron supplements since March 2018,  He tried gabapentin and Lyrica in the past, did not help, Lyrica made him feel funny,  I reviewed CT head on February 09 2016, no acute abnormality, mild generalized atrophy, supratentorium small vessel disease,  He was started on Requip 1 mg recently, only taking 0.25 milligrams every night, no significant improvement  He denies significant low back pain, no bowel and bladder incontinence, no bilateral fingertips paresthesia  UPDATE July 22 2016: Reviewed laboratory evaluation in April 2018, normal and negative ANA, C-reactive  protein, homocystine level, folic acid, iron level, ferritin was 157, thyroid function test, B12,  EMG nerve conduction study on May 31 2016 showed evidence of length dependent mild axonal sensorimotor polyneuropathy, there is no evidence of right lumbosacral radiculopathy.  Today he complains of intermittent sharp radiating pain at right lower extremity, despite polypharmacy at night time including gabapentin 300 mg 3 tablets at bedtime, seroquel 600 mg at bedtime,  taking Requip 1mg , clonazepam 0.5mg , melatonin    he also complains of new onset right lower extremity pain radiating along the right calf when he first bear weight, improved after few seconds, tenderness of the right calf upon deep palpitation   UPDATE September 18 2016: He reported inconsistent response to oxycodone ER 10 mg every night, despite polypharmacy of seroquel 600 mg, oxycodone ER 10 mg, Requip 1 mg, clonazepam 0.5 milligrams, melatonin, gabapentin up to 900 mg every night, sometimes he still have difficulty sleeping, uncontrollable right leg jumping movement,  We have personally reviewed MRI lumbar in June 2018, multilevel degenerative disc disease, with L5-S1 moderate to severe foraminal narrowing,  He is going to have epidural injection by pain management Dr. Clydell Hakim soon, he does complains of right hip pain, radiating pain to right calf, no bowel and bladder incontinence  Today 06/02/17:  Dustin [name] is a 77 year old male with a history of peripheral neuropathy and restless leg syndrome.  He returns today for follow-up.  He is currently taking gabapentin 600 mg at bedtime.  He was unaware that he could take up to 900 mg.  He states that he has not been taking clonazepam.  Denies any increase in his symptoms.  He continues on Requip for restless legs.  Reports that this controls his symptoms.  The patient reports that he did get a prescription for matanx but it is expensive.  The patient reports that he has been  doing well in the morning.  He began to have the pins-and-needles sensation in the lower extremities in the early afternoon.  He reports when he takes his bedtime dose of gabapentin  this resolves.  He denies any significant changes with his gait or balance.  He returns today for an evaluation.   REVIEW OF SYSTEMS: Out of a complete 14 system review of symptoms, the patient complains only of the following symptoms, and all other reviewed systems are negative.  Hearing loss, neck stiffness  ALLERGIES: No Known Allergies  HOME MEDICATIONS: Outpatient Medications Prior to Visit  Medication Sig Dispense Refill  . ferrous fumarate-iron polysaccharide complex (TANDEM) 162-115.2 MG CAPS capsule Take 1 capsule by mouth daily with breakfast. 30 capsule 11  . finasteride (PROSCAR) 5 MG tablet Take 5 mg by mouth daily.      Marland Kitchen gabapentin (NEURONTIN) 300 MG capsule Take 1 capsule (300 mg total) by mouth at bedtime. (Patient taking differently: Take 900 mg by mouth at bedtime. )    . Multiple Vitamins-Minerals (MULTIVITAMIN WITH MINERALS) tablet Take 1 tablet by mouth daily.    . OXcarbazepine (TRILEPTAL) 150 MG tablet TAKE 1 TABLET (150 MG TOTAL) BY MOUTH 2 (TWO) TIMES DAILY. 180 tablet 3  . pantoprazole (PROTONIX) 40 MG tablet Take 40 mg by mouth daily.      . QUEtiapine (SEROQUEL) 300 MG tablet Take 600 mg by mouth at bedtime. Reported on 05/10/2015    . rOPINIRole (REQUIP) 1 MG tablet TAKE 1 TABLET (1 MG TOTAL) BY MOUTH AT BEDTIME. 90 tablet 3  . clonazePAM (KLONOPIN) 0.5 MG tablet Take 1 tablet (0.5 mg total) by mouth at bedtime as needed (restless leg). (Patient not taking: Reported on 06/02/2017)    . L-Methylfolate-Algae-B12-B6 (METANX) 3-90.314-2-35 MG CAPS TAKE ONE CAPSULE DAILY. (Patient not taking: Reported on 06/02/2017) 90 capsule 3   No facility-administered medications prior to visit.     PAST MEDICAL HISTORY: Past Medical History:  Diagnosis Date  . BPH (benign prostatic hyperplasia)     . Chronic combined systolic and diastolic congestive heart failure ([place])   . GERD (gastroesophageal reflux disease)    protonix for control  . Insomnia   . Neck pain   . Neuropathy     PAST SURGICAL HISTORY: Past Surgical History:  Procedure Laterality Date  . ANTERIOR CERVICAL DECOMPRESSION/DISCECTOMY FUSION 4 LEVELS N/A 10/27/2012   Procedure: Cervical Three-Four Cervical Four-Five Cervical Five-Six Cervical Six-Seven  Anterior cervical decompression/diskectomy/fusion;  Surgeon: Erline Levine, MD;  Location: [facility];  Service: Neurosurgery;  Laterality: N/A;  Cervical Three-Four Cervical Four-Five Cervical Five-Six Cervical Six-Seven  Anterior cervical decompression/diskectomy/fusion  . SHOULDER ARTHROSCOPY  2010   rt shoulder  . SHOULDER ARTHROSCOPY  02/15/2011   Procedure: ARTHROSCOPY SHOULDER;  Surgeon: Cynda Familia;  Location: [facility];  Service: Orthopedics;  Laterality: Right;  Debridement  . TONSILLECTOMY      FAMILY HISTORY: Family History  Problem Relation Age of Onset  . Emphysema Mother   . Cirrhosis Father   . Stroke Brother     SOCIAL HISTORY: Social History   Socioeconomic History  . Marital status: Married    Spouse name: [name]  . Number of children: 2  . Years of education: college  . Highest  education level: Not on file  Occupational History  . Occupation: retired  Scientific laboratory technician  . Financial resource strain: Not on file  . Food insecurity:    Worry: Not on file    Inability: Not on file  . Transportation needs:    Medical: Not on file    Non-medical: Not on file  Tobacco Use  . Smoking status: Former Smoker    Packs/day: 1.50    Last attempt to quit: 01/27/1990    Years since quitting: 27.3  . Smokeless tobacco: Never Used  Substance and Sexual Activity  . Alcohol use: No  . Drug use: No  . Sexual activity: Not on file  Lifestyle  . Physical activity:    Days per week: Not on file    Minutes per session: Not  on file  . Stress: Not on file  Relationships  . Social connections:    Talks on phone: Not on file    Gets together: Not on file    Attends religious service: Not on file    Active member of club or organization: Not on file    Attends meetings of clubs or organizations: Not on file    Relationship status: Not on file  . Intimate partner violence:    Fear of current or ex partner: Not on file    Emotionally abused: Not on file    Physically abused: Not on file    Forced sexual activity: Not on file  Other Topics Concern  . Not on file  Social History Narrative   [name]   Pt is retired   Education college    Pt is left handed   Pt consumes 2 cups of coffee daily      PHYSICAL EXAM  Vitals:   06/02/17 0842  BP: 125/79  Pulse: 88  Weight: 227 lb (103 kg)  Height: 6\' 2"  (1.88 m)   Body mass index is 29.15 kg/m.  Generalized: Well developed, in no acute distress   Neurological examination  Mentation: Alert oriented to time, place, history taking. Follows all commands speech and language fluent Cranial nerve II-XII: Pupils were equal round reactive to light. Extraocular movements were full, visual field were full on confrontational test. Facial sensation and strength were normal. Uvula tongue midline. Head turning and shoulder shrug  were normal and symmetric. Motor: The motor testing reveals 5 over 5 strength of all 4 extremities. Good symmetric motor tone is noted throughout.  Sensory: Sensory testing is intact to soft touch on all 4 extremities. No evidence of extinction is noted.  Coordination: Cerebellar testing reveals good finger-nose-finger and heel-to-shin bilaterally.  Gait and station: Gait is slightly wide-based.  Tandem gait unsteady.  Romberg is negative. Reflexes: Deep tendon reflexes are symmetric and normal bilaterally.   DIAGNOSTIC DATA (LABS, IMAGING, TESTING) - I reviewed patient records, labs, notes, testing and imaging  myself where available.  Lab Results  Component Value Date   WBC 5.2 02/11/2016   HGB 8.6 (L) 02/11/2016   HCT 24.4 (L) 02/11/2016   MCV 92.1 02/11/2016   PLT 157 02/11/2016      Component Value Date/Time   NA 132 (L) 02/11/2016 0153   NA 128 (L) 01/30/2015 0809   K 3.0 (L) 02/11/2016 0153   CL 100 (L) 02/11/2016 0153   CO2 24 02/11/2016 0153   GLUCOSE 100 (H) 02/11/2016 0153   BUN 5 (L) 02/11/2016 0153   BUN 18 01/30/2015 0809   CREATININE  0.83 02/11/2016 0153   CALCIUM 8.4 (L) 02/11/2016 0153   PROT 6.0 (L) 01/30/2016 1920   PROT 6.3 01/30/2015 0809   ALBUMIN 4.0 01/30/2016 1920   ALBUMIN 4.2 01/30/2015 0809   AST 22 01/30/2016 1920   ALT 15 (L) 01/30/2016 1920   ALKPHOS 71 01/30/2016 1920   BILITOT 0.8 01/30/2016 1920   BILITOT 0.4 01/30/2015 0809   GFRNONAA >60 02/11/2016 0153   GFRAA >60 02/11/2016 0153   Lab Results  Component Value Date   CHOL 143 01/23/2016   HDL 54 01/23/2016   LDLCALC 77 01/23/2016   TRIG 60 01/23/2016   CHOLHDL 2.6 01/23/2016   Lab Results  Component Value Date   HGBA1C 5.5 01/23/2016   Lab Results  Component Value Date   VITAMINB12 1,522 (H) 05/28/2016   Lab Results  Component Value Date   TSH 2.672 02/11/2016      ASSESSMENT AND PLAN 77 y.o. year old male  has a past medical history of BPH (benign prostatic hyperplasia), Chronic combined systolic and diastolic congestive heart failure ([place]), GERD (gastroesophageal reflux disease), Insomnia, Neck pain, and Neuropathy. here with:  1.  Peripheral neuropathy 2.  Restless leg  The patient will continue on gabapentin taking 600 mg at bedtime.  I instructed the patient that he can take an additional 300 mg in the early afternoon.  The patient will no longer take clonazepam or Metanx.  He will continue on Requip for restless leg.  He is advised that if his symptoms worsen or he develops new symptoms he should let us know.  He will follow-up in 6 months or sooner if  needed.    Ward Givens, MSN, NP-C 06/02/2017, 9:03 AM [facility] [address] [place], [place] [address] [phone]

## 2017-06-04 NOTE — Progress Notes (Signed)
I have reviewed and agreed above plan. 

## 2017-06-07 ENCOUNTER — Other Ambulatory Visit: Payer: Self-pay | Admitting: Neurology

## 2017-07-09 ENCOUNTER — Encounter

## 2017-07-09 ENCOUNTER — Ambulatory Visit: Payer: Medicare Other | Admitting: Neurology

## 2017-08-07 DIAGNOSIS — N401 Enlarged prostate with lower urinary tract symptoms: Secondary | ICD-10-CM | POA: Diagnosis not present

## 2017-08-07 DIAGNOSIS — R3914 Feeling of incomplete bladder emptying: Secondary | ICD-10-CM | POA: Diagnosis not present

## 2017-08-07 DIAGNOSIS — R3911 Hesitancy of micturition: Secondary | ICD-10-CM | POA: Diagnosis not present

## 2017-10-14 ENCOUNTER — Other Ambulatory Visit: Payer: Self-pay | Admitting: Adult Health

## 2017-11-10 ENCOUNTER — Telehealth: Payer: Self-pay | Admitting: Neurology

## 2017-11-10 NOTE — Telephone Encounter (Signed)
Pt is having bilateral weakness and pain in the hip to the knee for the past 6-8 months gradually worsening over time. He has numbness and tingling in the feet most of the time. He has an appt to see Dr Krista Blue on 12/12. If he can be seen sooner please call him at [phone]

## 2017-11-10 NOTE — Telephone Encounter (Signed)
Spoke to patient - his appt has been moved to 11/26/17.

## 2017-11-26 ENCOUNTER — Ambulatory Visit: Payer: Medicare Other | Admitting: Neurology

## 2017-11-26 ENCOUNTER — Encounter: Payer: Self-pay | Admitting: Neurology

## 2017-11-26 ENCOUNTER — Encounter

## 2017-11-26 VITALS — BP 108/63 | HR 83 | Ht 74.0 in | Wt 214.2 lb

## 2017-11-26 DIAGNOSIS — G608 Other hereditary and idiopathic neuropathies: Secondary | ICD-10-CM | POA: Diagnosis not present

## 2017-11-26 DIAGNOSIS — R531 Weakness: Secondary | ICD-10-CM | POA: Insufficient documentation

## 2017-11-26 DIAGNOSIS — G6289 Other specified polyneuropathies: Secondary | ICD-10-CM

## 2017-11-26 DIAGNOSIS — R2681 Unsteadiness on feet: Secondary | ICD-10-CM | POA: Diagnosis not present

## 2017-11-26 DIAGNOSIS — G2581 Restless legs syndrome: Secondary | ICD-10-CM

## 2017-11-26 NOTE — Progress Notes (Signed)
PATIENT: Dustin [name] DOB: [birth year]    HISTORY OF PRESENT ILLNESS:  HISTORY Patient had long-standing history of mild peripheral neuropathy, has been taking Trileptal 150 mg twice a day for bilateral feet paresthesia, also taking Metanx, had a history of depression, on polypharmacy treatment, seroquel 300 mg at nighttime.  He was referred urgently back to our clinic by his primary care physician Dr. Dierdre Forth for worsening difficulty sleeping, urge to move his leg,  Most recent office visit with Dr. Mariea Clonts was on May 26 2016, I reviewed and summarized referring note, he had a history of esophageal reflux, esophagitis,  He complains of worsening restless leg symptoms, difficulty holding his leg still when relaxed, trying to go to sleep, he reported 48 hours without sleeping because of the leg discomfort, urge to move  He continues to complains bilateral feet numbness tingling needle prick, at the plantar surface, below the ankle,  He denies bowel and bladder incontinence, denies low back pain,   I reviewed the laboratory evaluation in December 2017, INR was 1.22, normal TSH, free T4, CBC showed anemia hemoglobin of 8.6, HCT of 24, BMP showed glucose of 100, calcium of 8.4, negative RPR,  He was diagnosed with iron deficiency anemia, has been treated with iron supplements since March 2018,  He tried gabapentin and Lyrica in the past, did not help, Lyrica made him feel funny,  I reviewed CT head on February 09 2016, no acute abnormality, mild generalized atrophy, supratentorium small vessel disease,  He was started on Requip 1 mg recently, only taking 0.25 milligrams every night, no significant improvement  He denies significant low back pain, no bowel and bladder incontinence, no bilateral fingertips paresthesia  UPDATE July 22 2016: Reviewed laboratory evaluation in April 2018, normal and negative ANA, C-reactive protein, homocystine level, folic acid, iron level,  ferritin was 157, thyroid function test, B12,  EMG nerve conduction study on May 31 2016 showed evidence of length dependent mild axonal sensorimotor polyneuropathy, there is no evidence of right lumbosacral radiculopathy.  Today he complains of intermittent sharp radiating pain at right lower extremity, despite polypharmacy at night time including gabapentin 300 mg 3 tablets at bedtime, seroquel 600 mg at bedtime,  taking Requip 88m, clonazepam 0.521m melatonin    he also complains of new onset right lower extremity pain radiating along the right calf when he first bear weight, improved after few seconds, tenderness of the right calf upon deep palpitation   UPDATE September 18 2016: He reported inconsistent response to oxycodone ER 10 mg every night, despite polypharmacy of seroquel 600 mg, oxycodone ER 10 mg, Requip 1 mg, clonazepam 0.5 milligrams, melatonin, gabapentin up to 900 mg every night, sometimes he still have difficulty sleeping, uncontrollable right leg jumping movement,  We have personally reviewed MRI lumbar in June 2018, multilevel degenerative disc disease, with L5-S1 moderate to severe foraminal narrowing,  He is going to have epidural injection by pain management Dr. PaClydell Hakimoon, he does complains of right hip pain, radiating pain to right calf, no bowel and bladder incontinence  Update November 26, 2017 His restless leg symptoms has much improved taking gabapentin 300 mg 1 in the morning, 2 at night, also Trileptal 150 mg twice a day, Requip 1 mg every night,  Today he complains 6 months history of worsening low back pain, bilateral anterior thigh and pain, right worse than left,  he i to pain management Dr. HaMaryjean Kataking Percocet 2 to 6 tablets each day  Had a repeat MRI of lumbar at [facility]surgery August 2019, overall stable multilevel degenerative changes, there was no significant foraminal narrowing, no evidence of nerve root compression,  He was noted  to have variable effort on examination today, right ankle weakness,   REVIEW OF SYSTEMS: Out of a complete 14 system review of symptoms, the patient complains only of the following symptoms, and all other reviewed systems are negative.  Hearing loss, black stool  ALLERGIES: No Known Allergies  HOME MEDICATIONS: Outpatient Medications Prior to Visit  Medication Sig Dispense Refill  . finasteride (PROSCAR) 5 MG tablet Take 5 mg by mouth daily.      Marland Kitchen gabapentin (NEURONTIN) 300 MG capsule TAKE 1 CAPSULE BY MOUTH IN THE AFTERNOON (4PM) AND 2 CAPSULES AT BEDTIME 270 capsule 3  . Melatonin 10 MG TABS Take 10 mg by mouth at bedtime as needed.    . Multiple Vitamins-Minerals (MULTIVITAMIN WITH MINERALS) tablet Take 1 tablet by mouth daily.    . OXcarbazepine (TRILEPTAL) 150 MG tablet TAKE 1 TABLET (150 MG TOTAL) BY MOUTH 2 (TWO) TIMES DAILY. 180 tablet 3  . oxyCODONE-acetaminophen (PERCOCET) 10-325 MG tablet Take 1 tablet by mouth every 4 (four) hours as needed for pain.    . pantoprazole (PROTONIX) 40 MG tablet Take 40 mg by mouth daily.      . QUEtiapine (SEROQUEL) 300 MG tablet Take 600 mg by mouth at bedtime. Reported on 05/10/2015    . rOPINIRole (REQUIP) 1 MG tablet TAKE 1 TABLET (1 MG TOTAL) BY MOUTH AT BEDTIME. 90 tablet 3  . TANDEM 162-115.2 MG CAPS capsule TAKE 1 CAPSULE BY MOUTH DAILY WITH BREAKFAST. 90 capsule 3   No facility-administered medications prior to visit.     PAST MEDICAL HISTORY: Past Medical History:  Diagnosis Date  . BPH (benign prostatic hyperplasia)   . Chronic combined systolic and diastolic congestive heart failure ([place])   . GERD (gastroesophageal reflux disease)    protonix for control  . Insomnia   . Neck pain   . Neuropathy     PAST SURGICAL HISTORY: Past Surgical History:  Procedure Laterality Date  . ANTERIOR CERVICAL DECOMPRESSION/DISCECTOMY FUSION 4 LEVELS N/A 10/27/2012   Procedure: Cervical Three-Four Cervical Four-Five Cervical Five-Six  Cervical Six-Seven  Anterior cervical decompression/diskectomy/fusion;  Surgeon: Erline Levine, MD;  Location: [facility];  Service: Neurosurgery;  Laterality: N/A;  Cervical Three-Four Cervical Four-Five Cervical Five-Six Cervical Six-Seven  Anterior cervical decompression/diskectomy/fusion  . SHOULDER ARTHROSCOPY  2010   rt shoulder  . SHOULDER ARTHROSCOPY  02/15/2011   Procedure: ARTHROSCOPY SHOULDER;  Surgeon: Cynda Familia;  Location: [facility];  Service: Orthopedics;  Laterality: Right;  Debridement  . TONSILLECTOMY      FAMILY HISTORY: Family History  Problem Relation Age of Onset  . Emphysema Mother   . Cirrhosis Father   . Stroke Brother     SOCIAL HISTORY: Social History   Socioeconomic History  . Marital status: Married    Spouse name: [name]  . Number of children: 2  . Years of education: college  . Highest education level: Not on file  Occupational History  . Occupation: retired  Scientific laboratory technician  . Financial resource strain: Not on file  . Food insecurity:    Worry: Not on file    Inability: Not on file  . Transportation needs:    Medical: Not on file    Non-medical: Not on file  Tobacco Use  . Smoking status: Former Smoker  Packs/day: 1.50    Last attempt to quit: 01/27/1990    Years since quitting: 27.8  . Smokeless tobacco: Never Used  Substance and Sexual Activity  . Alcohol use: No  . Drug use: No  . Sexual activity: Not on file  Lifestyle  . Physical activity:    Days per week: Not on file    Minutes per session: Not on file  . Stress: Not on file  Relationships  . Social connections:    Talks on phone: Not on file    Gets together: Not on file    Attends religious service: Not on file    Active member of club or organization: Not on file    Attends meetings of clubs or organizations: Not on file    Relationship status: Not on file  . Intimate partner violence:    Fear of current or ex partner: Not on file     Emotionally abused: Not on file    Physically abused: Not on file    Forced sexual activity: Not on file  Other Topics Concern  . Not on file  Social History Narrative   [name]   Pt is retired   Education college    Pt is left handed   Pt consumes 2 cups of coffee daily      PHYSICAL EXAM  Vitals:   11/26/17 0748  BP: 108/63  Pulse: 83  Weight: 214 lb 4 oz (97.2 kg)  Height: 6\' 2"  (1.88 m)   Body mass index is 27.51 kg/m.  Generalized: Well developed, in no acute distress   Neurological examination  Mentation: Alert oriented to time, place, history taking. Follows all commands speech and language fluent Cranial nerve II-XII: Pupils were equal round reactive to light. Extraocular movements were full, visual field were full on confrontational test. Facial sensation and strength were normal. Uvula tongue midline. Head turning and shoulder shrug  were normal and symmetric. Motor: Variable effort on examinations, right ankle dorsiflexion weakness 3/5, bilateral toe extension flexion weakness, 4/5, bilateral upper extremity, proximal distal, bilateral lower extremity proximal muscle strength was normal. Sensory: Length dependent decreased light touch pinprick, vibratory sensation to ankle level. Coordination: Cerebellar testing reveals good finger-nose-finger and heel-to-shin bilaterally.  Gait and station: He needs pushed up to get up from seated position, and then, dragging his right foot across the floor, Reflexes: Deep tendon reflexes are symmetric and absent at bilateral lower extremity   DIAGNOSTIC DATA (LABS, IMAGING, TESTING) - I reviewed patient records, labs, notes, testing and imaging myself where available.  Lab Results  Component Value Date   WBC 5.2 02/11/2016   HGB 8.6 (L) 02/11/2016   HCT 24.4 (L) 02/11/2016   MCV 92.1 02/11/2016   PLT 157 02/11/2016      Component Value Date/Time   NA 132 (L) 02/11/2016 0153   NA 128 (L)  01/30/2015 0809   K 3.0 (L) 02/11/2016 0153   CL 100 (L) 02/11/2016 0153   CO2 24 02/11/2016 0153   GLUCOSE 100 (H) 02/11/2016 0153   BUN 5 (L) 02/11/2016 0153   BUN 18 01/30/2015 0809   CREATININE 0.83 02/11/2016 0153   CALCIUM 8.4 (L) 02/11/2016 0153   PROT 6.0 (L) 01/30/2016 1920   PROT 6.3 01/30/2015 0809   ALBUMIN 4.0 01/30/2016 1920   ALBUMIN 4.2 01/30/2015 0809   AST 22 01/30/2016 1920   ALT 15 (L) 01/30/2016 1920   ALKPHOS 71 01/30/2016 1920   BILITOT 0.8  01/30/2016 1920   BILITOT 0.4 01/30/2015 0809   GFRNONAA >60 02/11/2016 0153   GFRAA >60 02/11/2016 0153   Lab Results  Component Value Date   CHOL 143 01/23/2016   HDL 54 01/23/2016   LDLCALC 77 01/23/2016   TRIG 60 01/23/2016   CHOLHDL 2.6 01/23/2016   Lab Results  Component Value Date   HGBA1C 5.5 01/23/2016   Lab Results  Component Value Date   VITAMINB12 1,522 (H) 05/28/2016   Lab Results  Component Value Date   TSH 2.672 02/11/2016      ASSESSMENT AND PLAN 77 y.o. year old male   Peripheral neuropathy Restless leg symptoms  Overall under good control keep current dose of gabapentin 300 mg 1/2 Trileptal 150 mg twice a day, 1 mg every day New onset bilateral anterior thigh knee pain, weakness,  CPK, acetylcholine receptor antibody to rule out intrinsic muscle neuromuscular junctional disease  EMG nerve conduction study   Marcial Pacas, M.D. Ph.D.  [facility] [place],  [address] Phone: [phone] Fax:      [phone]

## 2017-11-28 ENCOUNTER — Encounter: Payer: Medicare Other | Admitting: Neurology

## 2017-11-28 ENCOUNTER — Ambulatory Visit (INDEPENDENT_AMBULATORY_CARE_PROVIDER_SITE_OTHER): Payer: Medicare Other | Admitting: Neurology

## 2017-11-28 DIAGNOSIS — G6289 Other specified polyneuropathies: Secondary | ICD-10-CM

## 2017-11-28 DIAGNOSIS — G2581 Restless legs syndrome: Secondary | ICD-10-CM | POA: Diagnosis not present

## 2017-11-28 DIAGNOSIS — G608 Other hereditary and idiopathic neuropathies: Secondary | ICD-10-CM

## 2017-11-28 DIAGNOSIS — M6281 Muscle weakness (generalized): Secondary | ICD-10-CM | POA: Insufficient documentation

## 2017-11-28 DIAGNOSIS — R2681 Unsteadiness on feet: Secondary | ICD-10-CM | POA: Diagnosis not present

## 2017-11-28 DIAGNOSIS — Z0289 Encounter for other administrative examinations: Secondary | ICD-10-CM

## 2017-11-28 DIAGNOSIS — R531 Weakness: Secondary | ICD-10-CM | POA: Diagnosis not present

## 2017-11-28 LAB — CK: Total CK: 86 U/L (ref 24–204)

## 2017-11-28 LAB — ACETYLCHOLINE RECEPTOR, BINDING: AChR Binding Ab, Serum: <0.03 nmol/L (ref 0.00–0.24)

## 2017-11-28 NOTE — Progress Notes (Signed)
PATIENT: Dustin [name] DOB: [birth year]    HISTORY OF PRESENT ILLNESS:  HISTORY Patient had long-standing history of mild peripheral neuropathy, has been taking Trileptal 150 mg twice a day for bilateral feet paresthesia, also taking Metanx, had a history of depression, on polypharmacy treatment, seroquel 300 mg at nighttime.  He was referred urgently back to our clinic by his primary care physician Dr. Dierdre Forth for worsening difficulty sleeping, urge to move his leg,  Most recent office visit with Dr. Mariea Clonts was on May 26 2016, I reviewed and summarized referring note, he had a history of esophageal reflux, esophagitis,  He complains of worsening restless leg symptoms, difficulty holding his leg still when relaxed, trying to go to sleep, he reported 48 hours without sleeping because of the leg discomfort, urge to move  He continues to complains bilateral feet numbness tingling needle prick, at the plantar surface, below the ankle,  He denies bowel and bladder incontinence, denies low back pain,   I reviewed the laboratory evaluation in December 2017, INR was 1.22, normal TSH, free T4, CBC showed anemia hemoglobin of 8.6, HCT of 24, BMP showed glucose of 100, calcium of 8.4, negative RPR,  He was diagnosed with iron deficiency anemia, has been treated with iron supplements since March 2018,  He tried gabapentin and Lyrica in the past, did not help, Lyrica made him feel funny,  I reviewed CT head on February 09 2016, no acute abnormality, mild generalized atrophy, supratentorium small vessel disease,  He was started on Requip 1 mg recently, only taking 0.25 milligrams every night, no significant improvement  He denies significant low back pain, no bowel and bladder incontinence, no bilateral fingertips paresthesia  UPDATE July 22 2016: Reviewed laboratory evaluation in April 2018, normal and negative ANA, C-reactive protein, homocystine level, folic acid, iron level,  ferritin was 157, thyroid function test, B12,  EMG nerve conduction study on May 31 2016 showed evidence of length dependent mild axonal sensorimotor polyneuropathy, there is no evidence of right lumbosacral radiculopathy.  Today he complains of intermittent sharp radiating pain at right lower extremity, despite polypharmacy at night time including gabapentin 300 mg 3 tablets at bedtime, seroquel 600 mg at bedtime,  taking Requip 88m, clonazepam 0.521m melatonin    he also complains of new onset right lower extremity pain radiating along the right calf when he first bear weight, improved after few seconds, tenderness of the right calf upon deep palpitation   UPDATE September 18 2016: He reported inconsistent response to oxycodone ER 10 mg every night, despite polypharmacy of seroquel 600 mg, oxycodone ER 10 mg, Requip 1 mg, clonazepam 0.5 milligrams, melatonin, gabapentin up to 900 mg every night, sometimes he still have difficulty sleeping, uncontrollable right leg jumping movement,  We have personally reviewed MRI lumbar in June 2018, multilevel degenerative disc disease, with L5-S1 moderate to severe foraminal narrowing,  He is going to have epidural injection by pain management Dr. PaClydell Hakimoon, he does complains of right hip pain, radiating pain to right calf, no bowel and bladder incontinence  Update November 26, 2017 His restless leg symptoms has much improved taking gabapentin 300 mg 1 in the morning, 2 at night, also Trileptal 150 mg twice a day, Requip 1 mg every night,  Today he complains 6 months history of worsening low back pain, bilateral anterior thigh and pain, right worse than left,  he i to pain management Dr. HaMaryjean Kataking Percocet 2 to 6 tablets each day  Had a repeat MRI of lumbar at [facility] August 2019, overall stable multilevel degenerative changes, there was no significant foraminal narrowing, no evidence of nerve root compression,  He was noted  to have variable effort on examination today, right ankle weakness,  UPDATE Nov 28 2017: Patient return for electrodiagnostic study today, which showed much significant worsening in comparison to previous study in April 2018, EMG nerve conduction study April 2018 showed evidence of length dependent mild to moderate axonal sensorimotor polyneuropathy, at that time, selective needle examination of right lower extremity muscle showed no significant abnormality.  He complains of worsening gait abnormality and anterior thigh muscle achy pain, weakness over past 6 months,  EMG nerve conduction study today continue with evidence of length dependent sensorimotor polyneuropathy, but there is significant active neuropathic changes at selected bilateral lower extremity muscles, involving bilateral tibialis anterior, tibialis posterior, peroneal longus, medial gastrocnemius, milder degree bilateral vastus lateralis, and right biceps femoris short head,   MRI of lumbar at [place] [facility] in August 2019 showed stable degenerative changes, would not explain current findings, he does complains of decreased taste, decreased appetite, denies significant pelvic pain,  CPK and acetylcholine receptor antibodies was negative  REVIEW OF SYSTEMS: Out of a complete 14 system review of symptoms, the patient complains only of the following symptoms, and all other reviewed systems are negative.  Gait abnormality  ALLERGIES: No Known Allergies  HOME MEDICATIONS: Outpatient Medications Prior to Visit  Medication Sig Dispense Refill  . finasteride (PROSCAR) 5 MG tablet Take 5 mg by mouth daily.      Marland Kitchen gabapentin (NEURONTIN) 300 MG capsule TAKE 1 CAPSULE BY MOUTH IN THE AFTERNOON (4PM) AND 2 CAPSULES AT BEDTIME 270 capsule 3  . Melatonin 10 MG TABS Take 10 mg by mouth at bedtime as needed.    . Multiple Vitamins-Minerals (MULTIVITAMIN WITH MINERALS) tablet Take 1 tablet by mouth daily.    . OXcarbazepine  (TRILEPTAL) 150 MG tablet TAKE 1 TABLET (150 MG TOTAL) BY MOUTH 2 (TWO) TIMES DAILY. 180 tablet 3  . oxyCODONE-acetaminophen (PERCOCET) 10-325 MG tablet Take 1 tablet by mouth every 4 (four) hours as needed for pain.    . pantoprazole (PROTONIX) 40 MG tablet Take 40 mg by mouth daily.      . QUEtiapine (SEROQUEL) 300 MG tablet Take 600 mg by mouth at bedtime. Reported on 05/10/2015    . rOPINIRole (REQUIP) 1 MG tablet TAKE 1 TABLET (1 MG TOTAL) BY MOUTH AT BEDTIME. 90 tablet 3  . TANDEM 162-115.2 MG CAPS capsule TAKE 1 CAPSULE BY MOUTH DAILY WITH BREAKFAST. 90 capsule 3   No facility-administered medications prior to visit.     PAST MEDICAL HISTORY: Past Medical History:  Diagnosis Date  . BPH (benign prostatic hyperplasia)   . Chronic combined systolic and diastolic congestive heart failure ([place])   . GERD (gastroesophageal reflux disease)    protonix for control  . Insomnia   . Neck pain   . Neuropathy     PAST SURGICAL HISTORY: Past Surgical History:  Procedure Laterality Date  . ANTERIOR CERVICAL DECOMPRESSION/DISCECTOMY FUSION 4 LEVELS N/A 10/27/2012   Procedure: Cervical Three-Four Cervical Four-Five Cervical Five-Six Cervical Six-Seven  Anterior cervical decompression/diskectomy/fusion;  Surgeon: Erline Levine, MD;  Location: [facility];  Service: Neurosurgery;  Laterality: N/A;  Cervical Three-Four Cervical Four-Five Cervical Five-Six Cervical Six-Seven  Anterior cervical decompression/diskectomy/fusion  . SHOULDER ARTHROSCOPY  2010   rt shoulder  . SHOULDER ARTHROSCOPY  02/15/2011   Procedure: ARTHROSCOPY SHOULDER;  Surgeon: Cynda Familia;  Location: [facility];  Service: Orthopedics;  Laterality: Right;  Debridement  . TONSILLECTOMY      FAMILY HISTORY: Family History  Problem Relation Age of Onset  . Emphysema Mother   . Cirrhosis Father   . Stroke Brother     SOCIAL HISTORY: Social History   Socioeconomic History  . Marital status:  Married    Spouse name: [name]  . Number of children: 2  . Years of education: college  . Highest education level: Not on file  Occupational History  . Occupation: retired  Scientific laboratory technician  . Financial resource strain: Not on file  . Food insecurity:    Worry: Not on file    Inability: Not on file  . Transportation needs:    Medical: Not on file    Non-medical: Not on file  Tobacco Use  . Smoking status: Former Smoker    Packs/day: 1.50    Last attempt to quit: 01/27/1990    Years since quitting: 27.8  . Smokeless tobacco: Never Used  Substance and Sexual Activity  . Alcohol use: No  . Drug use: No  . Sexual activity: Not on file  Lifestyle  . Physical activity:    Days per week: Not on file    Minutes per session: Not on file  . Stress: Not on file  Relationships  . Social connections:    Talks on phone: Not on file    Gets together: Not on file    Attends religious service: Not on file    Active member of club or organization: Not on file    Attends meetings of clubs or organizations: Not on file    Relationship status: Not on file  . Intimate partner violence:    Fear of current or ex partner: Not on file    Emotionally abused: Not on file    Physically abused: Not on file    Forced sexual activity: Not on file  Other Topics Concern  . Not on file  Social History Narrative   [name]   Pt is retired   Education college    Pt is left handed   Pt consumes 2 cups of coffee daily      PHYSICAL EXAM  PHYSICAL EXAMNIATION:  Gen: NAD, conversant, well nourised, obese, well groomed                     Cardiovascular: Regular rate rhythm, no peripheral edema, warm, nontender. Eyes: Conjunctivae clear without exudates or hemorrhage Neck: Supple, no carotid bruits. Pulmonary: Clear to auscultation bilaterally   NEUROLOGICAL EXAM:  MENTAL STATUS: Speech:    Speech is normal; fluent and spontaneous with normal comprehension.   Cognition:     Orientation to time, place and person     Normal recent and remote memory     Normal Attention span and concentration     Normal Language, naming, repeating,spontaneous speech     Fund of knowledge   CRANIAL NERVES: CN II: Visual fields are full to confrontation.  Pupils are round equal and briskly reactive to light. CN III, IV, VI: extraocular movement are normal. No ptosis. CN V: Facial sensation is intact to pinprick in all 3 divisions bilaterally. Corneal responses are intact.  CN VII: Face is symmetric with normal eye closure and smile. CN VIII: Hearing is normal to rubbing fingers CN IX, X: Palate elevates symmetrically. Phonation is normal. CN XI: Head  turning and shoulder shrug are intact CN XII: Tongue is midline with normal movements and no atrophy.  MOTOR: There is no significant upper extremity muscle weakness, he has mild bilateral hip flexion weakness, mild to moderate bilateral ankle dorsiflexion, toe flexion extension weakness, right worse than left.  REFLEXES: Reflexes are hypoactive and symmetric  SENSORY: Length dependent decreased to light touch, pinprick, to bilateral knee level  COORDINATION: Rapid alternating movements and fine finger movements are intact. There is no dysmetria on finger-to-nose and heel-knee-shin.    GAIT/STANCE: He needs pushed up to get up from seated position, wide-based, unsteady, right foot drop, could not walk on heels,  DIAGNOSTIC DATA (LABS, IMAGING, TESTING) - I reviewed patient records, labs, notes, testing and imaging myself where available.  Lab Results  Component Value Date   WBC 5.2 02/11/2016   HGB 8.6 (L) 02/11/2016   HCT 24.4 (L) 02/11/2016   MCV 92.1 02/11/2016   PLT 157 02/11/2016      Component Value Date/Time   NA 132 (L) 02/11/2016 0153   NA 128 (L) 01/30/2015 0809   K 3.0 (L) 02/11/2016 0153   CL 100 (L) 02/11/2016 0153   CO2 24 02/11/2016 0153   GLUCOSE 100 (H) 02/11/2016 0153   BUN 5  (L) 02/11/2016 0153   BUN 18 01/30/2015 0809   CREATININE 0.83 02/11/2016 0153   CALCIUM 8.4 (L) 02/11/2016 0153   PROT 6.0 (L) 01/30/2016 1920   PROT 6.3 01/30/2015 0809   ALBUMIN 4.0 01/30/2016 1920   ALBUMIN 4.2 01/30/2015 0809   AST 22 01/30/2016 1920   ALT 15 (L) 01/30/2016 1920   ALKPHOS 71 01/30/2016 1920   BILITOT 0.8 01/30/2016 1920   BILITOT 0.4 01/30/2015 0809   GFRNONAA >60 02/11/2016 0153   GFRAA >60 02/11/2016 0153   Lab Results  Component Value Date   CHOL 143 01/23/2016   HDL 54 01/23/2016   LDLCALC 77 01/23/2016   TRIG 60 01/23/2016   CHOLHDL 2.6 01/23/2016   Lab Results  Component Value Date   HGBA1C 5.5 01/23/2016   Lab Results  Component Value Date   VITAMINB12 1,522 (H) 05/28/2016   Lab Results  Component Value Date   TSH 2.672 02/11/2016      ASSESSMENT AND PLAN 77 y.o. year old male   Peripheral neuropathy Restless leg symptoms  Overall under good control keep current dose of gabapentin 300 mg 1/2 Trileptal 150 mg twice a day, Requip 1 mg every day New onset bilateral anterior thigh knee pain, weakness,  CPK, acetylcholine receptor antibody was normal, no evidence of intrinsic muscle disease.  EMG nerve conduction study showed significantly worsening, evidence of active neuropathic changes involving bilateral L4-5 S1 myotomes, right worse than left.  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   Marcial Pacas, M.D. Ph.D.  [facility] [place], [place] [address] Phone: [phone] Fax:      [phone]

## 2017-11-28 NOTE — Procedures (Signed)
Full Name: Dustin [name] Gender: Male MRN #: [id number] Date of Birth: 03-Nov-2040    Visit Date: 11/28/17 09:45 Age: 77 Years 2 Months Old Examining Physician: Marcial Pacas, MD  Referring Physician: Krista Blue, MD History: 77 year old male, with history of peripheral neuropathy, restless leg syndrome, chronic low back pain, resenting with 8-month history of worsening bilateral anterior thigh pain, muscle achiness, increased gait abnormality,  Summary of the tests:  Nerves conduction study: Right sural, superficial peroneal sensory responses were absent.  Right tibial, peroneal to EDB motor responses were absent.  Right median sensory and ulnar responses showed mildly prolonged peak latency with mild to moderately decreased to snap amplitude.  Right ulnar and median motor responses showed normal distal latency, C map amplitude, with mildly decreased conduction velocity.  Electromyography: Selected needle examinations were performed at bilateral lower extremity muscles, right upper extremity muscles, bilateral lumbosacral paraspinal muscles.  There is significant active neuropathic changes, increased insertional activity, spontaneous activity, large complex motor unit potential at bilateral tibialis anterior, tibialis posterior, peroneal longus, medial gastrocnemius, right worse than left.  There is also evidence of chronic neuropathic changes involving bilateral vastus lateralis, right biceps femoris short head, there was no significant abnormality noted at right gluteus medius, or bilateral lumbosacral paraspinal muscles.    There was no significant abnormality at right upper extremity muscles.  Conclusion: This is an abnormal study.  There is evidence of length dependent sensorimotor polyneuropathy, which is similar to previous study in April 2018.  But there is evidence of active neuropathic changes involving bilateral L4-5 S1 myotomes, right worse than left, which was not present at  previous study.  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    ------------------------------- Marcial Pacas, M.D. PhD  [facility] [place], [place] [address] Tel: [phone] Fax: [phone]        [facility]    Nerve / Sites Muscle Latency Ref. Amplitude Ref. Rel Amp Segments Distance Velocity Ref. Area    ms ms mV mV %  cm m/s m/s mVms  R Median - APB     Wrist APB 4.1 ?4.4 4.8 ?4.0 100 Wrist - APB 7   16.5     Upper arm APB 9.7  4.6  95.7 Upper arm - Wrist 26 46 ?49 15.4  R Ulnar - ADM     Wrist ADM 3.3 ?3.3 10.6 ?6.0 100 Wrist - ADM 7   34.2     B.Elbow ADM 8.4  9.2  87.2 B.Elbow - Wrist 22 44 ?49 32.3     A.Elbow ADM 10.5  9.0  97.2 A.Elbow - B.Elbow 10 48 ?49 32.4         A.Elbow - Wrist      R Peroneal - EDB     Ankle EDB NR ?6.5 NR ?2.0 NR Ankle - EDB 9   NR     Fib head EDB NR  NR  NR Fib head - Ankle   ?44 NR  R Tibial - AH     Ankle AH NR ?5.8 NR ?4.0 NR Ankle - AH 9   NR     Pop fossa AH NR  NR  NR Pop fossa - Ankle 42 NR ?41 NR  R Peroneal - Tib Ant     Fib Head Tib Ant 4.5 ?4.7 1.1 ?3.0 100 Fib Head - Tib Ant 10   5.6     Pop fossa Tib Ant 6.5  0.9  83.3 Pop fossa - Fib Head 9 44 ?44 4.8               SNC    Nerve / Sites Rec. Site Peak Lat Ref.  Amp Ref. Segments Distance    ms ms V V  cm  R Radial - Anatomical snuff box (Forearm)     Forearm Wrist 3.1 ?2.9 5 ?15 Forearm - Wrist 10  R Sural - Ankle (Calf)     Calf Ankle NR ?4.4 NR ?6 Calf - Ankle 14  R Superficial peroneal - Ankle     Lat leg Ankle NR ?4.4 NR ?6 Lat leg - Ankle 14  R Median - Orthodromic (Dig II, Mid palm)     Dig II Wrist 4.0 ?3.4 3 ?10 Dig II - Wrist 13  R Ulnar - Orthodromic, (Dig V, Mid palm)     Dig V Wrist 3.4 ?3.1 3 ?5 Dig V - Wrist 32               F  Wave    Nerve F Lat Ref.   ms ms  R Ulnar - ADM 35.1 ?32.0       EMG full       EMG Summary Table    Spontaneous MUAP Recruitment    Muscle IA Fib PSW Fasc Other Amp Dur. Poly Pattern  R. Tibialis anterior Increased 2+ None None _______ Increased Increased 2+ Reduced  R. Peroneus longus Increased 1+ None None _______ Increased Increased 1+ Reduced  R. Tibialis posterior Increased 1+ None None _______ Increased Increased 1+ Reduced  R. Gastrocnemius (Medial head) Increased 1+ None None _______ Increased Increased 1+ Reduced  R. Vastus lateralis Increased None None None _______ Increased Increased 1+ Reduced  R. Biceps femoris (short head) Increased None None None _______ Increased Increased 1+ Reduced  R. Gluteus medius Normal None None None _______ Normal Normal Normal Normal  R. Lumbar paraspinals (mid) Normal None None None _______ Normal Normal Normal Normal  R. Lumbar paraspinals (low) Normal None None None _______ Normal Normal Normal Normal  L. Tibialis anterior Increased 1+ None None _______ Increased Increased 1+ Reduced  L. Tibialis posterior Normal None None None _______ Normal Normal Normal Normal  L. Gastrocnemius (Medial head) Increased None None None _______ Increased Increased 1+ Reduced  L. Vastus lateralis Increased None None None _______ Normal Normal Normal Reduced  L. Lumbar paraspinals (mid) Normal None None None _______ Normal Normal Normal Normal  L. Lumbar paraspinals (low) Normal None None None _______ Normal Normal Normal Normal  R. First dorsal interosseous Normal None None None _______ Normal Normal Normal Normal  R. Pronator teres Normal None None None _______ Normal Normal Normal Normal  R. Biceps brachii Normal None None None _______ Normal Normal Normal Normal  R. Deltoid Normal None None None _______ Normal Normal Normal Normal  R. Extensor digitorum communis Normal None None None _______ Normal Normal Normal Normal

## 2017-12-02 ENCOUNTER — Ambulatory Visit: Payer: Medicare Other | Admitting: Adult Health

## 2017-12-10 ENCOUNTER — Telehealth: Payer: Self-pay | Admitting: Neurology

## 2017-12-10 NOTE — Telephone Encounter (Signed)
[facility] Medicare order sent to GI pt is aware of this to call GI at [phone].

## 2017-12-13 ENCOUNTER — Other Ambulatory Visit: Payer: Self-pay | Admitting: Neurology

## 2017-12-24 ENCOUNTER — Telehealth: Payer: Self-pay | Admitting: Neurology

## 2017-12-24 ENCOUNTER — Ambulatory Visit
Admission: RE | Admit: 2017-12-24 | Discharge: 2017-12-24 | Disposition: A | Discharge status: Home / Self Care | Payer: Medicare Other | Source: Ambulatory Visit | Attending: Neurology | Admitting: Neurology

## 2017-12-24 DIAGNOSIS — M6281 Muscle weakness (generalized): Secondary | ICD-10-CM

## 2017-12-24 NOTE — Telephone Encounter (Signed)
Please call patient, MRI of pelvic showed enlarged prostate, post partial bladder outlet obstruction, if he is symptomatic, he may consider urological evaluations  Bilateral moderate hip joint degenerative changes  Check with him about his bilateral leg weakness, if he continues to worse, move up his appointment.  IMPRESSION: 1. Enlarged prostate gland with median lobe hypertrophy impressing on the base the bladder and possibly causing partial bladder outlet obstruction. The bladder is moderately distended and there are small cellules or early trabeculation. No mass. 2. Moderate bilateral hip joint degenerative changes but no stress fracture or AVN. No pelvic fractures or bone lesions. 3. Small amount of free pelvic fluid of uncertain significance or etiology.

## 2017-12-24 NOTE — Telephone Encounter (Signed)
Spoke to patient - he is aware of his results.  States he already has a pending appt with urology.  His bilateral leg weakness is about the same.  I offered him an earlier appt but he declined.  He would like to keep his 01/20/18 appt.  He will call if his symptoms worsen.

## 2017-12-30 ENCOUNTER — Other Ambulatory Visit: Payer: Self-pay | Admitting: Nephrology

## 2017-12-30 ENCOUNTER — Ambulatory Visit
Admission: RE | Admit: 2017-12-30 | Discharge: 2017-12-30 | Disposition: A | Discharge status: Home / Self Care | Payer: Medicare Other | Source: Ambulatory Visit | Attending: Nephrology | Admitting: Nephrology

## 2017-12-30 DIAGNOSIS — Z87891 Personal history of nicotine dependence: Secondary | ICD-10-CM

## 2018-01-20 ENCOUNTER — Encounter: Payer: Self-pay | Admitting: Neurology

## 2018-01-20 ENCOUNTER — Ambulatory Visit: Payer: Medicare Other | Admitting: Neurology

## 2018-01-20 VITALS — BP 105/66 | HR 85 | Ht 74.0 in | Wt 214.5 lb

## 2018-01-20 DIAGNOSIS — R2681 Unsteadiness on feet: Secondary | ICD-10-CM | POA: Diagnosis not present

## 2018-01-20 DIAGNOSIS — G2581 Restless legs syndrome: Secondary | ICD-10-CM

## 2018-01-20 DIAGNOSIS — R51 Headache: Secondary | ICD-10-CM | POA: Diagnosis not present

## 2018-01-20 DIAGNOSIS — R519 Headache, unspecified: Secondary | ICD-10-CM | POA: Insufficient documentation

## 2018-01-20 MED ORDER — GABAPENTIN 300 MG PO CAPS: 600.0000 mg | ORAL_CAPSULE | Freq: Three times a day (TID) | ORAL | 11 refills | Status: DC

## 2018-01-20 NOTE — Progress Notes (Signed)
PATIENT: Dustin [name] DOB: [birth year]    HISTORY OF PRESENT ILLNESS:  HISTORY Patient had long-standing history of mild peripheral neuropathy, has been taking Trileptal 150 mg twice a day for bilateral feet paresthesia, also taking Metanx, had a history of depression, on polypharmacy treatment, seroquel 300 mg at nighttime.  He was referred urgently back to our clinic by his primary care physician Dr. Dierdre Forth for worsening difficulty sleeping, urge to move his leg,  Most recent office visit with Dr. Mariea Clonts was on May 26 2016, I reviewed and summarized referring note, he had a history of esophageal reflux, esophagitis,  He complains of worsening restless leg symptoms, difficulty holding his leg still when relaxed, trying to go to sleep, he reported 48 hours without sleeping because of the leg discomfort, urge to move  He continues to complains bilateral feet numbness tingling needle prick, at the plantar surface, below the ankle,  He denies bowel and bladder incontinence, denies low back pain,   I reviewed the laboratory evaluation in December 2017, INR was 1.22, normal TSH, free T4, CBC showed anemia hemoglobin of 8.6, HCT of 24, BMP showed glucose of 100, calcium of 8.4, negative RPR,  He was diagnosed with iron deficiency anemia, has been treated with iron supplements since March 2018,  He tried gabapentin and Lyrica in the past, did not help, Lyrica made him feel funny,  I reviewed CT head on February 09 2016, no acute abnormality, mild generalized atrophy, supratentorium small vessel disease,  He was started on Requip 1 mg recently, only taking 0.25 milligrams every night, no significant improvement  He denies significant low back pain, no bowel and bladder incontinence, no bilateral fingertips paresthesia  UPDATE July 22 2016: Reviewed laboratory evaluation in April 2018, normal and negative ANA, C-reactive protein, homocystine level, folic acid, iron level,  ferritin was 157, thyroid function test, B12,  EMG nerve conduction study on May 31 2016 showed evidence of length dependent mild axonal sensorimotor polyneuropathy, there is no evidence of right lumbosacral radiculopathy.  Today he complains of intermittent sharp radiating pain at right lower extremity, despite polypharmacy at night time including gabapentin 300 mg 3 tablets at bedtime, seroquel 600 mg at bedtime,  taking Requip 88m, clonazepam 0.521m melatonin    he also complains of new onset right lower extremity pain radiating along the right calf when he first bear weight, improved after few seconds, tenderness of the right calf upon deep palpitation   UPDATE September 18 2016: He reported inconsistent response to oxycodone ER 10 mg every night, despite polypharmacy of seroquel 600 mg, oxycodone ER 10 mg, Requip 1 mg, clonazepam 0.5 milligrams, melatonin, gabapentin up to 900 mg every night, sometimes he still have difficulty sleeping, uncontrollable right leg jumping movement,  We have personally reviewed MRI lumbar in June 2018, multilevel degenerative disc disease, with L5-S1 moderate to severe foraminal narrowing,  He is going to have epidural injection by pain management Dr. PaClydell Hakimoon, he does complains of right hip pain, radiating pain to right calf, no bowel and bladder incontinence  Update November 26, 2017 His restless leg symptoms has much improved taking gabapentin 300 mg 1 in the morning, 2 at night, also Trileptal 150 mg twice a day, Requip 1 mg every night,  Today he complains 6 months history of worsening low back pain, bilateral anterior thigh and pain, right worse than left,  he i to pain management Dr. HaMaryjean Kataking Percocet 2 to 6 tablets each day  Had a repeat MRI of lumbar at [facility] neuro[facility] August 2019, overall stable multilevel degenerative changes, there was no significant foraminal narrowing, no evidence of nerve root compression,  He was noted  to have variable effort on examination today, right ankle weakness,  UPDATE Nov 28 2017: Patient return for electrodiagnostic study today, which showed much significant worsening in comparison to previous study in April 2018, EMG nerve conduction study April 2018 showed evidence of length dependent mild to moderate axonal sensorimotor polyneuropathy, at that time, selective needle examination of right lower extremity muscle showed no significant abnormality.  He complains of worsening gait abnormality and anterior thigh muscle achy pain, weakness over past 6 months,  EMG nerve conduction study today continue with evidence of length dependent sensorimotor polyneuropathy, but there is significant active neuropathic changes at selected bilateral lower extremity muscles, involving bilateral tibialis anterior, tibialis posterior, peroneal longus, medial gastrocnemius, milder degree bilateral vastus lateralis, and right biceps femoris short head,   MRI of lumbar at [place] [facility] in August 2019 showed stable degenerative changes, would not explain current findings, he does complains of decreased taste, decreased appetite, denies significant pelvic pain,  CPK and acetylcholine receptor antibodies was negative  UPDATE Jan 20 2018: I personally reviewed MRI of pelvic, enlarged prostate gland with median lobe hypertrophy impressing on the base of the bladder, possibly causing partial bladder outlet obstruction, bladder is moderately distended, moderate bilateral feet joint degenerative changes with no stress fracture.  He continues to complain right lower extremity deep achy pain despite taking gabapentin 300 mg 3 capsules daily, continue to have gait abnormality, right foot drop   REVIEW OF SYSTEMS: Out of a complete 14 system review of symptoms, the patient complains only of the following symptoms, and all other reviewed systems are negative. Aching muscles, weakness  ALLERGIES: No Known  Allergies  HOME MEDICATIONS: Outpatient Medications Prior to Visit  Medication Sig Dispense Refill  . finasteride (PROSCAR) 5 MG tablet Take 5 mg by mouth daily.      . FUROSEMIDE PO Take 10 mg by mouth daily.    Marland Kitchen gabapentin (NEURONTIN) 300 MG capsule TAKE 1 CAPSULE BY MOUTH IN THE AFTERNOON (4PM) AND 2 CAPSULES AT BEDTIME 270 capsule 3  . Melatonin 10 MG TABS Take 10 mg by mouth at bedtime as needed.    . Multiple Vitamins-Minerals (MULTIVITAMIN WITH MINERALS) tablet Take 1 tablet by mouth daily.    . OXcarbazepine (TRILEPTAL) 150 MG tablet TAKE 1 TABLET (150 MG TOTAL) BY MOUTH 2 (TWO) TIMES DAILY. 180 tablet 3  . oxyCODONE-acetaminophen (PERCOCET) 10-325 MG tablet Take 1 tablet by mouth every 4 (four) hours as needed for pain.    . pantoprazole (PROTONIX) 40 MG tablet Take 40 mg by mouth daily.      . QUEtiapine (SEROQUEL) 300 MG tablet Take 600 mg by mouth at bedtime. Reported on 05/10/2015    . rOPINIRole (REQUIP) 1 MG tablet TAKE 1 TABLET (1 MG TOTAL) BY MOUTH AT BEDTIME. 90 tablet 3  . TANDEM 162-115.2 MG CAPS capsule TAKE 1 CAPSULE BY MOUTH DAILY WITH BREAKFAST. 90 capsule 3   No facility-administered medications prior to visit.     PAST MEDICAL HISTORY: Past Medical History:  Diagnosis Date  . BPH (benign prostatic hyperplasia)   . Chronic combined systolic and diastolic congestive heart failure ([place])   . GERD (gastroesophageal reflux disease)    protonix for control  . Insomnia   . Neck pain   . Neuropathy  PAST SURGICAL HISTORY: Past Surgical History:  Procedure Laterality Date  . ANTERIOR CERVICAL DECOMPRESSION/DISCECTOMY FUSION 4 LEVELS N/A 10/27/2012   Procedure: Cervical Three-Four Cervical Four-Five Cervical Five-Six Cervical Six-Seven  Anterior cervical decompression/diskectomy/fusion;  Surgeon: Erline Levine, MD;  Location: [facility];  Service: Neurosurgery;  Laterality: N/A;  Cervical Three-Four Cervical Four-Five Cervical Five-Six Cervical Six-Seven  Anterior  cervical decompression/diskectomy/fusion  . SHOULDER ARTHROSCOPY  2010   rt shoulder  . SHOULDER ARTHROSCOPY  02/15/2011   Procedure: ARTHROSCOPY SHOULDER;  Surgeon: Cynda Familia;  Location: [facility];  Service: Orthopedics;  Laterality: Right;  Debridement  . TONSILLECTOMY      FAMILY HISTORY: Family History  Problem Relation Age of Onset  . Emphysema Mother   . Cirrhosis Father   . Stroke Brother     SOCIAL HISTORY: Social History   Socioeconomic History  . Marital status: Married    Spouse name: [name]  . Number of children: 2  . Years of education: college  . Highest education level: Not on file  Occupational History  . Occupation: retired  Scientific laboratory technician  . Financial resource strain: Not on file  . Food insecurity:    Worry: Not on file    Inability: Not on file  . Transportation needs:    Medical: Not on file    Non-medical: Not on file  Tobacco Use  . Smoking status: Former Smoker    Packs/day: 1.50    Last attempt to quit: 01/27/1990    Years since quitting: 28.0  . Smokeless tobacco: Never Used  Substance and Sexual Activity  . Alcohol use: No  . Drug use: No  . Sexual activity: Not on file  Lifestyle  . Physical activity:    Days per week: Not on file    Minutes per session: Not on file  . Stress: Not on file  Relationships  . Social connections:    Talks on phone: Not on file    Gets together: Not on file    Attends religious service: Not on file    Active member of club or organization: Not on file    Attends meetings of clubs or organizations: Not on file    Relationship status: Not on file  . Intimate partner violence:    Fear of current or ex partner: Not on file    Emotionally abused: Not on file    Physically abused: Not on file    Forced sexual activity: Not on file  Other Topics Concern  . Not on file  Social History Narrative   [name]   Pt is retired   Education college    Pt is  left handed   Pt consumes 2 cups of coffee daily      PHYSICAL EXAM  PHYSICAL EXAMNIATION:  Gen: NAD, conversant, well nourised, obese, well groomed                     Cardiovascular: Regular rate rhythm, no peripheral edema, warm, nontender. Eyes: Conjunctivae clear without exudates or hemorrhage Neck: Supple, no carotid bruits. Pulmonary: Clear to auscultation bilaterally   NEUROLOGICAL EXAM:  MENTAL STATUS: Speech:    Speech is normal; fluent and spontaneous with normal comprehension.  Cognition:     Orientation to time, place and person     Normal recent and remote memory     Normal Attention span and concentration     Normal Language, naming, repeating,spontaneous speech  Fund of knowledge   CRANIAL NERVES: CN II: Visual fields are full to confrontation.  Pupils are round equal and briskly reactive to light. CN III, IV, VI: extraocular movement are normal. No ptosis. CN V: Facial sensation is intact to pinprick in all 3 divisions bilaterally. Corneal responses are intact.  CN VII: Face is symmetric with normal eye closure and smile. CN VIII: Hearing is normal to rubbing fingers CN IX, X: Palate elevates symmetrically. Phonation is normal. CN XI: Head turning and shoulder shrug are intact CN XII: Tongue is midline with normal movements and no atrophy.  MOTOR: There is no significant upper extremity muscle weakness, he has mild bilateral hip flexion weakness, mild to moderate bilateral ankle dorsiflexion, toe flexion extension weakness, right worse than left.  REFLEXES: Reflexes are hypoactive and symmetric  SENSORY: Length dependent decreased to light touch, pinprick, to bilateral knee level  COORDINATION: Rapid alternating movements and fine finger movements are intact. There is no dysmetria on finger-to-nose and heel-knee-shin.    GAIT/STANCE: He needs pushed up to get up from seated position, wide-based, unsteady, right foot drop, could not walk on  heels,  DIAGNOSTIC DATA (LABS, IMAGING, TESTING) - I reviewed patient records, labs, notes, testing and imaging myself where available.  Lab Results  Component Value Date   WBC 5.2 02/11/2016   HGB 8.6 (L) 02/11/2016   HCT 24.4 (L) 02/11/2016   MCV 92.1 02/11/2016   PLT 157 02/11/2016      Component Value Date/Time   NA 132 (L) 02/11/2016 0153   NA 128 (L) 01/30/2015 0809   K 3.0 (L) 02/11/2016 0153   CL 100 (L) 02/11/2016 0153   CO2 24 02/11/2016 0153   GLUCOSE 100 (H) 02/11/2016 0153   BUN 5 (L) 02/11/2016 0153   BUN 18 01/30/2015 0809   CREATININE 0.83 02/11/2016 0153   CALCIUM 8.4 (L) 02/11/2016 0153   PROT 6.0 (L) 01/30/2016 1920   PROT 6.3 01/30/2015 0809   ALBUMIN 4.0 01/30/2016 1920   ALBUMIN 4.2 01/30/2015 0809   AST 22 01/30/2016 1920   ALT 15 (L) 01/30/2016 1920   ALKPHOS 71 01/30/2016 1920   BILITOT 0.8 01/30/2016 1920   BILITOT 0.4 01/30/2015 0809   GFRNONAA >60 02/11/2016 0153   GFRAA >60 02/11/2016 0153   Lab Results  Component Value Date   CHOL 143 01/23/2016   HDL 54 01/23/2016   LDLCALC 77 01/23/2016   TRIG 60 01/23/2016   CHOLHDL 2.6 01/23/2016   Lab Results  Component Value Date   HGBA1C 5.5 01/23/2016   Lab Results  Component Value Date   VITAMINB12 1,522 (H) 05/28/2016   Lab Results  Component Value Date   TSH 2.672 02/11/2016      ASSESSMENT AND PLAN 77 y.o. year old male   Peripheral neuropathy Restless leg symptoms  Overall under good control keep current dose of gabapentin 300 mg 1/2 Trileptal 150 mg twice a day, Requip 1 mg every day New onset bilateral anterior thigh knee pain, weakness,  CPK, acetylcholine receptor antibody was normal, no evidence of intrinsic muscle disease.  EMG nerve conduction study showed significantly worsening, evidence of active neuropathic changes involving bilateral L4-5 S1 myotomes, right worse than left, consistent with right lumbosacral radiculopathy with superimposed axonal sensorimotor  peripheral neuropathy,  Most recent repeat MRI of lumbar in August 2019 showed stable lumbar degenerative changes  MRI of the pelvic showed no evidence of lumbar sacral plexus compression  ESR C-reactive protein inflammatory process,  Doppler  study of right lower extremity.   Marcial Pacas, M.D. Ph.D.  [facility] [place], [place] [address] Phone: [phone] Fax:      [phone]

## 2018-01-27 ENCOUNTER — Ambulatory Visit: Payer: Medicare Other | Admitting: Neurology

## 2018-02-14 ENCOUNTER — Other Ambulatory Visit: Payer: Self-pay | Admitting: Neurology

## 2018-02-23 ENCOUNTER — Other Ambulatory Visit: Payer: Self-pay | Admitting: *Deleted

## 2018-02-23 ENCOUNTER — Telehealth: Payer: Self-pay | Admitting: Neurology

## 2018-02-23 DIAGNOSIS — R531 Weakness: Secondary | ICD-10-CM

## 2018-02-23 DIAGNOSIS — M79604 Pain in right leg: Secondary | ICD-10-CM

## 2018-02-23 NOTE — Telephone Encounter (Signed)
02/24/2018 °

## 2018-02-23 NOTE — Telephone Encounter (Signed)
Called and spoke to patient he is scheduled at [facility] for both his legs.

## 2018-02-23 NOTE — Telephone Encounter (Signed)
Pt requesting a call to discuss scheduling for his [place] for his legs. Please advise

## 2018-02-24 ENCOUNTER — Telehealth: Payer: Self-pay | Admitting: Neurology

## 2018-02-24 ENCOUNTER — Ambulatory Visit (HOSPITAL_COMMUNITY)
Admission: RE | Admit: 2018-02-24 | Discharge: 2018-02-24 | Disposition: A | Discharge status: Home / Self Care | Payer: Medicare Other | Source: Ambulatory Visit | Attending: Neurology | Admitting: Neurology

## 2018-02-24 DIAGNOSIS — M79604 Pain in right leg: Secondary | ICD-10-CM | POA: Insufficient documentation

## 2018-02-24 DIAGNOSIS — R531 Weakness: Secondary | ICD-10-CM | POA: Diagnosis present

## 2018-02-24 MED ORDER — GABAPENTIN 300 MG PO CAPS
600.0000 mg | ORAL_CAPSULE | Freq: Three times a day (TID) | ORAL | 3 refills | Status: DC
Start: 2018-02-24 — End: 2018-05-07

## 2018-02-24 MED ORDER — OXCARBAZEPINE 300 MG PO TABS: 300.0000 mg | ORAL_TABLET | Freq: Two times a day (BID) | ORAL | 3 refills | Status: DC

## 2018-02-24 NOTE — Telephone Encounter (Signed)
I called the patient.  The arterial Doppler studies were unremarkable, the patient is having worsening discomfort in the feet, he currently is on 600 mg of gabapentin 3 times a day with only modest benefit.  I will double the dose of the Trileptal going to 300 mg twice daily to see if we can help the pain in the feet.   Arterial Doppler of lower extremities 02/24/18:  Summary: Right: Resting right ankle-brachial index is within normal range. No evidence of significant right lower extremity arterial disease.  Left: Resting left ankle-brachial index is within normal range. No evidence of significant left lower extremity arterial disease.

## 2018-02-25 ENCOUNTER — Telehealth: Payer: Self-pay | Admitting: Neurology

## 2018-02-25 NOTE — Telephone Encounter (Signed)
The patient has not tried the increased dose of Trileptal yet.  This new prescription was sent in by Dr. Jannifer Franklin on 02/24/18 (as noted below).  He going to try this medication adjustment to see if his symptoms will improve.  He will call us back, if this change is not helpful.    Note from Dr. Benson Norway on 02/24/18.  He has already notified the patient of his results.  Kathrynn Ducking, MD  02/24/18 6:00 PM  Note    I called the patient. The arterial Doppler studies were unremarkable, the patient is having worsening discomfort in the feet, he currently is on 600 mg of gabapentin 3 times a day with only modest benefit. I will double the dose of the Trileptal going to 300 mg twice daily to see if we can help the pain in the feet.

## 2018-02-25 NOTE — Telephone Encounter (Signed)
Note from Dr. Benson Norway on 02/24/18.  He has already notified the patient of his results.  Kathrynn Ducking, MD  02/24/18 6:00 PM  Note    I called the patient.  The arterial Doppler studies were unremarkable, the patient is having worsening discomfort in the feet, he currently is on 600 mg of gabapentin 3 times a day with only modest benefit.  I will double the dose of the Trileptal going to 300 mg twice daily to see if we can help the pain in the feet.

## 2018-02-25 NOTE — Telephone Encounter (Signed)
Pt has called to inform that his restless leg is back and since this morning his arms and legs are moving about with no control.  Please call

## 2018-02-25 NOTE — Telephone Encounter (Signed)
Please call patient, doppler study of bilateral lower extremities are normal.    Summary: Right: Resting right ankle-brachial index is within normal range. No evidence of significant right lower extremity arterial disease.  Left: Resting left ankle-brachial index is within normal range. No evidence of significant left lower extremity arterial disease.

## 2018-05-07 ENCOUNTER — Telehealth (INDEPENDENT_AMBULATORY_CARE_PROVIDER_SITE_OTHER): Payer: Medicare Other | Admitting: Neurology

## 2018-05-07 DIAGNOSIS — G608 Other hereditary and idiopathic neuropathies: Secondary | ICD-10-CM

## 2018-05-07 DIAGNOSIS — G2581 Restless legs syndrome: Secondary | ICD-10-CM

## 2018-05-07 MED ORDER — DULOXETINE HCL 60 MG PO CPEP: 60.0000 mg | ORAL_CAPSULE | Freq: Every day | ORAL | 6 refills | Status: DC

## 2018-05-07 NOTE — Telephone Encounter (Signed)
Chief Complains/Reason for telephone visit:  Names of all people present and their role:  Relevant History: HISTORY Patient had long-standing history of mild peripheral neuropathy, has been taking Trileptal 150 mg twice a day for bilateral feet paresthesia, also taking Metanx, had a history of depression, on polypharmacy treatment, seroquel 300 mg at nighttime.  He was referred urgently back to our clinic by his primary care physician Dr. Dierdre Forth for worsening difficulty sleeping, urge to move his leg,  Most recent office visit with Dr. Mariea Clonts was on May 26 2016, I reviewed and summarized referring note, he had a history of esophageal reflux, esophagitis,  He complains of worsening restless leg symptoms, difficulty holding his leg still when relaxed, trying to go to sleep, he reported 48 hours without sleeping because of the leg discomfort, urge to move  He continues to complains bilateral feet numbness tingling needle prick, at the plantar surface, below the ankle,  He denies bowel and bladder incontinence, denies low back pain,   I reviewed the laboratory evaluation in December 2017, INR was 1.22, normal TSH, free T4, CBC showed anemia hemoglobin of 8.6, HCT of 24, BMP showed glucose of 100, calcium of 8.4, negative RPR,  He was diagnosed with iron deficiency anemia, has been treated with iron supplements since March 2018,  He tried gabapentin and Lyrica in the past, did not help, Lyrica made him feel funny,  I reviewed CT head on February 09 2016, no acute abnormality, mild generalized atrophy, supratentorium small vessel disease,  He was started on Requip 1 mg recently, only taking 0.25 milligrams every night, no significant improvement  He denies significant low back pain, no bowel and bladder incontinence, no bilateral fingertips paresthesia  UPDATE July 22 2016: Reviewed laboratory evaluation in April 2018, normal and negative ANA, C-reactive protein, homocystine  level, folic acid, iron level, ferritin was 157, thyroid function test, B12,  EMG nerve conduction study on May 31 2016 showed evidence of length dependent mild axonal sensorimotor polyneuropathy, there is no evidence of right lumbosacral radiculopathy.  Today he complains of intermittent sharp radiating pain at right lower extremity, despite polypharmacy at night time including gabapentin 300 mg 3 tablets at bedtime, seroquel 600 mg at bedtime, taking Requip 1mg , clonazepam 0.5mg , melatonin   he also complains of new onset right lower extremity pain radiating along the right calf when he first bear weight, improved after few seconds, tenderness of the right calf upon deep palpitation   UPDATE September 18 2016: Hereported inconsistent response tooxycodone ER10 mg every night, despite polypharmacy of seroquel 600 mg, oxycodone ER 10 mg, Requip 1 mg, clonazepam 0.5 milligrams, melatonin, gabapentin up to 900 mg every night, sometimes he still have difficulty sleeping, uncontrollable right leg jumping movement,  We have personally reviewed MRI lumbar in June 2018, multilevel degenerative disc disease, with L5-S1 moderate to severe foraminal narrowing,  He is going to have epidural injection by pain management Dr. Clydell Hakim soon, he does complains of right hip pain, radiating pain to right calf, no bowel and bladder incontinence  Update November 26, 2017 His restless leg symptoms has much improved taking gabapentin 300 mg 1 in the morning, 2 at night, also Trileptal 150 mg twice a day, Requip 1 mg every night,  Today he complains 6 months history of worsening low back pain, bilateral anterior thigh and pain, right worse than left,  he i to pain management Dr. Maryjean Ka, taking Percocet 2 to 6 tablets each day  Had a repeat  MRI of lumbar at [facility]surgery August 2019, overall stable multilevel degenerative changes, there was no significant foraminal narrowing, no evidence of nerve  root compression,  He was noted to have variable effort on examination today, right ankle weakness,  UPDATE Nov 28 2017: Patient return for electrodiagnostic study today, which showed much significant worsening in comparison to previous study in April 2018, EMG nerve conduction study April 2018 showed evidence of length dependent mild to moderate axonal sensorimotor polyneuropathy, at that time, selective needle examination of right lower extremity muscle showed no significant abnormality.  He complains of worsening gait abnormality and anterior thigh muscle achy pain, weakness over past 6 months,  EMG nerve conduction study today continue with evidence of length dependent sensorimotor polyneuropathy, but there is significant active neuropathic changes at selected bilateral lower extremity muscles, involving bilateral tibialis anterior, tibialis posterior, peroneal longus, medial gastrocnemius, milder degree bilateral vastus lateralis, and right biceps femoris short head,   MRI of lumbar at [place] [facility] in August 2019 showed stable degenerative changes, would not explain current findings, he does complains of decreased taste, decreased appetite, denies significant pelvic pain,  CPK and acetylcholine receptor antibodies was negative  UPDATE Jan 20 2018: I personally reviewed MRI of pelvic, enlarged prostate gland with median lobe hypertrophy impressing on the base of the bladder, possibly causing partial bladder outlet obstruction, bladder is moderately distended, moderate bilateral feet joint degenerative changes with no stress fracture.  He continues to complain right lower extremity deep achy pain despite taking gabapentin 300 mg 3 capsules daily, continue to have gait abnormality, right foot drop  Telephone Visit May 07 2018: He complains of bilateral feet pins and needle sensation, he increased his Gabapentin 300mg   2 tabs tid did not help, he has quit taking gabapentin,    he is under pain management, taking percocet 10/325 6 tabs a day, which has been helpful, but worried about the long-term side effect,  He is on polypharmacy treatment already, including Trileptal 300 mg 3 times daily, quetiapine 300 mg 2 tablets at nighttime, Requip 1 mg at bedtime   Results Reviewed: Doppler study of bilateral lower extremity showed no evidence of bilateral lower extremity significant arterial disease  Assessment and Plan: Peripheral neuropathy Restless leg symptoms  Worsening bilateral lower extremity paresthesia despite polypharmacy treatment,   He has stopped taking gabapentin, add on Cymbalta 60 mg daily, also on Trileptal 300 mg 3 times daily   Documentation of Time of Medical Discussion: Total phone time 12 minutes

## 2018-05-08 ENCOUNTER — Telehealth: Payer: Self-pay | Admitting: *Deleted

## 2018-05-08 NOTE — Telephone Encounter (Signed)
Called and spoke w/ pt letting him know appt 05/11/18 cx d/t concerns with covid-19. He spoke with Dr. Krista Blue on the phone yesterday. Advised we will f/u Monday about getting appt r/s. He verbalized understanding and appreciation.

## 2018-05-11 ENCOUNTER — Other Ambulatory Visit: Payer: Self-pay

## 2018-05-11 ENCOUNTER — Ambulatory Visit: Payer: Medicare Other | Admitting: Neurology

## 2018-05-11 ENCOUNTER — Telehealth (INDEPENDENT_AMBULATORY_CARE_PROVIDER_SITE_OTHER): Payer: Self-pay | Admitting: Neurology

## 2018-05-11 DIAGNOSIS — Z0289 Encounter for other administrative examinations: Secondary | ICD-10-CM

## 2018-05-19 ENCOUNTER — Telehealth: Payer: Self-pay | Admitting: Neurology

## 2018-05-19 NOTE — Telephone Encounter (Signed)
Dr. Krista Blue just prescribed this medication on 05/07/2018.  Per her vo, she would like him to try the medication a little longer prior to giving up on it.  She would like him to take it an entire month.  I called the patient and he is agreeable to this plan.

## 2018-05-19 NOTE — Telephone Encounter (Signed)
Pt called to inform that the DULoxetine (CYMBALTA) 60 MG capsule is not working for him. Please advise.

## 2018-05-31 ENCOUNTER — Other Ambulatory Visit: Payer: Self-pay | Admitting: Neurology

## 2018-06-02 NOTE — Telephone Encounter (Signed)
Pt called stating that the DULoxetine (CYMBALTA) 60 MG capsule is still not working for him. Please advise.

## 2018-06-02 NOTE — Telephone Encounter (Signed)
The patient has been taking duloxetine 60mg  daily for nearly a month now (started 05/07/2018).  He does not feel it is beneficial and would like to know if there is an alternative medication he can try.    Telephone visit on 05/07/2018:  Assessment and Plan: Peripheral neuropathy Restless leg symptoms             Worsening bilateral lower extremity paresthesia despite polypharmacy treatment,              He has stopped taking gabapentin, add on Cymbalta 60 mg daily, also on Trileptal 300 mg 3 times daily

## 2018-06-03 NOTE — Telephone Encounter (Addendum)
Fail to reach patient by both number listed, left message  Recent phone visit on March 19th 2020  He is on polypharmacy treatment already, including Trileptal 300 mg 3 times daily, quetiapine 300 mg 2 tablets at nighttime, Requip 1 mg at bedtime  Also under pain management taking percocet 10/325 6 tabs a day.  Previously has tried and failed gabapentin, Lyrica, epidural injection, requip, clonazepm, tripletpal, cymbalta, also on seroquel 600mg  qhs.  Please tell patient, we are limited on our choice of medications, if none of above medications has helped him. He can choose to stay on current medications, or tapering down the medications (cymbalta 60mg  first over 2 weeks, and then trileptal 300mg  once every night for 2 weeks, then stop).

## 2018-06-03 NOTE — Telephone Encounter (Addendum)
I reviewed the plan below with the patient.  He would rather speak with Dr. Krista Blue directly.  He did mention that he is only taking his Trileptal 300mg , one tablet BID rather than TID.  He can be reached at [phone].

## 2018-06-03 NOTE — Telephone Encounter (Signed)
I attempted to reach the patient again on both numbers.  I was able to leave a message on home number.  His voicemail was not set up on his mobile number.

## 2018-06-05 ENCOUNTER — Telehealth: Payer: Self-pay | Admitting: Neurology

## 2018-06-05 NOTE — Telephone Encounter (Signed)
Pt states the feeling of pins and needles have been getting worse. Pt would like a recommendation on what to do next to help out with this sensation. Please advise.

## 2018-06-08 NOTE — Telephone Encounter (Signed)
I called him  He is taking polypharmacy, percocet 10/325mg  6 tablets a day, he is getting Rx from his pain management Dr. Maryjean Ka.   cymbalta did not help, he has stop it,  trileptal 300mg  bid, will taper off in one week, He is on ropionirole 1mg  qhs Quietapine 300mg  2 every night Melatonin 10mg  qhs.

## 2018-06-26 ENCOUNTER — Other Ambulatory Visit: Payer: Self-pay | Admitting: Neurology

## 2018-07-06 ENCOUNTER — Other Ambulatory Visit: Payer: Self-pay | Admitting: Neurology

## 2018-07-15 IMAGING — MR MR HEAD W/O CM
9 of 11 series · 31 of 48 positions shown · non-contrast
Comparison: 04/22/2012 MRI of the brain.

CLINICAL DATA: 75 y/o  M; gait problem and slurred speech.

EXAM:
MRI HEAD WITHOUT CONTRAST
MRA HEAD WITHOUT CONTRAST
TECHNIQUE: Multiplanar, multiecho pulse sequences of the brain and surrounding
structures were obtained without intravenous contrast. Angiographic
images of the head were obtained using MRA technique without
contrast.

[Series 3: DWI · axial · 3.0mm · 1.09mm/px · z∈[+19,+143]mm · 6 of 94 slices shown (1 of 4)]
[im 1/94]
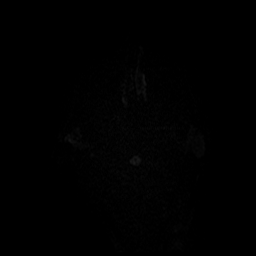
[im 19/94]
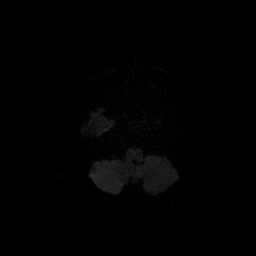
[im 38/94]
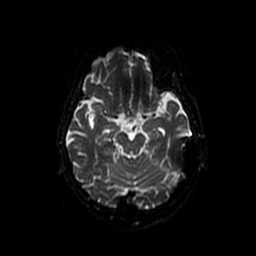
[im 56/94]
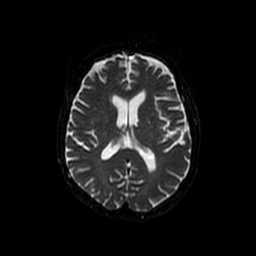
[im 75/94]
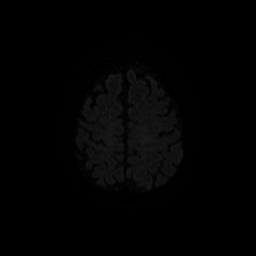
[im 94/94]
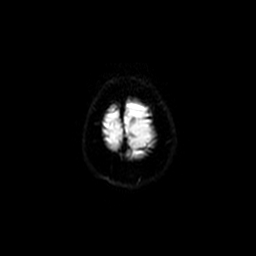

[Series 4: DWI · coronal · 5.0mm · 1.09mm/px · 5 of 72 slices shown (2 of 4)]
[im 1/72]
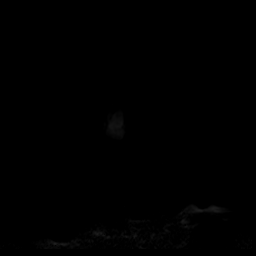
[im 18/72]
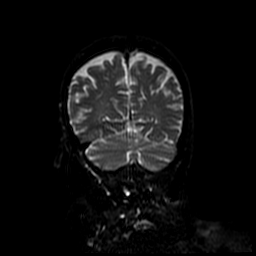
[im 36/72]
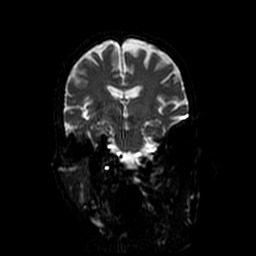
[im 54/72]
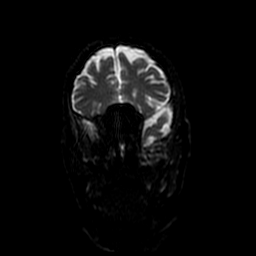
[im 72/72]
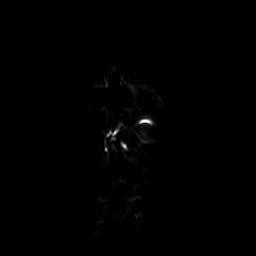

[Series 5: T1 · sagittal · 5.0mm · 0.47mm/px · 2 of 23 slices shown]
[im 1/23]
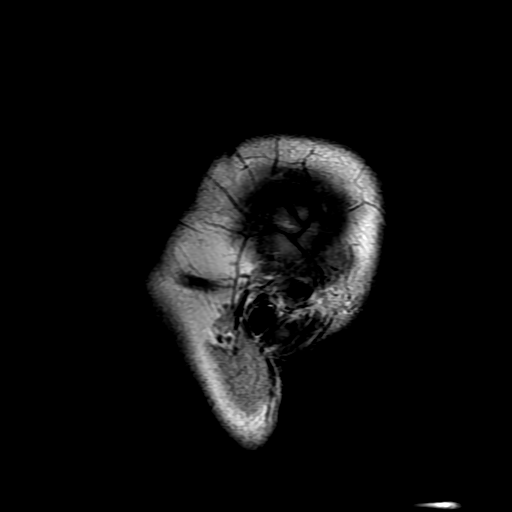
[im 23/23]
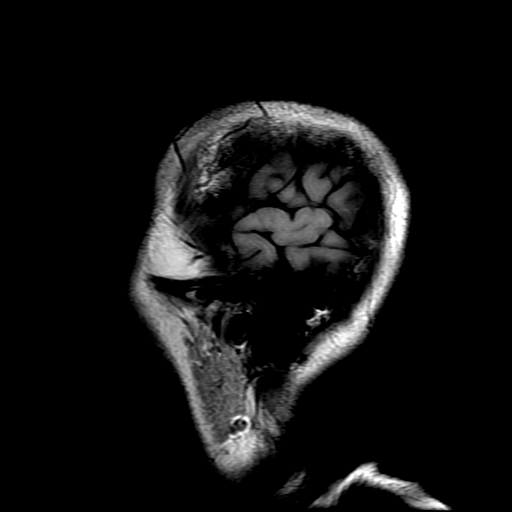

[Series 6: (id) mt fs · axial · 1.4mm · 0.43mm/px · z∈[-13,+42]mm · 5 of 154 slices shown]
[im 1/154]
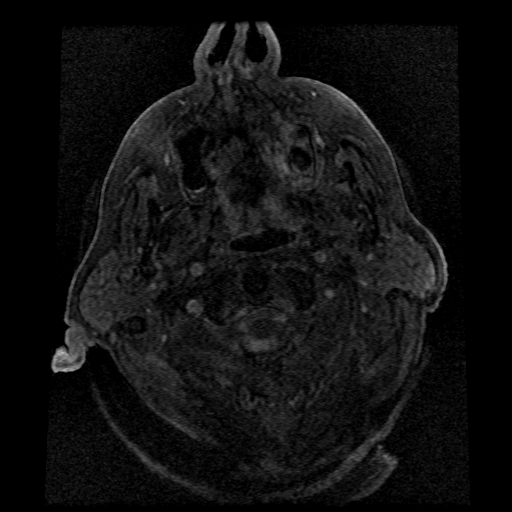
[im 28/154]
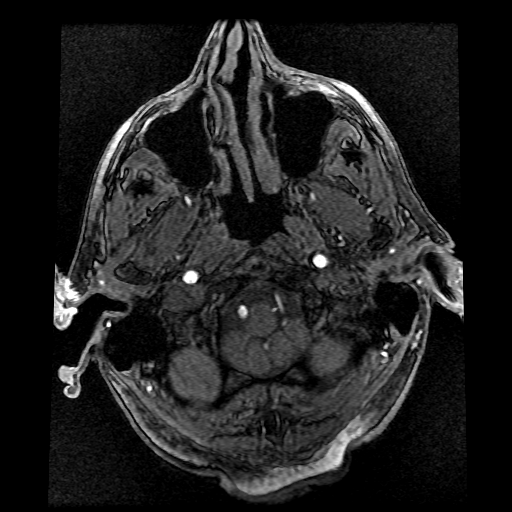
[im 42/154]
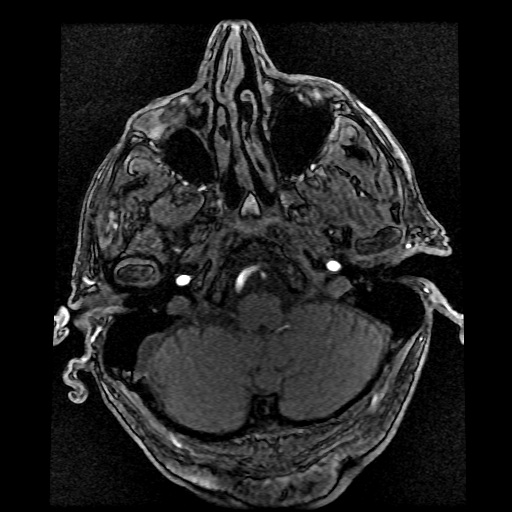
[im 70/154]
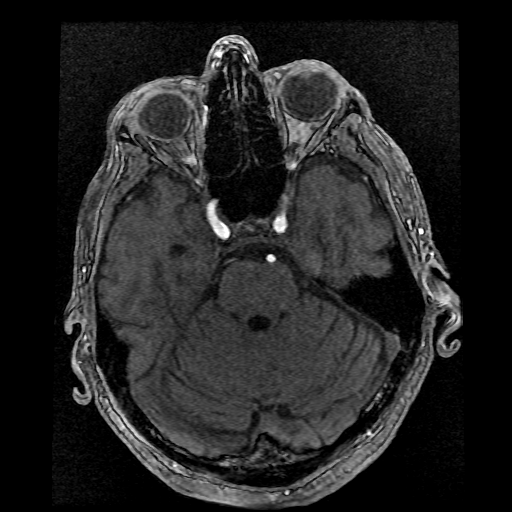
[im 84/154]
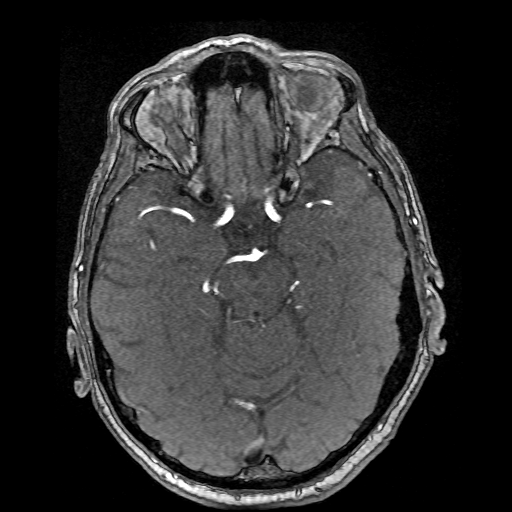

[Series 7: T2 · axial · 5.0mm · 0.47mm/px · z∈[-1,+134]mm · 2 of 26 slices shown (1 of 2)]
[im 1/26]
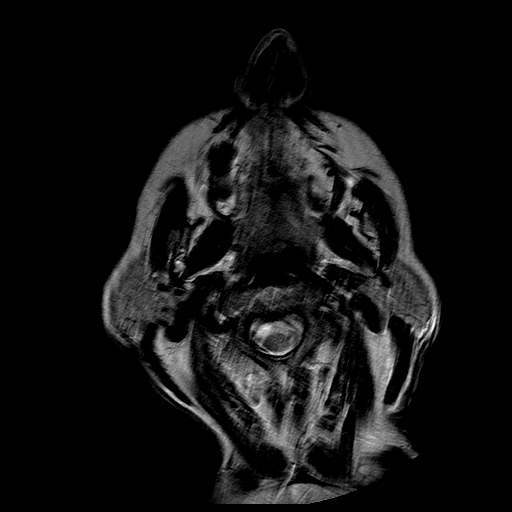
[im 26/26]
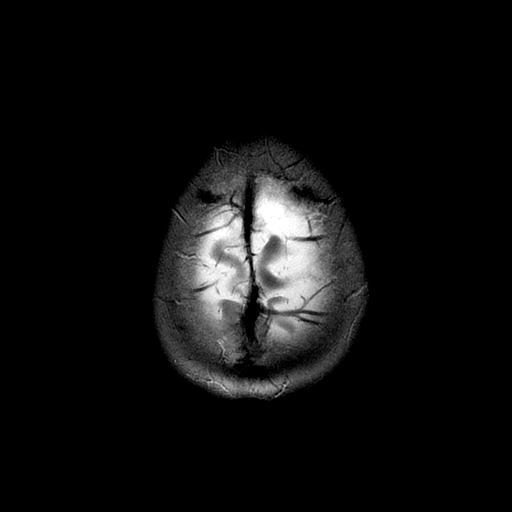

[Series 8: FLAIR · axial · 5.0mm · 0.47mm/px · z∈[-1,+134]mm · 2 of 26 slices shown]
[im 1/26]
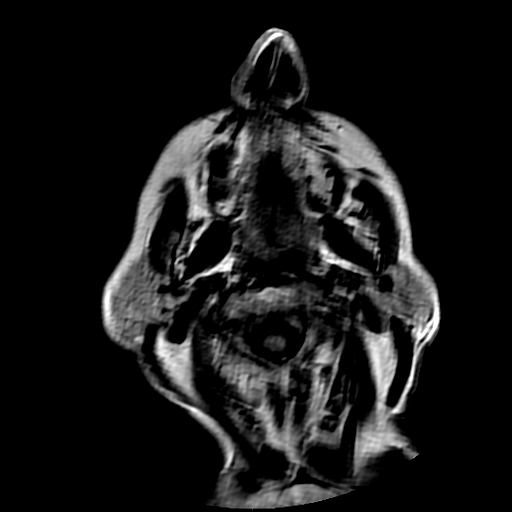
[im 26/26]
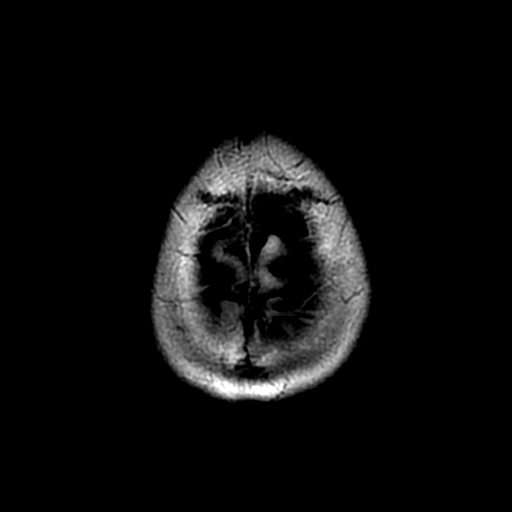

[Series 11: T2 · coronal · 5.0mm · 0.43mm/px · 2 of 30 slices shown (2 of 2)]
[im 1/30]
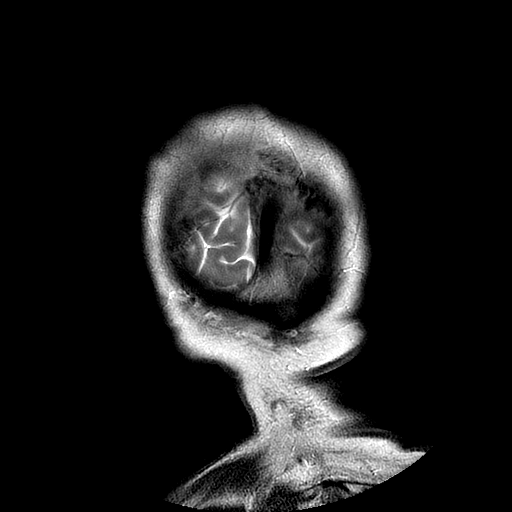
[im 30/30]
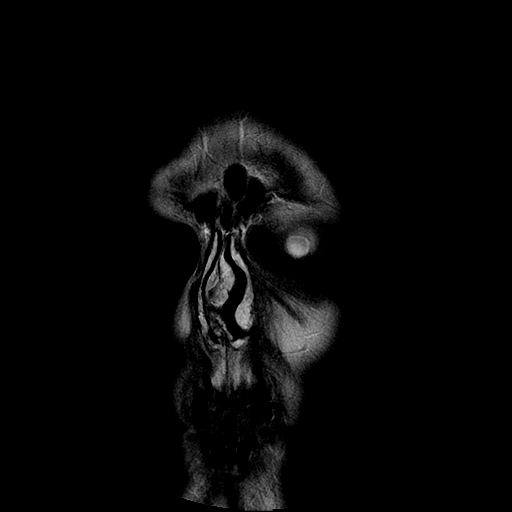

[Series 300: DWI · axial · 3.0mm · 1.09mm/px · z∈[+19,+143]mm · 4 of 47 slices shown (3 of 4)]
[im 1/47]
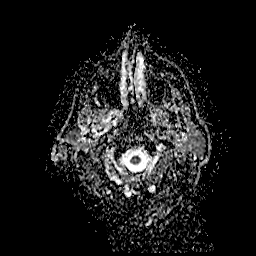
[im 16/47]
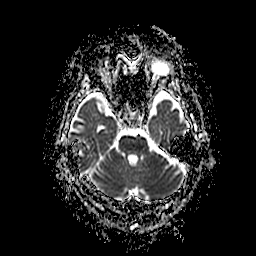
[im 31/47]
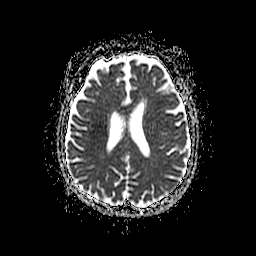
[im 47/47]
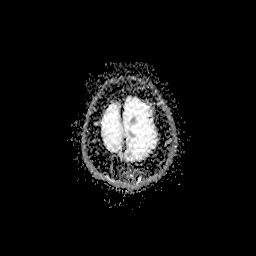

[Series 400: DWI · coronal · 5.0mm · 1.09mm/px · 3 of 36 slices shown (4 of 4)]
[im 1/36]
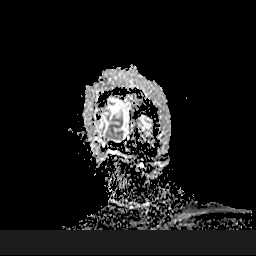
[im 18/36]
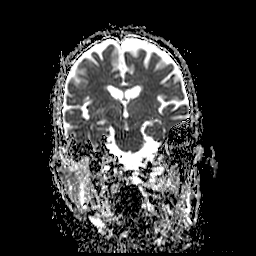
[im 36/36]
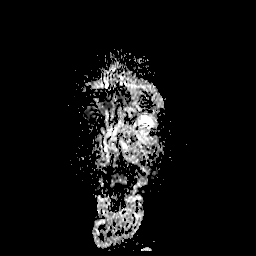

[31 of 48 positions shown; findings below may reference images not displayed]

FINDINGS: MRI HEAD FINDINGS

Brain: No acute infarction, hemorrhage, hydrocephalus, extra-axial
collection or mass lesion. Mild T2 FLAIR hyperintense signal
abnormality in periventricular white matter is compatible with mild
chronic microvascular ischemic changes. Mild brain parenchymal
volume loss.

Vascular: As below.

Skull and upper cervical spine: Normal marrow signal.

Sinuses/Orbits: Negative.

Other: None.

MRA HEAD FINDINGS

Internal carotid arteries:  Patent.

Anterior cerebral arteries:  Patent.

Middle cerebral arteries: Patent.

Anterior communicating artery: Patent.

Posterior communicating arteries:  Patent.

Posterior cerebral arteries: Patent. Asymmetry with less left PCA
distribution flow related signal relative to the right without
appreciable proximal obstructing lesion of uncertain significance,
possibly artifactual.

Basilar artery:  Patent.

Vertebral arteries:  Patent.  Right dominant vertebrobasilar system.

No evidence of high-grade stenosis, large vessel occlusion, or
aneurysm.
IMPRESSION: 1. No acute intracranial abnormality.
2. Mild chronic microvascular ischemic changes and parenchymal
volume loss of the brain.
3. Patent circle of Willis without evidence for high-grade stenosis,
large vessel occlusion, or aneurysm.
4. Asymmetric less distal left PCA distribution flow related signal
relative to the right PCA without appreciable proximal obstructing
lesion of uncertain significance, possibly anatomic variant.

By: Breck Rolle M.D.

## 2018-07-17 ENCOUNTER — Other Ambulatory Visit: Payer: Self-pay

## 2018-12-06 ENCOUNTER — Other Ambulatory Visit: Payer: Self-pay | Admitting: Neurology

## 2019-07-01 ENCOUNTER — Other Ambulatory Visit: Payer: Self-pay | Admitting: Neurology

## 2019-09-30 ENCOUNTER — Emergency Department (HOSPITAL_BASED_OUTPATIENT_CLINIC_OR_DEPARTMENT_OTHER)
Admission: EM | Admit: 2019-09-30 | Discharge: 2019-09-30 | Disposition: A | Discharge status: Home / Self Care | Payer: Medicare Other | Attending: Emergency Medicine | Admitting: Emergency Medicine

## 2019-09-30 ENCOUNTER — Encounter (HOSPITAL_BASED_OUTPATIENT_CLINIC_OR_DEPARTMENT_OTHER): Payer: Self-pay

## 2019-09-30 ENCOUNTER — Emergency Department (HOSPITAL_BASED_OUTPATIENT_CLINIC_OR_DEPARTMENT_OTHER): Payer: Medicare Other

## 2019-09-30 ENCOUNTER — Other Ambulatory Visit: Payer: Self-pay

## 2019-09-30 DIAGNOSIS — R339 Retention of urine, unspecified: Secondary | ICD-10-CM | POA: Insufficient documentation

## 2019-09-30 DIAGNOSIS — S9031XA Contusion of right foot, initial encounter: Secondary | ICD-10-CM | POA: Diagnosis not present

## 2019-09-30 DIAGNOSIS — Y999 Unspecified external cause status: Secondary | ICD-10-CM | POA: Insufficient documentation

## 2019-09-30 DIAGNOSIS — S92901A Unspecified fracture of right foot, initial encounter for closed fracture: Secondary | ICD-10-CM | POA: Diagnosis not present

## 2019-09-30 DIAGNOSIS — S99921A Unspecified injury of right foot, initial encounter: Secondary | ICD-10-CM | POA: Diagnosis present

## 2019-09-30 DIAGNOSIS — Y939 Activity, unspecified: Secondary | ICD-10-CM | POA: Insufficient documentation

## 2019-09-30 DIAGNOSIS — Z20822 Contact with and (suspected) exposure to covid-19: Secondary | ICD-10-CM | POA: Diagnosis not present

## 2019-09-30 DIAGNOSIS — Z79899 Other long term (current) drug therapy: Secondary | ICD-10-CM | POA: Diagnosis not present

## 2019-09-30 DIAGNOSIS — W19XXXA Unspecified fall, initial encounter: Secondary | ICD-10-CM | POA: Diagnosis not present

## 2019-09-30 DIAGNOSIS — R251 Tremor, unspecified: Secondary | ICD-10-CM | POA: Diagnosis not present

## 2019-09-30 DIAGNOSIS — Y92009 Unspecified place in unspecified non-institutional (private) residence as the place of occurrence of the external cause: Secondary | ICD-10-CM | POA: Insufficient documentation

## 2019-09-30 DIAGNOSIS — R509 Fever, unspecified: Secondary | ICD-10-CM

## 2019-09-30 DIAGNOSIS — I5042 Chronic combined systolic (congestive) and diastolic (congestive) heart failure: Secondary | ICD-10-CM | POA: Insufficient documentation

## 2019-09-30 DIAGNOSIS — Z87891 Personal history of nicotine dependence: Secondary | ICD-10-CM | POA: Insufficient documentation

## 2019-09-30 LAB — URINALYSIS, MICROSCOPIC (REFLEX): Squamous Epithelial / HPF: NONE SEEN (ref 0–5)

## 2019-09-30 LAB — COMPREHENSIVE METABOLIC PANEL
ALT: 16 U/L (ref 0–44)
AST: 28 U/L (ref 15–41)
Albumin: 4.2 g/dL (ref 3.5–5.0)
Alkaline Phosphatase: 69 U/L (ref 38–126)
Anion gap: 9 (ref 5–15)
BUN: 15 mg/dL (ref 8–23)
CO2: 28 mmol/L (ref 22–32)
Calcium: 9.1 mg/dL (ref 8.9–10.3)
Chloride: 99 mmol/L (ref 98–111)
Creatinine, Ser: 1.36 mg/dL — ABNORMAL HIGH (ref 0.61–1.24)
GFR calc Af Amer: 57 mL/min — ABNORMAL LOW (ref >60)
GFR calc non Af Amer: 49 mL/min — ABNORMAL LOW (ref >60)
Glucose, Bld: 136 mg/dL — ABNORMAL HIGH (ref 70–99)
Potassium: 4.6 mmol/L (ref 3.5–5.1)
Sodium: 136 mmol/L (ref 135–145)
Total Bilirubin: 0.7 mg/dL (ref 0.3–1.2)
Total Protein: 7.1 g/dL (ref 6.5–8.1)

## 2019-09-30 LAB — CBC WITH DIFFERENTIAL/PLATELET
Abs Immature Granulocytes: 0.04 10*3/uL (ref 0.00–0.07)
Basophils Absolute: 0 10*3/uL (ref 0.0–0.1)
Basophils Relative: 0 %
Eosinophils Absolute: 0.1 10*3/uL (ref 0.0–0.5)
Eosinophils Relative: 1 %
HCT: 36.4 % — ABNORMAL LOW (ref 39.0–52.0)
Hemoglobin: 12.1 g/dL — ABNORMAL LOW (ref 13.0–17.0)
Immature Granulocytes: 0 %
Lymphocytes Relative: 9 %
Lymphs Abs: 0.8 10*3/uL (ref 0.7–4.0)
MCH: 30.2 pg (ref 26.0–34.0)
MCHC: 33.2 g/dL (ref 30.0–36.0)
MCV: 90.8 fL (ref 80.0–100.0)
Monocytes Absolute: 0.6 10*3/uL (ref 0.1–1.0)
Monocytes Relative: 7 %
Neutro Abs: 7.4 10*3/uL (ref 1.7–7.7)
Neutrophils Relative %: 83 %
Platelets: 143 10*3/uL — ABNORMAL LOW (ref 150–400)
RBC: 4.01 MIL/uL — ABNORMAL LOW (ref 4.22–5.81)
RDW: 12.5 % (ref 11.5–15.5)
WBC: 9 10*3/uL (ref 4.0–10.5)
nRBC: 0 % (ref 0.0–0.2)

## 2019-09-30 LAB — URINALYSIS, ROUTINE W REFLEX MICROSCOPIC
Bilirubin Urine: NEGATIVE
Glucose, UA: NEGATIVE mg/dL
Ketones, ur: NEGATIVE mg/dL
Leukocytes,Ua: NEGATIVE
Nitrite: NEGATIVE
Protein, ur: NEGATIVE mg/dL
Specific Gravity, Urine: 1.015 (ref 1.005–1.030)
pH: 6.5 (ref 5.0–8.0)

## 2019-09-30 LAB — SARS CORONAVIRUS 2 BY RT PCR (HOSPITAL ORDER, PERFORMED IN ~~LOC~~ HOSPITAL LAB): SARS Coronavirus 2: NEGATIVE

## 2019-09-30 LAB — PROTIME-INR
INR: 1 (ref 0.8–1.2)
Prothrombin Time: 12.6 seconds (ref 11.4–15.2)

## 2019-09-30 LAB — LACTIC ACID, PLASMA: Lactic Acid, Venous: 1.3 mmol/L (ref 0.5–1.9)

## 2019-09-30 MED ORDER — ACETAMINOPHEN 325 MG PO TABS
650.0000 mg | ORAL_TABLET | Freq: Once | ORAL | Status: AC | PRN
  Administered 2019-09-30: 650 mg via ORAL
  Filled 2019-09-30: qty 2

## 2019-09-30 MED ORDER — SODIUM CHLORIDE 0.9 % IV SOLN
INTRAVENOUS | Status: AC
  Administered 2019-09-30: 2 g via INTRAVENOUS
  Filled 2019-09-30: qty 2

## 2019-09-30 MED ORDER — SODIUM CHLORIDE 0.9 % IV SOLN: 2.0000 g | Freq: Once | INTRAVENOUS | Status: AC

## 2019-09-30 MED ORDER — ACETAMINOPHEN 325 MG PO TABS
650.0000 mg | ORAL_TABLET | Freq: Once | ORAL | Status: DC
Start: 2019-09-30 — End: 2019-09-30

## 2019-09-30 MED ORDER — SODIUM CHLORIDE 0.9 % IV SOLN
1.0000 g | Freq: Once | INTRAVENOUS | Status: DC
  Filled 2019-09-30: qty 1

## 2019-09-30 MED ORDER — SODIUM CHLORIDE 0.9 % IV BOLUS
500.0000 mL | Freq: Once | INTRAVENOUS | Status: AC
  Administered 2019-09-30: 500 mL via INTRAVENOUS

## 2019-09-30 NOTE — ED Notes (Signed)
Boot and crutches in room, need to convert pt to leg bag for foley and get dressed before applying CAM boot (If going home)

## 2019-09-30 NOTE — ED Notes (Signed)
Son Zakee Deerman updated on pt care [phone]

## 2019-09-30 NOTE — ED Notes (Signed)
Pt attempted to collect urine sample, unable to obtain, HR 130 when standing ED MD made aware.

## 2019-09-30 NOTE — ED Notes (Signed)
ED Provider at bedside. 

## 2019-09-30 NOTE — Discharge Instructions (Signed)
It is important for you to follow-up with your urologist regarding the Foley catheter that we have placed.  You you were retaining urine prior to placement. Your x-ray shows that you have a fracture of your right foot.  You will need to follow-up with the orthopedist listed below.  You will need to use crutches and not bear weight on your leg. Follow-up with your primary care provider. We will contact you with the results of your blood cultures when they are available. Return to the ER for additional injuries or falls, if you develop abdominal pain, chest pain, shortness of breath, vomiting or additional injuries.

## 2019-09-30 NOTE — ED Triage Notes (Addendum)
[facility] report-pt with hx of tremors-tremors caused pt to fall this am-pt with skin tear to right elbow-no head/neck/back pain/injury-pt from stretcher to w/c to ED WR upon arrival-pt states tremors started this am-no hx until today-pt with involuntary movement/sligh jerking bilat LE and UE-states he was taking oxycodone and his doctor weaning him from pain med and is being seen by pain management with new meds x 1 month-pt NAD-to triage in w/c-taped dsg to right elbow skin tear removed-saline 4x4/kling wet/dry dsg applied

## 2019-09-30 NOTE — ED Provider Notes (Signed)
[facility] EMERGENCY DEPARTMENT Provider Note   CSN: [id number] Arrival date & time: 09/30/19  1145     History Chief Complaint  Patient presents with  . Tremors  . Fall  . Fever    Dustin [name] is a 79 y.o. male with a past medical history of BPH, GERD presenting to the ED with a chief complaint of tremors.  States that he woke up this morning with tremors to bilateral lower extremities.  States that because of these tremors, he felt "off balance" when ambulating in his home.  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  He ambulated to get something for his wife when he fell.  States that he injured his right foot.  He was able to get up and ambulate again, several minutes later fell again.  He denies any head injury or loss of consciousness of for either of these falls.  He was in his usual state of health when he went to sleep last night.  Denies any headache, dizziness, lightheadedness, blurry vision.  No numbness in arms or legs.  No history of stroke or anticoagulant use.  Denies any dysuria, vomiting, diarrhea.  He has been vaccinated against Covid, denies any fevers. He does have a history of BPH, last saw his urologist last month and had a procedure done he does not remember what the procedure was.  He sees his urologist annually.  HPI     Past Medical History:  Diagnosis Date  . BPH (benign prostatic hyperplasia)   . Chronic combined systolic and diastolic congestive heart failure ([place])   . GERD (gastroesophageal reflux disease)    protonix for control  . Insomnia   . Neck pain   . Neuropathy     Patient Active Problem List   Diagnosis Date Noted  . New onset headache 01/20/2018  . Muscle weakness (generalized) 11/28/2017  . Weakness 11/26/2017  . Right lumbar radiculopathy 09/18/2016  . Right leg pain 07/22/2016  . Restless leg syndrome 05/28/2016  . Iron deficiency anemia 05/28/2016  . Fall 02/10/2016  . Rib fracture 02/10/2016  . Acute  encephalopathy 02/10/2016  . Chronic combined systolic and diastolic congestive heart failure ([place])   . Ataxia   . Hyponatremia 01/22/2016  . Gait instability 01/22/2016  . Urinary retention   . Stroke-like symptom   . Syncope 05/02/2015  . Closed left fibular fracture 05/02/2015  . GERD (gastroesophageal reflux disease) 05/02/2015  . BPH (benign prostatic hyperplasia) 05/02/2015  . Fracture of distal end of left fibula   . 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 Depression 01/30/2015  . 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 ANTERIOR CERVICAL DECOMPRESSION/DISCECTOMY FUSION 4 LEVELS N/A 10/27/2012   Procedure: Cervical Three-Four Cervical Four-Five Cervical Five-Six Cervical Six-Seven  Anterior cervical decompression/diskectomy/fusion;  Surgeon: Erline Levine, MD;  Location: [facility];  Service: Neurosurgery;  Laterality: N/A;  Cervical Three-Four Cervical Four-Five Cervical Five-Six Cervical Six-Seven  Anterior cervical decompression/diskectomy/fusion  . SHOULDER ARTHROSCOPY  2010   rt shoulder  . SHOULDER ARTHROSCOPY  02/15/2011   Procedure: ARTHROSCOPY SHOULDER;  Surgeon: Cynda Familia;  Location: [facility];  Service: Orthopedics;  Laterality: Right;  Debridement  . TONSILLECTOMY         Family History  Problem Relation Age of Onset  . Emphysema Mother   . Cirrhosis Father   . Stroke Brother     Social History   Tobacco  Use  . Smoking status: Former Smoker    Packs/day: 1.50    Quit date: 01/27/1990    Years since quitting: 29.6  . Smokeless tobacco: Never Used  Vaping Use  . Vaping Use: Never used  Substance Use Topics  . Alcohol use: No  . Drug use: No    Home Medications Prior to Admission medications   Medication Sig Start Date End Date Taking? Authorizing Provider  finasteride (PROSCAR) 5 MG tablet Take 5 mg by mouth daily.      [provider]  FUROSEMIDE PO Take 10 mg by  mouth daily.    [provider]  Melatonin 10 MG TABS Take 10 mg by mouth at bedtime as needed.    [provider]  Multiple Vitamins-Minerals (MULTIVITAMIN WITH MINERALS) tablet Take 1 tablet by mouth daily.    [provider]  Oxcarbazepine (TRILEPTAL) 300 MG tablet TAKE 1 TABLET BY MOUTH TWICE A DAY 06/01/18   Marcial Pacas, MD  oxyCODONE-acetaminophen (PERCOCET) 10-325 MG tablet Take 1 tablet by mouth every 4 (four) hours as needed for pain.    [provider]  pantoprazole (PROTONIX) 40 MG tablet Take 40 mg by mouth daily.      [provider]  QUEtiapine (SEROQUEL) 300 MG tablet Take 600 mg by mouth at bedtime. Reported on 05/10/2015    [provider]  rOPINIRole (REQUIP) 1 MG tablet TAKE 1 TABLET (1 MG TOTAL) BY MOUTH AT BEDTIME. 12/15/17   Marcial Pacas, MD  TANDEM 162-115.2 MG CAPS capsule TAKE 1 CAPSULE BY MOUTH DAILY WITH BREAKFAST. 07/06/18   Marcial Pacas, MD    Allergies    Patient has no known allergies.  Review of Systems   Review of Systems  Constitutional: Negative for appetite change, chills and fever.  HENT: Negative for ear pain, rhinorrhea, sneezing and sore throat.   Eyes: Negative for photophobia and visual disturbance.  Respiratory: Negative for cough, chest tightness, shortness of breath and wheezing.   Cardiovascular: Negative for chest pain and palpitations.  Gastrointestinal: Negative for abdominal pain, blood in stool, constipation, diarrhea, nausea and vomiting.  Genitourinary: Negative for dysuria, hematuria and urgency.  Musculoskeletal: Negative for myalgias.  Skin: Negative for rash.  Neurological: Positive for tremors. Negative for dizziness, weakness and light-headedness.    Physical Exam Updated Vital Signs BP (!) 133/100   Pulse 96   Temp 99.2 F (37.3 C) (Oral)   Resp 11   Ht 6\' 2"  (1.88 m)   Wt 86.2 kg   SpO2 91%   BMI 24.39 kg/m   Physical Exam Vitals and nursing note reviewed.    Constitutional:      General: He is not in acute distress.    Appearance: He is well-developed.  HENT:     Head: Normocephalic and atraumatic.     Nose: Nose normal.  Eyes:     General: No scleral icterus.       Right eye: No discharge.        Left eye: No discharge.     Conjunctiva/sclera: Conjunctivae normal.     Pupils: Pupils are equal, round, and reactive to light.  Cardiovascular:     Rate and Rhythm: Normal rate and regular rhythm.     Heart sounds: Normal heart sounds. No murmur heard.  No friction rub. No gallop.   Pulmonary:     Effort: Pulmonary effort is normal. No respiratory distress.     Breath sounds: Normal breath sounds.  Abdominal:  General: Bowel sounds are normal. There is no distension.     Palpations: Abdomen is soft.     Tenderness: There is no abdominal tenderness. There is no guarding.  Musculoskeletal:        General: Swelling and tenderness present. Normal range of motion.     Cervical back: Normal range of motion and neck supple.       Feet:     Comments: Some bruising and swelling noted of the right foot in the indicated area.  2+ DP pulses noted bilaterally.  Normal range of motion of digits of bilateral feet.  Skin:    General: Skin is warm and dry.     Findings: No rash.  Neurological:     General: No focal deficit present.     Mental Status: He is alert and oriented to person, place, and time.     Cranial Nerves: No cranial nerve deficit.     Sensory: No sensory deficit.     Motor: No weakness or abnormal muscle tone.     Coordination: Coordination normal.     Comments: No tremors noted. Pupils reactive. No facial asymmetry noted. Cranial nerves appear grossly intact. Sensation intact to light touch on face, BUE and BLE. Strength 5/5 in BUE and BLE.      ED Results / Procedures / Treatments   Labs (all labs ordered are listed, but only abnormal results are displayed) Labs Reviewed  COMPREHENSIVE METABOLIC PANEL - Abnormal; Notable  for the following components:      Result Value   Glucose, Bld 136 (*)    Creatinine, Ser 1.36 (*)    GFR calc non Af Amer 49 (*)    GFR calc Af Amer 57 (*)    All other components within normal limits  CBC WITH DIFFERENTIAL/PLATELET - Abnormal; Notable for the following components:   RBC 4.01 (*)    Hemoglobin 12.1 (*)    HCT 36.4 (*)    Platelets 143 (*)    All other components within normal limits  URINALYSIS, ROUTINE W REFLEX MICROSCOPIC - Abnormal; Notable for the following components:   Hgb urine dipstick SMALL (*)    All other components within normal limits  URINALYSIS, MICROSCOPIC (REFLEX) - Abnormal; Notable for the following components:   Bacteria, UA RARE (*)    All other components within normal limits  SARS CORONAVIRUS 2 BY RT PCR (HOSPITAL ORDER, [facility])  CULTURE, BLOOD (ROUTINE X 2)  CULTURE, BLOOD (ROUTINE X 2)  URINE CULTURE  LACTIC ACID, PLASMA  PROTIME-INR    EKG EKG Interpretation  Date/Time:  Thursday September 30 2019 13:25:32 EDT Ventricular Rate:  107 PR Interval:    QRS Duration: 83 QT Interval:  318 QTC Calculation: 425 R Axis:   35 Text Interpretation: Sinus tachycardia Since last tracing rate faster Confirmed by Wandra Arthurs [phone]) on 09/30/2019 3:44:54 PM   Radiology DG Ankle Complete Right  Result Date: 09/30/2019 CLINICAL DATA:  Fall, pain, bruising EXAM: RIGHT ANKLE - COMPLETE 3+ VIEW COMPARISON:  Foot series today FINDINGS: Plantar calcaneal spur. No fracture, subluxation or dislocation at the right ankle. Previously seen foot fractures not visualized on this ankle series. IMPRESSION: No acute bony abnormality in the right ankle. Electronically Signed   By: Rolm Baptise M.D.   On: 09/30/2019 15:25   DG Chest Port 1 View  Result Date: 09/30/2019 CLINICAL DATA:  Tremors, fall, fever. EXAM: PORTABLE CHEST 1 VIEW COMPARISON:  Chest x-rays dated  12/30/2017 and 02/06/2016. FINDINGS: Heart size and mediastinal  contours are within normal limits. Lungs are hyperexpanded. Lungs are clear. No pleural effusion or pneumothorax is seen. IMPRESSION: 1. No acute findings. No evidence of pneumonia or pulmonary edema. 2. Hyperexpanded lungs indicating COPD. Electronically Signed   By: Franki Cabot M.D.   On: 09/30/2019 13:25   DG Foot Complete Right  Result Date: 09/30/2019 CLINICAL DATA:  Fall, swelling, pain second through 4th toes and metatarsals. EXAM: RIGHT FOOT COMPLETE - 3+ VIEW COMPARISON:  None. FINDINGS: Fractures are noted at the base of the 1st through 4th metatarsals. Minimal displacement. Fractures are also noted through the bases of the 2nd and 3rd proximal phalanges. No subluxation or dislocation. Soft tissues are intact. IMPRESSION: Fracture through the bases of the 1st through 4th metatarsals. Fracture through the bases of the 2nd and 3rd proximal phalanges. Electronically Signed   By: Rolm Baptise M.D.   On: 09/30/2019 15:24    Procedures Procedures (including critical care time)  Medications Ordered in ED Medications  acetaminophen (TYLENOL) tablet 650 mg (650 mg Oral Given 09/30/19 1234)  sodium chloride 0.9 % bolus 500 mL (500 mLs Intravenous New Bag/Given 09/30/19 1450)  ceFEPIme (MAXIPIME) 2 g in sodium chloride 0.9 % 100 mL IVPB (2 g Intravenous New Bag/Given 09/30/19 1635)    ED Course  I have reviewed the triage vital signs and the nursing notes.  Pertinent labs & imaging results that were available during my care of the patient were reviewed by me and considered in my medical decision making (see chart for details).  Clinical Course as of Sep 29 1798  Thu Sep 30, 2019  1617 Will insert Foley catheter.   [HK]  1627 Spoke to Dr. Lucia Gaskins, orthopedist regarding patient's Lisfranc fracture. He recommends that he will need to be placed in a boot, nonweightbearing and follow-up in the office.   [HK]    Clinical Course User Index [HK] Delia Heady, PA-C   MDM Rules/Calculators/A&P                           79 year old male with past medical history of BPH, GERD presenting to the ED with a chief complaint of tremors and feeling "off balance."  Symptoms occurred when he woke up this morning.  [place] because he tried to ambulate while feeling off balance.  Reports pain to his right foot since his fall.  On exam there is some tenderness of the right midfoot.  Areas neurovascularly intact.  He denies any headache, blurry vision, chest pain, abdominal pain, vomiting, diarrhea, cough or other infectious symptoms.  He was febrile here to 103 this improved with Tylenol.  Abdomen is soft, nontender nondistended.  No neurological deficits noted, no tremors noted on my exam.  Work-up significant for creatinine of 1.39 which is elevated from what appears to be at baseline of 0.9.  Urinalysis without signs of infection although he did have urinary retention, Foley catheter was placed with about 1 L of urine.  He denies history of similar symptoms in the past.  EKG shows sinus tachycardia.  No changes from prior tracings other than rate is faster.  Chest x-ray is unremarkable.  Urine sent for culture and blood cultures were obtained.  His lactic acid level is normal, he does not have leukocytosis.  I am unsure what the fever is from but I doubt intra-abdominal cause as he remains in no acute distress with benign abdominal exam.  Blood cultures were obtained so unsure if this could be due to bacteremia.  Will treat with boot for his foot fracture, crutches.  He was given cefepime here.  No indication for antibiotics at home.  Foley catheter placed and he will need to follow-up with urology regarding this.  Patient is agreeable to the plan and is requesting discharge home. Patient discussed with and seen by the attending, Dr. Darl Householder.   Patient is hemodynamically stable, in NAD, and able to ambulate in the ED. Evaluation does not show pathology that would require ongoing emergent intervention or inpatient  treatment. I explained the diagnosis to the patient. Pain has been managed and has no complaints prior to discharge. Patient is comfortable with above plan and is stable for discharge at this time. All questions were answered prior to disposition. Strict return precautions for returning to the ED were discussed. Encouraged follow up with PCP.   An After Visit Summary was printed and given to the patient.   Portions of this note were generated with [employer]. Dictation errors may occur despite best attempts at proofreading.  Final Clinical Impression(s) / ED Diagnoses Final diagnoses:  Closed fracture of right foot, initial encounter  Urinary retention    Rx / DC Orders ED Discharge Orders    None       Delia Heady, PA-C 09/30/19 1800    Drenda Freeze, MD 10/01/19 2102

## 2019-10-01 LAB — URINE CULTURE: Culture: NO GROWTH

## 2019-10-05 LAB — CULTURE, BLOOD (ROUTINE X 2)
Culture: NO GROWTH
Culture: NO GROWTH
Special Requests: ADEQUATE

## 2019-10-07 ENCOUNTER — Other Ambulatory Visit: Payer: Self-pay

## 2019-10-07 ENCOUNTER — Emergency Department (HOSPITAL_COMMUNITY): Payer: Medicare Other

## 2019-10-07 ENCOUNTER — Encounter (HOSPITAL_COMMUNITY): Payer: Self-pay | Admitting: Student

## 2019-10-07 ENCOUNTER — Inpatient Hospital Stay (HOSPITAL_COMMUNITY)
Admission: EM | Admit: 2019-10-07 | Discharge: 2019-10-10 | DRG: 871 | Disposition: A | Discharge status: Home Health | Payer: Medicare Other | Attending: Internal Medicine | Admitting: Internal Medicine

## 2019-10-07 ENCOUNTER — Inpatient Hospital Stay (HOSPITAL_COMMUNITY): Payer: Medicare Other

## 2019-10-07 DIAGNOSIS — F329 Major depressive disorder, single episode, unspecified: Secondary | ICD-10-CM | POA: Diagnosis present

## 2019-10-07 DIAGNOSIS — Z823 Family history of stroke: Secondary | ICD-10-CM

## 2019-10-07 DIAGNOSIS — G2581 Restless legs syndrome: Secondary | ICD-10-CM | POA: Diagnosis present

## 2019-10-07 DIAGNOSIS — N401 Enlarged prostate with lower urinary tract symptoms: Secondary | ICD-10-CM | POA: Diagnosis present

## 2019-10-07 DIAGNOSIS — L0390 Cellulitis, unspecified: Secondary | ICD-10-CM | POA: Diagnosis present

## 2019-10-07 DIAGNOSIS — G8929 Other chronic pain: Secondary | ICD-10-CM | POA: Diagnosis present

## 2019-10-07 DIAGNOSIS — D638 Anemia in other chronic diseases classified elsewhere: Secondary | ICD-10-CM | POA: Diagnosis present

## 2019-10-07 DIAGNOSIS — Z981 Arthrodesis status: Secondary | ICD-10-CM | POA: Diagnosis not present

## 2019-10-07 DIAGNOSIS — A419 Sepsis, unspecified organism: Secondary | ICD-10-CM | POA: Diagnosis not present

## 2019-10-07 DIAGNOSIS — Z66 Do not resuscitate: Secondary | ICD-10-CM

## 2019-10-07 DIAGNOSIS — S92343D Displaced fracture of fourth metatarsal bone, unspecified foot, subsequent encounter for fracture with routine healing: Secondary | ICD-10-CM

## 2019-10-07 DIAGNOSIS — Z825 Family history of asthma and other chronic lower respiratory diseases: Secondary | ICD-10-CM

## 2019-10-07 DIAGNOSIS — Z87891 Personal history of nicotine dependence: Secondary | ICD-10-CM | POA: Diagnosis not present

## 2019-10-07 DIAGNOSIS — G92 Toxic encephalopathy: Secondary | ICD-10-CM | POA: Diagnosis present

## 2019-10-07 DIAGNOSIS — N400 Enlarged prostate without lower urinary tract symptoms: Secondary | ICD-10-CM | POA: Diagnosis not present

## 2019-10-07 DIAGNOSIS — G9341 Metabolic encephalopathy: Secondary | ICD-10-CM

## 2019-10-07 DIAGNOSIS — S92901D Unspecified fracture of right foot, subsequent encounter for fracture with routine healing: Secondary | ICD-10-CM

## 2019-10-07 DIAGNOSIS — G609 Hereditary and idiopathic neuropathy, unspecified: Secondary | ICD-10-CM

## 2019-10-07 DIAGNOSIS — S92901A Unspecified fracture of right foot, initial encounter for closed fracture: Secondary | ICD-10-CM | POA: Diagnosis not present

## 2019-10-07 DIAGNOSIS — K219 Gastro-esophageal reflux disease without esophagitis: Secondary | ICD-10-CM | POA: Diagnosis present

## 2019-10-07 DIAGNOSIS — Z20822 Contact with and (suspected) exposure to covid-19: Secondary | ICD-10-CM | POA: Diagnosis present

## 2019-10-07 DIAGNOSIS — L03115 Cellulitis of right lower limb: Secondary | ICD-10-CM

## 2019-10-07 DIAGNOSIS — Z7189 Other specified counseling: Secondary | ICD-10-CM | POA: Diagnosis not present

## 2019-10-07 DIAGNOSIS — R6520 Severe sepsis without septic shock: Secondary | ICD-10-CM | POA: Diagnosis not present

## 2019-10-07 DIAGNOSIS — W19XXXD Unspecified fall, subsequent encounter: Secondary | ICD-10-CM | POA: Diagnosis present

## 2019-10-07 DIAGNOSIS — R338 Other retention of urine: Secondary | ICD-10-CM

## 2019-10-07 DIAGNOSIS — I5042 Chronic combined systolic (congestive) and diastolic (congestive) heart failure: Secondary | ICD-10-CM

## 2019-10-07 LAB — CBC WITH DIFFERENTIAL/PLATELET
Abs Immature Granulocytes: 0.06 10*3/uL (ref 0.00–0.07)
Basophils Absolute: 0 10*3/uL (ref 0.0–0.1)
Basophils Relative: 0 %
Eosinophils Absolute: 0.1 10*3/uL (ref 0.0–0.5)
Eosinophils Relative: 1 %
HCT: 32.3 % — ABNORMAL LOW (ref 39.0–52.0)
Hemoglobin: 10.4 g/dL — ABNORMAL LOW (ref 13.0–17.0)
Immature Granulocytes: 1 %
Lymphocytes Relative: 6 %
Lymphs Abs: 0.5 10*3/uL — ABNORMAL LOW (ref 0.7–4.0)
MCH: 29.4 pg (ref 26.0–34.0)
MCHC: 32.2 g/dL (ref 30.0–36.0)
MCV: 91.2 fL (ref 80.0–100.0)
Monocytes Absolute: 0.5 10*3/uL (ref 0.1–1.0)
Monocytes Relative: 5 %
Neutro Abs: 8.3 10*3/uL — ABNORMAL HIGH (ref 1.7–7.7)
Neutrophils Relative %: 87 %
Platelets: 247 10*3/uL (ref 150–400)
RBC: 3.54 MIL/uL — ABNORMAL LOW (ref 4.22–5.81)
RDW: 12.6 % (ref 11.5–15.5)
WBC: 9.5 10*3/uL (ref 4.0–10.5)
nRBC: 0 % (ref 0.0–0.2)

## 2019-10-07 LAB — COMPREHENSIVE METABOLIC PANEL
ALT: 12 U/L (ref 0–44)
AST: 21 U/L (ref 15–41)
Albumin: 3.4 g/dL — ABNORMAL LOW (ref 3.5–5.0)
Alkaline Phosphatase: 57 U/L (ref 38–126)
Anion gap: 10 (ref 5–15)
BUN: 17 mg/dL (ref 8–23)
CO2: 26 mmol/L (ref 22–32)
Calcium: 8.2 mg/dL — ABNORMAL LOW (ref 8.9–10.3)
Chloride: 106 mmol/L (ref 98–111)
Creatinine, Ser: 1.29 mg/dL — ABNORMAL HIGH (ref 0.61–1.24)
GFR calc Af Amer: >60 mL/min (ref >60)
GFR calc non Af Amer: 53 mL/min — ABNORMAL LOW (ref >60)
Glucose, Bld: 135 mg/dL — ABNORMAL HIGH (ref 70–99)
Potassium: 4 mmol/L (ref 3.5–5.1)
Sodium: 142 mmol/L (ref 135–145)
Total Bilirubin: 0.6 mg/dL (ref 0.3–1.2)
Total Protein: 5.9 g/dL — ABNORMAL LOW (ref 6.5–8.1)

## 2019-10-07 LAB — URINALYSIS, ROUTINE W REFLEX MICROSCOPIC
Bacteria, UA: NONE SEEN
Bilirubin Urine: NEGATIVE
Glucose, UA: NEGATIVE mg/dL
Ketones, ur: NEGATIVE mg/dL
Leukocytes,Ua: NEGATIVE
Nitrite: NEGATIVE
Protein, ur: NEGATIVE mg/dL
Specific Gravity, Urine: 1.014 (ref 1.005–1.030)
pH: 7 (ref 5.0–8.0)

## 2019-10-07 LAB — PROTIME-INR
INR: 1 (ref 0.8–1.2)
Prothrombin Time: 13.1 seconds (ref 11.4–15.2)

## 2019-10-07 LAB — SARS CORONAVIRUS 2 BY RT PCR (HOSPITAL ORDER, PERFORMED IN ~~LOC~~ HOSPITAL LAB): SARS Coronavirus 2: NEGATIVE

## 2019-10-07 LAB — LACTIC ACID, PLASMA
Lactic Acid, Venous: 2.5 mmol/L (ref 0.5–1.9)
Lactic Acid, Venous: 1.8 mmol/L (ref 0.5–1.9)

## 2019-10-07 LAB — APTT: aPTT: 41 seconds — ABNORMAL HIGH (ref 24–36)

## 2019-10-07 MED ORDER — ADULT MULTIVITAMIN W/MINERALS CH
1.0000 | ORAL_TABLET | Freq: Every day | ORAL | Status: DC
  Administered 2019-10-07 – 2019-10-10 (×4): 1 via ORAL
  Filled 2019-10-07 (×4): qty 1

## 2019-10-07 MED ORDER — ROPINIROLE HCL 0.25 MG PO TABS
0.2500 mg | ORAL_TABLET | Freq: Every evening | ORAL | Status: DC
  Administered 2019-10-07 – 2019-10-09 (×2): 0.25 mg via ORAL
  Filled 2019-10-07 (×4): qty 1

## 2019-10-07 MED ORDER — LACTATED RINGERS IV BOLUS (SEPSIS)
500.0000 mL | Freq: Once | INTRAVENOUS | Status: AC
  Administered 2019-10-07: 500 mL via INTRAVENOUS

## 2019-10-07 MED ORDER — LACTATED RINGERS IV BOLUS (SEPSIS)
1000.0000 mL | Freq: Once | INTRAVENOUS | Status: AC
  Administered 2019-10-07: 1000 mL via INTRAVENOUS

## 2019-10-07 MED ORDER — TAMSULOSIN HCL 0.4 MG PO CAPS
0.4000 mg | ORAL_CAPSULE | Freq: Every day | ORAL | Status: DC
  Administered 2019-10-07 – 2019-10-09 (×3): 0.4 mg via ORAL
  Filled 2019-10-07 (×3): qty 1

## 2019-10-07 MED ORDER — ONDANSETRON HCL 4 MG/2ML IJ SOLN: 4.0000 mg | Freq: Four times a day (QID) | INTRAMUSCULAR | Status: DC | PRN

## 2019-10-07 MED ORDER — LACTATED RINGERS IV SOLN: INTRAVENOUS | Status: AC

## 2019-10-07 MED ORDER — ACETAMINOPHEN 325 MG PO TABS
650.0000 mg | ORAL_TABLET | Freq: Four times a day (QID) | ORAL | Status: DC | PRN
  Administered 2019-10-07 – 2019-10-09 (×4): 650 mg via ORAL
  Filled 2019-10-07 (×4): qty 2

## 2019-10-07 MED ORDER — SODIUM CHLORIDE 0.9 % IV SOLN: 2.0000 g | INTRAVENOUS | Status: DC

## 2019-10-07 MED ORDER — BUPRENORPHINE HCL-NALOXONE HCL 8-2 MG SL FILM: 8.0000 mg | ORAL_FILM | Freq: Two times a day (BID) | SUBLINGUAL | Status: DC

## 2019-10-07 MED ORDER — SODIUM CHLORIDE 0.9 % IV SOLN
2.0000 g | INTRAVENOUS | Status: DC
  Administered 2019-10-07: 2 g via INTRAVENOUS
  Filled 2019-10-07: qty 20

## 2019-10-07 MED ORDER — POLYETHYLENE GLYCOL 3350 17 G PO PACK: 17.0000 g | PACK | Freq: Every day | ORAL | Status: DC | PRN

## 2019-10-07 MED ORDER — ENOXAPARIN SODIUM 40 MG/0.4ML ~~LOC~~ SOLN
40.0000 mg | SUBCUTANEOUS | Status: DC
  Administered 2019-10-07 – 2019-10-09 (×3): 40 mg via SUBCUTANEOUS
  Filled 2019-10-07 (×3): qty 0.4

## 2019-10-07 MED ORDER — GABAPENTIN 300 MG PO CAPS
300.0000 mg | ORAL_CAPSULE | Freq: Two times a day (BID) | ORAL | Status: DC
  Administered 2019-10-07 – 2019-10-10 (×6): 300 mg via ORAL
  Filled 2019-10-07 (×6): qty 1

## 2019-10-07 MED ORDER — SODIUM CHLORIDE 0.9 % IV SOLN
2.0000 g | INTRAVENOUS | Status: DC
  Administered 2019-10-08 – 2019-10-09 (×2): 2 g via INTRAVENOUS
  Filled 2019-10-07 (×2): qty 20
  Filled 2019-10-07: qty 2

## 2019-10-07 MED ORDER — BISACODYL 5 MG PO TBEC: 5.0000 mg | DELAYED_RELEASE_TABLET | Freq: Every day | ORAL | Status: DC | PRN

## 2019-10-07 MED ORDER — PANTOPRAZOLE SODIUM 40 MG PO TBEC
40.0000 mg | DELAYED_RELEASE_TABLET | Freq: Every day | ORAL | Status: DC
  Administered 2019-10-07 – 2019-10-10 (×4): 40 mg via ORAL
  Filled 2019-10-07 (×4): qty 1

## 2019-10-07 MED ORDER — MULTI-VITAMIN/MINERALS PO TABS: 1.0000 | ORAL_TABLET | Freq: Every day | ORAL | Status: DC

## 2019-10-07 MED ORDER — QUETIAPINE FUMARATE 100 MG PO TABS
400.0000 mg | ORAL_TABLET | Freq: Every day | ORAL | Status: DC
  Administered 2019-10-07 – 2019-10-09 (×3): 400 mg via ORAL
  Filled 2019-10-07: qty 1
  Filled 2019-10-07 (×2): qty 4

## 2019-10-07 MED ORDER — ONDANSETRON HCL 4 MG PO TABS: 4.0000 mg | ORAL_TABLET | Freq: Four times a day (QID) | ORAL | Status: DC | PRN

## 2019-10-07 NOTE — ED Notes (Signed)
Pts urinary leg bag changed for clean bag.  Will collect urine once sample is available.

## 2019-10-07 NOTE — Progress Notes (Signed)
Orthopedic Tech Progress Note Patient Details:  Dustin [name] 1940-05-18 [id number]  Ortho Devices Type of Ortho Device: Ace wrap, Short leg splint Ortho Device/Splint Location: right Ortho Device/Splint Interventions: Application   Post Interventions Patient Tolerated: Well Instructions Provided: Care of device   [place] Pink 10/07/2019, 6:05 PM

## 2019-10-07 NOTE — ED Triage Notes (Addendum)
BIBA Per EMS: Pt coming from urology  Febrile & tachycardiac  Tremor, fever, and intermittent confusion for about a week now  R foot busing Tylenol given & 100cc NS on EMS A&O X 4

## 2019-10-07 NOTE — Progress Notes (Signed)
Notified bedside nurse of need to draw repeat lactic acid. 

## 2019-10-07 NOTE — ED Notes (Signed)
Pt provided ham sandwich and ginger ale, per pt request.  EDP [place] aware.

## 2019-10-07 NOTE — ED Notes (Signed)
Son at bedside.

## 2019-10-07 NOTE — ED Notes (Signed)
Pt transported to xray 

## 2019-10-07 NOTE — Consult Note (Signed)
Reason for Consult: Right foot fracture Referring Physician: [facility]  Dustin [name] is an 79 y.o. male.  HPI: [place] 1 week ago and sustained foot fractures.  He was seen in an outside hospital where orthopedics was consulted via phone.  He was scheduled outpatient follow-up.  Patient was seen in outside physician's office today where there was concern for altered mental status and was brought to the hospital.  Patient had significant of swelling to the right foot and was concerned for infection.  X-rays were reviewed and demonstrated fractures therefore orthopedics was consulted.  Patient is planned to be admitted to the hospitalist team for monitoring.  On questioning patient states he was given a walking boot at his prior emergency department visit on 09/30/2019.  He has not been wearing the walking boot.  He has been doing some weightbearing which is painful.  He has been going up and down stairs by scooting.  He has pain in his right foot but denies other joint or extremity pain today.  Past Medical History:  Diagnosis Date  . BPH (benign prostatic hyperplasia)   . Chronic combined systolic and diastolic congestive heart failure ([place])   . GERD (gastroesophageal reflux disease)    protonix for control  . Insomnia   . Neck pain   . Neuropathy     Past Surgical History:  Procedure Laterality Date  . ANTERIOR CERVICAL DECOMPRESSION/DISCECTOMY FUSION 4 LEVELS N/A 10/27/2012   Procedure: Cervical Three-Four Cervical Four-Five Cervical Five-Six Cervical Six-Seven  Anterior cervical decompression/diskectomy/fusion;  Surgeon: Erline Levine, MD;  Location: [facility];  Service: Neurosurgery;  Laterality: N/A;  Cervical Three-Four Cervical Four-Five Cervical Five-Six Cervical Six-Seven  Anterior cervical decompression/diskectomy/fusion  . SHOULDER ARTHROSCOPY  2010   rt shoulder  . SHOULDER ARTHROSCOPY  02/15/2011   Procedure: ARTHROSCOPY SHOULDER;  Surgeon: Cynda Familia;  Location: [facility];  Service: Orthopedics;  Laterality: Right;  Debridement  . TONSILLECTOMY      Family History  Problem Relation Age of Onset  . Emphysema Mother   . Cirrhosis Father   . Stroke Brother     Social History:  reports that he quit smoking about 29 years ago. He smoked 1.50 packs per day. He has never used smokeless tobacco. He reports that he does not drink alcohol and does not use drugs.  Allergies: No Known Allergies  Medications: I have reviewed the patient's current medications.  Results for orders placed or performed during the hospital encounter of 10/07/19 (from the past 48 hour(s))  Lactic acid, plasma     Status: Abnormal   Collection Time: 10/07/19  9:52 AM  Result Value Ref Range   Lactic Acid, Venous 2.5 (HH) 0.5 - 1.9 mmol/L    Comment: CRITICAL RESULT CALLED TO, READ BACK BY AND VERIFIED WITH: WEST,S RN @011 :31 10/07/19 BY BOWMAN,K Performed at [facility], [place] [address] [place], [place] [address]   Comprehensive metabolic panel     Status: Abnormal   Collection Time: 10/07/19  9:52 AM  Result Value Ref Range   Sodium 142 135 - 145 mmol/L   Potassium 4.0 3.5 - 5.1 mmol/L   Chloride 106 98 - 111 mmol/L   CO2 26 22 - 32 mmol/L   Glucose, Bld 135 (H) 70 - 99 mg/dL    Comment: Glucose reference range applies only to samples taken after fasting for at least 8 hours.   BUN 17 8 - 23 mg/dL   Creatinine,  Ser 1.29 (H) 0.61 - 1.24 mg/dL   Calcium 8.2 (L) 8.9 - 10.3 mg/dL   Total Protein 5.9 (L) 6.5 - 8.1 g/dL   Albumin 3.4 (L) 3.5 - 5.0 g/dL   AST 21 15 - 41 U/L   ALT 12 0 - 44 U/L   Alkaline Phosphatase 57 38 - 126 U/L   Total Bilirubin 0.6 0.3 - 1.2 mg/dL   GFR calc non Af Amer 53 (L) >60 mL/min   GFR calc Af Amer >60 >60 mL/min   Anion gap 10 5 - 15    Comment: Performed at [facility], [place] [address] [place], [place] [address]  CBC WITH DIFFERENTIAL     Status: Abnormal    Collection Time: 10/07/19  9:52 AM  Result Value Ref Range   WBC 9.5 4.0 - 10.5 K/uL   RBC 3.54 (L) 4.22 - 5.81 MIL/uL   Hemoglobin 10.4 (L) 13.0 - 17.0 g/dL   HCT 32.3 (L) 39 - 52 %   MCV 91.2 80.0 - 100.0 fL   MCH 29.4 26.0 - 34.0 pg   MCHC 32.2 30.0 - 36.0 g/dL   RDW 12.6 11.5 - 15.5 %   Platelets 247 150 - 400 K/uL   nRBC 0.0 0.0 - 0.2 %   Neutrophils Relative % 87 %   Neutro Abs 8.3 (H) 1.7 - 7.7 K/uL   Lymphocytes Relative 6 %   Lymphs Abs 0.5 (L) 0.7 - 4.0 K/uL   Monocytes Relative 5 %   Monocytes Absolute 0.5 0 - 1 K/uL   Eosinophils Relative 1 %   Eosinophils Absolute 0.1 0 - 0 K/uL   Basophils Relative 0 %   Basophils Absolute 0.0 0 - 0 K/uL   Immature Granulocytes 1 %   Abs Immature Granulocytes 0.06 0.00 - 0.07 K/uL    Comment: Performed at [facility], [place] [address] [place], [place] [address]  Protime-INR     Status: None   Collection Time: 10/07/19  9:52 AM  Result Value Ref Range   Prothrombin Time 13.1 11.4 - 15.2 seconds   INR 1.0 0.8 - 1.2    Comment: (NOTE) INR goal varies based on device and disease states. Performed at [facility], [place] [address], [place], [place] [address]   APTT     Status: Abnormal   Collection Time: 10/07/19  9:52 AM  Result Value Ref Range   aPTT 41 (H) 24 - 36 seconds    Comment:        IF BASELINE aPTT IS ELEVATED, SUGGEST PATIENT RISK ASSESSMENT BE USED TO DETERMINE APPROPRIATE ANTICOAGULANT THERAPY. Performed at [facility], [place] [address] [place], [place] [address]   SARS Coronavirus 2 by RT PCR (hospital order, performed in [facility] hospital lab) Nasopharyngeal Nasopharyngeal Swab     Status: None   Collection Time: 10/07/19 11:40 AM   Specimen: Nasopharyngeal Swab  Result Value Ref Range   SARS Coronavirus 2 NEGATIVE NEGATIVE    Comment: (NOTE) SARS-CoV-2 target nucleic acids are NOT DETECTED.  The SARS-CoV-2 RNA is generally detectable in upper and  lower respiratory specimens during the acute phase of infection. The lowest concentration of SARS-CoV-2 viral copies this assay can detect is 250 copies / mL. A negative result does not preclude SARS-CoV-2 infection and should not be used as the sole basis for treatment or other patient management decisions.  A negative result may occur with improper specimen collection / handling,  submission of specimen other than nasopharyngeal swab, presence of viral mutation(s) within the areas targeted by this assay, and inadequate number of viral copies (<250 copies / mL). A negative result must be combined with clinical observations, patient history, and epidemiological information.  Fact Sheet for Patients:   StrictlyIdeas.no  Fact Sheet for Healthcare Providers: BankingDealers.co.za  This test is not yet approved or  cleared by the [place] FDA and has been authorized for detection and/or diagnosis of SARS-CoV-2 by FDA under an Emergency Use Authorization (EUA).  This EUA will remain in effect (meaning this test can be used) for the duration of the COVID-19 declaration under Section 564(b)(1) of the Act, 21 U.S.C. section 360bbb-3(b)(1), unless the authorization is terminated or revoked sooner.  Performed at [facility], [place] [address] [place], [place] [address]   Lactic acid, plasma     Status: None   Collection Time: 10/07/19 12:38 PM  Result Value Ref Range   Lactic Acid, Venous 1.8 0.5 - 1.9 mmol/L    Comment: Performed at [facility], [place] [address], [place], [place] [address]  Urinalysis, Routine w reflex microscopic Urine, Catheterized     Status: Abnormal   Collection Time: 10/07/19 12:56 PM  Result Value Ref Range   Color, Urine YELLOW YELLOW   APPearance CLEAR CLEAR   Specific Gravity, Urine 1.014 1.005 - 1.030   pH 7.0 5.0 - 8.0   Glucose, UA NEGATIVE NEGATIVE mg/dL   Hgb urine  dipstick MODERATE (A) NEGATIVE   Bilirubin Urine NEGATIVE NEGATIVE   Ketones, ur NEGATIVE NEGATIVE mg/dL   Protein, ur NEGATIVE NEGATIVE mg/dL   Nitrite NEGATIVE NEGATIVE   Leukocytes,Ua NEGATIVE NEGATIVE   RBC / HPF 21-50 0 - 5 RBC/hpf   WBC, UA 0-5 0 - 5 WBC/hpf   Bacteria, UA NONE SEEN NONE SEEN    Comment: Performed at [facility], [place] [address], [place], [place] [address]    DG Chest 2 View  Result Date: 10/07/2019 CLINICAL DATA:  Questionable sepsis. Tremor and fever with intermittent confusion EXAM: CHEST - 2 VIEW COMPARISON:  September 30, 2019 FINDINGS: Trachea is midline. Cardiomediastinal contours and hilar structures are normal. Lungs are clear. Subtle increased interstitial markings are noted at the LEFT lung base, likely chronic. No lobar consolidation. No pleural effusion. Skeletal structures on limited assessment are unremarkable. IMPRESSION: Subtle increased interstitial markings at the LEFT lung base may represent scarring, no consolidation or signs of pleural effusion. Electronically Signed   By: Zetta Bills M.D.   On: 10/07/2019 11:18   DG Foot 2 Views Right  Result Date: 10/07/2019 CLINICAL DATA:  Right foot cellulitis, swelling, ecchymosis, recent fracture EXAM: RIGHT FOOT - 2 VIEW COMPARISON:  09/30/2019 FINDINGS: Frontal and lateral views of the right foot are obtained. Comminuted intra-articular fracture at the base of the second proximal phalanx, oblique fracture through the proximal aspect of the third proximal phalanx, and comminuted intra-articular fractures through the bases of the first through fourth metatarsals unchanged. There is diffuse soft tissue edema. No radiographic evidence of osteomyelitis. IMPRESSION: 1. Stable fractures of the second and third proximal phalanges and first through fourth metatarsal bases. 2. Diffuse soft tissue edema. Electronically Signed   By: Randa Ngo M.D.   On: 10/07/2019 15:10    Review of Systems   Constitutional: Positive for fatigue and fever.  HENT:       Headache  Respiratory: Negative.   Cardiovascular: Negative.   Gastrointestinal: Negative.  Musculoskeletal:       Right foot pain  Skin:       Swelling and ecchymosis right foot  Neurological: Negative.   Hematological: Negative.   Psychiatric/Behavioral: Negative.    Blood pressure 136/75, pulse 86, temperature 100.2 F (37.9 C), temperature source Oral, resp. rate (!) 9, SpO2 99 %. Physical Exam HENT:     Head: Normocephalic.     Mouth/Throat:     Mouth: Mucous membranes are moist.  Eyes:     Extraocular Movements: Extraocular movements intact.  Cardiovascular:     Rate and Rhythm: Normal rate and regular rhythm.     Pulses: Normal pulses.  Pulmonary:     Effort: Pulmonary effort is normal.  Musculoskeletal:     Cervical back: Neck supple.     Comments: Right foot demonstrates significant swelling and bruising about the dorsal foot and forefoot.  There is swelling about the ankle.  No redness about the ankle.  No sign of infection.  Tenderness to palpation about the foot.  Sensation grossly intact to light touch.  Palpable dorsalis pedis pulse.  Skin:    General: Skin is warm.  Neurological:     General: No focal deficit present.     Mental Status: He is alert.  Psychiatric:        Mood and Affect: Mood normal.     Assessment/Plan: Patient has sustained significant fractures to his foot which is resulted in significant swelling and bruising to his foot.  I do not feel like he has a cellulitis about his right foot.  This is consistent with his trauma.  He has a fracture of the base of the first metatarsal with disruption of the first TMT joint.  He also has fracture of the base of the second metatarsal with likely Lisfranc disruption.  He has fracture of the proximal phalanx of the second toe and possible fracture of the proximal phalanx of the hallux.  Given the extent of his fractures and joint incongruity  will order CT scan for better evaluation of the fractures.  I had a long discussion with the patient and his family about these injuries.  Normally would recommend surgical intervention for this.  Unfortunately with surgery he would need to remain nonweightbearing for 6 to 8 weeks while the bones heal.  He does not feel like he would want to proceed with surgery and therefore we will order the CT scan.  For now he will be placed in a short leg nonweightbearing splint.  He needs to maintain nonweightbearing.  Soft tissues are too small to proceed with surgery and therefore will likely plan to have this done next week sometime as an outpatient.  I will continue to communicate with the patient and his family regarding the plan.  Patient should float his heels and keep right leg elevated on pillows.  Dustin [name] 10/07/2019, 5:15 PM

## 2019-10-07 NOTE — ED Provider Notes (Addendum)
[facility] DEPT Provider Note   CSN: [id number] Arrival date & time: 10/07/19  3810     History No chief complaint on file.   Dustin [name] is a 79 y.o. male.  HPI  Patient is 79 year old male with a past medical history of BPH, CHF, neuropathy recent right foot fractures involving the right foot metatarsals 1 through 4 and proximal phalanxes 2 and 3.  Patient is a very poor historian possible that there is some encephalopathy may be secondary to his sepsis at play.  Patient states he has no worsening symptoms today and states he feels fine and was not aware that he had a fever however patient son at bedside states that patient was very confused this morning and not at his baseline mentation.  States that patient was taken to urologist this morning for follow-up because he had a Foley placed for urinary retention which was found during his last hospital visit 7 days ago.  At urologist office he was found to have a fever and was sent to the emergency department for further evaluation.  Patient son states that patient has been more lethargic and fatigued over the past 2 days.  Level 4 caveat d/t borderline/questionable AMS   On my review of EMR it appears the patient has had an echo done 01/2016 mild MR with EF 40-45%     Past Medical History:  Diagnosis Date  . BPH (benign prostatic hyperplasia)   . Chronic combined systolic and diastolic congestive heart failure ([place])   . GERD (gastroesophageal reflux disease)    protonix for control  . Insomnia   . Neck pain   . Neuropathy     Patient Active Problem List   Diagnosis Date Noted  . Sepsis due to cellulitis ([place]) 10/07/2019  . New onset headache 01/20/2018  . Muscle weakness (generalized) 11/28/2017  . Weakness 11/26/2017  . Right lumbar radiculopathy 09/18/2016  . Right leg pain 07/22/2016  . Restless leg syndrome 05/28/2016  . Iron deficiency anemia 05/28/2016  . Fall 02/10/2016  .  Rib fracture 02/10/2016  . Acute encephalopathy 02/10/2016  . Chronic combined systolic and diastolic congestive heart failure ([place])   . Ataxia   . Hyponatremia 01/22/2016  . Gait instability 01/22/2016  . Urinary retention   . Stroke-like symptom   . Syncope 05/02/2015  . Closed left fibular fracture 05/02/2015  . GERD (gastroesophageal reflux disease) 05/02/2015  . BPH (benign prostatic hyperplasia) 05/02/2015  . Fracture of distal end of left fibula   . 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 Depression 01/30/2015  . 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 ANTERIOR CERVICAL DECOMPRESSION/DISCECTOMY FUSION 4 LEVELS N/A 10/27/2012   Procedure: Cervical Three-Four Cervical Four-Five Cervical Five-Six Cervical Six-Seven  Anterior cervical decompression/diskectomy/fusion;  Surgeon: Erline Levine, MD;  Location: [facility];  Service: Neurosurgery;  Laterality: N/A;  Cervical Three-Four Cervical Four-Five Cervical Five-Six Cervical Six-Seven  Anterior cervical decompression/diskectomy/fusion  . SHOULDER ARTHROSCOPY  2010   rt shoulder  . SHOULDER ARTHROSCOPY  02/15/2011   Procedure: ARTHROSCOPY SHOULDER;  Surgeon: Cynda Familia;  Location: [facility];  Service: Orthopedics;  Laterality: Right;  Debridement  . TONSILLECTOMY         Family History  Problem Relation Age of Onset  . Emphysema Mother   . Cirrhosis Father   . Stroke Brother     Social History   Tobacco Use  . Smoking status: Former  Smoker    Packs/day: 1.50    Quit date: 01/27/1990    Years since quitting: 29.7  . Smokeless tobacco: Never Used  Vaping Use  . Vaping Use: Never used  Substance Use Topics  . Alcohol use: No  . Drug use: No    Home Medications Prior to Admission medications   Medication Sig Start Date End Date Taking? Authorizing Provider  Buprenorphine HCl-Naloxone HCl 8-2 MG FILM Place 0.5 strips under the tongue 4 (four)  times daily. 09/23/19  Yes [provider]  gabapentin (NEURONTIN) 300 MG capsule Take 600 mg by mouth 4 (four) times daily. 09/16/19  Yes [provider]  Melatonin 10 MG TABS Take 10 mg by mouth at bedtime as needed.   Yes [provider]  Multiple Vitamins-Minerals (MULTIVITAMIN WITH MINERALS) tablet Take 1 tablet by mouth daily.   Yes [provider]  pantoprazole (PROTONIX) 40 MG tablet Take 40 mg by mouth daily.     Yes [provider]  QUEtiapine (SEROQUEL) 400 MG tablet Take 400 mg by mouth at bedtime. 08/31/19  Yes [provider]  rOPINIRole (REQUIP) 0.25 MG tablet Take 0.25 mg by mouth every evening. 07/23/19  Yes [provider]  silodosin (RAPAFLO) 8 MG CAPS capsule Take 8 mg by mouth daily. 08/07/19  Yes [provider]  SODIUM FLUORIDE 5000 PPM 1.1 % PSTE Take 1 application by mouth 2 (two) times daily. Do not eat or drink for 30 minutes after 09/09/19  Yes [provider]  TANDEM 162-115.2 MG CAPS capsule TAKE 1 CAPSULE BY MOUTH DAILY WITH BREAKFAST. Patient taking differently: Take 1 capsule by mouth daily with breakfast.  07/06/18  Yes Marcial Pacas, MD  methadone (DOLOPHINE) 10 MG tablet Take 10 mg by mouth every 8 (eight) hours. Do not exceed 3 tab/day Patient not taking: Reported on 10/07/2019 06/26/19   [provider]  Oxcarbazepine (TRILEPTAL) 300 MG tablet TAKE 1 TABLET BY MOUTH TWICE A DAY Patient not taking: Reported on 10/07/2019 06/01/18   Marcial Pacas, MD  rOPINIRole (REQUIP) 1 MG tablet TAKE 1 TABLET (1 MG TOTAL) BY MOUTH AT BEDTIME. Patient not taking: Reported on 10/07/2019 12/15/17   Marcial Pacas, MD    Allergies    Patient has no known allergies.  Review of Systems   Review of Systems  Constitutional: Positive for chills and fever.       Rigors   HENT: Negative for congestion.   Eyes: Negative for pain.  Respiratory: Negative for cough and shortness of breath.   Cardiovascular: Negative  for chest pain and leg swelling.  Gastrointestinal: Negative for abdominal pain and vomiting.  Genitourinary: Negative for dysuria.  Musculoskeletal: Negative for myalgias.       Right foot pain  Skin: Negative for rash.  Neurological: Negative for dizziness and headaches.    Physical Exam Updated Vital Signs BP (!) 152/77   Pulse 81   Temp 100.2 F (37.9 C) (Oral)   Resp 10   SpO2 97%   Physical Exam Vitals and nursing note reviewed.  Constitutional:      General: He is not in acute distress.    Appearance: He is ill-appearing.  HENT:     Head: Normocephalic and atraumatic.     Nose: Nose normal.     Mouth/Throat:     Mouth: Mucous membranes are dry.  Eyes:     General: No scleral icterus. Cardiovascular:     Rate and Rhythm: Regular rhythm. Tachycardia present.  Pulses: Normal pulses.     Heart sounds: Normal heart sounds.     Comments: HR 118 Pulmonary:     Effort: Pulmonary effort is normal. No respiratory distress.     Breath sounds: No wheezing.  Abdominal:     Palpations: Abdomen is soft.     Tenderness: There is no abdominal tenderness. There is no guarding or rebound.  Genitourinary:    Comments: Foley catheter in place. Musculoskeletal:     Cervical back: Normal range of motion.     Right lower leg: Edema present.     Left lower leg: No edema.  Skin:    General: Skin is warm and dry.     Capillary Refill: Capillary refill takes less than 2 seconds.     Comments: Significant swelling and tenderness to palpation of the right foot.  There is bruising evident in the toes and midfoot with significant swelling.  The right foot is notably hot to touch and contrast the left foot which is cool.  Pulses palpable DP and PT bilaterally. Cellulitic rash involving the right midfoot extending to the ankle and with some faint red streaking resembling possible lymphangitis  Neurological:     Mental Status: He is alert. Mental status is at baseline.  Psychiatric:          Mood and Affect: Mood normal.        Behavior: Behavior normal.            ED Results / Procedures / Treatments   Labs (all labs ordered are listed, but only abnormal results are displayed) Labs Reviewed  LACTIC ACID, PLASMA - Abnormal; Notable for the following components:      Result Value   Lactic Acid, Venous 2.5 (*)    All other components within normal limits  COMPREHENSIVE METABOLIC PANEL - Abnormal; Notable for the following components:   Glucose, Bld 135 (*)    Creatinine, Ser 1.29 (*)    Calcium 8.2 (*)    Total Protein 5.9 (*)    Albumin 3.4 (*)    GFR calc non Af Amer 53 (*)    All other components within normal limits  CBC WITH DIFFERENTIAL/PLATELET - Abnormal; Notable for the following components:   RBC 3.54 (*)    Hemoglobin 10.4 (*)    HCT 32.3 (*)    Neutro Abs 8.3 (*)    Lymphs Abs 0.5 (*)    All other components within normal limits  APTT - Abnormal; Notable for the following components:   aPTT 41 (*)    All other components within normal limits  URINALYSIS, ROUTINE W REFLEX MICROSCOPIC - Abnormal; Notable for the following components:   Hgb urine dipstick MODERATE (*)    All other components within normal limits  SARS CORONAVIRUS 2 BY RT PCR (HOSPITAL ORDER, [facility])  CULTURE, BLOOD (ROUTINE X 2)  CULTURE, BLOOD (ROUTINE X 2)  URINE CULTURE  LACTIC ACID, PLASMA  PROTIME-INR    EKG EKG Interpretation  Date/Time:  Thursday October 07 2019 12:21:35 EDT Ventricular Rate:  99 PR Interval:    QRS Duration: 88 QT Interval:  333 QTC Calculation: 428 R Axis:   32 Text Interpretation: Sinus rhythm No significant change since last tracing Confirmed by Isla Pence [phone]) on 10/07/2019 12:27:41 PM   Radiology DG Chest 2 View  Result Date: 10/07/2019 CLINICAL DATA:  Questionable sepsis. Tremor and fever with intermittent confusion EXAM: CHEST - 2 VIEW COMPARISON:  September 30, 2019  FINDINGS: Trachea is midline.  Cardiomediastinal contours and hilar structures are normal. Lungs are clear. Subtle increased interstitial markings are noted at the LEFT lung base, likely chronic. No lobar consolidation. No pleural effusion. Skeletal structures on limited assessment are unremarkable. IMPRESSION: Subtle increased interstitial markings at the LEFT lung base may represent scarring, no consolidation or signs of pleural effusion. Electronically Signed   By: Zetta Bills M.D.   On: 10/07/2019 11:18   DG Foot 2 Views Right  Result Date: 10/07/2019 CLINICAL DATA:  Right foot cellulitis, swelling, ecchymosis, recent fracture EXAM: RIGHT FOOT - 2 VIEW COMPARISON:  09/30/2019 FINDINGS: Frontal and lateral views of the right foot are obtained. Comminuted intra-articular fracture at the base of the second proximal phalanx, oblique fracture through the proximal aspect of the third proximal phalanx, and comminuted intra-articular fractures through the bases of the first through fourth metatarsals unchanged. There is diffuse soft tissue edema. No radiographic evidence of osteomyelitis. IMPRESSION: 1. Stable fractures of the second and third proximal phalanges and first through fourth metatarsal bases. 2. Diffuse soft tissue edema. Electronically Signed   By: Randa Ngo M.D.   On: 10/07/2019 15:10    Procedures .Critical Care Performed by: Tedd Sias, PA Authorized by: Tedd Sias, PA   Critical care provider statement:    Critical care time (minutes):  45   Critical care was necessary to treat or prevent imminent or life-threatening deterioration of the following conditions:  Sepsis   Critical care was time spent personally by me on the following activities:  Discussions with consultants, evaluation of patient's response to treatment, examination of patient, ordering and performing treatments and interventions, ordering and review of laboratory studies, ordering and review of radiographic studies, pulse oximetry,  re-evaluation of patient's condition, obtaining history from patient or surrogate and review of old charts   (including critical care time)  Medications Ordered in ED Medications  lactated ringers infusion (has no administration in time range)  lactated ringers bolus 1,000 mL (1,000 mLs Intravenous New Bag/Given 10/07/19 1243)    And  lactated ringers bolus 1,000 mL (1,000 mLs Intravenous New Bag/Given 10/07/19 1044)    And  lactated ringers bolus 500 mL (has no administration in time range)  cefTRIAXone (ROCEPHIN) 2 g in sodium chloride 0.9 % 100 mL IVPB (0 g Intravenous Stopped 10/07/19 1138)    ED Course  I have reviewed the triage vital signs and the nursing notes.  Pertinent labs & imaging results that were available during my care of the patient were reviewed by me and considered in my medical decision making (see chart for details).  Patient is 79 year old male presented today for fever and altered mental status this morning.  He is mentating better now per son than prior. Patient presented with significantly abnormal vital signs meet sepsis criteria will initiate code sepsis.  Likely cellulitis from right lower extremity or urosepsis.  Will cover wiith Rocephin at this time and bolused with IV fluids.  Patient will receive 30 mils per kilogram bolus.  Physical exam is notable for cellulitic rash to the right lower extremity with swelling warmth to touch and some tenderness and redness.  Suspect this is the source of the infection.  X-ray reviewed by myself agree of radiologist read there is no acute infiltrate may be some faint scarring left base.  Right foot shows swelling in the soft tissue but no evidence of osteomyelitis.  Agree of radiology read.  Urinalysis without any evidence of infection.  Lactate  initially 2.5, rapid Covid swab negative.  Blood cultures pending.  CMP with mild creatinine elevation of 1.29 out if this is somewhat improved from prior 7 days ago.  This may be  patient's new baseline.  CBC without leukocytosis but there is neutrophil predominance.  Hemoglobin is mildly decreased.  Clinical Course as of Oct 07 1598  Thu Oct 07, 2019  1105 Vitals per EMS  102.7 temp 20 RR 118 HR 96% SpO2 42 ETCO2   [WF]  1107 CBC without leukocytosis. Mild anemia with decrease from 12.1 from CBC 1 week ago. No obvious bleeding.    [WF]  1122 Chest x-ray reviewed by myself.  No infiltrate or evidence of acute infection.  No acute cardiopulmonary disorder seen on x-ray.  Agree of radiology read.   [WF]  1354 Discussed with Dr. Cyndia Skeeters who will admit for sepsis 2/2 cellulitis   [WF]    Clinical Course User Index [WF] Tedd Sias, PA   Reach out to orthopedics to discuss patient per Dr. Juliann Pares request. No return call by end of shift change at 4 PM.  Consult was placed approximately 2 hours ago.  Oncoming provider made aware of pending consult.   4:15 PM addendum - discussed with on call orthopedics who will review patient case.   MDM Rules/Calculators/A&P                          Final Clinical Impression(s) / ED Diagnoses Final diagnoses:  Cellulitis of right lower extremity  Sepsis due to cellulitis [facility])    Rx / DC Orders ED Discharge Orders    None       Tedd Sias, [place] 10/07/19 1601    Pati Gallo [place], [place] 10/07/19 1615    Isla Pence, MD 10/08/19 3343    Isla Pence, MD 10/08/19 [phone]

## 2019-10-07 NOTE — ED Notes (Signed)
Unsuccessful attempt to get 2nd set of blood cultures. Antibiotics started as to not delay care.

## 2019-10-07 NOTE — H&P (Signed)
History and Physical    Dustin [name]:[id number] DOB: [birth year] DOA: 10/07/2019  PCP: Dustin Amel, MD Patient coming from: Home.  Chief Complaint: For the swelling  HPI: Dustin [name] is a 79 y.o. male with history of combined CHF, recent right foot fracture, BPH, urinary retention with indwelling Foley, chronic pain, depression, ataxia and restless leg syndrome directed to ED from neurology office due to febrile, tachycardia, altered mental status and right foot swelling.  Patient had a fall at home last week. Reportedly fell walking in the kitchen. Reports hitting his head but denies LOC. No prodrome leading to fall. He presented to [facility] mid center found to have right foot fracture with acute urinary retention. He he was discharged with walking boot and indwelling Foley catheter to follow-up with orthopedic surgery and urology.   Patient went to urology office this morning and found to be febrile, tachycardic and altered. He also had significant swelling and redness in right foot. He was directed to ED for evaluation.  Patient reports worsening redness and pain in his right foot over the course of last week. His pain was 10/10 with minimal weightbearing on right foot. His pain is currently 0/10 resting in bed. He also reports subjective fever. Denies chills, chest pain, dyspnea, nausea, vomiting, abdominal pain or diarrhea. He reports some tremoring in his arms.  Patient denies smoking cigarettes, drinking alcohol recreational drug use.  In ED, HR 119>> 79. RR 20>> 22. Mild temp to 100.2. WBC 9.5. Cr 1.29 (1.3618/12). Hgb 10.1 (12.1 on 8/12). Lactic acid 2.5 but improved to one-point 2:08 0.5 L IVF. UA with moderate Hgb. COVID-19 PCR negative. Right foot x-ray with first through fourth metatarsal and second through third proximal phalangeal fractures. Sepsis code activated. Patient received 2.5 L IVF. Cultures obtained. Started on IV ceftriaxone and hospitalist service was  called for admission. EDP to call orthopedic surgery.   ROS All review of system negative except for pertinent positives and negatives as history of present illness above.  PMH Past Medical History:  Diagnosis Date  . BPH (benign prostatic hyperplasia)   . Chronic combined systolic and diastolic congestive heart failure ([place])   . GERD (gastroesophageal reflux disease)    protonix for control  . Insomnia   . Neck pain   . Neuropathy    PSH Past Surgical History:  Procedure Laterality Date  . ANTERIOR CERVICAL DECOMPRESSION/DISCECTOMY FUSION 4 LEVELS N/A 10/27/2012   Procedure: Cervical Three-Four Cervical Four-Five Cervical Five-Six Cervical Six-Seven  Anterior cervical decompression/diskectomy/fusion;  Surgeon: Erline Levine, MD;  Location: [facility];  Service: Neurosurgery;  Laterality: N/A;  Cervical Three-Four Cervical Four-Five Cervical Five-Six Cervical Six-Seven  Anterior cervical decompression/diskectomy/fusion  . SHOULDER ARTHROSCOPY  2010   rt shoulder  . SHOULDER ARTHROSCOPY  02/15/2011   Procedure: ARTHROSCOPY SHOULDER;  Surgeon: Cynda Familia;  Location: [facility];  Service: Orthopedics;  Laterality: Right;  Debridement  . TONSILLECTOMY     Fam HX Family History  Problem Relation Age of Onset  . Emphysema Mother   . Cirrhosis Father   . Stroke Brother     Social Hx  reports that he quit smoking about 29 years ago. He smoked 1.50 packs per day. He has never used smokeless tobacco. He reports that he does not drink alcohol and does not use drugs.  Allergy No Known Allergies Home Meds Prior to Admission medications   Medication Sig Start Date End Date Taking? Authorizing Provider  Buprenorphine HCl-Naloxone HCl  8-2 MG FILM Place 0.5 strips under the tongue 4 (four) times daily. 09/23/19  Yes [provider]  gabapentin (NEURONTIN) 300 MG capsule Take 600 mg by mouth 4 (four) times daily. 09/16/19  Yes [provider]   Melatonin 10 MG TABS Take 10 mg by mouth at bedtime as needed.   Yes [provider]  Multiple Vitamins-Minerals (MULTIVITAMIN WITH MINERALS) tablet Take 1 tablet by mouth daily.   Yes [provider]  pantoprazole (PROTONIX) 40 MG tablet Take 40 mg by mouth daily.     Yes [provider]  QUEtiapine (SEROQUEL) 400 MG tablet Take 400 mg by mouth at bedtime. 08/31/19  Yes [provider]  rOPINIRole (REQUIP) 0.25 MG tablet Take 0.25 mg by mouth every evening. 07/23/19  Yes [provider]  silodosin (RAPAFLO) 8 MG CAPS capsule Take 8 mg by mouth daily. 08/07/19  Yes [provider]  SODIUM FLUORIDE 5000 PPM 1.1 % PSTE Take 1 application by mouth 2 (two) times daily. Do not eat or drink for 30 minutes after 09/09/19  Yes [provider]  TANDEM 162-115.2 MG CAPS capsule TAKE 1 CAPSULE BY MOUTH DAILY WITH BREAKFAST. Patient taking differently: Take 1 capsule by mouth daily with breakfast.  07/06/18  Yes Marcial Pacas, MD  methadone (DOLOPHINE) 10 MG tablet Take 10 mg by mouth every 8 (eight) hours. Do not exceed 3 tab/day Patient not taking: Reported on 10/07/2019 06/26/19   [provider]  Oxcarbazepine (TRILEPTAL) 300 MG tablet TAKE 1 TABLET BY MOUTH TWICE A DAY Patient not taking: Reported on 10/07/2019 06/01/18   Marcial Pacas, MD  rOPINIRole (REQUIP) 1 MG tablet TAKE 1 TABLET (1 MG TOTAL) BY MOUTH AT BEDTIME. Patient not taking: Reported on 10/07/2019 12/15/17   Marcial Pacas, MD    Physical Exam: Vitals:   10/07/19 1013 10/07/19 1249 10/07/19 1326 10/07/19 1405  BP: 125/70 125/68 133/75 121/77  Pulse: (!) 117 90 81 92  Resp: 20 17 (!) 22 14  Temp: 100.2 F (37.9 C)     TempSrc: Oral     SpO2: 93% 91% 98% 97%    GENERAL: No acute distress.  Appears well.  HEENT: MMM.  Vision grossly intact. Diminished hearing. NECK: Supple.  No apparent JVD.  RESP:  No IWOB. Good air movement bilaterally. CVS:  RRR. Heart sounds normal.   ABD/GI/GU: Bowel sounds present. Soft. Non tender. Indwelling Foley catheter in place MSK/EXT:  Moves extremities. Right foot swelling with skin erythema and tenderness. DP pulses 2+ SKIN: Skin erythema with increased warmth to touch over right foot. See pictures below NEURO: Awake, alert and oriented x4 except to date..  No gross deficit.  PSYCH: Calm. Normal affect.         Personally Reviewed Radiological Exams DG Chest 2 View  Result Date: 10/07/2019 CLINICAL DATA:  Questionable sepsis. Tremor and fever with intermittent confusion EXAM: CHEST - 2 VIEW COMPARISON:  September 30, 2019 FINDINGS: Trachea is midline. Cardiomediastinal contours and hilar structures are normal. Lungs are clear. Subtle increased interstitial markings are noted at the LEFT lung base, likely chronic. No lobar consolidation. No pleural effusion. Skeletal structures on limited assessment are unremarkable. IMPRESSION: Subtle increased interstitial markings at the LEFT lung base may represent scarring, no consolidation or signs of pleural effusion. Electronically Signed   By: Zetta Bills M.D.   On: 10/07/2019 11:18     Personally Reviewed Labs: CBC: Recent Labs  Lab 10/07/19 0952  WBC 9.5  NEUTROABS 8.3*  HGB 10.4*  HCT 32.3*  MCV 91.2  PLT 427   Basic Metabolic Panel: Recent Labs  Lab 10/07/19 0952  NA 142  K 4.0  CL 106  CO2 26  GLUCOSE 135*  BUN 17  CREATININE 1.29*  CALCIUM 8.2*   GFR: Estimated Creatinine Clearance: 54.9 mL/min (A) (by C-G formula based on SCr of 1.29 mg/dL (H)). Liver Function Tests: Recent Labs  Lab 10/07/19 0952  AST 21  ALT 12  ALKPHOS 57  BILITOT 0.6  PROT 5.9*  ALBUMIN 3.4*   No results for input(s): LIPASE, AMYLASE in the last 168 hours. No results for input(s): AMMONIA in the last 168 hours. Coagulation Profile: Recent Labs  Lab 10/07/19 0952  INR 1.0   Cardiac Enzymes: No results for input(s): CKTOTAL, CKMB, CKMBINDEX, TROPONINI in the last 168  hours. BNP (last 3 results) No results for input(s): PROBNP in the last 8760 hours. HbA1C: No results for input(s): HGBA1C in the last 72 hours. CBG: No results for input(s): GLUCAP in the last 168 hours. Lipid Profile: No results for input(s): CHOL, HDL, LDLCALC, TRIG, CHOLHDL, LDLDIRECT in the last 72 hours. Thyroid Function Tests: No results for input(s): TSH, T4TOTAL, FREET4, T3FREE, THYROIDAB in the last 72 hours. Anemia Panel: No results for input(s): VITAMINB12, FOLATE, FERRITIN, TIBC, IRON, RETICCTPCT in the last 72 hours. Urine analysis:    Component Value Date/Time   COLORURINE YELLOW 10/07/2019 [place] 10/07/2019 1256   LABSPEC 1.014 10/07/2019 1256   PHURINE 7.0 10/07/2019 1256   GLUCOSEU NEGATIVE 10/07/2019 1256   HGBUR MODERATE (A) 10/07/2019 1256   BILIRUBINUR NEGATIVE 10/07/2019 1256   KETONESUR NEGATIVE 10/07/2019 1256   PROTEINUR NEGATIVE 10/07/2019 1256   NITRITE NEGATIVE 10/07/2019 1256   LEUKOCYTESUR NEGATIVE 10/07/2019 1256    Sepsis Labs:  Lactic acid 2.5> 1.8 after IV fluid  Personally Reviewed EKG:  12-lead EKG with normal sinus rhythm.  Assessment/Plan Severe sepsis due to right foot cellulitis-patient was tachycardic and tachypneic. Source of infection right foot cellulitis. Endorgan damage as evidenced by lactic acidosis and encephalopathy. Resuscitated with IV fluid and started on IV ceftriaxone. Now hemodynamically stable. -Continue IV ceftriaxone -Follow cultures  Acute toxic metabolic encephalopathy-multifactorial including sepsis and polypharmacy. Patient is on multiple sedating medications. Encephalopathy seems to have resolved now. He is oriented x4 except to date.  -Treat sepsis as above. -Reduced home gabapentin, Suboxone -Continue home Seroquel-which is a still high dose.  Fall at home-subsequent encounter Right foot fracture -Foot x-ray revealed first through fourth metatarsal and second through third proximal  phalangeal fractures -Ortho consulted by EDP.  Chronic combined systolic and diastolic CHF: Echo in 0623 with EF of 40 to 45%, G1 DD. Not on diuretics. Appears euvolemic. -Monitor fluid status  BPH with acute urinary retention-had indwelling Foley catheter inserted last week.  -May consider voiding trial prior to discharge once sepsis improves.  Chronic pain/peripheral neuropathy -continue home Suboxone and gabapentin at reduced dose  Depression: Stable -Continue home Seroquel  GOC/DNR/DNI-appropriate  DVT prophylaxis: Subcu Lovenox  Code Status: DNR/DNI Family Communication: Updated patient's son at bedside  Disposition Plan: Admit to telemetry Consults called: Orthopedic surgery by EDP Admission status: Inpatient.    Mercy Riding MD [facility]  If 7PM-7AM, please contact night-coverage [email or web address]  10/07/2019, 2:35 PM

## 2019-10-08 ENCOUNTER — Encounter (HOSPITAL_COMMUNITY): Payer: Self-pay | Admitting: Student

## 2019-10-08 DIAGNOSIS — G609 Hereditary and idiopathic neuropathy, unspecified: Secondary | ICD-10-CM

## 2019-10-08 DIAGNOSIS — F329 Major depressive disorder, single episode, unspecified: Secondary | ICD-10-CM

## 2019-10-08 DIAGNOSIS — R338 Other retention of urine: Secondary | ICD-10-CM

## 2019-10-08 DIAGNOSIS — N400 Enlarged prostate without lower urinary tract symptoms: Secondary | ICD-10-CM

## 2019-10-08 DIAGNOSIS — G9341 Metabolic encephalopathy: Secondary | ICD-10-CM

## 2019-10-08 DIAGNOSIS — L0390 Cellulitis, unspecified: Secondary | ICD-10-CM

## 2019-10-08 DIAGNOSIS — S92901A Unspecified fracture of right foot, initial encounter for closed fracture: Secondary | ICD-10-CM

## 2019-10-08 DIAGNOSIS — L03115 Cellulitis of right lower limb: Secondary | ICD-10-CM

## 2019-10-08 LAB — CBC WITH DIFFERENTIAL/PLATELET
Abs Immature Granulocytes: 0.05 10*3/uL (ref 0.00–0.07)
Basophils Absolute: 0 10*3/uL (ref 0.0–0.1)
Basophils Relative: 0 %
Eosinophils Absolute: 0.2 10*3/uL (ref 0.0–0.5)
Eosinophils Relative: 3 %
HCT: 30.1 % — ABNORMAL LOW (ref 39.0–52.0)
Hemoglobin: 9.9 g/dL — ABNORMAL LOW (ref 13.0–17.0)
Immature Granulocytes: 1 %
Lymphocytes Relative: 23 %
Lymphs Abs: 1.2 10*3/uL (ref 0.7–4.0)
MCH: 30 pg (ref 26.0–34.0)
MCHC: 32.9 g/dL (ref 30.0–36.0)
MCV: 91.2 fL (ref 80.0–100.0)
Monocytes Absolute: 0.5 10*3/uL (ref 0.1–1.0)
Monocytes Relative: 10 %
Neutro Abs: 3.4 10*3/uL (ref 1.7–7.7)
Neutrophils Relative %: 63 %
Platelets: 177 10*3/uL (ref 150–400)
RBC: 3.3 MIL/uL — ABNORMAL LOW (ref 4.22–5.81)
RDW: 12.6 % (ref 11.5–15.5)
WBC: 5.4 10*3/uL (ref 4.0–10.5)
nRBC: 0 % (ref 0.0–0.2)

## 2019-10-08 LAB — FOLATE: Folate: 20.5 ng/mL (ref >5.9)

## 2019-10-08 LAB — BASIC METABOLIC PANEL
Anion gap: 8 (ref 5–15)
BUN: 13 mg/dL (ref 8–23)
CO2: 29 mmol/L (ref 22–32)
Calcium: 8.7 mg/dL — ABNORMAL LOW (ref 8.9–10.3)
Chloride: 102 mmol/L (ref 98–111)
Creatinine, Ser: 1.1 mg/dL (ref 0.61–1.24)
GFR calc Af Amer: >60 mL/min (ref >60)
GFR calc non Af Amer: >60 mL/min (ref >60)
Glucose, Bld: 109 mg/dL — ABNORMAL HIGH (ref 70–99)
Potassium: 3.9 mmol/L (ref 3.5–5.1)
Sodium: 139 mmol/L (ref 135–145)

## 2019-10-08 LAB — URINE CULTURE: Culture: NO GROWTH

## 2019-10-08 LAB — IRON AND TIBC
Iron: 45 ug/dL (ref 45–182)
Saturation Ratios: 17 % — ABNORMAL LOW (ref 17.9–39.5)
TIBC: 271 ug/dL (ref 250–450)
UIBC: 226 ug/dL

## 2019-10-08 LAB — FERRITIN: Ferritin: 166 ng/mL (ref 24–336)

## 2019-10-08 LAB — VITAMIN B12: Vitamin B-12: 307 pg/mL (ref 180–914)

## 2019-10-08 MED ORDER — OXYCODONE-ACETAMINOPHEN 5-325 MG PO TABS
1.0000 | ORAL_TABLET | ORAL | Status: DC | PRN
Start: 2019-10-08 — End: 2019-10-08

## 2019-10-08 MED ORDER — TRAMADOL HCL 50 MG PO TABS
100.0000 mg | ORAL_TABLET | Freq: Four times a day (QID) | ORAL | Status: DC | PRN
  Administered 2019-10-08 – 2019-10-09 (×4): 100 mg via ORAL
  Filled 2019-10-08 (×4): qty 2

## 2019-10-08 MED ORDER — BUPRENORPHINE HCL-NALOXONE HCL 8-2 MG SL SUBL
1.0000 | SUBLINGUAL_TABLET | Freq: Two times a day (BID) | SUBLINGUAL | Status: DC
  Administered 2019-10-08 – 2019-10-10 (×5): 1 via SUBLINGUAL
  Filled 2019-10-08 (×5): qty 1

## 2019-10-08 NOTE — ED Notes (Signed)
Dustin [name], son would like the nurse to call him, [phone].

## 2019-10-08 NOTE — TOC Initial Note (Signed)
Transition of Care [facility]) - Initial/Assessment Note    Patient Details  Name: Dustin [name] MRN: [id number] Date of Birth: Jul 08, 1940  Transition of Care [facility]) CM/SW Contact:    Erenest Rasher, RN Phone Number: [phone] 10/08/2019, 5:08 PM  Clinical Narrative:                 TOC CM spoke to pt at bedside. States he prefers to go home with [facility] PT, OT and aide. Pt will need RW and 3n1 bedside commode. Pt states his son and DIL will assist them at home. Gave permission to speak to son, Ronalee Belts in case of emergency. States he has wheelchair and crutches. Offered choice for [facility]. Pt agreeable to agency that will accept insurance. Message sent attending.     Expected Discharge Plan: [place] Barriers to Discharge: Continued Medical Work up   Patient Goals and CMS Choice Patient states their goals for this hospitalization and ongoing recovery are:: prefers to go home and complete rehab CMS Medicare.gov Compare Post Acute Care list provided to:: Patient Choice offered to / list presented to : Patient  Expected Discharge Plan and Services Expected Discharge Plan: [place] In-house Referral: Clinical Social Work Discharge Planning Services: CM Consult Post Acute Care Choice: [place] arrangements for the past 2 months: [place]                                      Prior Living Arrangements/Services Living arrangements for the past 2 months: Single Family Home Lives with:: Spouse, Adult Children Patient language and need for interpreter reviewed:: Yes        Need for Family Participation in Patient Care: Yes (Comment) Care giver support system in place?: Yes (comment) Current home services: DME (wheelchair, crutches) Criminal Activity/Legal Involvement Pertinent to Current Situation/Hospitalization: No - Comment as needed  Activities of Daily Living Home Assistive Devices/Equipment: Cane (specify quad or  straight), Eyeglasses (single point canes, left hearing aid) ADL Screening (condition at time of admission) Patient's cognitive ability adequate to safely complete daily activities?: Yes Is the patient deaf or have difficulty hearing?: Yes (wears hearing aid on left (right aid is broken)) Does the patient have difficulty seeing, even when wearing glasses/contacts?: No Does the patient have difficulty concentrating, remembering, or making decisions?: No Patient able to express need for assistance with ADLs?: Yes Does the patient have difficulty dressing or bathing?: Yes Independently performs ADLs?: No Communication: Independent Dressing (OT): Independent Grooming: Independent Feeding: Independent Bathing: Independent Toileting: Dependent Is this a change from baseline?: Change from baseline, expected to last >3days In/Out Bed: Dependent Is this a change from baseline?: Change from baseline, expected to last >3 days Walks in Home: Dependent Is this a change from baseline?: Change from baseline, expected to last >3 days Does the patient have difficulty walking or climbing stairs?: Yes (secondary to right foor swelling and pain) Weakness of Legs: Right Weakness of Arms/Hands: None  Permission Sought/Granted Permission sought to share information with : Case Manager, PCP, Family Supports Permission granted to share information with : Yes, Verbal Permission Granted  Share Information with NAME: Montrel Donahoe  Permission granted to share info w AGENCY: [place] granted to share info w Relationship: son  Permission granted to share info w Contact Information: [phone]  Emotional Assessment Appearance:: Appears stated age Attitude/Demeanor/Rapport: Engaged  Affect (typically observed): Accepting Orientation: : Oriented to Self, Oriented to Place, Oriented to  Time, Oriented to Situation   Psych Involvement: No (comment)  Admission diagnosis:  Sepsis due to cellulitis  (HCC) [L03.90, A41.9] Patient Active Problem List   Diagnosis Date Noted  . Sepsis due to cellulitis ([place]) 10/07/2019  . New onset headache 01/20/2018  . Muscle weakness (generalized) 11/28/2017  . Weakness 11/26/2017  . Right lumbar radiculopathy 09/18/2016  . Right leg pain 07/22/2016  . Restless leg syndrome 05/28/2016  . Iron deficiency anemia 05/28/2016  . Fall 02/10/2016  . Rib fracture 02/10/2016  . Acute encephalopathy 02/10/2016  . Chronic combined systolic and diastolic congestive heart failure ([place])   . Ataxia   . Hyponatremia 01/22/2016  . Gait instability 01/22/2016  . Urinary retention   . Stroke-like symptom   . Syncope 05/02/2015  . Closed left fibular fracture 05/02/2015  . GERD (gastroesophageal reflux disease) 05/02/2015  . BPH (benign prostatic hyperplasia) 05/02/2015  . Fracture of distal end of left fibula   . 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 Depression 01/30/2015  . 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 [place] [address] Phone: [phone] Fax: [phone]     Social Determinants of Health (SDOH) Interventions    Readmission Risk Interventions No flowsheet data found.

## 2019-10-08 NOTE — Progress Notes (Addendum)
PROGRESS NOTE    Dustin [name]  NLZ:[id number] DOB: Oct 14, 1940 DOA: 10/07/2019 PCP: Dustin Amel, MD    No chief complaint on file.   Brief Narrative:  HPI per Dr. [name] is a 79 y.o. male with history of combined CHF, recent right foot fracture, BPH, urinary retention with indwelling Foley, chronic pain, depression, ataxia and restless leg syndrome directed to ED from urology office due to febrile, tachycardia, altered mental status and right foot swelling.  Patient had a fall at home last week. Reportedly fell walking in the kitchen. Reports hitting his head but denies LOC. No prodrome leading to fall. He presented to [facility] mid center found to have right foot fracture with acute urinary retention. He he was discharged with walking boot and indwelling Foley catheter to follow-up with orthopedic surgery and urology.   Patient went to urology office the morning of admission, and found to be febrile, tachycardic and altered. He also had significant swelling and redness in right foot. He was directed to ED for evaluation.  Patient reported worsening redness and pain in his right foot over the course of last week. His pain was 10/10 with minimal weightbearing on right foot. His pain is currently 0/10 resting in bed. He also reports subjective fever. Denied chills, chest pain, dyspnea, nausea, vomiting, abdominal pain or diarrhea. He reports some tremoring in his arms.  Patient denies smoking cigarettes, drinking alcohol recreational drug use.  In ED, HR 119>> 79. RR 20>> 22. Mild temp to 100.2. WBC 9.5. Cr 1.29 (1.3618/12). Hgb 10.1 (12.1 on 8/12). Lactic acid 2.5 but improved to one-point 2:08 0.5 L IVF. UA with moderate Hgb. COVID-19 PCR negative. Right foot x-ray with first through fourth metatarsal and second through third proximal phalangeal fractures. Sepsis code activated. Patient received 2.5 L IVF. Cultures obtained. Started on IV ceftriaxone and hospitalist  service was called for admission. EDP to call orthopedic surgery.   Assessment & Plan:   Principal Problem:   Sepsis due to cellulitis [facility]) Active Problems:   Chronic combined systolic (congestive) and diastolic (congestive) heart failure (HCC)   Cellulitis of right lower extremity   Acute metabolic encephalopathy   Unspecified fracture of right foot, initial encounter for closed fracture   Acute urinary retention   Idiopathic peripheral neuropathy  1 sepsis secondary to right foot cellulitis, POA Patient had presented with symptoms consistent with sepsis secondary to right foot cellulitis.  Patient met criteria for sepsis as patient had presented febrile, tachycardic, tachypneic, noted to have a left shift on CBC with differential, acute metabolic encephalopathy, lactic acidosis.  Physical exam done on admission concerning for right lower extremity cellulitis with erythema, warmth, swelling.  Patient placed on sepsis protocol resuscitated with IV fluids and started on IV Rocephin.  Patient pancultured with results pending.  Although orthopedics feel patient does not have a cellulitis about his right foot and feels secondary to trauma, patient had presented with fever, tachypnea, tachycardia, lactic acidosis, acute metabolic encephalopathy, left shift noted on CBC with clinical improvement after being started on sepsis protocol with IV antibiotics, IV fluids we will continue to treat empirically with antibiotics for total of 5 to 7 days.  Follow.  2.  Acute toxic metabolic encephalopathy Likely multifactorial secondary to sepsis and polypharmacy.  Patient noted to be on multiple sedating medications.  Encephalopathy improved.  Patient placed on reduced home dose gabapentin, Suboxone.  Patient also maintained on home regimen Seroquel.  IV fluids, IV antibiotics, supportive care.  3.  Fall at home/right foot fracture Plain films of the right foot revealing first through fourth metatarsal and  second through third proximal phalangeal fractures.  Orthopedics consulted who recommended CT of the right foot which was done which showed fractures of the proximal first through fourth metatarsal, first and second toe commuted, possible acute fracture through the third distal metatarsal head, small chip fragment from the dorsal aspect of lateral cuneiform, possible nondisplaced fracture of the cuboid, second and third toe proximal phalanx fractures, no obvious bony destruction no suspicious periosteal reaction.  Patient seen in consultation by orthopedics who are recommending short leg nonweightbearing splint, to maintain nonweightbearing until soft tissues are too small to proceed with surgery and therefore plan to likely have this done in the outpatient setting.  Orthopedics recommending patient float his heels and keep right lower extremity elevated on pillows.  PT/OT.  Ultram as needed.  Per orthopedics.  4.  Chronic combined systolic and diastolic CHF Currently euvolemic.  Monitor fluid status with hydration.  Follow.  5.  BPH with acute urinary retention Patient with indwelling Foley catheter inserted last week.  Continue Foley catheter.  Outpatient follow-up with urology.  6.  Chronic pain/peripheral neuropathy Continue home Suboxone and gabapentin at decreased dose.  7.  Depression Seroquel.  8.  Anemia Patient with no overt bleeding.  Check an anemia panel.  Follow H&H.          DVT prophylaxis: Lovenox Code Status: DNR Family Communication: Updated patient.  No family at bedside. Disposition:   Status is: Inpatient    Dispo: The patient is from: Home              Anticipated d/c is to: Likely home with home health.              Anticipated d/c date is: 1 to 2 days.              Patient currently on IV antibiotics due to concerns for sepsis secondary to right foot cellulitis.  Not stable for discharge.       Consultants:   Orthopedics: Dr. Lucia Gaskins  10/07/2019  Procedures:   CT right foot 10/07/2019  Plain films of the right foot 10/07/2019  Chest x-ray 10/07/2019  Antimicrobials:   IV Rocephin 10/07/2019   Subjective: Patient alert.  Denies any chest pain or shortness of breath.  Complaining of headache and right foot pain.  Objective: Vitals:   10/08/19 1707 10/08/19 1742 10/08/19 1826 10/08/19 2010  BP: 132/78 128/74 140/72 128/84  Pulse: (!) 107 99 95 82  Resp: '18 18 16 18  ' Temp: 98.2 F (36.8 C)  98.3 F (36.8 C) 98.3 F (36.8 C)  TempSrc: Oral  Oral Oral  SpO2: 93% 92% 95% 96%  Weight: 86.2 kg  91 kg   Height: '6\' 2"'  (1.88 m)  '6\' 2"'  (1.88 m)    No intake or output data in the 24 hours ending 10/08/19 2055 Filed Weights   10/08/19 1707 10/08/19 1826  Weight: 86.2 kg 91 kg    Examination:  General exam: Appears calm and comfortable  Respiratory system: Clear to auscultation anterior lung fields. Respiratory effort normal. Cardiovascular system: S1 & S2 heard, RRR. No JVD, murmurs, rubs, gallops or clicks. No pedal edema. Gastrointestinal system: Abdomen is nondistended, soft and nontender. No organomegaly or masses felt. Normal bowel sounds heard. Central nervous system: Alert and oriented. No focal neurological deficits. Extremities: Right lower extremity in Ace wrap, short leg splint this  morning.  Skin: No rashes, lesions or ulcers Psychiatry: Judgement and insight appear normal. Mood & affect appropriate.          Data Reviewed: I have personally reviewed following labs and imaging studies  CBC: Recent Labs  Lab 10/07/19 0952 10/08/19 0818  WBC 9.5 5.4  NEUTROABS 8.3* 3.4  HGB 10.4* 9.9*  HCT 32.3* 30.1*  MCV 91.2 91.2  PLT 247 161    Basic Metabolic Panel: Recent Labs  Lab 10/07/19 0952 10/08/19 0818  NA 142 139  K 4.0 3.9  CL 106 102  CO2 26 29  GLUCOSE 135* 109*  BUN 17 13  CREATININE 1.29* 1.10  CALCIUM 8.2* 8.7*    GFR: Estimated Creatinine Clearance: 64.3 mL/min (by  C-G formula based on SCr of 1.1 mg/dL).  Liver Function Tests: Recent Labs  Lab 10/07/19 0952  AST 21  ALT 12  ALKPHOS 57  BILITOT 0.6  PROT 5.9*  ALBUMIN 3.4*    CBG: No results for input(s): GLUCAP in the last 168 hours.   Recent Results (from the past 240 hour(s))  Culture, blood (Routine x 2)     Status: None   Collection Time: 09/30/19 12:53 PM   Specimen: BLOOD  Result Value Ref Range Status   Specimen Description   Final    BLOOD LEFT ANTECUBITAL Performed at [facility], [address] [place], [place] [address]    Special Requests   Final    BOTTLES DRAWN AEROBIC AND ANAEROBIC Blood Culture adequate volume Performed at [facility], [address] [place], [place] [address]    Culture   Final    NO GROWTH 5 DAYS Performed at [facility], [place] [address], [place], [place] [address]    Report Status 10/05/2019 FINAL  Final  Culture, blood (Routine x 2)     Status: None   Collection Time: 09/30/19  1:24 PM   Specimen: BLOOD RIGHT FOREARM  Result Value Ref Range Status   Specimen Description   Final    BLOOD RIGHT FOREARM Performed at [facility], [address] [place], [place] [address]    Special Requests   Final    BOTTLES DRAWN AEROBIC AND ANAEROBIC Blood Culture results may not be optimal due to an inadequate volume of blood received in culture bottles Performed at [facility], [address] [place], [place] [address]    Culture   Final    NO GROWTH 5 DAYS Performed at [facility], [place] [address] [place], [place] [address]    Report Status 10/05/2019 FINAL  Final  SARS Coronavirus 2 by RT PCR (hospital order, performed in [facility] hospital lab) Nasopharyngeal Nasopharyngeal Swab     Status: None   Collection Time: 09/30/19  2:20 PM   Specimen: Nasopharyngeal Swab  Result Value Ref Range Status   SARS Coronavirus 2 NEGATIVE NEGATIVE Final    Comment: (NOTE) SARS-CoV-2 target  nucleic acids are NOT DETECTED.  The SARS-CoV-2 RNA is generally detectable in upper and lower respiratory specimens during the acute phase of infection. The lowest concentration of SARS-CoV-2 viral copies this assay can detect is 250 copies / mL. A negative result does not preclude SARS-CoV-2 infection and should not be used as the sole basis for treatment or other patient management decisions.  A negative result may occur with improper specimen collection / handling, submission of specimen other than nasopharyngeal swab, presence  of viral mutation(s) within the areas targeted by this assay, and inadequate number of viral copies (<250 copies / mL). A negative result must be combined with clinical observations, patient history, and epidemiological information.  Fact Sheet for Patients:   StrictlyIdeas.no  Fact Sheet for Healthcare Providers: BankingDealers.co.za  This test is not yet approved or  cleared by the [place] FDA and has been authorized for detection and/or diagnosis of SARS-CoV-2 by FDA under an Emergency Use Authorization (EUA).  This EUA will remain in effect (meaning this test can be used) for the duration of the COVID-19 declaration under Section 564(b)(1) of the Act, 21 U.S.C. section 360bbb-3(b)(1), unless the authorization is terminated or revoked sooner.  Performed at [facility], [address], [place], [place] [address]   Urine culture     Status: None   Collection Time: 09/30/19  4:03 PM   Specimen: Urine, Random  Result Value Ref Range Status   Specimen Description   Final    URINE, RANDOM Performed at [facility], [address] [place], [place] [address]    Special Requests   Final    NONE Performed at [facility], [address] [place], [place] [address]    Culture   Final    NO GROWTH Performed at [facility], [place] [address] [place], [place]  [address]    Report Status 10/01/2019 FINAL  Final  Blood Culture (routine x 2)     Status: None (Preliminary result)   Collection Time: 10/07/19  9:52 AM   Specimen: BLOOD  Result Value Ref Range Status   Specimen Description   Final    BLOOD RIGHT ANTECUBITAL Performed at [facility] [address], [place], [place] [address]    Special Requests   Final    BOTTLES DRAWN AEROBIC AND ANAEROBIC Blood Culture adequate volume Performed at [place] [address], [place], [place] [address]    Culture   Final    NO GROWTH < 24 HOURS Performed at [facility] [address], [place], [place] [address]    Report Status PENDING  Incomplete  SARS Coronavirus 2 by RT PCR (hospital order, performed in [facility] hospital lab) Nasopharyngeal Nasopharyngeal Swab     Status: None   Collection Time: 10/07/19 11:40 AM   Specimen: Nasopharyngeal Swab  Result Value Ref Range Status   SARS Coronavirus 2 NEGATIVE NEGATIVE Final    Comment: (NOTE) SARS-CoV-2 target nucleic acids are NOT DETECTED.  The SARS-CoV-2 RNA is generally detectable in upper and lower respiratory specimens during the acute phase of infection. The lowest concentration of SARS-CoV-2 viral copies this assay can detect is 250 copies / mL. A negative result does not preclude SARS-CoV-2 infection and should not be used as the sole basis for treatment or other patient management decisions.  A negative result may occur with improper specimen collection / handling, submission of specimen other than nasopharyngeal swab, presence of viral mutation(s) within the areas targeted by this assay, and inadequate number of viral copies (<250 copies / mL). A negative result must be combined with clinical observations, patient history, and epidemiological information.  Fact Sheet for Patients:   StrictlyIdeas.no  Fact Sheet for Healthcare  Providers: BankingDealers.co.za  This test is not yet approved or  cleared by the [place] FDA and has been authorized for detection and/or diagnosis of SARS-CoV-2 by FDA under an Emergency Use  Authorization (EUA).  This EUA will remain in effect (meaning this test can be used) for the duration of the COVID-19 declaration under Section 564(b)(1) of the Act, 21 U.S.C. section 360bbb-3(b)(1), unless the authorization is terminated or revoked sooner.  Performed at [facility], [place] [address] [place], [place] [address]   Urine culture     Status: None   Collection Time: 10/07/19 12:56 PM   Specimen: In/Out Cath Urine  Result Value Ref Range Status   Specimen Description   Final    IN/OUT CATH URINE Performed at [facility] [address] [place], [place] [address]    Special Requests   Final    NONE Performed at [facility], [place] [address] [place], [place] [address]    Culture   Final    NO GROWTH Performed at [facility], [place] [address], [place], [place] [address]    Report Status 10/08/2019 FINAL  Final         Radiology Studies: DG Chest 2 View  Result Date: 10/07/2019 CLINICAL DATA:  Questionable sepsis. Tremor and fever with intermittent confusion EXAM: CHEST - 2 VIEW COMPARISON:  September 30, 2019 FINDINGS: Trachea is midline. Cardiomediastinal contours and hilar structures are normal. Lungs are clear. Subtle increased interstitial markings are noted at the LEFT lung base, likely chronic. No lobar consolidation. No pleural effusion. Skeletal structures on limited assessment are unremarkable. IMPRESSION: Subtle increased interstitial markings at the LEFT lung base may represent scarring, no consolidation or signs of pleural effusion. Electronically Signed   By: Zetta Bills M.D.   On: 10/07/2019 11:18   CT FOOT RIGHT WO CONTRAST  Result Date: 10/07/2019 CLINICAL DATA:  Foot  trauma. Lisfranc suspected. Tarsal metatarsal joint fracture dislocation. EXAM: CT OF THE RIGHT FOOT WITHOUT CONTRAST TECHNIQUE: Multidetector CT imaging of the right foot was performed according to the standard protocol. Multiplanar CT image reconstructions were also generated. COMPARISON:  Radiograph earlier this day.  Radiograph 09/30/2019 FINDINGS: Bones/Joint/Cartilage Comminuted and displaced fracture of the proximal first metatarsal extending into medial cuneiform joint. Displaced proximal second metatarsal fracture also comminuted extending to the tarsal metatarsal joint. Transverse mildly displaced and comminuted fracture involving the base of the third metatarsal without intra-articular extension. Nondisplaced fracture base of the fourth metatarsal. There is a suspected nondisplaced fracture of the cuboid. Small chip fragment from the dorsal aspect of the lateral cuneiform. Fracture of the second toe proximal phalanx extends to the tarsal metatarsal joint. Nondisplaced fracture of the third toe proximal phalanx. Possible acute fracture through the distal third metatarsal head. Suspected remote fracture of the distal fourth metatarsal. No hindfoot fracture. The degree of osteopenia/osteoporosis limits detailed assessment. There is no obvious bony destruction or suspicious periosteal reaction. Ligaments There is no complete Lisfranc ligament disruption. Portions of the ligament are visualized. There are fractures of the adjacent proximal second metatarsal. Muscles and Tendons Fatty atrophy of the intrinsic musculature of the foot. No complete flexor, extensor, or perennial tendon disruption. Soft tissues Generalized subcutaneous soft tissue edema. There is no soft tissue air. No focal hematoma or abscess. IMPRESSION: 1. Fractures of the proximal first through fourth metatarsals, first and second are comminuted. Second metatarsal fracture is in the region of the Lisfranc ligament insertion, however no  complete ligament is disruption. 2. Possible acute fracture through the distal third metatarsal head. 3. Small chip fragment from the dorsal aspect of the lateral cuneiform. Possible nondisplaced fracture of the cuboid. 4. Second and  third toe proximal phalanx fractures. 5. No obvious bony destruction or suspicious periosteal reaction. The degree of osteopenia/osteoporosis limits detailed assessment. Electronically Signed   By: Keith Rake M.D.   On: 10/07/2019 19:37   DG Foot 2 Views Right  Result Date: 10/07/2019 CLINICAL DATA:  Right foot cellulitis, swelling, ecchymosis, recent fracture EXAM: RIGHT FOOT - 2 VIEW COMPARISON:  09/30/2019 FINDINGS: Frontal and lateral views of the right foot are obtained. Comminuted intra-articular fracture at the base of the second proximal phalanx, oblique fracture through the proximal aspect of the third proximal phalanx, and comminuted intra-articular fractures through the bases of the first through fourth metatarsals unchanged. There is diffuse soft tissue edema. No radiographic evidence of osteomyelitis. IMPRESSION: 1. Stable fractures of the second and third proximal phalanges and first through fourth metatarsal bases. 2. Diffuse soft tissue edema. Electronically Signed   By: Randa Ngo M.D.   On: 10/07/2019 15:10        Scheduled Meds: . buprenorphine-naloxone  1 tablet Sublingual BID  . enoxaparin (LOVENOX) injection  40 mg Subcutaneous Q24H  . gabapentin  300 mg Oral BID  . multivitamin with minerals  1 tablet Oral Daily  . pantoprazole  40 mg Oral Daily  . QUEtiapine  400 mg Oral QHS  . rOPINIRole  0.25 mg Oral QPM  . tamsulosin  0.4 mg Oral QPC supper   Continuous Infusions: . cefTRIAXone (ROCEPHIN)  IV Stopped (10/08/19 1142)     LOS: 1 day    Time spent: 40 minutes    Irine Seal, MD [facility]   To contact the attending provider between 7A-7P or the covering provider during after hours 7P-7A, please log into  the web site [email or web address] and access using universal [place] password for that web site. If you do not have the password, please call the hospital operator.  10/08/2019, 8:55 PM

## 2019-10-08 NOTE — ED Notes (Signed)
Left msg for PT, OT to see patient in morning of 8/21.

## 2019-10-08 NOTE — ED Notes (Signed)
Tele

## 2019-10-08 NOTE — ED Notes (Signed)
Wife called and updated on plan of care. Informed of visitation policy.

## 2019-10-09 LAB — BASIC METABOLIC PANEL
Anion gap: 8 (ref 5–15)
BUN: 15 mg/dL (ref 8–23)
CO2: 29 mmol/L (ref 22–32)
Calcium: 8.7 mg/dL — ABNORMAL LOW (ref 8.9–10.3)
Chloride: 102 mmol/L (ref 98–111)
Creatinine, Ser: 1.01 mg/dL (ref 0.61–1.24)
GFR calc Af Amer: >60 mL/min (ref >60)
GFR calc non Af Amer: >60 mL/min (ref >60)
Glucose, Bld: 104 mg/dL — ABNORMAL HIGH (ref 70–99)
Potassium: 3.8 mmol/L (ref 3.5–5.1)
Sodium: 139 mmol/L (ref 135–145)

## 2019-10-09 LAB — CBC WITH DIFFERENTIAL/PLATELET
Abs Immature Granulocytes: 0.03 10*3/uL (ref 0.00–0.07)
Basophils Absolute: 0 10*3/uL (ref 0.0–0.1)
Basophils Relative: 0 %
Eosinophils Absolute: 0.3 10*3/uL (ref 0.0–0.5)
Eosinophils Relative: 5 %
HCT: 27.8 % — ABNORMAL LOW (ref 39.0–52.0)
Hemoglobin: 9.2 g/dL — ABNORMAL LOW (ref 13.0–17.0)
Immature Granulocytes: 1 %
Lymphocytes Relative: 34 %
Lymphs Abs: 1.7 10*3/uL (ref 0.7–4.0)
MCH: 30 pg (ref 26.0–34.0)
MCHC: 33.1 g/dL (ref 30.0–36.0)
MCV: 90.6 fL (ref 80.0–100.0)
Monocytes Absolute: 0.5 10*3/uL (ref 0.1–1.0)
Monocytes Relative: 10 %
Neutro Abs: 2.5 10*3/uL (ref 1.7–7.7)
Neutrophils Relative %: 50 %
Platelets: 182 10*3/uL (ref 150–400)
RBC: 3.07 MIL/uL — ABNORMAL LOW (ref 4.22–5.81)
RDW: 12.7 % (ref 11.5–15.5)
WBC: 5 10*3/uL (ref 4.0–10.5)
nRBC: 0 % (ref 0.0–0.2)

## 2019-10-09 MED ORDER — CHLORHEXIDINE GLUCONATE CLOTH 2 % EX PADS
6.0000 | MEDICATED_PAD | Freq: Every day | CUTANEOUS | Status: DC
  Administered 2019-10-09 – 2019-10-10 (×2): 6 via TOPICAL

## 2019-10-09 MED ORDER — SODIUM CHLORIDE 0.9 % IV SOLN
INTRAVENOUS | Status: DC | PRN
  Administered 2019-10-09: 250 mL via INTRAVENOUS

## 2019-10-09 NOTE — Progress Notes (Signed)
PROGRESS NOTE    Dustin [name]  NIO:[id number] DOB: 06-17-1940 DOA: 10/07/2019 PCP: Lujean Amel, MD    No chief complaint on file.   Brief Narrative:  HPI per Dr. [name] is a 79 y.o. male with history of combined CHF, recent right foot fracture, BPH, urinary retention with indwelling Foley, chronic pain, depression, ataxia and restless leg syndrome directed to ED from urology office due to febrile, tachycardia, altered mental status and right foot swelling.  Patient had a fall at home last week. Reportedly fell walking in the kitchen. Reports hitting his head but denies LOC. No prodrome leading to fall. He presented to [facility] mid center found to have right foot fracture with acute urinary retention. He he was discharged with walking boot and indwelling Foley catheter to follow-up with orthopedic surgery and urology.   Patient went to urology office the morning of admission, and found to be febrile, tachycardic and altered. He also had significant swelling and redness in right foot. He was directed to ED for evaluation.  Patient reported worsening redness and pain in his right foot over the course of last week. His pain was 10/10 with minimal weightbearing on right foot. His pain is currently 0/10 resting in bed. He also reports subjective fever. Denied chills, chest pain, dyspnea, nausea, vomiting, abdominal pain or diarrhea. He reports some tremoring in his arms.  Patient denies smoking cigarettes, drinking alcohol recreational drug use.  In ED, HR 119>> 79. RR 20>> 22. Mild temp to 100.2. WBC 9.5. Cr 1.29 (1.3618/12). Hgb 10.1 (12.1 on 8/12). Lactic acid 2.5 but improved to one-point 2:08 0.5 L IVF. UA with moderate Hgb. COVID-19 PCR negative. Right foot x-ray with first through fourth metatarsal and second through third proximal phalangeal fractures. Sepsis code activated. Patient received 2.5 L IVF. Cultures obtained. Started on IV ceftriaxone and hospitalist  service was called for admission. EDP to call orthopedic surgery.   Assessment & Plan:   Principal Problem:   Sepsis due to cellulitis [facility]) Active Problems:   Chronic combined systolic (congestive) and diastolic (congestive) heart failure (HCC)   Cellulitis of right lower extremity   Acute metabolic encephalopathy   Unspecified fracture of right foot, initial encounter for closed fracture   Acute urinary retention   Idiopathic peripheral neuropathy  1 sepsis secondary to right foot cellulitis, POA Patient had presented with symptoms consistent with sepsis secondary to right foot cellulitis.  Patient met criteria for sepsis as patient had presented febrile, tachycardic, tachypneic, noted to have a left shift on CBC with differential, acute metabolic encephalopathy, lactic acidosis.  Physical exam done on admission concerning for right lower extremity cellulitis with erythema, warmth, swelling.  Patient placed on sepsis protocol resuscitated with IV fluids and started on IV Rocephin.  Patient pancultured with results pending.  Although orthopedics feel patient does not have a cellulitis about his right foot and feels secondary to trauma, patient had presented with fever, tachypnea, tachycardia, lactic acidosis, acute metabolic encephalopathy, left shift noted on CBC with clinical improvement after being started on sepsis protocol with IV antibiotics, IV fluids we will continue to treat empirically with antibiotics for total of 5 to 7 days.  Could likely transition to oral antibiotics on discharge.  Follow.  2.  Acute toxic metabolic encephalopathy Likely multifactorial secondary to sepsis and polypharmacy.  Patient noted to be on multiple sedating medications.  Encephalopathy improved.  Patient placed on reduced home dose gabapentin, Suboxone.  Patient also maintained on home  regimen Seroquel.  IV fluids, IV antibiotics, supportive care.  3.  Fall at home/right foot fracture Plain films of the  right foot revealing first through fourth metatarsal and second through third proximal phalangeal fractures.  Orthopedics consulted who recommended CT of the right foot which was done which showed fractures of the proximal first through fourth metatarsal, first and second toe commuted, possible acute fracture through the third distal metatarsal head, small chip fragment from the dorsal aspect of lateral cuneiform, possible nondisplaced fracture of the cuboid, second and third toe proximal phalanx fractures, no obvious bony destruction no suspicious periosteal reaction.  Patient seen in consultation by orthopedics who are recommending short leg nonweightbearing splint, to maintain nonweightbearing until soft tissues are too small to proceed with surgery and therefore plan to likely have this done in the outpatient setting.  Orthopedics recommending patient float his heels and keep right lower extremity elevated on pillows.  PT/OT.  Ultram as needed.  Per orthopedics.  4.  Chronic combined systolic and diastolic CHF Currently euvolemic.  Monitor fluid status with hydration.  Follow.  5.  BPH with acute urinary retention Patient with indwelling Foley catheter inserted last week.  Voiding trial.  DC Foley catheter.  Continue Flomax.  If patient fails voiding trial will place Foley catheter in and will need outpatient follow-up with urology.    6.  Chronic pain/peripheral neuropathy Continue home Suboxone and gabapentin at decreased dose.  7.  Depression Continue Seroquel.  8.  Anemia of chronic disease Patient with no overt bleeding.  Anemia panel consistent with anemia of chronic disease.  Hemoglobin currently at 9.2.  Follow.           DVT prophylaxis: Lovenox Code Status: DNR Family Communication: Updated patient.  No family at bedside. Disposition:   Status is: Inpatient    Dispo: The patient is from: Home              Anticipated d/c is to: Likely home with home health.               Anticipated d/c date is: Hopefully tomorrow..              Patient currently on IV antibiotics due to concerns for sepsis secondary to right foot cellulitis.  Also with urinary retention undergoing voiding trial.  Not stable for discharge.       Consultants:   Orthopedics: Dr. Lucia Gaskins 10/07/2019  Procedures:   CT right foot 10/07/2019  Plain films of the right foot 10/07/2019  Chest x-ray 10/07/2019  Antimicrobials:   IV Rocephin 10/07/2019   Subjective: Alert.  Denies chest pain or shortness of breath.  Pain currently controlled.  Foley catheter in place.    Objective: Vitals:   10/08/19 2010 10/09/19 0236 10/09/19 0520 10/09/19 0552  BP: 128/84 (!) 100/52  (!) 115/56  Pulse: 82 80  72  Resp: '18 18  18  ' Temp: 98.3 F (36.8 C) 98.1 F (36.7 C)  97.8 F (36.6 C)  TempSrc: Oral Oral  Oral  SpO2: 96% 92%  94%  Weight:   90.8 kg   Height:        Intake/Output Summary (Last 24 hours) at 10/09/2019 1129 Last data filed at 10/09/2019 1031 Gross per 24 hour  Intake 180 ml  Output 750 ml  Net -570 ml   Filed Weights   10/08/19 1707 10/08/19 1826 10/09/19 0520  Weight: 86.2 kg 91 kg 90.8 kg    Examination:  General exam: NAD  Respiratory system: CTAB.  No wheezes, no crackles, no rhonchi.  Normal respiratory effort.  Cardiovascular system: Regular rate rhythm no murmurs rubs or gallops.  No JVD.  No lower extremity edema.  Gastrointestinal system: Abdomen is soft, nontender, nondistended, positive bowel sounds.  No rebound.  No guarding.  Central nervous system: Alert and oriented. No focal neurological deficits. Extremities: Right lower extremity in Ace wrap, short leg splint this morning.  Skin: No rashes, lesions or ulcers Psychiatry: Judgement and insight appear normal. Mood & affect appropriate.          Data Reviewed: I have personally reviewed following labs and imaging studies  CBC: Recent Labs  Lab 10/07/19 0952 10/08/19 0818 10/09/19 0606  WBC  9.5 5.4 5.0  NEUTROABS 8.3* 3.4 2.5  HGB 10.4* 9.9* 9.2*  HCT 32.3* 30.1* 27.8*  MCV 91.2 91.2 90.6  PLT 247 177 832    Basic Metabolic Panel: Recent Labs  Lab 10/07/19 0952 10/08/19 0818 10/09/19 0606  NA 142 139 139  K 4.0 3.9 3.8  CL 106 102 102  CO2 '26 29 29  ' GLUCOSE 135* 109* 104*  BUN '17 13 15  ' CREATININE 1.29* 1.10 1.01  CALCIUM 8.2* 8.7* 8.7*    GFR: Estimated Creatinine Clearance: 70.1 mL/min (by C-G formula based on SCr of 1.01 mg/dL).  Liver Function Tests: Recent Labs  Lab 10/07/19 0952  AST 21  ALT 12  ALKPHOS 57  BILITOT 0.6  PROT 5.9*  ALBUMIN 3.4*    CBG: No results for input(s): GLUCAP in the last 168 hours.   Recent Results (from the past 240 hour(s))  Culture, blood (Routine x 2)     Status: None   Collection Time: 09/30/19 12:53 PM   Specimen: BLOOD  Result Value Ref Range Status   Specimen Description   Final    BLOOD LEFT ANTECUBITAL Performed at [facility], [address] [place], [place] [address]    Special Requests   Final    BOTTLES DRAWN AEROBIC AND ANAEROBIC Blood Culture adequate volume Performed at [facility], [address] [place], [place] [address]    Culture   Final    NO GROWTH 5 DAYS Performed at [facility], [place] [address], [place],  [address]    Report Status 10/05/2019 FINAL  Final  Culture, blood (Routine x 2)     Status: None   Collection Time: 09/30/19  1:24 PM   Specimen: BLOOD RIGHT FOREARM  Result Value Ref Range Status   Specimen Description   Final    BLOOD RIGHT FOREARM Performed at [facility], [address] [place], [place] [address]    Special Requests   Final    BOTTLES DRAWN AEROBIC AND ANAEROBIC Blood Culture results may not be optimal due to an inadequate volume of blood received in culture bottles Performed at [facility], [address] [place], [place] [address]    Culture   Final    NO GROWTH 5 DAYS Performed at  [facility], [place] [address], [place],  [address]    Report Status 10/05/2019 FINAL  Final  SARS Coronavirus 2 by RT PCR (hospital order, performed in [facility] hospital lab) Nasopharyngeal Nasopharyngeal Swab     Status: None   Collection Time: 09/30/19  2:20 PM   Specimen: Nasopharyngeal Swab  Result Value Ref Range Status   SARS Coronavirus 2 NEGATIVE NEGATIVE Final  Comment: (NOTE) SARS-CoV-2 target nucleic acids are NOT DETECTED.  The SARS-CoV-2 RNA is generally detectable in upper and lower respiratory specimens during the acute phase of infection. The lowest concentration of SARS-CoV-2 viral copies this assay can detect is 250 copies / mL. A negative result does not preclude SARS-CoV-2 infection and should not be used as the sole basis for treatment or other patient management decisions.  A negative result may occur with improper specimen collection / handling, submission of specimen other than nasopharyngeal swab, presence of viral mutation(s) within the areas targeted by this assay, and inadequate number of viral copies (<250 copies / mL). A negative result must be combined with clinical observations, patient history, and epidemiological information.  Fact Sheet for Patients:   StrictlyIdeas.no  Fact Sheet for Healthcare Providers: BankingDealers.co.za  This test is not yet approved or  cleared by the [place] FDA and has been authorized for detection and/or diagnosis of SARS-CoV-2 by FDA under an Emergency Use Authorization (EUA).  This EUA will remain in effect (meaning this test can be used) for the duration of the COVID-19 declaration under Section 564(b)(1) of the Act, 21 U.S.C. section 360bbb-3(b)(1), unless the authorization is terminated or revoked sooner.  Performed at [facility], [address] [place], [place] [address]   Urine culture     Status: None   Collection Time:  09/30/19  4:03 PM   Specimen: Urine, Random  Result Value Ref Range Status   Specimen Description   Final    URINE, RANDOM Performed at [facility], [address] [place], [place] [address]    Special Requests   Final    NONE Performed at [facility], [address] [place], [place] [address]    Culture   Final    NO GROWTH Performed at [facility], [place] [address] [place], [place] [address]    Report Status 10/01/2019 FINAL  Final  Blood Culture (routine x 2)     Status: None (Preliminary result)   Collection Time: 10/07/19  9:52 AM   Specimen: BLOOD  Result Value Ref Range Status   Specimen Description   Final    BLOOD RIGHT ANTECUBITAL Performed at [facility] [address] [place], [place] [address]    Special Requests   Final    BOTTLES DRAWN AEROBIC AND ANAEROBIC Blood Culture adequate volume Performed at [facility] [address], [place], [place] [address]    Culture   Final    NO GROWTH < 24 HOURS Performed at [facility] [address], [place], [place] [address]    Report Status PENDING  Incomplete  SARS Coronavirus 2 by RT PCR (hospital order, performed in [facility] hospital lab) Nasopharyngeal Nasopharyngeal Swab     Status: None   Collection Time: 10/07/19 11:40 AM   Specimen: Nasopharyngeal Swab  Result Value Ref Range Status   SARS Coronavirus 2 NEGATIVE NEGATIVE Final    Comment: (NOTE) SARS-CoV-2 target nucleic acids are NOT DETECTED.  The SARS-CoV-2 RNA is generally detectable in upper and lower respiratory specimens during the acute phase of infection. The lowest concentration of SARS-CoV-2 viral copies this assay can detect is 250 copies / mL. A negative result does not preclude SARS-CoV-2 infection and should not be used as the sole basis for treatment or other patient management decisions.  A negative result may occur with improper specimen collection /  handling,  submission of specimen other than nasopharyngeal swab, presence of viral mutation(s) within the areas targeted by this assay, and inadequate number of viral copies (<250 copies / mL). A negative result must be combined with clinical observations, patient history, and epidemiological information.  Fact Sheet for Patients:   StrictlyIdeas.no  Fact Sheet for Healthcare Providers: BankingDealers.co.za  This test is not yet approved or  cleared by the [place] FDA and has been authorized for detection and/or diagnosis of SARS-CoV-2 by FDA under an Emergency Use Authorization (EUA).  This EUA will remain in effect (meaning this test can be used) for the duration of the COVID-19 declaration under Section 564(b)(1) of the Act, 21 U.S.C. section 360bbb-3(b)(1), unless the authorization is terminated or revoked sooner.  Performed at [facility], [place] [address] [place], [place] [address]   Urine culture     Status: None   Collection Time: 10/07/19 12:56 PM   Specimen: In/Out Cath Urine  Result Value Ref Range Status   Specimen Description   Final    IN/OUT CATH URINE Performed at [facility] [address], [place], [place] [address]    Special Requests   Final    NONE Performed at [facility], [place] [address] [place], [place] [address]    Culture   Final    NO GROWTH Performed at [facility], [place] [address] [place], [place] [address]    Report Status 10/08/2019 FINAL  Final         Radiology Studies: CT FOOT RIGHT WO CONTRAST  Result Date: 10/07/2019 CLINICAL DATA:  Foot trauma. Lisfranc suspected. Tarsal metatarsal joint fracture dislocation. EXAM: CT OF THE RIGHT FOOT WITHOUT CONTRAST TECHNIQUE: Multidetector CT imaging of the right foot was performed according to the standard protocol. Multiplanar CT image reconstructions were also generated.  COMPARISON:  Radiograph earlier this day.  Radiograph 09/30/2019 FINDINGS: Bones/Joint/Cartilage Comminuted and displaced fracture of the proximal first metatarsal extending into medial cuneiform joint. Displaced proximal second metatarsal fracture also comminuted extending to the tarsal metatarsal joint. Transverse mildly displaced and comminuted fracture involving the base of the third metatarsal without intra-articular extension. Nondisplaced fracture base of the fourth metatarsal. There is a suspected nondisplaced fracture of the cuboid. Small chip fragment from the dorsal aspect of the lateral cuneiform. Fracture of the second toe proximal phalanx extends to the tarsal metatarsal joint. Nondisplaced fracture of the third toe proximal phalanx. Possible acute fracture through the distal third metatarsal head. Suspected remote fracture of the distal fourth metatarsal. No hindfoot fracture. The degree of osteopenia/osteoporosis limits detailed assessment. There is no obvious bony destruction or suspicious periosteal reaction. Ligaments There is no complete Lisfranc ligament disruption. Portions of the ligament are visualized. There are fractures of the adjacent proximal second metatarsal. Muscles and Tendons Fatty atrophy of the intrinsic musculature of the foot. No complete flexor, extensor, or perennial tendon disruption. Soft tissues Generalized subcutaneous soft tissue edema. There is no soft tissue air. No focal hematoma or abscess. IMPRESSION: 1. Fractures of the proximal first through fourth metatarsals, first and second are comminuted. Second metatarsal fracture is in the region of the Lisfranc ligament insertion, however no complete ligament is disruption. 2. Possible acute fracture through the distal third metatarsal head. 3. Small chip fragment from the dorsal aspect of the lateral cuneiform. Possible nondisplaced fracture of the cuboid. 4. Second and third toe proximal phalanx fractures. 5. No obvious  bony destruction or suspicious periosteal reaction. The degree of  osteopenia/osteoporosis limits detailed assessment. Electronically Signed   By: Keith Rake M.D.   On: 10/07/2019 19:37   DG Foot 2 Views Right  Result Date: 10/07/2019 CLINICAL DATA:  Right foot cellulitis, swelling, ecchymosis, recent fracture EXAM: RIGHT FOOT - 2 VIEW COMPARISON:  09/30/2019 FINDINGS: Frontal and lateral views of the right foot are obtained. Comminuted intra-articular fracture at the base of the second proximal phalanx, oblique fracture through the proximal aspect of the third proximal phalanx, and comminuted intra-articular fractures through the bases of the first through fourth metatarsals unchanged. There is diffuse soft tissue edema. No radiographic evidence of osteomyelitis. IMPRESSION: 1. Stable fractures of the second and third proximal phalanges and first through fourth metatarsal bases. 2. Diffuse soft tissue edema. Electronically Signed   By: Randa Ngo M.D.   On: 10/07/2019 15:10        Scheduled Meds: . buprenorphine-naloxone  1 tablet Sublingual BID  . Chlorhexidine Gluconate Cloth  6 each Topical Daily  . enoxaparin (LOVENOX) injection  40 mg Subcutaneous Q24H  . gabapentin  300 mg Oral BID  . multivitamin with minerals  1 tablet Oral Daily  . pantoprazole  40 mg Oral Daily  . QUEtiapine  400 mg Oral QHS  . rOPINIRole  0.25 mg Oral QPM  . tamsulosin  0.4 mg Oral QPC supper   Continuous Infusions: . sodium chloride 250 mL (10/09/19 1017)  . cefTRIAXone (ROCEPHIN)  IV 2 g (10/09/19 1019)     LOS: 2 days    Time spent: 40 minutes    Irine Seal, MD [facility]   To contact the attending provider between 7A-7P or the covering provider during after hours 7P-7A, please log into the web site [email or web address] and access using universal [place] password for that web site. If you do not have the password, please call the hospital operator.  10/09/2019, 11:29 AM

## 2019-10-09 NOTE — Progress Notes (Signed)
     Dustin [name] is a 79 y.o. male   Orthopaedic diagnosis: Right midfoot fractures with phalanx fractures  Subjective: Patient is resting comfortably.  The splint is comfortable.  He did have a CT scan.  They are talking about discharge later today.  Denies shortness of breath.  Objectyive: Vitals:   10/09/19 0236 10/09/19 0552  BP: (!) 100/52 (!) 115/56  Pulse: 80 72  Resp: 18 18  Temp: 98.1 F (36.7 C) 97.8 F (36.6 C)  SpO2: 92% 94%     Exam: Awake and alert Respirations even and unlabored No acute distress  Right foot in a short leg splint.  There is ecchymosis about the toes.  Endorse sensation about the toes.  Toes warm well perfused.  No tenderness proximal to the splint.  Assessment: Right midfoot fractures with acceptable alignment on CT scan   Plan: We will plan to treat his fractures without surgery.  We did discuss the possibility of development of midfoot arthritis given the intra-articular nature of the fractures.  He will maintain nonweightbearing in the splint.  Suitable for discharge from orthopedic standpoint.  I will have him follow-up in 2 weeks for repeat x-rays of the right foot out of the splint to ensure no change in position.  He is in agreement with this plan.   Radene Journey, MD

## 2019-10-09 NOTE — Evaluation (Signed)
Physical Therapy Evaluation Patient Details Name: Dustin [name] MRN: [id number] DOB: 08-17-1940 Today's Date: 10/09/2019   History of Present Illness  79 y.o. male with history of combined CHF, indwelling Foley, chronic pain, depression, ataxia and restless leg syndrome; directed to ED from neurology office due to febrile, tachycardia, altered mental status and right foot swelling with multiple fractures in R foot and is now NWB with splint.  Clinical Impression  Pt admitted with above diagnosis. Pt with good UE strength for hop to pattern using RW, but fatigues with distance requiring BSC for toileting due to unable to make it to restroom. Pt able to maintain RLE NWB restrictions with good carryover. Pt requires min A from low seated chair to rise, only min G to rise from elevated bed and BSC pushing off from seated surface with cues. Pt with son and DIL at home, has ramped entrance and manual WC accessible at home; pt would benefit from RW and BSC due to strength and activity tolerance limitations due to NWB RLE and HHPT to return to PLOF and decrease risk for falls. Pt reports ascending/descending steps at home to pay bills on computer on his bottom since initially injured foot without putting weight on RLE and without difficulty; will continue to assess during hospital stay if concerns arise. Pt currently with functional limitations due to the deficits listed below (see PT Problem List). Pt will benefit from skilled PT to increase their independence and safety with mobility to allow discharge to the venue listed below.       Follow Up Recommendations Home health PT;Supervision for mobility/OOB    Equipment Recommendations  Rolling walker with 5" wheels;3in1 (PT)    Recommendations for Other Services       Precautions / Restrictions Precautions Precautions: Fall Precaution Comments: multiple R foot fractures Required Braces or Orthoses: Splint/Cast Splint/Cast: RLE short leg  splint Restrictions Weight Bearing Restrictions: Yes RLE Weight Bearing: Non weight bearing      Mobility  Bed Mobility Overal bed mobility: Needs Assistance Bed Mobility: Supine to Sit;Sit to Supine  Supine to sit: Supervision;HOB elevated Sit to supine: Supervision   General bed mobility comments: increased time using bedrails and elevated HOB to come to sitting EOB, no physical assist from therapist; returns to supine with SUPV, able to lift BLE Back into bed without physical assist  Transfers Overall transfer level: Needs assistance Equipment used: Rolling walker (2 wheeled) Transfers: Sit to/from [employer] Transfers Sit to Stand: Min assist;Min guard Stand pivot transfers: Min assist  General transfer comment: min A from low seated surfaces to power up, cues for hand placement off of seated surface to rise; min G off elevated bed and BSC armrests with cues to maintain RLE NWB  Ambulation/Gait Ambulation/Gait assistance: Min guard Gait Distance (Feet): 10 Feet Assistive device: Rolling walker (2 wheeled) Gait Pattern/deviations: Decreased stride length Gait velocity: decreased   General Gait Details: hop to pattern with R LE NWB, good steadiness, fatigued with distance and unable to make it to restroom requiring BSC for toileting, good BUE strength and able to push through RW without overt LOB  Stairs            Wheelchair Mobility    Modified Rankin (Stroke Patients Only)       Balance Overall balance assessment: Needs assistance Sitting-balance support: Feet supported;No upper extremity supported Sitting balance-Leahy Scale: Good Sitting balance - Comments: seated EOB with LLE resting on floor, RLE NWB   Standing balance support: During  functional activity;Bilateral upper extremity supported Standing balance-Leahy Scale: Poor Standing balance comment: reliant on RW for steadying and L SLS          Pertinent Vitals/Pain Pain Assessment:  0-10 Pain Score: 1  Pain Location: RLE Pain Intervention(s): Limited activity within patient's tolerance;Monitored during session;Premedicated before session    Home Living Family/patient expects to be discharged to:: Private residence Living Arrangements: Spouse/significant other;Children (Son and DIL live with pt and spouse) Available Help at Discharge: Family;Available 24 hours/day Type of Home: House Home Access: Ramped entrance     Home Layout: Two level;Able to live on main level with bedroom/bathroom Home Equipment: Grab bars - tub/shower;Hand held shower head;Shower seat;Wheelchair - manual      Prior Function Level of Independence: Independent         Comments: Pt reports independent with ADLs, community ambulation without AD, drives, completes yard work. 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 providing meals, grocery shopping and cleaning, but son and his DIL are currently living with pt and providing assistance.     Hand Dominance        Extremity/Trunk Assessment   Upper Extremity Assessment Upper Extremity Assessment: Defer to OT evaluation    Lower Extremity Assessment Lower Extremity Assessment: RLE deficits/detail RLE Deficits / Details: short leg splint on RLE; ankle not assessed; hip/knee AROM WNL and functionally strength 3+/5, wiggling toes    Cervical / Trunk Assessment Cervical / Trunk Assessment: Normal  Communication   Communication: HOH  Cognition Arousal/Alertness: Awake/alert Behavior During Therapy: WFL for tasks assessed/performed Overall Cognitive Status: Within Functional Limits for tasks assessed   General Comments: Pt A&O x4      General Comments      Exercises     Assessment/Plan    PT Assessment Patient needs continued PT services  PT Problem List Decreased strength;Decreased range of motion;Decreased activity tolerance;Decreased balance;Decreased mobility;Decreased knowledge of use of DME;Pain       PT Treatment  Interventions DME instruction;Gait training;Stair training;Functional mobility training;Therapeutic activities;Therapeutic exercise;Balance training;Neuromuscular re-education;Patient/family education    PT Goals (Current goals can be found in the Care Plan section)  Acute Rehab PT Goals Patient Stated Goal: "get back home" PT Goal Formulation: With patient Time For Goal Achievement: 10/23/19 Potential to Achieve Goals: Good    Frequency Min 3X/week   Barriers to discharge        Co-evaluation               AM-PAC PT "6 Clicks" Mobility  Outcome Measure Help needed turning from your back to your side while in a flat bed without using bedrails?: None Help needed moving from lying on your back to sitting on the side of a flat bed without using bedrails?: None Help needed moving to and from a bed to a chair (including a wheelchair)?: A Little Help needed standing up from a chair using your arms (e.g., wheelchair or bedside chair)?: A Little Help needed to walk in hospital room?: A Little Help needed climbing 3-5 steps with a railing? : A Lot 6 Click Score: 19    End of Session Equipment Utilized During Treatment: Gait belt Activity Tolerance: Patient tolerated treatment well;Patient limited by fatigue Patient left: in bed;with call bell/phone within reach;with bed alarm set Nurse Communication: Mobility status;Weight bearing status PT Visit Diagnosis: Other abnormalities of gait and mobility (R26.89);Muscle weakness (generalized) (M62.81);Pain Pain - Right/Left: Right Pain - part of body: Ankle and joints of foot    Time: [phone]  PT Time Calculation (min) (ACUTE ONLY): 34 min   Charges:   PT Evaluation $PT Eval Low Complexity: 1 Low PT Treatments $Gait Training: 8-22 mins         Tori Sincerity Cedar PT, DPT 10/09/19, 9:30 AM

## 2019-10-09 NOTE — Progress Notes (Addendum)
Foley removed at 1130. Pt tried twice to urinate. Bladder scan volume of 143 ml. Fluids encouraged. Irine Seal, MD aware.  Bladder scan volume of 350 ml. Pt tried to urinate again, was unsuccessful. MD made aware.  Per MD at 1800, give the pt 2-3 more hours to urinate before straight cath. Flomax has been given. Verbal orders given to bladder scan q4 hours.

## 2019-10-09 NOTE — Progress Notes (Signed)
Foley removed per Irine Seal, MD. Pt tolerated well.

## 2019-10-09 NOTE — Progress Notes (Signed)
Occupational Therapy Evaluation Patient Details Name: Dustin [name] MRN: [id number] DOB: 03-15-1940 Today's Date: 10/09/2019    History of Present Illness 79 y.o. male with history of combined CHF, indwelling Foley, chronic pain, depression, ataxia and restless leg syndrome; directed to ED from neurology office due to febrile, tachycardia, altered mental status and right foot swelling with multiple fractures in R foot and is now NWB with splint.   Clinical Impression   PTA pt PLOF living at home with wife as caretaker, I with ADLs and IADLs, retired but still drives. Pt currently limited with safety with ADLs and IADLs due to increased edema, pain, weakness, and standing stability for functional engagement. Pt will benefit from continued acute OT to address pain management, strength, compensatory techs, and AE education to maximize independence prior to returning home with [place].     Follow Up Recommendations  Home health OT    Equipment Recommendations  3 in 1 bedside commode    Recommendations for Other Services       Precautions / Restrictions Precautions Precautions: Fall Precaution Comments: multiple R foot fractures Required Braces or Orthoses: Splint/Cast Splint/Cast: RLE short leg splint Restrictions Weight Bearing Restrictions: Yes RLE Weight Bearing: Non weight bearing      Mobility Bed Mobility Overal bed mobility: Needs Assistance Bed Mobility: Supine to Sit;Sit to Supine     Supine to sit: Supervision;HOB elevated Sit to supine: Supervision   General bed mobility comments: increased time to present BLE to EOB and use of bed rails. no physical assistance required mostly for safety with sequence.  Transfers Overall transfer level: Needs assistance Equipment used: Rolling walker (2 wheeled) Transfers: Sit to/from [employer] Sit to Stand: Min assist;Min guard Stand pivot transfers: Min assist       General transfer comment: min A from  low seated surfaces to power up, cues for hand placement off of seated surface to rise; min G off elevated bed and BSC armrests with cues to maintain RLE NWB    Balance Overall balance assessment: Needs assistance Sitting-balance support: Feet supported;No upper extremity supported Sitting balance-Leahy Scale: Good Sitting balance - Comments: seated EOB with LLE resting on floor, RLE NWB   Standing balance support: During functional activity;Bilateral upper extremity supported Standing balance-Leahy Scale: Poor Standing balance comment: reliant on RW for steadying and L SLS                           ADL either performed or assessed with clinical judgement   ADL Overall ADL's : Needs assistance/impaired Eating/Feeding: Independent;Sitting   Grooming: Wash/dry hands;Wash/dry face;Oral care;Set up;Sitting   Upper Body Bathing: Modified independent;Sitting   Lower Body Bathing: Modified independent;Sitting/lateral leans   Upper Body Dressing : Modified independent;Sitting   Lower Body Dressing: Modified independent;Sitting/lateral leans Lower Body Dressing Details (indicate cue type and reason): Pt able to don/doff sock seated EOB and at bed level in long sitting. Toilet Transfer: Minimal Print production planner Details (indicate cue type and reason): simulated toilet transfer [place] and Hygiene: Supervision/safety;Sitting/lateral lean       Functional mobility during ADLs: Minimal assistance General ADL Comments: Pt demonstrates good UE strength for functional transistions with RW and to adhere to NWB status.      Vision         Perception     Praxis      Pertinent Vitals/Pain Pain Assessment: 0-10 Pain Score: 3  Pain Location: RLE Pain Intervention(s):  Limited activity within patient's tolerance;Monitored during session;Repositioned;Premedicated before session     Hand Dominance Left   Extremity/Trunk Assessment Upper  Extremity Assessment Upper Extremity Assessment: Overall WFL for tasks assessed   Lower Extremity Assessment Lower Extremity Assessment: Defer to PT evaluation RLE Deficits / Details: short leg splint on RLE; ankle not assessed; hip/knee AROM WNL and functionally strength 3+/5, wiggling toes   Cervical / Trunk Assessment Cervical / Trunk Assessment: Normal   Communication Communication Communication: HOH (L hearing aide currently working.)   Cognition Arousal/Alertness: Awake/alert Behavior During Therapy: WFL for tasks assessed/performed Overall Cognitive Status: Within Functional Limits for tasks assessed                                 General Comments: Pt A&O x4   General Comments       Exercises     Shoulder Instructions      Home Living Family/patient expects to be discharged to:: Private residence Living Arrangements: Spouse/significant other;Children (Son and DIL live with pt and spouse) Available Help at Discharge: Family;Available 24 hours/day Type of Home: House Home Access: Ramped entrance     Home Layout: Two level;Able to live on main level with bedroom/bathroom Alternate Level Stairs-Number of Steps: Flight Alternate Level Stairs-Rails: Left Bathroom Shower/Tub: Walk-in shower         Home Equipment: Grab bars - tub/shower;Hand held shower head;Shower seat;Wheelchair - manual   Additional Comments: reports son and daughter available to assist with wife, but able to support if needed.      Prior Functioning/Environment Level of Independence: Independent        Comments: Pt reports independent with ADLs, community ambulation without AD, drives, completes yard work. 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 providing meals, grocery shopping and cleaning, but son and his DIL are currently living with pt and providing assistance.        OT Problem List: Impaired balance (sitting and/or standing);Decreased safety awareness;Decreased  knowledge of use of DME or AE;Decreased knowledge of precautions;Pain;Increased edema      OT Treatment/Interventions: Self-care/ADL training;Therapeutic exercise;DME and/or AE instruction;Therapeutic activities;Patient/family education;Balance training    OT Goals(Current goals can be found in the care plan section) Acute Rehab OT Goals Patient Stated Goal: "get back home" OT Goal Formulation: With patient Time For Goal Achievement: 10/23/19 Potential to Achieve Goals: Fair  OT Frequency: Min 2X/week   Barriers to D/C:            Co-evaluation              AM-PAC OT "6 Clicks" Daily Activity     Outcome Measure Help from another person eating meals?: None Help from another person taking care of personal grooming?: None Help from another person toileting, which includes using toliet, bedpan, or urinal?: A Little Help from another person bathing (including washing, rinsing, drying)?: A Little Help from another person to put on and taking off regular upper body clothing?: None Help from another person to put on and taking off regular lower body clothing?: A Little 6 Click Score: 21   End of Session Equipment Utilized During Treatment: Gait belt;Rolling walker Nurse Communication: Mobility status;Weight bearing status  Activity Tolerance: Patient tolerated treatment well Patient left: in bed;with bed alarm set;with call bell/phone within reach  OT Visit Diagnosis: Unsteadiness on feet (R26.81);Muscle weakness (generalized) (M62.81);History of falling (Z91.81);Pain Pain - Right/Left: Right Pain - part of body: Ankle  and joints of foot                Time: 2429-9806 OT Time Calculation (min): 12 min Charges:  OT General Charges $OT Visit: 1 Visit OT Evaluation $OT Eval Low Complexity: [place], MSOT, OTR/L  [facility]  [phone]   Marius Ditch 10/09/2019, 10:49 AM

## 2019-10-09 NOTE — Progress Notes (Signed)
Patient tried voiding at approximately 1945 but was unable to. B;adder scan done at 1950 for 423cc. Tried to I&O cath with regular cath but was unable to advance cath. At approximately 2010 a coude cath was used and 500cc of yellow urine returned. Patient tolerated procedure well. Post cath bladder scan done for 18cc.

## 2019-10-10 LAB — CBC WITH DIFFERENTIAL/PLATELET
Abs Immature Granulocytes: 0.07 10*3/uL (ref 0.00–0.07)
Basophils Absolute: 0 10*3/uL (ref 0.0–0.1)
Basophils Relative: 0 %
Eosinophils Absolute: 0.3 10*3/uL (ref 0.0–0.5)
Eosinophils Relative: 6 %
HCT: 27.9 % — ABNORMAL LOW (ref 39.0–52.0)
Hemoglobin: 9.2 g/dL — ABNORMAL LOW (ref 13.0–17.0)
Immature Granulocytes: 2 %
Lymphocytes Relative: 38 %
Lymphs Abs: 1.8 10*3/uL (ref 0.7–4.0)
MCH: 29.6 pg (ref 26.0–34.0)
MCHC: 33 g/dL (ref 30.0–36.0)
MCV: 89.7 fL (ref 80.0–100.0)
Monocytes Absolute: 0.5 10*3/uL (ref 0.1–1.0)
Monocytes Relative: 11 %
Neutro Abs: 2 10*3/uL (ref 1.7–7.7)
Neutrophils Relative %: 43 %
Platelets: 181 10*3/uL (ref 150–400)
RBC: 3.11 MIL/uL — ABNORMAL LOW (ref 4.22–5.81)
RDW: 12.5 % (ref 11.5–15.5)
WBC: 4.6 10*3/uL (ref 4.0–10.5)
nRBC: 0 % (ref 0.0–0.2)

## 2019-10-10 LAB — BASIC METABOLIC PANEL
Anion gap: 8 (ref 5–15)
BUN: 13 mg/dL (ref 8–23)
CO2: 28 mmol/L (ref 22–32)
Calcium: 8.5 mg/dL — ABNORMAL LOW (ref 8.9–10.3)
Chloride: 102 mmol/L (ref 98–111)
Creatinine, Ser: 1.07 mg/dL (ref 0.61–1.24)
GFR calc Af Amer: >60 mL/min (ref >60)
GFR calc non Af Amer: >60 mL/min (ref >60)
Glucose, Bld: 103 mg/dL — ABNORMAL HIGH (ref 70–99)
Potassium: 3.9 mmol/L (ref 3.5–5.1)
Sodium: 138 mmol/L (ref 135–145)

## 2019-10-10 MED ORDER — CEPHALEXIN 500 MG PO CAPS: 500.0000 mg | ORAL_CAPSULE | Freq: Three times a day (TID) | ORAL | 0 refills | Status: AC

## 2019-10-10 MED ORDER — GABAPENTIN 300 MG PO CAPS: 300.0000 mg | ORAL_CAPSULE | Freq: Four times a day (QID) | ORAL | Status: DC

## 2019-10-10 MED ORDER — SODIUM CHLORIDE 0.9 % IV SOLN
1.0000 g | Freq: Once | INTRAVENOUS | Status: AC
  Administered 2019-10-10: 1 g via INTRAVENOUS
  Filled 2019-10-10: qty 1

## 2019-10-10 MED ORDER — CEPHALEXIN 500 MG PO CAPS: 500.0000 mg | ORAL_CAPSULE | Freq: Three times a day (TID) | ORAL | Status: DC

## 2019-10-10 MED ORDER — TRAMADOL HCL 50 MG PO TABS: 100.0000 mg | ORAL_TABLET | Freq: Four times a day (QID) | ORAL | 0 refills | Status: DC | PRN

## 2019-10-10 NOTE — Progress Notes (Signed)
TOC CM spoke to pt and explained [facility] cannot take DME. CM contacted [employer] to deliver DME to his home today. Needs RW and 3n1 bedside commode. Pt is non-weight bearing to right foot. [place], [place] ED TOC CM [phone]

## 2019-10-10 NOTE — Progress Notes (Signed)
[facility] called for transport home.  [facility] to deliver equipment to patient's home. Andre Lefort

## 2019-10-10 NOTE — Progress Notes (Signed)
TOC CM spoke to rep, Kasey confirmed [facility] will deliver DME to pt's home tonight. Unable to transport on ambulance due to safety concerns. [place], [place] ED TOC CM [phone]

## 2019-10-10 NOTE — Discharge Instructions (Signed)
Non weight bearing on Right leg per orthopedic MD

## 2019-10-10 NOTE — Discharge Summary (Signed)
Physician Discharge Summary  Dustin [name] UVO:[id number] DOB: August 23, 1940 DOA: 10/07/2019  PCP: Lujean Amel, MD  Admit date: 10/07/2019 Discharge date: 10/10/2019  Time spent: 55 minutes  Recommendations for Outpatient Follow-up:  1. Follow-up with Dr. Lucia Gaskins, orthopedics in 2 weeks. 2. Follow-up with Dr. Diona Fanti, urology in 1 week for follow-up on BPH, urinary retention.  Voiding trial was attempted during the hospitalization however patient failed and patient discharged back home with Foley catheter. 3. Follow-up with Koirala, Dibas, MD in 2 weeks.  On follow-up patient will need a basic metabolic profile done to follow-up on electrolytes and renal function.  Cellulitis will need to be reassessed at that time.   Discharge Diagnoses:  Principal Problem:   Sepsis due to cellulitis [facility]) Active Problems:   Chronic combined systolic (congestive) and diastolic (congestive) heart failure (HCC)   Cellulitis of right lower extremity   Acute metabolic encephalopathy   Unspecified fracture of right foot, initial encounter for closed fracture   Acute urinary retention   Idiopathic peripheral neuropathy   Discharge Condition: Stable and improved  Diet recommendation: Heart healthy  Filed Weights   10/08/19 1826 10/09/19 0520 10/10/19 0536  Weight: 91 kg 90.8 kg 90.8 kg    History of present illness:  HPI per Dr. [name] is a 79 y.o. male with history of combined CHF, recent right foot fracture, BPH, urinary retention with indwelling Foley, chronic pain, depression, ataxia and restless leg syndrome directed to ED from neurology office due to febrile, tachycardia, altered mental status and right foot swelling.  Patient had a fall at home last week. Reportedly fell walking in the kitchen. Reports hitting his head but denies LOC. No prodrome leading to fall. He presented to [facility] mid center found to have right foot fracture with acute urinary retention. He he was  discharged with walking boot and indwelling Foley catheter to follow-up with orthopedic surgery and urology.   Patient went to urology office this morning and found to be febrile, tachycardic and altered. He also had significant swelling and redness in right foot. He was directed to ED for evaluation.  Patient reports worsening redness and pain in his right foot over the course of last week. His pain was 10/10 with minimal weightbearing on right foot. His pain is currently 0/10 resting in bed. He also reports subjective fever. Denies chills, chest pain, dyspnea, nausea, vomiting, abdominal pain or diarrhea. He reports some tremoring in his arms.  Patient denies smoking cigarettes, drinking alcohol recreational drug use.  In ED, HR 119>> 79. RR 20>> 22. Mild temp to 100.2. WBC 9.5. Cr 1.29 (1.3618/12). Hgb 10.1 (12.1 on 8/12). Lactic acid 2.5 but improved to one-point 2:08 0.5 L IVF. UA with moderate Hgb. COVID-19 PCR negative. Right foot x-ray with first through fourth metatarsal and second through third proximal phalangeal fractures. Sepsis code activated. Patient received 2.5 L IVF. Cultures obtained. Started on IV ceftriaxone and hospitalist service was called for admission. EDP to call orthopedic surgery.   ROS All review of system negative except for pertinent positives and negatives as history of present illness above.  Hospital Course:  1 sepsis secondary to right foot cellulitis, POA Patient had presented with symptoms consistent with sepsis secondary to right foot cellulitis.  Patient met criteria for sepsis as patient had presented febrile, tachycardic, tachypneic, noted to have a left shift on CBC with differential, acute metabolic encephalopathy, lactic acidosis.  Physical exam done on admission concerning for right lower extremity cellulitis  with erythema, warmth, swelling.  Patient placed on sepsis protocol resuscitated with IV fluids and started on IV Rocephin.  Patient pancultured  with results with no growth to date by day of discharge.  Although orthopedics felt patient did not have a cellulitis about his right foot and feels secondary to trauma, patient had presented with fever, tachypnea, tachycardia, lactic acidosis, acute metabolic encephalopathy, left shift noted on CBC with clinical improvement after being started on sepsis protocol with IV antibiotics, IV fluids which were continued during the hospitalization.  Patient be discharged home on 4 more days of oral Keflex to complete a 7-day course of antibiotic treatment.  Outpatient follow-up with PCP and orthopedics.   2.  Acute toxic metabolic encephalopathy Likely multifactorial secondary to sepsis and polypharmacy.  Patient noted to be on multiple sedating medications.  Encephalopathy improved.  Patient placed on reduced home dose gabapentin, Suboxone.  Patient also maintained on home regimen Seroquel.    Patient maintained on IV fluids, IV antibiotics.  Patient improved clinically and was close to baseline by day of discharge.    3.  Fall at home/right foot fracture Plain films of the right foot revealing first through fourth metatarsal and second through third proximal phalangeal fractures.  Orthopedics consulted who recommended CT of the right foot which was done which showed fractures of the proximal first through fourth metatarsal, first and second toe commuted, possible acute fracture through the third distal metatarsal head, small chip fragment from the dorsal aspect of lateral cuneiform, possible nondisplaced fracture of the cuboid, second and third toe proximal phalanx fractures, no obvious bony destruction no suspicious periosteal reaction.  Patient seen in consultation by orthopedics who are recommending short leg nonweightbearing splint, to maintain nonweightbearing until soft tissues are too small to proceed with surgery and therefore plan to likely have this done in the outpatient setting.  Orthopedics  recommending patient float his heels and keep right lower extremity elevated on pillows.  Patient assessed by PT/OT.  Home health was recommended which was ordered.  Patient maintained on Ultram for breakthrough pain.  Patient was also maintained on home regimen of Suboxone.  Outpatient follow-up with orthopedics.   4.  Chronic combined systolic and diastolic CHF Remained euvolemic during the hospitalization.  Outpatient follow-up.   5.  BPH with acute urinary retention Patient with indwelling Foley catheter inserted 1 week prior to admission.  Voiding trial was attempted during this hospitalization however patient failed a Foley catheter placed back in on discharge.  Outpatient follow-up with his urologist Dr. Diona Fanti in 1 week for voiding trial and further evaluation.  Patient maintained on Flomax during the hospitalization and will be discharged back home on home regimen of Rapaflo.    6.  Chronic pain/peripheral neuropathy Patient maintained on home Suboxone and gabapentin at decreased dose.  Patient will be discharged home at half his home dose of gabapentin.  Outpatient follow-up.  7.  Depression Patient maintained on Seroquel.  Outpatient follow-up.  8.  Anemia of chronic disease Patient with no overt bleeding.  Anemia panel consistent with anemia of chronic disease.  Hemoglobin remained stable and was 9.2 by day of discharge.  Outpatient follow-up.     Procedures:  CT right foot 10/07/2019  Plain films of the right foot 10/07/2019  Chest x-ray 10/07/2019  Consultations:  Orthopedics: Dr. Lucia Gaskins 10/07/2019  Discharge Exam: Vitals:   10/09/19 2024 10/10/19 0553  BP: 132/68 117/61  Pulse: 66 79  Resp: 18 17  Temp: 97.6 F (36.4  C) (!) 97.4 F (36.3 C)  SpO2: 100% 94%    General: NAD Cardiovascular: RRR Respiratory: CTAB  Discharge Instructions   Discharge Instructions    Diet - low sodium heart healthy   Complete by: As directed    Increase activity  slowly   Complete by: As directed      Allergies as of 10/10/2019   No Known Allergies     Medication List    STOP taking these medications   methadone 10 MG tablet Commonly known as: DOLOPHINE   Oxcarbazepine 300 MG tablet Commonly known as: TRILEPTAL     TAKE these medications   Buprenorphine HCl-Naloxone HCl 8-2 MG Film Place 0.5 strips under the tongue 4 (four) times daily.   cephALEXin 500 MG capsule Commonly known as: KEFLEX Take 1 capsule (500 mg total) by mouth every 8 (eight) hours for 4 days. Start taking on: October 11, 2019   gabapentin 300 MG capsule Commonly known as: NEURONTIN Take 1 capsule (300 mg total) by mouth 4 (four) times daily. What changed: how much to take   Melatonin 10 MG Tabs Take 10 mg by mouth at bedtime as needed.   multivitamin with minerals tablet Take 1 tablet by mouth daily.   pantoprazole 40 MG tablet Commonly known as: PROTONIX Take 40 mg by mouth daily.   QUEtiapine 400 MG tablet Commonly known as: SEROQUEL Take 400 mg by mouth at bedtime.   rOPINIRole 0.25 MG tablet Commonly known as: REQUIP Take 0.25 mg by mouth every evening. What changed: Another medication with the same name was removed. Continue taking this medication, and follow the directions you see here.   silodosin 8 MG Caps capsule Commonly known as: RAPAFLO Take 8 mg by mouth daily.   Sodium Fluoride 5000 PPM 1.1 % Pste Generic drug: Sodium Fluoride Take 1 application by mouth 2 (two) times daily. Do not eat or drink for 30 minutes after   Tandem 162-115.2 MG Caps capsule Generic drug: ferrous fumarate-iron polysaccharide complex TAKE 1 CAPSULE BY MOUTH DAILY WITH BREAKFAST. What changed: See the new instructions.   traMADol 50 MG tablet Commonly known as: ULTRAM Take 2 tablets (100 mg total) by mouth every 6 (six) hours as needed for moderate pain.            Durable Medical Equipment  (From admission, onward)         Start     Ordered    10/08/19 1715  For home use only DME 3 n 1  Once        10/08/19 1724   10/08/19 1715  For home use only DME Walker rolling  Once       Question Answer Comment  Walker: With 5 Inch Wheels   Patient needs a walker to treat with the following condition Closed right ankle fracture      10/08/19 1724         No Known Allergies  Follow-up Information    Erle Crocker, MD. Schedule an appointment as soon as possible for a visit in 2 weeks.   Specialty: Orthopedic Surgery Contact information: [place] [address] [phone]        Lujean Amel, MD. Schedule an appointment as soon as possible for a visit in 2 week(s).   Specialty: Family Medicine Contact information: [place] [address] [phone]        Franchot Gallo, MD. Schedule an appointment as soon as possible for  a visit in 1 week(s).   Specialty: Urology Contact information: [place] [address] [phone]                The results of significant diagnostics from this hospitalization (including imaging, microbiology, ancillary and laboratory) are listed below for reference.    Significant Diagnostic Studies: DG Chest 2 View  Result Date: 10/07/2019 CLINICAL DATA:  Questionable sepsis. Tremor and fever with intermittent confusion EXAM: CHEST - 2 VIEW COMPARISON:  September 30, 2019 FINDINGS: Trachea is midline. Cardiomediastinal contours and hilar structures are normal. Lungs are clear. Subtle increased interstitial markings are noted at the LEFT lung base, likely chronic. No lobar consolidation. No pleural effusion. Skeletal structures on limited assessment are unremarkable. IMPRESSION: Subtle increased interstitial markings at the LEFT lung base may represent scarring, no consolidation or signs of pleural effusion. Electronically Signed   By: Zetta Bills M.D.   On: 10/07/2019 11:18   DG Ankle Complete Right  Result Date:  09/30/2019 CLINICAL DATA:  Fall, pain, bruising EXAM: RIGHT ANKLE - COMPLETE 3+ VIEW COMPARISON:  Foot series today FINDINGS: Plantar calcaneal spur. No fracture, subluxation or dislocation at the right ankle. Previously seen foot fractures not visualized on this ankle series. IMPRESSION: No acute bony abnormality in the right ankle. Electronically Signed   By: Rolm Baptise M.D.   On: 09/30/2019 15:25   CT FOOT RIGHT WO CONTRAST  Result Date: 10/07/2019 CLINICAL DATA:  Foot trauma. Lisfranc suspected. Tarsal metatarsal joint fracture dislocation. EXAM: CT OF THE RIGHT FOOT WITHOUT CONTRAST TECHNIQUE: Multidetector CT imaging of the right foot was performed according to the standard protocol. Multiplanar CT image reconstructions were also generated. COMPARISON:  Radiograph earlier this day.  Radiograph 09/30/2019 FINDINGS: Bones/Joint/Cartilage Comminuted and displaced fracture of the proximal first metatarsal extending into medial cuneiform joint. Displaced proximal second metatarsal fracture also comminuted extending to the tarsal metatarsal joint. Transverse mildly displaced and comminuted fracture involving the base of the third metatarsal without intra-articular extension. Nondisplaced fracture base of the fourth metatarsal. There is a suspected nondisplaced fracture of the cuboid. Small chip fragment from the dorsal aspect of the lateral cuneiform. Fracture of the second toe proximal phalanx extends to the tarsal metatarsal joint. Nondisplaced fracture of the third toe proximal phalanx. Possible acute fracture through the distal third metatarsal head. Suspected remote fracture of the distal fourth metatarsal. No hindfoot fracture. The degree of osteopenia/osteoporosis limits detailed assessment. There is no obvious bony destruction or suspicious periosteal reaction. Ligaments There is no complete Lisfranc ligament disruption. Portions of the ligament are visualized. There are fractures of the adjacent  proximal second metatarsal. Muscles and Tendons Fatty atrophy of the intrinsic musculature of the foot. No complete flexor, extensor, or perennial tendon disruption. Soft tissues Generalized subcutaneous soft tissue edema. There is no soft tissue air. No focal hematoma or abscess. IMPRESSION: 1. Fractures of the proximal first through fourth metatarsals, first and second are comminuted. Second metatarsal fracture is in the region of the Lisfranc ligament insertion, however no complete ligament is disruption. 2. Possible acute fracture through the distal third metatarsal head. 3. Small chip fragment from the dorsal aspect of the lateral cuneiform. Possible nondisplaced fracture of the cuboid. 4. Second and third toe proximal phalanx fractures. 5. No obvious bony destruction or suspicious periosteal reaction. The degree of osteopenia/osteoporosis limits detailed assessment. Electronically Signed   By: Keith Rake M.D.   On: 10/07/2019 19:37   DG Chest Port 1 View  Result Date:  09/30/2019 CLINICAL DATA:  Tremors, fall, fever. EXAM: PORTABLE CHEST 1 VIEW COMPARISON:  Chest x-rays dated 12/30/2017 and 02/06/2016. FINDINGS: Heart size and mediastinal contours are within normal limits. Lungs are hyperexpanded. Lungs are clear. No pleural effusion or pneumothorax is seen. IMPRESSION: 1. No acute findings. No evidence of pneumonia or pulmonary edema. 2. Hyperexpanded lungs indicating COPD. Electronically Signed   By: Franki Cabot M.D.   On: 09/30/2019 13:25   DG Foot 2 Views Right  Result Date: 10/07/2019 CLINICAL DATA:  Right foot cellulitis, swelling, ecchymosis, recent fracture EXAM: RIGHT FOOT - 2 VIEW COMPARISON:  09/30/2019 FINDINGS: Frontal and lateral views of the right foot are obtained. Comminuted intra-articular fracture at the base of the second proximal phalanx, oblique fracture through the proximal aspect of the third proximal phalanx, and comminuted intra-articular fractures through the bases of  the first through fourth metatarsals unchanged. There is diffuse soft tissue edema. No radiographic evidence of osteomyelitis. IMPRESSION: 1. Stable fractures of the second and third proximal phalanges and first through fourth metatarsal bases. 2. Diffuse soft tissue edema. Electronically Signed   By: Randa Ngo M.D.   On: 10/07/2019 15:10   DG Foot Complete Right  Result Date: 09/30/2019 CLINICAL DATA:  Fall, swelling, pain second through 4th toes and metatarsals. EXAM: RIGHT FOOT COMPLETE - 3+ VIEW COMPARISON:  None. FINDINGS: Fractures are noted at the base of the 1st through 4th metatarsals. Minimal displacement. Fractures are also noted through the bases of the 2nd and 3rd proximal phalanges. No subluxation or dislocation. Soft tissues are intact. IMPRESSION: Fracture through the bases of the 1st through 4th metatarsals. Fracture through the bases of the 2nd and 3rd proximal phalanges. Electronically Signed   By: Rolm Baptise M.D.   On: 09/30/2019 15:24    Microbiology: Recent Results (from the past 240 hour(s))  Culture, blood (Routine x 2)     Status: None   Collection Time: 09/30/19 12:53 PM   Specimen: BLOOD  Result Value Ref Range Status   Specimen Description   Final    BLOOD LEFT ANTECUBITAL Performed at [facility], [address] [place], [place] [address]    Special Requests   Final    BOTTLES DRAWN AEROBIC AND ANAEROBIC Blood Culture adequate volume Performed at [facility], [address] [place], [place] [address]    Culture   Final    NO GROWTH 5 DAYS Performed at [facility], [place] [address], [place], [place] [address]    Report Status 10/05/2019 FINAL  Final  Culture, blood (Routine x 2)     Status: None   Collection Time: 09/30/19  1:24 PM   Specimen: BLOOD RIGHT FOREARM  Result Value Ref Range Status   Specimen Description   Final    BLOOD RIGHT FOREARM Performed at [facility], [address] [place], [place]  [address]    Special Requests   Final    BOTTLES DRAWN AEROBIC AND ANAEROBIC Blood Culture results may not be optimal due to an inadequate volume of blood received in culture bottles Performed at [facility], [address] [place], [place] [address]    Culture   Final    NO GROWTH 5 DAYS Performed at [facility], [place] [address] [place], [place] [address]    Report Status 10/05/2019 FINAL  Final  SARS Coronavirus 2 by RT PCR (hospital order, performed in [facility] hospital lab)  Nasopharyngeal Nasopharyngeal Swab     Status: None   Collection Time: 09/30/19  2:20 PM   Specimen: Nasopharyngeal Swab  Result Value Ref Range Status   SARS Coronavirus 2 NEGATIVE NEGATIVE Final    Comment: (NOTE) SARS-CoV-2 target nucleic acids are NOT DETECTED.  The SARS-CoV-2 RNA is generally detectable in upper and lower respiratory specimens during the acute phase of infection. The lowest concentration of SARS-CoV-2 viral copies this assay can detect is 250 copies / mL. A negative result does not preclude SARS-CoV-2 infection and should not be used as the sole basis for treatment or other patient management decisions.  A negative result may occur with improper specimen collection / handling, submission of specimen other than nasopharyngeal swab, presence of viral mutation(s) within the areas targeted by this assay, and inadequate number of viral copies (<250 copies / mL). A negative result must be combined with clinical observations, patient history, and epidemiological information.  Fact Sheet for Patients:   StrictlyIdeas.no  Fact Sheet for Healthcare Providers: BankingDealers.co.za  This test is not yet approved or  cleared by the [place] FDA and has been authorized for detection and/or diagnosis of SARS-CoV-2 by FDA under an Emergency Use Authorization (EUA).  This EUA will remain in effect (meaning this test can be used)  for the duration of the COVID-19 declaration under Section 564(b)(1) of the Act, 21 U.S.C. section 360bbb-3(b)(1), unless the authorization is terminated or revoked sooner.  Performed at [facility], [address] [place], [place] [address]   Urine culture     Status: None   Collection Time: 09/30/19  4:03 PM   Specimen: Urine, Random  Result Value Ref Range Status   Specimen Description   Final    URINE, RANDOM Performed at [facility], [address] [place], [place] [address]    Special Requests   Final    NONE Performed at [facility], [address] [place], [place] [address]    Culture   Final    NO GROWTH Performed at [facility], [place] [address] [place], [place] [address]    Report Status 10/01/2019 FINAL  Final  Blood Culture (routine x 2)     Status: None (Preliminary result)   Collection Time: 10/07/19  9:52 AM   Specimen: BLOOD  Result Value Ref Range Status   Specimen Description   Final    BLOOD RIGHT ANTECUBITAL Performed at [place] [address], [place], [place] [address]    Special Requests   Final    BOTTLES DRAWN AEROBIC AND ANAEROBIC Blood Culture adequate volume Performed at [place] [address], [place], [place] [address]    Culture   Final    NO GROWTH 3 DAYS Performed at [facility], [place] [address] [place], [place] [address]    Report Status PENDING  Incomplete  SARS Coronavirus 2 by RT PCR (hospital order, performed in [facility] hospital lab) Nasopharyngeal Nasopharyngeal Swab     Status: None   Collection Time: 10/07/19 11:40 AM   Specimen: Nasopharyngeal Swab  Result Value Ref Range Status   SARS Coronavirus 2 NEGATIVE NEGATIVE Final    Comment: (NOTE) SARS-CoV-2 target nucleic acids are NOT DETECTED.  The SARS-CoV-2 RNA is generally detectable in upper and lower respiratory specimens during the acute phase of infection. The  lowest concentration of SARS-CoV-2 viral copies this assay can detect is 250 copies /  mL. A negative result does not preclude SARS-CoV-2 infection and should not be used as the sole basis for treatment or other patient management decisions.  A negative result may occur with improper specimen collection / handling, submission of specimen other than nasopharyngeal swab, presence of viral mutation(s) within the areas targeted by this assay, and inadequate number of viral copies (<250 copies / mL). A negative result must be combined with clinical observations, patient history, and epidemiological information.  Fact Sheet for Patients:   StrictlyIdeas.no  Fact Sheet for Healthcare Providers: BankingDealers.co.za  This test is not yet approved or  cleared by the [place] FDA and has been authorized for detection and/or diagnosis of SARS-CoV-2 by FDA under an Emergency Use Authorization (EUA).  This EUA will remain in effect (meaning this test can be used) for the duration of the COVID-19 declaration under Section 564(b)(1) of the Act, 21 U.S.C. section 360bbb-3(b)(1), unless the authorization is terminated or revoked sooner.  Performed at [facility], [place] [address], [place], [place] [address]   Urine culture     Status: None   Collection Time: 10/07/19 12:56 PM   Specimen: In/Out Cath Urine  Result Value Ref Range Status   Specimen Description   Final    IN/OUT CATH URINE Performed at [place] [address] [place], [place] [address]    Special Requests   Final    NONE Performed at [facility], [place] [address], [place], [place] [address]    Culture   Final    NO GROWTH Performed at [facility], [place] [address] [place],  [address]    Report Status 10/08/2019 FINAL  Final     Labs: Basic Metabolic Panel: Recent Labs  Lab 10/07/19 0952  10/08/19 0818 10/09/19 0606 10/10/19 0559  NA 142 139 139 138  K 4.0 3.9 3.8 3.9  CL 106 102 102 102  CO2 _0 GLUCOSE 135* 109* 104* 103*  BUN _1 CREATININE 1.29* 1.10 1.01 1.07  CALCIUM 8.2* 8.7* 8.7* 8.5*   Liver Function Tests: Recent Labs  Lab 10/07/19 0952  AST 21  ALT 12  ALKPHOS 57  BILITOT 0.6  PROT 5.9*  ALBUMIN 3.4*   No results for input(s): LIPASE, AMYLASE in the last 168 hours. No results for input(s): AMMONIA in the last 168 hours. CBC: Recent Labs  Lab 10/07/19 0952 10/08/19 0818 10/09/19 0606 10/10/19 0559  WBC 9.5 5.4 5.0 4.6  NEUTROABS 8.3* 3.4 2.5 2.0  HGB 10.4* 9.9* 9.2* 9.2*  HCT 32.3* 30.1* 27.8* 27.9*  MCV 91.2 91.2 90.6 89.7  PLT 247 177 182 181   Cardiac Enzymes: No results for input(s): CKTOTAL, CKMB, CKMBINDEX, TROPONINI in the last 168 hours. BNP: BNP (last 3 results) No results for input(s): BNP in the last 8760 hours.  ProBNP (last 3 results) No results for input(s): PROBNP in the last 8760 hours.  CBG: No results for input(s): GLUCAP in the last 168 hours.     Signed:  Irine Seal MD.  [facility] 10/10/2019, 11:04 AM

## 2019-10-10 NOTE — TOC Progression Note (Signed)
Transition of Care [facility]) - Progression Note    Patient Details  Name: Dustin [name] MRN: [id number] Date of Birth: November 04, 1940  Transition of Care [facility]) CM/SW Contact  Joaquin Courts, RN Phone Number: 10/10/2019, 11:24 AM  Clinical Narrative:    Patient set up with Alvis Lemmings for HHPT/OT/Aide. [employer] to deliver rolling walker and 3in1 to bedside for home use.    Expected Discharge Plan: [place] Barriers to Discharge: No Barriers Identified  Expected Discharge Plan and Services Expected Discharge Plan: [place] In-house Referral: Clinical Social Work Discharge Planning Services: CM Consult Post Acute Care Choice: [place] arrangements for the past 2 months: Single Family Home Expected Discharge Date: 10/10/19               DME Arranged: Gilford Rile rolling, 3-N-1 DME Agency: [employer] Date DME Agency Contacted: 10/10/19 Time DME Agency Contacted: [phone] Representative spoke with at DME Agency: on-call referral number HH Arranged: PT, OT, Nurse's Aide [place] Agency: [place] Date [place]: 10/10/19 Time [place]: [id number] Representative spoke with at [place]: [place] ([place]) Interventions    Readmission Risk Interventions No flowsheet data found.

## 2019-10-10 NOTE — Progress Notes (Signed)
Patient had the urge to void but was unable to. Bladder scan done at approximately 0334 for over 516 cc. I&O cath using a coude catheter, done at approximately 0350 for 700 cc of clear yellow urine. Bladder scan post cath was 0 cc.

## 2019-10-12 LAB — CULTURE, BLOOD (ROUTINE X 2)
Culture: NO GROWTH
Special Requests: ADEQUATE

## 2020-03-07 ENCOUNTER — Encounter (HOSPITAL_COMMUNITY): Payer: Self-pay

## 2020-03-07 ENCOUNTER — Other Ambulatory Visit: Payer: Self-pay

## 2020-03-07 ENCOUNTER — Inpatient Hospital Stay (HOSPITAL_COMMUNITY)
Admission: EM | Admit: 2020-03-07 | Discharge: 2020-03-17 | DRG: 481 | Disposition: A | Discharge status: Skilled Nursing Facility | Payer: Medicare Other | Attending: Family Medicine | Admitting: Family Medicine

## 2020-03-07 ENCOUNTER — Emergency Department (HOSPITAL_COMMUNITY): Payer: Medicare Other

## 2020-03-07 DIAGNOSIS — R338 Other retention of urine: Secondary | ICD-10-CM | POA: Diagnosis present

## 2020-03-07 DIAGNOSIS — D62 Acute posthemorrhagic anemia: Secondary | ICD-10-CM | POA: Diagnosis not present

## 2020-03-07 DIAGNOSIS — D649 Anemia, unspecified: Secondary | ICD-10-CM | POA: Diagnosis not present

## 2020-03-07 DIAGNOSIS — Z66 Do not resuscitate: Secondary | ICD-10-CM | POA: Diagnosis present

## 2020-03-07 DIAGNOSIS — R2681 Unsteadiness on feet: Secondary | ICD-10-CM | POA: Diagnosis present

## 2020-03-07 DIAGNOSIS — W19XXXA Unspecified fall, initial encounter: Secondary | ICD-10-CM

## 2020-03-07 DIAGNOSIS — Z20822 Contact with and (suspected) exposure to covid-19: Secondary | ICD-10-CM | POA: Diagnosis present

## 2020-03-07 DIAGNOSIS — R339 Retention of urine, unspecified: Secondary | ICD-10-CM | POA: Diagnosis present

## 2020-03-07 DIAGNOSIS — S7290XA Unspecified fracture of unspecified femur, initial encounter for closed fracture: Secondary | ICD-10-CM | POA: Diagnosis present

## 2020-03-07 DIAGNOSIS — Z79899 Other long term (current) drug therapy: Secondary | ICD-10-CM

## 2020-03-07 DIAGNOSIS — K567 Ileus, unspecified: Secondary | ICD-10-CM | POA: Diagnosis not present

## 2020-03-07 DIAGNOSIS — Y92009 Unspecified place in unspecified non-institutional (private) residence as the place of occurrence of the external cause: Secondary | ICD-10-CM

## 2020-03-07 DIAGNOSIS — I5042 Chronic combined systolic (congestive) and diastolic (congestive) heart failure: Secondary | ICD-10-CM | POA: Diagnosis present

## 2020-03-07 DIAGNOSIS — E861 Hypovolemia: Secondary | ICD-10-CM | POA: Diagnosis not present

## 2020-03-07 DIAGNOSIS — S72331A Displaced oblique fracture of shaft of right femur, initial encounter for closed fracture: Principal | ICD-10-CM | POA: Diagnosis present

## 2020-03-07 DIAGNOSIS — G609 Hereditary and idiopathic neuropathy, unspecified: Secondary | ICD-10-CM | POA: Diagnosis present

## 2020-03-07 DIAGNOSIS — G6289 Other specified polyneuropathies: Secondary | ICD-10-CM | POA: Diagnosis not present

## 2020-03-07 DIAGNOSIS — G4700 Insomnia, unspecified: Secondary | ICD-10-CM | POA: Diagnosis present

## 2020-03-07 DIAGNOSIS — S72009A Fracture of unspecified part of neck of unspecified femur, initial encounter for closed fracture: Secondary | ICD-10-CM | POA: Diagnosis not present

## 2020-03-07 DIAGNOSIS — G8929 Other chronic pain: Secondary | ICD-10-CM | POA: Diagnosis present

## 2020-03-07 DIAGNOSIS — Z981 Arthrodesis status: Secondary | ICD-10-CM

## 2020-03-07 DIAGNOSIS — N179 Acute kidney failure, unspecified: Secondary | ICD-10-CM | POA: Diagnosis not present

## 2020-03-07 DIAGNOSIS — Z79891 Long term (current) use of opiate analgesic: Secondary | ICD-10-CM | POA: Diagnosis not present

## 2020-03-07 DIAGNOSIS — K219 Gastro-esophageal reflux disease without esophagitis: Secondary | ICD-10-CM | POA: Diagnosis present

## 2020-03-07 DIAGNOSIS — S72301A Unspecified fracture of shaft of right femur, initial encounter for closed fracture: Secondary | ICD-10-CM | POA: Diagnosis not present

## 2020-03-07 DIAGNOSIS — I9589 Other hypotension: Secondary | ICD-10-CM | POA: Diagnosis not present

## 2020-03-07 DIAGNOSIS — R5381 Other malaise: Secondary | ICD-10-CM | POA: Diagnosis present

## 2020-03-07 DIAGNOSIS — I951 Orthostatic hypotension: Secondary | ICD-10-CM | POA: Diagnosis not present

## 2020-03-07 DIAGNOSIS — N400 Enlarged prostate without lower urinary tract symptoms: Secondary | ICD-10-CM | POA: Diagnosis present

## 2020-03-07 DIAGNOSIS — E871 Hypo-osmolality and hyponatremia: Secondary | ICD-10-CM | POA: Diagnosis present

## 2020-03-07 DIAGNOSIS — K5900 Constipation, unspecified: Secondary | ICD-10-CM

## 2020-03-07 DIAGNOSIS — Z87891 Personal history of nicotine dependence: Secondary | ICD-10-CM | POA: Diagnosis not present

## 2020-03-07 DIAGNOSIS — N401 Enlarged prostate with lower urinary tract symptoms: Secondary | ICD-10-CM | POA: Diagnosis present

## 2020-03-07 DIAGNOSIS — W000XXA Fall on same level due to ice and snow, initial encounter: Secondary | ICD-10-CM | POA: Diagnosis present

## 2020-03-07 DIAGNOSIS — Z419 Encounter for procedure for purposes other than remedying health state, unspecified: Secondary | ICD-10-CM

## 2020-03-07 DIAGNOSIS — S72301D Unspecified fracture of shaft of right femur, subsequent encounter for closed fracture with routine healing: Secondary | ICD-10-CM | POA: Diagnosis not present

## 2020-03-07 LAB — TYPE AND SCREEN
ABO/RH(D): A POS
Antibody Screen: NEGATIVE

## 2020-03-07 LAB — CBC WITH DIFFERENTIAL/PLATELET
Abs Immature Granulocytes: 0.07 10*3/uL (ref 0.00–0.07)
Basophils Absolute: 0 10*3/uL (ref 0.0–0.1)
Basophils Relative: 0 %
Eosinophils Absolute: 0.1 10*3/uL (ref 0.0–0.5)
Eosinophils Relative: 2 %
HCT: 34.1 % — ABNORMAL LOW (ref 39.0–52.0)
Hemoglobin: 11.2 g/dL — ABNORMAL LOW (ref 13.0–17.0)
Immature Granulocytes: 1 %
Lymphocytes Relative: 25 %
Lymphs Abs: 2 10*3/uL (ref 0.7–4.0)
MCH: 28.9 pg (ref 26.0–34.0)
MCHC: 32.8 g/dL (ref 30.0–36.0)
MCV: 88.1 fL (ref 80.0–100.0)
Monocytes Absolute: 0.5 10*3/uL (ref 0.1–1.0)
Monocytes Relative: 6 %
Neutro Abs: 5.2 10*3/uL (ref 1.7–7.7)
Neutrophils Relative %: 66 %
Platelets: 172 10*3/uL (ref 150–400)
RBC: 3.87 MIL/uL — ABNORMAL LOW (ref 4.22–5.81)
RDW: 13.1 % (ref 11.5–15.5)
WBC: 7.9 10*3/uL (ref 4.0–10.5)
nRBC: 0 % (ref 0.0–0.2)

## 2020-03-07 LAB — COMPREHENSIVE METABOLIC PANEL
ALT: 13 U/L (ref 0–44)
AST: 24 U/L (ref 15–41)
Albumin: 3.9 g/dL (ref 3.5–5.0)
Alkaline Phosphatase: 74 U/L (ref 38–126)
Anion gap: 11 (ref 5–15)
BUN: 14 mg/dL (ref 8–23)
CO2: 25 mmol/L (ref 22–32)
Calcium: 8.9 mg/dL (ref 8.9–10.3)
Chloride: 99 mmol/L (ref 98–111)
Creatinine, Ser: 1.28 mg/dL — ABNORMAL HIGH (ref 0.61–1.24)
GFR, Estimated: 57 mL/min — ABNORMAL LOW (ref >60)
Glucose, Bld: 121 mg/dL — ABNORMAL HIGH (ref 70–99)
Potassium: 4.2 mmol/L (ref 3.5–5.1)
Sodium: 135 mmol/L (ref 135–145)
Total Bilirubin: 0.4 mg/dL (ref 0.3–1.2)
Total Protein: 6.6 g/dL (ref 6.5–8.1)

## 2020-03-07 LAB — PROTIME-INR
INR: 1.1 (ref 0.8–1.2)
Prothrombin Time: 13.4 seconds (ref 11.4–15.2)

## 2020-03-07 MED ORDER — METHOCARBAMOL 1000 MG/10ML IJ SOLN: 500.0000 mg | Freq: Once | INTRAMUSCULAR | Status: DC

## 2020-03-07 MED ORDER — HYDROMORPHONE HCL 1 MG/ML IJ SOLN
1.0000 mg | Freq: Once | INTRAMUSCULAR | Status: AC
  Administered 2020-03-07: 1 mg via INTRAVENOUS
  Filled 2020-03-07: qty 1

## 2020-03-07 MED ORDER — HYDROMORPHONE HCL 1 MG/ML IJ SOLN
0.5000 mg | Freq: Once | INTRAMUSCULAR | Status: AC
  Administered 2020-03-07: 0.5 mg via INTRAVENOUS
  Filled 2020-03-07: qty 1

## 2020-03-07 MED ORDER — GABAPENTIN 300 MG PO CAPS
300.0000 mg | ORAL_CAPSULE | Freq: Four times a day (QID) | ORAL | Status: DC
  Administered 2020-03-07 – 2020-03-16 (×35): 300 mg via ORAL
  Filled 2020-03-07 (×34): qty 1

## 2020-03-07 MED ORDER — METHOCARBAMOL 1000 MG/10ML IJ SOLN
500.0000 mg | Freq: Once | INTRAVENOUS | Status: AC
  Administered 2020-03-07: 500 mg via INTRAVENOUS
  Filled 2020-03-07: qty 5

## 2020-03-07 MED ORDER — MORPHINE SULFATE (PF) 2 MG/ML IV SOLN
0.5000 mg | INTRAVENOUS | Status: DC | PRN
  Administered 2020-03-07 – 2020-03-08 (×4): 0.5 mg via INTRAVENOUS
  Filled 2020-03-07 (×4): qty 1

## 2020-03-07 MED ORDER — HYDROMORPHONE HCL 1 MG/ML IJ SOLN
INTRAMUSCULAR | Status: AC
  Administered 2020-03-07: 1 mg via INTRAVENOUS
  Filled 2020-03-07: qty 1

## 2020-03-07 MED ORDER — HYDROMORPHONE HCL 1 MG/ML IJ SOLN: 1.0000 mg | Freq: Once | INTRAMUSCULAR | Status: AC

## 2020-03-07 NOTE — H&P (Addendum)
History and Physical    TELVIS GORSUCH [id number] DOB: 15-Jul-1940 DOA: 03/07/2020  PCP: Lujean Amel, MD  Patient coming from: Home.  Chief Complaint: Fall.  HPI: KERN AHO is a 80 y.o. male with history of chronic anemia, neuropathy, chronic combined systolic and diastolic CHF last EF measured was 40 to 45% with grade 1 diastolic dysfunction in 0000000 had a fall at home after slipping on ice.  Did not lose consciousness.  Has significant pain in the right thigh area.  ED Course: X-rays reveal acute comminuted fracture of distal femur.  Labs are significant for anemia.  On-call orthopedic surgeon was consulted and admitted for further management of the right thigh distal femur fracture.  COVID test is negative.  Review of Systems: As per HPI, rest all negative.   Past Medical History:  Diagnosis Date  . BPH (benign prostatic hyperplasia)   . Chronic combined systolic and diastolic congestive heart failure ([place])   . GERD (gastroesophageal reflux disease)    protonix for control  . Insomnia   . Neck pain   . Neuropathy     Past Surgical History:  Procedure Laterality Date  . ANTERIOR CERVICAL DECOMPRESSION/DISCECTOMY FUSION 4 LEVELS N/A 10/27/2012   Procedure: Cervical Three-Four Cervical Four-Five Cervical Five-Six Cervical Six-Seven  Anterior cervical decompression/diskectomy/fusion;  Surgeon: Erline Levine, MD;  Location: [place] NEURO ORS;  Service: Neurosurgery;  Laterality: N/A;  Cervical Three-Four Cervical Four-Five Cervical Five-Six Cervical Six-Seven  Anterior cervical decompression/diskectomy/fusion  . SHOULDER ARTHROSCOPY  2010   rt shoulder  . SHOULDER ARTHROSCOPY  02/15/2011   Procedure: ARTHROSCOPY SHOULDER;  Surgeon: Cynda Familia;  Location: [place];  Service: Orthopedics;  Laterality: Right;  Debridement  . TONSILLECTOMY       reports that he quit smoking about 30 years ago. He smoked 1.50 packs per day. He has never used  smokeless tobacco. He reports that he does not drink alcohol and does not use drugs.  No Known Allergies  Family History  Problem Relation Age of Onset  . Emphysema Mother   . Cirrhosis Father   . Stroke Brother     Prior to Admission medications   Medication Sig Start Date End Date Taking? Authorizing Provider  Buprenorphine HCl-Naloxone HCl 8-2 MG FILM Place 0.5 strips under the tongue 4 (four) times daily. 09/23/19   [provider]  gabapentin (NEURONTIN) 300 MG capsule Take 1 capsule (300 mg total) by mouth 4 (four) times daily. 10/10/19   Eugenie Filler, MD  Melatonin 10 MG TABS Take 10 mg by mouth at bedtime as needed.    [provider]  Multiple Vitamins-Minerals (MULTIVITAMIN WITH MINERALS) tablet Take 1 tablet by mouth daily.    [provider]  pantoprazole (PROTONIX) 40 MG tablet Take 40 mg by mouth daily.      [provider]  QUEtiapine (SEROQUEL) 400 MG tablet Take 400 mg by mouth at bedtime. 08/31/19   [provider]  rOPINIRole (REQUIP) 0.25 MG tablet Take 0.25 mg by mouth every evening. 07/23/19   [provider]  silodosin (RAPAFLO) 8 MG CAPS capsule Take 8 mg by mouth daily. 08/07/19   [provider]  SODIUM FLUORIDE 5000 PPM 1.1 % PSTE Take 1 application by mouth 2 (two) times daily. Do not eat or drink for 30 minutes after 09/09/19   [provider]  TANDEM 162-115.2 MG CAPS capsule TAKE 1 CAPSULE BY MOUTH DAILY WITH BREAKFAST. Patient taking differently: Take 1 capsule  by mouth daily with breakfast.  07/06/18   Marcial Pacas, MD  traMADol (ULTRAM) 50 MG tablet Take 2 tablets (100 mg total) by mouth every 6 (six) hours as needed for moderate pain. 10/10/19   Eugenie Filler, MD    Physical Exam: Constitutional: Moderately built and nourished. Vitals:   03/07/20 1830 03/07/20 1845 03/07/20 1930 03/07/20 2000  BP: 109/85 119/76 138/72 136/72  Pulse: 81 85 78 84  Resp: 17 12 18 15   SpO2: 98% 100%  100% 100%  Weight:      Height:       Eyes: Anicteric no pallor. ENMT: No discharge from the ears eyes nose and mouth. Neck: No mass felt.  No neck rigidity. Respiratory: No rhonchi or crepitations. Cardiovascular: S1-S2 heard. Abdomen: Soft nontender bowel sounds present. Musculoskeletal: Pain on moving right lower extremity. Skin: No rash. Neurologic: Alert awake oriented to time place and person.  Moves all extremities. Psychiatric: Appears normal.  Normal affect.   Labs on Admission: I have personally reviewed following labs and imaging studies  CBC: Recent Labs  Lab 03/07/20 1643  WBC 7.9  NEUTROABS 5.2  HGB 11.2*  HCT 34.1*  MCV 88.1  PLT [id number]   Basic Metabolic Panel: Recent Labs  Lab 03/07/20 1643  NA 135  K 4.2  CL 99  CO2 25  GLUCOSE 121*  BUN 14  CREATININE 1.28*  CALCIUM 8.9   GFR: Estimated Creatinine Clearance: 55.3 mL/min (A) (by C-G formula based on SCr of 1.28 mg/dL (H)). Liver Function Tests: Recent Labs  Lab 03/07/20 1643  AST 24  ALT 13  ALKPHOS 74  BILITOT 0.4  PROT 6.6  ALBUMIN 3.9   No results for input(s): LIPASE, AMYLASE in the last 168 hours. No results for input(s): AMMONIA in the last 168 hours. Coagulation Profile: Recent Labs  Lab 03/07/20 1643  INR 1.1   Cardiac Enzymes: No results for input(s): CKTOTAL, CKMB, CKMBINDEX, TROPONINI in the last 168 hours. BNP (last 3 results) No results for input(s): PROBNP in the last 8760 hours. HbA1C: No results for input(s): HGBA1C in the last 72 hours. CBG: No results for input(s): GLUCAP in the last 168 hours. Lipid Profile: No results for input(s): CHOL, HDL, LDLCALC, TRIG, CHOLHDL, LDLDIRECT in the last 72 hours. Thyroid Function Tests: No results for input(s): TSH, T4TOTAL, FREET4, T3FREE, THYROIDAB in the last 72 hours. Anemia Panel: No results for input(s): VITAMINB12, FOLATE, FERRITIN, TIBC, IRON, RETICCTPCT in the last 72 hours. Urine analysis:    Component Value  Date/Time   COLORURINE YELLOW 10/07/2019 [place] 10/07/2019 1256   LABSPEC 1.014 10/07/2019 1256   PHURINE 7.0 10/07/2019 1256   GLUCOSEU NEGATIVE 10/07/2019 1256   HGBUR MODERATE (A) 10/07/2019 1256   BILIRUBINUR NEGATIVE 10/07/2019 1256   KETONESUR NEGATIVE 10/07/2019 1256   PROTEINUR NEGATIVE 10/07/2019 1256   NITRITE NEGATIVE 10/07/2019 1256   LEUKOCYTESUR NEGATIVE 10/07/2019 1256   Sepsis Labs: @LABRCNTIP (procalcitonin:4,lacticidven:4) )No results found for this or any previous visit (from the past 240 hour(s)).   Radiological Exams on Admission: CT Head Wo Contrast  Result Date: 03/07/2020 CLINICAL DATA:  Head trauma fell on ice EXAM: CT HEAD WITHOUT CONTRAST TECHNIQUE: Contiguous axial images were obtained from the base of the skull through the vertex without intravenous contrast. COMPARISON:  CT brain 02/09/2016 FINDINGS: Brain: No acute territorial infarction, hemorrhage, or intracranial mass. Mild atrophy. Mild hypodensity in the white matter consistent with chronic small vessel ischemic change. Stable ventricle size Vascular:  No hyperdense vessels.  No unexpected calcification Skull: Normal. Negative for fracture or focal lesion. Sinuses/Orbits: No acute finding. Other: None IMPRESSION: 1. No CT evidence for acute intracranial abnormality. 2. Atrophy and mild chronic small vessel ischemic changes of the white matter. Electronically Signed   By: Donavan Foil M.D.   On: 03/07/2020 21:13   CT Cervical Spine Wo Contrast  Result Date: 03/07/2020 CLINICAL DATA:  Fall EXAM: CT CERVICAL SPINE WITHOUT CONTRAST TECHNIQUE: Multidetector CT imaging of the cervical spine was performed without intravenous contrast. Multiplanar CT image reconstructions were also generated. COMPARISON:  MRI 01/23/2016, radiograph 10/27/2012 FINDINGS: Alignment: 3 mm anterolisthesis C7 on T1. Facet alignment is maintained Skull base and vertebrae: No acute fracture. No primary bone lesion or  focal pathologic process. Soft tissues and spinal canal: No prevertebral fluid or swelling. No visible canal hematoma. Disc levels: Post fusion changes C3 through C7 with anterior plate, fixating screws and interbody devices. Advanced degenerative change at C7-T1. Facet degeneration at multiple levels. Upper chest: Apical emphysema and scarring. Other: None IMPRESSION: Post fusion changes C3 through C7. No acute osseous abnormality. Stable 3 mm anterolisthesis C7 on T1. Electronically Signed   By: Donavan Foil M.D.   On: 03/07/2020 21:20   DG Pelvis Portable  Result Date: 03/07/2020 CLINICAL DATA:  Fall right leg pain EXAM: PORTABLE PELVIS 1-2 VIEWS COMPARISON:  MRI 12/24/2017 FINDINGS: SI joints are non widened. Pubic symphysis and rami appear intact. No fracture or malalignment. Degenerative changes of the hips. Post treatment changes in the region of the prostate IMPRESSION: No acute osseous abnormality. Electronically Signed   By: Donavan Foil M.D.   On: 03/07/2020 18:25   DG Chest Port 1 View  Result Date: 03/07/2020 CLINICAL DATA:  Fall with right pain EXAM: PORTABLE CHEST 1 VIEW COMPARISON:  10/07/2019, 12/30/2017 FINDINGS: Surgical hardware in the cervical spine. Tubular artifact over the left upper chest. Possible right apical opacity. Stable cardiomediastinal silhouette. No pneumothorax. IMPRESSION: Possible right apical airspace disease, question upper lobe atelectasis. Two-view chest radiographic follow-up suggested. Electronically Signed   By: Donavan Foil M.D.   On: 03/07/2020 18:22   DG Femur Min 2 Views Right  Result Date: 03/07/2020 CLINICAL DATA:  Fall with leg rotation EXAM: RIGHT FEMUR 2 VIEWS COMPARISON:  None. FINDINGS: Acute comminuted fracture involving the distal shaft of the femur with 1 shaft diameter anterior displacement of distal fracture fragment. 5 cm of overriding. IMPRESSION: Acute comminuted displaced and overriding distal femur fracture. Electronically Signed   By:  Donavan Foil M.D.   On: 03/07/2020 18:24      Assessment/Plan Principal Problem:   Femur fracture (HCC) Active Problems:   Peripheral axonal neuropathy   Hip fracture (HCC)    1. Acute comminuted distal right femur fracture status post mechanical fall for which orthopedic surgery has been consulted plan is to have surgery in the morning.  We will keep patient n.p.o. past midnight.  Pain medications.  Physical therapy. 2. Chronic anemia follow CBC. 3. History of neuropathy on gabapentin. 4. History of chronic combined systolic and diastolic CHF last EF measured was 40 to 45% with grade 1 diastolic dysfunction and 2025.  Appears euvolemic not on any medications as per the patient. 5. Abnormal chest x-ray is seen will need repeat chest x-ray done and 2 views.  Home medications needs to be verified. Preop EKG is pending.  Since patient has fracture will need close monitoring and inpatient status.   DVT prophylaxis: SCDs. Code Status: DNR.  Family Communication: Discussed with patient. Disposition Plan: Home. Consults called: Orthopedics. Admission status: Inpatient.   Rise Patience MD [facility] Pager [phone].  If 7PM-7AM, please contact night-coverage [email or web address] Password [facility]  03/07/2020, 10:23 PM

## 2020-03-07 NOTE — ED Notes (Signed)
X-ray at bedside

## 2020-03-07 NOTE — ED Triage Notes (Signed)
Pt BIB [facility] d/t a slip on ice of his front porch steps. EMS reports an approx. 4 ft slip/fall. Upon arrival to ED pt has swelling in his Right thigh & c/o pain 10/10 in that location, denies neck or back  Pain. EMS applied a traction device to his Right leg, c-collar in place. A/Ox4, verbal-able to make needs known.

## 2020-03-07 NOTE — Consult Note (Signed)
ORTHOPAEDIC CONSULTATION  REQUESTING PHYSICIAN: Quintella Reichert, MD  PCP:  Lujean Amel, MD  Chief Complaint: fall  HPI: Dustin [name] is a 80 y.o. male who complains of right thigh pain and femur pain.  He was brought to the emergency department by [facility].  He had a fall down about 4 steps today when he slipped on the ice.  He had immediate pain in the right femur.  He also complained of head and neck pain.  He does live independently and helps provide care for his sickly wife.  He denies smoking or diabetes.  He lives in a personal care does have an office second story of his house and spends quite a time then.  Past Medical History:  Diagnosis Date  . BPH (benign prostatic hyperplasia)   . Chronic combined systolic and diastolic congestive heart failure ([place])   . GERD (gastroesophageal reflux disease)    protonix for control  . Insomnia   . Neck pain   . Neuropathy    Past Surgical History:  Procedure Laterality Date  . ANTERIOR CERVICAL DECOMPRESSION/DISCECTOMY FUSION 4 LEVELS N/A 10/27/2012   Procedure: Cervical Three-Four Cervical Four-Five Cervical Five-Six Cervical Six-Seven  Anterior cervical decompression/diskectomy/fusion;  Surgeon: Erline Levine, MD;  Location: [place] NEUR[facility] ORS;  Service: Neurosurgery;  Laterality: N/A;  Cervical Three-Four Cervical Four-Five Cervical Five-Six Cervical Six-Seven  Anterior cervical decompression/diskectomy/fusion  . SHOULDER ARTHROSCOPY  2010   rt shoulder  . SHOULDER ARTHROSCOPY  02/15/2011   Procedure: ARTHROSCOPY SHOULDER;  Surgeon: Cynda Familia;  Location: [place];  Service: Orthopedics;  Laterality: Right;  Debridement  . TONSILLECTOMY     Social History   Socioeconomic History  . Marital status: Married    Spouse name: [name]  . Number of children: 2  . Years of education: college  . Highest education level: Not on file  Occupational History  . Occupation: retired  Tobacco Use  . Smoking  status: Former Smoker    Packs/day: 1.50    Quit date: 01/27/1990    Years since quitting: 30.1  . Smokeless tobacco: Never Used  Vaping Use  . Vaping Use: Never used  Substance and Sexual Activity  . Alcohol use: No  . Drug use: No  . Sexual activity: Not on file  Other Topics Concern  . Not on file  Social History Narrative   [name]   Pt is retired   Veterinary surgeon    Pt is left handed   Pt consumes 2 cups of coffee daily   Social Determinants of Radio broadcast assistant Strain: Not on file  Food Insecurity: Not on file  Transportation Needs: Not on file  Physical Activity: Not on file  Stress: Not on file  Social Connections: Not on file   Family History  Problem Relation Age of Onset  . Emphysema Mother   . Cirrhosis Father   . Stroke Brother    No Known Allergies Prior to Admission medications   Medication Sig Start Date End Date Taking? Authorizing Provider  Buprenorphine HCl-Naloxone HCl 8-2 MG FILM Place 0.5 strips under the tongue 4 (four) times daily. 09/23/19   [provider]  gabapentin (NEURONTIN) 300 MG capsule Take 1 capsule (300 mg total) by mouth 4 (four) times daily. 10/10/19   Eugenie Filler, MD  Melatonin 10 MG TABS Take 10 mg by mouth at bedtime as needed.    [provider]  Multiple Vitamins-Minerals (MULTIVITAMIN  WITH MINERALS) tablet Take 1 tablet by mouth daily.    [provider]  pantoprazole (PROTONIX) 40 MG tablet Take 40 mg by mouth daily.      [provider]  QUEtiapine (SEROQUEL) 400 MG tablet Take 400 mg by mouth at bedtime. 08/31/19   [provider]  rOPINIRole (REQUIP) 0.25 MG tablet Take 0.25 mg by mouth every evening. 07/23/19   [provider]  silodosin (RAPAFLO) 8 MG CAPS capsule Take 8 mg by mouth daily. 08/07/19   [provider]  SODIUM FLUORIDE 5000 PPM 1.1 % PSTE Take 1 application by mouth 2 (two) times daily. Do not eat or drink  for 30 minutes after 09/09/19   [provider]  TANDEM 162-115.2 MG CAPS capsule TAKE 1 CAPSULE BY MOUTH DAILY WITH BREAKFAST. Patient taking differently: Take 1 capsule by mouth daily with breakfast.  07/06/18   Marcial Pacas, MD  traMADol (ULTRAM) 50 MG tablet Take 2 tablets (100 mg total) by mouth every 6 (six) hours as needed for moderate pain. 10/10/19   Eugenie Filler, MD   DG Pelvis Portable  Result Date: 03/07/2020 CLINICAL DATA:  Fall right leg pain EXAM: PORTABLE PELVIS 1-2 VIEWS COMPARISON:  MRI 12/24/2017 FINDINGS: SI joints are non widened. Pubic symphysis and rami appear intact. No fracture or malalignment. Degenerative changes of the hips. Post treatment changes in the region of the prostate IMPRESSION: No acute osseous abnormality. Electronically Signed   By: Donavan Foil M.D.   On: 03/07/2020 18:25   DG Chest Port 1 View  Result Date: 03/07/2020 CLINICAL DATA:  Fall with right pain EXAM: PORTABLE CHEST 1 VIEW COMPARISON:  10/07/2019, 12/30/2017 FINDINGS: Surgical hardware in the cervical spine. Tubular artifact over the left upper chest. Possible right apical opacity. Stable cardiomediastinal silhouette. No pneumothorax. IMPRESSION: Possible right apical airspace disease, question upper lobe atelectasis. Two-view chest radiographic follow-up suggested. Electronically Signed   By: Donavan Foil M.D.   On: 03/07/2020 18:22   DG Femur Min 2 Views Right  Result Date: 03/07/2020 CLINICAL DATA:  Fall with leg rotation EXAM: RIGHT FEMUR 2 VIEWS COMPARISON:  None. FINDINGS: Acute comminuted fracture involving the distal shaft of the femur with 1 shaft diameter anterior displacement of distal fracture fragment. 5 cm of overriding. IMPRESSION: Acute comminuted displaced and overriding distal femur fracture. Electronically Signed   By: Donavan Foil M.D.   On: 03/07/2020 18:24    Positive ROS: All other systems have been reviewed and were otherwise negative with the exception of  those mentioned in the HPI and as above.  Physical Exam: General: Alert, in some distress Cardiovascular: No pedal edema Respiratory: No cyanosis, no use of accessory musculature GI: No organomegaly, abdomen is soft and non-tender Skin: No lesions in the area of chief complaint Neurologic: Sensation intact distally Psychiatric: Patient is competent for consent with normal mood and affect Lymphatic: No axillary or cervical lymphadenopathy  MUSCULOSKELETAL:   Right leg is held externally rotated and shortened.  He has palpable 2+ dorsalis pedis pulse.  He endorses sensation intact light touch throughout.  He has motor intact as well.  At the right thigh no open wounds.  Some swelling and tenderness noted there. Assessment: Closed right distal femur fracture  Plan: -I discussed with Mr. Croston and his son here in the hospital emergency department that he has a significant fracture to the right distal segment of the femur.  This is something that will require operative management.  We discussed that  he will be n.p.o. tonight at midnight.  We will prepare for surgery with Dr. Lennette Bihari Haddix of the orthopedic trauma services.  -For now we have recommended into bed extraction to help with overnight muscle spasticity.  -He will be nonweightbearing.  We will have posted him for surgery tomorrow morning.  -Appreciate the hospitalist service for medical management.    Nicholes Stairs, MD Cell [phone]    03/07/2020 9:07 PM

## 2020-03-07 NOTE — ED Provider Notes (Signed)
[facility] EMERGENCY DEPARTMENT Provider Note   CSN: [id number] Arrival date & time: 03/07/20  1631     History Chief Complaint  Patient presents with  . Fall    Dustin [name] is a 80 y.o. male.  The history is provided by the patient and medical records.   Dustin [name] is a 80 y.o. male who presents to the Emergency Department complaining of fall. He presents the emergency department by EMS for evaluation of injuries following a fall. He lives at home with his wife and was walking outside and slipped on the ice and fell down four steps. He states that he lightly hit his head. He did not pass out. He complains of severe pain to the left hip and thigh. He does not take any blood thinners. He denies any chest pain, abdominal pain, back pain, difficulty breathing.    Past Medical History:  Diagnosis Date  . BPH (benign prostatic hyperplasia)   . Chronic combined systolic and diastolic congestive heart failure ([place])   . GERD (gastroesophageal reflux disease)    protonix for control  . Insomnia   . Neck pain   . Neuropathy     Patient Active Problem List   Diagnosis Date Noted  . Femur fracture ([place]) 03/07/2020  . Hip fracture ([place]) 03/07/2020  . Cellulitis of right lower extremity   . Acute metabolic encephalopathy   . Unspecified fracture of right foot, initial encounter for closed fracture   . Acute urinary retention   . Idiopathic peripheral neuropathy   . Sepsis due to cellulitis ([place]) 10/07/2019  . New onset headache 01/20/2018  . Muscle weakness (generalized) 11/28/2017  . Weakness 11/26/2017  . Right lumbar radiculopathy 09/18/2016  . Right leg pain 07/22/2016  . Restless leg syndrome 05/28/2016  . Iron deficiency anemia 05/28/2016  . Fall 02/10/2016  . Rib fracture 02/10/2016  . Acute encephalopathy 02/10/2016  . Chronic combined systolic (congestive) and diastolic (congestive) heart failure ([place])   . Ataxia   . Hyponatremia  01/22/2016  . Gait instability 01/22/2016  . Urinary retention   . Stroke-like symptom   . Syncope 05/02/2015  . Closed left fibular fracture 05/02/2015  . GERD (gastroesophageal reflux disease) 05/02/2015  . BPH (benign prostatic hyperplasia) 05/02/2015  . Fracture of distal end of left fibula   . 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 Depression 01/30/2015  . 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 ANTERIOR CERVICAL DECOMPRESSION/DISCECTOMY FUSION 4 LEVELS N/A 10/27/2012   Procedure: Cervical Three-Four Cervical Four-Five Cervical Five-Six Cervical Six-Seven  Anterior cervical decompression/diskectomy/fusion;  Surgeon: Erline Levine, MD;  Location: [facility];  Service: Neurosurgery;  Laterality: N/A;  Cervical Three-Four Cervical Four-Five Cervical Five-Six Cervical Six-Seven  Anterior cervical decompression/diskectomy/fusion  . SHOULDER ARTHROSCOPY  2010   rt shoulder  . SHOULDER ARTHROSCOPY  02/15/2011   Procedure: ARTHROSCOPY SHOULDER;  Surgeon: Cynda Familia;  Location: [facility];  Service: Orthopedics;  Laterality: Right;  Debridement  . TONSILLECTOMY         Family History  Problem Relation Age of Onset  . Emphysema Mother   . Cirrhosis Father   . Stroke Brother     Social History   Tobacco Use  . Smoking status: Former Smoker    Packs/day: 1.50    Quit date: 01/27/1990    Years since quitting: 30.1  . Smokeless tobacco: Never Used  Vaping Use  . Vaping Use:  Never used  Substance Use Topics  . Alcohol use: No  . Drug use: No    Home Medications Prior to Admission medications   Medication Sig Start Date End Date Taking? Authorizing Provider  Buprenorphine HCl-Naloxone HCl 8-2 MG FILM Place 0.5 strips under the tongue 4 (four) times daily. 09/23/19   [provider]  gabapentin (NEURONTIN) 300 MG capsule Take 1 capsule (300 mg total) by mouth 4 (four) times daily. 10/10/19   Eugenie Filler, MD  Melatonin 10 MG TABS Take 10 mg by mouth at bedtime as needed.    [provider]  Multiple Vitamins-Minerals (MULTIVITAMIN WITH MINERALS) tablet Take 1 tablet by mouth daily.    [provider]  pantoprazole (PROTONIX) 40 MG tablet Take 40 mg by mouth daily.      [provider]  QUEtiapine (SEROQUEL) 400 MG tablet Take 400 mg by mouth at bedtime. 08/31/19   [provider]  rOPINIRole (REQUIP) 0.25 MG tablet Take 0.25 mg by mouth every evening. 07/23/19   [provider]  silodosin (RAPAFLO) 8 MG CAPS capsule Take 8 mg by mouth daily. 08/07/19   [provider]  SODIUM FLUORIDE 5000 PPM 1.1 % PSTE Take 1 application by mouth 2 (two) times daily. Do not eat or drink for 30 minutes after 09/09/19   [provider]  TANDEM 162-115.2 MG CAPS capsule TAKE 1 CAPSULE BY MOUTH DAILY WITH BREAKFAST. Patient taking differently: Take 1 capsule by mouth daily with breakfast.  07/06/18   Marcial Pacas, MD  traMADol (ULTRAM) 50 MG tablet Take 2 tablets (100 mg total) by mouth every 6 (six) hours as needed for moderate pain. 10/10/19   Eugenie Filler, MD    Allergies    Patient has no known allergies.  Review of Systems   Review of Systems  All other systems reviewed and are negative.   Physical Exam Updated Vital Signs BP 123/75   Pulse 100   Resp 11   Ht 6\' 2"  (1.88 m)   Wt 91.6 kg   SpO2 96%   BMI 25.94 kg/m   Physical Exam Vitals and nursing note reviewed.  Constitutional:      General: He is in acute distress.     Appearance: He is well-developed and well-nourished.  HENT:     Head: Normocephalic and atraumatic.  Cardiovascular:     Rate and Rhythm: Normal rate and regular rhythm.     Heart sounds: No murmur heard.   Pulmonary:     Effort: Pulmonary effort is normal. No respiratory distress.     Breath sounds: Normal breath sounds.  Abdominal:     Palpations: Abdomen is soft.     Tenderness: There is no  abdominal tenderness. There is no guarding or rebound.  Musculoskeletal:        General: No edema.     Comments: 2+ DP pulses bilaterally. There is no tenderness to palpation over the hips, knees, ankles. There is significant tenderness, swelling to the right thigh. The right lower extremity is externally rotated.  Skin:    General: Skin is warm and dry.  Neurological:     Mental Status: He is alert and oriented to person, place, and time.  Psychiatric:        Mood and Affect: Mood and affect normal.        Behavior: Behavior normal.     ED Results / Procedures / Treatments   Labs (all labs ordered are listed, but  only abnormal results are displayed) Labs Reviewed  COMPREHENSIVE METABOLIC PANEL - Abnormal; Notable for the following components:      Result Value   Glucose, Bld 121 (*)    Creatinine, Ser 1.28 (*)    GFR, Estimated 57 (*)    All other components within normal limits  CBC WITH DIFFERENTIAL/PLATELET - Abnormal; Notable for the following components:   RBC 3.87 (*)    Hemoglobin 11.2 (*)    HCT 34.1 (*)    All other components within normal limits  RESP PANEL BY RT-PCR (FLU A&B, COVID) ARPGX2  PROTIME-INR  CBC  BASIC METABOLIC PANEL  TYPE AND SCREEN  ABO/RH    EKG None  Radiology CT Head Wo Contrast  Result Date: 03/07/2020 CLINICAL DATA:  Head trauma fell on ice EXAM: CT HEAD WITHOUT CONTRAST TECHNIQUE: Contiguous axial images were obtained from the base of the skull through the vertex without intravenous contrast. COMPARISON:  CT brain 02/09/2016 FINDINGS: Brain: No acute territorial infarction, hemorrhage, or intracranial mass. Mild atrophy. Mild hypodensity in the white matter consistent with chronic small vessel ischemic change. Stable ventricle size Vascular: No hyperdense vessels.  No unexpected calcification Skull: Normal. Negative for fracture or focal lesion. Sinuses/Orbits: No acute finding. Other: None IMPRESSION: 1. No CT evidence for acute  intracranial abnormality. 2. Atrophy and mild chronic small vessel ischemic changes of the white matter. Electronically Signed   By: Donavan Foil M.D.   On: 03/07/2020 21:13   CT Cervical Spine Wo Contrast  Result Date: 03/07/2020 CLINICAL DATA:  Fall EXAM: CT CERVICAL SPINE WITHOUT CONTRAST TECHNIQUE: Multidetector CT imaging of the cervical spine was performed without intravenous contrast. Multiplanar CT image reconstructions were also generated. COMPARISON:  MRI 01/23/2016, radiograph 10/27/2012 FINDINGS: Alignment: 3 mm anterolisthesis C7 on T1. Facet alignment is maintained Skull base and vertebrae: No acute fracture. No primary bone lesion or focal pathologic process. Soft tissues and spinal canal: No prevertebral fluid or swelling. No visible canal hematoma. Disc levels: Post fusion changes C3 through C7 with anterior plate, fixating screws and interbody devices. Advanced degenerative change at C7-T1. Facet degeneration at multiple levels. Upper chest: Apical emphysema and scarring. Other: None IMPRESSION: Post fusion changes C3 through C7. No acute osseous abnormality. Stable 3 mm anterolisthesis C7 on T1. Electronically Signed   By: Donavan Foil M.D.   On: 03/07/2020 21:20   DG Pelvis Portable  Result Date: 03/07/2020 CLINICAL DATA:  Fall right leg pain EXAM: PORTABLE PELVIS 1-2 VIEWS COMPARISON:  MRI 12/24/2017 FINDINGS: SI joints are non widened. Pubic symphysis and rami appear intact. No fracture or malalignment. Degenerative changes of the hips. Post treatment changes in the region of the prostate IMPRESSION: No acute osseous abnormality. Electronically Signed   By: Donavan Foil M.D.   On: 03/07/2020 18:25   DG Chest Port 1 View  Result Date: 03/07/2020 CLINICAL DATA:  Fall with right pain EXAM: PORTABLE CHEST 1 VIEW COMPARISON:  10/07/2019, 12/30/2017 FINDINGS: Surgical hardware in the cervical spine. Tubular artifact over the left upper chest. Possible right apical opacity. Stable  cardiomediastinal silhouette. No pneumothorax. IMPRESSION: Possible right apical airspace disease, question upper lobe atelectasis. Two-view chest radiographic follow-up suggested. Electronically Signed   By: Donavan Foil M.D.   On: 03/07/2020 18:22   DG Femur Min 2 Views Right  Result Date: 03/07/2020 CLINICAL DATA:  Fall with leg rotation EXAM: RIGHT FEMUR 2 VIEWS COMPARISON:  None. FINDINGS: Acute comminuted fracture involving the distal shaft of the femur with 1  shaft diameter anterior displacement of distal fracture fragment. 5 cm of overriding. IMPRESSION: Acute comminuted displaced and overriding distal femur fracture. Electronically Signed   By: Donavan Foil M.D.   On: 03/07/2020 18:24    Procedures Procedures (including critical care time)  Medications Ordered in ED Medications  gabapentin (NEURONTIN) capsule 300 mg (300 mg Oral Given 03/07/20 2240)  morphine 2 MG/ML injection 0.5 mg (0.5 mg Intravenous Given 03/07/20 2238)  HYDROmorphone (DILAUDID) injection 1 mg (1 mg Intravenous Given 03/07/20 1645)  HYDROmorphone (DILAUDID) injection 0.5 mg (0.5 mg Intravenous Given 03/07/20 1713)  methocarbamol (ROBAXIN) 500 mg in dextrose 5 % 50 mL IVPB (0 mg Intravenous Stopped 03/07/20 1909)  HYDROmorphone (DILAUDID) injection 1 mg (1 mg Intravenous Given 03/07/20 2057)    ED Course  I have reviewed the triage vital signs and the nursing notes.  Pertinent labs & imaging results that were available during my care of the patient were reviewed by me and considered in my medical decision making (see chart for details).    MDM Rules/Calculators/A&P                         patient here for evaluation of injuries following a fall on ice. He has significant pain and deformity to the right femur. He is distal pulses intact. Imaging significant for mid shaft femur fracture. Discussed with Dr. Stann Mainland with orthopedics, recommends Buck's traction and he will see the patient and consult. No evidence of  additional serious injuries related to fall, hospitalist consulted for admission. Patient updated findings and studies recommendation for admission and he is in agreement with treatment plan.  Final Clinical Impression(s) / ED Diagnoses Final diagnoses:  Closed displaced oblique fracture of shaft of right femur, initial encounter [facility])    Rx / [facility] Orders ED Discharge Orders    None       Quintella Reichert, MD 03/08/20 0013

## 2020-03-07 NOTE — Progress Notes (Signed)
Orthopedic Tech Progress Note Patient Details:  Dustin [name] May 20, 1940 [id number]  Musculoskeletal Traction Type of Traction: Bucks Skin Traction Traction Location: Right Lower Extremity Traction Weight: 15 lbs   Post Interventions Patient Tolerated: Well   Tammy Sours 03/07/2020, 9:27 PM

## 2020-03-08 ENCOUNTER — Inpatient Hospital Stay (HOSPITAL_COMMUNITY): Payer: Medicare Other

## 2020-03-08 ENCOUNTER — Inpatient Hospital Stay (HOSPITAL_COMMUNITY): Payer: Medicare Other | Admitting: Anesthesiology

## 2020-03-08 ENCOUNTER — Encounter (HOSPITAL_COMMUNITY)
Admission: EM | Disposition: A | Discharge status: Skilled Nursing Facility | Payer: Self-pay | Source: Home / Self Care | Attending: Family Medicine

## 2020-03-08 ENCOUNTER — Encounter (HOSPITAL_COMMUNITY): Payer: Self-pay | Admitting: Internal Medicine

## 2020-03-08 DIAGNOSIS — S72301A Unspecified fracture of shaft of right femur, initial encounter for closed fracture: Secondary | ICD-10-CM

## 2020-03-08 DIAGNOSIS — S72009A Fracture of unspecified part of neck of unspecified femur, initial encounter for closed fracture: Secondary | ICD-10-CM

## 2020-03-08 HISTORY — PX: FEMUR IM NAIL: SHX1597

## 2020-03-08 LAB — CBC
HCT: 28.4 % — ABNORMAL LOW (ref 39.0–52.0)
HCT: 28.7 % — ABNORMAL LOW (ref 39.0–52.0)
Hemoglobin: 10 g/dL — ABNORMAL LOW (ref 13.0–17.0)
Hemoglobin: 10.1 g/dL — ABNORMAL LOW (ref 13.0–17.0)
MCH: 29.9 pg (ref 26.0–34.0)
MCH: 30.4 pg (ref 26.0–34.0)
MCHC: 35.2 g/dL (ref 30.0–36.0)
MCHC: 35.2 g/dL (ref 30.0–36.0)
MCV: 85 fL (ref 80.0–100.0)
MCV: 86.4 fL (ref 80.0–100.0)
Platelets: 181 10*3/uL (ref 150–400)
Platelets: 214 10*3/uL (ref 150–400)
RBC: 3.34 MIL/uL — ABNORMAL LOW (ref 4.22–5.81)
RBC: 3.32 MIL/uL — ABNORMAL LOW (ref 4.22–5.81)
RDW: 13.2 % (ref 11.5–15.5)
RDW: 13.2 % (ref 11.5–15.5)
WBC: 9.8 10*3/uL (ref 4.0–10.5)
WBC: 16.2 10*3/uL — ABNORMAL HIGH (ref 4.0–10.5)
nRBC: 0 % (ref 0.0–0.2)
nRBC: 0 % (ref 0.0–0.2)

## 2020-03-08 LAB — BASIC METABOLIC PANEL
Anion gap: 11 (ref 5–15)
BUN: 14 mg/dL (ref 8–23)
CO2: 21 mmol/L — ABNORMAL LOW (ref 22–32)
Calcium: 9 mg/dL (ref 8.9–10.3)
Chloride: 100 mmol/L (ref 98–111)
Creatinine, Ser: 1.24 mg/dL (ref 0.61–1.24)
GFR, Estimated: 60 mL/min — ABNORMAL LOW (ref >60)
Glucose, Bld: 131 mg/dL — ABNORMAL HIGH (ref 70–99)
Potassium: 4.1 mmol/L (ref 3.5–5.1)
Sodium: 132 mmol/L — ABNORMAL LOW (ref 135–145)

## 2020-03-08 LAB — SURGICAL PCR SCREEN
MRSA, PCR: NEGATIVE
Staphylococcus aureus: NEGATIVE

## 2020-03-08 LAB — RESP PANEL BY RT-PCR (FLU A&B, COVID) ARPGX2
Influenza A by PCR: NEGATIVE
Influenza B by PCR: NEGATIVE
SARS Coronavirus 2 by RT PCR: NEGATIVE

## 2020-03-08 LAB — ABO/RH: ABO/RH(D): A POS

## 2020-03-08 LAB — CREATININE, SERUM
Creatinine, Ser: 1.38 mg/dL — ABNORMAL HIGH (ref 0.61–1.24)
GFR, Estimated: 52 mL/min — ABNORMAL LOW (ref >60)

## 2020-03-08 SURGERY — INSERTION, INTRAMEDULLARY ROD, FEMUR, RETROGRADE
Anesthesia: General | Site: Leg Upper | Laterality: Right

## 2020-03-08 MED ORDER — KETOROLAC TROMETHAMINE 30 MG/ML IJ SOLN
30.0000 mg | Freq: Once | INTRAMUSCULAR | Status: AC
  Administered 2020-03-08: 30 mg via INTRAVENOUS

## 2020-03-08 MED ORDER — FENTANYL CITRATE (PF) 250 MCG/5ML IJ SOLN
INTRAMUSCULAR | Status: AC
  Filled 2020-03-08: qty 5

## 2020-03-08 MED ORDER — CEFAZOLIN SODIUM-DEXTROSE 2-4 GM/100ML-% IV SOLN
2.0000 g | Freq: Three times a day (TID) | INTRAVENOUS | Status: AC
  Administered 2020-03-08 – 2020-03-09 (×3): 2 g via INTRAVENOUS
  Filled 2020-03-08 (×3): qty 100

## 2020-03-08 MED ORDER — METOCLOPRAMIDE HCL 5 MG PO TABS: 5.0000 mg | ORAL_TABLET | Freq: Three times a day (TID) | ORAL | Status: DC | PRN

## 2020-03-08 MED ORDER — MORPHINE SULFATE (PF) 2 MG/ML IV SOLN
0.5000 mg | INTRAVENOUS | Status: DC | PRN
  Administered 2020-03-09 (×2): 0.5 mg via INTRAVENOUS
  Filled 2020-03-08 (×2): qty 1

## 2020-03-08 MED ORDER — ENOXAPARIN SODIUM 40 MG/0.4ML ~~LOC~~ SOLN
40.0000 mg | SUBCUTANEOUS | Status: DC
  Administered 2020-03-09 – 2020-03-16 (×8): 40 mg via SUBCUTANEOUS
  Filled 2020-03-08 (×9): qty 0.4

## 2020-03-08 MED ORDER — LIDOCAINE 2% (20 MG/ML) 5 ML SYRINGE
INTRAMUSCULAR | Status: DC | PRN
  Administered 2020-03-08: 100 mg via INTRAVENOUS

## 2020-03-08 MED ORDER — PROPOFOL 10 MG/ML IV BOLUS
INTRAVENOUS | Status: AC
  Filled 2020-03-08: qty 20

## 2020-03-08 MED ORDER — DEXAMETHASONE SODIUM PHOSPHATE 10 MG/ML IJ SOLN
INTRAMUSCULAR | Status: DC | PRN
  Administered 2020-03-08: 4 mg via INTRAVENOUS

## 2020-03-08 MED ORDER — HYDROMORPHONE HCL 1 MG/ML IJ SOLN
0.2500 mg | INTRAMUSCULAR | Status: DC | PRN
  Administered 2020-03-08 (×4): 0.5 mg via INTRAVENOUS

## 2020-03-08 MED ORDER — HYDROMORPHONE HCL 1 MG/ML IJ SOLN
1.0000 mg | Freq: Once | INTRAMUSCULAR | Status: AC
  Administered 2020-03-08: 1 mg via INTRAVENOUS
  Filled 2020-03-08: qty 1

## 2020-03-08 MED ORDER — ENSURE PRE-SURGERY PO LIQD
296.0000 mL | Freq: Once | ORAL | Status: AC
  Administered 2020-03-08: 296 mL via ORAL
  Filled 2020-03-08: qty 296

## 2020-03-08 MED ORDER — MIDAZOLAM HCL 2 MG/2ML IJ SOLN
INTRAMUSCULAR | Status: AC
  Filled 2020-03-08: qty 2

## 2020-03-08 MED ORDER — FENTANYL CITRATE (PF) 100 MCG/2ML IJ SOLN
INTRAMUSCULAR | Status: DC | PRN
  Administered 2020-03-08: 50 ug via INTRAVENOUS
  Administered 2020-03-08: 100 ug via INTRAVENOUS
  Administered 2020-03-08 (×2): 50 ug via INTRAVENOUS

## 2020-03-08 MED ORDER — SUGAMMADEX SODIUM 200 MG/2ML IV SOLN
INTRAVENOUS | Status: DC | PRN
  Administered 2020-03-08: 200 mg via INTRAVENOUS

## 2020-03-08 MED ORDER — ESMOLOL HCL 100 MG/10ML IV SOLN
INTRAVENOUS | Status: DC | PRN
  Administered 2020-03-08: 30 mg via INTRAVENOUS

## 2020-03-08 MED ORDER — POVIDONE-IODINE 10 % EX SWAB
2.0000 "application " | Freq: Once | CUTANEOUS | Status: AC
  Administered 2020-03-08: 2 via TOPICAL

## 2020-03-08 MED ORDER — HYDROMORPHONE HCL 1 MG/ML IJ SOLN
INTRAMUSCULAR | Status: AC
  Filled 2020-03-08: qty 1

## 2020-03-08 MED ORDER — METHOCARBAMOL 1000 MG/10ML IJ SOLN
500.0000 mg | Freq: Four times a day (QID) | INTRAVENOUS | Status: DC | PRN
  Administered 2020-03-08: 500 mg via INTRAVENOUS
  Filled 2020-03-08 (×2): qty 5

## 2020-03-08 MED ORDER — VANCOMYCIN HCL 1000 MG IV SOLR
INTRAVENOUS | Status: AC
  Filled 2020-03-08: qty 1000

## 2020-03-08 MED ORDER — ACETAMINOPHEN 325 MG PO TABS
325.0000 mg | ORAL_TABLET | Freq: Four times a day (QID) | ORAL | Status: DC | PRN
  Administered 2020-03-16: 650 mg via ORAL
  Filled 2020-03-08: qty 2

## 2020-03-08 MED ORDER — DEXAMETHASONE SODIUM PHOSPHATE 10 MG/ML IJ SOLN
INTRAMUSCULAR | Status: AC
  Filled 2020-03-08: qty 1

## 2020-03-08 MED ORDER — POLYETHYLENE GLYCOL 3350 17 G PO PACK: 17.0000 g | PACK | Freq: Every day | ORAL | Status: DC | PRN

## 2020-03-08 MED ORDER — ONDANSETRON HCL 4 MG/2ML IJ SOLN
INTRAMUSCULAR | Status: DC | PRN
  Administered 2020-03-08: 4 mg via INTRAVENOUS

## 2020-03-08 MED ORDER — ADULT MULTIVITAMIN W/MINERALS CH
1.0000 | ORAL_TABLET | Freq: Every day | ORAL | Status: DC
  Administered 2020-03-08 – 2020-03-17 (×10): 1 via ORAL
  Filled 2020-03-08 (×10): qty 1

## 2020-03-08 MED ORDER — ROCURONIUM BROMIDE 10 MG/ML (PF) SYRINGE
PREFILLED_SYRINGE | INTRAVENOUS | Status: DC | PRN
  Administered 2020-03-08: 60 mg via INTRAVENOUS

## 2020-03-08 MED ORDER — POTASSIUM CHLORIDE IN NACL 20-0.9 MEQ/L-% IV SOLN: INTRAVENOUS | Status: DC

## 2020-03-08 MED ORDER — LIDOCAINE 2% (20 MG/ML) 5 ML SYRINGE
INTRAMUSCULAR | Status: AC
  Filled 2020-03-08: qty 5

## 2020-03-08 MED ORDER — CHLORHEXIDINE GLUCONATE 0.12 % MT SOLN
OROMUCOSAL | Status: AC
  Filled 2020-03-08: qty 15

## 2020-03-08 MED ORDER — ONDANSETRON HCL 4 MG PO TABS: 4.0000 mg | ORAL_TABLET | Freq: Four times a day (QID) | ORAL | Status: DC | PRN

## 2020-03-08 MED ORDER — ROCURONIUM BROMIDE 10 MG/ML (PF) SYRINGE
PREFILLED_SYRINGE | INTRAVENOUS | Status: AC
  Filled 2020-03-08: qty 10

## 2020-03-08 MED ORDER — CHLORHEXIDINE GLUCONATE 4 % EX LIQD: 60.0000 mL | Freq: Once | CUTANEOUS | Status: DC

## 2020-03-08 MED ORDER — HYDROCODONE-ACETAMINOPHEN 5-325 MG PO TABS
1.0000 | ORAL_TABLET | ORAL | Status: DC | PRN
  Administered 2020-03-09: 1 via ORAL
  Filled 2020-03-08: qty 1

## 2020-03-08 MED ORDER — PROMETHAZINE HCL 25 MG/ML IJ SOLN: 6.2500 mg | INTRAMUSCULAR | Status: DC | PRN

## 2020-03-08 MED ORDER — DOCUSATE SODIUM 100 MG PO CAPS
100.0000 mg | ORAL_CAPSULE | Freq: Two times a day (BID) | ORAL | Status: DC
  Administered 2020-03-08 – 2020-03-16 (×13): 100 mg via ORAL
  Filled 2020-03-08 (×17): qty 1

## 2020-03-08 MED ORDER — CEFAZOLIN SODIUM-DEXTROSE 2-4 GM/100ML-% IV SOLN
2.0000 g | INTRAVENOUS | Status: AC
  Administered 2020-03-08: 2 g via INTRAVENOUS
  Filled 2020-03-08: qty 100

## 2020-03-08 MED ORDER — ORAL CARE MOUTH RINSE: 15.0000 mL | Freq: Once | OROMUCOSAL | Status: AC

## 2020-03-08 MED ORDER — KETOROLAC TROMETHAMINE 30 MG/ML IJ SOLN
INTRAMUSCULAR | Status: AC
  Filled 2020-03-08: qty 1

## 2020-03-08 MED ORDER — PROPOFOL 10 MG/ML IV BOLUS
INTRAVENOUS | Status: DC | PRN
  Administered 2020-03-08: 130 mg via INTRAVENOUS
  Administered 2020-03-08: 20 mg via INTRAVENOUS

## 2020-03-08 MED ORDER — MIDAZOLAM HCL 5 MG/5ML IJ SOLN
INTRAMUSCULAR | Status: DC | PRN
  Administered 2020-03-08: 2 mg via INTRAVENOUS

## 2020-03-08 MED ORDER — OXYCODONE HCL 5 MG PO TABS: 5.0000 mg | ORAL_TABLET | Freq: Once | ORAL | Status: DC

## 2020-03-08 MED ORDER — METHOCARBAMOL 500 MG PO TABS
500.0000 mg | ORAL_TABLET | Freq: Four times a day (QID) | ORAL | Status: DC | PRN
  Administered 2020-03-08 – 2020-03-15 (×7): 500 mg via ORAL
  Filled 2020-03-08 (×8): qty 1

## 2020-03-08 MED ORDER — VANCOMYCIN HCL 1000 MG IV SOLR
INTRAVENOUS | Status: DC | PRN
  Administered 2020-03-08: 1000 mg

## 2020-03-08 MED ORDER — LACTATED RINGERS IV SOLN: INTRAVENOUS | Status: DC

## 2020-03-08 MED ORDER — ONDANSETRON HCL 4 MG/2ML IJ SOLN: 4.0000 mg | Freq: Four times a day (QID) | INTRAMUSCULAR | Status: DC | PRN

## 2020-03-08 MED ORDER — CHLORHEXIDINE GLUCONATE 0.12 % MT SOLN
15.0000 mL | Freq: Once | OROMUCOSAL | Status: AC
  Administered 2020-03-08: 15 mL via OROMUCOSAL

## 2020-03-08 MED ORDER — 0.9 % SODIUM CHLORIDE (POUR BTL) OPTIME
TOPICAL | Status: DC | PRN
  Administered 2020-03-08: 1000 mL

## 2020-03-08 MED ORDER — ONDANSETRON HCL 4 MG/2ML IJ SOLN
INTRAMUSCULAR | Status: AC
  Filled 2020-03-08: qty 2

## 2020-03-08 MED ORDER — HYDROCODONE-ACETAMINOPHEN 7.5-325 MG PO TABS
1.0000 | ORAL_TABLET | ORAL | Status: DC | PRN
  Administered 2020-03-08 (×2): 2 via ORAL
  Administered 2020-03-09: 1 via ORAL
  Filled 2020-03-08 (×3): qty 2

## 2020-03-08 MED ORDER — ACETAMINOPHEN 500 MG PO TABS
1000.0000 mg | ORAL_TABLET | Freq: Once | ORAL | Status: AC
  Administered 2020-03-08: 1000 mg via ORAL
  Filled 2020-03-08: qty 2

## 2020-03-08 MED ORDER — METOCLOPRAMIDE HCL 5 MG/ML IJ SOLN: 5.0000 mg | Freq: Three times a day (TID) | INTRAMUSCULAR | Status: DC | PRN

## 2020-03-08 SURGICAL SUPPLY — 67 items
ADH SKN CLS APL DERMABOND .7 (GAUZE/BANDAGES/DRESSINGS) ×1
APL PRP STRL LF DISP 70% ISPRP (MISCELLANEOUS) ×3
BIT DRILL CALIBRATED 4.2 (BIT) IMPLANT
BIT DRILL SHORT 4.2 (BIT) IMPLANT
BNDG CMPR MED 10X6 ELC LF (GAUZE/BANDAGES/DRESSINGS) ×1
BNDG COHESIVE 4X5 TAN STRL (GAUZE/BANDAGES/DRESSINGS) ×3 IMPLANT
BNDG ELASTIC 4X5.8 VLCR STR LF (GAUZE/BANDAGES/DRESSINGS) ×1 IMPLANT
BNDG ELASTIC 6X10 VLCR STRL LF (GAUZE/BANDAGES/DRESSINGS) ×2 IMPLANT
BNDG ELASTIC 6X5.8 VLCR STR LF (GAUZE/BANDAGES/DRESSINGS) ×1 IMPLANT
CHLORAPREP W/TINT 26 (MISCELLANEOUS) ×7 IMPLANT
CLOSURE WOUND 1/2 X4 (GAUZE/BANDAGES/DRESSINGS) ×1
COVER MAYO STAND STRL (DRAPES) ×3 IMPLANT
COVER SURGICAL LIGHT HANDLE (MISCELLANEOUS) ×6 IMPLANT
COVER WAND RF STERILE (DRAPES) ×1 IMPLANT
DERMABOND ADVANCED (GAUZE/BANDAGES/DRESSINGS) ×2
DERMABOND ADVANCED .7 DNX12 (GAUZE/BANDAGES/DRESSINGS) ×1 IMPLANT
DRAPE C-ARM 42X72 X-RAY (DRAPES) ×3 IMPLANT
DRAPE C-ARMOR (DRAPES) ×3 IMPLANT
DRAPE HALF SHEET 40X57 (DRAPES) ×6 IMPLANT
DRAPE IMP U-DRAPE 54X76 (DRAPES) ×6 IMPLANT
DRAPE INCISE IOBAN 66X45 STRL (DRAPES) ×3 IMPLANT
DRAPE ORTHO SPLIT 77X108 STRL (DRAPES) ×6
DRAPE SURG 17X23 STRL (DRAPES) ×1 IMPLANT
DRAPE SURG ORHT 6 SPLT 77X108 (DRAPES) ×2 IMPLANT
DRAPE U-SHAPE 47X51 STRL (DRAPES) ×3 IMPLANT
DRILL BIT CALIBRATED 4.2 (BIT) ×3
DRILL BIT SHORT 4.2 (BIT) ×6
DRSG MEPILEX BORDER 4X4 (GAUZE/BANDAGES/DRESSINGS) ×6 IMPLANT
ELECT REM PT RETURN 9FT ADLT (ELECTROSURGICAL) ×3
ELECTRODE REM PT RTRN 9FT ADLT (ELECTROSURGICAL) ×1 IMPLANT
GAUZE SPONGE 4X4 12PLY STRL (GAUZE/BANDAGES/DRESSINGS) ×1 IMPLANT
GLOVE BIO SURGEON STRL SZ 6.5 (GLOVE) ×5 IMPLANT
GLOVE BIO SURGEON STRL SZ7.5 (GLOVE) ×8 IMPLANT
GLOVE BIO SURGEONS STRL SZ 6.5 (GLOVE) ×2
GLOVE BIOGEL PI IND STRL 7.5 (GLOVE) ×1 IMPLANT
GLOVE BIOGEL PI INDICATOR 7.5 (GLOVE) ×2
GLOVE SURG UNDER POLY LF SZ6.5 (GLOVE) ×3 IMPLANT
GOWN STRL REUS W/ TWL LRG LVL3 (GOWN DISPOSABLE) ×2 IMPLANT
GOWN STRL REUS W/TWL LRG LVL3 (GOWN DISPOSABLE) ×6
GUIDEWIRE 3.2X400 (WIRE) ×2 IMPLANT
KIT BASIN OR (CUSTOM PROCEDURE TRAY) ×3 IMPLANT
KIT TURNOVER KIT B (KITS) ×3 IMPLANT
NAIL FEM RFNA BEND 11X400 5D (Nail) ×2 IMPLANT
PACK ORTHO EXTREMITY (CUSTOM PROCEDURE TRAY) ×3 IMPLANT
PAD ARMBOARD 7.5X6 YLW CONV (MISCELLANEOUS) ×6 IMPLANT
PAD CAST 4YDX4 CTTN HI CHSV (CAST SUPPLIES) IMPLANT
PADDING CAST COTTON 4X4 STRL (CAST SUPPLIES) ×3
PADDING CAST COTTON 6X4 STRL (CAST SUPPLIES) ×2 IMPLANT
REAMER ROD DEEP FLUTE 2.5X950 (INSTRUMENTS) ×2 IMPLANT
SCREW LOCK IM 5X86 (Screw) ×2 IMPLANT
SCREW LOCK IM NL 5X38 (Screw) ×2 IMPLANT
SCREW LOCK IM NL 5X76 (Screw) ×2 IMPLANT
SCREW LOCK IM NL 5X80 (Screw) ×2 IMPLANT
SCREW LOCK IM TI 5X42 (Screw) ×2 IMPLANT
SPONGE LAP 18X18 RF (DISPOSABLE) ×3 IMPLANT
STAPLER VISISTAT 35W (STAPLE) ×3 IMPLANT
STRIP CLOSURE SKIN 1/2X4 (GAUZE/BANDAGES/DRESSINGS) ×1 IMPLANT
SUT MNCRL AB 3-0 PS2 18 (SUTURE) ×3 IMPLANT
SUT VIC AB 0 CT1 27 (SUTURE) ×3
SUT VIC AB 0 CT1 27XBRD ANBCTR (SUTURE) IMPLANT
SUT VIC AB 2-0 CT1 27 (SUTURE) ×3
SUT VIC AB 2-0 CT1 TAPERPNT 27 (SUTURE) IMPLANT
TOWEL GREEN STERILE (TOWEL DISPOSABLE) ×6 IMPLANT
TOWEL GREEN STERILE FF (TOWEL DISPOSABLE) ×3 IMPLANT
TUBE CONNECTING 12'X1/4 (SUCTIONS) ×1
TUBE CONNECTING 12X1/4 (SUCTIONS) ×2 IMPLANT
YANKAUER SUCT BULB TIP NO VENT (SUCTIONS) ×3 IMPLANT

## 2020-03-08 NOTE — Consult Note (Signed)
Orthopaedic Trauma Service (OTS) Consult   Patient ID: Dustin [name] MRN: [id number] DOB/AGE: 1940/11/06 80 y.o.  Reason for Consult:Right femur fracture Referring Physician: Dr. Victorino December, MD Dustin [name]  HPI: Dustin [name] is an 80 y.o. male who is being seen in consultation at the request of Dr. Stann Mainland for evaluation of right femur fracture.  The patient was at home where he slipped and fell and landed on his leg.  He had immediate pain in his right thigh.  Was brought in to the emergency room where x-rays showed a right femoral shaft fracture.  He was placed in [place] skeletal traction.  Due to OR availability Dr. Stann Mainland asked that I take over care for the patient.  Patient was seen and evaluated in the preoperative holding area.  Currently complaining of severe pain in his right thigh.  States that he cannot get comfortable.  He has baseline neuropathy but denies any new numbness or tingling.  He lives at home with his wife who he is the primary caretaker for.  He has 3 steps to enter.  His bathroom and bedroom is on the downstairs floor.  Denies any other injuries to his bilateral upper extremities or left lower extremity.  States that he does not use a cane or a walker for ambulation.  His daughter-in-law is home taking care of his wife currently.  Past Medical History:  Diagnosis Date  . BPH (benign prostatic hyperplasia)   . Chronic combined systolic and diastolic congestive heart failure ([place])   . GERD (gastroesophageal reflux disease)    protonix for control  . Insomnia   . Neck pain   . Neuropathy     Past Surgical History:  Procedure Laterality Date  . ANTERIOR CERVICAL DECOMPRESSION/DISCECTOMY FUSION 4 LEVELS N/A 10/27/2012   Procedure: Cervical Three-Four Cervical Four-Five Cervical Five-Six Cervical Six-Seven  Anterior cervical decompression/diskectomy/fusion;  Surgeon: Erline Levine, MD;  Location: [facility];  Service: Neurosurgery;  Laterality: N/A;  Cervical  Three-Four Cervical Four-Five Cervical Five-Six Cervical Six-Seven  Anterior cervical decompression/diskectomy/fusion  . SHOULDER ARTHROSCOPY  2010   rt shoulder  . SHOULDER ARTHROSCOPY  02/15/2011   Procedure: ARTHROSCOPY SHOULDER;  Surgeon: Cynda Familia;  Location: [facility];  Service: Orthopedics;  Laterality: Right;  Debridement  . TONSILLECTOMY      Family History  Problem Relation Age of Onset  . Emphysema Mother   . Cirrhosis Father   . Stroke Brother     Social History:  reports that he quit smoking about 30 years ago. He smoked 1.50 packs per day. He has never used smokeless tobacco. He reports that he does not drink alcohol and does not use drugs.  Allergies: No Known Allergies  Medications:  No current facility-administered medications on file prior to encounter.   Current Outpatient Medications on File Prior to Encounter  Medication Sig Dispense Refill  . Buprenorphine HCl-Naloxone HCl 8-2 MG FILM Place 0.5 strips under the tongue 4 (four) times daily.    Marland Kitchen gabapentin (NEURONTIN) 300 MG capsule Take 1 capsule (300 mg total) by mouth 4 (four) times daily.    . Melatonin 10 MG TABS Take 10 mg by mouth at bedtime as needed.    . Multiple Vitamins-Minerals (MULTIVITAMIN WITH MINERALS) tablet Take 1 tablet by mouth daily.    . pantoprazole (PROTONIX) 40 MG tablet Take 40 mg by mouth daily.      . QUEtiapine (SEROQUEL) 400 MG tablet Take 400 mg by mouth at bedtime.    Marland Kitchen  rOPINIRole (REQUIP) 0.25 MG tablet Take 0.25 mg by mouth every evening.    . silodosin (RAPAFLO) 8 MG CAPS capsule Take 8 mg by mouth daily.    . SODIUM FLUORIDE 5000 PPM 1.1 % PSTE Take 1 application by mouth 2 (two) times daily. Do not eat or drink for 30 minutes after    . TANDEM 162-115.2 MG CAPS capsule TAKE 1 CAPSULE BY MOUTH DAILY WITH BREAKFAST. (Patient taking differently: Take 1 capsule by mouth daily with breakfast. ) 90 capsule 3  . traMADol (ULTRAM) 50 MG tablet Take 2  tablets (100 mg total) by mouth every 6 (six) hours as needed for moderate pain. 12 tablet 0    ROS: Constitutional: No fever or chills Vision: No changes in vision ENT: No difficulty swallowing CV: No chest pain Pulm: No SOB or wheezing GI: No nausea or vomiting GU: No urgency or inability to hold urine Skin: No poor wound healing Neurologic: No numbness or tingling Psychiatric: No depression or anxiety Heme: No bruising Allergic: No reaction to medications or food   Exam: Blood pressure 122/68, pulse 98, temperature 98.9 F (37.2 C), temperature source Oral, resp. rate 18, height 6\' 2"  (1.88 m), weight 94.5 kg, SpO2 98 %. General: No acute distress Orientation: Awake alert and oriented x3 Mood and Affect: Cooperative and pleasant Gait: Unable to assess due to his fracture Coordination and balance: Within normal limits  Right lower extremity: Leg is shortened and externally rotated in Buck's traction.  He has pain with any attempted range of motion.  Compartments are soft and compressible.  He has palpable DP pulse.  He endorses sensation albeit diminished to his foot secondary to his neuropathy.  He has brisk cap refill.  Unable to perform instability exam.  Active dorsiflexion plantarflexion of his toes and ankle.  Left lower extremity: Skin without lesions. No tenderness to palpation. Full painless ROM, full strength in each muscle groups without evidence of instability.   Medical Decision Making: Data: Imaging: X-rays of the right femur show a comminuted spiral oblique distal femoral shaft fracture.  Does not appear that there is any intra-articular extension.  Labs:  Results for orders placed or performed during the hospital encounter of 03/07/20 (from the past 24 hour(s))  Comprehensive metabolic panel     Status: Abnormal   Collection Time: 03/07/20  4:43 PM  Result Value Ref Range   Sodium 135 135 - 145 mmol/L   Potassium 4.2 3.5 - 5.1 mmol/L   Chloride 99 98 - 111  mmol/L   CO2 25 22 - 32 mmol/L   Glucose, Bld 121 (H) 70 - 99 mg/dL   BUN 14 8 - 23 mg/dL   Creatinine, Ser 1.28 (H) 0.61 - 1.24 mg/dL   Calcium 8.9 8.9 - 10.3 mg/dL   Total Protein 6.6 6.5 - 8.1 g/dL   Albumin 3.9 3.5 - 5.0 g/dL   AST 24 15 - 41 U/L   ALT 13 0 - 44 U/L   Alkaline Phosphatase 74 38 - 126 U/L   Total Bilirubin 0.4 0.3 - 1.2 mg/dL   GFR, Estimated 57 (L) >60 mL/min   Anion gap 11 5 - 15  CBC with Differential     Status: Abnormal   Collection Time: 03/07/20  4:43 PM  Result Value Ref Range   WBC 7.9 4.0 - 10.5 K/uL   RBC 3.87 (L) 4.22 - 5.81 MIL/uL   Hemoglobin 11.2 (L) 13.0 - 17.0 g/dL   HCT 34.1 (L) 39.0 -  52.0 %   MCV 88.1 80.0 - 100.0 fL   MCH 28.9 26.0 - 34.0 pg   MCHC 32.8 30.0 - 36.0 g/dL   RDW 13.1 11.5 - 15.5 %   Platelets 172 150 - 400 K/uL   nRBC 0.0 0.0 - 0.2 %   Neutrophils Relative % 66 %   Neutro Abs 5.2 1.7 - 7.7 K/uL   Lymphocytes Relative 25 %   Lymphs Abs 2.0 0.7 - 4.0 K/uL   Monocytes Relative 6 %   Monocytes Absolute 0.5 0.1 - 1.0 K/uL   Eosinophils Relative 2 %   Eosinophils Absolute 0.1 0.0 - 0.5 K/uL   Basophils Relative 0 %   Basophils Absolute 0.0 0.0 - 0.1 K/uL   Immature Granulocytes 1 %   Abs Immature Granulocytes 0.07 0.00 - 0.07 K/uL  Protime-INR     Status: None   Collection Time: 03/07/20  4:43 PM  Result Value Ref Range   Prothrombin Time 13.4 11.4 - 15.2 seconds   INR 1.1 0.8 - 1.2  Type and screen [place]     Status: None   Collection Time: 03/07/20  4:56 PM  Result Value Ref Range   ABO/RH(D) A POS    Antibody Screen NEG    Sample Expiration      03/10/2020,2359 Performed at [facility], [place] [address], [place], [place] [address]   Resp Panel by RT-PCR (Flu A&B, Covid) Nasopharyngeal Swab     Status: None   Collection Time: 03/07/20 11:27 PM   Specimen: Nasopharyngeal Swab; Nasopharyngeal(NP) swabs in vial transport medium  Result Value Ref Range   SARS Coronavirus 2 by RT PCR  NEGATIVE NEGATIVE   Influenza A by PCR NEGATIVE NEGATIVE   Influenza B by PCR NEGATIVE NEGATIVE  Surgical pcr screen     Status: None   Collection Time: 03/08/20  3:28 AM   Specimen: Nasal Mucosa; Nasal Swab  Result Value Ref Range   MRSA, PCR NEGATIVE NEGATIVE   Staphylococcus aureus NEGATIVE NEGATIVE  ABO/Rh     Status: None   Collection Time: 03/08/20  3:38 AM  Result Value Ref Range   ABO/RH(D)      A POS Performed at [facility] [address], [place], [place] [address]   CBC     Status: Abnormal   Collection Time: 03/08/20  3:38 AM  Result Value Ref Range   WBC 9.8 4.0 - 10.5 K/uL   RBC 3.34 (L) 4.22 - 5.81 MIL/uL   Hemoglobin 10.0 (L) 13.0 - 17.0 g/dL   HCT 28.4 (L) 39.0 - 52.0 %   MCV 85.0 80.0 - 100.0 fL   MCH 29.9 26.0 - 34.0 pg   MCHC 35.2 30.0 - 36.0 g/dL   RDW 13.2 11.5 - 15.5 %   Platelets 181 150 - 400 K/uL   nRBC 0.0 0.0 - 0.2 %  Basic metabolic panel     Status: Abnormal   Collection Time: 03/08/20  3:38 AM  Result Value Ref Range   Sodium 132 (L) 135 - 145 mmol/L   Potassium 4.1 3.5 - 5.1 mmol/L   Chloride 100 98 - 111 mmol/L   CO2 21 (L) 22 - 32 mmol/L   Glucose, Bld 131 (H) 70 - 99 mg/dL   BUN 14 8 - 23 mg/dL   Creatinine, Ser 1.24 0.61 - 1.24 mg/dL   Calcium 9.0 8.9 - 10.3 mg/dL   GFR, Estimated 60 (L) >60 mL/min  Anion gap 11 5 - 15    Imaging or Labs ordered: None  Medical history and chart was reviewed and case discussed with medical provider.  Assessment/Plan: 80 year old male with a history of congestive heart failure, neuropathy that presents with a right distal femoral shaft fracture.  Due to the unstable nature of his injury I recommend proceeding with a retrograde intramedullary nailing.  Risks and benefits were discussed with the patient.  Risks include but not limited to bleeding, infection, malunion, nonunion, hardware failure, hardware irritation, nerve or blood vessel injury, knee stiffness, DVT, even the possible  anesthetic complications.  The patient agreed to proceed with surgery and consent was obtained.  Shona Needles, MD [facility] [phone] (office) [email or web address]

## 2020-03-08 NOTE — Progress Notes (Signed)
Initial Nutrition Assessment  DOCUMENTATION CODES:   Not applicable  INTERVENTION:   -Ensure Enlive po BID, each supplement provides 350 kcal and 20 grams of protein -MVI with minerals daily  NUTRITION DIAGNOSIS:   Increased nutrient needs related to post-op healing as evidenced by estimated needs.  GOAL:   Patient will meet greater than or equal to 90% of their needs  MONITOR:   PO intake,Supplement acceptance,Labs,Weight trends,Skin,I & O's,Diet advancement  REASON FOR ASSESSMENT:   Consult Assessment of nutrition requirement/status,Hip fracture protocol  ASSESSMENT:   Dustin [name] is a 80 y.o. male with history of chronic anemia, neuropathy, chronic combined systolic and diastolic CHF last EF measured was 40 to 45% with grade 1 diastolic dysfunction in 3267 had a fall at home after slipping on ice.  Did not lose consciousness.  Has significant pain in the right thigh area.  Pt admitted with acute comminuted distal rt femur fracture s/p mechanical fall.   Reviewed I/O's: +237 ml x 24 hours   1/19- s/p Procedures: CPT 27506-Retrograde intramedullary nailing of right femur fracture  Pt unavailable at time of attempted visit. Unable to obtain further nutrition-related history or complete nutriton-focused physical exam at this time.   No meal completion records to assess at this time.  Reviewed wt hx; pt has not experienced at loss over the past 5 months.  Pt with increased nutritional needs for post-op healing and would benefit from addition of oral nutrition supplements.   Medications reviewed and include colace and 0.9% NaCl with KCl 20 mEq/L infusion @ 50 ml/hr.   Labs reviewed: Na: 132.   Diet Order:   Diet Order            Diet Heart Room service appropriate? Yes; Fluid consistency: Thin  Diet effective now                 EDUCATION NEEDS:   No education needs have been identified at this time  Skin:  Skin Assessment: Skin Integrity  Issues: Skin Integrity Issues:: Incisions Incisions: closed rt leg  Last BM:  03/07/20  Height:   Ht Readings from Last 1 Encounters:  03/07/20 6\' 2"  (1.88 m)    Weight:   Wt Readings from Last 1 Encounters:  03/08/20 94.5 kg    Ideal Body Weight:  86.4 kg  BMI:  Body mass index is 26.75 kg/m.  Estimated Nutritional Needs:   Kcal:  2200-2400  Protein:  120-135 grams  Fluid:  > 2 L    Loistine Chance, RD, LDN, [place] Registered Dietitian II Certified Diabetes Care and Education Specialist Please refer to [facility] for RD and/or RD on-call/weekend/after hours pager

## 2020-03-08 NOTE — Anesthesia Preprocedure Evaluation (Addendum)
Anesthesia Evaluation  Patient identified by MRN, date of birth, ID band Patient awake    Reviewed: Allergy & Precautions, NPO status , Patient's Chart, lab work & pertinent test results  Airway Mallampati: IV  TM Distance: >3 FB Neck ROM: Limited    Dental  (+) Dental Advisory Given, Missing, Chipped   Pulmonary former smoker,    Pulmonary exam normal breath sounds clear to auscultation       Cardiovascular +CHF  Normal cardiovascular exam Rhythm:Regular Rate:Normal     Neuro/Psych  Headaches, PSYCHIATRIC DISORDERS Depression  Neuromuscular disease    GI/Hepatic Neg liver ROS, GERD  Medicated,  Endo/Other  negative endocrine ROS  Renal/GU negative Renal ROS     Musculoskeletal right distal femur fracture   Abdominal   Peds  Hematology  (+) Blood dyscrasia, anemia ,   Anesthesia Other Findings Day of surgery medications reviewed with the patient.  Reproductive/Obstetrics                           Anesthesia Physical Anesthesia Plan  ASA: III  Anesthesia Plan: General   Post-op Pain Management:    Induction: Intravenous  PONV Risk Score and Plan: 2 and Dexamethasone and Ondansetron  Airway Management Planned: Oral ETT and Video Laryngoscope Planned  Additional Equipment:   Intra-op Plan:   Post-operative Plan: Extubation in OR  Informed Consent: I have reviewed the patients History and Physical, chart, labs and discussed the procedure including the risks, benefits and alternatives for the proposed anesthesia with the patient or authorized representative who has indicated his/her understanding and acceptance.   Patient has DNR.  Discussed DNR with patient and Suspend DNR.   Dental advisory given  Plan Discussed with: CRNA  Anesthesia Plan Comments:        Anesthesia Quick Evaluation

## 2020-03-08 NOTE — Interval H&P Note (Signed)
History and Physical Interval Note:  03/08/2020 9:27 AM  Dustin [name]  has presented today for surgery, with the diagnosis of right distal femur fracture.  The various methods of treatment have been discussed with the patient and family. After consideration of risks, benefits and other options for treatment, the patient has consented to  Procedure(s): INTRAMEDULLARY (IM) RETROGRADE FEMORAL NAILING (Right) as a surgical intervention.  The patient's history has been reviewed, patient examined, no change in status, stable for surgery.  I have reviewed the patient's chart and labs.  Questions were answered to the patient's satisfaction.     Lennette Bihari P Sonna Lipsky

## 2020-03-08 NOTE — Progress Notes (Signed)
Report received from Floor nurse, Caren Griffins. Pt OTW to surgical short stay.

## 2020-03-08 NOTE — Anesthesia Procedure Notes (Signed)
Procedure Name: Intubation Date/Time: 03/08/2020 9:52 AM Performed by: Moshe Salisbury, CRNA Pre-anesthesia Checklist: Patient identified, Emergency Drugs available, Suction available and Patient being monitored Patient Re-evaluated:Patient Re-evaluated prior to induction Oxygen Delivery Method: Circle System Utilized Preoxygenation: Pre-oxygenation with 100% oxygen Induction Type: IV induction Ventilation: Mask ventilation without difficulty Laryngoscope Size: Glidescope and 4 Tube type: Oral Tube size: 7.5 mm Number of attempts: 1 Airway Equipment and Method: Rigid stylet and Video-laryngoscopy Placement Confirmation: ETT inserted through vocal cords under direct vision,  positive ETCO2 and breath sounds checked- equal and bilateral Secured at: 23 cm Tube secured with: Tape Dental Injury: Teeth and Oropharynx as per pre-operative assessment

## 2020-03-08 NOTE — Anesthesia Postprocedure Evaluation (Signed)
Anesthesia Post Note  Patient: Dustin [name]  Procedure(s) Performed: INTRAMEDULLARY (IM) RETROGRADE FEMORAL NAILING (Right Leg Upper)     Patient location during evaluation: PACU Anesthesia Type: General Level of consciousness: awake and alert Pain management: pain level controlled Vital Signs Assessment: post-procedure vital signs reviewed and stable Respiratory status: spontaneous breathing, nonlabored ventilation, respiratory function stable and patient connected to nasal cannula oxygen Cardiovascular status: blood pressure returned to baseline and stable Postop Assessment: no apparent nausea or vomiting Anesthetic complications: no   No complications documented.  Last Vitals:  Vitals:   03/08/20 1255 03/08/20 1324  BP: 126/75 113/68  Pulse: 77 89  Resp: 12 17  Temp: 36.5 C (!) 36.3 C  SpO2: 100% 100%    Last Pain:  Vitals:   03/08/20 1251  TempSrc:   PainSc: 8         RLE Motor Response: Purposeful movement (03/08/20 1341) RLE Sensation: Full sensation (03/08/20 1341)      Catalina Gravel

## 2020-03-08 NOTE — Progress Notes (Addendum)
PROGRESS NOTE  Dustin [name] [id number] DOB: Oct 01, 1940 DOA: 03/07/2020 PCP: Lujean Amel, MD   LOS: 1 day   Brief narrative:  Dustin [name] is a 80 y.o. male with history of chronic anemia, neuropathy, chronic combined systolic and diastolic CHF, last EF of  40 to 45% with grade 1 diastolic dysfunction in 0000000 presented to the hospital after having a fall after slipping on the ice.  Patient did not lose any consciousness but had significant pain in the right groin area.  In the ED,  X-rays revealed acute comminuted fracture of distal femur.  Labs were significant for anemia.  On-call orthopedic surgeon was consulted and the patient was admitted for further management of the right thigh distal femur fracture.  COVID test was negative.  Assessment/Plan:  Principal Problem:   Femur fracture (HCC) Active Problems:   Peripheral axonal neuropathy   Hip fracture (HCC)  Acute comminuted distal right femur fracture status post mechanical fall.  Orthopedics on board.  Patient was put on hip traction and was admitted to hospital.  Currently n.p.o. for surgery.  Continue analgesia.  Severe pain  Chronic anemia monitor CBC closely.  History of neuropathy on gabapentin.  Planes of neuropathy in his hands today  History of chronic combined systolic and diastolic CHF echocardiogram in 2017 showed l left ventricular ejection fraction of 40 to 45% with grade 1 diastolic dysfunction and 0000000.  Appears euvolemic, not on any medications as per the patient.  Abnormal chest x-ray with question of right apical airspace disease/atelectasis.  No pulmonary symptoms at this time.  Follow-up subsequently at a later date  DVT prophylaxis: SCDs Start: 03/07/20 2222    Code Status: DNR  Family Communication: None today.  Status is: Inpatient  Remains inpatient appropriate because:IV treatments appropriate due to intensity of illness or inability to take PO, Inpatient level of care appropriate due  to severity of illness and right comminuted femur fracture requiring surgery   Dispo: The patient is from: Home              Anticipated d/c is to: Undetermined at this time likely rehabilitation.              Anticipated d/c date is: 2 days              Patient currently is not medically stable to d/c.   Consultants:  Orthopedics  Procedures:  Plan for orthopedic surgery today  Anti-infectives:  Marland Kitchen Preop cefazolin  Anti-infectives (From admission, onward)   Start     Dose/Rate Route Frequency Ordered Stop   03/08/20 1200  ceFAZolin (ANCEF) IVPB 2g/100 mL premix        2 g 200 mL/hr over 30 Minutes Intravenous To Short Stay 03/08/20 0326 03/09/20 1200       Subjective: Today, patient was seen and examined at bedside.  Patient complains of tingling over his hands and severe pain at his thigh.  Objective: Vitals:   03/08/20 0245 03/08/20 0417  BP: 118/70 123/63  Pulse: 91 85  Resp: 16 16  Temp: 98.9 F (37.2 C) 99.2 F (37.3 C)  SpO2: 98% 97%    Intake/Output Summary (Last 24 hours) at 03/08/2020 0717 Last data filed at 03/08/2020 0300 Gross per 24 hour  Intake 237 ml  Output --  Net 237 ml   Filed Weights   03/07/20 1654 03/08/20 0245  Weight: 91.6 kg 94.5 kg   Body mass index is 26.75 kg/m.   Physical Exam:  GENERAL: Patient is alert awake and oriented.  In mild distress due to pain, HENT: No scleral pallor or icterus. Pupils equally reactive to light. Oral mucosa is moist NECK: is supple, no gross swelling noted. CHEST: Clear to auscultation. No crackles or wheezes.  Diminished breath sounds bilaterally. CVS: S1 and S2 heard, no murmur. Regular rate and rhythm.  ABDOMEN: Soft, non-tender, bowel sounds are present. EXTREMITIES: Right distal femur fracture on traction with local tenderness and swelling  CNS: Cranial nerves are intact. No focal motor deficits. SKIN: warm and dry without rashes.  Data Review: I have personally reviewed the following  laboratory data and studies,  CBC: Recent Labs  Lab 03/07/20 1643 03/08/20 0338  WBC 7.9 9.8  NEUTROABS 5.2  --   HGB 11.2* 10.0*  HCT 34.1* 28.4*  MCV 88.1 85.0  PLT 172 092   Basic Metabolic Panel: Recent Labs  Lab 03/07/20 1643 03/08/20 0338  NA 135 132*  K 4.2 4.1  CL 99 100  CO2 25 21*  GLUCOSE 121* 131*  BUN 14 14  CREATININE 1.28* 1.24  CALCIUM 8.9 9.0   Liver Function Tests: Recent Labs  Lab 03/07/20 1643  AST 24  ALT 13  ALKPHOS 74  BILITOT 0.4  PROT 6.6  ALBUMIN 3.9   No results for input(s): LIPASE, AMYLASE in the last 168 hours. No results for input(s): AMMONIA in the last 168 hours. Cardiac Enzymes: No results for input(s): CKTOTAL, CKMB, CKMBINDEX, TROPONINI in the last 168 hours. BNP (last 3 results) No results for input(s): BNP in the last 8760 hours.  ProBNP (last 3 results) No results for input(s): PROBNP in the last 8760 hours.  CBG: No results for input(s): GLUCAP in the last 168 hours. Recent Results (from the past 240 hour(s))  Resp Panel by RT-PCR (Flu A&B, Covid) Nasopharyngeal Swab     Status: None   Collection Time: 03/07/20 11:27 PM   Specimen: Nasopharyngeal Swab; Nasopharyngeal(NP) swabs in vial transport medium  Result Value Ref Range Status   SARS Coronavirus 2 by RT PCR NEGATIVE NEGATIVE Final    Comment: (NOTE) SARS-CoV-2 target nucleic acids are NOT DETECTED.  The SARS-CoV-2 RNA is generally detectable in upper respiratory specimens during the acute phase of infection. The lowest concentration of SARS-CoV-2 viral copies this assay can detect is 138 copies/mL. A negative result does not preclude SARS-Cov-2 infection and should not be used as the sole basis for treatment or other patient management decisions. A negative result may occur with  improper specimen collection/handling, submission of specimen other than nasopharyngeal swab, presence of viral mutation(s) within the areas targeted by this assay, and  inadequate number of viral copies(<138 copies/mL). A negative result must be combined with clinical observations, patient history, and epidemiological information. The expected result is Negative.  Fact Sheet for Patients:  EntrepreneurPulse.com.au  Fact Sheet for Healthcare Providers:  IncredibleEmployment.be  This test is no t yet approved or cleared by the [place] FDA and  has been authorized for detection and/or diagnosis of SARS-CoV-2 by FDA under an Emergency Use Authorization (EUA). This EUA will remain  in effect (meaning this test can be used) for the duration of the COVID-19 declaration under Section 564(b)(1) of the Act, 21 U.S.C.section 360bbb-3(b)(1), unless the authorization is terminated  or revoked sooner.       Influenza A by PCR NEGATIVE NEGATIVE Final   Influenza B by PCR NEGATIVE NEGATIVE Final    Comment: (NOTE) The Xpert Xpress SARS-CoV-2/FLU/RSV plus assay is  intended as an aid in the diagnosis of influenza from Nasopharyngeal swab specimens and should not be used as a sole basis for treatment. Nasal washings and aspirates are unacceptable for Xpert Xpress SARS-CoV-2/FLU/RSV testing.  Fact Sheet for Patients: EntrepreneurPulse.com.au  Fact Sheet for Healthcare Providers: IncredibleEmployment.be  This test is not yet approved or cleared by the [place] FDA and has been authorized for detection and/or diagnosis of SARS-CoV-2 by FDA under an Emergency Use Authorization (EUA). This EUA will remain in effect (meaning this test can be used) for the duration of the COVID-19 declaration under Section 564(b)(1) of the Act, 21 U.S.C. section 360bbb-3(b)(1), unless the authorization is terminated or revoked.  Performed at [facility], [place] [address], [place], [place] [address]   Surgical pcr screen     Status: None   Collection Time: 03/08/20  3:28 AM   Specimen: Nasal  Mucosa; Nasal Swab  Result Value Ref Range Status   MRSA, PCR NEGATIVE NEGATIVE Final   Staphylococcus aureus NEGATIVE NEGATIVE Final    Comment: (NOTE) The Xpert SA Assay (FDA approved for NASAL specimens in patients 38 years of age and older), is one component of a comprehensive surveillance program. It is not intended to diagnose infection nor to guide or monitor treatment. Performed at [facility], [place] [address], [place], [place] [address]      Studies: CT Head Wo Contrast  Result Date: 03/07/2020 CLINICAL DATA:  Head trauma fell on ice EXAM: CT HEAD WITHOUT CONTRAST TECHNIQUE: Contiguous axial images were obtained from the base of the skull through the vertex without intravenous contrast. COMPARISON:  CT brain 02/09/2016 FINDINGS: Brain: No acute territorial infarction, hemorrhage, or intracranial mass. Mild atrophy. Mild hypodensity in the white matter consistent with chronic small vessel ischemic change. Stable ventricle size Vascular: No hyperdense vessels.  No unexpected calcification Skull: Normal. Negative for fracture or focal lesion. Sinuses/Orbits: No acute finding. Other: None IMPRESSION: 1. No CT evidence for acute intracranial abnormality. 2. Atrophy and mild chronic small vessel ischemic changes of the white matter. Electronically Signed   By: Donavan Foil M.D.   On: 03/07/2020 21:13   CT Cervical Spine Wo Contrast  Result Date: 03/07/2020 CLINICAL DATA:  Fall EXAM: CT CERVICAL SPINE WITHOUT CONTRAST TECHNIQUE: Multidetector CT imaging of the cervical spine was performed without intravenous contrast. Multiplanar CT image reconstructions were also generated. COMPARISON:  MRI 01/23/2016, radiograph 10/27/2012 FINDINGS: Alignment: 3 mm anterolisthesis C7 on T1. Facet alignment is maintained Skull base and vertebrae: No acute fracture. No primary bone lesion or focal pathologic process. Soft tissues and spinal canal: No prevertebral fluid or swelling. No visible canal  hematoma. Disc levels: Post fusion changes C3 through C7 with anterior plate, fixating screws and interbody devices. Advanced degenerative change at C7-T1. Facet degeneration at multiple levels. Upper chest: Apical emphysema and scarring. Other: None IMPRESSION: Post fusion changes C3 through C7. No acute osseous abnormality. Stable 3 mm anterolisthesis C7 on T1. Electronically Signed   By: Donavan Foil M.D.   On: 03/07/2020 21:20   DG Pelvis Portable  Result Date: 03/07/2020 CLINICAL DATA:  Fall right leg pain EXAM: PORTABLE PELVIS 1-2 VIEWS COMPARISON:  MRI 12/24/2017 FINDINGS: SI joints are non widened. Pubic symphysis and rami appear intact. No fracture or malalignment. Degenerative changes of the hips. Post treatment changes in the region of the prostate IMPRESSION: No acute osseous abnormality. Electronically Signed   By: Donavan Foil M.D.   On: 03/07/2020 18:25  DG Chest Port 1 View  Result Date: 03/07/2020 CLINICAL DATA:  Fall with right pain EXAM: PORTABLE CHEST 1 VIEW COMPARISON:  10/07/2019, 12/30/2017 FINDINGS: Surgical hardware in the cervical spine. Tubular artifact over the left upper chest. Possible right apical opacity. Stable cardiomediastinal silhouette. No pneumothorax. IMPRESSION: Possible right apical airspace disease, question upper lobe atelectasis. Two-view chest radiographic follow-up suggested. Electronically Signed   By: Donavan Foil M.D.   On: 03/07/2020 18:22   DG Femur Min 2 Views Right  Result Date: 03/07/2020 CLINICAL DATA:  Fall with leg rotation EXAM: RIGHT FEMUR 2 VIEWS COMPARISON:  None. FINDINGS: Acute comminuted fracture involving the distal shaft of the femur with 1 shaft diameter anterior displacement of distal fracture fragment. 5 cm of overriding. IMPRESSION: Acute comminuted displaced and overriding distal femur fracture. Electronically Signed   By: Donavan Foil M.D.   On: 03/07/2020 18:24      Flora Lipps, MD  [facility] 03/08/2020  If  7PM-7AM, please contact night-coverage

## 2020-03-08 NOTE — Transfer of Care (Signed)
Immediate Anesthesia Transfer of Care Note  Patient: Dustin [name]  Procedure(s) Performed: INTRAMEDULLARY (IM) RETROGRADE FEMORAL NAILING (Right Leg Upper)  Patient Location: PACU  Anesthesia Type:General  Level of Consciousness: awake and alert   Airway & Oxygen Therapy: Patient Spontanous Breathing  Post-op Assessment: Report given to RN and Post -op Vital signs reviewed and stable  Post vital signs: Reviewed and stable  Last Vitals:  Vitals Value Taken Time  BP 141/70 03/08/20 1124  Temp    Pulse 94 03/08/20 1126  Resp 18 03/08/20 1126  SpO2 99 % 03/08/20 1126  Vitals shown include unvalidated device data.  Last Pain:  Vitals:   03/08/20 0921  TempSrc:   PainSc: 8       Patients Stated Pain Goal: 4 (45/40/98 1191)  Complications: No complications documented.

## 2020-03-08 NOTE — Op Note (Signed)
Orthopaedic Surgery Operative Note (CSN: [id number] ) Date of Surgery: 03/08/2020  Admit Date: 03/07/2020   Diagnoses: Pre-Op Diagnoses: Right distal femoral shaft fracture   Post-Op Diagnosis: Same  Procedures: CPT 27506-Retrograde intramedullary nailing of right femur fracture  Surgeons : Primary: Shona Needles, MD  Assistant: Patrecia Pace, PA-C  Location: OR 7   Anesthesia:General  Antibiotics: Ancef 2g preop with 1 gm vancomycin powder   Tourniquet time:None  Estimated Blood YSAY:30 mL  Complications:None   Specimens:None   Implants: Implant Name Type Inv. Item Serial No. Manufacturer Lot No. LRB No. Used Action  RFNA/11MM/400MM 5 DEGREE BEND Nail   SYNTHES TRAUMA [id number] Right 1 Implanted  SCREW LOCK IM [id number] - [id number] Screw SCREW LOCK IM 5X86  DEPUY [employer]TH[employer] N/A Right 1 Implanted  5.0 Screw 76MM    Synthes N/A Right 1 Implanted  5.0 Screw 80MM    Synthes N/A Right 1 Implanted  5.0 Screw 38MM    Synthes N/A Right 1 Implanted  5.0 Screw 42MM Screw   Synthes N/A Right 1 Implanted     Indications for Surgery: 80 year old male who slipped and fell on the ice and sustained a right femur fracture.  Due to the unstable nature of his injury I recommend proceeding with retrograde intramedullary nailing.  Risks and benefits were discussed with the patient.  Risks include but not limited to bleeding, infection, malunion, nonunion, hardware failure, hardware irritation, nerve and blood vessel injury knee stiffness, DVT, even the possibility of anesthetic complication.  Patient agreed to proceed with surgery and consent was obtained.  Operative Findings: Retrograde intramedullary nailing of right femoral shaft fracture using Synthes RNFA 11 x 400 mm 5 degree nail with 3 distal interlocks and 2 proximal interlocks.  Procedure: The patient was identified in the preoperative holding area. Consent was confirmed with the patient and their family and all questions were  answered. The operative extremity was marked after confirmation with the patient. he was then brought back to the operating room by our anesthesia colleagues.  He was placed under general anesthetic and carefully transferred over to a radiolucent flat top table.  The right lower extremity was prepped and draped in usual sterile fashion.  A timeout was performed to verify the patient, the procedure, and the extremity.  Preoperative antibiotics were dosed.  Fluoroscopic imaging was obtained to show the unstable nature of his injury.  The hip and knee were flexed over a triangle and reduction maneuvers were performed.  A small medial parapatellar incision was then carried down through skin and subcutaneous tissue.  I entered the knee capsule just medial to the patellar tendon and used a curved Mayo scissors to create an arthrotomy.  I then directed a threaded guidewire at the appropriate starting point on AP and lateral fluoroscopic imaging.  I then advanced it into the metaphysis and a center center direction.  I then used an entry reamer to enter the medullary canal.  A ball-tipped guidewire was passed down the center of the canal into the proximal femur.  I then measured the length of the nail and chose to use a 400 mm nail.  I then sequentially reamed from 8.5 mm to 12.5 mm.  I decided to place an 11 mm nail.  The nail was then passed.  Alignment was appropriate on AP and lateral views of the fracture.  I then used the targeting arm to place 3 distal interlocking screws.  I then undid the targeting arm extended the knee and match  the rotation of the contralateral limb.  Traction was applied and perfect circle technique was used to place proximal interlocking screws from anterior to posterior.  Final fluoroscopic imaging was obtained.  The incisions were copiously irrigated.  A gram of vancomycin powder was placed into the incisions.  Layered closure of 2-0 Vicryl and 3-0 Monocryl with Dermabond was used to  close the incisions.  Sterile dressings were placed.  The patient was then awoken from anesthesia and taken to the PACU in stable condition.   Post Op Plan/Instructions: Patient will be weightbearing as tolerated to the right lower extremity.  He will receive postoperative Ancef.  He will be placed on Lovenox for DVT prophylaxis.  We will have him mobilize with physical and Occupational Therapy postoperatively.  I was present and performed the entire surgery.  Patrecia Pace, PA-C did assist me throughout the case. An assistant was necessary given the difficulty in approach, maintenance of reduction and ability to instrument the fracture.   Katha Hamming, MD [facility]

## 2020-03-08 NOTE — H&P (View-Only) (Signed)
Orthopaedic Trauma Service (OTS) Consult   Patient ID: Dustin [name] MRN: [id number] DOB/AGE: 1940/11/06 80 y.o.  Reason for Consult:Right femur fracture Referring Physician: Dr. Victorino December, MD Dustin [name]  HPI: Dustin [name] is an 80 y.o. male who is being seen in consultation at the request of Dr. Stann Mainland for evaluation of right femur fracture.  The patient was at home where he slipped and fell and landed on his leg.  He had immediate pain in his right thigh.  Was brought in to the emergency room where x-rays showed a right femoral shaft fracture.  He was placed in [place] skeletal traction.  Due to OR availability Dr. Stann Mainland asked that I take over care for the patient.  Patient was seen and evaluated in the preoperative holding area.  Currently complaining of severe pain in his right thigh.  States that he cannot get comfortable.  He has baseline neuropathy but denies any new numbness or tingling.  He lives at home with his wife who he is the primary caretaker for.  He has 3 steps to enter.  His bathroom and bedroom is on the downstairs floor.  Denies any other injuries to his bilateral upper extremities or left lower extremity.  States that he does not use a cane or a walker for ambulation.  His daughter-in-law is home taking care of his wife currently.  Past Medical History:  Diagnosis Date  . BPH (benign prostatic hyperplasia)   . Chronic combined systolic and diastolic congestive heart failure ([place])   . GERD (gastroesophageal reflux disease)    protonix for control  . Insomnia   . Neck pain   . Neuropathy     Past Surgical History:  Procedure Laterality Date  . ANTERIOR CERVICAL DECOMPRESSION/DISCECTOMY FUSION 4 LEVELS N/A 10/27/2012   Procedure: Cervical Three-Four Cervical Four-Five Cervical Five-Six Cervical Six-Seven  Anterior cervical decompression/diskectomy/fusion;  Surgeon: Erline Levine, MD;  Location: [facility];  Service: Neurosurgery;  Laterality: N/A;  Cervical  Three-Four Cervical Four-Five Cervical Five-Six Cervical Six-Seven  Anterior cervical decompression/diskectomy/fusion  . SHOULDER ARTHROSCOPY  2010   rt shoulder  . SHOULDER ARTHROSCOPY  02/15/2011   Procedure: ARTHROSCOPY SHOULDER;  Surgeon: Cynda Familia;  Location: [facility];  Service: Orthopedics;  Laterality: Right;  Debridement  . TONSILLECTOMY      Family History  Problem Relation Age of Onset  . Emphysema Mother   . Cirrhosis Father   . Stroke Brother     Social History:  reports that he quit smoking about 30 years ago. He smoked 1.50 packs per day. He has never used smokeless tobacco. He reports that he does not drink alcohol and does not use drugs.  Allergies: No Known Allergies  Medications:  No current facility-administered medications on file prior to encounter.   Current Outpatient Medications on File Prior to Encounter  Medication Sig Dispense Refill  . Buprenorphine HCl-Naloxone HCl 8-2 MG FILM Place 0.5 strips under the tongue 4 (four) times daily.    Marland Kitchen gabapentin (NEURONTIN) 300 MG capsule Take 1 capsule (300 mg total) by mouth 4 (four) times daily.    . Melatonin 10 MG TABS Take 10 mg by mouth at bedtime as needed.    . Multiple Vitamins-Minerals (MULTIVITAMIN WITH MINERALS) tablet Take 1 tablet by mouth daily.    . pantoprazole (PROTONIX) 40 MG tablet Take 40 mg by mouth daily.      . QUEtiapine (SEROQUEL) 400 MG tablet Take 400 mg by mouth at bedtime.    Marland Kitchen  rOPINIRole (REQUIP) 0.25 MG tablet Take 0.25 mg by mouth every evening.    . silodosin (RAPAFLO) 8 MG CAPS capsule Take 8 mg by mouth daily.    . SODIUM FLUORIDE 5000 PPM 1.1 % PSTE Take 1 application by mouth 2 (two) times daily. Do not eat or drink for 30 minutes after    . TANDEM 162-115.2 MG CAPS capsule TAKE 1 CAPSULE BY MOUTH DAILY WITH BREAKFAST. (Patient taking differently: Take 1 capsule by mouth daily with breakfast. ) 90 capsule 3  . traMADol (ULTRAM) 50 MG tablet Take 2  tablets (100 mg total) by mouth every 6 (six) hours as needed for moderate pain. 12 tablet 0    ROS: Constitutional: No fever or chills Vision: No changes in vision ENT: No difficulty swallowing CV: No chest pain Pulm: No SOB or wheezing GI: No nausea or vomiting GU: No urgency or inability to hold urine Skin: No poor wound healing Neurologic: No numbness or tingling Psychiatric: No depression or anxiety Heme: No bruising Allergic: No reaction to medications or food   Exam: Blood pressure 122/68, pulse 98, temperature 98.9 F (37.2 C), temperature source Oral, resp. rate 18, height 6\' 2"  (1.88 m), weight 94.5 kg, SpO2 98 %. General: No acute distress Orientation: Awake alert and oriented x3 Mood and Affect: Cooperative and pleasant Gait: Unable to assess due to his fracture Coordination and balance: Within normal limits  Right lower extremity: Leg is shortened and externally rotated in Buck's traction.  He has pain with any attempted range of motion.  Compartments are soft and compressible.  He has palpable DP pulse.  He endorses sensation albeit diminished to his foot secondary to his neuropathy.  He has brisk cap refill.  Unable to perform instability exam.  Active dorsiflexion plantarflexion of his toes and ankle.  Left lower extremity: Skin without lesions. No tenderness to palpation. Full painless ROM, full strength in each muscle groups without evidence of instability.   Medical Decision Making: Data: Imaging: X-rays of the right femur show a comminuted spiral oblique distal femoral shaft fracture.  Does not appear that there is any intra-articular extension.  Labs:  Results for orders placed or performed during the hospital encounter of 03/07/20 (from the past 24 hour(s))  Comprehensive metabolic panel     Status: Abnormal   Collection Time: 03/07/20  4:43 PM  Result Value Ref Range   Sodium 135 135 - 145 mmol/L   Potassium 4.2 3.5 - 5.1 mmol/L   Chloride 99 98 - 111  mmol/L   CO2 25 22 - 32 mmol/L   Glucose, Bld 121 (H) 70 - 99 mg/dL   BUN 14 8 - 23 mg/dL   Creatinine, Ser 1.28 (H) 0.61 - 1.24 mg/dL   Calcium 8.9 8.9 - 10.3 mg/dL   Total Protein 6.6 6.5 - 8.1 g/dL   Albumin 3.9 3.5 - 5.0 g/dL   AST 24 15 - 41 U/L   ALT 13 0 - 44 U/L   Alkaline Phosphatase 74 38 - 126 U/L   Total Bilirubin 0.4 0.3 - 1.2 mg/dL   GFR, Estimated 57 (L) >60 mL/min   Anion gap 11 5 - 15  CBC with Differential     Status: Abnormal   Collection Time: 03/07/20  4:43 PM  Result Value Ref Range   WBC 7.9 4.0 - 10.5 K/uL   RBC 3.87 (L) 4.22 - 5.81 MIL/uL   Hemoglobin 11.2 (L) 13.0 - 17.0 g/dL   HCT 34.1 (L) 39.0 -  52.0 %   MCV 88.1 80.0 - 100.0 fL   MCH 28.9 26.0 - 34.0 pg   MCHC 32.8 30.0 - 36.0 g/dL   RDW 13.1 11.5 - 15.5 %   Platelets 172 150 - 400 K/uL   nRBC 0.0 0.0 - 0.2 %   Neutrophils Relative % 66 %   Neutro Abs 5.2 1.7 - 7.7 K/uL   Lymphocytes Relative 25 %   Lymphs Abs 2.0 0.7 - 4.0 K/uL   Monocytes Relative 6 %   Monocytes Absolute 0.5 0.1 - 1.0 K/uL   Eosinophils Relative 2 %   Eosinophils Absolute 0.1 0.0 - 0.5 K/uL   Basophils Relative 0 %   Basophils Absolute 0.0 0.0 - 0.1 K/uL   Immature Granulocytes 1 %   Abs Immature Granulocytes 0.07 0.00 - 0.07 K/uL  Protime-INR     Status: None   Collection Time: 03/07/20  4:43 PM  Result Value Ref Range   Prothrombin Time 13.4 11.4 - 15.2 seconds   INR 1.1 0.8 - 1.2  Type and screen [place]     Status: None   Collection Time: 03/07/20  4:56 PM  Result Value Ref Range   ABO/RH(D) A POS    Antibody Screen NEG    Sample Expiration      03/10/2020,2359 Performed at [facility], [place] [address] [place], [place] [address]   Resp Panel by RT-PCR (Flu A&B, Covid) Nasopharyngeal Swab     Status: None   Collection Time: 03/07/20 11:27 PM   Specimen: Nasopharyngeal Swab; Nasopharyngeal(NP) swabs in vial transport medium  Result Value Ref Range   SARS Coronavirus 2 by RT PCR  NEGATIVE NEGATIVE   Influenza A by PCR NEGATIVE NEGATIVE   Influenza B by PCR NEGATIVE NEGATIVE  Surgical pcr screen     Status: None   Collection Time: 03/08/20  3:28 AM   Specimen: Nasal Mucosa; Nasal Swab  Result Value Ref Range   MRSA, PCR NEGATIVE NEGATIVE   Staphylococcus aureus NEGATIVE NEGATIVE  ABO/Rh     Status: None   Collection Time: 03/08/20  3:38 AM  Result Value Ref Range   ABO/RH(D)      A POS Performed at [facility] [address] [place], [place] [address]   CBC     Status: Abnormal   Collection Time: 03/08/20  3:38 AM  Result Value Ref Range   WBC 9.8 4.0 - 10.5 K/uL   RBC 3.34 (L) 4.22 - 5.81 MIL/uL   Hemoglobin 10.0 (L) 13.0 - 17.0 g/dL   HCT 28.4 (L) 39.0 - 52.0 %   MCV 85.0 80.0 - 100.0 fL   MCH 29.9 26.0 - 34.0 pg   MCHC 35.2 30.0 - 36.0 g/dL   RDW 13.2 11.5 - 15.5 %   Platelets 181 150 - 400 K/uL   nRBC 0.0 0.0 - 0.2 %  Basic metabolic panel     Status: Abnormal   Collection Time: 03/08/20  3:38 AM  Result Value Ref Range   Sodium 132 (L) 135 - 145 mmol/L   Potassium 4.1 3.5 - 5.1 mmol/L   Chloride 100 98 - 111 mmol/L   CO2 21 (L) 22 - 32 mmol/L   Glucose, Bld 131 (H) 70 - 99 mg/dL   BUN 14 8 - 23 mg/dL   Creatinine, Ser 1.24 0.61 - 1.24 mg/dL   Calcium 9.0 8.9 - 10.3 mg/dL   GFR, Estimated 60 (L) >60 mL/min  Anion gap 11 5 - 15    Imaging or Labs ordered: None  Medical history and chart was reviewed and case discussed with medical provider.  Assessment/Plan: 80 year old male with a history of congestive heart failure, neuropathy that presents with a right distal femoral shaft fracture.  Due to the unstable nature of his injury I recommend proceeding with a retrograde intramedullary nailing.  Risks and benefits were discussed with the patient.  Risks include but not limited to bleeding, infection, malunion, nonunion, hardware failure, hardware irritation, nerve or blood vessel injury, knee stiffness, DVT, even the possible  anesthetic complications.  The patient agreed to proceed with surgery and consent was obtained.  Shona Needles, MD [facility] [phone] (office) [email or web address]

## 2020-03-08 NOTE — Progress Notes (Signed)
Report has been called to Altha Harm- the patient is ready for short stay

## 2020-03-09 LAB — BASIC METABOLIC PANEL
Anion gap: 8 (ref 5–15)
BUN: 18 mg/dL (ref 8–23)
CO2: 27 mmol/L (ref 22–32)
Calcium: 8.4 mg/dL — ABNORMAL LOW (ref 8.9–10.3)
Chloride: 98 mmol/L (ref 98–111)
Creatinine, Ser: 1.31 mg/dL — ABNORMAL HIGH (ref 0.61–1.24)
GFR, Estimated: 56 mL/min — ABNORMAL LOW (ref >60)
Glucose, Bld: 138 mg/dL — ABNORMAL HIGH (ref 70–99)
Potassium: 4.5 mmol/L (ref 3.5–5.1)
Sodium: 133 mmol/L — ABNORMAL LOW (ref 135–145)

## 2020-03-09 LAB — CBC
HCT: 24 % — ABNORMAL LOW (ref 39.0–52.0)
Hemoglobin: 8.5 g/dL — ABNORMAL LOW (ref 13.0–17.0)
MCH: 30.2 pg (ref 26.0–34.0)
MCHC: 35.4 g/dL (ref 30.0–36.0)
MCV: 85.4 fL (ref 80.0–100.0)
Platelets: 190 10*3/uL (ref 150–400)
RBC: 2.81 MIL/uL — ABNORMAL LOW (ref 4.22–5.81)
RDW: 13.2 % (ref 11.5–15.5)
WBC: 9.8 10*3/uL (ref 4.0–10.5)
nRBC: 0 % (ref 0.0–0.2)

## 2020-03-09 LAB — VITAMIN D 25 HYDROXY (VIT D DEFICIENCY, FRACTURES): Vit D, 25-Hydroxy: 22.28 ng/mL — ABNORMAL LOW (ref 30–100)

## 2020-03-09 MED ORDER — MORPHINE SULFATE (PF) 2 MG/ML IV SOLN
0.5000 mg | INTRAVENOUS | Status: DC | PRN
  Administered 2020-03-09 – 2020-03-11 (×6): 0.5 mg via INTRAVENOUS
  Filled 2020-03-09 (×6): qty 1

## 2020-03-09 MED ORDER — OXYCODONE HCL 5 MG PO TABS
20.0000 mg | ORAL_TABLET | Freq: Four times a day (QID) | ORAL | Status: DC | PRN
  Administered 2020-03-09 – 2020-03-17 (×25): 20 mg via ORAL
  Filled 2020-03-09 (×26): qty 4

## 2020-03-09 MED ORDER — MULTI-VITAMIN/MINERALS PO TABS: 1.0000 | ORAL_TABLET | Freq: Every day | ORAL | Status: DC

## 2020-03-09 MED ORDER — OXYCODONE-ACETAMINOPHEN 10-325 MG PO TABS: 1.0000 | ORAL_TABLET | Freq: Two times a day (BID) | ORAL | Status: DC | PRN

## 2020-03-09 MED ORDER — OXYCODONE HCL 5 MG PO TABS: 5.0000 mg | ORAL_TABLET | Freq: Two times a day (BID) | ORAL | Status: DC | PRN

## 2020-03-09 MED ORDER — QUETIAPINE FUMARATE 100 MG PO TABS
400.0000 mg | ORAL_TABLET | Freq: Every day | ORAL | Status: DC
  Administered 2020-03-09 – 2020-03-16 (×8): 400 mg via ORAL
  Filled 2020-03-09 (×8): qty 4

## 2020-03-09 MED ORDER — OXYCODONE-ACETAMINOPHEN 5-325 MG PO TABS: 1.0000 | ORAL_TABLET | Freq: Two times a day (BID) | ORAL | Status: DC | PRN

## 2020-03-09 MED ORDER — ROPINIROLE HCL 0.5 MG PO TABS
0.2500 mg | ORAL_TABLET | Freq: Every evening | ORAL | Status: DC
Start: 2020-03-09 — End: 2020-03-17
  Administered 2020-03-09 – 2020-03-17 (×9): 0.25 mg via ORAL
  Filled 2020-03-09 (×9): qty 1

## 2020-03-09 MED ORDER — PANTOPRAZOLE SODIUM 40 MG PO TBEC
40.0000 mg | DELAYED_RELEASE_TABLET | Freq: Every day | ORAL | Status: DC
  Administered 2020-03-09 – 2020-03-17 (×9): 40 mg via ORAL
  Filled 2020-03-09 (×9): qty 1

## 2020-03-09 MED ORDER — MELATONIN 5 MG PO TABS
10.0000 mg | ORAL_TABLET | Freq: Every evening | ORAL | Status: DC | PRN
Start: 2020-03-09 — End: 2020-03-17
  Administered 2020-03-13: 10 mg via ORAL
  Filled 2020-03-09: qty 2

## 2020-03-09 NOTE — Evaluation (Signed)
Occupational Therapy Evaluation Patient Details Name: Dustin [name] MRN: [id number] DOB: 29-Jan-1941 Today's Date: 03/09/2020    History of Present Illness 80 y.o. male with history of chronic anemia, neuropathy, chronic combined systolic and diastolic CHF last EF measured was 40 to 45% presented to ED 03/07/20 after a fall at home after slipping on ice.  Did not lose consciousness. +comminuted fracture of distal right femur. 03/08/20 intramedullary nailing of right femur fracture   Clinical Impression   Pt presents with decline in function and safety with ADLs and ADL mobility with impaired strength, balance and endurance. Pt agreeable to sit EOB but declined OOB activity with OT. PTA, pt was Ind with ADLs/selfcare, home mgt, mobility was driving; lives with his wife. Pt with functional deficits listed below and would benefit from acute OT services to address impairments to maximize level of function and safety    Follow Up Recommendations  CIR (if unable to go to CIR, will need SNF)    Equipment Recommendations  3 in 1 bedside commode (RW, reacher)    Recommendations for Other Services       Precautions / Restrictions Precautions Precautions: Fall Restrictions Weight Bearing Restrictions: Yes RLE Weight Bearing: Weight bearing as tolerated      Mobility Bed Mobility Overal bed mobility: Needs Assistance Bed Mobility: Supine to Sit;Sit to Supine     Supine to sit: Min assist;HOB elevated Sit to supine: Min assist   General bed mobility comments: assist to move RLE, increased time to complete    Transfers                 General transfer comment: refsued standing, per PT note pt required mod A sit - stand    Balance                                           ADL either performed or assessed with clinical judgement   ADL Overall ADL's : Needs assistance/impaired Eating/Feeding: Set up;Independent;Sitting   Grooming: Wash/dry hands;Wash/dry  face;Min guard;Sitting   Upper Body Bathing: Min guard;Sitting   Lower Body Bathing: Maximal assistance   Upper Body Dressing : Min guard;Sitting   Lower Body Dressing: Total assistance     Toilet Transfer Details (indicate cue type and reason): pt declined Toileting- Clothing Manipulation and Hygiene: Sitting/lateral lean;Maximal assistance         General ADL Comments: pt declined OOB activity due to pain and fatigue     Vision Baseline Vision/History: Wears glasses Patient Visual Report: No change from baseline       Perception     Praxis      Pertinent Vitals/Pain Pain Assessment: 0-10 Pain Score: 5  Pain Location: R knee Pain Descriptors / Indicators: Operative site guarding;Grimacing;Guarding;Discomfort Pain Intervention(s): Limited activity within patient's tolerance;Monitored during session;Repositioned;Premedicated before session     Hand Dominance Left   Extremity/Trunk Assessment Upper Extremity Assessment Upper Extremity Assessment: Overall WFL for tasks assessed   Lower Extremity Assessment Lower Extremity Assessment: Defer to PT evaluation   Cervical / Trunk Assessment Cervical / Trunk Assessment: Normal   Communication Communication Communication: HOH   Cognition Arousal/Alertness: Awake/alert Behavior During Therapy: Anxious Overall Cognitive Status: Within Functional Limits for tasks assessed  General Comments: became anxious related to pain and movement, required encouragement for EOB activity, refused transfers   General Comments       Exercises     Shoulder Instructions      Home Living Family/patient expects to be discharged to:: Private residence Living Arrangements: Spouse/significant other;Children Available Help at Discharge: Family;Available 24 hours/day Type of Home: House Home Access: Ramped entrance     Home Layout: Two level;Able to live on main level with  bedroom/bathroom Alternate Level Stairs-Number of Steps: Flight Alternate Level Stairs-Rails: Left Bathroom Shower/Tub: Occupational psychologist: Handicapped height     Home Equipment: Grab bars - tub/shower;Hand held shower head;Shower seat;Wheelchair - manual   Additional Comments: reports son and daughter available to assist with wife      Prior Functioning/Environment Level of Independence: Independent        Comments: Pt reports independent with ADLs, community ambulation without AD, drives, completes yard work. 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 providing meals, grocery shopping and cleaning        OT Problem List: Impaired balance (sitting and/or standing);Decreased activity tolerance;Decreased knowledge of use of DME or AE;Decreased safety awareness      OT Treatment/Interventions: Self-care/ADL training;Patient/family education;Therapeutic activities;DME and/or AE instruction    OT Goals(Current goals can be found in the care plan section) Acute Rehab OT Goals Patient Stated Goal: return home OT Goal Formulation: With patient Time For Goal Achievement: 03/23/20 Potential to Achieve Goals: Good ADL Goals Pt Will Perform Grooming: with supervision;with set-up;sitting Pt Will Perform Upper Body Bathing: with supervision;with set-up;sitting Pt Will Perform Lower Body Bathing: with mod assist;with adaptive equipment Pt Will Perform Upper Body Dressing: with supervision;with set-up;sitting Pt Will Transfer to Toilet: with min assist;ambulating;stand pivot transfer;regular height toilet;bedside commode;grab bars Pt Will Perform Toileting - Clothing Manipulation and hygiene: with mod assist;sit to/from stand  OT Frequency: Min 2X/week   Barriers to D/C:            Co-evaluation              AM-PAC OT "6 Clicks" Daily Activity     Outcome Measure Help from another person eating meals?: None Help from another person taking care of personal  grooming?: A Little Help from another person toileting, which includes using toliet, bedpan, or urinal?: Total Help from another person bathing (including washing, rinsing, drying)?: A Lot Help from another person to put on and taking off regular upper body clothing?: A Little Help from another person to put on and taking off regular lower body clothing?: Total 6 Click Score: 14   End of Session    Activity Tolerance: Patient tolerated treatment well Patient left: in bed;with call bell/phone within reach;with bed alarm set  OT Visit Diagnosis: Other abnormalities of gait and mobility (R26.89);Muscle weakness (generalized) (M62.81);History of falling (Z91.81);Pain Pain - Right/Left: Right Pain - part of body: Leg                Time: 1610-9604 OT Time Calculation (min): 25 min Charges:  OT General Charges $OT Visit: 1 Visit OT Evaluation $OT Eval Moderate Complexity: 1 Mod OT Treatments $Self Care/Home Management : 8-22 mins    Britt Bottom 03/09/2020, 4:16 PM

## 2020-03-09 NOTE — Progress Notes (Signed)
Rehab Admissions Coordinator Note:  Patient was screened by Cleatrice Burke for appropriateness for an Inpatient Acute Rehab Consult per therapy recommendations. It is unlikely that [facility] will approve CIR admit for this diagnosis. Recommend other rehab venue options to be pursued.  Cleatrice Burke RN MSN 03/09/2020, 10:16 AM  I can be reached at [phone].

## 2020-03-09 NOTE — TOC Initial Note (Addendum)
Transition of Care [facility]) - Initial/Assessment Note    Patient Details  Name: Dustin [name] MRN: [id number] Date of Birth: 1940-02-21  [facility] CM/SW Contact:    Sharin Mons, RN Phone Number: 03/09/2020, 2:36 PM  Clinical Narrative:          - s/p IM NAILING of R femur, 1/19             RNCM received consult for possible SNF placement at time of discharge. RNCM spoke with patient regarding discussion had with CIR admission liaison and unlikely chance insurance will approve IR admission. Patient reported that patient's spouse       is currently unable to care for patient at their home given patient's current physical needs and fall risk. Patient expressed understanding and  is agreeable to SNF placement at time of discharge. Patient without preference for SNF. RNCM discussed insurance authorization process and provided Medicare SNF ratings list. Patient expressed being hopeful for rehab and to feel better soon. No further questions reported at this time. RNCM to continue to follow and assist with discharge planning needs.  Pt is fully vaccinated, plus booster.  SNF referrals faxed out.  Expected Discharge Plan: Skilled Nursing Facility Barriers to Discharge: Continued Medical Work up   Patient Goals and CMS Choice     Choice offered to / list presented to : [facility]  Expected Discharge Plan and Services Expected Discharge Plan: [place]   Discharge Planning Services: CM Consult   Living arrangements for the past 2 months: Single Family Home                                      Prior Living Arrangements/Services Living arrangements for the past 2 months: Single Family Home Lives with:: Spouse Patient language and need for interpreter reviewed:: Yes Do you feel safe going back to the place where you live?: Yes      Need for Family Participation in Patient Care: Yes (Comment) Care giver support system in place?: Yes  (comment)   Criminal Activity/Legal Involvement Pertinent to Current Situation/Hospitalization: No - Comment as needed  Activities of Daily Living      Permission Sought/Granted   Permission granted to share information with : Yes, Verbal Permission Granted  Share Information with NAME: Dustin [name] (Spouse) [phone]           Emotional Assessment Appearance:: Appears stated age Attitude/Demeanor/Rapport: Gracious Affect (typically observed): Accepting Orientation: : Oriented to Self,Oriented to Place,Oriented to  Time,Oriented to Situation Alcohol / Substance Use: Not Applicable Psych Involvement: No (comment)  Admission diagnosis:  Femur fracture ([place]) [S72.90XA] Hip fracture ([place]) [S72.009A] Fall [W19.XXXA] Closed displaced oblique fracture of shaft of right femur, initial encounter [facility]) [S72.331A] Patient Active Problem List   Diagnosis Date Noted  . Femur fracture ([place]) 03/07/2020  . Hip fracture ([place]) 03/07/2020  . Cellulitis of right lower extremity   . Acute metabolic encephalopathy   . Unspecified fracture of right foot, initial encounter for closed fracture   . Acute urinary retention   . Idiopathic peripheral neuropathy   . Sepsis due to cellulitis ([place]) 10/07/2019  . New onset headache 01/20/2018  . Muscle weakness (generalized) 11/28/2017  . Weakness 11/26/2017  . Right lumbar radiculopathy 09/18/2016  . Right leg pain 07/22/2016  . Restless leg syndrome 05/28/2016  . Iron deficiency anemia 05/28/2016  . Fall 02/10/2016  . Rib  fracture 02/10/2016  . Acute encephalopathy 02/10/2016  . Chronic combined systolic (congestive) and diastolic (congestive) heart failure ([place])   . Ataxia   . Hyponatremia 01/22/2016  . Gait instability 01/22/2016  . Urinary retention   . Stroke-like symptom   . Syncope 05/02/2015  . Closed left fibular fracture 05/02/2015  . GERD (gastroesophageal reflux disease) 05/02/2015  . BPH (benign prostatic hyperplasia)  05/02/2015  . Fracture of distal end of left fibula   . 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 Depression 01/30/2015  . 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 [place] [address] Phone: [phone] Fax: [phone]     Social Determinants of Health (SDOH) Interventions    Readmission Risk Interventions No flowsheet data found.

## 2020-03-09 NOTE — Progress Notes (Signed)
PROGRESS NOTE  Dustin [name] [id number] DOB: December 12, 1940 DOA: 03/07/2020 PCP: Lujean Amel, MD   LOS: 2 days   Brief narrative:  Dustin [name] is a 80 y.o. male with history of chronic anemia, neuropathy, chronic combined systolic and diastolic CHF, last EF of  40 to 45% with grade 1 diastolic dysfunction in 0000000 presented to the hospital after having a fall after slipping on the ice.  Patient did not lose any consciousness but had significant pain in the right groin area.  In the ED,  X-rays revealed acute comminuted fracture of right distal femur.  Labs were significant for anemia.  On-call orthopedic surgeon was consulted and the patient was admitted for further management of the right thigh distal femur fracture.  COVID test was negative.  Assessment/Plan:  Principal Problem:   Femur fracture (HCC) Active Problems:   Peripheral axonal neuropathy   Hip fracture (HCC)  Acute comminuted distal right femur fracture status post mechanical fall.  Orthopedics on board.  Patient was initially put on hip traction and subsequently underwent of the right femur on 03/08/2020.  Continue supportive care.  PT OT evaluation.  Chronic anemia closely postoperatively.  Hemoglobin of 8.5 at this time.  Was 10.1 yesterday.  Likely mild postoperative anemia.  No need for transfusion at this time  Mild leukocytosis likely reactive in nature.  This has improved.  History of neuropathy on gabapentin.  Will continue.  History of chronic combined systolic and diastolic CHF echocardiogram in 2017 showed l left ventricular ejection fraction of 40 to 45% with grade 1 diastolic dysfunction and 0000000.  Compensated at this time.  We will watch for fluid overload  Abnormal chest x-ray with question of right apical airspace disease/atelectasis.  No pulmonary symptoms.  Will need outpatient follow-up.  Mild hyponatremia.  We will continue to monitor closely.  DVT prophylaxis: enoxaparin (LOVENOX) injection  40 mg Start: 03/09/20 0800 SCDs Start: 03/08/20 1321 SCDs Start: 03/07/20 2222   Code Status: DNR  Family Communication: I spoke with the patient's spouse on the phone and updated her about the clinical condition of the patient.  Status is: Inpatient  Remains inpatient appropriate because:IV treatments appropriate due to intensity of illness or inability to take PO, Inpatient level of care appropriate due to severity of illness and status post comminuted femur fracture status post surgery   Dispo: The patient is from: Home              Anticipated d/c is to: Seen by physical therapy today     recommending CIR.  Consult PMR.     Anticipated d/c date is: 2 days              Patient currently is not medically stable to d/c.   Consultants:  Orthopedics  Procedures:  IM nailing of right distal femoral fracture on 03/08/2020  Anti-infectives:  Marland Kitchen Preop cefazolin  Anti-infectives (From admission, onward)   Start     Dose/Rate Route Frequency Ordered Stop   03/08/20 1800  ceFAZolin (ANCEF) IVPB 2g/100 mL premix        2 g 200 mL/hr over 30 Minutes Intravenous Every 8 hours 03/08/20 1321 03/09/20 1759   03/08/20 1200  ceFAZolin (ANCEF) IVPB 2g/100 mL premix        2 g 200 mL/hr over 30 Minutes Intravenous To Short Stay 03/08/20 0326 03/08/20 0942   03/08/20 1027  vancomycin (VANCOCIN) powder  Status:  Discontinued  As needed 03/08/20 1027 03/08/20 1120     Subjective: Today, patient was seen and examined at bedside.  Complains of mild pain at the operative site.  Denies any shortness of breath, cough, fever or chills  Objective: Vitals:   03/08/20 1954 03/09/20 0500  BP: 127/87 125/82  Pulse: 88 85  Resp: 18 17  Temp: 97.6 F (36.4 C) 98 F (36.7 C)  SpO2: 97% 99%    Intake/Output Summary (Last 24 hours) at 03/09/2020 0723 Last data filed at 03/09/2020 0500 Gross per 24 hour  Intake 1150 ml  Output 550 ml  Net 600 ml   Filed Weights   03/07/20 1654  03/08/20 0245  Weight: 91.6 kg 94.5 kg   Body mass index is 26.75 kg/m.   Physical Exam: GENERAL: Patient is alert awake and oriented, not in obvious distress HENT: No scleral pallor or icterus. Pupils equally reactive to light. Oral mucosa is moist NECK: is supple, no gross swelling noted. CHEST: Clear to auscultation. No crackles or wheezes.  Diminished breath sounds bilaterally. CVS: S1 and S2 heard, no murmur. Regular rate and rhythm.  ABDOMEN: Soft, non-tender, bowel sounds are present. EXTREMITIES: Right lower extremity on the compression bandage.  Capillary refill present. CNS: Cranial nerves are intact. No focal motor deficits. SKIN: warm and dry without rashes.  Right femur surgery   Data Review: I have personally reviewed the following laboratory data and studies,  CBC: Recent Labs  Lab 03/07/20 1643 03/08/20 0338 03/08/20 1402 03/09/20 0222  WBC 7.9 9.8 16.2* 9.8  NEUTROABS 5.2  --   --   --   HGB 11.2* 10.0* 10.1* 8.5*  HCT 34.1* 28.4* 28.7* 24.0*  MCV 88.1 85.0 86.4 85.4  PLT 172 181 214 616   Basic Metabolic Panel: Recent Labs  Lab 03/07/20 1643 03/08/20 0338 03/08/20 1402 03/09/20 0222  NA 135 132*  --  133*  K 4.2 4.1  --  4.5  CL 99 100  --  98  CO2 25 21*  --  27  GLUCOSE 121* 131*  --  138*  BUN 14 14  --  18  CREATININE 1.28* 1.24 1.38* 1.31*  CALCIUM 8.9 9.0  --  8.4*   Liver Function Tests: Recent Labs  Lab 03/07/20 1643  AST 24  ALT 13  ALKPHOS 74  BILITOT 0.4  PROT 6.6  ALBUMIN 3.9   No results for input(s): LIPASE, AMYLASE in the last 168 hours. No results for input(s): AMMONIA in the last 168 hours. Cardiac Enzymes: No results for input(s): CKTOTAL, CKMB, CKMBINDEX, TROPONINI in the last 168 hours. BNP (last 3 results) No results for input(s): BNP in the last 8760 hours.  ProBNP (last 3 results) No results for input(s): PROBNP in the last 8760 hours.  CBG: No results for input(s): GLUCAP in the last 168 hours. Recent  Results (from the past 240 hour(s))  Resp Panel by RT-PCR (Flu A&B, Covid) Nasopharyngeal Swab     Status: None   Collection Time: 03/07/20 11:27 PM   Specimen: Nasopharyngeal Swab; Nasopharyngeal(NP) swabs in vial transport medium  Result Value Ref Range Status   SARS Coronavirus 2 by RT PCR NEGATIVE NEGATIVE Final    Comment: (NOTE) SARS-CoV-2 target nucleic acids are NOT DETECTED.  The SARS-CoV-2 RNA is generally detectable in upper respiratory specimens during the acute phase of infection. The lowest concentration of SARS-CoV-2 viral copies this assay can detect is 138 copies/mL. A negative result does not preclude SARS-Cov-2 infection and should  not be used as the sole basis for treatment or other patient management decisions. A negative result may occur with  improper specimen collection/handling, submission of specimen other than nasopharyngeal swab, presence of viral mutation(s) within the areas targeted by this assay, and inadequate number of viral copies(<138 copies/mL). A negative result must be combined with clinical observations, patient history, and epidemiological information. The expected result is Negative.  Fact Sheet for Patients:  EntrepreneurPulse.com.au  Fact Sheet for Healthcare Providers:  IncredibleEmployment.be  This test is no t yet approved or cleared by the [place] FDA and  has been authorized for detection and/or diagnosis of SARS-CoV-2 by FDA under an Emergency Use Authorization (EUA). This EUA will remain  in effect (meaning this test can be used) for the duration of the COVID-19 declaration under Section 564(b)(1) of the Act, 21 U.S.C.section 360bbb-3(b)(1), unless the authorization is terminated  or revoked sooner.       Influenza A by PCR NEGATIVE NEGATIVE Final   Influenza B by PCR NEGATIVE NEGATIVE Final    Comment: (NOTE) The Xpert Xpress SARS-CoV-2/FLU/RSV plus assay is intended as an aid in the  diagnosis of influenza from Nasopharyngeal swab specimens and should not be used as a sole basis for treatment. Nasal washings and aspirates are unacceptable for Xpert Xpress SARS-CoV-2/FLU/RSV testing.  Fact Sheet for Patients: EntrepreneurPulse.com.au  Fact Sheet for Healthcare Providers: IncredibleEmployment.be  This test is not yet approved or cleared by the [place] FDA and has been authorized for detection and/or diagnosis of SARS-CoV-2 by FDA under an Emergency Use Authorization (EUA). This EUA will remain in effect (meaning this test can be used) for the duration of the COVID-19 declaration under Section 564(b)(1) of the Act, 21 U.S.C. section 360bbb-3(b)(1), unless the authorization is terminated or revoked.  Performed at [facility], [place] [address], [place], [place] [address]   Surgical pcr screen     Status: None   Collection Time: 03/08/20  3:28 AM   Specimen: Nasal Mucosa; Nasal Swab  Result Value Ref Range Status   MRSA, PCR NEGATIVE NEGATIVE Final   Staphylococcus aureus NEGATIVE NEGATIVE Final    Comment: (NOTE) The Xpert SA Assay (FDA approved for NASAL specimens in patients 73 years of age and older), is one component of a comprehensive surveillance program. It is not intended to diagnose infection nor to guide or monitor treatment. Performed at [facility], [place] [address] [place],  [address]      Studies: CT Head Wo Contrast  Result Date: 03/07/2020 CLINICAL DATA:  Head trauma fell on ice EXAM: CT HEAD WITHOUT CONTRAST TECHNIQUE: Contiguous axial images were obtained from the base of the skull through the vertex without intravenous contrast. COMPARISON:  CT brain 02/09/2016 FINDINGS: Brain: No acute territorial infarction, hemorrhage, or intracranial mass. Mild atrophy. Mild hypodensity in the white matter consistent with chronic small vessel ischemic change. Stable ventricle size Vascular: No  hyperdense vessels.  No unexpected calcification Skull: Normal. Negative for fracture or focal lesion. Sinuses/Orbits: No acute finding. Other: None IMPRESSION: 1. No CT evidence for acute intracranial abnormality. 2. Atrophy and mild chronic small vessel ischemic changes of the white matter. Electronically Signed   By: Donavan Foil M.D.   On: 03/07/2020 21:13   CT Cervical Spine Wo Contrast  Result Date: 03/07/2020 CLINICAL DATA:  Fall EXAM: CT CERVICAL SPINE WITHOUT CONTRAST TECHNIQUE: Multidetector CT imaging of the cervical spine was performed without intravenous contrast. Multiplanar CT image reconstructions were also generated.  COMPARISON:  MRI 01/23/2016, radiograph 10/27/2012 FINDINGS: Alignment: 3 mm anterolisthesis C7 on T1. Facet alignment is maintained Skull base and vertebrae: No acute fracture. No primary bone lesion or focal pathologic process. Soft tissues and spinal canal: No prevertebral fluid or swelling. No visible canal hematoma. Disc levels: Post fusion changes C3 through C7 with anterior plate, fixating screws and interbody devices. Advanced degenerative change at C7-T1. Facet degeneration at multiple levels. Upper chest: Apical emphysema and scarring. Other: None IMPRESSION: Post fusion changes C3 through C7. No acute osseous abnormality. Stable 3 mm anterolisthesis C7 on T1. Electronically Signed   By: Donavan Foil M.D.   On: 03/07/2020 21:20   DG Pelvis Portable  Result Date: 03/07/2020 CLINICAL DATA:  Fall right leg pain EXAM: PORTABLE PELVIS 1-2 VIEWS COMPARISON:  MRI 12/24/2017 FINDINGS: SI joints are non widened. Pubic symphysis and rami appear intact. No fracture or malalignment. Degenerative changes of the hips. Post treatment changes in the region of the prostate IMPRESSION: No acute osseous abnormality. Electronically Signed   By: Donavan Foil M.D.   On: 03/07/2020 18:25   DG Chest Port 1 View  Result Date: 03/07/2020 CLINICAL DATA:  Fall with right pain EXAM:  PORTABLE CHEST 1 VIEW COMPARISON:  10/07/2019, 12/30/2017 FINDINGS: Surgical hardware in the cervical spine. Tubular artifact over the left upper chest. Possible right apical opacity. Stable cardiomediastinal silhouette. No pneumothorax. IMPRESSION: Possible right apical airspace disease, question upper lobe atelectasis. Two-view chest radiographic follow-up suggested. Electronically Signed   By: Donavan Foil M.D.   On: 03/07/2020 18:22   DG C-Arm 1-60 Min  Result Date: 03/08/2020 CLINICAL DATA:  Retrograde IM nail of right femur. EXAM: DG C-ARM 1-60 MIN; RIGHT FEMUR 2 VIEWS FLUOROSCOPY TIME:  Fluoroscopy Time:  2 minutes and 12 seconds. Port radiation: 17.36 mGy. COMPARISON:  Radiographs 03/07/2020 FINDINGS: Eight C-arm fluoroscopic images were obtained intraoperatively and submitted for post operative interpretation. These images demonstrate surgical changes of intramedullary nail and screw fixation of a distal femur fracture with improved alignment. Multiple mildly displaced butterfly fragments are noted. No unexpected findings. Please see the performing provider's procedural report for further detail. IMPRESSION: Intraoperative fluoroscopic imaging, as detailed above. Electronically Signed   By: Margaretha Sheffield MD   On: 03/08/2020 11:35   DG FEMUR, MIN 2 VIEWS RIGHT  Result Date: 03/08/2020 CLINICAL DATA:  Retrograde IM nail of right femur. EXAM: DG C-ARM 1-60 MIN; RIGHT FEMUR 2 VIEWS FLUOROSCOPY TIME:  Fluoroscopy Time:  2 minutes and 12 seconds. Port radiation: 17.36 mGy. COMPARISON:  Radiographs 03/07/2020 FINDINGS: Eight C-arm fluoroscopic images were obtained intraoperatively and submitted for post operative interpretation. These images demonstrate surgical changes of intramedullary nail and screw fixation of a distal femur fracture with improved alignment. Multiple mildly displaced butterfly fragments are noted. No unexpected findings. Please see the performing provider's procedural report for  further detail. IMPRESSION: Intraoperative fluoroscopic imaging, as detailed above. Electronically Signed   By: Margaretha Sheffield MD   On: 03/08/2020 11:35   DG Femur Min 2 Views Right  Result Date: 03/07/2020 CLINICAL DATA:  Fall with leg rotation EXAM: RIGHT FEMUR 2 VIEWS COMPARISON:  None. FINDINGS: Acute comminuted fracture involving the distal shaft of the femur with 1 shaft diameter anterior displacement of distal fracture fragment. 5 cm of overriding. IMPRESSION: Acute comminuted displaced and overriding distal femur fracture. Electronically Signed   By: Donavan Foil M.D.   On: 03/07/2020 18:24   DG FEMUR PORT, MIN 2 VIEWS RIGHT  Result Date: 03/08/2020  CLINICAL DATA:  ORIF of the distal femur EXAM: RIGHT FEMUR PORTABLE 2 VIEW COMPARISON:  None. FINDINGS: Comminuted distal femoral diaphysis extending into the metaphysis fracture transfixed with a intramedullary nail and interlocking screws. Alignment is near anatomic. Posteriorly displaced 8 x 35 mm fracture fragment. No other acute fracture or dislocation. IMPRESSION: 1. Interval ORIF of a comminuted distal femoral fracture in near-anatomic alignment. Electronically Signed   By: Kathreen Devoid   On: 03/08/2020 15:22      Flora Lipps, MD  [facility] 03/09/2020  If 7PM-7AM, please contact night-coverage

## 2020-03-09 NOTE — Plan of Care (Signed)

## 2020-03-09 NOTE — Progress Notes (Signed)
Orthopaedic Trauma Progress Note  SUBJECTIVE: Reports moderate/severe pain about operative site, worst in the knee. No chest pain. No SOB. No nausea/vomiting.  Patient worked with physical therapy this morning but was unable to stand on right leg secondary to pain.  He does note that he follows with outpatient pain management and prior to admission was on oxycodone 20 mg 4 times daily as well as Percocet 10-325 mg twice daily as needed.  We will plan to restart these today.  Patient notes that he has a caregiver of his wife at home.  He prefers to return home at discharge and states his kids will be able to help.  OBJECTIVE:  Vitals:   03/09/20 0500 03/09/20 1107  BP: 125/82 106/66  Pulse: 85 99  Resp: 17 18  Temp: 98 F (36.7 C) 98.1 F (36.7 C)  SpO2: 99% 99%    General: Sitting up in bed, no acute distress but appears in pain Respiratory: No increased work of breathing.  Right lower extremity: Dressing clean, dry, intact.  Tenderness with palpation over the knee and throughout the thigh.  Tolerates minimal motion of the knee or hip secondary to pain.  Ankle dorsiflexion/plantarflexion is intact.  Patient with neuropathy at baseline so minimal sensation to light touch distally.+ DP pulse  IMAGING: Stable post op imaging.   LABS:  Results for orders placed or performed during the hospital encounter of 03/07/20 (from the past 24 hour(s))  CBC     Status: Abnormal   Collection Time: 03/08/20  2:02 PM  Result Value Ref Range   WBC 16.2 (H) 4.0 - 10.5 K/uL   RBC 3.32 (L) 4.22 - 5.81 MIL/uL   Hemoglobin 10.1 (L) 13.0 - 17.0 g/dL   HCT 28.7 (L) 39.0 - 52.0 %   MCV 86.4 80.0 - 100.0 fL   MCH 30.4 26.0 - 34.0 pg   MCHC 35.2 30.0 - 36.0 g/dL   RDW 13.2 11.5 - 15.5 %   Platelets 214 150 - 400 K/uL   nRBC 0.0 0.0 - 0.2 %  Creatinine, serum     Status: Abnormal   Collection Time: 03/08/20  2:02 PM  Result Value Ref Range   Creatinine, Ser 1.38 (H) 0.61 - 1.24 mg/dL   GFR, Estimated 52  (L) >60 mL/min  Basic metabolic panel     Status: Abnormal   Collection Time: 03/09/20  2:22 AM  Result Value Ref Range   Sodium 133 (L) 135 - 145 mmol/L   Potassium 4.5 3.5 - 5.1 mmol/L   Chloride 98 98 - 111 mmol/L   CO2 27 22 - 32 mmol/L   Glucose, Bld 138 (H) 70 - 99 mg/dL   BUN 18 8 - 23 mg/dL   Creatinine, Ser 1.31 (H) 0.61 - 1.24 mg/dL   Calcium 8.4 (L) 8.9 - 10.3 mg/dL   GFR, Estimated 56 (L) >60 mL/min   Anion gap 8 5 - 15  CBC     Status: Abnormal   Collection Time: 03/09/20  2:22 AM  Result Value Ref Range   WBC 9.8 4.0 - 10.5 K/uL   RBC 2.81 (L) 4.22 - 5.81 MIL/uL   Hemoglobin 8.5 (L) 13.0 - 17.0 g/dL   HCT 24.0 (L) 39.0 - 52.0 %   MCV 85.4 80.0 - 100.0 fL   MCH 30.2 26.0 - 34.0 pg   MCHC 35.4 30.0 - 36.0 g/dL   RDW 13.2 11.5 - 15.5 %   Platelets 190 150 - 400 K/uL  nRBC 0.0 0.0 - 0.2 %    ASSESSMENT: KERRICK MILER is a 80 y.o. male, 1 Day Post-Op s/p RETROGRADE INTRAMEDULLARY NAILING RIGHT FEMUR   CV/Blood loss: Acute blood loss anemia, Hgb 8.5 this morning. Hemodynamically stable  PLAN: Weightbearing: WBAT RLE Incisional and dressing care: Plan to remove dressings tomorrow and leave incisions open to air Showering: Okay to begin showering 03/11/2020 Orthopedic device(s): None  Pain management:  1. Tylenol 650 mg q 6 hours scheduled 2. Robaxin 500 mg q 6 hours PRN 3. Oxycodone 5-10 mg q 4 hours PRN 4. Neurontin 100 mg TID 5. Dilaudid 1 mg q 3 hours PRN VTE prophylaxis: Lovenox, SCDs ID:  Ancef 2gm post op Foley/Lines:  No foley, KVO IVFs Impediments to Fracture Healing: Vitamin D level pending, will start supplementation as indicated Dispo: PT/OT evaluation today, dispo pending.  Patient would prefer to go home with home health therapies.  Does not want SNF.   Follow - up plan: 2 weeks  Contact information:  Katha Hamming MD, Patrecia Pace PA-C. After hours and holidays please check [email or web address] for group call information for [facility]   Moroni Nester  A. Ricci Barker, PA-C [phone] (office) [email or web address]

## 2020-03-09 NOTE — Evaluation (Signed)
Physical Therapy Evaluation Patient Details Name: Dustin [name] MRN: [id number] DOB: Feb 22, 1940 Today's Date: 03/09/2020   History of Present Illness  80 y.o. male with history of chronic anemia, neuropathy, chronic combined systolic and diastolic CHF last EF measured was 40 to 45% presented to ED 03/07/20 after a fall at home after slipping on ice.  Did not lose consciousness. +comminuted fracture of distal right femur. 03/08/20 intramedullary nailing of right femur fracture  Clinical Impression   Patient is s/p above surgery resulting in functional limitations due to the deficits listed below (see PT Problem List). Patient pre-medicated for pain and immediately asking to use the bathroom on arrival. Ultimately, pt unable to stand from elevated bed with mod assist due to pain (as soon as he would get hands on the walker in partial standing, he would return to sitting due to pain). Ultimately returned to supine to use bedpan. With better pain control, anticipate he could make good progress, however currently would need additional therapies prior to return home.  Patient will benefit from skilled PT to increase their independence and safety with mobility to allow discharge to the venue listed below.       Follow Up Recommendations CIR    Equipment Recommendations  Rolling walker with 5" wheels;3in1 (PT)    Recommendations for Other Services Rehab consult;OT consult     Precautions / Restrictions Precautions Precautions: Fall Restrictions RLE Weight Bearing: Weight bearing as tolerated      Mobility  Bed Mobility Overal bed mobility: Needs Assistance Bed Mobility: Supine to Sit;Sit to Supine     Supine to sit: Min assist;HOB elevated Sit to supine: Min assist   General bed mobility comments: assist to move RLE    Transfers Overall transfer level: Needs assistance Equipment used: Rolling walker (2 wheeled) Transfers: Sit to/from Stand Sit to Stand: Mod assist;From elevated  surface (bed elevated to simulate home)         General transfer comment: Never achieved full standing. Was able to transition hands onto RW, however could not tolerate pain to fully stand uprigh and returned to sitting each time after <5 seconds  Ambulation/Gait                Stairs            Wheelchair Mobility    Modified Rankin (Stroke Patients Only)       Balance Overall balance assessment: Needs assistance         Standing balance support: Bilateral upper extremity supported Standing balance-Leahy Scale: Poor                               Pertinent Vitals/Pain Pain Assessment: 0-10 Pain Score: 6  Pain Location: rt knee Pain Descriptors / Indicators: Operative site guarding;Grimacing;Guarding;Discomfort Pain Intervention(s): Limited activity within patient's tolerance;Monitored during session;Premedicated before session;Repositioned    Home Living Family/patient expects to be discharged to:: Private residence Living Arrangements: Spouse/significant other;Children Available Help at Discharge: Family;Available 24 hours/day (Son and DIL have come to stay with spouse) Type of Home: House Home Access: Ramped entrance     Home Layout: Two level;Able to live on main level with bedroom/bathroom Home Equipment: Grab bars - tub/shower;Hand held shower head;Shower seat;Wheelchair - manual (equipment his wife uses that they can share)      Prior Function Level of Independence: Independent         Comments: Pt reports independent with ADLs, community ambulation  without AD, drives, completes yard work. 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 providing meals, grocery shopping and cleaning     Hand Dominance   Dominant Hand: Left    Extremity/Trunk Assessment   Upper Extremity Assessment Upper Extremity Assessment: Defer to OT evaluation    Lower Extremity Assessment Lower Extremity Assessment: RLE deficits/detail;LLE  deficits/detail RLE Deficits / Details: tolerated knee flxion to 90; unable to lift leg off bed RLE Sensation: history of peripheral neuropathy LLE Deficits / Details: AROM, strength WFL LLE Sensation: history of peripheral neuropathy    Cervical / Trunk Assessment Cervical / Trunk Assessment: Normal  Communication   Communication: HOH  Cognition Arousal/Alertness: Awake/alert Behavior During Therapy: Anxious Overall Cognitive Status: Within Functional Limits for tasks assessed                                 General Comments: became anxious related to pain and movement      General Comments      Exercises General Exercises - Lower Extremity Ankle Circles/Pumps: AROM;Right;5 reps (including wiggling toes) Heel Slides: Other (comment) (educated to complete; pt on bedpan at end of session)   Assessment/Plan    PT Assessment Patient needs continued PT services  PT Problem List Decreased range of motion;Decreased activity tolerance;Decreased balance;Decreased mobility;Decreased knowledge of use of DME;Decreased safety awareness;Decreased knowledge of precautions;Impaired sensation;Pain       PT Treatment Interventions DME instruction;Gait training;Functional mobility training;Therapeutic activities;Therapeutic exercise;Patient/family education    PT Goals (Current goals can be found in the Care Plan section)  Acute Rehab PT Goals Patient Stated Goal: return home PT Goal Formulation: With patient Time For Goal Achievement: 03/23/20 Potential to Achieve Goals: Good    Frequency Min 5X/week   Barriers to discharge        Co-evaluation               AM-PAC PT "6 Clicks" Mobility  Outcome Measure Help needed turning from your back to your side while in a flat bed without using bedrails?: A Lot Help needed moving from lying on your back to sitting on the side of a flat bed without using bedrails?: A Lot Help needed moving to and from a bed to a chair  (including a wheelchair)?: Total Help needed standing up from a chair using your arms (e.g., wheelchair or bedside chair)?: Total Help needed to walk in hospital room?: Total Help needed climbing 3-5 steps with a railing? : Total 6 Click Score: 8    End of Session Equipment Utilized During Treatment: Gait belt Activity Tolerance: Patient limited by pain Patient left: in bed;with call bell/phone within reach;with bed alarm set;Other (comment) (NT in and assisted onto bedpan) Nurse Communication: Mobility status PT Visit Diagnosis: Other abnormalities of gait and mobility (R26.89);History of falling (Z91.81);Pain Pain - Right/Left: Right Pain - part of body: Knee    Time: 8657-8469 PT Time Calculation (min) (ACUTE ONLY): 22 min   Charges:   PT Evaluation $PT Eval Moderate Complexity: 1 Mod           Arby Barrette, PT Pager [phone]   Rexanne Mano 03/09/2020, 9:37 AM

## 2020-03-09 NOTE — Progress Notes (Signed)
Inpatient Rehab Admissions Coordinator:   I met with Pt. To discuss potential CIR admission. It is unlikely that [facility] will approve CIR admit for this diagnosis and Pt. States that he wants to go home with home health. He states that his daughter in law does not work and is currently caring for Pt.'s wife He states that she is able to be home with him and provide some assistance at d/c. CIR will sign off at this time.   Clemens Catholic, [place], [employer] Admissions Coordinator  [phone] ([place]) [phone] (office)

## 2020-03-10 LAB — CBC
HCT: 21.6 % — ABNORMAL LOW (ref 39.0–52.0)
Hemoglobin: 7.5 g/dL — ABNORMAL LOW (ref 13.0–17.0)
MCH: 29.9 pg (ref 26.0–34.0)
MCHC: 34.7 g/dL (ref 30.0–36.0)
MCV: 86.1 fL (ref 80.0–100.0)
Platelets: 144 K/uL — ABNORMAL LOW (ref 150–400)
RBC: 2.51 MIL/uL — ABNORMAL LOW (ref 4.22–5.81)
RDW: 13.2 % (ref 11.5–15.5)
WBC: 8.4 K/uL (ref 4.0–10.5)
nRBC: 0 % (ref 0.0–0.2)

## 2020-03-10 LAB — BASIC METABOLIC PANEL WITH GFR
Anion gap: 8 (ref 5–15)
BUN: 19 mg/dL (ref 8–23)
CO2: 25 mmol/L (ref 22–32)
Calcium: 8.4 mg/dL — ABNORMAL LOW (ref 8.9–10.3)
Chloride: 97 mmol/L — ABNORMAL LOW (ref 98–111)
Creatinine, Ser: 1.16 mg/dL (ref 0.61–1.24)
GFR, Estimated: >60 mL/min (ref >60)
Glucose, Bld: 124 mg/dL — ABNORMAL HIGH (ref 70–99)
Potassium: 4.3 mmol/L (ref 3.5–5.1)
Sodium: 130 mmol/L — ABNORMAL LOW (ref 135–145)

## 2020-03-10 MED ORDER — VITAMIN D3 25 MCG PO TABS: 2000.0000 [IU] | ORAL_TABLET | Freq: Every day | ORAL | 0 refills | Status: DC

## 2020-03-10 MED ORDER — CHLORHEXIDINE GLUCONATE CLOTH 2 % EX PADS
6.0000 | MEDICATED_PAD | Freq: Every day | CUTANEOUS | Status: DC
  Administered 2020-03-10 – 2020-03-17 (×8): 6 via TOPICAL

## 2020-03-10 MED ORDER — VITAMIN D 25 MCG (1000 UNIT) PO TABS
2000.0000 [IU] | ORAL_TABLET | Freq: Every day | ORAL | Status: DC
  Administered 2020-03-10 – 2020-03-17 (×8): 2000 [IU] via ORAL
  Filled 2020-03-10 (×8): qty 2

## 2020-03-10 MED ORDER — DOCUSATE SODIUM 100 MG PO CAPS: 100.0000 mg | ORAL_CAPSULE | Freq: Two times a day (BID) | ORAL | 0 refills | Status: DC

## 2020-03-10 MED ORDER — OXYCODONE HCL 5 MG PO TABS
5.0000 mg | ORAL_TABLET | Freq: Two times a day (BID) | ORAL | Status: DC
  Administered 2020-03-10 – 2020-03-17 (×15): 5 mg via ORAL
  Filled 2020-03-10 (×16): qty 1

## 2020-03-10 MED ORDER — TAMSULOSIN HCL 0.4 MG PO CAPS
0.4000 mg | ORAL_CAPSULE | Freq: Every day | ORAL | Status: DC
  Administered 2020-03-10 – 2020-03-17 (×8): 0.4 mg via ORAL
  Filled 2020-03-10 (×8): qty 1

## 2020-03-10 MED ORDER — OXYCODONE-ACETAMINOPHEN 10-325 MG PO TABS: 1.0000 | ORAL_TABLET | Freq: Two times a day (BID) | ORAL | Status: DC

## 2020-03-10 MED ORDER — OXYCODONE-ACETAMINOPHEN 5-325 MG PO TABS: 1.0000 | ORAL_TABLET | Freq: Two times a day (BID) | ORAL | 0 refills | Status: DC

## 2020-03-10 MED ORDER — OXYCODONE-ACETAMINOPHEN 5-325 MG PO TABS
1.0000 | ORAL_TABLET | Freq: Two times a day (BID) | ORAL | Status: DC
  Administered 2020-03-10 – 2020-03-17 (×15): 1 via ORAL
  Filled 2020-03-10 (×16): qty 1

## 2020-03-10 MED ORDER — GABAPENTIN 600 MG PO TABS: 600.0000 mg | ORAL_TABLET | Freq: Four times a day (QID) | ORAL | 0 refills | Status: DC

## 2020-03-10 MED ORDER — OXYCODONE HCL 5 MG PO TABS: 5.0000 mg | ORAL_TABLET | Freq: Two times a day (BID) | ORAL | 0 refills | Status: DC

## 2020-03-10 MED ORDER — ENOXAPARIN SODIUM 40 MG/0.4ML ~~LOC~~ SOLN: 40.0000 mg | SUBCUTANEOUS | 0 refills | Status: DC

## 2020-03-10 MED ORDER — OXYCODONE HCL 20 MG PO TABS: 1.0000 | ORAL_TABLET | Freq: Four times a day (QID) | ORAL | 0 refills | Status: DC | PRN

## 2020-03-10 MED ORDER — ENSURE ENLIVE PO LIQD
237.0000 mL | Freq: Two times a day (BID) | ORAL | Status: DC
  Administered 2020-03-10 – 2020-03-16 (×7): 237 mL via ORAL

## 2020-03-10 MED ORDER — METHOCARBAMOL 500 MG PO TABS: 500.0000 mg | ORAL_TABLET | Freq: Four times a day (QID) | ORAL | 0 refills | Status: DC | PRN

## 2020-03-10 NOTE — Progress Notes (Signed)
Occupational Therapy Treatment Patient Details Name: Dustin [name] MRN: [id number] DOB: 1940-04-30 Today's Date: 03/10/2020    History of present illness 80 y.o. male with history of chronic anemia, neuropathy, chronic combined systolic and diastolic CHF last EF measured was 40 to 45% presented to ED 03/07/20 after a fall at home after slipping on ice.  Did not lose consciousness. +comminuted fracture of distal right femur. 03/08/20 intramedullary nailing of right femur fracture   OT comments  Pt in ed upon arrival to room and agreeable to participate with therapy and premedicated with pain meds. Pt is seems to expect that he will do poorly and requires max encouragement.  Pt with Fair sitting balance for EOB selfcare activity.  Pt attempted sit - stand, however was unable to achieve fully upright position. After attempt, pt with c/o dizziness that did not improve with rest. BP at 96/62 seated EOB. Further OOB activity deferred and pt returned to supine and positioned to [facility]. OT will continue to follow acutely to maximize level of function and safety  Follow Up Recommendations  SNF    Equipment Recommendations  3 in 1 bedside commode;Other (comment) (RW, TB at next venue of care)    Recommendations for Other Services      Precautions / Restrictions Precautions Precautions: Fall Precaution Comments: Check orthostatics next session Restrictions Weight Bearing Restrictions: Yes RLE Weight Bearing: Weight bearing as tolerated       Mobility Bed Mobility Overal bed mobility: Needs Assistance Bed Mobility: Supine to Sit;Sit to Supine     Supine to sit: Min assist;HOB elevated Sit to supine: Min assist   General bed mobility comments: Very light A to progress to EOB. Mod cueing for technique. HOB elevated with increased time and effort and use of bed rails.  Transfers Overall transfer level: Needs assistance Equipment used: Rolling walker (2 wheeled) Transfers: Sit to/from  Stand Sit to Stand: Mod assist;From elevated surface;+2 physical assistance         General transfer comment: Pt unable to come fully upright, Only able to progress 1 hand to RW and knees remained flexed. Further attempts deffered secondary to dizziness/hypotension.    Balance Overall balance assessment: Needs assistance Sitting-balance support: Bilateral upper extremity supported;Feet supported Sitting balance-Leahy Scale: Fair Sitting balance - Comments: can maintain balance at times w/o external support, but heavily leaning on UEs for support majority of the time.   Standing balance support: Bilateral upper extremity supported Standing balance-Leahy Scale: Zero                             ADL either performed or assessed with clinical judgement   ADL Overall ADL's : Needs assistance/impaired     Grooming: Wash/dry hands;Wash/dry face;Min guard;Sitting   Upper Body Bathing: Min guard;Sitting Upper Body Bathing Details (indicate cue type and reason): simulated seated EOB     Upper Body Dressing : Min guard;Sitting         Toilet Transfer Details (indicate cue type and reason): Mod A +2 for sit - stand form EOB to RW, pt unanle to come to complete stand in prep for [facility] transfers         Functional mobility during ADLs: Moderate assistance;+2 for physical assistance       Vision Baseline Vision/History: Wears glasses Patient Visual Report: No change from baseline     Perception     Praxis      Cognition Arousal/Alertness: Awake/alert Behavior During  Therapy: Anxious Overall Cognitive Status: Within Functional Limits for tasks assessed                                 General Comments: became anxious related to pain and movement, required encouragement for EOB activity.        Exercises Exercises: General Lower Extremity General Exercises - Lower Extremity Ankle Circles/Pumps: AROM;Right;5 reps Quad Sets: AROM;Right;5 reps;Supine  (very little quad activation) Heel Slides: Other (comment);AAROM;Right;10 reps;Supine Hip ABduction/ADduction: AAROM;Right;5 reps;Supine (very limited ROM secondary to pain.)   Shoulder Instructions       General Comments      Pertinent Vitals/ Pain       Pain Assessment: Faces Faces Pain Scale: Hurts even more Pain Location: R knee Pain Descriptors / Indicators: Operative site guarding;Grimacing;Guarding;Discomfort Pain Intervention(s): Limited activity within patient's tolerance;Premedicated before session;Monitored during session;Repositioned;Ice applied  Home Living                                          Prior Functioning/Environment              Frequency  Min 2X/week        Progress Toward Goals  OT Goals(current goals can now be found in the care plan section)  Progress towards OT goals: Not progressing toward goals - comment  Acute Rehab OT Goals Patient Stated Goal: return home  Plan Discharge plan needs to be updated    Co-evaluation    PT/OT/SLP Co-Evaluation/Treatment: Yes Reason for Co-Treatment: For patient/therapist safety;To address functional/ADL transfers PT goals addressed during session: Mobility/safety with mobility;Strengthening/ROM OT goals addressed during session: ADL's and self-care;Proper use of Adaptive equipment and DME      AM-PAC OT "6 Clicks" Daily Activity     Outcome Measure   Help from another person eating meals?: None Help from another person taking care of personal grooming?: A Little Help from another person toileting, which includes using toliet, bedpan, or urinal?: Total Help from another person bathing (including washing, rinsing, drying)?: A Lot Help from another person to put on and taking off regular upper body clothing?: A Little Help from another person to put on and taking off regular lower body clothing?: Total 6 Click Score: 14    End of Session Equipment Utilized During Treatment:  Gait belt;Rolling walker  OT Visit Diagnosis: Other abnormalities of gait and mobility (R26.89);Muscle weakness (generalized) (M62.81);History of falling (Z91.81);Pain Pain - Right/Left: Right Pain - part of body: Leg   Activity Tolerance Patient limited by pain;Other (comment) (dizziness)   Patient Left in bed;with call bell/phone within reach;Other (comment) (with PT doing LE exercises)   Nurse Communication          Time: 0272-5366 OT Time Calculation (min): 29 min  Charges: OT General Charges $OT Visit: 1 Visit OT Treatments $Self Care/Home Management : 8-22 mins     Britt Bottom 03/10/2020, 2:22 PM

## 2020-03-10 NOTE — Discharge Instructions (Signed)
[facility]th[facility]uma [facility] Discharge Instructions   General Discharge Instructions  WEIGHT BEARING STATUS: weightbearing as tolerated right leg  RANGE OF MOTION/ACTIVITY: ok for unrestricted motion as tolerated  Wound Care: Incisions can be left open to air if there is no drainage. If incision continues to have drainage, follow wound care instructions below. Okay to shower if no drainage from incisions.  DVT/PE prophylaxis: Lovenox  Diet: as you were eating previously.  Can use over the counter stool softeners and bowel preparations, such as Miralax, to help with bowel movements.  Narcotics can be constipating.  Be sure to drink plenty of fluids  PAIN MEDICATION USE AND EXPECTATIONS  You have likely been given narcotic medications to help control your pain.  After a traumatic event that results in an fracture (broken bone) with or without surgery, it is ok to use narcotic pain medications to help control one's pain.  We understand that everyone responds to pain differently and each individual patient will be evaluated on a regular basis for the continued need for narcotic medications. Ideally, narcotic medication use should last no more than 6-8 weeks (coinciding with fracture healing).   As a patient it is your responsibility as well to monitor narcotic medication use and report the amount and frequency you use these medications when you come to your office visit.   We would also advise that if you are using narcotic medications, you should take a dose prior to therapy to maximize you participation.  IF YOU ARE ON NARCOTIC MEDICATIONS IT IS NOT PERMISSIBLE TO OPERATE A MOTOR VEHICLE (MOTORCYCLE/CAR/TRUCK/MOPED) OR HEAVY MACHINERY DO NOT MIX NARCOTICS WITH OTHER CNS (CENTRAL NERVOUS SYSTEM) DEPRESSANTS SUCH AS ALCOHOL   STOP SMOKING OR USING NICOTINE PRODUCTS!!!!  As discussed nicotine severely impairs your body's ability to heal surgical and traumatic wounds but also impairs bone  healing.  Wounds and bone heal by forming microscopic blood vessels (angiogenesis) and nicotine is a vasoconstrictor (essentially, shrinks blood vessels).  Therefore, if vasoconstriction occurs to these microscopic blood vessels they essentially disappear and are unable to deliver necessary nutrients to the healing tissue.  This is one modifiable factor that you can do to dramatically increase your chances of healing your injury.    (This means no smoking, no nicotine gum, patches, etc)  DO NOT USE NONSTEROIDAL ANTI-INFLAMMATORY DRUGS (NSAID'S)  Using products such as Advil (ibuprofen), Aleve (naproxen), Motrin (ibuprofen) for additional pain control during fracture healing can delay and/or prevent the healing response.  If you would like to take over the counter (OTC) medication, Tylenol (acetaminophen) is ok.  However, some narcotic medications that are given for pain control contain acetaminophen as well. Therefore, you should not exceed more than 4000 mg of tylenol in a day if you do not have liver disease.  Also note that there are may OTC medicines, such as cold medicines and allergy medicines that my contain tylenol as well.  If you have any questions about medications and/or interactions please ask your doctor/PA or your pharmacist.      ICE AND ELEVATE INJURED/OPERATIVE EXTREMITY  Using ice and elevating the injured extremity above your heart can help with swelling and pain control.  Icing in a pulsatile fashion, such as 20 minutes on and 20 minutes off, can be followed.    Do not place ice directly on skin. Make sure there is a barrier between to skin and the ice pack.    Using frozen items such as frozen peas works well as the  conform nicely to the are that needs to be iced.  USE AN ACE WRAP OR TED HOSE FOR SWELLING CONTROL  In addition to icing and elevation, Ace wraps or TED hose are used to help limit and resolve swelling.  It is recommended to use Ace wraps or TED hose until you are  informed to stop.    When using Ace Wraps start the wrapping distally (farthest away from the body) and wrap proximally (closer to the body)   Example: If you had surgery on your leg or thing and you do not have a splint on, start the ace wrap at the toes and work your way up to the thigh        If you had surgery on your upper extremity and do not have a splint on, start the ace wrap at your fingers and work your way up to the upper arm   [place]: [phone]   VISIT OUR WEBSITE FOR ADDITIONAL INFORMATION: [email or web address]     Discharge Wound Care Instructions  Do NOT apply any ointments, solutions or lotions to pin sites or surgical wounds.  These prevent needed drainage and even though solutions like hydrogen peroxide kill bacteria, they also damage cells lining the pin sites that help fight infection.  Applying lotions or ointments can keep the wounds moist and can cause them to breakdown and open up as well. This can increase the risk for infection. When in doubt call the office.  Surgical incisions should be dressed daily.  If any drainage is noted, use one layer of adaptic, then gauze, Kerlix, and an ace wrap.  Once the incision is completely dry and without drainage, it may be left open to air out.  Showering may begin 36-48 hours later.  Cleaning gently with soap and water.  Traumatic wounds should be dressed daily as well.    One layer of adaptic, gauze, Kerlix, then ace wrap.  The adaptic can be discontinued once the draining has ceased    If you have a wet to dry dressing: wet the gauze with saline the squeeze as much saline out so the gauze is moist (not soaking wet), place moistened gauze over wound, then place a dry gauze over the moist one, followed by Kerlix wrap, then ace wrap.

## 2020-03-10 NOTE — Plan of Care (Addendum)
Bladder scan 659mL Patient states he does not have the urge to pee. Paged Dr. Sharlet Salina to see if pt needs foley d/t x2 I&O caths.   Foley placed, urine return present, patient tolerated it well.  Problem: Education: Goal: Knowledge of General Education information will improve Description: Including pain rating scale, medication(s)/side effects and non-pharmacologic comfort measures Outcome: Progressing   Problem: Activity: Goal: Risk for activity intolerance will decrease Outcome: Progressing   Problem: Pain Managment: Goal: General experience of comfort will improve Outcome: Progressing   Problem: Safety: Goal: Ability to remain free from injury will improve Outcome: Progressing   Problem: Skin Integrity: Goal: Risk for impaired skin integrity will decrease Outcome: Progressing

## 2020-03-10 NOTE — Progress Notes (Addendum)
PROGRESS NOTE  SRIYAN CUTTING FTD:[id number] DOB: [birth year] DOA: 03/07/2020 PCP: Lujean Amel, MD   LOS: 3 days   Brief narrative:  Dustin [name] is a 80 y.o. male with history of chronic anemia, neuropathy, chronic combined systolic and diastolic CHF, last EF of  40 to 45% with grade 1 diastolic dysfunction in 0623 presented to the hospital after having a fall after slipping on the ice.  Patient did not lose any consciousness but had significant pain in the right groin area.  In the ED,  X-rays revealed acute comminuted fracture of right distal femur.  Labs were significant for anemia.  On-call orthopedic surgeon was consulted and the patient was admitted for further management of the right thigh distal femur fracture.  COVID test was negative.    Patient underwent IM nailing by orthopedics on 03/08/2020.  They are waiting for rehabilitation placement.  Assessment/Plan:  Principal Problem:   Femur fracture (HCC) Active Problems:   Peripheral axonal neuropathy   Hip fracture (HCC)  Acute comminuted distal right femur fracture status post mechanical fall.  Orthopedics on board.  Patient was initially put on hip traction and subsequently underwent IM nailing of the right femur on 03/08/2020.  Physical therapy and Occupational Therapy on board.  Recommending CIR/skilled nursing facility.  Patient had initially refused skilled nursing but has changed his mind.  He would like to proceed with rehabilitation placement.  Weightbearing as tolerated.  Mild acute perioperative blood loss anemia on chronic anemia  Hemoglobin of 7.5 at this time.  Hemodynamically stable.  No need for transfusion.  We will continue to monitor closely.  Transfuse if hemoglobin less than 7  Mild leukocytosis likely reactive in nature.  This has improved.  History of neuropathy on gabapentin.  Will continue.  Patient also has pain medication regimen with oxycodone and Percocet at home.  He follows up with pain  management as outpatient.  Patient is requesting his home regimen to be started here in the hospital.  We will put the patient on Percocet and oxycodone.  History of chronic combined systolic and diastolic CHF  2 Dechocardiogram in 2017 showed  left ventricular ejection fraction of 40 to 45% with grade 1 diastolic dysfunction and 7628.  Compensated at this time.  Discontinue IV fluids at this time.  Abnormal chest x-ray with question of right apical airspace disease/atelectasis.  No pulmonary symptoms.  Will need outpatient follow-up.  Mild hyponatremia.  We will continue to monitor closely.  Asymptomatic at this time.  Postoperative acute urinary retention.  Failed in and out cath x2.  Required Foley catheter for significant retention.  Consider voiding trial by a.m.  Could add Flomax if necessary.  DVT prophylaxis: enoxaparin (LOVENOX) injection 40 mg Start: 03/09/20 0800 SCDs Start: 03/08/20 1321 SCDs Start: 03/07/20 2222   Code Status: DNR  Family Communication:  None today.  I spoke with the patient's spouse on the phone and updated her about the clinical condition of the patient yesterday.  Status is: Inpatient  Remains inpatient appropriate because:IV treatments appropriate due to intensity of illness or inability to take PO, Inpatient level of care appropriate due to severity of illness and status post comminuted femur fracture status post surgery, needing rehabilitation.   Dispo: The patient is from: Home              Anticipated d/c is to: Seen by physical therapy today   recommending CI/SNF. transition of care on board for disposition.     Anticipated  d/c date is: 2 days              Patient currently is not medically stable to d/c.   Consultants:  Orthopedics  Procedures:  IM nailing of right distal femoral fracture on 03/08/2020 by orthopedics  Anti-infectives:  . None  Subjective: Today, patient was seen and examined at bedside.  Complains of 2 x 10 pain over  the femur after taking oxycodone.  Denies any nausea, vomiting fever chills shortness of breath. Patient had a significant urinary retention and required Foley catheter after in and out cath.   Objective: Vitals:   03/10/20 0419 03/10/20 0840  BP: (!) 104/50 124/60  Pulse: 98 82  Resp: 17 17  Temp: 98 F (36.7 C) 98.2 F (36.8 C)  SpO2: 97% 99%    Intake/Output Summary (Last 24 hours) at 03/10/2020 0928 Last data filed at 03/10/2020 [id number] Gross per 24 hour  Intake 240 ml  Output 2333 ml  Net -2093 ml   Filed Weights   03/07/20 1654 03/08/20 0245  Weight: 91.6 kg 94.5 kg   Body mass index is 26.75 kg/m.   Physical Exam: GENERAL: Patient is alert awake and oriented, not in obvious distress HENT: No scleral pallor or icterus. Pupils equally reactive to light. Oral mucosa is moist NECK: is supple, no gross swelling noted. CHEST: Clear to auscultation. No crackles or wheezes.  Diminished breath sounds bilaterally. CVS: S1 and S2 heard, no murmur. Regular rate and rhythm.  ABDOMEN: Soft, non-tender, bowel sounds are present.  Foley catheter in place. EXTREMITIES: Right lower extremity -thigh edema, status post status femur surgery, capillary refill present. CNS: Cranial nerves are intact. No focal motor deficits. SKIN: warm and dry without rashes.  Status post right femur surgery   Data Review: I have personally reviewed the following laboratory data and studies,  CBC: Recent Labs  Lab 03/07/20 1643 03/08/20 0338 03/08/20 1402 03/09/20 0222 03/10/20 0219  WBC 7.9 9.8 16.2* 9.8 8.4  NEUTROABS 5.2  --   --   --   --   HGB 11.2* 10.0* 10.1* 8.5* 7.5*  HCT 34.1* 28.4* 28.7* 24.0* 21.6*  MCV 88.1 85.0 86.4 85.4 86.1  PLT 172 181 214 190 [id number]*   Basic Metabolic Panel: Recent Labs  Lab 03/07/20 1643 03/08/20 0338 03/08/20 1402 03/09/20 0222 03/10/20 0219  NA 135 132*  --  133* 130*  K 4.2 4.1  --  4.5 4.3  CL 99 100  --  98 97*  CO2 25 21*  --  27 25  GLUCOSE 121*  131*  --  138* 124*  BUN 14 14  --  18 19  CREATININE 1.28* 1.24 1.38* 1.31* 1.16  CALCIUM 8.9 9.0  --  8.4* 8.4*   Liver Function Tests: Recent Labs  Lab 03/07/20 1643  AST 24  ALT 13  ALKPHOS 74  BILITOT 0.4  PROT 6.6  ALBUMIN 3.9   No results for input(s): LIPASE, AMYLASE in the last 168 hours. No results for input(s): AMMONIA in the last 168 hours. Cardiac Enzymes: No results for input(s): CKTOTAL, CKMB, CKMBINDEX, TROPONINI in the last 168 hours. BNP (last 3 results) No results for input(s): BNP in the last 8760 hours.  ProBNP (last 3 results) No results for input(s): PROBNP in the last 8760 hours.  CBG: No results for input(s): GLUCAP in the last 168 hours. Recent Results (from the past 240 hour(s))  Resp Panel by RT-PCR (Flu A&B, Covid) Nasopharyngeal Swab  Status: None   Collection Time: 03/07/20 11:27 PM   Specimen: Nasopharyngeal Swab; Nasopharyngeal(NP) swabs in vial transport medium  Result Value Ref Range Status   SARS Coronavirus 2 by RT PCR NEGATIVE NEGATIVE Final    Comment: (NOTE) SARS-CoV-2 target nucleic acids are NOT DETECTED.  The SARS-CoV-2 RNA is generally detectable in upper respiratory specimens during the acute phase of infection. The lowest concentration of SARS-CoV-2 viral copies this assay can detect is 138 copies/mL. A negative result does not preclude SARS-Cov-2 infection and should not be used as the sole basis for treatment or other patient management decisions. A negative result may occur with  improper specimen collection/handling, submission of specimen other than nasopharyngeal swab, presence of viral mutation(s) within the areas targeted by this assay, and inadequate number of viral copies(<138 copies/mL). A negative result must be combined with clinical observations, patient history, and epidemiological information. The expected result is Negative.  Fact Sheet for Patients:  EntrepreneurPulse.com.au  Fact  Sheet for Healthcare Providers:  IncredibleEmployment.be  This test is no t yet approved or cleared by the [place] FDA and  has been authorized for detection and/or diagnosis of SARS-CoV-2 by FDA under an Emergency Use Authorization (EUA). This EUA will remain  in effect (meaning this test can be used) for the duration of the COVID-19 declaration under Section 564(b)(1) of the Act, 21 U.S.C.section 360bbb-3(b)(1), unless the authorization is terminated  or revoked sooner.       Influenza A by PCR NEGATIVE NEGATIVE Final   Influenza B by PCR NEGATIVE NEGATIVE Final    Comment: (NOTE) The Xpert Xpress SARS-CoV-2/FLU/RSV plus assay is intended as an aid in the diagnosis of influenza from Nasopharyngeal swab specimens and should not be used as a sole basis for treatment. Nasal washings and aspirates are unacceptable for Xpert Xpress SARS-CoV-2/FLU/RSV testing.  Fact Sheet for Patients: EntrepreneurPulse.com.au  Fact Sheet for Healthcare Providers: IncredibleEmployment.be  This test is not yet approved or cleared by the [place] FDA and has been authorized for detection and/or diagnosis of SARS-CoV-2 by FDA under an Emergency Use Authorization (EUA). This EUA will remain in effect (meaning this test can be used) for the duration of the COVID-19 declaration under Section 564(b)(1) of the Act, 21 U.S.C. section 360bbb-3(b)(1), unless the authorization is terminated or revoked.  Performed at [facility], [place] [address] [place], [place] [address]   Surgical pcr screen     Status: None   Collection Time: 03/08/20  3:28 AM   Specimen: Nasal Mucosa; Nasal Swab  Result Value Ref Range Status   MRSA, PCR NEGATIVE NEGATIVE Final   Staphylococcus aureus NEGATIVE NEGATIVE Final    Comment: (NOTE) The Xpert SA Assay (FDA approved for NASAL specimens in patients 22 years of age and older), is one component of a  comprehensive surveillance program. It is not intended to diagnose infection nor to guide or monitor treatment. Performed at [facility], [place] [address] [place], [place] [address]      Studies: DG C-Arm 1-60 Min  Result Date: 03/08/2020 CLINICAL DATA:  Retrograde IM nail of right femur. EXAM: DG C-ARM 1-60 MIN; RIGHT FEMUR 2 VIEWS FLUOROSCOPY TIME:  Fluoroscopy Time:  2 minutes and 12 seconds. Port radiation: 17.36 mGy. COMPARISON:  Radiographs 03/07/2020 FINDINGS: Eight C-arm fluoroscopic images were obtained intraoperatively and submitted for post operative interpretation. These images demonstrate surgical changes of intramedullary nail and screw fixation of a distal femur fracture with improved alignment. Multiple mildly displaced  butterfly fragments are noted. No unexpected findings. Please see the performing provider's procedural report for further detail. IMPRESSION: Intraoperative fluoroscopic imaging, as detailed above. Electronically Signed   By: Margaretha Sheffield MD   On: 03/08/2020 11:35   DG FEMUR, MIN 2 VIEWS RIGHT  Result Date: 03/08/2020 CLINICAL DATA:  Retrograde IM nail of right femur. EXAM: DG C-ARM 1-60 MIN; RIGHT FEMUR 2 VIEWS FLUOROSCOPY TIME:  Fluoroscopy Time:  2 minutes and 12 seconds. Port radiation: 17.36 mGy. COMPARISON:  Radiographs 03/07/2020 FINDINGS: Eight C-arm fluoroscopic images were obtained intraoperatively and submitted for post operative interpretation. These images demonstrate surgical changes of intramedullary nail and screw fixation of a distal femur fracture with improved alignment. Multiple mildly displaced butterfly fragments are noted. No unexpected findings. Please see the performing provider's procedural report for further detail. IMPRESSION: Intraoperative fluoroscopic imaging, as detailed above. Electronically Signed   By: Margaretha Sheffield MD   On: 03/08/2020 11:35   DG FEMUR PORT, MIN 2 VIEWS RIGHT  Result Date: 03/08/2020 CLINICAL DATA:   ORIF of the distal femur EXAM: RIGHT FEMUR PORTABLE 2 VIEW COMPARISON:  None. FINDINGS: Comminuted distal femoral diaphysis extending into the metaphysis fracture transfixed with a intramedullary nail and interlocking screws. Alignment is near anatomic. Posteriorly displaced 8 x 35 mm fracture fragment. No other acute fracture or dislocation. IMPRESSION: 1. Interval ORIF of a comminuted distal femoral fracture in near-anatomic alignment. Electronically Signed   By: Kathreen Devoid   On: 03/08/2020 15:22      Flora Lipps, MD  [facility] 03/10/2020  If 7PM-7AM, please contact night-coverage

## 2020-03-10 NOTE — TOC Progression Note (Signed)
Transition of Care [facility]) - Progression Note    Patient Details  Name: Dustin [name] MRN: [id number] Date of Birth: [birth year]  Transition of Care [facility]) CM/SW Contact  Wandra Feinstein [place], [place] Phone Number: 03/10/2020, 3:20 PM  Clinical Narrative:   Provided pt with current SNF offers [facility] and [facility]). Pt to research ratings before making decision. Will f/u with pt re SNF choice.   Wandra Feinstein, MSW, LCSW [phone] (coverage)       Expected Discharge Plan: [place] Barriers to Discharge: Continued Medical Work up  Expected Discharge Plan and Services Expected Discharge Plan: [place]   Discharge Planning Services: CM Consult   Living arrangements for the past 2 months: Single Family Home                                       Social Determinants of Health (SDOH) Interventions    Readmission Risk Interventions No flowsheet data found.

## 2020-03-10 NOTE — Care Management Important Message (Signed)
Important Message  Patient Details  Name: Dustin [name] MRN: [id number] Date of Birth: [birth year]   Medicare Important Message Given:  Yes     Vitaliy Eisenhour P [place] 03/10/2020, 2:42 PM

## 2020-03-10 NOTE — Plan of Care (Signed)
  Problem: Elimination: Goal: Will not experience complications related to bowel motility Outcome: Progressing Goal: Will not experience complications related to urinary retention Outcome: Progressing   Problem: Pain Managment: Goal: General experience of comfort will improve Outcome: Progressing   Problem: Safety: Goal: Ability to remain free from injury will improve Outcome: Progressing   

## 2020-03-10 NOTE — Progress Notes (Signed)
Physical Therapy Treatment Patient Details Name: Dustin [name] MRN: [id number] DOB: 10/29/1940 Today's Date: 03/10/2020    History of Present Illness 80 y.o. male with history of chronic anemia, neuropathy, chronic combined systolic and diastolic CHF last EF measured was 40 to 45% presented to ED 03/07/20 after a fall at home after slipping on ice.  Did not lose consciousness. +comminuted fracture of distal right femur. 03/08/20 intramedullary nailing of right femur fracture    PT Comments    On arrival to room pt agreeable to participate with therapy and premedicated. Pt is seems to expect that he will do poorly and requires max encouragement.  Once seated EOB Pt attempted sit<>stand, however was unable to achieve fully upright position. After attempt, pt with c/o dizziness that did not improve with rest. BP at 96/62. Further OOB activity deferred. Pt instead performed LE HEP in supine. Noted minimal quad activation and increased pain with knee flexion.  Plan to check orthostatic vitals next session per RN request. Will continue to follow acutely.     Follow Up Recommendations  CIR     Equipment Recommendations  Rolling walker with 5" wheels;3in1 (PT)    Recommendations for Other Services Rehab consult;OT consult     Precautions / Restrictions Precautions Precautions: Fall Precaution Comments: Check orthostatics next session Restrictions Weight Bearing Restrictions: Yes RLE Weight Bearing: Weight bearing as tolerated    Mobility  Bed Mobility Overal bed mobility: Needs Assistance Bed Mobility: Supine to Sit;Sit to Supine     Supine to sit: Min assist;HOB elevated Sit to supine: Min assist   General bed mobility comments: Very light A to progress to EOB. Mod cueing for technique. HOB elevated with increased time and effort and use of bed rails.  Transfers Overall transfer level: Needs assistance Equipment used: Rolling walker (2 wheeled) Transfers: Sit to/from  Stand Sit to Stand: Mod assist;From elevated surface;+2 physical assistance (bed elevated to simulate home)         General transfer comment: Pt unable to come fully upright, Only able to progress 1 hand to RW and knees remained flexed. Further attempts deffered secondary to dizziness/hypotension.  Ambulation/Gait                 Stairs             Wheelchair Mobility    Modified Rankin (Stroke Patients Only)       Balance Overall balance assessment: Needs assistance Sitting-balance support: Bilateral upper extremity supported;Feet supported Sitting balance-Leahy Scale: Fair Sitting balance - Comments: can maintain balance at times w/o external support, but heavily leaning on UEs for support majority of the time.   Standing balance support: Bilateral upper extremity supported Standing balance-Leahy Scale: Zero                              Cognition Arousal/Alertness: Awake/alert Behavior During Therapy: Anxious Overall Cognitive Status: Within Functional Limits for tasks assessed                                 General Comments: became anxious related to pain and movement, required encouragement for EOB activity.      Exercises General Exercises - Lower Extremity Ankle Circles/Pumps: AROM;Right;5 reps Quad Sets: AROM;Right;5 reps;Supine (very little quad activation) Heel Slides: Other (comment);AAROM;Right;10 reps;Supine Hip ABduction/ADduction: AAROM;Right;5 reps;Supine (very limited ROM secondary to pain.)    General  Comments        Pertinent Vitals/Pain Pain Assessment: Faces Faces Pain Scale: Hurts even more Pain Location: R knee Pain Descriptors / Indicators: Operative site guarding;Grimacing;Guarding;Discomfort Pain Intervention(s): Monitored during session;Limited activity within patient's tolerance;Ice applied;Premedicated before session    Home Living                      Prior Function             PT Goals (current goals can now be found in the care plan section) Acute Rehab PT Goals Patient Stated Goal: return home PT Goal Formulation: With patient Time For Goal Achievement: 03/23/20 Potential to Achieve Goals: Good Progress towards PT goals: Progressing toward goals (slowly)    Frequency    Min 5X/week      PT Plan Current plan remains appropriate    Co-evaluation PT/OT/SLP Co-Evaluation/Treatment: Yes Reason for Co-Treatment: For patient/therapist safety;To address functional/ADL transfers PT goals addressed during session: Mobility/safety with mobility;Strengthening/ROM        AM-PAC PT "6 Clicks" Mobility   Outcome Measure  Help needed turning from your back to your side while in a flat bed without using bedrails?: A Lot Help needed moving from lying on your back to sitting on the side of a flat bed without using bedrails?: A Lot Help needed moving to and from a bed to a chair (including a wheelchair)?: Total Help needed standing up from a chair using your arms (e.g., wheelchair or bedside chair)?: Total Help needed to walk in hospital room?: Total Help needed climbing 3-5 steps with a railing? : Total 6 Click Score: 8    End of Session Equipment Utilized During Treatment: Gait belt Activity Tolerance: Patient limited by pain;Treatment limited secondary to medical complications (Comment) (hypotension) Patient left: in bed;with call bell/phone within reach;with bed alarm set Nurse Communication: Mobility status;Other (comment) (Hypotension) PT Visit Diagnosis: Other abnormalities of gait and mobility (R26.89);History of falling (Z91.81);Pain Pain - Right/Left: Right Pain - part of body: Knee     Time: 1700-1749 PT Time Calculation (min) (ACUTE ONLY): 40 min  Charges:  $Therapeutic Exercise: 8-22 mins $Therapeutic Activity: 8-22 mins                     Benjiman Core, [place] Pager [phone] Acute Rehab   Allena Katz 03/10/2020, 1:11 PM

## 2020-03-10 NOTE — Progress Notes (Signed)
Nutrition Follow-up  DOCUMENTATION CODES:   Not applicable  INTERVENTION:   - Ensure Enlive po BID (350 kcal, 20 g of protein) [vanilla] -continue MVI with minerals  NUTRITION DIAGNOSIS:   Increased nutrient needs related to post-op healing as evidenced by estimated needs.  GOAL:   Patient will meet greater than or equal to 90% of their needs  MONITOR:   PO intake,Supplement acceptance,Labs,Weight trends,Skin,I & O's,Diet advancement  REASON FOR ASSESSMENT:   Consult Assessment of nutrition requirement/status,Hip fracture protocol  ASSESSMENT:   Dustin [name] is a 80 y.o. male with history of chronic anemia, neuropathy, chronic combined systolic and diastolic CHF last EF measured was 40 to 45% with grade 1 diastolic dysfunction in 2376 had a fall at home after slipping on ice.  Did not lose consciousness.  Has significant pain in the right thigh area.  03/07/20 - pt admitted for a right femoral shaft fracture s/p fall. He was placed in [place] skeletal traction.   03/08/20 - pt had surgery of intramedullary (IM) retrograde femoral nailing (right).   Pt indicated that he has been eating some of his meals, but is not accepting of all of the food. Notes indicated that pt was eating 75% of his meals. RD discussed with pt the benefits of eating his meals as well as Ensure Enlive when eaten with meals to aid in the healing process and for extra energy and protein. The pt was receptive to taking Ensure Enlive BID and requested vanilla.   Pt discussed that at home he typically eats 2 meals a day. 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 His typical meals include: breakfast: a frozen breakfast bowl and dinner: a meat and a vegetable with bread occasionally.   Pt discussed that he felt that he had gained some weight prior to coming to the hospital, but felt he had lost some weight while he was in the hospital. Weight trends indicate a 9%  weight gain in the past 5 months.   Meds reviewed: vitamin D3 (2000 IU) daily, colace (100 mg) BID, MVI with minerals daily  Labs reviewed.  I&O reviewed: 900 mL output, 240 mL PO  NUTRITION - FOCUSED PHYSICAL EXAM:  Flowsheet Row Most Recent Value  Orbital Region No depletion  Upper Arm Region No depletion  Thoracic and Lumbar Region No depletion  Buccal Region Mild depletion  Temple Region Mild depletion  Clavicle Bone Region Moderate depletion  Clavicle and Acromion Bone Region Moderate depletion  Scapular Bone Region No depletion  Dorsal Hand No depletion  Patellar Region Mild depletion  Anterior Thigh Region Moderate depletion  Posterior Calf Region Moderate depletion  Edema (RD Assessment) None  Hair Reviewed  Eyes Reviewed  Mouth Reviewed  Skin Reviewed  Nails Reviewed       Diet Order:   Diet Order            Diet Heart Room service appropriate? Yes; Fluid consistency: Thin  Diet effective now                 EDUCATION NEEDS:   No education needs have been identified at this time  Skin:  Skin Assessment: Skin Integrity Issues: Skin Integrity Issues:: Incisions Incisions: closed rt leg  Last BM:  03/07/20  Height:   Ht Readings from Last 1 Encounters:  03/07/20 6\' 2"  (1.88 m)    Weight:   Wt Readings from Last 1 Encounters:  03/08/20 94.5 kg  Ideal Body Weight:  86.4 kg  BMI:  Body mass index is 26.75 kg/m.  Estimated Nutritional Needs:   Kcal:  2200-2400  Protein:  120-135 grams  Fluid:  > 2 L  Salvadore Oxford, Dietetic Intern 03/10/2020 2:59 PM

## 2020-03-10 NOTE — Progress Notes (Signed)
Orthopaedic Trauma Progress Note  SUBJECTIVE: Reports moderate pain about operative site, worst in the knee. Better than yesterday once medications adjusted. No chest pain. No SOB. No nausea/vomiting. Therapies recommending SNF, patient now amenable to this. TOC following and has provided patient with list of facilities.   OBJECTIVE:  Vitals:   03/10/20 0419 03/10/20 0840  BP: (!) 104/50 124/60  Pulse: 98 82  Resp: 17 17  Temp: 98 F (36.7 C) 98.2 F (36.8 C)  SpO2: 97% 99%    General: Sitting up in bed, no acute distress but appears in pain Respiratory: No increased work of breathing.  Right lower extremity: Dressing removed, incisions clean, dry, intact.  Tenderness with palpation over the knee and throughout the thigh.  Tolerates minimal motion of the knee or hip secondary to pain.  Ankle dorsiflexion/plantarflexion is intact.  Patient with neuropathy at baseline so minimal sensation to light touch distally.+ DP pulse  IMAGING: Stable post op imaging.   LABS:  Results for orders placed or performed during the hospital encounter of 03/07/20 (from the past 24 hour(s))  Basic metabolic panel     Status: Abnormal   Collection Time: 03/10/20  2:19 AM  Result Value Ref Range   Sodium 130 (L) 135 - 145 mmol/L   Potassium 4.3 3.5 - 5.1 mmol/L   Chloride 97 (L) 98 - 111 mmol/L   CO2 25 22 - 32 mmol/L   Glucose, Bld 124 (H) 70 - 99 mg/dL   BUN 19 8 - 23 mg/dL   Creatinine, Ser 1.16 0.61 - 1.24 mg/dL   Calcium 8.4 (L) 8.9 - 10.3 mg/dL   GFR, Estimated >60 >60 mL/min   Anion gap 8 5 - 15  CBC     Status: Abnormal   Collection Time: 03/10/20  2:19 AM  Result Value Ref Range   WBC 8.4 4.0 - 10.5 K/uL   RBC 2.51 (L) 4.22 - 5.81 MIL/uL   Hemoglobin 7.5 (L) 13.0 - 17.0 g/dL   HCT 21.6 (L) 39.0 - 52.0 %   MCV 86.1 80.0 - 100.0 fL   MCH 29.9 26.0 - 34.0 pg   MCHC 34.7 30.0 - 36.0 g/dL   RDW 13.2 11.5 - 15.5 %   Platelets 144 (L) 150 - 400 K/uL   nRBC 0.0 0.0 - 0.2 %    ASSESSMENT:  Dustin [name] is a 80 y.o. male, 2 Days Post-Op s/p RETROGRADE INTRAMEDULLARY NAILING RIGHT FEMUR   CV/Blood loss: Acute blood loss anemia, Hgb 7.5 this morning. Hemodynamically stable. Continue to monitor CBC  PLAN: Weightbearing: WBAT RLE Incisional and dressing care: Removed today. Ok to leave open to air Showering: Okay to begin showering 03/11/2020 Orthopedic device(s): None  Pain management:  1. Tylenol 325-650 mg q 6 hours PRN 2. Robaxin 500 mg q 6 hours PRN 3. Oxycodone 20 mg QID PRN 4. Percocet 10-325 mg q 12 hours PRN 5. Neurontin 300 mg QID 6. Morphine 0.5 mg q 4 hours PRN VTE prophylaxis: Lovenox, SCDs ID:  Ancef 2gm post op completed Foley/Lines:  Foley in place, KVO IVFs Impediments to Fracture Healing: Vitamin D level 22, will start supplementation  Dispo: Therapies as tolerated, recommending SNF. Patient amenable to this. Ok for d/c from ortho standpoint once cleared by medicine team and therapies. Have signed placed rx for pain meds and DVT prophylaxis in chart Follow - up plan: 2 weeks  Contact information:  Katha Hamming MD, Patrecia Pace PA-C. After hours and holidays please check [email or web address]  for group call information for [facility]   Dustin Amaro A. Ricci Barker, PA-C [phone] (office) [email or web address]

## 2020-03-11 LAB — BASIC METABOLIC PANEL
Anion gap: 11 (ref 5–15)
BUN: 22 mg/dL (ref 8–23)
CO2: 24 mmol/L (ref 22–32)
Calcium: 8.2 mg/dL — ABNORMAL LOW (ref 8.9–10.3)
Chloride: 95 mmol/L — ABNORMAL LOW (ref 98–111)
Creatinine, Ser: 1.27 mg/dL — ABNORMAL HIGH (ref 0.61–1.24)
GFR, Estimated: 58 mL/min — ABNORMAL LOW (ref >60)
Glucose, Bld: 129 mg/dL — ABNORMAL HIGH (ref 70–99)
Potassium: 3.7 mmol/L (ref 3.5–5.1)
Sodium: 130 mmol/L — ABNORMAL LOW (ref 135–145)

## 2020-03-11 LAB — CBC
HCT: 22.1 % — ABNORMAL LOW (ref 39.0–52.0)
Hemoglobin: 7.3 g/dL — ABNORMAL LOW (ref 13.0–17.0)
MCH: 29 pg (ref 26.0–34.0)
MCHC: 33 g/dL (ref 30.0–36.0)
MCV: 87.7 fL (ref 80.0–100.0)
Platelets: 158 10*3/uL (ref 150–400)
RBC: 2.52 MIL/uL — ABNORMAL LOW (ref 4.22–5.81)
RDW: 13.2 % (ref 11.5–15.5)
WBC: 8.3 10*3/uL (ref 4.0–10.5)
nRBC: 0 % (ref 0.0–0.2)

## 2020-03-11 LAB — MAGNESIUM: Magnesium: 2.1 mg/dL (ref 1.7–2.4)

## 2020-03-11 MED ORDER — POLYETHYLENE GLYCOL 3350 17 G PO PACK
17.0000 g | PACK | Freq: Two times a day (BID) | ORAL | Status: DC
  Administered 2020-03-11 – 2020-03-16 (×4): 17 g via ORAL
  Filled 2020-03-11 (×9): qty 1

## 2020-03-11 NOTE — Plan of Care (Signed)

## 2020-03-11 NOTE — Progress Notes (Addendum)
PROGRESS NOTE  Dustin [name] PYK:[id number] DOB: 05-17-1940 DOA: 03/07/2020 PCP: Lujean Amel, MD  HPI/Recap of past 24 hours: Dustin Jansky Johnsonis a 80 y.o.malewithhistory of chronic anemia, neuropathy, chronic combined systolic and diastolic CHF, last EF of  40 to 45% with grade 1 diastolic dysfunction in 5397 presented to the hospital after having a fall after slipping on the ice.  Patient did not lose any consciousness but had significant pain in the right groin area.  In the ED, X-rays revealed acute comminuted fracture of right distal femur. Labs were significant for anemia. On-call orthopedic surgeon was consulted and the patient was admitted for further management of the right thigh distal femur fracture. COVID test was negative.    Subjective: March 11, 2020: Patient seen and examined at bedside.  Complains of pain on the right femoral from his fracture .  He is status post internal fixator General and 19 2021.  Also he complains of not having bowel movement but then said he has not been eating much because he has poor appetite Also complained that at home before he even fell and had a fracture he was on pain medication: Percocet 10/325 1 tablet twice daily and oxycodone 20 mg 4 times daily and also morphine.  I reviewed his home meds , could only see the oxycodone, tramadol and the Percocet.  Currently he is on oxycodone 20 mg 4 times a day as needed, oxycodone IR 5 mg every 12 hours, Percocet 5/325 1 tablet every 12 hours And morphine 0.5 mg as needed pain   assessment/Plan: Principal Problem:   Femur fracture (HCC) Active Problems:   Peripheral axonal neuropathy   Hip fracture (HCC)  #1 right hip fracture status post internal fixator waiting for rehab  2.  Chronic pain on long-term opiate therapy  3.  Constipation likely from heavy dose opioid use.  He is already on MiraLAX daily as needed and docusate sodium 1 tablet twice daily.  I will change MiraLAX to twice  daily  4.  Chronic neuropathy patient is on gabapentin this will also help with his pain   Code Status: DNR  Severity of Illness: The appropriate patient status for this patient is INPATIENT. Inpatient status is judged to be reasonable and necessary in order to provide the required intensity of service to ensure the patient's safety. The patient's presenting symptoms, physical exam findings, and initial radiographic and laboratory data in the context of their chronic comorbidities is felt to place them at high risk for further clinical deterioration. Furthermore, it is not anticipated that the patient will be medically stable for discharge from the hospital within 2 midnights of admission. The following factors support the patient status of inpatient.   " The patient's presenting symptoms include fall with fracture. " The worrisome physical exam findings include fall with fracture. " The initial radiographic and laboratory data are worrisome because of requesting and waiting for rehab. " The chronic co-morbidities include chronic pain.   * I certify that at the point of admission it is my clinical judgment that the patient will require inpatient hospital care spanning beyond 2 midnights from the point of admission due to high intensity of service, high risk for further deterioration and high frequency of surveillance required.*    Family Communication: Patient  Disposition Plan: Rehab   Consultants:  Orthopedic  Procedures:  Right femur internal fixator  Antimicrobials:  None  DVT prophylaxis: Lovenox   Objective: Vitals:   03/10/20 1100 03/10/20 1930 03/11/20  0500 03/11/20 0850  BP: 96/62 128/83 118/77 114/68  Pulse: 70 (!) 101 88 94  Resp:  16 16 17   Temp:  98.7 F (37.1 C) 98.6 F (37 C) 98.6 F (37 C)  TempSrc:  Oral Oral Oral  SpO2:  98% 99% 100%  Weight:      Height:       No intake or output data in the 24 hours ending 03/11/20 0914 Filed Weights    03/07/20 1654 03/08/20 0245  Weight: 91.6 kg 94.5 kg   Body mass index is 26.75 kg/m.  Exam:  . General: 80 y.o. year-old male well developed well nourished in no acute distress.  Alert and oriented x3.  Pleasant . Cardiovascular: Regular rate and rhythm with no rubs or gallops.  No thyromegaly or JVD noted.   Marland Kitchen Respiratory: Clear to auscultation with no wheezes or rales. Good inspiratory effort. . Abdomen: Soft nontender nondistended with normal bowel sounds x4 quadrants. . Musculoskeletal: No lower extremity edema. 2/4 pulses in all 4 extremities.  Tender right femoral thigh area patient has ice pack on it . Skin: No ulcerative lesions noted or rashes, . Psychiatry: Mood is appropriate for condition and setting    Data Reviewed: CBC: Recent Labs  Lab 03/07/20 1643 03/08/20 0338 03/08/20 1402 03/09/20 0222 03/10/20 0219 03/11/20 0255  WBC 7.9 9.8 16.2* 9.8 8.4 8.3  NEUTROABS 5.2  --   --   --   --   --   HGB 11.2* 10.0* 10.1* 8.5* 7.5* 7.3*  HCT 34.1* 28.4* 28.7* 24.0* 21.6* 22.1*  MCV 88.1 85.0 86.4 85.4 86.1 87.7  PLT 172 181 214 190 144* 0000000   Basic Metabolic Panel: Recent Labs  Lab 03/07/20 1643 03/08/20 0338 03/08/20 1402 03/09/20 0222 03/10/20 0219 03/11/20 0255  NA 135 132*  --  133* 130* 130*  K 4.2 4.1  --  4.5 4.3 3.7  CL 99 100  --  98 97* 95*  CO2 25 21*  --  27 25 24   GLUCOSE 121* 131*  --  138* 124* 129*  BUN 14 14  --  18 19 22   CREATININE 1.28* 1.24 1.38* 1.31* 1.16 1.27*  CALCIUM 8.9 9.0  --  8.4* 8.4* 8.2*  MG  --   --   --   --   --  2.1   GFR: Estimated Creatinine Clearance: 55.7 mL/min (A) (by C-G formula based on SCr of 1.27 mg/dL (H)). Liver Function Tests: Recent Labs  Lab 03/07/20 1643  AST 24  ALT 13  ALKPHOS 74  BILITOT 0.4  PROT 6.6  ALBUMIN 3.9   No results for input(s): LIPASE, AMYLASE in the last 168 hours. No results for input(s): AMMONIA in the last 168 hours. Coagulation Profile: Recent Labs  Lab 03/07/20 1643   INR 1.1   Cardiac Enzymes: No results for input(s): CKTOTAL, CKMB, CKMBINDEX, TROPONINI in the last 168 hours. BNP (last 3 results) No results for input(s): PROBNP in the last 8760 hours. HbA1C: No results for input(s): HGBA1C in the last 72 hours. CBG: No results for input(s): GLUCAP in the last 168 hours. Lipid Profile: No results for input(s): CHOL, HDL, LDLCALC, TRIG, CHOLHDL, LDLDIRECT in the last 72 hours. Thyroid Function Tests: No results for input(s): TSH, T4TOTAL, FREET4, T3FREE, THYROIDAB in the last 72 hours. Anemia Panel: No results for input(s): VITAMINB12, FOLATE, FERRITIN, TIBC, IRON, RETICCTPCT in the last 72 hours. Urine analysis:    Component Value Date/Time   COLORURINE YELLOW  10/07/2019 [place] 10/07/2019 1256   LABSPEC 1.014 10/07/2019 1256   PHURINE 7.0 10/07/2019 1256   GLUCOSEU NEGATIVE 10/07/2019 1256   HGBUR MODERATE (A) 10/07/2019 [place] 10/07/2019 [place] 10/07/2019 1256   PROTEINUR NEGATIVE 10/07/2019 1256   NITRITE NEGATIVE 10/07/2019 1256   LEUKOCYTESUR NEGATIVE 10/07/2019 1256   Sepsis Labs: @LABRCNTIP (procalcitonin:4,lacticidven:4)  ) Recent Results (from the past 240 hour(s))  Resp Panel by RT-PCR (Flu A&B, Covid) Nasopharyngeal Swab     Status: None   Collection Time: 03/07/20 11:27 PM   Specimen: Nasopharyngeal Swab; Nasopharyngeal(NP) swabs in vial transport medium  Result Value Ref Range Status   SARS Coronavirus 2 by RT PCR NEGATIVE NEGATIVE Final    Comment: (NOTE) SARS-CoV-2 target nucleic acids are NOT DETECTED.  The SARS-CoV-2 RNA is generally detectable in upper respiratory specimens during the acute phase of infection. The lowest concentration of SARS-CoV-2 viral copies this assay can detect is 138 copies/mL. A negative result does not preclude SARS-Cov-2 infection and should not be used as the sole basis for treatment or other patient management decisions. A  negative result may occur with  improper specimen collection/handling, submission of specimen other than nasopharyngeal swab, presence of viral mutation(s) within the areas targeted by this assay, and inadequate number of viral copies(<138 copies/mL). A negative result must be combined with clinical observations, patient history, and epidemiological information. The expected result is Negative.  Fact Sheet for Patients:  EntrepreneurPulse.com.au  Fact Sheet for Healthcare Providers:  IncredibleEmployment.be  This test is no t yet approved or cleared by the [place] FDA and  has been authorized for detection and/or diagnosis of SARS-CoV-2 by FDA under an Emergency Use Authorization (EUA). This EUA will remain  in effect (meaning this test can be used) for the duration of the COVID-19 declaration under Section 564(b)(1) of the Act, 21 U.S.C.section 360bbb-3(b)(1), unless the authorization is terminated  or revoked sooner.       Influenza A by PCR NEGATIVE NEGATIVE Final   Influenza B by PCR NEGATIVE NEGATIVE Final    Comment: (NOTE) The Xpert Xpress SARS-CoV-2/FLU/RSV plus assay is intended as an aid in the diagnosis of influenza from Nasopharyngeal swab specimens and should not be used as a sole basis for treatment. Nasal washings and aspirates are unacceptable for Xpert Xpress SARS-CoV-2/FLU/RSV testing.  Fact Sheet for Patients: EntrepreneurPulse.com.au  Fact Sheet for Healthcare Providers: IncredibleEmployment.be  This test is not yet approved or cleared by the [place] FDA and has been authorized for detection and/or diagnosis of SARS-CoV-2 by FDA under an Emergency Use Authorization (EUA). This EUA will remain in effect (meaning this test can be used) for the duration of the COVID-19 declaration under Section 564(b)(1) of the Act, 21 U.S.C. section 360bbb-3(b)(1), unless the authorization  is terminated or revoked.  Performed at [facility], [place] [address] [place], [place] [address]   Surgical pcr screen     Status: None   Collection Time: 03/08/20  3:28 AM   Specimen: Nasal Mucosa; Nasal Swab  Result Value Ref Range Status   MRSA, PCR NEGATIVE NEGATIVE Final   Staphylococcus aureus NEGATIVE NEGATIVE Final    Comment: (NOTE) The Xpert SA Assay (FDA approved for NASAL specimens in patients 34 years of age and older), is one component of a comprehensive surveillance program. It is not intended to diagnose infection nor to guide or monitor treatment. Performed at [facility], [place]  [address], [place], [place] [address]       Studies: No results found.  Scheduled Meds: . Chlorhexidine Gluconate Cloth  6 each Topical Daily  . cholecalciferol  2,000 Units Oral Daily  . docusate sodium  100 mg Oral BID  . enoxaparin (LOVENOX) injection  40 mg Subcutaneous Q24H  . feeding supplement  237 mL Oral BID BM  . gabapentin  300 mg Oral QID  . multivitamin with minerals  1 tablet Oral Daily  . oxyCODONE-acetaminophen  1 tablet Oral Q12H   And  . oxyCODONE  5 mg Oral Q12H  . pantoprazole  40 mg Oral Daily  . QUEtiapine  400 mg Oral QHS  . rOPINIRole  0.25 mg Oral QPM  . tamsulosin  0.4 mg Oral QPC breakfast    Continuous Infusions: . methocarbamol (ROBAXIN) IV Stopped (03/08/20 1323)     LOS: 4 days     Cristal Deer, MD [facility]  To reach me or the doctor on call, go to: [email or web address] Password [facility]  03/11/2020, 9:14 AM

## 2020-03-12 ENCOUNTER — Encounter (HOSPITAL_COMMUNITY): Payer: Self-pay | Admitting: Student

## 2020-03-12 MED ORDER — SIMETHICONE 80 MG PO CHEW
80.0000 mg | CHEWABLE_TABLET | Freq: Four times a day (QID) | ORAL | Status: DC | PRN
  Administered 2020-03-12 – 2020-03-16 (×11): 80 mg via ORAL
  Filled 2020-03-12 (×11): qty 1

## 2020-03-12 NOTE — Plan of Care (Signed)
Patient is s/p IM nailing to right femur - steri strips clean dry and intact. Foley catheter still in place due to acute urinary retention. Patient was c/o gas pains at the beginning of the shift after Miralax given so gave prn Robaxin and scheduled pain meds as ordered. No BM on my shift yet. NAD or needs voiced, will continue to monitor and continue current POC.

## 2020-03-12 NOTE — TOC Progression Note (Signed)
Transition of Care [facility]) - Progression Note    Patient Details  Name: Dustin [name] MRN: [id number] Date of Birth: 09-Jun-1940  [facility]) CM/SW Contact  Elliot Gurney [place], [place] Phone Number: [phone] 03/12/2020, 4:20 PM  Clinical Narrative:    Phone call to patient to discuss SNF selection. Patient has 2 bed offers, [facility] and [facility]. Patient selected [facility], however would like to be informed if any additional bed offers come in.  Transition of Care to continue to follow   [facility], LCSW Transition of Care [phone]    Expected Discharge Plan: [place] Barriers to Discharge: Continued Medical Work up  Expected Discharge Plan and Services Expected Discharge Plan: [place]   Discharge Planning Services: CM Consult   Living arrangements for the past 2 months: Single Family Home                                       Social Determinants of Health (SDOH) Interventions    Readmission Risk Interventions No flowsheet data found.

## 2020-03-12 NOTE — Progress Notes (Addendum)
PROGRESS NOTE  Dustin [name] XIP:[id number] DOB: 1940-09-10 DOA: 03/07/2020 PCP: Lujean Amel, MD  HPI/Recap of past 24 hours: Dustin Jansky Johnsonis a 80 y.o.malewithhistory of chronic anemia, neuropathy, chronic combined systolic and diastolic CHF, [name]  Patient did not lose any consciousness but had significant pain in the right groin area.  In the ED, X-rays revealed acute comminuted fracture of right distal femur. Labs were significant for anemia. On-call orthopedic surgeon was consulted and the patient was admitted for further management of the right thigh distal femur fracture. COVID test was negative.    Subjective: March 12, 2020 Seen and examined at bedside.  Patient had complained of gas pain  March 11, 2020: Patient seen and examined at bedside.  Complains of pain on the right femoral from his fracture .  He is status post internal fixator General and 19 2021.  Also he complains of not having bowel movement but then said he has not been eating much because he has poor appetite Also complained that at home before he even fell and had a fracture he was on pain medication: Percocet 10/325 1 tablet twice daily and oxycodone 20 mg 4 times daily and also morphine.  I reviewed his home meds , could only see the oxycodone, tramadol and the Percocet.  Currently he is on oxycodone 20 mg 4 times a day as needed, oxycodone IR 5 mg every 12 hours, Percocet 5/325 1 tablet every 12 hours And morphine 0.5 mg as needed pain   assessment/Plan: Principal Problem:   Femur fracture (HCC) Active Problems:   Peripheral axonal neuropathy   Hip fracture (HCC)  #1 right hip fracture status post internal fixator waiting for rehab  2.  Chronic pain on long-term opiate therapy  3.  Constipation likely from heavy dose opioid use.  He is already on MiraLAX daily  as needed and docusate sodium 1 tablet twice daily.  I will change MiraLAX to twice daily  4.  Chronic neuropathy patient is on gabapentin this will also help with his pain   Code Status: DNR  Severity of Illness: The appropriate patient status for this patient is INPATIENT. Inpatient status is judged to be reasonable and necessary in order to provide the required intensity of service to ensure the patient's safety. The patient's presenting symptoms, physical exam findings, and initial radiographic and laboratory data in the context of their chronic comorbidities is felt to place them at high risk for further clinical deterioration. Furthermore, it is not anticipated that the patient will be medically stable for discharge from the hospital within 2 midnights of admission. The following factors support the patient status of inpatient.   " The patient's presenting symptoms include fall with fracture. " The worrisome physical exam findings include fall with fracture. " The initial radiographic and laboratory data are worrisome because of requesting and waiting for rehab. " The chronic co-morbidities include chronic pain.   * I certify that at the point of admission it is my clinical judgment that the patient will require inpatient hospital care spanning beyond 2 midnights from the point of admission due to high intensity of service, high risk for further deterioration and high frequency of surveillance required.*    Family Communication: Patient  Disposition Plan: Rehab   Consultants:  Orthopedic  Procedures:  Right femur internal fixator  Antimicrobials:  None  DVT prophylaxis: Lovenox   Objective: Vitals:   03/11/20 0850 03/11/20 1521 03/11/20 2011 03/12/20 0451  BP: 114/68 115/63 125/62 119/63  Pulse: 94 (!) 103 89 90  Resp: 17 17 16 17   Temp: 98.6 F (37 C) 98.1 F (36.7 C) 98.8 F (37.1 C) 98.7 F (37.1 C)  TempSrc: Oral Oral Oral Oral  SpO2: 100%  97% 100%   Weight:      Height:        Intake/Output Summary (Last 24 hours) at 03/12/2020 0900 Last data filed at 03/11/2020 2300 Gross per 24 hour  Intake 340 ml  Output 650 ml  Net -310 ml   Filed Weights   03/07/20 1654 03/08/20 0245  Weight: 91.6 kg 94.5 kg   Body mass index is 26.75 kg/m.  Exam:  . General: 80 y.o. year-old male well developed well nourished in no acute distress.  Alert and oriented x3.  Pleasant . Cardiovascular: Regular rate and rhythm with no rubs or gallops.  No thyromegaly or JVD noted.   Marland Kitchen Respiratory: Clear to auscultation with no wheezes or rales. Good inspiratory effort. . Abdomen: Soft nontender nondistended with normal bowel sounds x4 quadrants. . Musculoskeletal: No lower extremity edema. 2/4 pulses in all 4 extremities.  Tender right femoral thigh area patient has ice pack on it . Skin: No ulcerative lesions noted or rashes, . Psychiatry: Mood is appropriate for condition and setting    Data Reviewed: CBC: Recent Labs  Lab 03/07/20 1643 03/08/20 0338 03/08/20 1402 03/09/20 0222 03/10/20 0219 03/11/20 0255  WBC 7.9 9.8 16.2* 9.8 8.4 8.3  NEUTROABS 5.2  --   --   --   --   --   HGB 11.2* 10.0* 10.1* 8.5* 7.5* 7.3*  HCT 34.1* 28.4* 28.7* 24.0* 21.6* 22.1*  MCV 88.1 85.0 86.4 85.4 86.1 87.7  PLT 172 181 214 190 144* 0000000   Basic Metabolic Panel: Recent Labs  Lab 03/07/20 1643 03/08/20 0338 03/08/20 1402 03/09/20 0222 03/10/20 0219 03/11/20 0255  NA 135 132*  --  133* 130* 130*  K 4.2 4.1  --  4.5 4.3 3.7  CL 99 100  --  98 97* 95*  CO2 25 21*  --  27 25 24   GLUCOSE 121* 131*  --  138* 124* 129*  BUN 14 14  --  18 19 22   CREATININE 1.28* 1.24 1.38* 1.31* 1.16 1.27*  CALCIUM 8.9 9.0  --  8.4* 8.4* 8.2*  MG  --   --   --   --   --  2.1   GFR: Estimated Creatinine Clearance: 55.7 mL/min (A) (by C-G formula based on SCr of 1.27 mg/dL (H)). Liver Function Tests: Recent Labs  Lab 03/07/20 1643  AST 24  ALT 13  ALKPHOS 74  BILITOT  0.4  PROT 6.6  ALBUMIN 3.9   No results for input(s): LIPASE, AMYLASE in the last 168 hours. No results for input(s): AMMONIA in the last 168 hours. Coagulation Profile: Recent Labs  Lab 03/07/20 1643  INR 1.1   Cardiac Enzymes: No results for input(s): CKTOTAL, CKMB, CKMBINDEX, TROPONINI in the last 168 hours. BNP (last 3 results) No results for input(s): PROBNP in the last 8760 hours. HbA1C: No results for input(s): HGBA1C in the last 72 hours. CBG: No results for input(s): GLUCAP in the last 168 hours. Lipid Profile: No results for input(s): CHOL, HDL, LDLCALC, TRIG, CHOLHDL, LDLDIRECT in the last 72 hours. Thyroid Function Tests: No results for  input(s): TSH, T4TOTAL, FREET4, T3FREE, THYROIDAB in the last 72 hours. Anemia Panel: No results for input(s): VITAMINB12, FOLATE, FERRITIN, TIBC, IRON, RETICCTPCT in the last 72 hours. Urine analysis:    Component Value Date/Time   COLORURINE YELLOW 10/07/2019 [place] 10/07/2019 1256   LABSPEC 1.014 10/07/2019 1256   PHURINE 7.0 10/07/2019 1256   GLUCOSEU NEGATIVE 10/07/2019 1256   HGBUR MODERATE (A) 10/07/2019 1256   BILIRUBINUR NEGATIVE 10/07/2019 1256   KETONESUR NEGATIVE 10/07/2019 1256   PROTEINUR NEGATIVE 10/07/2019 1256   NITRITE NEGATIVE 10/07/2019 1256   LEUKOCYTESUR NEGATIVE 10/07/2019 1256   Sepsis Labs: @LABRCNTIP (procalcitonin:4,lacticidven:4)  ) Recent Results (from the past 240 hour(s))  Resp Panel by RT-PCR (Flu A&B, Covid) Nasopharyngeal Swab     Status: None   Collection Time: 03/07/20 11:27 PM   Specimen: Nasopharyngeal Swab; Nasopharyngeal(NP) swabs in vial transport medium  Result Value Ref Range Status   SARS Coronavirus 2 by RT PCR NEGATIVE NEGATIVE Final    Comment: (NOTE) SARS-CoV-2 target nucleic acids are NOT DETECTED.  The SARS-CoV-2 RNA is generally detectable in upper respiratory specimens during the acute phase of infection. The lowest concentration of SARS-CoV-2  viral copies this assay can detect is 138 copies/mL. A negative result does not preclude SARS-Cov-2 infection and should not be used as the sole basis for treatment or other patient management decisions. A negative result may occur with  improper specimen collection/handling, submission of specimen other than nasopharyngeal swab, presence of viral mutation(s) within the areas targeted by this assay, and inadequate number of viral copies(<138 copies/mL). A negative result must be combined with clinical observations, patient history, and epidemiological information. The expected result is Negative.  Fact Sheet for Patients:  EntrepreneurPulse.com.au  Fact Sheet for Healthcare Providers:  IncredibleEmployment.be  This test is no t yet approved or cleared by the [place] FDA and  has been authorized for detection and/or diagnosis of SARS-CoV-2 by FDA under an Emergency Use Authorization (EUA). This EUA will remain  in effect (meaning this test can be used) for the duration of the COVID-19 declaration under Section 564(b)(1) of the Act, 21 U.S.C.section 360bbb-3(b)(1), unless the authorization is terminated  or revoked sooner.       Influenza A by PCR NEGATIVE NEGATIVE Final   Influenza B by PCR NEGATIVE NEGATIVE Final    Comment: (NOTE) The Xpert Xpress SARS-CoV-2/FLU/RSV plus assay is intended as an aid in the diagnosis of influenza from Nasopharyngeal swab specimens and should not be used as a sole basis for treatment. Nasal washings and aspirates are unacceptable for Xpert Xpress SARS-CoV-2/FLU/RSV testing.  Fact Sheet for Patients: EntrepreneurPulse.com.au  Fact Sheet for Healthcare Providers: IncredibleEmployment.be  This test is not yet approved or cleared by the [place] FDA and has been authorized for detection and/or diagnosis of SARS-CoV-2 by FDA under an Emergency Use Authorization  (EUA). This EUA will remain in effect (meaning this test can be used) for the duration of the COVID-19 declaration under Section 564(b)(1) of the Act, 21 U.S.C. section 360bbb-3(b)(1), unless the authorization is terminated or revoked.  Performed at [facility], [place] [address] [place], [place] [address]   Surgical pcr screen     Status: None   Collection Time: 03/08/20  3:28 AM   Specimen: Nasal Mucosa; Nasal Swab  Result Value Ref Range Status   MRSA, PCR NEGATIVE NEGATIVE Final   Staphylococcus aureus NEGATIVE NEGATIVE Final    Comment: (NOTE) The Xpert SA Assay (FDA approved for  NASAL specimens in patients 25 years of age and older), is one component of a comprehensive surveillance program. It is not intended to diagnose infection nor to guide or monitor treatment. Performed at [facility], [place] [address], [place], [place] [address]       Studies: No results found.  Scheduled Meds: . Chlorhexidine Gluconate Cloth  6 each Topical Daily  . cholecalciferol  2,000 Units Oral Daily  . docusate sodium  100 mg Oral BID  . enoxaparin (LOVENOX) injection  40 mg Subcutaneous Q24H  . feeding supplement  237 mL Oral BID BM  . gabapentin  300 mg Oral QID  . multivitamin with minerals  1 tablet Oral Daily  . oxyCODONE-acetaminophen  1 tablet Oral Q12H   And  . oxyCODONE  5 mg Oral Q12H  . pantoprazole  40 mg Oral Daily  . polyethylene glycol  17 g Oral BID  . QUEtiapine  400 mg Oral QHS  . rOPINIRole  0.25 mg Oral QPM  . tamsulosin  0.4 mg Oral QPC breakfast    Continuous Infusions: . methocarbamol (ROBAXIN) IV Stopped (03/08/20 1323)     LOS: 5 days     Cristal Deer, MD [facility]  To reach me or the doctor on call, go to: [email or web address] Password [email or web address]  03/12/2020, 9:00 AM

## 2020-03-13 ENCOUNTER — Inpatient Hospital Stay (HOSPITAL_COMMUNITY): Payer: Medicare Other

## 2020-03-13 MED ORDER — BISACODYL 10 MG RE SUPP: 10.0000 mg | Freq: Once | RECTAL | Status: DC

## 2020-03-13 MED ORDER — SODIUM CHLORIDE 0.9 % IV BOLUS
500.0000 mL | Freq: Once | INTRAVENOUS | Status: AC
  Administered 2020-03-13: 500 mL via INTRAVENOUS

## 2020-03-13 MED ORDER — BISACODYL 10 MG RE SUPP
10.0000 mg | Freq: Every day | RECTAL | Status: DC | PRN
  Administered 2020-03-13: 10 mg via RECTAL
  Filled 2020-03-13: qty 1

## 2020-03-13 MED ORDER — LACTULOSE 10 GM/15ML PO SOLN
30.0000 g | Freq: Once | ORAL | Status: AC
  Administered 2020-03-13: 30 g via ORAL
  Filled 2020-03-13: qty 45

## 2020-03-13 NOTE — Plan of Care (Signed)
No acute events since the previous night that I took care of him. Patient still waiting for SNF approval - agrees to [facility]. Will continue to monitor and continue current POC.

## 2020-03-13 NOTE — Plan of Care (Signed)
  Problem: Activity: Goal: Risk for activity intolerance will decrease Outcome: Progressing   Problem: Elimination: Goal: Will not experience complications related to bowel motility Outcome: Not Progressing   Problem: Pain Managment: Goal: General experience of comfort will improve Outcome: Progressing

## 2020-03-13 NOTE — Progress Notes (Signed)
PROGRESS NOTE  Dustin [name] MGQ:[id number] DOB: 1941/02/03 DOA: 03/07/2020 PCP: Lujean Amel, MD  HPI/Recap of past 24 hours: Dustin Jansky Johnsonis a 80 y.o.malewithhistory of chronic anemia, neuropathy, chronic combined systolic and diastolic CHF, last EF of  40 to 45% with grade 1 diastolic dysfunction in 2671 presented to the hospital after having a fall after slipping on the ice.  Patient did not lose any consciousness but had significant pain in the right groin area.  In the ED, X-rays revealed acute comminuted fracture of right distal femur. Labs were significant for anemia. On-call orthopedic surgeon was consulted and the patient was admitted for further management of the right thigh distal femur fracture. COVID test was negative.    Subjective: March 13, 2020: Seen and examined at bedside.  Patient complained of dizziness when physical therapy came to see him and he was noted to have low blood pressure pressure. Nurse complained he has not had any bowel movement for 6 days now despite MiraLAX and Colace, also complaining of gas pain  March 12, 2020 Seen and examined at bedside.  Patient had complained of gas pain still no BM  March 11, 2020: Patient seen and examined at bedside.  Complains of pain on the right femoral from his fracture .  He is status post internal fixator General and 19 2021.  Also he complains of not having bowel movement but then said he has not been eating much because he has poor appetite Also complained that at home before he even fell and had a fracture he was on pain medication: Percocet 10/325 1 tablet twice daily and oxycodone 20 mg 4 times daily and also morphine.  I reviewed his home meds , could only see the oxycodone, tramadol and the Percocet.  Currently he is on oxycodone 20 mg 4 times a day as needed, oxycodone IR 5 mg every 12 hours, Percocet 5/325 1 tablet every 12 hours And morphine 0.5 mg as needed pain    assessment/Plan: Principal Problem:   Femur fracture (HCC) Active Problems:   Peripheral axonal neuropathy   Hip fracture (HCC)  #1 right hip fracture status post internal fixator waiting for rehab  2.  Chronic pain on long-term opiate therapy  3.  Constipation likely from heavy dose opioid use.  He is already on MiraLAX daily as needed and docusate sodium 1 tablet twice daily.  I will change MiraLAX to twice daily I have ordered Dulcolax suppository this morning with no improvement.  I have added lactulose x1 tonight.  Also x-ray of the abdomen  4.  Chronic neuropathy patient is on gabapentin this will also help with his pain  5.  Orthostatic hypotension.  Patient had hypotension during physical therapy with dizziness orthostatic blood pressure was nonaerated was low.  He was bolused with IV 500 cc normal saline his blood pressure is better.  Code Status: DNR  Severity of Illness: The appropriate patient status for this patient is INPATIENT. Inpatient status is judged to be reasonable and necessary in order to provide the required intensity of service to ensure the patient's safety. The patient's presenting symptoms, physical exam findings, and initial radiographic and laboratory data in the context of their chronic comorbidities is felt to place them at high risk for further clinical deterioration. Furthermore, it is not anticipated that the patient will be medically stable for discharge from the hospital within 2 midnights of admission. The following factors support the patient status of inpatient.   " The patient's  presenting symptoms include fall with fracture. " The worrisome physical exam findings include fall with fracture. " The initial radiographic and laboratory data are worrisome because of requesting and waiting for rehab. " The chronic co-morbidities include chronic pain.   * I certify that at the point of admission it is my clinical judgment that the patient will require  inpatient hospital care spanning beyond 2 midnights from the point of admission due to high intensity of service, high risk for further deterioration and high frequency of surveillance required.*    Family Communication: Patient  Disposition Plan: Rehab   Consultants:  Orthopedic  Procedures:  Right femur internal fixator  Antimicrobials:  None  DVT prophylaxis: Lovenox   Objective: Vitals:   03/12/20 1500 03/12/20 1948 03/13/20 0331 03/13/20 0903  BP: (!) 116/54 105/77 102/63 113/61  Pulse: 100 (!) 108 (!) 103 98  Resp: 15 17 15 17   Temp: 98.9 F (37.2 C) 99.3 F (37.4 C) 97.8 F (36.6 C) 99.4 F (37.4 C)  TempSrc: Oral Oral Oral Oral  SpO2: 99% 92% 98% 95%  Weight:      Height:        Intake/Output Summary (Last 24 hours) at 03/13/2020 0928 Last data filed at 03/13/2020 0500 Gross per 24 hour  Intake --  Output 800 ml  Net -800 ml   Filed Weights   03/07/20 1654 03/08/20 0245  Weight: 91.6 kg 94.5 kg   Body mass index is 26.75 kg/m.  PER PT:03/13/20 1005 03/13/20 1006 03/13/20 1008 Vital Signs Patient Position (if appropriate) Orthostatic Vitals -- -- Orthostatic Lying BP- Lying 113/61 99/64 (HOB elevated; Map 75) 106/53 (HOB to 30 deg; Map 69) Pulse- Lying 98 95 89 Orthostatic Sitting BP- Sitting (!) 82/65 (Dizzy; Map 73) (!) 75/45 (bed to semi-chair, HOB elevated to 50 deg; Map 51) -- Pulse- Sitting 64 70 -- Orthostatic Standing at 0 minutes BP- Standing at 0 minutes (Multiple attempts semi-standing in stedy; Unabler to tolerate; difficulty relaxing arm, so BPs in late 200s/100s, suspect inaccurate; Very dizzy) -- --    Exam:  . General: 80 y.o. year-old male well developed well nourished in no acute distress.  Alert and oriented x3.  Pleasant . Cardiovascular: Regular rate and rhythm with no rubs or gallops.  No thyromegaly or JVD noted.   Marland Kitchen Respiratory: Clear to auscultation with no wheezes or rales. Good inspiratory effort. . Abdomen: Soft mildly  tender nondistended with reduced bowel sounds x4 quadrants. . Musculoskeletal: No lower extremity edema. 2/4 pulses in all 4 extremities.  Tender right femoral thigh area patient has ice pack on it . Skin: No ulcerative lesions noted or rashes, . Psychiatry: Mood is appropriate for condition and setting    Data Reviewed: CBC: Recent Labs  Lab 03/07/20 1643 03/08/20 0338 03/08/20 1402 03/09/20 0222 03/10/20 0219 03/11/20 0255  WBC 7.9 9.8 16.2* 9.8 8.4 8.3  NEUTROABS 5.2  --   --   --   --   --   HGB 11.2* 10.0* 10.1* 8.5* 7.5* 7.3*  HCT 34.1* 28.4* 28.7* 24.0* 21.6* 22.1*  MCV 88.1 85.0 86.4 85.4 86.1 87.7  PLT 172 181 214 190 144* 0000000   Basic Metabolic Panel: Recent Labs  Lab 03/07/20 1643 03/08/20 0338 03/08/20 1402 03/09/20 0222 03/10/20 0219 03/11/20 0255  NA 135 132*  --  133* 130* 130*  K 4.2 4.1  --  4.5 4.3 3.7  CL 99 100  --  98 97* 95*  CO2 25 21*  --  27 25 24   GLUCOSE 121* 131*  --  138* 124* 129*  BUN 14 14  --  18 19 22   CREATININE 1.28* 1.24 1.38* 1.31* 1.16 1.27*  CALCIUM 8.9 9.0  --  8.4* 8.4* 8.2*  MG  --   --   --   --   --  2.1   GFR: Estimated Creatinine Clearance: 55.7 mL/min (A) (by C-G formula based on SCr of 1.27 mg/dL (H)). Liver Function Tests: Recent Labs  Lab 03/07/20 1643  AST 24  ALT 13  ALKPHOS 74  BILITOT 0.4  PROT 6.6  ALBUMIN 3.9   No results for input(s): LIPASE, AMYLASE in the last 168 hours. No results for input(s): AMMONIA in the last 168 hours. Coagulation Profile: Recent Labs  Lab 03/07/20 1643  INR 1.1   Cardiac Enzymes: No results for input(s): CKTOTAL, CKMB, CKMBINDEX, TROPONINI in the last 168 hours. BNP (last 3 results) No results for input(s): PROBNP in the last 8760 hours. HbA1C: No results for input(s): HGBA1C in the last 72 hours. CBG: No results for input(s): GLUCAP in the last 168 hours. Lipid Profile: No results for input(s): CHOL, HDL, LDLCALC, TRIG, CHOLHDL, LDLDIRECT in the last 72  hours. Thyroid Function Tests: No results for input(s): TSH, T4TOTAL, FREET4, T3FREE, THYROIDAB in the last 72 hours. Anemia Panel: No results for input(s): VITAMINB12, FOLATE, FERRITIN, TIBC, IRON, RETICCTPCT in the last 72 hours. Urine analysis:    Component Value Date/Time   COLORURINE YELLOW 10/07/2019 [place] 10/07/2019 1256   LABSPEC 1.014 10/07/2019 1256   PHURINE 7.0 10/07/2019 1256   GLUCOSEU NEGATIVE 10/07/2019 1256   HGBUR MODERATE (A) 10/07/2019 1256   BILIRUBINUR NEGATIVE 10/07/2019 1256   KETONESUR NEGATIVE 10/07/2019 1256   PROTEINUR NEGATIVE 10/07/2019 1256   NITRITE NEGATIVE 10/07/2019 1256   LEUKOCYTESUR NEGATIVE 10/07/2019 1256   Sepsis Labs: @LABRCNTIP (procalcitonin:4,lacticidven:4)  ) Recent Results (from the past 240 hour(s))  Resp Panel by RT-PCR (Flu A&B, Covid) Nasopharyngeal Swab     Status: None   Collection Time: 03/07/20 11:27 PM   Specimen: Nasopharyngeal Swab; Nasopharyngeal(NP) swabs in vial transport medium  Result Value Ref Range Status   SARS Coronavirus 2 by RT PCR NEGATIVE NEGATIVE Final    Comment: (NOTE) SARS-CoV-2 target nucleic acids are NOT DETECTED.  The SARS-CoV-2 RNA is generally detectable in upper respiratory specimens during the acute phase of infection. The lowest concentration of SARS-CoV-2 viral copies this assay can detect is 138 copies/mL. A negative result does not preclude SARS-Cov-2 infection and should not be used as the sole basis for treatment or other patient management decisions. A negative result may occur with  improper specimen collection/handling, submission of specimen other than nasopharyngeal swab, presence of viral mutation(s) within the areas targeted by this assay, and inadequate number of viral copies(<138 copies/mL). A negative result must be combined with clinical observations, patient history, and epidemiological information. The expected result is Negative.  Fact Sheet for  Patients:  EntrepreneurPulse.com.au  Fact Sheet for Healthcare Providers:  IncredibleEmployment.be  This test is no t yet approved or cleared by the [place] FDA and  has been authorized for detection and/or diagnosis of SARS-CoV-2 by FDA under an Emergency Use Authorization (EUA). This EUA will remain  in effect (meaning this test can be used) for the duration of the COVID-19 declaration under Section 564(b)(1) of the Act, 21 U.S.C.section 360bbb-3(b)(1), unless the authorization is terminated  or revoked sooner.       Influenza  A by PCR NEGATIVE NEGATIVE Final   Influenza B by PCR NEGATIVE NEGATIVE Final    Comment: (NOTE) The Xpert Xpress SARS-CoV-2/FLU/RSV plus assay is intended as an aid in the diagnosis of influenza from Nasopharyngeal swab specimens and should not be used as a sole basis for treatment. Nasal washings and aspirates are unacceptable for Xpert Xpress SARS-CoV-2/FLU/RSV testing.  Fact Sheet for Patients: EntrepreneurPulse.com.au  Fact Sheet for Healthcare Providers: IncredibleEmployment.be  This test is not yet approved or cleared by the [place] FDA and has been authorized for detection and/or diagnosis of SARS-CoV-2 by FDA under an Emergency Use Authorization (EUA). This EUA will remain in effect (meaning this test can be used) for the duration of the COVID-19 declaration under Section 564(b)(1) of the Act, 21 U.S.C. section 360bbb-3(b)(1), unless the authorization is terminated or revoked.  Performed at [facility], [place] [address], [place], [place] [address]   Surgical pcr screen     Status: None   Collection Time: 03/08/20  3:28 AM   Specimen: Nasal Mucosa; Nasal Swab  Result Value Ref Range Status   MRSA, PCR NEGATIVE NEGATIVE Final   Staphylococcus aureus NEGATIVE NEGATIVE Final    Comment: (NOTE) The Xpert SA Assay (FDA approved for NASAL specimens in  patients 69 years of age and older), is one component of a comprehensive surveillance program. It is not intended to diagnose infection nor to guide or monitor treatment. Performed at [facility], [place] [address] [place], [place] [address]       Studies: No results found.  Scheduled Meds: . Chlorhexidine Gluconate Cloth  6 each Topical Daily  . cholecalciferol  2,000 Units Oral Daily  . docusate sodium  100 mg Oral BID  . enoxaparin (LOVENOX) injection  40 mg Subcutaneous Q24H  . feeding supplement  237 mL Oral BID BM  . gabapentin  300 mg Oral QID  . multivitamin with minerals  1 tablet Oral Daily  . oxyCODONE-acetaminophen  1 tablet Oral Q12H   And  . oxyCODONE  5 mg Oral Q12H  . pantoprazole  40 mg Oral Daily  . polyethylene glycol  17 g Oral BID  . QUEtiapine  400 mg Oral QHS  . rOPINIRole  0.25 mg Oral QPM  . tamsulosin  0.4 mg Oral QPC breakfast    Continuous Infusions: . methocarbamol (ROBAXIN) IV Stopped (03/08/20 1323)     LOS: 6 days     Cristal Deer, MD [facility]  To reach me or the doctor on call, go to: [email or web address] Password [facility]  03/13/2020, 9:28 AM

## 2020-03-13 NOTE — Progress Notes (Addendum)
Physical Therapy Note  Took Serial BPs during session, pt very dizzy with attempts at upright activity;     03/13/20 1005 03/13/20 1006 03/13/20 1008  Vital Signs  Patient Position (if appropriate) Orthostatic Vitals  --   --   Orthostatic Lying   BP- Lying 113/61 99/64 (HOB elevated; Map 75) 106/53 (HOB to 30 deg; Map 69)  Pulse- Lying 98 95 89  Orthostatic Sitting  BP- Sitting (!) 82/65 (Dizzy; Map 73) (!) 75/45 (bed to semi-chair, HOB elevated to 50 deg; Map 51)  --   Pulse- Sitting 64 70  --   Orthostatic Standing at 0 minutes  BP- Standing at 0 minutes  (Multiple attempts semi-standing in stedy; Unabler to tolerate; difficulty relaxing arm, so BPs in late 200s/100s, suspect inaccurate; Very dizzy)  --   --    Sharyn Lull, RN aware; Notified Dr. Kyung Bacca;  Full PT treatment note to follow;   Roney Marion, [place] Pager [phone] Office [phone]

## 2020-03-13 NOTE — Progress Notes (Signed)
Physical Therapy Treatment Patient Details Name: Dustin [name] MRN: [id number] DOB: March 08, 1940 Today's Date: 03/13/2020    History of Present Illness 80 y.o. male with history of chronic anemia, neuropathy, chronic combined systolic and diastolic CHF last EF measured was 40 to 45% presented to ED 03/07/20 after a fall at home after slipping on ice.  Did not lose consciousness. +comminuted fracture of distal right femur. 03/08/20 intramedullary nailing of right femur fracture    PT Comments    Continuing work on functional mobility and activity tolerance;  Session focused on assessing response to activity, transfers;  Also worked to obtain serial BPs (see other note of this date); Notably dizzy and anxious with upright activity, coupled with BP drops in sitting, and in bed with bed in semi-chair position (HOB 50 deg elevated); Unable to get reliable BPs in standing, however I conjecture they were hypotensive due to symptomatology; Notified Dr. Kyung Bacca and Sharyn Lull, RN  Follow Up Recommendations  SNF     Equipment Recommendations  Rolling walker with 5" wheels;3in1 (PT)    Recommendations for Other Services       Precautions / Restrictions Precautions Precautions: Fall Precaution Comments: Check orthostatics next session Restrictions RLE Weight Bearing: Weight bearing as tolerated Other Position/Activity Restrictions: Very painful and does not tolerate much weight through RLE    Mobility  Bed Mobility Overal bed mobility: Needs Assistance Bed Mobility: Supine to Sit;Sit to Supine     Supine to sit: Mod assist Sit to supine: Max assist   General bed mobility comments: Light mod assist to support RLE moving toward the EOB, and coming off EOB;  mod assist to help get to fully upright sitting, and square off hips at [facility] cueing for technique. HOB elevated with increased time and effort and use of bed rails.  Transfers Overall transfer level: Needs assistance   Transfers: Sit  to/from Stand Sit to Stand: Mod assist;From elevated surface;+2 physical assistance         General transfer comment: Used Stedy for trials of sit to stand today; good pull up on the bar in front, however unable to get to fully upright standing; Heavy mod assist to power up; very painful, and pt reporting dizziness  Ambulation/Gait             General Gait Details: Unable   Stairs             Wheelchair Mobility    Modified Rankin (Stroke Patients Only)       Balance     Sitting balance-Leahy Scale: Poor       Standing balance-Leahy Scale: Zero                              Cognition Arousal/Alertness: Awake/alert Behavior During Therapy: Anxious Overall Cognitive Status: Within Functional Limits for tasks assessed                                 General Comments: became anxious related to pain and movement, required encouragement for EOB activity.      Exercises      General Comments General comments (skin integrity, edema, etc.): Session focused on gettign Orthostatic BPs; see other note of this date      Pertinent Vitals/Pain Pain Assessment: 0-10 Pain Score: 7  (7.5) Pain Location: R knee Pain Descriptors / Indicators: Operative site guarding;Grimacing;Guarding;Discomfort Pain Intervention(s): Monitored  during session;Premedicated before session    Home Living                      Prior Function            PT Goals (current goals can now be found in the care plan section) Acute Rehab PT Goals Patient Stated Goal: return home; get back to his wife PT Goal Formulation: With patient Time For Goal Achievement: 03/23/20 Potential to Achieve Goals: Good Progress towards PT goals: Progressing toward goals (slowly, limited by orthostatic hypotension)    Frequency    Min 2X/week      PT Plan Discharge plan needs to be updated;Frequency needs to be updated    Co-evaluation               AM-PAC PT "6 Clicks" Mobility   Outcome Measure  Help needed turning from your back to your side while in a flat bed without using bedrails?: A Lot Help needed moving from lying on your back to sitting on the side of a flat bed without using bedrails?: A Lot Help needed moving to and from a bed to a chair (including a wheelchair)?: Total Help needed standing up from a chair using your arms (e.g., wheelchair or bedside chair)?: Total Help needed to walk in hospital room?: Total Help needed climbing 3-5 steps with a railing? : Total 6 Click Score: 8    End of Session Equipment Utilized During Treatment: Gait belt Activity Tolerance: Patient limited by pain;Treatment limited secondary to medical complications (Comment) (hypotension) Patient left: in bed;with call bell/phone within reach;with bed alarm set Nurse Communication: Mobility status;Other (comment) (Hypotension) PT Visit Diagnosis: Other abnormalities of gait and mobility (R26.89);History of falling (Z91.81);Pain Pain - Right/Left: Right Pain - part of body: Knee     Time: 0909-1000 PT Time Calculation (min) (ACUTE ONLY): 51 min  Charges:  $Therapeutic Activity: 38-52 mins                     Roney Marion, PT  Acute Rehabilitation Services Pager [phone] Office [phone]    [place] 03/13/2020, 10:31 AM

## 2020-03-14 DIAGNOSIS — K567 Ileus, unspecified: Secondary | ICD-10-CM

## 2020-03-14 DIAGNOSIS — E861 Hypovolemia: Secondary | ICD-10-CM

## 2020-03-14 DIAGNOSIS — I9589 Other hypotension: Secondary | ICD-10-CM

## 2020-03-14 LAB — CBC WITH DIFFERENTIAL/PLATELET
Abs Immature Granulocytes: 0.11 10*3/uL — ABNORMAL HIGH (ref 0.00–0.07)
Basophils Absolute: 0 10*3/uL (ref 0.0–0.1)
Basophils Relative: 0 %
Eosinophils Absolute: 0.2 10*3/uL (ref 0.0–0.5)
Eosinophils Relative: 2 %
HCT: 21.4 % — ABNORMAL LOW (ref 39.0–52.0)
Hemoglobin: 7 g/dL — ABNORMAL LOW (ref 13.0–17.0)
Immature Granulocytes: 2 %
Lymphocytes Relative: 19 %
Lymphs Abs: 1.4 10*3/uL (ref 0.7–4.0)
MCH: 29.2 pg (ref 26.0–34.0)
MCHC: 32.7 g/dL (ref 30.0–36.0)
MCV: 89.2 fL (ref 80.0–100.0)
Monocytes Absolute: 0.8 10*3/uL (ref 0.1–1.0)
Monocytes Relative: 11 %
Neutro Abs: 4.8 10*3/uL (ref 1.7–7.7)
Neutrophils Relative %: 66 %
Platelets: 228 10*3/uL (ref 150–400)
RBC: 2.4 MIL/uL — ABNORMAL LOW (ref 4.22–5.81)
RDW: 13.8 % (ref 11.5–15.5)
WBC: 7.3 10*3/uL (ref 4.0–10.5)
nRBC: 0 % (ref 0.0–0.2)

## 2020-03-14 LAB — BASIC METABOLIC PANEL
Anion gap: 11 (ref 5–15)
BUN: 31 mg/dL — ABNORMAL HIGH (ref 8–23)
CO2: 25 mmol/L (ref 22–32)
Calcium: 8.2 mg/dL — ABNORMAL LOW (ref 8.9–10.3)
Chloride: 95 mmol/L — ABNORMAL LOW (ref 98–111)
Creatinine, Ser: 1.25 mg/dL — ABNORMAL HIGH (ref 0.61–1.24)
GFR, Estimated: 59 mL/min — ABNORMAL LOW (ref >60)
Glucose, Bld: 125 mg/dL — ABNORMAL HIGH (ref 70–99)
Potassium: 4.4 mmol/L (ref 3.5–5.1)
Sodium: 131 mmol/L — ABNORMAL LOW (ref 135–145)

## 2020-03-14 MED ORDER — LACTATED RINGERS IV SOLN: INTRAVENOUS | Status: DC

## 2020-03-14 NOTE — Plan of Care (Signed)
  Problem: Health Behavior/Discharge Planning: Goal: Ability to manage health-related needs will improve Outcome: Not Progressing   Problem: Activity: Goal: Risk for activity intolerance will decrease Outcome: Not Progressing   

## 2020-03-14 NOTE — Progress Notes (Signed)
Physical Therapy Note  Dizziness with upright activity and standing trials persists;  Serial BPs as follows:     03/14/20 0940 03/14/20 0950  Orthostatic Lying   BP- Lying 130/68 119/69 (Map 84; Hob elevated approx 30 deg)  Pulse- Lying 108 117  Orthostatic Sitting  BP- Sitting 101/62 (Map 75)  --   Pulse- Sitting 139  --   Orthostatic Standing at 0 minutes  BP- Standing at 0 minutes (!) 68/45 (Map 53; started BP standing; unable to stay standing long enough to complete BP reading; sat to EOB, and then got this BP reading)  --   Pulse- Standing at 0 minutes 175  --    Full PT note to follow;   Roney Marion, [place] Pager [phone] Office [phone]

## 2020-03-14 NOTE — Progress Notes (Signed)
PROGRESS NOTE  KIVON APREA ZGY:[id number] DOB: 25-Feb-1940 DOA: 03/07/2020 PCP: Lujean Amel, MD  HPI/Recap of past 24 hours: Dustin Jansky Johnsonis a 80 y.o.malewithhistory of chronic anemia, neuropathy, chronic combined systolic and diastolic CHF, last EF of  40 to 45% with grade 1 diastolic dysfunction in 5916 presented to the hospital after having a fall after slipping on the ice.  Patient did not lose any consciousness but had significant pain in the right groin area.  In the ED, X-rays revealed acute comminuted fracture of right distal femur. Labs were significant for anemia. On-call orthopedic surgeon was consulted and the patient was admitted for further management of the right thigh distal femur fracture. COVID test was negative.    Subjective:  March 14, 2020. Patient seen and examined at bedside.  He was complaining of increased gas and pain and requesting for more gas pills.  X-ray was ordered and it shows some mild ileus.  Also he had another episode of dizziness and hypotension during physical therapy today  March 13, 2020: Seen and examined at bedside.  Patient complained of dizziness when physical therapy came to see him and he was noted to have low blood pressure pressure. Nurse complained he has not had any bowel movement for 6 days now despite MiraLAX and Colace, also complaining of gas pain  March 12, 2020 Seen and examined at bedside.  Patient had complained of gas pain still no BM  March 11, 2020: Patient seen and examined at bedside.  Complains of pain on the right femoral from his fracture .  He is status post internal fixator General and 19 2021.  Also he complains of not having bowel movement but then said he has not been eating much because he has poor appetite Also complained that at home before he even fell and had a fracture he was on pain medication: Percocet 10/325 1 tablet twice daily and oxycodone 20 mg 4 times daily and also morphine.  I  reviewed his home meds , could only see the oxycodone, tramadol and the Percocet.  Currently he is on oxycodone 20 mg 4 times a day as needed, oxycodone IR 5 mg every 12 hours, Percocet 5/325 1 tablet every 12 hours And morphine 0.5 mg as needed pain   assessment/Plan: Principal Problem:   Femur fracture (HCC) Active Problems:   Peripheral axonal neuropathy   Hip fracture (HCC)  #1 right hip fracture status post internal fixator waiting for rehab  2.  Chronic pain on long-term opiate therapy  3.  Constipation likely from heavy dose opioid use.  He is already on MiraLAX daily as needed and docusate sodium 1 tablet twice daily.  I will change MiraLAX to twice daily I have ordered Dulcolax suppository this morning with no improvement.  I have added lactulose x1 tonight.  Also x-ray of the abdomen  4.  Chronic neuropathy patient is on gabapentin this will also help with his pain  5.  Orthostatic hypotension.  Patient had hypotension during physical therapy with dizziness orthostatic blood pressure was nonaerated was low.  He was bolused with IV 500 cc normal saline his blood pressure is better.  6.  Abdominal pain/gassy distention secondary to ileus mild patient will be kept n.p.o. except chips ordered NG tube but patient could not tolerate the NG tube and he refused.  Code Status: DNR  Severity of Illness: The appropriate patient status for this patient is INPATIENT. Inpatient status is judged to be reasonable and necessary in  order to provide the required intensity of service to ensure the patient's safety. The patient's presenting symptoms, physical exam findings, and initial radiographic and laboratory data in the context of their chronic comorbidities is felt to place them at high risk for further clinical deterioration. Furthermore, it is not anticipated that the patient will be medically stable for discharge from the hospital within 2 midnights of admission. The following factors support  the patient status of inpatient.   " The patient's presenting symptoms include fall with fracture. " The worrisome physical exam findings include fall with fracture. " The initial radiographic and laboratory data are worrisome because of requesting and waiting for rehab. " The chronic co-morbidities include chronic pain.   * I certify that at the point of admission it is my clinical judgment that the patient will require inpatient hospital care spanning beyond 2 midnights from the point of admission due to high intensity of service, high risk for further deterioration and high frequency of surveillance required.*    Family Communication: Patient  Disposition Plan: Rehab   Consultants:  Orthopedic  Procedures:  Right femur internal fixator  Antimicrobials:  None  DVT prophylaxis: Lovenox   Objective: Vitals:   03/13/20 0903 03/13/20 1500 03/13/20 2058 03/13/20 2101  BP: 113/61 116/60 117/62 134/80  Pulse: 98 90 (!) 110 (!) 58  Resp: 17 17 18 18   Temp: 99.4 F (37.4 C) 98.9 F (37.2 C) 99.6 F (37.6 C) 99.7 F (37.6 C)  TempSrc: Oral Oral Oral Oral  SpO2: 95% 96% 96% 96%  Weight:      Height:        Intake/Output Summary (Last 24 hours) at 03/14/2020 1110 Last data filed at 03/14/2020 0942 Gross per 24 hour  Intake 720 ml  Output 1500 ml  Net -780 ml   Filed Weights   03/07/20 1654 03/08/20 0245  Weight: 91.6 kg 94.5 kg   Body mass index is 26.75 kg/m.  PER PT:03/13/20 1005 03/13/20 1006 03/13/20 1008 Vital Signs Patient Position (if appropriate) Orthostatic Vitals -- -- Orthostatic Lying BP- Lying 113/61 99/64 (HOB elevated; Map 75) 106/53 (HOB to 30 deg; Map 69) Pulse- Lying 98 95 89 Orthostatic Sitting BP- Sitting (!) 82/65 (Dizzy; Map 73) (!) 75/45 (bed to semi-chair, HOB elevated to 50 deg; Map 51) -- Pulse- Sitting 64 70 -- Orthostatic Standing at 0 minutes BP- Standing at 0 minutes (Multiple attempts semi-standing in stedy; Unabler to tolerate;  difficulty relaxing arm, so BPs in late 200s/100s, suspect inaccurate; Very dizzy) -- --    Exam:  . General: 80 y.o. year-old male well developed well nourished in no acute distress.  Alert and oriented x3.  Pleasant . Cardiovascular: Regular rate and rhythm with no rubs or gallops.  No thyromegaly or JVD noted.   Marland Kitchen Respiratory: Clear to auscultation with no wheezes or rales. Good inspiratory effort. . Abdomen: Soft mildly tender nondistended with reduced bowel sounds x4 quadrants. . Musculoskeletal: No lower extremity edema. 2/4 pulses in all 4 extremities.  Tender right femoral thigh area patient has ice pack on it . Skin: No ulcerative lesions noted or rashes, . Psychiatry: Mood is appropriate for condition and setting    Data Reviewed: CBC: Recent Labs  Lab 03/07/20 1643 03/08/20 0338 03/08/20 1402 03/09/20 0222 03/10/20 0219 03/11/20 0255 03/14/20 0331  WBC 7.9   < > 16.2* 9.8 8.4 8.3 7.3  NEUTROABS 5.2  --   --   --   --   --  4.8  HGB 11.2*   < > 10.1* 8.5* 7.5* 7.3* 7.0*  HCT 34.1*   < > 28.7* 24.0* 21.6* 22.1* 21.4*  MCV 88.1   < > 86.4 85.4 86.1 87.7 89.2  PLT 172   < > 214 190 144* 158 228   < > = values in this interval not displayed.   Basic Metabolic Panel: Recent Labs  Lab 03/08/20 0338 03/08/20 1402 03/09/20 0222 03/10/20 0219 03/11/20 0255 03/14/20 0331  NA 132*  --  133* 130* 130* 131*  K 4.1  --  4.5 4.3 3.7 4.4  CL 100  --  98 97* 95* 95*  CO2 21*  --  27 25 24 25   GLUCOSE 131*  --  138* 124* 129* 125*  BUN 14  --  18 19 22  31*  CREATININE 1.24 1.38* 1.31* 1.16 1.27* 1.25*  CALCIUM 9.0  --  8.4* 8.4* 8.2* 8.2*  MG  --   --   --   --  2.1  --    GFR: Estimated Creatinine Clearance: 56.6 mL/min (A) (by C-G formula based on SCr of 1.25 mg/dL (H)). Liver Function Tests: Recent Labs  Lab 03/07/20 1643  AST 24  ALT 13  ALKPHOS 74  BILITOT 0.4  PROT 6.6  ALBUMIN 3.9   No results for input(s): LIPASE, AMYLASE in the last 168 hours. No  results for input(s): AMMONIA in the last 168 hours. Coagulation Profile: Recent Labs  Lab 03/07/20 1643  INR 1.1   Cardiac Enzymes: No results for input(s): CKTOTAL, CKMB, CKMBINDEX, TROPONINI in the last 168 hours. BNP (last 3 results) No results for input(s): PROBNP in the last 8760 hours. HbA1C: No results for input(s): HGBA1C in the last 72 hours. CBG: No results for input(s): GLUCAP in the last 168 hours. Lipid Profile: No results for input(s): CHOL, HDL, LDLCALC, TRIG, CHOLHDL, LDLDIRECT in the last 72 hours. Thyroid Function Tests: No results for input(s): TSH, T4TOTAL, FREET4, T3FREE, THYROIDAB in the last 72 hours. Anemia Panel: No results for input(s): VITAMINB12, FOLATE, FERRITIN, TIBC, IRON, RETICCTPCT in the last 72 hours. Urine analysis:    Component Value Date/Time   COLORURINE YELLOW 10/07/2019 [place] 10/07/2019 1256   LABSPEC 1.014 10/07/2019 1256   PHURINE 7.0 10/07/2019 1256   GLUCOSEU NEGATIVE 10/07/2019 1256   HGBUR MODERATE (A) 10/07/2019 1256   BILIRUBINUR NEGATIVE 10/07/2019 1256   KETONESUR NEGATIVE 10/07/2019 1256   PROTEINUR NEGATIVE 10/07/2019 1256   NITRITE NEGATIVE 10/07/2019 1256   LEUKOCYTESUR NEGATIVE 10/07/2019 1256   Sepsis Labs: @LABRCNTIP (procalcitonin:4,lacticidven:4)  ) Recent Results (from the past 240 hour(s))  Resp Panel by RT-PCR (Flu A&B, Covid) Nasopharyngeal Swab     Status: None   Collection Time: 03/07/20 11:27 PM   Specimen: Nasopharyngeal Swab; Nasopharyngeal(NP) swabs in vial transport medium  Result Value Ref Range Status   SARS Coronavirus 2 by RT PCR NEGATIVE NEGATIVE Final    Comment: (NOTE) SARS-CoV-2 target nucleic acids are NOT DETECTED.  The SARS-CoV-2 RNA is generally detectable in upper respiratory specimens during the acute phase of infection. The lowest concentration of SARS-CoV-2 viral copies this assay can detect is 138 copies/mL. A negative result does not preclude  SARS-Cov-2 infection and should not be used as the sole basis for treatment or other patient management decisions. A negative result may occur with  improper specimen collection/handling, submission of specimen other than nasopharyngeal swab, presence of viral mutation(s) within the areas targeted by this assay, and inadequate number of  viral copies(<138 copies/mL). A negative result must be combined with clinical observations, patient history, and epidemiological information. The expected result is Negative.  Fact Sheet for Patients:  EntrepreneurPulse.com.au  Fact Sheet for Healthcare Providers:  IncredibleEmployment.be  This test is no t yet approved or cleared by the [place] FDA and  has been authorized for detection and/or diagnosis of SARS-CoV-2 by FDA under an Emergency Use Authorization (EUA). This EUA will remain  in effect (meaning this test can be used) for the duration of the COVID-19 declaration under Section 564(b)(1) of the Act, 21 U.S.C.section 360bbb-3(b)(1), unless the authorization is terminated  or revoked sooner.       Influenza A by PCR NEGATIVE NEGATIVE Final   Influenza B by PCR NEGATIVE NEGATIVE Final    Comment: (NOTE) The Xpert Xpress SARS-CoV-2/FLU/RSV plus assay is intended as an aid in the diagnosis of influenza from Nasopharyngeal swab specimens and should not be used as a sole basis for treatment. Nasal washings and aspirates are unacceptable for Xpert Xpress SARS-CoV-2/FLU/RSV testing.  Fact Sheet for Patients: EntrepreneurPulse.com.au  Fact Sheet for Healthcare Providers: IncredibleEmployment.be  This test is not yet approved or cleared by the [place] FDA and has been authorized for detection and/or diagnosis of SARS-CoV-2 by FDA under an Emergency Use Authorization (EUA). This EUA will remain in effect (meaning this test can be used) for the duration of  the COVID-19 declaration under Section 564(b)(1) of the Act, 21 U.S.C. section 360bbb-3(b)(1), unless the authorization is terminated or revoked.  Performed at [facility],  [address], [place], [place] [address]   Surgical pcr screen     Status: None   Collection Time: 03/08/20  3:28 AM   Specimen: Nasal Mucosa; Nasal Swab  Result Value Ref Range Status   MRSA, PCR NEGATIVE NEGATIVE Final   Staphylococcus aureus NEGATIVE NEGATIVE Final    Comment: (NOTE) The Xpert SA Assay (FDA approved for NASAL specimens in patients 56 years of age and older), is one component of a comprehensive surveillance program. It is not intended to diagnose infection nor to guide or monitor treatment. Performed at [facility], [place] [address] [place], [place] [address]       Studies: DG Abd 1 View  Result Date: 03/13/2020 CLINICAL DATA:  Abdomen pain and gas in surgery EXAM: ABDOMEN - 1 VIEW COMPARISON:  None. FINDINGS: Mild diffuse increased small and large bowel gas. No definitive obstruction pattern identified. Moderate retained stool at the rectum. No radiopaque calculi. IMPRESSION: Mild diffuse increased small and large bowel gas, suggestive of mild ileus. Moderate retained stool at the rectum. Electronically Signed   By: Donavan Foil M.D.   On: 03/13/2020 17:23    Scheduled Meds: . Chlorhexidine Gluconate Cloth  6 each Topical Daily  . cholecalciferol  2,000 Units Oral Daily  . docusate sodium  100 mg Oral BID  . enoxaparin (LOVENOX) injection  40 mg Subcutaneous Q24H  . feeding supplement  237 mL Oral BID BM  . gabapentin  300 mg Oral QID  . multivitamin with minerals  1 tablet Oral Daily  . oxyCODONE-acetaminophen  1 tablet Oral Q12H   And  . oxyCODONE  5 mg Oral Q12H  . pantoprazole  40 mg Oral Daily  . polyethylene glycol  17 g Oral BID  . QUEtiapine  400 mg Oral QHS  . rOPINIRole  0.25 mg Oral QPM  . tamsulosin  0.4 mg Oral QPC breakfast    Continuous  Infusions: .  methocarbamol (ROBAXIN) IV Stopped (03/08/20 1323)     LOS: 7 days     Cristal Deer, MD [facility]  To reach me or the doctor on call, go to: [email or web address] Password [email or web address]  03/14/2020, 11:10 AM

## 2020-03-14 NOTE — TOC CAGE-AID Note (Signed)
Transition of Care [facility]) - CAGE-AID Screening   Patient Details  Name: Dustin [name] MRN: [id number] Date of Birth: 1940/07/23  Transition of Care [facility]) CM/SW Contact:    Ella Bodo, RN Phone Number: 03/14/2020, 4:05 PM   Clinical Narrative: Pt s/p fall at home with comminuted fx of distal right femur.  Pt denies ETOH and drug use.    CAGE-AID Screening:    Have You Ever Felt You Ought to Cut Down on Your Drinking or Drug Use?: No Have People Annoyed You By Critizing Your Drinking Or Drug Use?: No Have You Felt Bad Or Guilty About Your Drinking Or Drug Use?: No Have You Ever Had a Drink or Used Drugs First Thing In The Morning to Steady Your Nerves or to Get Rid of a Hangover?: No CAGE-AID Score: 0  Substance Abuse Education Offered: No (Denies ETOH or drug use)      Reinaldo Raddle, RN, BSN  Trauma/Neuro ICU Case Manager [phone]

## 2020-03-14 NOTE — Progress Notes (Signed)
Physical Therapy Treatment Patient Details Name: Dustin [name] MRN: [id number] DOB: March 10, 1940 Today's Date: 03/14/2020    History of Present Illness 80 y.o. male with history of chronic anemia, neuropathy, chronic combined systolic and diastolic CHF last EF measured was 40 to 45% presented to ED 03/07/20 after a fall at home after slipping on ice.  Did not lose consciousness. +comminuted fracture of distal right femur. 03/08/20 intramedullary nailing of right femur fracture    PT Comments    Continuing work on functional mobility and activity tolerance;  BP drop with pre-syncopal symptoms persists (see other PT note of this date); Still, notably smoother movement of RLE to EOB, and able to tolerate standing slightly longer than yesterday's session; Pt tells me he is consistently doing ankle pumps and quad setting; Will need to focus on therapeutic exercise, as well as mobility and standing tolerance next session   Follow Up Recommendations  SNF     Equipment Recommendations  Rolling walker with 5" wheels;3in1 (PT)    Recommendations for Other Services       Precautions / Restrictions Precautions Precautions: Fall Precaution Comments: Check orthostatics next session Restrictions RLE Weight Bearing: Weight bearing as tolerated Other Position/Activity Restrictions: Very painful and does not tolerate much weight through RLE    Mobility  Bed Mobility Overal bed mobility: Needs Assistance Bed Mobility: Supine to Sit;Sit to Supine     Supine to sit: Mod assist Sit to supine: Max assist   General bed mobility comments: Light mod assist to support RLE moving toward the EOB, and coming off EOB;  mod assist to help get to fully upright sitting, and square off hips at EOB; Mod cueing for technique. HOB elevated with increased time and effort and use of bed rails.  Transfers Overall transfer level: Needs assistance Equipment used: Rolling walker (2 wheeled) Transfers: Sit to/from  Stand Sit to Stand: Mod assist;From elevated surface;+2 physical assistance         General transfer comment: Employed RW for sit to stand from elevated bed; +2 mod assist to power up; Did not get to fully upright standing with hips extended, but able to stand long enough to assist with washup/hygeine  Ambulation/Gait             General Gait Details: Still with BP drop -- unable to progress to taking steps   Stairs             Wheelchair Mobility    Modified Rankin (Stroke Patients Only)       Balance     Sitting balance-Leahy Scale: Poor       Standing balance-Leahy Scale: Zero (Approaching Poor) Standing balance comment: Stood with bil support and assist; hips and knees flexed; notable BP drop                            Cognition Arousal/Alertness: Awake/alert Behavior During Therapy: WFL for tasks assessed/performed;Anxious (still anxious, but less so than last session) Overall Cognitive Status: Within Functional Limits for tasks assessed                                        Exercises      General Comments General comments (skin integrity, edema, etc.): Continuing assessment of activity and standing tolerance; BP drop again today (see previous note of this date)  Pertinent Vitals/Pain Pain Assessment: 0-10 Pain Score: 7  Pain Location: RLE Pain Descriptors / Indicators: Operative site guarding;Grimacing;Guarding;Discomfort Pain Intervention(s): Monitored during session;Premedicated before session    Home Living                      Prior Function            PT Goals (current goals can now be found in the care plan section) Acute Rehab PT Goals Patient Stated Goal: return home; get back to his wife PT Goal Formulation: With patient Time For Goal Achievement: 03/23/20 Potential to Achieve Goals: Good Progress towards PT goals: Progressing toward goals    Frequency    Min 2X/week      PT  Plan Current plan remains appropriate    Co-evaluation              AM-PAC PT "6 Clicks" Mobility   Outcome Measure  Help needed turning from your back to your side while in a flat bed without using bedrails?: A Little Help needed moving from lying on your back to sitting on the side of a flat bed without using bedrails?: A Lot Help needed moving to and from a bed to a chair (including a wheelchair)?: A Lot Help needed standing up from a chair using your arms (e.g., wheelchair or bedside chair)?: A Lot Help needed to walk in hospital room?: Total Help needed climbing 3-5 steps with a railing? : Total 6 Click Score: 11    End of Session Equipment Utilized During Treatment: Gait belt Activity Tolerance: Patient limited by pain;Treatment limited secondary to medical complications (Comment) (hypotension) Patient left: in bed;with call bell/phone within reach;with bed alarm set Nurse Communication: Mobility status;Other (comment) (Hypotension) PT Visit Diagnosis: Other abnormalities of gait and mobility (R26.89);History of falling (Z91.81);Pain Pain - Right/Left: Right Pain - part of body: Knee     Time: 0930-1010 PT Time Calculation (min) (ACUTE ONLY): 40 min  Charges:  $Therapeutic Activity: 38-52 mins                     Roney Marion, PT  Acute Rehabilitation Services Pager [phone] Office [phone]    Colletta [place] 03/14/2020, 12:43 PM

## 2020-03-14 NOTE — Progress Notes (Signed)
Physical Therapy Treatment Patient Details Name: Dustin [name] MRN: [id number] DOB: 1940/10/27 Today's Date: 03/14/2020    History of Present Illness 80 y.o. male with history of chronic anemia, neuropathy, chronic combined systolic and diastolic CHF last EF measured was 40 to 45% presented to ED 03/07/20 after a fall at home after slipping on ice.  Did not lose consciousness. +comminuted fracture of distal right femur. 03/08/20 intramedullary nailing of right femur fracture    PT Comments    Continuing work on functional mobility and activity tolerance;  Session focused on Therapeutic exercise for muscle activation about RLE hip and knee; still very painful with movement, but better able to tolerate with self-assisted ROM; pt used sheet roll to assist RLE going through the motions   Follow Up Recommendations  SNF     Equipment Recommendations  Rolling walker with 5" wheels;3in1 (PT)    Recommendations for Other Services       Precautions / Restrictions Precautions Precautions: Fall Precaution Comments: Check orthostatics next session Restrictions RLE Weight Bearing: Weight bearing as tolerated Other Position/Activity Restrictions: Very painful and does not tolerate much weight through RLE    Mobility  Bed Mobility                  Transfers                    Ambulation/Gait                 Stairs             Wheelchair Mobility    Modified Rankin (Stroke Patients Only)       Balance                                            Cognition Arousal/Alertness: Awake/alert Behavior During Therapy: WFL for tasks assessed/performed;Anxious Overall Cognitive Status: Within Functional Limits for tasks assessed                                        Exercises General Exercises - Lower Extremity Ankle Circles/Pumps: AROM;Both;10 reps Quad Sets: AROM;Right;20 reps Short Arc QuadSinclair Ship;Left;10 reps Heel  Slides: AAROM;Right;10 reps Hip ABduction/ADduction: AAROM;Right;10 reps Other Exercises Other Exercises: isometric hip adduction x10 Other Exercises: Isometric hip abduction, R x10    General Comments        Pertinent Vitals/Pain Pain Assessment: 0-10 Pain Score: 7  Pain Location: RLE Pain Intervention(s): Monitored during session    Home Living                      Prior Function            PT Goals (current goals can now be found in the care plan section) Acute Rehab PT Goals Patient Stated Goal: return home; get back to his wife PT Goal Formulation: With patient Time For Goal Achievement: 03/23/20 Potential to Achieve Goals: Good Progress towards PT goals: Progressing toward goals    Frequency    Min 2X/week      PT Plan Current plan remains appropriate    Co-evaluation              AM-PAC PT "6 Clicks" Mobility   Outcome Measure  Help needed turning from your  back to your side while in a flat bed without using bedrails?: A Little Help needed moving from lying on your back to sitting on the side of a flat bed without using bedrails?: A Lot Help needed moving to and from a bed to a chair (including a wheelchair)?: A Lot Help needed standing up from a chair using your arms (e.g., wheelchair or bedside chair)?: A Lot Help needed to walk in hospital room?: Total Help needed climbing 3-5 steps with a railing? : Total 6 Click Score: 11    End of Session   Activity Tolerance: Patient tolerated treatment well Patient left: in bed;with call bell/phone within reach;with bed alarm set   PT Visit Diagnosis: Other abnormalities of gait and mobility (R26.89);History of falling (Z91.81);Pain Pain - Right/Left: Right Pain - part of body: Knee     Time: 1354-1415 PT Time Calculation (min) (ACUTE ONLY): 21 min  Charges:  $Therapeutic Exercise: 8-22 mins                     Roney Marion, PT  Acute Rehabilitation Services Pager [phone] Office  [phone]    Colletta [place] 03/14/2020, 6:21 PM

## 2020-03-14 NOTE — Progress Notes (Signed)
RN attempted to put NG tube in per order. Pt fighting catheter and unable to pass through. Pt then refused to have another RN attempt. MD made aware.Okay with no NG tube and continue ice chips.

## 2020-03-15 LAB — SARS CORONAVIRUS 2 (TAT 6-24 HRS): SARS Coronavirus 2: NEGATIVE

## 2020-03-15 LAB — CREATININE, SERUM
Creatinine, Ser: 1.2 mg/dL (ref 0.61–1.24)
GFR, Estimated: >60 mL/min (ref >60)

## 2020-03-15 MED ORDER — SENNA 8.6 MG PO TABS
1.0000 | ORAL_TABLET | Freq: Once | ORAL | Status: AC
  Administered 2020-03-15: 8.6 mg via ORAL
  Filled 2020-03-15: qty 1

## 2020-03-15 MED ORDER — LACTULOSE 10 GM/15ML PO SOLN
30.0000 g | Freq: Once | ORAL | Status: AC
  Administered 2020-03-15: 30 g via ORAL
  Filled 2020-03-15: qty 45

## 2020-03-15 NOTE — NC FL2 (Signed)
[place] LEVEL OF CARE SCREENING TOOL     IDENTIFICATION  Patient Name: Dustin [name] Birthdate: 07-17-1940 Sex: male Admission Date (Current Location): 03/07/2020  [facility] and [place] Number:  Herbalist and Address:  [facility] [place], [place] [address] [place], [place] [address]      Provider Number: [id number]  Attending Physician Name and Address:  Cristal Deer, MD  Relative Name and Phone Number:       Current Level of Care: Hospital Recommended Level of Care: [place] Prior Approval Number:    Date Approved/Denied:   PASRR Number: [id number]  Discharge Plan: SNF    Current Diagnoses: Patient Active Problem List   Diagnosis Date Noted  . Femur fracture ([place]) 03/07/2020  . Hip fracture ([place]) 03/07/2020  . Cellulitis of right lower extremity   . Acute metabolic encephalopathy   . Unspecified fracture of right foot, initial encounter for closed fracture   . Acute urinary retention   . Idiopathic peripheral neuropathy   . Sepsis due to cellulitis ([place]) 10/07/2019  . New onset headache 01/20/2018  . Muscle weakness (generalized) 11/28/2017  . Weakness 11/26/2017  . Right lumbar radiculopathy 09/18/2016  . Right leg pain 07/22/2016  . Restless leg syndrome 05/28/2016  . Iron deficiency anemia 05/28/2016  . Fall 02/10/2016  . Rib fracture 02/10/2016  . Acute encephalopathy 02/10/2016  . Chronic combined systolic (congestive) and diastolic (congestive) heart failure ([place])   . Ataxia   . Hyponatremia 01/22/2016  . Gait instability 01/22/2016  . Urinary retention   . Stroke-like symptom   . Syncope 05/02/2015  . Closed left fibular fracture 05/02/2015  . GERD (gastroesophageal reflux disease) 05/02/2015  . BPH (benign prostatic hyperplasia) 05/02/2015  . Fracture of distal end of left fibula   . 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 Depression 01/30/2015  . 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     Orientation RESPIRATION BLADDER Height & Weight     Self,Time,Situation,Place  Normal Continent Weight: 94.5 kg Height:  6\' 2"  (188 cm)  BEHAVIORAL SYMPTOMS/MOOD NEUROLOGICAL BOWEL NUTRITION STATUS      Continent Diet (refer to d/c summary)  AMBULATORY STATUS COMMUNICATION OF NEEDS Skin   Extensive Assist Verbally Surgical wounds (s/p IM NAILING 1/19)                       Personal Care Assistance Level of Assistance  Bathing,Dressing,Feeding Bathing Assistance: Maximum assistance Feeding assistance: Independent Dressing Assistance: Maximum assistance     Functional Limitations Info  Sight,Hearing,Speech Sight Info: Adequate Hearing Info: Adequate Speech Info: Adequate    SPECIAL CARE FACTORS FREQUENCY  PT (By licensed PT),OT (By licensed OT)     PT Frequency: 5x/wk, evaluate and treat OT Frequency: 5x/wk, evaluate and treat            Contractures Contractures Info: Not present    Additional Factors Info  Code Status,Allergies Code Status Info: DNR Allergies Info: No Known Allergies           Current Medications (03/15/2020):  This is the current hospital active medication list Current Facility-Administered Medications  Medication Dose Route Frequency Provider Last Rate Last Admin  . acetaminophen (TYLENOL) tablet 325-650 mg  325-650 mg Oral Q6H PRN Delray Alt, PA-C      . bisacodyl (DULCOLAX) suppository 10 mg  10 mg Rectal Daily PRN Cristal Deer, MD   10 mg at 03/13/20 1221  . Chlorhexidine Gluconate Cloth 2 %  PADS 6 each  6 each Topical Daily Pokhrel, Laxman, MD   6 each at 03/15/20 0850  . cholecalciferol (VITAMIN D3) tablet 2,000 Units  2,000 Units Oral Daily Delray Alt, PA-C   2,000 Units at 03/15/20 0848  . docusate sodium (COLACE) capsule 100 mg  100 mg Oral BID Patrecia Pace A, PA-C   100 mg at 03/15/20 0848  . enoxaparin (LOVENOX) injection 40 mg  40 mg Subcutaneous Q24H Patrecia Pace A, PA-C   40 mg at 03/15/20 0848  . feeding  supplement (ENSURE ENLIVE / ENSURE PLUS) liquid 237 mL  237 mL Oral BID BM Pokhrel, Laxman, MD   237 mL at 03/14/20 0937  . gabapentin (NEURONTIN) capsule 300 mg  300 mg Oral QID Patrecia Pace A, PA-C   300 mg at 03/15/20 0848  . lactated ringers infusion   Intravenous Continuous Cristal Deer, MD 125 mL/hr at 03/15/20 1005 Rate Change at 03/15/20 1005  . melatonin tablet 10 mg  10 mg Oral QHS PRN Pokhrel, Laxman, MD   10 mg at 03/13/20 2204  . methocarbamol (ROBAXIN) tablet 500 mg  500 mg Oral Q6H PRN Delray Alt, PA-C   500 mg at 03/15/20 1136   Or  . methocarbamol (ROBAXIN) 500 mg in dextrose 5 % 50 mL IVPB  500 mg Intravenous Q6H PRN Delray Alt, PA-C   Stopped at 03/08/20 1323  . metoCLOPramide (REGLAN) tablet 5-10 mg  5-10 mg Oral Q8H PRN Patrecia Pace A, PA-C       Or  . metoCLOPramide (REGLAN) injection 5-10 mg  5-10 mg Intravenous Q8H PRN Delray Alt, PA-C      . morphine 2 MG/ML injection 0.5 mg  0.5 mg Intravenous Q4H PRN Patrecia Pace A, PA-C   0.5 mg at 03/11/20 1448  . multivitamin with minerals tablet 1 tablet  1 tablet Oral Daily Pokhrel, Laxman, MD   1 tablet at 03/15/20 0848  . ondansetron (ZOFRAN) tablet 4 mg  4 mg Oral Q6H PRN Delray Alt, PA-C       Or  . ondansetron [facility]) injection 4 mg  4 mg Intravenous Q6H PRN Patrecia Pace A, PA-C      . oxyCODONE (Oxy IR/ROXICODONE) immediate release tablet 20 mg  20 mg Oral QID PRN Patrecia Pace A, PA-C   20 mg at 03/15/20 1136  . oxyCODONE-acetaminophen (PERCOCET/ROXICET) 5-325 MG per tablet 1 tablet  1 tablet Oral Q12H Alvira Philips, [place]   1 tablet at 03/15/20 0848   And  . oxyCODONE (Oxy IR/ROXICODONE) immediate release tablet 5 mg  5 mg Oral Q12H Alvira Philips, [place]   5 mg at 03/15/20 0848  . pantoprazole (PROTONIX) EC tablet 40 mg  40 mg Oral Daily Pokhrel, Laxman, MD   40 mg at 03/15/20 0848  . polyethylene glycol (MIRALAX / GLYCOLAX) packet 17 g  17 g Oral BID Cristal Deer, MD   17 g at 03/13/20  0826  . QUEtiapine (SEROQUEL) tablet 400 mg  400 mg Oral QHS Pokhrel, Laxman, MD   400 mg at 03/14/20 2205  . rOPINIRole (REQUIP) tablet 0.25 mg  0.25 mg Oral QPM Pokhrel, Laxman, MD   0.25 mg at 03/14/20 1724  . simethicone (MYLICON) chewable tablet 80 mg  80 mg Oral QID PRN Cristal Deer, MD   80 mg at 03/14/20 1254  . tamsulosin (FLOMAX) capsule 0.4 mg  0.4 mg Oral QPC breakfast Lang Snow, FNP   0.4 mg at 03/15/20  0848     Discharge Medications: Please see discharge summary for a list of discharge medications.  Relevant Imaging Results:  Relevant Lab Results:   Additional Information SS # [id number]  Sharin Mons, RN

## 2020-03-15 NOTE — Progress Notes (Signed)
Occupational Therapy Treatment Patient Details Name: Dustin [name] MRN: [id number] DOB: 1940-07-11 Today's Date: 03/15/2020    History of present illness 80 y.o. male with history of chronic anemia, neuropathy, chronic combined systolic and diastolic CHF last EF measured was 40 to 45% presented to ED 03/07/20 after a fall at home after slipping on ice.  Did not lose consciousness. +comminuted fracture of distal right femur. 03/08/20 intramedullary nailing of right femur fracture; hospital course complicated by orthostatic hypotension and ileus   OT comments  Pt making progress with functional goals, however still limited by pain, dizziness with BP dropping. Pt still did not tolerate standing for more than 60-90 seconds, and we were unable to obtain a true standing BP; Dr. Kyung Bacca aware, and requests we get a fluid bolus before pt tomorrow. Pt able to SPT to recliner. OT will continue to follow acutely to maximize level of function and safety  Follow Up Recommendations  SNF    Equipment Recommendations  3 in 1 bedside commode;Other (comment) (RW, TBD at [facility])    Recommendations for Other Services      Precautions / Restrictions Precautions Precautions: Fall Precaution Comments: Check orthostatics; for 1/27: check in with RN -- planning to get a fluid bolus in before PT Restrictions Weight Bearing Restrictions: Yes RLE Weight Bearing: Weight bearing as tolerated Other Position/Activity Restrictions: Very painful and does not tolerate much weight through RLE       Mobility Bed Mobility Overal bed mobility: Needs Assistance Bed Mobility: Supine to Sit     Supine to sit: Min guard     General bed mobility comments: Used rolled sheet in a leg-lifter fashion to move RLE to EOB; slow moving, but did not need phsyical assist to get up; cues to self-monitor for activity tolernace  Transfers Overall transfer level: Needs assistance Equipment used: Rolling walker (2 wheeled) Transfers:  Sit to/from [facility] Sit to Stand: Min assist;+2 safety/equipment Stand pivot transfers: +2 safety/equipment;Min assist       General transfer comment: Extra time to scoot out and prepare to stand; heavy anterior weight shift with dependence on UEs to push up trunk and shoulders; Overall good liftoff from elevated bed; Took a few pivot steps to transfer towards the recliner placed on his stronger L side; difficulty accepting weight on RLE to allow for LLE advancement and heavy dependence on UE suport form RW    Balance Overall balance assessment: Needs assistance Sitting-balance support: Bilateral upper extremity supported;Feet supported Sitting balance-Leahy Scale: Fair     Standing balance support: Bilateral upper extremity supported;During functional activity Standing balance-Leahy Scale: Poor Standing balance comment: Stood with bil support and assist; hips and knees flexed; notable BP drop                           ADL either performed or assessed with clinical judgement   ADL Overall ADL's : Needs assistance/impaired     Grooming: Wash/dry hands;Wash/dry face;Sitting;Supervision/safety;Set up           Upper Body Dressing : Min guard;Sitting       Toilet Transfer: Minimal assistance;+2 for physical assistance;+2 for safety/equipment;Cueing for safety;Cueing for sequencing;RW   Toileting- Clothing Manipulation and Hygiene: Maximal assistance;Sit to/from stand       Functional mobility during ADLs: +2 for physical assistance;Minimal assistance;+2 for safety/equipment;Rolling walker;Cueing for sequencing;Cueing for safety       Vision Baseline Vision/History: Wears glasses Patient Visual Report: No change from  baseline     Perception     Praxis      Cognition Arousal/Alertness: Awake/alert Behavior During Therapy: WFL for tasks assessed/performed;Anxious Overall Cognitive Status: Within Functional Limits for tasks assessed                                           Exercises General Exercises - Lower Extremity Ankle Circles/Pumps: AROM;Both;10 reps Quad Sets: AROM;Right;20 reps Gluteal Sets: AROM;Both;10 reps Short Arc QuadSinclair Ship;Left;10 reps Heel Slides: AAROM;Right;10 reps (used sheet roll for self-AA/ROM) Hip ABduction/ADduction: AAROM;Right;10 reps Other Exercises Other Exercises: isometric hip adduction x10   Shoulder Instructions       General Comments      Pertinent Vitals/ Pain       Pain Assessment: 0-10 Pain Score: 5  Pain Location: RLE Pain Descriptors / Indicators: Operative site guarding;Grimacing;Guarding;Discomfort Pain Intervention(s): Limited activity within patient's tolerance;Monitored during session;Repositioned  Home Living                                          Prior Functioning/Environment              Frequency  Min 2X/week        Progress Toward Goals  OT Goals(current goals can now be found in the care plan section)  Progress towards OT goals: Progressing toward goals  Acute Rehab OT Goals Patient Stated Goal: return home; get back to his wife  Plan Discharge plan remains appropriate    Co-evaluation    PT/OT/SLP Co-Evaluation/Treatment: Yes Reason for Co-Treatment: For patient/therapist safety;To address functional/ADL transfers   OT goals addressed during session: ADL's and self-care;Proper use of Adaptive equipment and DME      AM-PAC OT "6 Clicks" Daily Activity     Outcome Measure   Help from another person eating meals?: None Help from another person taking care of personal grooming?: A Little Help from another person toileting, which includes using toliet, bedpan, or urinal?: A Lot Help from another person bathing (including washing, rinsing, drying)?: A Lot Help from another person to put on and taking off regular upper body clothing?: A Little Help from another person to put on and taking off regular  lower body clothing?: Total 6 Click Score: 15    End of Session Equipment Utilized During Treatment: Gait belt;Rolling walker  OT Visit Diagnosis: Other abnormalities of gait and mobility (R26.89);Muscle weakness (generalized) (M62.81);History of falling (Z91.81);Pain Pain - Right/Left: Right Pain - part of body: Leg   Activity Tolerance Patient limited by pain;Other (comment) (dizziness, BP drop)   Patient Left with call bell/phone within reach;in chair   Nurse Communication          Time: 3474-2595 OT Time Calculation (min): 24 min  Charges: OT General Charges $OT Visit: 1 Visit OT Treatments $Self Care/Home Management : 8-22 mins     Britt Bottom 03/15/2020, 4:41 PM

## 2020-03-15 NOTE — TOC Initial Note (Addendum)
Transition of Care [facility]) - Initial/Assessment Note    Patient Details  Name: Dustin [name] MRN: [id number] Date of Birth: [birth year]  [facility] CM/SW Contact:    Gabrielle Dare Phone Number: 03/15/2020, 1:59 PM  Clinical Narrative:                 CSW received message from pt's nurse concerning SNF placement.  CSW spoke with pt's spouse.  Pt's spouse and family prefers [employer] copy for SNF placement.  Pt's spouse and daughter in law were given accepting SNF offers. CSW gave medicare.gov information was given to spouse and daughter in law.  Pt lives with his spouse in a single story home.  Pt is fully vaccinated and has received booster.  Pt's spouse does not remember date of booster or name of vaccine.  Pt's spouse is agreeable to SNF placement. TOC Team will continue to assist with disposition planning.  Expected Discharge Plan: Skilled Nursing Facility Barriers to Discharge: Continued Medical Work up,Insurance Authorization,SNF Pending bed offer   Patient Goals and CMS Choice Patient states their goals for this hospitalization and ongoing recovery are:: Per spouse"want hime to be able to walk and come home". CMS Medicare.gov Compare Post Acute Care list provided to:: Other (Comment Required) (pt's spouse and daughter in law) Choice offered to / list presented to : [facility]  Expected Discharge Plan and Services Expected Discharge Plan: [place]   Discharge Planning Services: CM Consult   Living arrangements for the past 2 months: Single Family Home                                      Prior Living Arrangements/Services Living arrangements for the past 2 months: Single Family Home Lives with:: Spouse Patient language and need for interpreter reviewed:: Yes Do you feel safe going back to the place where you live?: Yes      Need for Family Participation in Patient Care: Yes (Comment) Care giver support system in place?: Yes  (comment)   Criminal Activity/Legal Involvement Pertinent to Current Situation/Hospitalization: No - Comment as needed  Activities of Daily Living      Permission Sought/Granted Permission sought to share information with : Case Manager,Facility Contact Representative,Family Supports Permission granted to share information with : Yes, Verbal Permission Granted  Share Information with NAME: Dustin [name] (spouse) [phone]  Permission granted to share info w AGENCY: Yes  Permission granted to share info w Relationship: Spouse, Dustin [name]  Permission granted to share info w Contact Information: Yes  Emotional Assessment Appearance:: Appears stated age Attitude/Demeanor/Rapport: Gracious Affect (typically observed): Accepting Orientation: : Oriented to Self,Oriented to Place,Oriented to  Time,Oriented to Situation Alcohol / Substance Use: Not Applicable Psych Involvement: No (comment)  Admission diagnosis:  Femur fracture ([place]) [S72.90XA] Hip fracture ([place]) [S72.009A] Fall [W19.XXXA] Closed displaced oblique fracture of shaft of right femur, initial encounter [facility]) [S72.331A] Patient Active Problem List   Diagnosis Date Noted  . Femur fracture ([place]) 03/07/2020  . Hip fracture ([place]) 03/07/2020  . Cellulitis of right lower extremity   . Acute metabolic encephalopathy   . Unspecified fracture of right foot, initial encounter for closed fracture   . Acute urinary retention   . Idiopathic peripheral neuropathy   . Sepsis due to cellulitis ([place]) 10/07/2019  . New onset headache 01/20/2018  . Muscle weakness (generalized) 11/28/2017  . Weakness 11/26/2017  . Right  lumbar radiculopathy 09/18/2016  . Right leg pain 07/22/2016  . Restless leg syndrome 05/28/2016  . Iron deficiency anemia 05/28/2016  . Fall 02/10/2016  . Rib fracture 02/10/2016  . Acute encephalopathy 02/10/2016  . Chronic combined systolic (congestive) and diastolic (congestive) heart failure ([place])    . Ataxia   . Hyponatremia 01/22/2016  . Gait instability 01/22/2016  . Urinary retention   . Stroke-like symptom   . Syncope 05/02/2015  . Closed left fibular fracture 05/02/2015  . GERD (gastroesophageal reflux disease) 05/02/2015  . BPH (benign prostatic hyperplasia) 05/02/2015  . Fracture of distal end of left fibula   . 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 Depression 01/30/2015  . 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 [place] [address] Phone: [phone] Fax: [phone]     Social Determinants of Health (SDOH) Interventions    Readmission Risk Interventions No flowsheet data found.

## 2020-03-15 NOTE — Progress Notes (Signed)
Physical Therapy Note  Small, but notable improvement in activity tolerance compared with last session;  Symptomatic for lightheadedness post sitting about 10 min, and with standing and taking pivot steps bed to recliner;     03/15/20 1030 03/15/20 1035 03/15/20 1036  Orthostatic Lying   BP- Lying 127/62 (Map 82)  --   --   Pulse- Lying 65  --   --   Orthostatic Sitting  BP- Sitting 115/65 (Map 81) 97/84 (map 91; after stand and pivotal steps bed to recliner; upright approx 1-2 minutes lightheaded, and needed to sit) 116/70 (map80)  Pulse- Sitting 117 88 120   Seen in conjunction with OT for continuing assessment of mobility and ADLs with a particular focus on activity tolerance;   (full PT note to follow)  Roney Marion, [place] Pager [phone] Office [phone]

## 2020-03-15 NOTE — Progress Notes (Signed)
Physical Therapy Treatment Patient Details Name: Dustin [name] MRN: [id number] DOB: [birth year] Today's Date: 03/15/2020    History of Present Illness 80 y.o. male with history of chronic anemia, neuropathy, chronic combined systolic and diastolic CHF last EF measured was 40 to 45% presented to ED 03/07/20 after a fall at home after slipping on ice.  Did not lose consciousness. +comminuted fracture of distal right femur. 03/08/20 intramedullary nailing of right femur fracture; hospital course complicated by orthostatic hypotension and ileus    PT Comments    Continuing work on functional mobility and activity tolerance;  Notable improvements in bed mobility, sit to stand, and stand pivot transfers; Still recommend 2 person assist for safety; BPs documented in previous note of this date; While we were able to help him OOB to the recliner, he still did not tolerate standing for more than 60-90 seconds, and we were unable to obtain a true standing BP; Dr. Kyung Bacca aware, and requests we get a fluid bolus before pt tomorrow;    Follow Up Recommendations  SNF     Equipment Recommendations  Rolling walker with 5" wheels;3in1 (PT)    Recommendations for Other Services       Precautions / Restrictions Precautions Precautions: Fall Precaution Comments: Check orthostatics; for 1/27: check in with RN -- planning to get a fluid bolus in before PT Restrictions RLE Weight Bearing: Weight bearing as tolerated Other Position/Activity Restrictions: Very painful and does not tolerate much weight through RLE    Mobility  Bed Mobility Overal bed mobility: Needs Assistance Bed Mobility: Supine to Sit     Supine to sit: Min guard     General bed mobility comments: Used rolled sheet in a leg-lifter fashion to move RLE to EOB; slow moving, but did not need phsyical assist to get up; cues to self-monitor for activity tolernace  Transfers Overall transfer level: Needs assistance Equipment used: Rolling  walker (2 wheeled) Transfers: Sit to/from [facility] Sit to Stand: [employer] safety/equipment (bed elevated) Stand pivot transfers: +2 safety/equipment;Min assist       General transfer comment: Extra time to scoot out and prepare to stand; heavy anterior weight shift with dependence on UEs to push up trunk and shoulders; Overall good liftoff from elevated bed; Took a few pivot steps to transfer towards the recliner placed on his stronger L side; difficulty accepting weight on RLE to allow for LLE advancement and heavy dependence on UE suport form RW  Ambulation/Gait             General Gait Details: pivot steps with transfer as above; lightheadedness in standing precluded further amb   Stairs             Wheelchair Mobility    Modified Rankin (Stroke Patients Only)       Balance     Sitting balance-Leahy Scale: Fair       Standing balance-Leahy Scale: Poor Standing balance comment: Stood with bil support and assist; hips and knees flexed; notable BP drop                            Cognition Arousal/Alertness: Awake/alert Behavior During Therapy: WFL for tasks assessed/performed;Anxious Overall Cognitive Status: Within Functional Limits for tasks assessed  Exercises General Exercises - Lower Extremity Ankle Circles/Pumps: AROM;Both;10 reps Quad Sets: AROM;Right;20 reps Gluteal Sets: AROM;Both;10 reps Short Arc QuadSinclair Ship;Left;10 reps Heel Slides: AAROM;Right;10 reps (used sheet roll for self-AA/ROM) Hip ABduction/ADduction: AAROM;Right;10 reps Other Exercises Other Exercises: isometric hip adduction x10    General Comments        Pertinent Vitals/Pain Pain Assessment: 0-10 Pain Score: 5  Pain Location: RLE Pain Descriptors / Indicators: Operative site guarding;Grimacing;Guarding;Discomfort Pain Intervention(s): Monitored during session    Home Living                       Prior Function            PT Goals (current goals can now be found in the care plan section) Acute Rehab PT Goals Patient Stated Goal: return home; get back to his wife PT Goal Formulation: With patient Time For Goal Achievement: 03/23/20 Potential to Achieve Goals: Good Progress towards PT goals: Progressing toward goals    Frequency    Min 2X/week      PT Plan Current plan remains appropriate    Co-evaluation PT/OT/SLP Co-Evaluation/Treatment: Yes Reason for Co-Treatment: For patient/therapist safety;To address functional/ADL transfers          AM-PAC PT "6 Clicks" Mobility   Outcome Measure  Help needed turning from your back to your side while in a flat bed without using bedrails?: A Little Help needed moving from lying on your back to sitting on the side of a flat bed without using bedrails?: A Little Help needed moving to and from a bed to a chair (including a wheelchair)?: A Lot Help needed standing up from a chair using your arms (e.g., wheelchair or bedside chair)?: A Lot Help needed to walk in hospital room?: A Lot Help needed climbing 3-5 steps with a railing? : Total 6 Click Score: 13    End of Session Equipment Utilized During Treatment: Gait belt Activity Tolerance: Patient tolerated treatment well Patient left: in chair;with call bell/phone within reach;with chair alarm set Nurse Communication: Mobility status;Other (comment) (Hypotension) PT Visit Diagnosis: Other abnormalities of gait and mobility (R26.89);History of falling (Z91.81);Pain Pain - Right/Left: Right Pain - part of body: Knee     Time: 7902-4097 PT Time Calculation (min) (ACUTE ONLY): 38 min  Charges:  $Gait Training: 8-22 mins $Therapeutic Exercise: 8-22 mins                     Roney Marion, PT  Acute Rehabilitation Services Pager [phone] Office [place] 03/15/2020, 4:21 PM

## 2020-03-15 NOTE — Plan of Care (Signed)
  Problem: Activity: Goal: Risk for activity intolerance will decrease Outcome: Progressing   Problem: Pain Managment: Goal: General experience of comfort will improve Outcome: Progressing   

## 2020-03-15 NOTE — Progress Notes (Signed)
PROGRESS NOTE  Dustin [name] ERX:[id number] DOB: 10-Jul-1940 DOA: 03/07/2020 PCP: Lujean Amel, MD  HPI/Recap of past 24 hours: Dustin Jansky Dustin [name] a 80 y.o.malewithhistory of chronic anemia, neuropathy, chronic combined systolic and diastolic CHF, last EF of  40 to 45% with grade 1 diastolic dysfunction in 9509 presented to the hospital after having a fall after slipping on the ice.  Patient did not lose any consciousness but had significant pain in the right groin area.  In the ED, X-rays revealed acute comminuted fracture of right distal femur. Labs were significant for anemia. On-call orthopedic surgeon was consulted and the patient was admitted for further management of the right thigh distal femur fracture. COVID test was negative.    Subjective: March 15, 2020: Patient seen and examined at bedside said he feels okay he did not sleep very well last night.  He is still n.p.o. except ice chips due to increased gaseous distention with ileus and gas pain or abdominal pain.Marland Kitchen  He does not have any BM bowel movement yet, he said he is just passing gas.  He was checked for fecal impaction and an RN reported no fecal impaction  March 14, 2020. Patient seen and examined at bedside.  He was complaining of increased gas and pain and requesting for more gas pills.  X-ray was ordered and it shows some mild ileus.  Also he had another episode of dizziness and hypotension during physical therapy today  March 13, 2020: Seen and examined at bedside.  Patient complained of dizziness when physical therapy came to see him and he was noted to have low blood pressure pressure. Nurse complained he has not had any bowel movement for 6 days now despite MiraLAX and Colace, also complaining of gas pain  March 12, 2020 Seen and examined at bedside.  Patient had complained of gas pain still no BM  March 11, 2020: Patient seen and examined at bedside.  Complains of pain on the right femoral from  his fracture .  He is status post internal fixator General and 19 2021.  Also he complains of not having bowel movement but then said he has not been eating much because he has poor appetite Also complained that at home before he even fell and had a fracture he was on pain medication: Percocet 10/325 1 tablet twice daily and oxycodone 20 mg 4 times daily and also morphine.  I reviewed his home meds , could only see the oxycodone, tramadol and the Percocet.  Currently he is on oxycodone 20 mg 4 times a day as needed, oxycodone IR 5 mg every 12 hours, Percocet 5/325 1 tablet every 12 hours And morphine 0.5 mg as needed pain   assessment/Plan: Principal Problem:   Femur fracture (HCC) Active Problems:   Peripheral axonal neuropathy   Hip fracture (HCC)  #1 right hip fracture status post internal fixator waiting for rehab  2.  Chronic pain on long-term opiate therapy  3.  Constipation likely from heavy dose opioid use.  He is already on MiraLAX daily as needed and docusate sodium 1 tablet twice daily.  I will change MiraLAX to twice daily I have ordered Dulcolax suppository this morning with no improvement.  I have added lactulose x1 tonight.  Also x-ray of the abdomen  4.  Chronic neuropathy patient is on gabapentin this will also help with his pain  5.  Orthostatic hypotension.  Patient had hypotension during physical therapy with dizziness orthostatic blood pressure was nonaerated was low.  He was bolused with IV 500 cc normal saline his blood pressure is better.  We will maintain him at lactated Ringer's of 125 mils per hour  6.  Abdominal pain/gassy distention secondary to ileus mild patient will be kept n.p.o. except chips ordered NG tube but patient could not tolerate the NG tube and he refused.  7.  Anemia hemoglobin is 7.0 today was 7.5.  We will continue to monitor  8.  Mild hyponatremia 1 of 131 today it was 130 is improving  9.  AKI.  His BUN today is 31 and creatinine of 1.27 it  was 1.16.  In addition for him having hypotension and his queen BUN and creatinine bumping up I will start increase his IV fluid 225/h lactated Ringer's.  Code Status: DNR  Severity of Illness: The appropriate patient status for this patient is INPATIENT. Inpatient status is judged to be reasonable and necessary in order to provide the required intensity of service to ensure the patient's safety. The patient's presenting symptoms, physical exam findings, and initial radiographic and laboratory data in the context of their chronic comorbidities is felt to place them at high risk for further clinical deterioration. Furthermore, it is not anticipated that the patient will be medically stable for discharge from the hospital within 2 midnights of admission. The following factors support the patient status of inpatient.   " The patient's presenting symptoms include fall with fracture. " The worrisome physical exam findings include fall with fracture. " The initial radiographic and laboratory data are worrisome because of requesting and waiting for rehab. " The chronic co-morbidities include chronic pain.   * I certify that at the point of admission it is my clinical judgment that the patient will require inpatient hospital care spanning beyond 2 midnights from the point of admission due to high intensity of service, high risk for further deterioration and high frequency of surveillance required.*    Family Communication: Patient  Disposition Plan: Rehab   Consultants:  Orthopedic  Procedures:  Right femur internal fixator  Antimicrobials:  None  DVT prophylaxis: Lovenox   Objective: Vitals:   03/13/20 2101 03/14/20 1417 03/14/20 1900 03/15/20 0500  BP: 134/80 140/68 123/69 114/66  Pulse: (!) 58 (!) 110 (!) 107   Resp: 18 18 16 15   Temp: 99.7 F (37.6 C) 98.7 F (37.1 C) 99.1 F (37.3 C) 98 F (36.7 C)  TempSrc: Oral Oral Oral Oral  SpO2: 96% 98% 97% 98%  Weight:       Height:        Intake/Output Summary (Last 24 hours) at 03/15/2020 0921 Last data filed at 03/15/2020 0548 Gross per 24 hour  Intake 649.42 ml  Output 2250 ml  Net -1600.58 ml   Filed Weights   03/07/20 1654 03/08/20 0245  Weight: 91.6 kg 94.5 kg   Body mass index is 26.75 kg/m.  PER PT:03/13/20 1005 03/13/20 1006 03/13/20 1008 Vital Signs Patient Position (if appropriate) Orthostatic Vitals -- -- Orthostatic Lying BP- Lying 113/61 99/64 (HOB elevated; Map 75) 106/53 (HOB to 30 deg; Map 69) Pulse- Lying 98 95 89 Orthostatic Sitting BP- Sitting (!) 82/65 (Dizzy; Map 73) (!) 75/45 (bed to semi-chair, HOB elevated to 50 deg; Map 51) -- Pulse- Sitting 64 70 -- Orthostatic Standing at 0 minutes BP- Standing at 0 minutes (Multiple attempts semi-standing in stedy; Unabler to tolerate; difficulty relaxing arm, so BPs in late 200s/100s, suspect inaccurate; Very dizzy) -- --    Exam:  . General: 79  y.o. year-old male well developed well nourished in no acute distress.  Alert and oriented x3.  Pleasant . Cardiovascular: Regular rate and rhythm with no rubs or gallops.  No thyromegaly or JVD noted.   Marland Kitchen Respiratory: Clear to auscultation with no wheezes or rales. Good inspiratory effort. . Abdomen: Soft mildly tender nondistended with reduced bowel sounds x4 quadrants. . Musculoskeletal: No lower extremity edema. 2/4 pulses in all 4 extremities.  Tender right femoral thigh area patient has ice pack on it . Skin: No ulcerative lesions noted or rashes, . Psychiatry: Mood is appropriate for condition and setting    Data Reviewed: CBC: Recent Labs  Lab 03/08/20 1402 03/09/20 0222 03/10/20 0219 03/11/20 0255 03/14/20 0331  WBC 16.2* 9.8 8.4 8.3 7.3  NEUTROABS  --   --   --   --  4.8  HGB 10.1* 8.5* 7.5* 7.3* 7.0*  HCT 28.7* 24.0* 21.6* 22.1* 21.4*  MCV 86.4 85.4 86.1 87.7 89.2  PLT 214 190 144* 158 [id number]   Basic Metabolic Panel: Recent Labs  Lab 03/09/20 0222 03/10/20 0219  03/11/20 0255 03/14/20 0331 03/15/20 0427  NA 133* 130* 130* 131*  --   K 4.5 4.3 3.7 4.4  --   CL 98 97* 95* 95*  --   CO2 27 25 24 25   --   GLUCOSE 138* 124* 129* 125*  --   BUN 18 19 22  31*  --   CREATININE 1.31* 1.16 1.27* 1.25* 1.20  CALCIUM 8.4* 8.4* 8.2* 8.2*  --   MG  --   --  2.1  --   --    GFR: Estimated Creatinine Clearance: 59 mL/min (by C-G formula based on SCr of 1.2 mg/dL). Liver Function Tests: No results for input(s): AST, ALT, ALKPHOS, BILITOT, PROT, ALBUMIN in the last 168 hours. No results for input(s): LIPASE, AMYLASE in the last 168 hours. No results for input(s): AMMONIA in the last 168 hours. Coagulation Profile: No results for input(s): INR, PROTIME in the last 168 hours. Cardiac Enzymes: No results for input(s): CKTOTAL, CKMB, CKMBINDEX, TROPONINI in the last 168 hours. BNP (last 3 results) No results for input(s): PROBNP in the last 8760 hours. HbA1C: No results for input(s): HGBA1C in the last 72 hours. CBG: No results for input(s): GLUCAP in the last 168 hours. Lipid Profile: No results for input(s): CHOL, HDL, LDLCALC, TRIG, CHOLHDL, LDLDIRECT in the last 72 hours. Thyroid Function Tests: No results for input(s): TSH, T4TOTAL, FREET4, T3FREE, THYROIDAB in the last 72 hours. Anemia Panel: No results for input(s): VITAMINB12, FOLATE, FERRITIN, TIBC, IRON, RETICCTPCT in the last 72 hours. Urine analysis:    Component Value Date/Time   COLORURINE YELLOW 10/07/2019 [place] 10/07/2019 1256   LABSPEC 1.014 10/07/2019 1256   PHURINE 7.0 10/07/2019 1256   GLUCOSEU NEGATIVE 10/07/2019 1256   HGBUR MODERATE (A) 10/07/2019 1256   BILIRUBINUR NEGATIVE 10/07/2019 1256   KETONESUR NEGATIVE 10/07/2019 1256   PROTEINUR NEGATIVE 10/07/2019 1256   NITRITE NEGATIVE 10/07/2019 1256   LEUKOCYTESUR NEGATIVE 10/07/2019 1256   Sepsis Labs: @LABRCNTIP (procalcitonin:4,lacticidven:4)  ) Recent Results (from the past 240 hour(s))  Resp Panel  by RT-PCR (Flu A&B, Covid) Nasopharyngeal Swab     Status: None   Collection Time: 03/07/20 11:27 PM   Specimen: Nasopharyngeal Swab; Nasopharyngeal(NP) swabs in vial transport medium  Result Value Ref Range Status   SARS Coronavirus 2 by RT PCR NEGATIVE NEGATIVE Final    Comment: (NOTE) SARS-CoV-2 target nucleic acids are  NOT DETECTED.  The SARS-CoV-2 RNA is generally detectable in upper respiratory specimens during the acute phase of infection. The lowest concentration of SARS-CoV-2 viral copies this assay can detect is 138 copies/mL. A negative result does not preclude SARS-Cov-2 infection and should not be used as the sole basis for treatment or other patient management decisions. A negative result may occur with  improper specimen collection/handling, submission of specimen other than nasopharyngeal swab, presence of viral mutation(s) within the areas targeted by this assay, and inadequate number of viral copies(<138 copies/mL). A negative result must be combined with clinical observations, patient history, and epidemiological information. The expected result is Negative.  Fact Sheet for Patients:  EntrepreneurPulse.com.au  Fact Sheet for Healthcare Providers:  IncredibleEmployment.be  This test is no t yet approved or cleared by the [place] FDA and  has been authorized for detection and/or diagnosis of SARS-CoV-2 by FDA under an Emergency Use Authorization (EUA). This EUA will remain  in effect (meaning this test can be used) for the duration of the COVID-19 declaration under Section 564(b)(1) of the Act, 21 U.S.C.section 360bbb-3(b)(1), unless the authorization is terminated  or revoked sooner.       Influenza A by PCR NEGATIVE NEGATIVE Final   Influenza B by PCR NEGATIVE NEGATIVE Final    Comment: (NOTE) The Xpert Xpress SARS-CoV-2/FLU/RSV plus assay is intended as an aid in the diagnosis of influenza from Nasopharyngeal swab  specimens and should not be used as a sole basis for treatment. Nasal washings and aspirates are unacceptable for Xpert Xpress SARS-CoV-2/FLU/RSV testing.  Fact Sheet for Patients: EntrepreneurPulse.com.au  Fact Sheet for Healthcare Providers: IncredibleEmployment.be  This test is not yet approved or cleared by the [place] FDA and has been authorized for detection and/or diagnosis of SARS-CoV-2 by FDA under an Emergency Use Authorization (EUA). This EUA will remain in effect (meaning this test can be used) for the duration of the COVID-19 declaration under Section 564(b)(1) of the Act, 21 U.S.C. section 360bbb-3(b)(1), unless the authorization is terminated or revoked.  Performed at [facility], [place] [address], [place], [place] [address]   Surgical pcr screen     Status: None   Collection Time: 03/08/20  3:28 AM   Specimen: Nasal Mucosa; Nasal Swab  Result Value Ref Range Status   MRSA, PCR NEGATIVE NEGATIVE Final   Staphylococcus aureus NEGATIVE NEGATIVE Final    Comment: (NOTE) The Xpert SA Assay (FDA approved for NASAL specimens in patients 42 years of age and older), is one component of a comprehensive surveillance program. It is not intended to diagnose infection nor to guide or monitor treatment. Performed at [facility], [place] [address], [place], [place] [address]       Studies: No results found.  Scheduled Meds: . Chlorhexidine Gluconate Cloth  6 each Topical Daily  . cholecalciferol  2,000 Units Oral Daily  . docusate sodium  100 mg Oral BID  . enoxaparin (LOVENOX) injection  40 mg Subcutaneous Q24H  . feeding supplement  237 mL Oral BID BM  . gabapentin  300 mg Oral QID  . multivitamin with minerals  1 tablet Oral Daily  . oxyCODONE-acetaminophen  1 tablet Oral Q12H   And  . oxyCODONE  5 mg Oral Q12H  . pantoprazole  40 mg Oral Daily  . polyethylene glycol  17 g Oral BID  . QUEtiapine  400 mg Oral  QHS  . rOPINIRole  0.25 mg Oral QPM  . tamsulosin  0.4  mg Oral QPC breakfast    Continuous Infusions: . lactated ringers 100 mL/hr at 03/14/20 1515  . methocarbamol (ROBAXIN) IV Stopped (03/08/20 1323)     LOS: 8 days     Cristal Deer, MD [facility]  To reach me or the doctor on call, go to: [email or web address] Password [email or web address]  03/15/2020, 9:21 AM

## 2020-03-16 DIAGNOSIS — D649 Anemia, unspecified: Secondary | ICD-10-CM

## 2020-03-16 LAB — CBC WITH DIFFERENTIAL/PLATELET
Abs Immature Granulocytes: 0.09 10*3/uL — ABNORMAL HIGH (ref 0.00–0.07)
Basophils Absolute: 0 10*3/uL (ref 0.0–0.1)
Basophils Relative: 0 %
Eosinophils Absolute: 0.2 10*3/uL (ref 0.0–0.5)
Eosinophils Relative: 2 %
HCT: 19.3 % — ABNORMAL LOW (ref 39.0–52.0)
Hemoglobin: 6.1 g/dL — CL (ref 13.0–17.0)
Immature Granulocytes: 1 %
Lymphocytes Relative: 15 %
Lymphs Abs: 1.3 10*3/uL (ref 0.7–4.0)
MCH: 28.9 pg (ref 26.0–34.0)
MCHC: 31.6 g/dL (ref 30.0–36.0)
MCV: 91.5 fL (ref 80.0–100.0)
Monocytes Absolute: 0.9 10*3/uL (ref 0.1–1.0)
Monocytes Relative: 10 %
Neutro Abs: 6.1 10*3/uL (ref 1.7–7.7)
Neutrophils Relative %: 72 %
Platelets: 276 10*3/uL (ref 150–400)
RBC: 2.11 MIL/uL — ABNORMAL LOW (ref 4.22–5.81)
RDW: 14 % (ref 11.5–15.5)
WBC: 8.5 10*3/uL (ref 4.0–10.5)
nRBC: 0 % (ref 0.0–0.2)

## 2020-03-16 LAB — BASIC METABOLIC PANEL
Anion gap: 11 (ref 5–15)
BUN: 23 mg/dL (ref 8–23)
CO2: 23 mmol/L (ref 22–32)
Calcium: 8.1 mg/dL — ABNORMAL LOW (ref 8.9–10.3)
Chloride: 102 mmol/L (ref 98–111)
Creatinine, Ser: 1.09 mg/dL (ref 0.61–1.24)
GFR, Estimated: >60 mL/min (ref >60)
Glucose, Bld: 110 mg/dL — ABNORMAL HIGH (ref 70–99)
Potassium: 4.3 mmol/L (ref 3.5–5.1)
Sodium: 136 mmol/L (ref 135–145)

## 2020-03-16 LAB — PREPARE RBC (CROSSMATCH)

## 2020-03-16 LAB — HEMOGLOBIN AND HEMATOCRIT, BLOOD
HCT: 22.3 % — ABNORMAL LOW (ref 39.0–52.0)
Hemoglobin: 7.2 g/dL — ABNORMAL LOW (ref 13.0–17.0)

## 2020-03-16 MED ORDER — SODIUM CHLORIDE 0.9% IV SOLUTION
Freq: Once | INTRAVENOUS | Status: AC
Start: 2020-03-16 — End: 2020-03-16

## 2020-03-16 MED ORDER — GABAPENTIN 400 MG PO CAPS
400.0000 mg | ORAL_CAPSULE | Freq: Four times a day (QID) | ORAL | Status: DC
  Administered 2020-03-16 – 2020-03-17 (×4): 400 mg via ORAL
  Filled 2020-03-16 (×4): qty 1

## 2020-03-16 NOTE — Care Management Important Message (Signed)
Important Message  Patient Details  Name: Dustin [name] MRN: [id number] Date of Birth: 1941-01-13   Medicare Important Message Given:  Yes - Important Message mailed due to current National Emergency   Verbal consent obtained due to current National Emergency  Relationship to patient: Self Contact Name: Traveion Ruddock Call Date: 03/16/20  Time: 71 Phone: [phone] Outcome: No Answer/Busy Important Message mailed to: Patient address on file    Delorse Lek 03/16/2020, 3:11 PM

## 2020-03-16 NOTE — Progress Notes (Addendum)
PROGRESS NOTE  Dustin [name] OMB:[id number] DOB: [birth year] DOA: 03/07/2020 PCP: Darrow Bussing, MD  HPI/Recap of past 24 hours: Dustin Lemma Johnsonis a 80 y.o.malewithhistory of chronic anemia, neuropathy, chronic combined systolic and diastolic CHF, last EF of  40 to 45% with grade 1 diastolic dysfunction in 2017 presented to the hospital after having a fall after slipping on the ice.  Patient did not lose any consciousness but had significant pain in the right groin area.  In the ED, X-rays revealed acute comminuted fracture of right distal femur. Labs were significant for anemia. On-call orthopedic surgeon was consulted and the patient was admitted for further management of the right thigh distal femur fracture. COVID test was negative.    March 16, 2020: His hospital course has been complicated by orthostatic hypotension requiring IV fluids, anemia requiring blood transfusion, which may have been contributing to the orthostatic hypotension and ileus with constipation patient refused NG tube.  Subjective: March 16, 2020: Patient seen and examined at bedside patient finally had a large bowel movement this morning but complaining of abdominal discomfort. He also dropped his hemoglobin and had to receive 1 unit of packed RBC. Hemoglobin is not 7.2 after 1 unit  March 15, 2020: Patient seen and examined at bedside said he feels okay he did not sleep very well last night.  He is still n.p.o. except ice chips due to increased gaseous distention with ileus and gas pain or abdominal pain.Marland Kitchen  He does not have any BM bowel movement yet, he said he is just passing gas.  He was checked for fecal impaction and an RN reported no fecal impaction  March 14, 2020. Patient seen and examined at bedside.  He was complaining of increased gas and pain and requesting for more gas pills.  X-ray was ordered and it shows some mild ileus.  Also he had another episode of dizziness and hypotension during  physical therapy today  March 13, 2020: Seen and examined at bedside.  Patient complained of dizziness when physical therapy came to see him and he was noted to have low blood pressure pressure. Nurse complained he has not had any bowel movement for 6 days now despite MiraLAX and Colace, also complaining of gas pain  March 12, 2020 Seen and examined at bedside.  Patient had complained of gas pain still no BM  March 11, 2020: Patient seen and examined at bedside.  Complains of pain on the right femoral from his fracture .  He is status post internal fixator General and 19 2021.  Also he complains of not having bowel movement but then said he has not been eating much because he has poor appetite Also complained that at home before he even fell and had a fracture he was on pain medication: Percocet 10/325 1 tablet twice daily and oxycodone 20 mg 4 times daily and also morphine.  I reviewed his home meds , could only see the oxycodone, tramadol and the Percocet.  Currently he is on oxycodone 20 mg 4 times a day as needed, oxycodone IR 5 mg every 12 hours, Percocet 5/325 1 tablet every 12 hours And morphine 0.5 mg as needed pain   assessment/Plan: Principal Problem:   Femur fracture (HCC) Active Problems:   Peripheral axonal neuropathy   Hip fracture (HCC)  #1 comminuted fracture of distal right femur. 03/08/20 intramedullary nailing of right femur fracture;  waiting for rehab  2.  Chronic pain on long-term opiate therapy  3.  Constipation likely  from heavy dose opioid use.  He is already on MiraLAX daily as needed and docusate sodium 1 tablet twice daily.  I will change MiraLAX to twice daily I have ordered Dulcolax suppository this morning with no improvement.  I have added lactulose x1 tonight.  Also x-ray of the abdomen  4.  Chronic neuropathy patient is on gabapentin this will also help with his pain  5.  Orthostatic hypotension.  Patient had hypotension during physical therapy with  dizziness orthostatic blood pressure was nonaerated was low.  He was bolused with IV 500 cc normal saline his blood pressure is better.  We will maintain him at lactated Ringer's of 125 mils per hour. This has resolved with normal saline lactated Ringer solution and blood transfusion today.  6.  Abdominal pain/gassy distention secondary to ileus mild.  Patient will be kept n.p.o. except chips ordered NG tube but patient could not tolerate the NG tube and he refused. His mild ileus is resolved he now has normal bowel sounds and he made good bowel movement. He will start full liquid diet and will be advanced as tolerated  7.  Anemia hemoglobin is 6.1 today was 7.0.  He received blood transfusion overnight. Current hemoglobin is 7.2 posttransfusion. I will start him on iron by mouth. We will continue to monitor  8.  Mild hyponatremia 1 of 131 today it was 130 is improving  9.  AKI.  His BUN today is 31 and creatinine of 1.27 it was 1.16.  In addition for him having hypotension and his queen BUN and creatinine bumping up I will start increase his IV fluid 225/h lactated Ringer's.  46. Debility secondary to his knee surgery patient has his bedside spirometry but has not been using it he demonstrated today and he was able to blow up to 2500 patient encouraged to use it regularly  11. Neuropathy. Patient complaining of pins-and-needles he is currently on gabapentin 300 mg 4 times a day. He stated that he takes his 400 mg 4 times a day at home. I will change it to 400 mg 4 times a day  Code Status: DNR  Severity of Illness: The appropriate patient status for this patient is INPATIENT. Inpatient status is judged to be reasonable and necessary in order to provide the required intensity of service to ensure the patient's safety. The patient's presenting symptoms, physical exam findings, and initial radiographic and laboratory data in the context of their chronic comorbidities is felt to place them at high  risk for further clinical deterioration. Furthermore, it is not anticipated that the patient will be medically stable for discharge from the hospital within 2 midnights of admission. The following factors support the patient status of inpatient.   " The patient's presenting symptoms include fall with fracture. " The worrisome physical exam findings include fall with fracture. " The initial radiographic and laboratory data are worrisome because of requesting and waiting for rehab. " The chronic co-morbidities include chronic pain.   * I certify that at the point of admission it is my clinical judgment that the patient will require inpatient hospital care spanning beyond 2 midnights from the point of admission due to high intensity of service, high risk for further deterioration and high frequency of surveillance required.*    Family Communication: Patient  Disposition Plan: Likely discharge to rehab in the morning. He now has a bed but because he was complaining of abdominal pain and had just had a large bowel movement he was not  discharged today kept him overnight to monitor further and he also just started eating from his ileus. Waiting for rehab   Consultants:  Orthopedic  Procedures:  Right femur internal fixator  Antimicrobials:  None  DVT prophylaxis: Lovenox   Objective: Vitals:   03/16/20 0615 03/16/20 0640 03/16/20 0908 03/16/20 0925  BP: 134/89 (!) 143/70 (!) 124/92 121/60  Pulse: (!) 103 (!) 103 (!) 103 86  Resp: 15 16 16 16   Temp: 100.3 F (37.9 C) 100.3 F (37.9 C) 98.9 F (37.2 C) 98.9 F (37.2 C)  TempSrc: Oral Oral Oral Oral  SpO2: 93% 94% 96% 90%  Weight:      Height:        Intake/Output Summary (Last 24 hours) at 03/16/2020 1040 Last data filed at 03/16/2020 0731 Gross per 24 hour  Intake 315 ml  Output 1600 ml  Net -1285 ml   Filed Weights   03/07/20 1654 03/08/20 0245  Weight: 91.6 kg 94.5 kg   Body mass index is 26.75 kg/m.  PER  PT:03/13/20 1005 03/13/20 1006 03/13/20 1008 Vital Signs Patient Position (if appropriate) Orthostatic Vitals -- -- Orthostatic Lying BP- Lying 113/61 99/64 (HOB elevated; Map 75) 106/53 (HOB to 30 deg; Map 69) Pulse- Lying 98 95 89 Orthostatic Sitting BP- Sitting (!) 82/65 (Dizzy; Map 73) (!) 75/45 (bed to semi-chair, HOB elevated to 50 deg; Map 51) -- Pulse- Sitting 64 70 -- Orthostatic Standing at 0 minutes BP- Standing at 0 minutes (Multiple attempts semi-standing in stedy; Unabler to tolerate; difficulty relaxing arm, so BPs in late 200s/100s, suspect inaccurate; Very dizzy) -- --    Exam:  . General: 80 y.o. year-old male well developed well nourished in no acute distress.  Alert and oriented x3.  Pleasant . Cardiovascular: Regular rate and rhythm with no rubs or gallops.  No thyromegaly or JVD noted.   Marland Kitchen Respiratory: Clear to auscultation with no wheezes or rales. Good inspiratory effort. . Abdomen: Soft mildly tender nondistended with normal bowel sounds x4 quadrants. . Musculoskeletal: No lower extremity edema. 2/4 pulses in all 4 extremities.  Tender right femoral thigh area patient has ice pack on it . Skin: No ulcerative lesions noted or rashes, . Psychiatry: Mood is appropriate for condition and setting    Data Reviewed: CBC: Recent Labs  Lab 03/10/20 0219 03/11/20 0255 03/14/20 0331 03/16/20 0113  WBC 8.4 8.3 7.3 8.5  NEUTROABS  --   --  4.8 6.1  HGB 7.5* 7.3* 7.0* 6.1*  HCT 21.6* 22.1* 21.4* 19.3*  MCV 86.1 87.7 89.2 91.5  PLT 144* 158 228 355   Basic Metabolic Panel: Recent Labs  Lab 03/10/20 0219 03/11/20 0255 03/14/20 0331 03/15/20 0427 03/16/20 0113  NA 130* 130* 131*  --  136  K 4.3 3.7 4.4  --  4.3  CL 97* 95* 95*  --  102  CO2 25 24 25   --  23  GLUCOSE 124* 129* 125*  --  110*  BUN 19 22 31*  --  23  CREATININE 1.16 1.27* 1.25* 1.20 1.09  CALCIUM 8.4* 8.2* 8.2*  --  8.1*  MG  --  2.1  --   --   --    GFR: Estimated Creatinine Clearance: 64.9  mL/min (by C-G formula based on SCr of 1.09 mg/dL). Liver Function Tests: No results for input(s): AST, ALT, ALKPHOS, BILITOT, PROT, ALBUMIN in the last 168 hours. No results for input(s): LIPASE, AMYLASE in the last 168 hours. No results for input(s):  AMMONIA in the last 168 hours. Coagulation Profile: No results for input(s): INR, PROTIME in the last 168 hours. Cardiac Enzymes: No results for input(s): CKTOTAL, CKMB, CKMBINDEX, TROPONINI in the last 168 hours. BNP (last 3 results) No results for input(s): PROBNP in the last 8760 hours. HbA1C: No results for input(s): HGBA1C in the last 72 hours. CBG: No results for input(s): GLUCAP in the last 168 hours. Lipid Profile: No results for input(s): CHOL, HDL, LDLCALC, TRIG, CHOLHDL, LDLDIRECT in the last 72 hours. Thyroid Function Tests: No results for input(s): TSH, T4TOTAL, FREET4, T3FREE, THYROIDAB in the last 72 hours. Anemia Panel: No results for input(s): VITAMINB12, FOLATE, FERRITIN, TIBC, IRON, RETICCTPCT in the last 72 hours. Urine analysis:    Component Value Date/Time   COLORURINE YELLOW 10/07/2019 [place] 10/07/2019 1256   LABSPEC 1.014 10/07/2019 1256   PHURINE 7.0 10/07/2019 1256   GLUCOSEU NEGATIVE 10/07/2019 1256   HGBUR MODERATE (A) 10/07/2019 1256   BILIRUBINUR NEGATIVE 10/07/2019 1256   KETONESUR NEGATIVE 10/07/2019 1256   PROTEINUR NEGATIVE 10/07/2019 1256   NITRITE NEGATIVE 10/07/2019 1256   LEUKOCYTESUR NEGATIVE 10/07/2019 1256   Sepsis Labs: @LABRCNTIP (procalcitonin:4,lacticidven:4)  ) Recent Results (from the past 240 hour(s))  Resp Panel by RT-PCR (Flu A&B, Covid) Nasopharyngeal Swab     Status: None   Collection Time: 03/07/20 11:27 PM   Specimen: Nasopharyngeal Swab; Nasopharyngeal(NP) swabs in vial transport medium  Result Value Ref Range Status   SARS Coronavirus 2 by RT PCR NEGATIVE NEGATIVE Final    Comment: (NOTE) SARS-CoV-2 target nucleic acids are NOT DETECTED.  The  SARS-CoV-2 RNA is generally detectable in upper respiratory specimens during the acute phase of infection. The lowest concentration of SARS-CoV-2 viral copies this assay can detect is 138 copies/mL. A negative result does not preclude SARS-Cov-2 infection and should not be used as the sole basis for treatment or other patient management decisions. A negative result may occur with  improper specimen collection/handling, submission of specimen other than nasopharyngeal swab, presence of viral mutation(s) within the areas targeted by this assay, and inadequate number of viral copies(<138 copies/mL). A negative result must be combined with clinical observations, patient history, and epidemiological information. The expected result is Negative.  Fact Sheet for Patients:  EntrepreneurPulse.com.au  Fact Sheet for Healthcare Providers:  IncredibleEmployment.be  This test is no t yet approved or cleared by the [place] FDA and  has been authorized for detection and/or diagnosis of SARS-CoV-2 by FDA under an Emergency Use Authorization (EUA). This EUA will remain  in effect (meaning this test can be used) for the duration of the COVID-19 declaration under Section 564(b)(1) of the Act, 21 U.S.C.section 360bbb-3(b)(1), unless the authorization is terminated  or revoked sooner.       Influenza A by PCR NEGATIVE NEGATIVE Final   Influenza B by PCR NEGATIVE NEGATIVE Final    Comment: (NOTE) The Xpert Xpress SARS-CoV-2/FLU/RSV plus assay is intended as an aid in the diagnosis of influenza from Nasopharyngeal swab specimens and should not be used as a sole basis for treatment. Nasal washings and aspirates are unacceptable for Xpert Xpress SARS-CoV-2/FLU/RSV testing.  Fact Sheet for Patients: EntrepreneurPulse.com.au  Fact Sheet for Healthcare Providers: IncredibleEmployment.be  This test is not yet approved or  cleared by the [place] FDA and has been authorized for detection and/or diagnosis of SARS-CoV-2 by FDA under an Emergency Use Authorization (EUA). This EUA will remain in effect (meaning this test can be used) for the duration  of the COVID-19 declaration under Section 564(b)(1) of the Act, 21 U.S.C. section 360bbb-3(b)(1), unless the authorization is terminated or revoked.  Performed at [facility], [place] [address], [place], [place] [address]   Surgical pcr screen     Status: None   Collection Time: 03/08/20  3:28 AM   Specimen: Nasal Mucosa; Nasal Swab  Result Value Ref Range Status   MRSA, PCR NEGATIVE NEGATIVE Final   Staphylococcus aureus NEGATIVE NEGATIVE Final    Comment: (NOTE) The Xpert SA Assay (FDA approved for NASAL specimens in patients 34 years of age and older), is one component of a comprehensive surveillance program. It is not intended to diagnose infection nor to guide or monitor treatment. Performed at [facility], [place] [address] [place], [place] [address]   SARS CORONAVIRUS 2 (TAT 6-24 HRS) Nasopharyngeal Nasopharyngeal Swab     Status: None   Collection Time: 03/15/20 12:42 PM   Specimen: Nasopharyngeal Swab  Result Value Ref Range Status   SARS Coronavirus 2 NEGATIVE NEGATIVE Final    Comment: (NOTE) SARS-CoV-2 target nucleic acids are NOT DETECTED.  The SARS-CoV-2 RNA is generally detectable in upper and lower respiratory specimens during the acute phase of infection. Negative results do not preclude SARS-CoV-2 infection, do not rule out co-infections with other pathogens, and should not be used as the sole basis for treatment or other patient management decisions. Negative results must be combined with clinical observations, patient history, and epidemiological information. The expected result is Negative.  Fact Sheet for Patients: SugarRoll.be  Fact Sheet for Healthcare  Providers: https://www.woods-mathews.com/  This test is not yet approved or cleared by the [place] FDA and  has been authorized for detection and/or diagnosis of SARS-CoV-2 by FDA under an Emergency Use Authorization (EUA). This EUA will remain  in effect (meaning this test can be used) for the duration of the COVID-19 declaration under Se ction 564(b)(1) of the Act, 21 U.S.C. section 360bbb-3(b)(1), unless the authorization is terminated or revoked sooner.  Performed at [facility], [place] [address] [place], [place] [address]       Studies: No results found.  Scheduled Meds: . Chlorhexidine Gluconate Cloth  6 each Topical Daily  . cholecalciferol  2,000 Units Oral Daily  . docusate sodium  100 mg Oral BID  . enoxaparin (LOVENOX) injection  40 mg Subcutaneous Q24H  . feeding supplement  237 mL Oral BID BM  . gabapentin  300 mg Oral QID  . multivitamin with minerals  1 tablet Oral Daily  . oxyCODONE-acetaminophen  1 tablet Oral Q12H   And  . oxyCODONE  5 mg Oral Q12H  . pantoprazole  40 mg Oral Daily  . polyethylene glycol  17 g Oral BID  . QUEtiapine  400 mg Oral QHS  . rOPINIRole  0.25 mg Oral QPM  . tamsulosin  0.4 mg Oral QPC breakfast    Continuous Infusions: . lactated ringers 125 mL/hr at 03/16/20 0225  . methocarbamol (ROBAXIN) IV Stopped (03/08/20 1323)     LOS: 9 days     Cristal Deer, MD [facility]  To reach me or the doctor on call, go to: [email or web address] Password [email or web address]  03/16/2020, 10:40 AM

## 2020-03-16 NOTE — TOC Progression Note (Signed)
Transition of Care [facility]) - Progression Note    Patient Details  Name: Dustin [name] MRN: [id number] Date of Birth: [birth year]  Transition of Care [facility]) CM/SW [place],  Phone Number: 03/16/2020, 1:41 PM  Clinical Narrative:   CSW spoke with patient's daughter-in-law, Manuela Schwartz, that they would like to choose [place]. CSW confirmed that [facility] will be able to take the patient when he's medically stable, potentially tomorrow if he's ready. CSW to follow.    Expected Discharge Plan: [place] Barriers to Discharge: Continued Medical Work up,Insurance Authorization,SNF Pending bed offer  Expected Discharge Plan and Services Expected Discharge Plan: [place]   Discharge Planning Services: CM Consult   Living arrangements for the past 2 months: Single Family Home                                       Social Determinants of Health (SDOH) Interventions    Readmission Risk Interventions No flowsheet data found.

## 2020-03-16 NOTE — Progress Notes (Signed)
Lab is at the bedside drawing type and screen at this time.

## 2020-03-16 NOTE — Progress Notes (Addendum)
Nutrition Follow-up  DOCUMENTATION CODES:   Not applicable  INTERVENTION:   -Continue Ensure Enlive po BID, each supplement provides 350 kcal and 20 grams of protein -Magic cup TID with meals, each supplement provides 290 kcal and 9 grams of protein -Continue MVI with minerals daily  NUTRITION DIAGNOSIS:   Increased nutrient needs related to post-op healing as evidenced by estimated needs.  Ongoing  GOAL:   Patient will meet greater than or equal to 90% of their needs  Progressing  MONITOR:   PO intake,Supplement acceptance,Labs,Weight trends,Skin,I & O's,Diet advancement  REASON FOR ASSESSMENT:   Consult Assessment of nutrition requirement/status,Hip fracture protocol  ASSESSMENT:   Dustin [name] is a 80 y.o. male with history of chronic anemia, neuropathy, chronic combined systolic and diastolic CHF last EF measured was 40 to 45% with grade 1 diastolic dysfunction in 0962 had a fall at home after slipping on ice.  Did not lose consciousness.  Has significant pain in the right thigh area.  1/19- s/p Procedures: CPT 27506-Retrograde intramedullary nailing of right femur fracture  Reviewed I/O's:-385 ml x 24 hours and -5.1 L since admission  UOP: 700 ml x 24 hours  Pt unavailable at time of visit.   Pt with variable intake. Noted meal completion 10-100%. Pt is intermittently accepting of Ensure Enlive supplements.   Per chart review, plan to d/c to SNF once medically stable (possibly tomorrow).   Medications reviewed and include colace, vitamin D3, miralax, and lactated ringers @ 125 ml/hr.   Labs reviewed.   Diet Order:   Diet Order            Diet full liquid Room service appropriate? Yes; Fluid consistency: Thin  Diet effective now                 EDUCATION NEEDS:   No education needs have been identified at this time  Skin:  Skin Assessment: Skin Integrity Issues: Skin Integrity Issues:: Incisions Incisions: closed rt leg  Last BM:   03/13/20  Height:   Ht Readings from Last 1 Encounters:  03/07/20 6\' 2"  (1.88 m)    Weight:   Wt Readings from Last 1 Encounters:  03/08/20 94.5 kg    Ideal Body Weight:  86.4 kg  BMI:  Body mass index is 26.75 kg/m.  Estimated Nutritional Needs:   Kcal:  2200-2400  Protein:  120-135 grams  Fluid:  > 2 L    Loistine Chance, RD, LDN, [place] Registered Dietitian II Certified Diabetes Care and Education Specialist Please refer to [facility] for RD and/or RD on-call/weekend/after hours pager

## 2020-03-16 NOTE — Progress Notes (Signed)
Notified [facility] on call that pt's hemoglobin came back at 6.1

## 2020-03-16 NOTE — Progress Notes (Signed)
Spoke with pt about low hemoglobin level. I let him know that I paged the doctor. He stated he understands that he may need blood transfusion and type and screen.

## 2020-03-16 NOTE — Progress Notes (Signed)
PT Cancellation Note  Patient Details Name: Dustin [name] MRN: [id number] DOB: [birth year]   Cancelled Treatment:    Reason Eval/Treat Not Completed: Medical issues which prohibited therapy Patient Hgb 6.1 this AM and to receive transfusion. PT will follow as Hgb improves and as time allows.   Danity Schmelzer A. Gilford Rile PT, DPT Acute Rehabilitation Services Pager [phone] Office [phone]    [name] 03/16/2020, 7:44 AM

## 2020-03-17 DIAGNOSIS — D62 Acute posthemorrhagic anemia: Secondary | ICD-10-CM | POA: Clinically undetermined

## 2020-03-17 DIAGNOSIS — S72301D Unspecified fracture of shaft of right femur, subsequent encounter for closed fracture with routine healing: Secondary | ICD-10-CM

## 2020-03-17 DIAGNOSIS — N401 Enlarged prostate with lower urinary tract symptoms: Secondary | ICD-10-CM

## 2020-03-17 DIAGNOSIS — R339 Retention of urine, unspecified: Secondary | ICD-10-CM

## 2020-03-17 LAB — BPAM RBC
Blood Product Expiration Date: 202202192359
ISSUE DATE / TIME: 202201270605
Unit Type and Rh: 6200

## 2020-03-17 LAB — TYPE AND SCREEN
ABO/RH(D): A POS
Antibody Screen: NEGATIVE
Unit division: 0

## 2020-03-17 MED ORDER — ADULT MULTIVITAMIN W/MINERALS CH: 1.0000 | ORAL_TABLET | Freq: Every day | ORAL | Status: DC

## 2020-03-17 MED ORDER — PANTOPRAZOLE SODIUM 40 MG PO TBEC: 40.0000 mg | DELAYED_RELEASE_TABLET | Freq: Every day | ORAL | Status: AC

## 2020-03-17 MED ORDER — QUETIAPINE FUMARATE 400 MG PO TABS: 400.0000 mg | ORAL_TABLET | Freq: Every day | ORAL | Status: DC

## 2020-03-17 MED ORDER — POLYETHYLENE GLYCOL 3350 17 G PO PACK: 17.0000 g | PACK | Freq: Two times a day (BID) | ORAL | 0 refills | Status: DC

## 2020-03-17 MED ORDER — SIMETHICONE 80 MG PO CHEW: 80.0000 mg | CHEWABLE_TABLET | Freq: Four times a day (QID) | ORAL | 0 refills | Status: DC | PRN

## 2020-03-17 MED ORDER — ENSURE ENLIVE PO LIQD: 237.0000 mL | Freq: Two times a day (BID) | ORAL | 12 refills | Status: DC

## 2020-03-17 MED ORDER — ONDANSETRON HCL 4 MG PO TABS: 4.0000 mg | ORAL_TABLET | Freq: Four times a day (QID) | ORAL | 0 refills | Status: DC | PRN

## 2020-03-17 MED ORDER — BISACODYL 10 MG RE SUPP: 10.0000 mg | Freq: Every day | RECTAL | 0 refills | Status: DC | PRN

## 2020-03-17 MED ORDER — TAMSULOSIN HCL 0.4 MG PO CAPS: 0.4000 mg | ORAL_CAPSULE | Freq: Every day | ORAL | 0 refills | Status: DC

## 2020-03-17 MED ORDER — MELATONIN 10 MG PO TABS: 10.0000 mg | ORAL_TABLET | Freq: Every evening | ORAL | 0 refills | Status: DC | PRN

## 2020-03-17 NOTE — Progress Notes (Signed)
Discharge summary packet/pertinent documents provided to [facility] staff. Pt remains alert/oriented in no apparent distress. D/C to [facility] as ordered. Pt has no complaints.

## 2020-03-17 NOTE — TOC Transition Note (Signed)
Transition of Care [facility]) - CM/SW Discharge Note   Patient Details  Name: Dustin [name] MRN: [id number] Date of Birth: April 29, 1940  [facility]) CM/SW Contact:  Geralynn Ochs, LCSW Phone Number: 03/17/2020, 3:21 PM   Clinical Narrative:   Nurse to call report to [phone], Room 701P.    Final next level of care: Skilled Nursing Facility Barriers to Discharge: Barriers Resolved   Patient Goals and CMS Choice Patient states their goals for this hospitalization and ongoing recovery are:: Per spouse"want hime to be able to walk and come home". CMS Medicare.gov Compare Post Acute Care list provided to:: Other (Comment Required) (pt's spouse and daughter in law) Choice offered to / list presented to : [name]  Discharge Placement              Patient chooses bed at: [name] Patient to be transferred to facility by: [place] Name of family member notified: Manuela Schwartz Patient and family notified of of transfer: 03/17/20  Discharge Plan and Services   Discharge Planning Services: CM Consult                                 Social Determinants of Health (SDOH) Interventions     Readmission Risk Interventions No flowsheet data found.

## 2020-03-17 NOTE — Discharge Summary (Signed)
Discharge Summary  Dustin [name] [id number] DOB: April 25, 1940  PCP: Lujean Amel, MD  Admit date: 03/07/2020 Discharge date: 03/17/2020  Time spent: 35 minutes  Recommendations for Outpatient Follow-up:  1. Orthopedic postop follow-up 2. Urology 3. PCP  Discharge Diagnoses:  Active Hospital Problems   Diagnosis Date Noted  . Femur fracture ([place]) 03/07/2020  . Acute posthemorrhagic anemia 03/17/2020  . Hip fracture ([place]) 03/07/2020  . Idiopathic peripheral neuropathy   . Chronic combined systolic (congestive) and diastolic (congestive) heart failure ([place])   . Gait instability 01/22/2016  . Urinary retention   . BPH (benign prostatic hyperplasia) 05/02/2015  . 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    Discharge Condition: Improved  Diet recommendation: Regular  Vitals:   03/17/20 1230 03/17/20 1240  BP: 120/78   Pulse: (!) 118 (!) 107  Resp: 19   Temp: 98.6 F (37 C)   SpO2: 98%     History of present illness:  Dustin [name] a 79 y.o.malewithhistory of chronic anemia, neuropathy, chronic combined systolic and diastolic CHF, last EF of 40 to 45% with grade 1 diastolic dysfunction in 0000000 presented to the hospital after having a fall after slipping on the ice. Patient did not lose any consciousness but had significant pain in the right groin area.In the ED,X-rays revealed acute comminuted fracture of right distal femur. Labs were significant for anemia. On-call orthopedic surgeon was consulted and the patient was admitted for further management of the right thigh distal femur fracture. COVID test was negative  Hospital Course:  Principal Problem:   Femur fracture ([place]) Active Problems:   Peripheral axonal neuropathy   BPH (benign prostatic hyperplasia)   Gait instability   Urinary retention   Chronic combined systolic (congestive) and diastolic (congestive) heart failure  ([facility])   Idiopathic peripheral neuropathy   Hip fracture ([facility])   Acute posthemorrhagic anemia  His hospital course has been complicated by orthostatic hypotension requiring IV fluids, anemia requiring blood transfusion, which may have been contributing to the orthostatic hypotension and ileus with constipation patient refused NG tube So he has a history of BPH status post TURP about 8 months ago with improvement. On admission he had a Foley catheter placed in, attempt to discontinue catheter was not successful as he continued to retain over 600 cc of urine the Foley catheter was put back. He will need to follow-up with his urologist as outpatient  1 comminuted fracture of distal right femur. 03/08/20 intramedullary nailing of right femur fracture; for rehab  2.  Chronic pain on long-term opiate therapy  3.  Constipation likely from heavy dose opioid use.  He is already on MiraLAX daily as needed and docusate sodium 1 tablet twice daily.  I will change MiraLAX to twice daily I have ordered Dulcolax suppository this morning with no improvement.  I have added lactulose x1 tonight.  Also x-ray of the abdomen  4.  Chronic neuropathy patient is on gabapentin this will also help with his pain  5.  Orthostatic hypotension.  Patient had hypotension during physical therapy with dizziness orthostatic blood pressure was nonaerated was low.  He was bolused with IV 500 cc normal saline his blood pressure is better.  We will maintain him at lactated Ringer's of 125 mils per hour. This has resolved with normal saline lactated Ringer solution and blood transfusion today.  6.  Abdominal pain/gassy distention secondary to ileus mild.  Patient will be kept n.p.o.  except chips ordered NG tube but patient could not tolerate the NG tube and he refused. His mild ileus is resolved he now has normal bowel sounds and he made good bowel movement. He will start full liquid diet and will be advanced as tolerated  7.   Anemia hemoglobin is 6.1 today was 7.0.  He received blood transfusion overnight. Current hemoglobin is 7.2 posttransfusion. I will start him on iron by mouth. We will continue to monitor  8.  Mild hyponatremia 1 of 131 today it was 130 is improving  9.  AKI.  His BUN today is 31 and creatinine of 1.27 it was 1.16.  In addition for him having hypotension and his queen BUN and creatinine bumping up I will start increase his IV fluid 225/h lactated Ringer's.  37. Debility secondary to his knee surgery patient has his bedside spirometry but has not been using it he demonstrated today and he was able to blow up to 2500 patient encouraged to use it regularly  11. Neuropathy. Patient complaining of pins-and-needles he is currently on gabapentin 300 mg 4 times a day. He stated that he takes his 400 mg 4 times a day at home. I will change it to 400 mg 4 times a day  Patient is DNR  Procedures: 03/08/20 intramedullary nailing of right femur   Consultations:  Orthopedic  Discharge Exam: BP 120/78 (BP Location: Right Arm)   Pulse (!) 107   Temp 98.6 F (37 C) (Oral)   Resp 19   Ht 6\' 2"  (1.88 m)   Wt 94.5 kg   SpO2 98%   BMI 26.75 kg/m   General: Alert oriented x3 well-nourished pleasant Cardiovascular: Regular rate and rhythm no murmur Respiratory: Clear to auscultation Musculoskeletal: Pain with ambulation due to recent surgery no edema  Discharge Instructions You were cared for by a hospitalist during your hospital stay. If you have any questions about your discharge medications or the care you received while you were in the hospital after you are discharged, you can call the unit and asked to speak with the hospitalist on call if the hospitalist that took care of you is not available. Once you are discharged, your primary care physician will handle any further medical issues. Please note that NO REFILLS for any discharge medications will be authorized once you are discharged, as it  is imperative that you return to your primary care physician (or establish a relationship with a primary care physician if you do not have one) for your aftercare needs so that they can reassess your need for medications and monitor your lab values.  Discharge Instructions    Diet - low sodium heart healthy   Complete by: As directed    Discharge instructions   Complete by: As directed    For rehab   Discharge wound care:   Complete by: As directed    Per orthopedic   Surgical wound side. Orthopedic   Increase activity slowly   Complete by: As directed      Allergies as of 03/17/2020   No Known Allergies     Medication List    STOP taking these medications   gabapentin 300 MG capsule Commonly known as: NEURONTIN Replaced by: gabapentin 600 MG tablet   oxyCODONE-acetaminophen 10-325 MG tablet Commonly known as: PERCOCET Replaced by: oxyCODONE-acetaminophen 5-325 MG tablet   rOPINIRole 0.25 MG tablet Commonly known as: REQUIP   silodosin 8 MG Caps capsule Commonly known as: RAPAFLO   Sodium Fluoride 5000  PPM 1.1 % Pste Generic drug: Sodium Fluoride   Tandem 162-115.2 MG Caps capsule Generic drug: ferrous fumarate-iron polysaccharide complex   traMADol 50 MG tablet Commonly known as: ULTRAM     TAKE these medications   bisacodyl 10 MG suppository Commonly known as: DULCOLAX Place 1 suppository (10 mg total) rectally daily as needed for moderate constipation.   docusate sodium 100 MG capsule Commonly known as: COLACE Take 1 capsule (100 mg total) by mouth 2 (two) times daily.   enoxaparin 40 MG/0.4ML injection Commonly known as: LOVENOX Inject 0.4 mLs (40 mg total) into the skin daily for 28 days.   feeding supplement Liqd Take 237 mLs by mouth 2 (two) times daily between meals.   gabapentin 600 MG tablet Commonly known as: Neurontin Take 1 tablet (600 mg total) by mouth in the morning, at noon, in the evening, and at bedtime for 7 days. Replaces:  gabapentin 300 MG capsule   Melatonin 10 MG Tabs Take 10 mg by mouth at bedtime as needed (sleep).   methocarbamol 500 MG tablet Commonly known as: ROBAXIN Take 1 tablet (500 mg total) by mouth every 6 (six) hours as needed for muscle spasms.   multivitamin with minerals Tabs tablet Take 1 tablet by mouth daily. Start taking on: March 18, 2020   ondansetron 4 MG tablet Commonly known as: ZOFRAN Take 1 tablet (4 mg total) by mouth every 6 (six) hours as needed for nausea.   Oxycodone HCl 20 MG Tabs Take 1 tablet (20 mg total) by mouth 4 (four) times daily as needed. What changed: reasons to take this   oxyCODONE 5 MG immediate release tablet Commonly known as: Oxy IR/ROXICODONE Take 1 tablet (5 mg total) by mouth every 12 (twelve) hours. What changed: You were already taking a medication with the same name, and this prescription was added. Make sure you understand how and when to take each.   oxyCODONE-acetaminophen 5-325 MG tablet Commonly known as: PERCOCET/ROXICET Take 1 tablet by mouth every 12 (twelve) hours. Replaces: oxyCODONE-acetaminophen 10-325 MG tablet   pantoprazole 40 MG tablet Commonly known as: PROTONIX Take 1 tablet (40 mg total) by mouth daily. Start taking on: March 18, 2020   polyethylene glycol 17 g packet Commonly known as: MIRALAX / GLYCOLAX Take 17 g by mouth 2 (two) times daily.   QUEtiapine 400 MG tablet Commonly known as: SEROQUEL Take 1 tablet (400 mg total) by mouth at bedtime.   simethicone 80 MG chewable tablet Commonly known as: MYLICON Chew 1 tablet (80 mg total) by mouth 4 (four) times daily as needed for flatulence.   tamsulosin 0.4 MG Caps capsule Commonly known as: FLOMAX Take 1 capsule (0.4 mg total) by mouth daily after breakfast. Start taking on: March 18, 2020   Vitamin D3 25 MCG tablet Commonly known as: Vitamin D Take 2 tablets (2,000 Units total) by mouth daily.            Durable Medical Equipment  (From  admission, onward)         Start     Ordered   03/17/20 1341  DME 3-in-1  Once        03/17/20 1344   03/17/20 1341  DME Walker  Once       Question Answer Comment  Walker: With 5 Inch Wheels   Patient needs a walker to treat with the following condition Fracture (healed) treatment follow-up      03/17/20 1344   03/17/20 1008  For home  use only DME Walker rolling  Once       Question Answer Comment  Walker: With 5 Inch Wheels   Patient needs a walker to treat with the following condition Intertrochanteric fracture of left hip, closed, initial encounter [facility])      03/17/20 1008           Discharge Care Instructions  (From admission, onward)         Start     Ordered   03/17/20 0000  Discharge wound care:       Comments: Per orthopedic   03/17/20 1344         No Known Allergies  Contact information for follow-up providers    Haddix, Thomasene Lot, MD. Schedule an appointment as soon as possible for a visit in 2 week(s).   Specialty: Orthopedic Surgery Why: for repeat x-rays and wound check Contact information: [place] [address] [phone]            Contact information for after-discharge care    Destination    [facility] .   Service: Skilled Nursing Contact information: [place] [phone]                   The results of significant diagnostics from this hospitalization (including imaging, microbiology, ancillary and laboratory) are listed below for reference.    Significant Diagnostic Studies: DG Abd 1 View  Result Date: 03/13/2020 CLINICAL DATA:  Abdomen pain and gas in surgery EXAM: ABDOMEN - 1 VIEW COMPARISON:  None. FINDINGS: Mild diffuse increased small and large bowel gas. No definitive obstruction pattern identified. Moderate retained stool at the rectum. No radiopaque calculi. IMPRESSION: Mild diffuse increased small and large bowel gas, suggestive of mild  ileus. Moderate retained stool at the rectum. Electronically Signed   By: Donavan Foil M.D.   On: 03/13/2020 17:23   CT Head Wo Contrast  Result Date: 03/07/2020 CLINICAL DATA:  Head trauma fell on ice EXAM: CT HEAD WITHOUT CONTRAST TECHNIQUE: Contiguous axial images were obtained from the base of the skull through the vertex without intravenous contrast. COMPARISON:  CT brain 02/09/2016 FINDINGS: Brain: No acute territorial infarction, hemorrhage, or intracranial mass. Mild atrophy. Mild hypodensity in the white matter consistent with chronic small vessel ischemic change. Stable ventricle size Vascular: No hyperdense vessels.  No unexpected calcification Skull: Normal. Negative for fracture or focal lesion. Sinuses/Orbits: No acute finding. Other: None IMPRESSION: 1. No CT evidence for acute intracranial abnormality. 2. Atrophy and mild chronic small vessel ischemic changes of the white matter. Electronically Signed   By: Donavan Foil M.D.   On: 03/07/2020 21:13   CT Cervical Spine Wo Contrast  Result Date: 03/07/2020 CLINICAL DATA:  Fall EXAM: CT CERVICAL SPINE WITHOUT CONTRAST TECHNIQUE: Multidetector CT imaging of the cervical spine was performed without intravenous contrast. Multiplanar CT image reconstructions were also generated. COMPARISON:  MRI 01/23/2016, radiograph 10/27/2012 FINDINGS: Alignment: 3 mm anterolisthesis C7 on T1. Facet alignment is maintained Skull base and vertebrae: No acute fracture. No primary bone lesion or focal pathologic process. Soft tissues and spinal canal: No prevertebral fluid or swelling. No visible canal hematoma. Disc levels: Post fusion changes C3 through C7 with anterior plate, fixating screws and interbody devices. Advanced degenerative change at C7-T1. Facet degeneration at multiple levels. Upper chest: Apical emphysema and scarring. Other: None IMPRESSION: Post fusion changes C3 through C7. No acute osseous abnormality. Stable 3 mm anterolisthesis C7 on  T1.  Electronically Signed   By: Donavan Foil M.D.   On: 03/07/2020 21:20   DG Pelvis Portable  Result Date: 03/07/2020 CLINICAL DATA:  Fall right leg pain EXAM: PORTABLE PELVIS 1-2 VIEWS COMPARISON:  MRI 12/24/2017 FINDINGS: SI joints are non widened. Pubic symphysis and rami appear intact. No fracture or malalignment. Degenerative changes of the hips. Post treatment changes in the region of the prostate IMPRESSION: No acute osseous abnormality. Electronically Signed   By: Donavan Foil M.D.   On: 03/07/2020 18:25   DG Chest Port 1 View  Result Date: 03/07/2020 CLINICAL DATA:  Fall with right pain EXAM: PORTABLE CHEST 1 VIEW COMPARISON:  10/07/2019, 12/30/2017 FINDINGS: Surgical hardware in the cervical spine. Tubular artifact over the left upper chest. Possible right apical opacity. Stable cardiomediastinal silhouette. No pneumothorax. IMPRESSION: Possible right apical airspace disease, question upper lobe atelectasis. Two-view chest radiographic follow-up suggested. Electronically Signed   By: Donavan Foil M.D.   On: 03/07/2020 18:22   DG C-Arm 1-60 Min  Result Date: 03/08/2020 CLINICAL DATA:  Retrograde IM nail of right femur. EXAM: DG C-ARM 1-60 MIN; RIGHT FEMUR 2 VIEWS FLUOROSCOPY TIME:  Fluoroscopy Time:  2 minutes and 12 seconds. Port radiation: 17.36 mGy. COMPARISON:  Radiographs 03/07/2020 FINDINGS: Eight C-arm fluoroscopic images were obtained intraoperatively and submitted for post operative interpretation. These images demonstrate surgical changes of intramedullary nail and screw fixation of a distal femur fracture with improved alignment. Multiple mildly displaced butterfly fragments are noted. No unexpected findings. Please see the performing provider's procedural report for further detail. IMPRESSION: Intraoperative fluoroscopic imaging, as detailed above. Electronically Signed   By: Margaretha Sheffield MD   On: 03/08/2020 11:35   DG FEMUR, MIN 2 VIEWS RIGHT  Result Date:  03/08/2020 CLINICAL DATA:  Retrograde IM nail of right femur. EXAM: DG C-ARM 1-60 MIN; RIGHT FEMUR 2 VIEWS FLUOROSCOPY TIME:  Fluoroscopy Time:  2 minutes and 12 seconds. Port radiation: 17.36 mGy. COMPARISON:  Radiographs 03/07/2020 FINDINGS: Eight C-arm fluoroscopic images were obtained intraoperatively and submitted for post operative interpretation. These images demonstrate surgical changes of intramedullary nail and screw fixation of a distal femur fracture with improved alignment. Multiple mildly displaced butterfly fragments are noted. No unexpected findings. Please see the performing provider's procedural report for further detail. IMPRESSION: Intraoperative fluoroscopic imaging, as detailed above. Electronically Signed   By: Margaretha Sheffield MD   On: 03/08/2020 11:35   DG Femur Min 2 Views Right  Result Date: 03/07/2020 CLINICAL DATA:  Fall with leg rotation EXAM: RIGHT FEMUR 2 VIEWS COMPARISON:  None. FINDINGS: Acute comminuted fracture involving the distal shaft of the femur with 1 shaft diameter anterior displacement of distal fracture fragment. 5 cm of overriding. IMPRESSION: Acute comminuted displaced and overriding distal femur fracture. Electronically Signed   By: Donavan Foil M.D.   On: 03/07/2020 18:24   DG FEMUR PORT, MIN 2 VIEWS RIGHT  Result Date: 03/08/2020 CLINICAL DATA:  ORIF of the distal femur EXAM: RIGHT FEMUR PORTABLE 2 VIEW COMPARISON:  None. FINDINGS: Comminuted distal femoral diaphysis extending into the metaphysis fracture transfixed with a intramedullary nail and interlocking screws. Alignment is near anatomic. Posteriorly displaced 8 x 35 mm fracture fragment. No other acute fracture or dislocation. IMPRESSION: 1. Interval ORIF of a comminuted distal femoral fracture in near-anatomic alignment. Electronically Signed   By: Kathreen Devoid   On: 03/08/2020 15:22    Microbiology: Recent Results (from the past 240 hour(s))  Resp Panel by RT-PCR (Flu A&B, Covid)  Nasopharyngeal Swab     Status: None   Collection Time: 03/07/20 11:27 PM   Specimen: Nasopharyngeal Swab; Nasopharyngeal(NP) swabs in vial transport medium  Result Value Ref Range Status   SARS Coronavirus 2 by RT PCR NEGATIVE NEGATIVE Final    Comment: (NOTE) SARS-CoV-2 target nucleic acids are NOT DETECTED.  The SARS-CoV-2 RNA is generally detectable in upper respiratory specimens during the acute phase of infection. The lowest concentration of SARS-CoV-2 viral copies this assay can detect is 138 copies/mL. A negative result does not preclude SARS-Cov-2 infection and should not be used as the sole basis for treatment or other patient management decisions. A negative result may occur with  improper specimen collection/handling, submission of specimen other than nasopharyngeal swab, presence of viral mutation(s) within the areas targeted by this assay, and inadequate number of viral copies(<138 copies/mL). A negative result must be combined with clinical observations, patient history, and epidemiological information. The expected result is Negative.  Fact Sheet for Patients:  EntrepreneurPulse.com.au  Fact Sheet for Healthcare Providers:  IncredibleEmployment.be  This test is no t yet approved or cleared by the [place] FDA and  has been authorized for detection and/or diagnosis of SARS-CoV-2 by FDA under an Emergency Use Authorization (EUA). This EUA will remain  in effect (meaning this test can be used) for the duration of the COVID-19 declaration under Section 564(b)(1) of the Act, 21 U.S.C.section 360bbb-3(b)(1), unless the authorization is terminated  or revoked sooner.       Influenza A by PCR NEGATIVE NEGATIVE Final   Influenza B by PCR NEGATIVE NEGATIVE Final    Comment: (NOTE) The Xpert Xpress SARS-CoV-2/FLU/RSV plus assay is intended as an aid in the diagnosis of influenza from Nasopharyngeal swab specimens and should not be  used as a sole basis for treatment. Nasal washings and aspirates are unacceptable for Xpert Xpress SARS-CoV-2/FLU/RSV testing.  Fact Sheet for Patients: EntrepreneurPulse.com.au  Fact Sheet for Healthcare Providers: IncredibleEmployment.be  This test is not yet approved or cleared by the [place] FDA and has been authorized for detection and/or diagnosis of SARS-CoV-2 by FDA under an Emergency Use Authorization (EUA). This EUA will remain in effect (meaning this test can be used) for the duration of the COVID-19 declaration under Section 564(b)(1) of the Act, 21 U.S.C. section 360bbb-3(b)(1), unless the authorization is terminated or revoked.  Performed at [facility], [place] [address], [place], [place] [address]   Surgical pcr screen     Status: None   Collection Time: 03/08/20  3:28 AM   Specimen: Nasal Mucosa; Nasal Swab  Result Value Ref Range Status   MRSA, PCR NEGATIVE NEGATIVE Final   Staphylococcus aureus NEGATIVE NEGATIVE Final    Comment: (NOTE) The Xpert SA Assay (FDA approved for NASAL specimens in patients 34 years of age and older), is one component of a comprehensive surveillance program. It is not intended to diagnose infection nor to guide or monitor treatment. Performed at [facility], [place] [address], [place], [place] [address]   SARS CORONAVIRUS 2 (TAT 6-24 HRS) Nasopharyngeal Nasopharyngeal Swab     Status: None   Collection Time: 03/15/20 12:42 PM   Specimen: Nasopharyngeal Swab  Result Value Ref Range Status   SARS Coronavirus 2 NEGATIVE NEGATIVE Final    Comment: (NOTE) SARS-CoV-2 target nucleic acids are NOT DETECTED.  The SARS-CoV-2 RNA is generally detectable in upper and lower respiratory specimens during the acute phase of infection. Negative results do not preclude SARS-CoV-2 infection, do  not rule out co-infections with other pathogens, and should not be used as the sole basis for  treatment or other patient management decisions. Negative results must be combined with clinical observations, patient history, and epidemiological information. The expected result is Negative.  Fact Sheet for Patients: SugarRoll.be  Fact Sheet for Healthcare Providers: https://www.woods-mathews.com/  This test is not yet approved or cleared by the [place] FDA and  has been authorized for detection and/or diagnosis of SARS-CoV-2 by FDA under an Emergency Use Authorization (EUA). This EUA will remain  in effect (meaning this test can be used) for the duration of the COVID-19 declaration under Se ction 564(b)(1) of the Act, 21 U.S.C. section 360bbb-3(b)(1), unless the authorization is terminated or revoked sooner.  Performed at [facility], [place] [address], [place], [place] [address]      Labs: Basic Metabolic Panel: Recent Labs  Lab 03/11/20 0255 03/14/20 0331 03/15/20 0427 03/16/20 0113  NA 130* 131*  --  136  K 3.7 4.4  --  4.3  CL 95* 95*  --  102  CO2 24 25  --  23  GLUCOSE 129* 125*  --  110*  BUN 22 31*  --  23  CREATININE 1.27* 1.25* 1.20 1.09  CALCIUM 8.2* 8.2*  --  8.1*  MG 2.1  --   --   --    Liver Function Tests: No results for input(s): AST, ALT, ALKPHOS, BILITOT, PROT, ALBUMIN in the last 168 hours. No results for input(s): LIPASE, AMYLASE in the last 168 hours. No results for input(s): AMMONIA in the last 168 hours. CBC: Recent Labs  Lab 03/11/20 0255 03/14/20 0331 03/16/20 0113 03/16/20 1227  WBC 8.3 7.3 8.5  --   NEUTROABS  --  4.8 6.1  --   HGB 7.3* 7.0* 6.1* 7.2*  HCT 22.1* 21.4* 19.3* 22.3*  MCV 87.7 89.2 91.5  --   PLT 158 228 276  --    Cardiac Enzymes: No results for input(s): CKTOTAL, CKMB, CKMBINDEX, TROPONINI in the last 168 hours. BNP: BNP (last 3 results) No results for input(s): BNP in the last 8760 hours.  ProBNP (last 3 results) No results for input(s): PROBNP in the  last 8760 hours.  CBG: No results for input(s): GLUCAP in the last 168 hours.     Signed:  Cristal Deer, MD [facility] 03/17/2020, 1:45 PM

## 2020-03-17 NOTE — Plan of Care (Signed)
Patient's hgb is now 7.2 after 1 unit of blood transfused yesterday am. Foley catheter was also removed at 1300. Pain controlled with prn medication as ordered. Plan for discharge is [facility] as long as he is medically stable. Will continue to monitor and continue current POC.

## 2020-03-17 NOTE — Progress Notes (Signed)
Report given to Ramato,Staff nurse at [facility]. All questions and concerns were fully answered.

## 2020-08-25 ENCOUNTER — Encounter: Payer: Self-pay | Admitting: Cardiovascular Disease

## 2020-08-25 ENCOUNTER — Ambulatory Visit: Payer: Medicare Other | Admitting: Cardiovascular Disease

## 2020-08-25 ENCOUNTER — Other Ambulatory Visit: Payer: Self-pay

## 2020-08-25 VITALS — BP 83/46 | HR 111 | Ht 73.0 in | Wt 221.0 lb

## 2020-08-25 DIAGNOSIS — I519 Heart disease, unspecified: Secondary | ICD-10-CM | POA: Diagnosis not present

## 2020-08-25 DIAGNOSIS — I4819 Other persistent atrial fibrillation: Secondary | ICD-10-CM | POA: Diagnosis not present

## 2020-08-25 DIAGNOSIS — Z01812 Encounter for preprocedural laboratory examination: Secondary | ICD-10-CM

## 2020-08-25 DIAGNOSIS — Z01818 Encounter for other preprocedural examination: Secondary | ICD-10-CM

## 2020-08-25 NOTE — Assessment & Plan Note (Signed)
Dustin [name] was referred for newly recognized A. fib.  His last EKG in our system 03/08/2020 revealed sinus rhythm.  He was seen by his primary care physician Dr. Dorthy Cooler on 06/23/2020 and begun on Eliquis 5 mg p.o. twice daily.  He is unaware that he is in A. fib.  His heart rate today is 111 with a blood pressure of 83/46.  I do not feel comfortable rate controlling him with his blood pressure.  I am going to repeat a 2D echo and arrange for him to undergo outpatient DC cardioversion.

## 2020-08-25 NOTE — H&P (View-Only) (Signed)
08/25/2020 Dustin [name]   01/04/1941  [id number]  Primary Physician Dustin Amel, MD Primary Cardiologist: Dustin Harp MD Dustin [name], [name]  HPI:  Dustin [name] is a 80 y.o. mildly overweight married Caucasian male father of 11, grandfather of 2 grandchildren referred by Dustin [name], his PCP, for newly recognized A. fib.  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  He has no cardiac risk factors.  There is no family history.  He is never had a heart attack or stroke.  He denies chest pain or shortness of breath.  He is currently the caregiver for his wife.  Does have peripheral neuropathy.  He was found to be in A. fib with RVR by his PCP and begun on Eliquis oral anticoagulation 2 months ago.   Current Meds  Medication Sig   apixaban (ELIQUIS) 5 MG TABS tablet Take 5 mg by mouth 2 (two) times daily.   docusate sodium (COLACE) 100 MG capsule Take 1 capsule (100 mg total) by mouth 2 (two) times daily.   metoprolol tartrate (LOPRESSOR) 25 MG tablet 1 tablet with food   Multiple Vitamin (MULTIVITAMIN WITH MINERALS) TABS tablet Take 1 tablet by mouth daily.   pantoprazole (PROTONIX) 40 MG tablet Take 1 tablet (40 mg total) by mouth daily.   QUEtiapine (SEROQUEL) 400 MG tablet Take 1 tablet (400 mg total) by mouth at bedtime.   silodosin (RAPAFLO) 8 MG CAPS capsule Take 8 mg by mouth daily.     Allergies  Allergen Reactions   Desmopressin Acetate    Zoloft [Sertraline]     Felt bad    Social History   Socioeconomic History   Marital status: Married    Spouse name: Dustin [name]   Number of children: 2   Years of education: college   Highest education level: Not on file  Occupational History   Occupation: retired  Tobacco Use   Smoking status: Former    Packs/day: 1.50    Pack years: 0.00    Types: Cigarettes    Quit date: 01/27/1990    Years since quitting: 30.5   Smokeless tobacco: Never  Vaping Use   Vaping Use: Never used   Substance and Sexual Activity   Alcohol use: No   Drug use: No   Sexual activity: Not on file  Other Topics Concern   Not on file  Social History Narrative   Pt lives home with wife Dustin [name])   Pt is retired   Veterinary surgeon    Pt is left handed   Pt consumes 2 cups of coffee daily   Social Determinants of Radio broadcast assistant Strain: Not on file  Food Insecurity: Not on file  Transportation Needs: Not on file  Physical Activity: Not on file  Stress: Not on file  Social Connections: Not on file  Intimate Partner Violence: Not on file     Review of Systems: General: negative for chills, fever, night sweats or weight changes.  Cardiovascular: negative for chest pain, dyspnea on exertion, edema, orthopnea, palpitations, paroxysmal nocturnal dyspnea or shortness of breath Dermatological: negative for rash Respiratory: negative for cough or wheezing Urologic: negative for hematuria Abdominal: negative for nausea, vomiting, diarrhea, bright red blood per rectum, melena, or hematemesis Neurologic: negative for visual changes, syncope, or dizziness All other systems reviewed and are otherwise negative except as noted above.    Blood pressure (!) 83/46, pulse (!) 111, height 6\' 1"  (1.854 m),  weight 221 lb (100.2 kg), SpO2 97 %.  General appearance: alert and no distress Neck: no adenopathy, no carotid bruit, no JVD, supple, symmetrical, trachea midline, and thyroid not enlarged, symmetric, no tenderness/mass/nodules Lungs: clear to auscultation bilaterally Heart: irregularly irregular rhythm Extremities: extremities normal, atraumatic, no cyanosis or edema Pulses: 2+ and symmetric Skin: Skin color, texture, turgor normal. No rashes or lesions Neurologic: Grossly normal  EKG atrial fibrillation with a ventricular sponsor 114 and nonspecific ST and T wave changes.  I personally reviewed this EKG.  ASSESSMENT AND PLAN:   Left ventricular dysfunction History of mild  left ventricular dysfunction by 2D echo last performed 01/24/2016 for dizziness.  His EF was in the 40 to 45% range with mild MR.  I am going to repeat his 2D echocardiogram.  Persistent atrial fibrillation [facility]) Mr. Dustin [name] was referred for newly recognized A. fib.  His last EKG in our system 03/08/2020 revealed sinus rhythm.  He was seen by his primary care physician Dr. Dorthy [name] on 06/23/2020 and begun on Eliquis 5 mg p.o. twice daily.  He is unaware that he is in A. fib.  His heart rate today is 111 with a blood pressure of 83/46.  I do not feel comfortable rate controlling him with his blood pressure.  I am going to repeat a 2D echo and arrange for him to undergo outpatient DC cardioversion.     Dustin Harp MD FACP,FACC,FAHA, [facility] 08/25/2020 11:33 AM

## 2020-08-25 NOTE — Patient Instructions (Signed)
Medication Instructions:  No Changes In Medications at this time.  *If you need a refill on your cardiac medications before your next appointment, please call your pharmacy*  Lab Work: [facility] AND [place] THIS ON July 27th- NO APPOINTMENT NEEDED  If you have labs (blood work) drawn today and your tests are completely normal, you will receive your results only by: [facility] (if you have MyChart) OR A paper copy in the mail If you have any lab test that is abnormal or we need to change your treatment, we will call you to review the results.  Testing/Procedures: Your physician has requested that you have an echocardiogram THIS NEEDS TO BE DONE PRIOR TO CARDIOVERSION. Echocardiography is a painless test that uses sound waves to create images of your heart. It provides your doctor with information about the size and shape of your heart and how well your heart's chambers and valves are working. You may receive an ultrasound enhancing agent through an IV if needed to better visualize your heart during the echo.This procedure takes approximately one hour. There are no restrictions for this procedure. This will take place at the [address].   Dear Dustin [name] (patient name) You are scheduled for a Cardioversion on Wednesday August 3rd  with Dr. Johney Frame.  Please arrive at the [facility] (Main Entrance A) at [facility]: [address] [place], [place] [address] at 8 am. (1 hour prior to procedure unless lab work is needed; if lab work is needed arrive 1.5 hours ahead)  DIET: Nothing to eat or drink after midnight except a sip of water with medications (see medication instructions below)  FYI: For your safety, and to allow [place] to monitor your vital signs accurately during the surgery/procedure we request that   if you have artificial nails, gel coating, SNS etc. Please have those removed prior to your surgery/procedure. Not having the nail coverings /polish  removed may result in cancellation or delay of your surgery/procedure.  Medication Instructions:  Continue your anticoagulant: Eliquis You will need to continue your anticoagulant after your procedure until you  are told by your  Provider that it is safe to stop  Labs: ONE WEEK PRIOR TO CARDIOVERSION- PLEASE HAVE THIS DONE ON July 27th  Come to: [address] [place]- [place]. [place] must have a responsible person to drive you home and stay in the waiting area during your procedure. Failure to do so could result in cancellation.  Bring your insurance cards.  *Special Note: Every effort is made to have your procedure done on time. Occasionally there are emergencies that occur at the hospital that may cause delays. Please be patient if a delay does occur.    Follow-Up: At [facility], you and your health needs are our priority.  As part of our continuing mission to provide you with exceptional heart care, we have created designated Provider Care Teams.  These Care Teams include your primary Cardiologist (physician) and Advanced Practice Providers (APPs -  Physician Assistants and Nurse Practitioners) who all work together to provide you with the care you need, when you need it.  We recommend signing up for the patient portal called "MyChart".  Sign up information is provided on this After Visit Summary.  MyChart is used to connect with patients for Virtual Visits (Telemedicine).  Patients are able to view lab/test results, encounter notes, upcoming appointments, etc.  Non-urgent messages can be sent to your  provider as well.   To learn more about what you can do with MyChart, go to [email or web address].    Your next appointment:   AFTER CARDIOVERSION   The format for your next appointment:   In Person  Provider:   Quay Burow, MD

## 2020-08-25 NOTE — Progress Notes (Signed)
08/25/2020 Dustin [name]   04/23/1940  [id number]  Primary Physician Lujean Amel, MD Primary Cardiologist: Lorretta Harp MD [name], [name]  HPI:  Dustin [name] is a 80 y.o. mildly overweight married Caucasian male father of 41, grandfather of 2 grandchildren referred by Dr.Koirala, his PCP, for newly recognized A. fib.  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  He has no cardiac risk factors.  There is no family history.  He is never had a heart attack or stroke.  He denies chest pain or shortness of breath.  He is currently the caregiver for his wife.  Does have peripheral neuropathy.  He was found to be in A. fib with RVR by his PCP and begun on Eliquis oral anticoagulation 2 months ago.   Current Meds  Medication Sig   apixaban (ELIQUIS) 5 MG TABS tablet Take 5 mg by mouth 2 (two) times daily.   docusate sodium (COLACE) 100 MG capsule Take 1 capsule (100 mg total) by mouth 2 (two) times daily.   metoprolol tartrate (LOPRESSOR) 25 MG tablet 1 tablet with food   Multiple Vitamin (MULTIVITAMIN WITH MINERALS) TABS tablet Take 1 tablet by mouth daily.   pantoprazole (PROTONIX) 40 MG tablet Take 1 tablet (40 mg total) by mouth daily.   QUEtiapine (SEROQUEL) 400 MG tablet Take 1 tablet (400 mg total) by mouth at bedtime.   silodosin (RAPAFLO) 8 MG CAPS capsule Take 8 mg by mouth daily.     Allergies  Allergen Reactions   Desmopressin Acetate    Zoloft [Sertraline]     Felt bad    Social History   Socioeconomic History   Marital status: Married    Spouse name: [name]   Number of children: 2   Years of education: college   Highest education level: Not on file  Occupational History   Occupation: retired  Tobacco Use   Smoking status: Former    Packs/day: 1.50    Pack years: 0.00    Types: Cigarettes    Quit date: 01/27/1990    Years since quitting: 30.5   Smokeless tobacco: Never  Vaping Use   Vaping Use: Never used   Substance and Sexual Activity   Alcohol use: No   Drug use: No   Sexual activity: Not on file  Other Topics Concern   Not on file  Social History Narrative   [name]   Pt is retired   Veterinary surgeon    Pt is left handed   Pt consumes 2 cups of coffee daily   Social Determinants of Radio broadcast assistant Strain: Not on file  Food Insecurity: Not on file  Transportation Needs: Not on file  Physical Activity: Not on file  Stress: Not on file  Social Connections: Not on file  Intimate Partner Violence: Not on file     Review of Systems: General: negative for chills, fever, night sweats or weight changes.  Cardiovascular: negative for chest pain, dyspnea on exertion, edema, orthopnea, palpitations, paroxysmal nocturnal dyspnea or shortness of breath Dermatological: negative for rash Respiratory: negative for cough or wheezing Urologic: negative for hematuria Abdominal: negative for nausea, vomiting, diarrhea, bright red blood per rectum, melena, or hematemesis Neurologic: negative for visual changes, syncope, or dizziness All other systems reviewed and are otherwise negative except as noted above.    Blood pressure (!) 83/46, pulse (!) 111, height 6\' 1"  (1.854 m),  weight 221 lb (100.2 kg), SpO2 97 %.  General appearance: alert and no distress Neck: no adenopathy, no carotid bruit, no JVD, supple, symmetrical, trachea midline, and thyroid not enlarged, symmetric, no tenderness/mass/nodules Lungs: clear to auscultation bilaterally Heart: irregularly irregular rhythm Extremities: extremities normal, atraumatic, no cyanosis or edema Pulses: 2+ and symmetric Skin: Skin color, texture, turgor normal. No rashes or lesions Neurologic: Grossly normal  EKG atrial fibrillation with a ventricular sponsor 114 and nonspecific ST and T wave changes.  I personally reviewed this EKG.  ASSESSMENT AND PLAN:   Left ventricular dysfunction History of mild  left ventricular dysfunction by 2D echo last performed 01/24/2016 for dizziness.  His EF was in the 40 to 45% range with mild MR.  I am going to repeat his 2D echocardiogram.  Persistent atrial fibrillation [facility]) [name] was referred for newly recognized A. fib.  His last EKG in our system 03/08/2020 revealed sinus rhythm.  He was seen by his primary care physician Dr. Dorthy Cooler on 06/23/2020 and begun on Eliquis 5 mg p.o. twice daily.  He is unaware that he is in A. fib.  His heart rate today is 111 with a blood pressure of 83/46.  I do not feel comfortable rate controlling him with his blood pressure.  I am going to repeat a 2D echo and arrange for him to undergo outpatient DC cardioversion.     Lorretta Harp MD FACP,FACC,FAHA, [facility] 08/25/2020 11:33 AM

## 2020-08-25 NOTE — Assessment & Plan Note (Signed)
History of mild left ventricular dysfunction by 2D echo last performed 01/24/2016 for dizziness.  His EF was in the 40 to 45% range with mild MR.  I am going to repeat his 2D echocardiogram.

## 2020-09-14 ENCOUNTER — Ambulatory Visit (HOSPITAL_COMMUNITY): Payer: Medicare Other | Attending: Cardiovascular Disease

## 2020-09-14 ENCOUNTER — Other Ambulatory Visit: Payer: Self-pay

## 2020-09-14 ENCOUNTER — Telehealth: Payer: Self-pay | Admitting: Cardiology

## 2020-09-14 DIAGNOSIS — I4819 Other persistent atrial fibrillation: Secondary | ICD-10-CM | POA: Diagnosis not present

## 2020-09-14 LAB — ECHOCARDIOGRAM COMPLETE
Area-P 1/2: 3.36 cm2
MV M vel: 4.5 m/s
MV Peak grad: 81 mmHg
Radius: 0.6 cm
S' Lateral: 4.7 cm

## 2020-09-14 NOTE — Telephone Encounter (Signed)
Patient states he was not made aware that he has a procedure scheduled for 09/20/20 with Dr. Johney Frame. Please return call to discuss.

## 2020-09-14 NOTE — Telephone Encounter (Signed)
Attempted to call pt phone just rings no answer and no voicemail Will try later .Adonis Housekeeper

## 2020-09-18 LAB — CBC
Hematocrit: 31.6 % — ABNORMAL LOW (ref 37.5–51.0)
Hemoglobin: 10.7 g/dL — ABNORMAL LOW (ref 13.0–17.7)
MCH: 28.7 pg (ref 26.6–33.0)
MCHC: 33.9 g/dL (ref 31.5–35.7)
MCV: 85 fL (ref 79–97)
Platelets: 174 10*3/uL (ref 150–450)
RBC: 3.73 x10E6/uL — ABNORMAL LOW (ref 4.14–5.80)
RDW: 11.8 % (ref 11.6–15.4)
WBC: 5.7 10*3/uL (ref 3.4–10.8)

## 2020-09-18 LAB — BASIC METABOLIC PANEL
BUN/Creatinine Ratio: 11 (ref 10–24)
BUN: 15 mg/dL (ref 8–27)
CO2: 24 mmol/L (ref 20–29)
Calcium: 9.2 mg/dL (ref 8.6–10.2)
Chloride: 104 mmol/L (ref 96–106)
Creatinine, Ser: 1.41 mg/dL — ABNORMAL HIGH (ref 0.76–1.27)
Glucose: 100 mg/dL — ABNORMAL HIGH (ref 65–99)
Potassium: 4.7 mmol/L (ref 3.5–5.2)
Sodium: 143 mmol/L (ref 134–144)
eGFR: 51 mL/min/{1.73_m2} — ABNORMAL LOW (ref >59)

## 2020-09-18 NOTE — Telephone Encounter (Signed)
Spoke with pt and did not know what time procedure was for and did not get pre procedure labs done as of yet Pt aware to get labs done this am and per pt did not get paperwork at last visit with Dr Gwenlyn Found Pt will ask for instruction sheet at office after getting labs done and pt is to be there at 8:00 am on August 3 ./cy

## 2020-09-19 ENCOUNTER — Encounter: Payer: Self-pay | Admitting: *Deleted

## 2020-09-19 ENCOUNTER — Other Ambulatory Visit: Payer: Self-pay | Admitting: *Deleted

## 2020-09-20 ENCOUNTER — Ambulatory Visit (HOSPITAL_COMMUNITY): Payer: Medicare Other | Admitting: Certified Registered"

## 2020-09-20 ENCOUNTER — Ambulatory Visit (HOSPITAL_COMMUNITY)
Admission: RE | Admit: 2020-09-20 | Discharge: 2020-09-20 | Disposition: A | Discharge status: Home / Self Care | Payer: Medicare Other | Source: Ambulatory Visit | Attending: Cardiology | Admitting: Cardiology

## 2020-09-20 ENCOUNTER — Other Ambulatory Visit: Payer: Self-pay

## 2020-09-20 ENCOUNTER — Encounter (HOSPITAL_COMMUNITY): Payer: Self-pay | Admitting: Cardiology

## 2020-09-20 ENCOUNTER — Encounter (HOSPITAL_COMMUNITY)
Admission: RE | Disposition: A | Discharge status: Home / Self Care | Payer: Self-pay | Source: Ambulatory Visit | Attending: Cardiology

## 2020-09-20 DIAGNOSIS — I4819 Other persistent atrial fibrillation: Secondary | ICD-10-CM | POA: Diagnosis not present

## 2020-09-20 DIAGNOSIS — Z79899 Other long term (current) drug therapy: Secondary | ICD-10-CM | POA: Insufficient documentation

## 2020-09-20 DIAGNOSIS — Z87891 Personal history of nicotine dependence: Secondary | ICD-10-CM | POA: Insufficient documentation

## 2020-09-20 DIAGNOSIS — Z6827 Body mass index (BMI) 27.0-27.9, adult: Secondary | ICD-10-CM | POA: Insufficient documentation

## 2020-09-20 DIAGNOSIS — Z66 Do not resuscitate: Secondary | ICD-10-CM | POA: Diagnosis not present

## 2020-09-20 DIAGNOSIS — Z888 Allergy status to other drugs, medicaments and biological substances status: Secondary | ICD-10-CM | POA: Diagnosis not present

## 2020-09-20 DIAGNOSIS — Z7901 Long term (current) use of anticoagulants: Secondary | ICD-10-CM | POA: Diagnosis not present

## 2020-09-20 DIAGNOSIS — E663 Overweight: Secondary | ICD-10-CM | POA: Insufficient documentation

## 2020-09-20 DIAGNOSIS — I519 Heart disease, unspecified: Secondary | ICD-10-CM | POA: Insufficient documentation

## 2020-09-20 HISTORY — PX: CARDIOVERSION: SHX1299

## 2020-09-20 SURGERY — CARDIOVERSION
Anesthesia: General

## 2020-09-20 MED ORDER — PROPOFOL 10 MG/ML IV BOLUS
INTRAVENOUS | Status: DC | PRN
  Administered 2020-09-20: 100 mg via INTRAVENOUS

## 2020-09-20 MED ORDER — LIDOCAINE 2% (20 MG/ML) 5 ML SYRINGE
INTRAMUSCULAR | Status: DC | PRN
  Administered 2020-09-20: 100 mg via INTRAVENOUS

## 2020-09-20 MED ORDER — SODIUM CHLORIDE 0.9 % IV SOLN
INTRAVENOUS | Status: DC | PRN
Start: 2020-09-20 — End: 2020-09-20

## 2020-09-20 NOTE — Transfer of Care (Signed)
Immediate Anesthesia Transfer of Care Note  Patient: Dustin [name]  Procedure(s) Performed: CARDIOVERSION  Patient Location: Endoscopy Unit  Anesthesia Type:General  Level of Consciousness: drowsy and patient cooperative  Airway & Oxygen Therapy: Patient Spontanous Breathing and Patient connected to nasal cannula oxygen  Post-op Assessment: Report given to RN and Post -op Vital signs reviewed and stable  Post vital signs: Reviewed and stable  Last Vitals:  Vitals Value Taken Time  BP    Temp    Pulse 65 09/20/20 0902  Resp 14 09/20/20 0902  SpO2 92 % 09/20/20 0902  Vitals shown include unvalidated device data.  Last Pain:  Vitals:   09/20/20 0808  PainSc: 0-No pain         Complications: No notable events documented.

## 2020-09-20 NOTE — Interval H&P Note (Signed)
History and Physical Interval Note:  09/20/2020 8:47 AM  Dustin [name]  has presented today for surgery, with the diagnosis of AFIB.  The various methods of treatment have been discussed with the patient and family. After consideration of risks, benefits and other options for treatment, the patient has consented to  Procedure(s): CARDIOVERSION (N/A) as a surgical intervention.  The patient's history has been reviewed, patient examined, no change in status, stable for surgery.  I have reviewed the patient's chart and labs.  Questions were answered to the patient's satisfaction.     [name]

## 2020-09-20 NOTE — Anesthesia Postprocedure Evaluation (Signed)
Anesthesia Post Note  Patient: Dustin [name]  Procedure(s) Performed: CARDIOVERSION     Patient location during evaluation: PACU Anesthesia Type: General Level of consciousness: awake and alert Pain management: pain level controlled Vital Signs Assessment: post-procedure vital signs reviewed and stable Respiratory status: spontaneous breathing, nonlabored ventilation and respiratory function stable Cardiovascular status: blood pressure returned to baseline and stable Postop Assessment: no apparent nausea or vomiting Anesthetic complications: no   No notable events documented.  Last Vitals:  Vitals:   09/20/20 0908 09/20/20 0918  BP:    Temp: 36.4 C   SpO2: 92% 90%    Last Pain:  Vitals:   09/20/20 0918  TempSrc:   PainSc: 0-No pain                 Lynda Rainwater

## 2020-09-20 NOTE — Discharge Instructions (Signed)
Do not take your metoprolol today (09/20/20).  Check your blood pressure tomorrow morning and can restart your metoprolol if top number above 100

## 2020-09-20 NOTE — Anesthesia Preprocedure Evaluation (Signed)
Anesthesia Evaluation  Patient identified by MRN, date of birth, ID band Patient awake    Reviewed: Allergy & Precautions, NPO status , Patient's Chart, lab work & pertinent test results  Airway Mallampati: IV  TM Distance: >3 FB Neck ROM: Limited    Dental  (+) Dental Advisory Given, Missing, Chipped   Pulmonary former smoker,    Pulmonary exam normal breath sounds clear to auscultation       Cardiovascular +CHF  Normal cardiovascular exam+ dysrhythmias Atrial Fibrillation  Rhythm:Regular Rate:Normal     Neuro/Psych  Headaches, PSYCHIATRIC DISORDERS Depression  Neuromuscular disease    GI/Hepatic Neg liver ROS, GERD  Medicated,  Endo/Other  negative endocrine ROS  Renal/GU negative Renal ROS     Musculoskeletal right distal femur fracture   Abdominal   Peds  Hematology  (+) Blood dyscrasia, anemia ,   Anesthesia Other Findings Day of surgery medications reviewed with the patient.  Reproductive/Obstetrics                             Anesthesia Physical  Anesthesia Plan  ASA: III  Anesthesia Plan: General   Post-op Pain Management:    Induction: Intravenous  PONV Risk Score and Plan: 2 and Ondansetron, Midazolam and Treatment may vary due to age or medical condition  Airway Management Planned: Mask  Additional Equipment:   Intra-op Plan:   Post-operative Plan:   Informed Consent: I have reviewed the patients History and Physical, chart, labs and discussed the procedure including the risks, benefits and alternatives for the proposed anesthesia with the patient or authorized representative who has indicated his/her understanding and acceptance.   Patient has DNR.  Discussed DNR with patient and Suspend DNR.   Dental advisory given  Plan Discussed with: CRNA  Anesthesia Plan Comments:         Anesthesia Quick Evaluation

## 2020-09-20 NOTE — CV Procedure (Addendum)
Procedure:   DCCV  Indication:  Symptomatic atrial fibrillation  Procedure Note:  The patient signed informed consent.  They have had had therapeutic anticoagulation with Eliquis greater than 3 weeks.  Anesthesia was administered by Dr. Sabra Heck and Claybon Jabs, CRNA.  Adequate airway was maintained throughout and vital followed per protocol.  They were cardioverted x 1 with 200J of biphasic synchronized energy.  They converted to NSR with rate in 60s.  BP 96/49 post cardioversion, despite not taking metoprolol this morning. Recommend holding metoprolol today, can restart tomorrow if SBP>100.  There were no apparent complications.  The patient had normal neuro status and respiratory status post procedure with vitals stable as recorded elsewhere.    Follow up:  They will continue on current medical therapy and follow up with cardiology as scheduled.  Oswaldo Milian, MD 09/20/2020 9:00 AM

## 2020-09-21 ENCOUNTER — Encounter (HOSPITAL_COMMUNITY): Payer: Self-pay | Admitting: Cardiology

## 2020-09-22 ENCOUNTER — Encounter: Payer: Self-pay | Admitting: *Deleted

## 2020-10-11 ENCOUNTER — Encounter: Payer: Self-pay | Admitting: Cardiovascular Disease

## 2020-10-11 ENCOUNTER — Other Ambulatory Visit: Payer: Self-pay

## 2020-10-11 ENCOUNTER — Ambulatory Visit: Payer: Medicare Other | Admitting: Cardiovascular Disease

## 2020-10-11 VITALS — BP 118/74 | HR 86 | Ht 74.0 in | Wt 220.0 lb

## 2020-10-11 DIAGNOSIS — I4819 Other persistent atrial fibrillation: Secondary | ICD-10-CM

## 2020-10-11 NOTE — Patient Instructions (Signed)

## 2020-10-11 NOTE — Progress Notes (Signed)
Dustin [name] returns today for follow-up of his recent successful outpatient DC cardioversion performed 09/20/2020 by Dr. Gardiner Rhyme.  He is on Eliquis oral anticoagulation.  He is currently back in A. fib but asymptomatic.  His 2D echo was normal except for moderate left atrial enlargement.  At this point, we will settle for rate rather than rhythm control.  I had a long discussion with the patient about this.  I will see him back in 1 year for follow-up.  His EKG today reveals A. fib with ventricular sponsor of 86.  Dustin [name], M.D., [place], [facility], Laverta [place] [address] [place], [place]  [address]  [id number] 10/11/2020 9:09 AM

## 2021-10-30 ENCOUNTER — Emergency Department (HOSPITAL_COMMUNITY): Payer: Medicare Other

## 2021-10-30 ENCOUNTER — Inpatient Hospital Stay (HOSPITAL_COMMUNITY)
Admission: EM | Admit: 2021-10-30 | Discharge: 2021-11-02 | DRG: 177 | Disposition: A | Discharge status: Home Health | Payer: Medicare Other | Attending: Internal Medicine | Admitting: Internal Medicine

## 2021-10-30 ENCOUNTER — Other Ambulatory Visit: Payer: Self-pay

## 2021-10-30 DIAGNOSIS — N390 Urinary tract infection, site not specified: Secondary | ICD-10-CM

## 2021-10-30 DIAGNOSIS — I5042 Chronic combined systolic (congestive) and diastolic (congestive) heart failure: Secondary | ICD-10-CM | POA: Diagnosis present

## 2021-10-30 DIAGNOSIS — Z87891 Personal history of nicotine dependence: Secondary | ICD-10-CM

## 2021-10-30 DIAGNOSIS — Z7984 Long term (current) use of oral hypoglycemic drugs: Secondary | ICD-10-CM

## 2021-10-30 DIAGNOSIS — Z7901 Long term (current) use of anticoagulants: Secondary | ICD-10-CM

## 2021-10-30 DIAGNOSIS — J9601 Acute respiratory failure with hypoxia: Secondary | ICD-10-CM | POA: Diagnosis present

## 2021-10-30 DIAGNOSIS — N401 Enlarged prostate with lower urinary tract symptoms: Secondary | ICD-10-CM | POA: Diagnosis not present

## 2021-10-30 DIAGNOSIS — Z981 Arthrodesis status: Secondary | ICD-10-CM

## 2021-10-30 DIAGNOSIS — K219 Gastro-esophageal reflux disease without esophagitis: Secondary | ICD-10-CM | POA: Diagnosis present

## 2021-10-30 DIAGNOSIS — J690 Pneumonitis due to inhalation of food and vomit: Secondary | ICD-10-CM | POA: Diagnosis not present

## 2021-10-30 DIAGNOSIS — F32A Depression, unspecified: Secondary | ICD-10-CM | POA: Diagnosis not present

## 2021-10-30 DIAGNOSIS — R0602 Shortness of breath: Secondary | ICD-10-CM | POA: Diagnosis not present

## 2021-10-30 DIAGNOSIS — G2581 Restless legs syndrome: Secondary | ICD-10-CM | POA: Diagnosis present

## 2021-10-30 DIAGNOSIS — G6289 Other specified polyneuropathies: Secondary | ICD-10-CM | POA: Diagnosis present

## 2021-10-30 DIAGNOSIS — Z66 Do not resuscitate: Secondary | ICD-10-CM | POA: Diagnosis present

## 2021-10-30 DIAGNOSIS — R531 Weakness: Secondary | ICD-10-CM

## 2021-10-30 DIAGNOSIS — Z20822 Contact with and (suspected) exposure to covid-19: Secondary | ICD-10-CM | POA: Diagnosis present

## 2021-10-30 DIAGNOSIS — G629 Polyneuropathy, unspecified: Secondary | ICD-10-CM | POA: Diagnosis present

## 2021-10-30 DIAGNOSIS — R413 Other amnesia: Secondary | ICD-10-CM | POA: Diagnosis present

## 2021-10-30 DIAGNOSIS — J9621 Acute and chronic respiratory failure with hypoxia: Secondary | ICD-10-CM | POA: Diagnosis present

## 2021-10-30 DIAGNOSIS — N400 Enlarged prostate without lower urinary tract symptoms: Secondary | ICD-10-CM | POA: Diagnosis present

## 2021-10-30 DIAGNOSIS — I4819 Other persistent atrial fibrillation: Secondary | ICD-10-CM | POA: Diagnosis present

## 2021-10-30 DIAGNOSIS — R338 Other retention of urine: Secondary | ICD-10-CM | POA: Diagnosis present

## 2021-10-30 DIAGNOSIS — G9341 Metabolic encephalopathy: Secondary | ICD-10-CM | POA: Diagnosis present

## 2021-10-30 DIAGNOSIS — N179 Acute kidney failure, unspecified: Secondary | ICD-10-CM | POA: Diagnosis present

## 2021-10-30 DIAGNOSIS — Z79899 Other long term (current) drug therapy: Secondary | ICD-10-CM

## 2021-10-30 DIAGNOSIS — Z823 Family history of stroke: Secondary | ICD-10-CM

## 2021-10-30 DIAGNOSIS — Z888 Allergy status to other drugs, medicaments and biological substances status: Secondary | ICD-10-CM

## 2021-10-30 DIAGNOSIS — J189 Pneumonia, unspecified organism: Principal | ICD-10-CM

## 2021-10-30 DIAGNOSIS — Z825 Family history of asthma and other chronic lower respiratory diseases: Secondary | ICD-10-CM

## 2021-10-30 DIAGNOSIS — I959 Hypotension, unspecified: Secondary | ICD-10-CM | POA: Diagnosis present

## 2021-10-30 LAB — RESP PANEL BY RT-PCR (FLU A&B, COVID) ARPGX2
Influenza A by PCR: NEGATIVE
Influenza B by PCR: NEGATIVE
SARS Coronavirus 2 by RT PCR: NEGATIVE

## 2021-10-30 LAB — COMPREHENSIVE METABOLIC PANEL
ALT: 12 U/L (ref 0–44)
AST: 27 U/L (ref 15–41)
Albumin: 3.5 g/dL (ref 3.5–5.0)
Alkaline Phosphatase: 80 U/L (ref 38–126)
Anion gap: 8 (ref 5–15)
BUN: 18 mg/dL (ref 8–23)
CO2: 27 mmol/L (ref 22–32)
Calcium: 8.2 mg/dL — ABNORMAL LOW (ref 8.9–10.3)
Chloride: 102 mmol/L (ref 98–111)
Creatinine, Ser: 1.96 mg/dL — ABNORMAL HIGH (ref 0.61–1.24)
GFR, Estimated: 34 mL/min — ABNORMAL LOW (ref >60)
Glucose, Bld: 131 mg/dL — ABNORMAL HIGH (ref 70–99)
Potassium: 4 mmol/L (ref 3.5–5.1)
Sodium: 137 mmol/L (ref 135–145)
Total Bilirubin: 1.1 mg/dL (ref 0.3–1.2)
Total Protein: 6.3 g/dL — ABNORMAL LOW (ref 6.5–8.1)

## 2021-10-30 LAB — I-STAT VENOUS BLOOD GAS, ED
Acid-Base Excess: 3 mmol/L — ABNORMAL HIGH (ref 0.0–2.0)
Bicarbonate: 28.3 mmol/L — ABNORMAL HIGH (ref 20.0–28.0)
Calcium, Ion: 1.04 mmol/L — ABNORMAL LOW (ref 1.15–1.40)
HCT: 35 % — ABNORMAL LOW (ref 39.0–52.0)
Hemoglobin: 11.9 g/dL — ABNORMAL LOW (ref 13.0–17.0)
O2 Saturation: 44 %
Potassium: 4 mmol/L (ref 3.5–5.1)
Sodium: 137 mmol/L (ref 135–145)
TCO2: 30 mmol/L (ref 22–32)
pCO2, Ven: 44.5 mmHg (ref 44–60)
pH, Ven: 7.411 (ref 7.25–7.43)
pO2, Ven: 25 mmHg — CL (ref 32–45)

## 2021-10-30 LAB — CBC WITH DIFFERENTIAL/PLATELET
Abs Immature Granulocytes: 0.05 10*3/uL (ref 0.00–0.07)
Basophils Absolute: 0 10*3/uL (ref 0.0–0.1)
Basophils Relative: 0 %
Eosinophils Absolute: 0 10*3/uL (ref 0.0–0.5)
Eosinophils Relative: 0 %
HCT: 35.9 % — ABNORMAL LOW (ref 39.0–52.0)
Hemoglobin: 11.7 g/dL — ABNORMAL LOW (ref 13.0–17.0)
Immature Granulocytes: 1 %
Lymphocytes Relative: 14 %
Lymphs Abs: 1.3 10*3/uL (ref 0.7–4.0)
MCH: 31.1 pg (ref 26.0–34.0)
MCHC: 32.6 g/dL (ref 30.0–36.0)
MCV: 95.5 fL (ref 80.0–100.0)
Monocytes Absolute: 0.7 10*3/uL (ref 0.1–1.0)
Monocytes Relative: 8 %
Neutro Abs: 7.4 10*3/uL (ref 1.7–7.7)
Neutrophils Relative %: 77 %
Platelets: 128 10*3/uL — ABNORMAL LOW (ref 150–400)
RBC: 3.76 MIL/uL — ABNORMAL LOW (ref 4.22–5.81)
RDW: 13.4 % (ref 11.5–15.5)
WBC: 9.5 10*3/uL (ref 4.0–10.5)
nRBC: 0 % (ref 0.0–0.2)

## 2021-10-30 LAB — I-STAT CHEM 8, ED
BUN: 18 mg/dL (ref 8–23)
Calcium, Ion: 1.04 mmol/L — ABNORMAL LOW (ref 1.15–1.40)
Chloride: 99 mmol/L (ref 98–111)
Creatinine, Ser: 1.9 mg/dL — ABNORMAL HIGH (ref 0.61–1.24)
Glucose, Bld: 124 mg/dL — ABNORMAL HIGH (ref 70–99)
HCT: 35 % — ABNORMAL LOW (ref 39.0–52.0)
Hemoglobin: 11.9 g/dL — ABNORMAL LOW (ref 13.0–17.0)
Potassium: 4 mmol/L (ref 3.5–5.1)
Sodium: 137 mmol/L (ref 135–145)
TCO2: 27 mmol/L (ref 22–32)

## 2021-10-30 LAB — URINALYSIS, ROUTINE W REFLEX MICROSCOPIC
Bilirubin Urine: NEGATIVE
Glucose, UA: >=500 mg/dL — AB
Ketones, ur: NEGATIVE mg/dL
Nitrite: POSITIVE — AB
Protein, ur: NEGATIVE mg/dL
Specific Gravity, Urine: 1.015 (ref 1.005–1.030)
pH: 6 (ref 5.0–8.0)

## 2021-10-30 LAB — TROPONIN I (HIGH SENSITIVITY)
Troponin I (High Sensitivity): 51 ng/L — ABNORMAL HIGH (ref <18)
Troponin I (High Sensitivity): 51 ng/L — ABNORMAL HIGH (ref <18)

## 2021-10-30 LAB — LACTIC ACID, PLASMA: Lactic Acid, Venous: 1.7 mmol/L (ref 0.5–1.9)

## 2021-10-30 LAB — BRAIN NATRIURETIC PEPTIDE: B Natriuretic Peptide: 390.4 pg/mL — ABNORMAL HIGH (ref 0.0–100.0)

## 2021-10-30 MED ORDER — GABAPENTIN 300 MG PO CAPS: 600.0000 mg | ORAL_CAPSULE | Freq: Four times a day (QID) | ORAL | Status: DC | PRN

## 2021-10-30 MED ORDER — VANCOMYCIN HCL IN DEXTROSE 1-5 GM/200ML-% IV SOLN: 1000.0000 mg | INTRAVENOUS | Status: DC

## 2021-10-30 MED ORDER — SODIUM CHLORIDE 0.9 % IV SOLN: INTRAVENOUS | Status: DC

## 2021-10-30 MED ORDER — PANTOPRAZOLE SODIUM 40 MG PO TBEC
40.0000 mg | DELAYED_RELEASE_TABLET | Freq: Every day | ORAL | Status: DC
  Administered 2021-10-31 – 2021-11-02 (×3): 40 mg via ORAL
  Filled 2021-10-30 (×3): qty 1

## 2021-10-30 MED ORDER — SODIUM CHLORIDE 0.9 % IV SOLN
2.0000 g | Freq: Two times a day (BID) | INTRAVENOUS | Status: DC
  Administered 2021-10-30: 2 g via INTRAVENOUS
  Filled 2021-10-30: qty 12.5

## 2021-10-30 MED ORDER — SODIUM CHLORIDE 0.9 % IV BOLUS
1000.0000 mL | Freq: Once | INTRAVENOUS | Status: AC
  Administered 2021-10-30: 1000 mL via INTRAVENOUS

## 2021-10-30 MED ORDER — POLYETHYLENE GLYCOL 3350 17 G PO PACK: 17.0000 g | PACK | Freq: Every day | ORAL | Status: DC | PRN

## 2021-10-30 MED ORDER — TAMSULOSIN HCL 0.4 MG PO CAPS
0.4000 mg | ORAL_CAPSULE | Freq: Every day | ORAL | Status: DC
  Administered 2021-10-31 – 2021-11-02 (×3): 0.4 mg via ORAL
  Filled 2021-10-30 (×3): qty 1

## 2021-10-30 MED ORDER — IOHEXOL 350 MG/ML SOLN
70.0000 mL | Freq: Once | INTRAVENOUS | Status: AC | PRN
  Administered 2021-10-30: 70 mL via INTRAVENOUS

## 2021-10-30 MED ORDER — GABAPENTIN 300 MG PO CAPS: 600.0000 mg | ORAL_CAPSULE | ORAL | Status: DC

## 2021-10-30 MED ORDER — ACETAMINOPHEN 325 MG PO TABS: 650.0000 mg | ORAL_TABLET | Freq: Four times a day (QID) | ORAL | Status: DC | PRN

## 2021-10-30 MED ORDER — VANCOMYCIN HCL 1750 MG/350ML IV SOLN
1750.0000 mg | Freq: Once | INTRAVENOUS | Status: AC
  Administered 2021-10-30: 1750 mg via INTRAVENOUS
  Filled 2021-10-30: qty 350

## 2021-10-30 MED ORDER — QUETIAPINE FUMARATE 300 MG PO TABS
600.0000 mg | ORAL_TABLET | Freq: Every day | ORAL | Status: DC
  Administered 2021-10-30 – 2021-11-01 (×3): 600 mg via ORAL
  Filled 2021-10-30 (×2): qty 2
  Filled 2021-10-30 (×2): qty 6
  Filled 2021-10-30: qty 2

## 2021-10-30 MED ORDER — ROPINIROLE HCL 0.25 MG PO TABS: 0.2500 mg | ORAL_TABLET | ORAL | Status: DC | PRN

## 2021-10-30 MED ORDER — SODIUM CHLORIDE 0.9 % IV SOLN
2.0000 g | INTRAVENOUS | Status: DC
  Administered 2021-10-31 – 2021-11-02 (×3): 2 g via INTRAVENOUS
  Filled 2021-10-30 (×3): qty 20

## 2021-10-30 MED ORDER — METOPROLOL TARTRATE 25 MG PO TABS
25.0000 mg | ORAL_TABLET | Freq: Two times a day (BID) | ORAL | Status: DC
  Administered 2021-10-31 – 2021-11-02 (×5): 25 mg via ORAL
  Filled 2021-10-30 (×5): qty 1

## 2021-10-30 MED ORDER — APIXABAN 5 MG PO TABS
5.0000 mg | ORAL_TABLET | Freq: Two times a day (BID) | ORAL | Status: DC
  Administered 2021-10-30 – 2021-11-02 (×6): 5 mg via ORAL
  Filled 2021-10-30 (×6): qty 1

## 2021-10-30 MED ORDER — ACETAMINOPHEN 650 MG RE SUPP: 650.0000 mg | Freq: Four times a day (QID) | RECTAL | Status: DC | PRN

## 2021-10-30 MED ORDER — OXYCODONE HCL 5 MG PO TABS
15.0000 mg | ORAL_TABLET | Freq: Four times a day (QID) | ORAL | Status: DC | PRN
  Administered 2021-10-31 – 2021-11-02 (×9): 15 mg via ORAL
  Filled 2021-10-30 (×9): qty 3

## 2021-10-30 MED ORDER — GABAPENTIN 400 MG PO CAPS
1200.0000 mg | ORAL_CAPSULE | Freq: Every day | ORAL | Status: DC
  Administered 2021-10-30 – 2021-10-31 (×2): 1200 mg via ORAL
  Filled 2021-10-30: qty 4
  Filled 2021-10-30: qty 3

## 2021-10-30 MED ORDER — DULOXETINE HCL 60 MG PO CPEP
60.0000 mg | ORAL_CAPSULE | Freq: Every day | ORAL | Status: DC
  Administered 2021-10-31 – 2021-11-02 (×3): 60 mg via ORAL
  Filled 2021-10-30 (×3): qty 1

## 2021-10-30 MED ORDER — SODIUM CHLORIDE 0.9% FLUSH
3.0000 mL | Freq: Two times a day (BID) | INTRAVENOUS | Status: DC
  Administered 2021-10-30 – 2021-11-02 (×6): 3 mL via INTRAVENOUS

## 2021-10-30 NOTE — ED Triage Notes (Addendum)
Pt arrived via [facility] from home for stroke like symptoms (ataxia, no focal deficits). LKW per EMS approximately 25 hours ago after sudden onset loss of fine motor skills at the [employer] yesterday, was not evaluated at that time. Pt was found down earlier today, does not remember falling, unknown down time. Refused to be seen again. EMS called as symptoms continued to worsen and EMS was called. At time of arrival, cyanosis noted on lips, room air saturation 74%, increased to low 90% on 4L [place]. Pt was hypotensive as well, 82/palp on scene, 662m LR administered via 18g LAC, last pressure PTA to ED 108/p. GCS 15, A&Ox4.    PTA EMS Vitals  HR 100-130 afib BP 82/p -> 108/p SPO2 74% RA -> 91% 2L [place] Temp 100.5

## 2021-10-30 NOTE — ED Notes (Signed)
Patient transported to MRI 

## 2021-10-30 NOTE — ED Notes (Signed)
Patient transported to CT 

## 2021-10-30 NOTE — ED Notes (Signed)
Pt returned from CT °

## 2021-10-30 NOTE — ED Provider Notes (Signed)
[facility] EMERGENCY DEPARTMENT Provider Note   CSN: [id number] Arrival date & time: 10/30/21  1543     History  Chief Complaint  Patient presents with   Code Sepsis   Fall   Shortness of Breath    Dustin [name] is a 81 y.o. male with history of A-fib on Eliquis, systolic and diastolic heart failure, presenting to the ED with complaint of shortness of breath, dizziness, weakness.  Supplemental history is provided by EMS and patient's family at bedside.  Members report that the patient was very weak yesterday, had some slurred speech while he was out grocery shopping.  They advised coming to the ED but he did not want to.  This morning a housekeeper reportedly found the patient lying on the ground in the home with labored breathing.  EMS was called to the scene reported the patient was hypoxic, in A-fib with RVR, and hypotensive.  He was placed on oxygen given a small fluid bolus and brought into the ED.  The patient is awake on arrival and says he has no complaints.  He denies to me chest pain, headache, blurred vision, numbness or weakness.  He denies to me shortness of breath.    Last echo July 2022 EF 50-55%  HPI     Home Medications Prior to Admission medications   Medication Sig Start Date End Date Taking? Authorizing Provider  apixaban (ELIQUIS) 5 MG TABS tablet Take 5 mg by mouth 2 (two) times daily. 06/23/20  Yes [provider]  clotrimazole-betamethasone (LOTRISONE) cream Apply 1 Application topically 2 (two) times daily. 10/16/21  Yes [provider]  DENTA 5000 PLUS 1.1 % CREA dental cream Place 1 Application onto teeth 2 (two) times daily. 06/18/21  Yes [provider]  DULoxetine (CYMBALTA) 60 MG capsule Take 60 mg by mouth daily. 10/10/21  Yes [provider]  FARXIGA 10 MG TABS tablet Take 10 mg by mouth daily. 10/10/21  Yes [provider]  gabapentin (NEURONTIN) 300 MG capsule Take 600-1,200 mg by mouth See  admin instructions. 600 mg 4 times a day, and 1200 mg at bedtime. 08/24/20  Yes [provider]  metoprolol tartrate (LOPRESSOR) 25 MG tablet Take 25 mg by mouth 2 (two) times daily. 06/23/20  Yes [provider]  Multiple Vitamin (MULTIVITAMIN WITH MINERALS) TABS tablet Take 1 tablet by mouth daily. 03/18/20  Yes Cristal Deer, MD  naloxone [facility]) nasal spray 4 mg/0.1 mL SMARTSIG:Both Nares 10/11/21  Yes [provider]  oxyCODONE (ROXICODONE) 15 MG immediate release tablet Take 15 mg by mouth 4 (four) times daily as needed for pain. 10/25/21  Yes [provider]  pantoprazole (PROTONIX) 40 MG tablet Take 1 tablet (40 mg total) by mouth daily. 03/18/20  Yes Cristal Deer, MD  QUEtiapine (SEROQUEL) 300 MG tablet Take 600 mg by mouth at bedtime. 10/16/21  Yes [provider]  rOPINIRole (REQUIP) 0.25 MG tablet Take 0.25 mg by mouth as needed (restless leg). 10/10/21  Yes [provider]  silodosin (RAPAFLO) 8 MG CAPS capsule Take 8 mg by mouth daily. 05/28/20  Yes [provider]  Vitamin D, Ergocalciferol, (DRISDOL) 1.25 MG (50000 UNIT) CAPS capsule Take 50,000 Units by mouth once a week. Thursdays 09/09/21  Yes [provider]      Allergies    Desmopressin acetate and Zoloft [sertraline]    Review of Systems   Review of Systems  Physical Exam Updated Vital Signs BP 115/77   Pulse  84   Temp 98.8 F (37.1 C) (Oral)   Resp 19   Ht '6\' 2"'$  (1.88 m)   Wt 100 kg   SpO2 98%   BMI 28.31 kg/m  Physical Exam Constitutional:      General: He is not in acute distress. HENT:     Head: Normocephalic and atraumatic.  Eyes:     Conjunctiva/sclera: Conjunctivae normal.     Pupils: Pupils are equal, round, and reactive to light.  Cardiovascular:     Rate and Rhythm: Normal rate. Rhythm irregular.     Comments: HR 90-99 bpm Pulmonary:     Effort: Pulmonary effort is normal. No respiratory distress.     Comments: On 4l  [place] Abdominal:     General: There is no distension.     Tenderness: There is no abdominal tenderness.  Skin:    General: Skin is warm and dry.  Neurological:     General: No focal deficit present.     Mental Status: He is alert. Mental status is at baseline.  Psychiatric:        Mood and Affect: Mood normal.        Behavior: Behavior normal.     ED Results / Procedures / Treatments   Labs (all labs ordered are listed, but only abnormal results are displayed) Labs Reviewed  CBC WITH DIFFERENTIAL/PLATELET - Abnormal; Notable for the following components:      Result Value   RBC 3.76 (*)    Hemoglobin 11.7 (*)    HCT 35.9 (*)    Platelets 128 (*)    All other components within normal limits  COMPREHENSIVE METABOLIC PANEL - Abnormal; Notable for the following components:   Glucose, Bld 131 (*)    Creatinine, Ser 1.96 (*)    Calcium 8.2 (*)    Total Protein 6.3 (*)    GFR, Estimated 34 (*)    All other components within normal limits  URINALYSIS, ROUTINE W REFLEX MICROSCOPIC - Abnormal; Notable for the following components:   Glucose, UA >=500 (*)    Hgb urine dipstick SMALL (*)    Nitrite POSITIVE (*)    Leukocytes,Ua TRACE (*)    Bacteria, UA FEW (*)    All other components within normal limits  BRAIN NATRIURETIC PEPTIDE - Abnormal; Notable for the following components:   B Natriuretic Peptide 390.4 (*)    All other components within normal limits  I-STAT VENOUS BLOOD GAS, ED - Abnormal; Notable for the following components:   pO2, Ven 25 (*)    Bicarbonate 28.3 (*)    Acid-Base Excess 3.0 (*)    Calcium, Ion 1.04 (*)    HCT 35.0 (*)    Hemoglobin 11.9 (*)    All other components within normal limits  I-STAT CHEM 8, ED - Abnormal; Notable for the following components:   Creatinine, Ser 1.90 (*)    Glucose, Bld 124 (*)    Calcium, Ion 1.04 (*)    Hemoglobin 11.9 (*)    HCT 35.0 (*)    All other components within normal limits  TROPONIN I (HIGH SENSITIVITY) -  Abnormal; Notable for the following components:   Troponin I (High Sensitivity) 51 (*)    All other components within normal limits  TROPONIN I (HIGH SENSITIVITY) - Abnormal; Notable for the following components:   Troponin I (High Sensitivity) 51 (*)    All other components within normal limits  RESP PANEL BY RT-PCR (FLU A&B, COVID) ARPGX2  URINE CULTURE  CULTURE, BLOOD (ROUTINE X 2)  CULTURE, BLOOD (ROUTINE X 2)  LACTIC ACID, PLASMA  COMPREHENSIVE METABOLIC PANEL  CBC  MAGNESIUM  PROCALCITONIN  PROCALCITONIN    EKG EKG Interpretation  Date/Time:  Tuesday October 30 2021 15:52:26 EDT Ventricular Rate:  91 PR Interval:    QRS Duration: 97 QT Interval:  357 QTC Calculation: 440 R Axis:   24 Text Interpretation: Atrial fibrillation Nonspecific T abnormalities, lateral leads Confirmed by Octaviano Glow [phone]) on 10/30/2021 5:36:34 PM  Radiology MR BRAIN WO CONTRAST  Result Date: 10/30/2021 CLINICAL DATA:  Dizziness EXAM: MRI HEAD WITHOUT CONTRAST TECHNIQUE: Multiplanar, multiecho pulse sequences of the brain and surrounding structures were obtained without intravenous contrast. COMPARISON:  None Available. FINDINGS: Brain: No acute infarct, mass effect or extra-axial collection. No acute or chronic hemorrhage. There is multifocal hyperintense T2-weighted signal within the white matter. Parenchymal volume and CSF spaces are normal. The midline structures are normal. Vascular: Major flow voids are preserved. Skull and upper cervical spine: Normal calvarium and skull base. Visualized upper cervical spine and soft tissues are normal. Sinuses/Orbits:No paranasal sinus fluid levels or advanced mucosal thickening. No mastoid or middle ear effusion. Normal orbits. IMPRESSION: 1. No acute intracranial abnormality. 2. Findings of chronic small vessel ischemia. Electronically Signed   By: Ulyses Jarred M.D.   On: 10/30/2021 22:28   CT Angio Chest PE W and/or Wo Contrast  Result Date:  10/30/2021 CLINICAL DATA:  Stroke-like symptoms (ataxia, no focal deficits). Found down earlier today with cyanosis and hypoxia. Pulmonary embolism suspected, high probability. EXAM: CT ANGIOGRAPHY CHEST WITH CONTRAST TECHNIQUE: Multidetector CT imaging of the chest was performed using the standard protocol during bolus administration of intravenous contrast. Multiplanar CT image reconstructions and MIPs were obtained to evaluate the vascular anatomy. RADIATION DOSE REDUCTION: This exam was performed according to the departmental dose-optimization program which includes automated exposure control, adjustment of the mA and/or kV according to patient size and/or use of iterative reconstruction technique. CONTRAST:  57m OMNIPAQUE IOHEXOL 350 MG/ML SOLN COMPARISON:  Chest radiographs 10/30/2021 and 03/07/2020. No prior relevant CT. FINDINGS: Cardiovascular: The pulmonary arteries are well opacified with contrast to the level of the subsegmental branches. There is no evidence of acute pulmonary embolism. Atherosclerosis of the aorta, great vessels and coronary arteries without evidence of acute systemic arterial abnormality. The heart size is normal. There is no pericardial effusion. Mediastinum/Nodes: There are no enlarged mediastinal, hilar or axillary lymph nodes.There are small mediastinal and hilar lymph nodes. Possible wall thickening of the midesophagus, suboptimally evaluated. The distal esophagus appears unremarkable. No abnormality of the thyroid gland or trachea seen. Lungs/Pleura: No pleural effusion or pneumothorax. Moderate centrilobular and paraseptal emphysema with interval development (since earlier radiographs) of patchy ground-glass opacities dependently in both lungs, suspicious for possible aspiration. Upper abdomen: Reflux of contrast into the IVC and hepatic veins. No acute findings. Musculoskeletal/Chest wall: There is no chest wall mass or suspicious osseous finding. Mild thoracic spondylosis.  Previous cervical fusion. Review of the MIP images confirms the above findings. IMPRESSION: 1. No evidence of acute pulmonary embolism or other acute vascular findings in the chest. 2. Since earlier chest radiographs, the patient has developed dependent ground-glass opacities in both lungs, suspicious for aspiration. Pulmonary edema considered less likely, although there is reflux of contrast into the IVC and hepatic veins. Radiographic follow up recommended. 3. Coronary and aortic atherosclerosis (ICD10-I70.0). Emphysema (ICD10-J43.9). Electronically Signed   By: WRichardean SaleM.D.   On: 10/30/2021 18:29   CT HEAD  WO CONTRAST (5MM)  Result Date: 10/30/2021 CLINICAL DATA:  Head trauma, minor (Age >= 65y) EXAM: CT HEAD WITHOUT CONTRAST TECHNIQUE: Contiguous axial images were obtained from the base of the skull through the vertex without intravenous contrast. RADIATION DOSE REDUCTION: This exam was performed according to the departmental dose-optimization program which includes automated exposure control, adjustment of the mA and/or kV according to patient size and/or use of iterative reconstruction technique. COMPARISON:  03/07/2020 FINDINGS: Brain: No acute intracranial abnormality. Specifically, no hemorrhage, hydrocephalus, mass lesion, acute infarction, or significant intracranial injury. Vascular: No hyperdense vessel or unexpected calcification. Skull: No acute calvarial abnormality. Sinuses/Orbits: No acute findings Other: None IMPRESSION: No acute intracranial abnormality. Electronically Signed   By: Rolm Baptise M.D.   On: 10/30/2021 18:21   DG Chest Port 1 View  Result Date: 10/30/2021 CLINICAL DATA:  Fever EXAM: PORTABLE CHEST 1 VIEW COMPARISON:  03/07/2020, 09/30/2019 FINDINGS: Hardware in the cervical spine. No acute airspace disease or effusion. Stable cardiomediastinal silhouette. No pneumothorax. IMPRESSION: No active disease. Electronically Signed   By: Donavan Foil M.D.   On: 10/30/2021  16:27    Procedures .Critical Care  Performed by: Wyvonnia Dusky, MD Authorized by: Wyvonnia Dusky, MD   Critical care provider statement:    Critical care time (minutes):  45   Critical care time was exclusive of:  Separately billable procedures and treating other patients   Critical care was necessary to treat or prevent imminent or life-threatening deterioration of the following conditions:  Sepsis   Critical care was time spent personally by me on the following activities:  Ordering and performing treatments and interventions, ordering and review of laboratory studies, ordering and review of radiographic studies, pulse oximetry, review of old charts, examination of patient and evaluation of patient's response to treatment     Medications Ordered in ED Medications  DULoxetine (CYMBALTA) DR capsule 60 mg (has no administration in time range)  QUEtiapine (SEROQUEL) tablet 600 mg (has no administration in time range)  pantoprazole (PROTONIX) EC tablet 40 mg (has no administration in time range)  tamsulosin (FLOMAX) capsule 0.4 mg (has no administration in time range)  apixaban (ELIQUIS) tablet 5 mg (has no administration in time range)  rOPINIRole (REQUIP) tablet 0.25 mg (has no administration in time range)  sodium chloride flush (NS) 0.9 % injection 3 mL (has no administration in time range)  acetaminophen (TYLENOL) tablet 650 mg (has no administration in time range)    Or  acetaminophen (TYLENOL) suppository 650 mg (has no administration in time range)  polyethylene glycol (MIRALAX / GLYCOLAX) packet 17 g (has no administration in time range)  cefTRIAXone (ROCEPHIN) 2 g in sodium chloride 0.9 % 100 mL IVPB (has no administration in time range)  0.9 %  sodium chloride infusion (has no administration in time range)  gabapentin (NEURONTIN) capsule 600-1,200 mg (has no administration in time range)  metoprolol tartrate (LOPRESSOR) tablet 25 mg (has no administration in time range)   sodium chloride 0.9 % bolus 1,000 mL (0 mLs Intravenous Stopped 10/30/21 1700)  sodium chloride 0.9 % bolus 1,000 mL (0 mLs Intravenous Stopped 10/30/21 1716)  vancomycin (VANCOREADY) IVPB 1750 mg/350 mL (0 mg Intravenous Stopped 10/30/21 2034)  iohexol (OMNIPAQUE) 350 MG/ML injection 70 mL (70 mLs Intravenous Contrast Given 10/30/21 1811)    ED Course/ Medical Decision Making/ A&P Clinical Course as of 10/30/21 2259  Tue Oct 30, 2021  1939 Patient's blood pressure remained stable, A-fib under control.  He remains on  2 L nasal cannula.  He will be admitted for pneumonia which may be an aspiration event.  He continues to complain of weakness with walking, and "balance issues" since yesterday, although he has no active ataxia or vertigo.  I have ordered an MRI of the brain to evaluate for posterior lesion or TIA.  He is outside the window for any type of thrombolytic therapy or intervention, and is already on Eliquis.  He will be admitted to the hospitalist for continued IV antibiotics and follow-up on MRI and possible PT evaluation tomorrow.  The patient's son at bedside and his daughter-in-law are both updated in agreement the plan.  Their contact information were also updated. [MT]  2138 Still awaiting callback from the hospitalist.  The patient had to be catheterized as he is not able to urinate, does appear that his urine is consistent with a UTI as well.  This may be the underlying cause in fact of his weakness.  Cefepime is appropriate broad-spectrum coverage for this as well. [MT]  2152 I spoke to patient bed management and appears that there was a miscommunication or that the original admission page never went through at the time of initial consult.  They have been reconsulted and are now paging the hospitalist for admission [MT]  2213 Admitted to hospitalist [MT]    Clinical Course User Index [MT] Nyzier Boivin, Carola Rhine, MD                           Medical Decision Making Amount and/or  Complexity of Data Reviewed Labs: ordered. Radiology: ordered.  Risk Prescription drug management. Decision regarding hospitalization.   This patient presents to the ED with concern for weakness, A Fib with RVR, possible syncope (?). This involves an extensive number of treatment options, and is a complaint that carries with it a high risk of complications and morbidity.  The differential diagnosis includes arrhythmia versus anemia versus congestive heart failure versus pneumonia versus other infection  He has no headache and is mentating well on arrival, NIHSS of 0.  There was concern about "slurred speech" by family yesterday.  This may be multifactorial or related encephalopathy.  However because the patient does not recall whether he fell today and struck his head, was found on the ground, I ordered CT imaging of the brain.  He is on Eliquis.  Co-morbidities that complicate the patient evaluation: History of A-fib, heart failure, Eliquis use, high risk of bleeding, A-fib complications congestive heart failure  Additional history obtained from EMS and family at bedside  External records from outside source obtained and reviewed including cardiogram from 2022  I ordered and personally interpreted labs.  The pertinent results include: Minor leukocytosis, UA consistent with infection  I ordered imaging studies including CT of the head, x-ray of chest, CT PE, MRI brain I independently visualized and interpreted imaging which showed no acute infarct, concern for pneumonia or aspiration on CT PE (no acute PE) I agree with the radiologist interpretation  The patient was maintained on a cardiac monitor.  I personally viewed and interpreted the cardiac monitored which showed an underlying rhythm of: A Fib rate controlled  Per my interpretation the patient's ECG shows A-fib without acute ischemic findings  I ordered medication including broad-spectrum antibiotics for sepsis protocol, fluid  bolus per ideal body weight per sepsis protocol. I have reviewed the patients home medicines and have made adjustments as needed  Test Considered: Doubt acute meningitis do  not feel LP indicated  After the interventions noted above, I reevaluated the patient and found that they have: improved  Blood pressure improved throughout his stay in the ED  Dispostion:  After consideration of the diagnostic results and the patients response to treatment, I feel that the patent would benefit from hospital admission.         Final Clinical Impression(s) / ED Diagnoses Final diagnoses:  Pneumonia due to infectious organism, unspecified laterality, unspecified part of lung  Weakness    Rx / DC Orders ED Discharge Orders     None         Lavaughn Haberle, Carola Rhine, MD 10/30/21 2301

## 2021-10-30 NOTE — H&P (Signed)
History and Physical   Dustin [name] TGG:[id number] DOB: 08-28-1940 DOA: 10/30/2021  PCP: Lujean Amel, MD   Patient coming from: Home  Chief Complaint: Found Down  HPI: Dustin [name] is a 81 y.o. male with medical history significant of BPH, neuropathy, memory loss, depression, GERD, systolic and diastolic heart failure, RLS, A-fib, ataxia, falls presenting after being found down at home.  History provided with assistance of family.  Yesterday family noted patient to be weak and have some intermittent confusion as well as some slurred speech when he was out grocery shopping.  They encouraged him to come to the ED for evaluation yesterday but he declined at that time.  Today patient was found down by a housekeeper at his home with labored breathing.  EMS was called and on their arrival patient was noted to be in A-fib with RVR, hypoxic and hypotensive.  He was placed on oxygen and received IV fluids and was transported to the ED.  Reports chronic intermittently productive cough. Patient now alert and denies fevers, chills, chest pain, shortness of breath, abdominal pain, constipation, diarrhea, nausea, vomiting.  ED Course: Vital signs in the ED significant for blood pressure as low as the 70J systolic but largely in the 500X to 381W systolic.  Also noted to be requiring 2 to 3 L still on oxygen to maintain saturations.  Lab work-up included CMP with creatinine elevated to 1.96 and baseline 1.2, glucose 131, calcium 8.2, protein 6.3.  CBC with hemoglobin stable at 11.7 and platelets of 128.  Lactic acid normal.  Troponin flat at 51x2.  BNP mildly elevated at 390.  Rester panel for flu and COVID-negative.  Urinalysis with hemoglobin, nitrates, leukocytes, bacteria.  Urine culture and blood culture pending.  VBG with normal pH and PCO2.  Imaging work-up included chest x-ray with no acute normality, CT head no acute abnormality, CTA PE study negative for PE but did show new groundglass  opacities at bilateral bases suspicious for aspiration with pulmonary edema being less likely.  MR brain has been ordered and is pending.  Patient received vancomycin, cefepime, 2 L in the ED.  Review of Systems: As per HPI otherwise all other systems reviewed and are negative.  Past Medical History:  Diagnosis Date   BPH (benign prostatic hyperplasia)    Chronic combined systolic and diastolic congestive heart failure (HCC)    GERD (gastroesophageal reflux disease)    protonix for control   Insomnia    Neck pain    Neuropathy     Past Surgical History:  Procedure Laterality Date   ANTERIOR CERVICAL DECOMPRESSION/DISCECTOMY FUSION 4 LEVELS N/A 10/27/2012   Procedure: Cervical Three-Four Cervical Four-Five Cervical Five-Six Cervical Six-Seven  Anterior cervical decompression/diskectomy/fusion;  Surgeon: Erline Levine, MD;  Location: [place] [facility]UR[facility] ORS;  Service: Neurosurgery;  Laterality: N/A;  Cervical Three-Four Cervical Four-Five Cervical Five-Six Cervical Six-Seven  Anterior cervical decompression/diskectomy/fusion   CARDIOVERSION N/A 09/20/2020   Procedure: CARDIOVERSION;  Surgeon: [name], MD;  Location: [facility] ENDOSCOPY;  Service: Cardiovascular;  Laterality: N/A;   FEMUR IM NAIL Right 03/08/2020   Procedure: INTRAMEDULLARY (IM) RETROGRADE FEMORAL NAILING;  Surgeon: Shona Needles, MD;  Location: [place];  Service: Orthopedics;  Laterality: Right;   SHOULDER ARTHROSCOPY  2010   rt shoulder   SHOULDER ARTHROSCOPY  02/15/2011   Procedure: ARTHROSCOPY SHOULDER;  Surgeon: Cynda Familia;  Location: [place];  Service: Orthopedics;  Laterality: Right;  Debridement   TONSILLECTOMY      Social  History  reports that he quit smoking about 31 years ago. His smoking use included cigarettes. He smoked an average of 1.5 packs per day. He has never used smokeless tobacco. He reports that he does not drink alcohol and does not use drugs.  Allergies  Allergen Reactions    Desmopressin Acetate Other (See Comments)    Unknown - Patient denies   Zoloft [Sertraline] Other (See Comments)    Felt bad - Patient denies    Family History  Problem Relation Age of Onset   Emphysema Mother    Cirrhosis Father    Stroke Brother   Reviewed on admission  Prior to Admission medications   Medication Sig Start Date End Date Taking? Authorizing Provider  apixaban (ELIQUIS) 5 MG TABS tablet Take 5 mg by mouth 2 (two) times daily. 06/23/20  Yes [provider]  clotrimazole-betamethasone (LOTRISONE) cream Apply 1 Application topically 2 (two) times daily. 10/16/21  Yes [provider]  DENTA 5000 PLUS 1.1 % CREA dental cream Place 1 Application onto teeth 2 (two) times daily. 06/18/21  Yes [provider]  DULoxetine (CYMBALTA) 60 MG capsule Take 60 mg by mouth daily. 10/10/21  Yes [provider]  FARXIGA 10 MG TABS tablet Take 10 mg by mouth daily. 10/10/21  Yes [provider]  gabapentin (NEURONTIN) 300 MG capsule Take 600-1,200 mg by mouth See admin instructions. 600 mg 4 times a day, and 1200 mg at bedtime. 08/24/20  Yes [provider]  metoprolol tartrate (LOPRESSOR) 25 MG tablet Take 25 mg by mouth 2 (two) times daily. 06/23/20  Yes [provider]  Multiple Vitamin (MULTIVITAMIN WITH MINERALS) TABS tablet Take 1 tablet by mouth daily. 03/18/20  Yes Cristal Deer, MD  naloxone [facility] nasal spray 4 mg/0.1 mL SMARTSIG:Both Nares 10/11/21  Yes [provider]  oxyCODONE (ROXICODONE) 15 MG immediate release tablet Take 15 mg by mouth 4 (four) times daily as needed for pain. 10/25/21  Yes [provider]  pantoprazole (PROTONIX) 40 MG tablet Take 1 tablet (40 mg total) by mouth daily. 03/18/20  Yes Cristal Deer, MD  QUEtiapine (SEROQUEL) 300 MG tablet Take 600 mg by mouth at bedtime. 10/16/21  Yes [provider]  rOPINIRole (REQUIP) 0.25 MG tablet Take 0.25 mg by mouth as needed (restless  leg). 10/10/21  Yes [provider]  silodosin (RAPAFLO) 8 MG CAPS capsule Take 8 mg by mouth daily. 05/28/20  Yes [provider]  Vitamin D, Ergocalciferol, (DRISDOL) 1.25 MG (50000 UNIT) CAPS capsule Take 50,000 Units by mouth once a week. Thursdays 09/09/21  Yes [provider]    Physical Exam: Vitals:   10/30/21 1930 10/30/21 2032 10/30/21 2100 10/30/21 2155  BP: (!) 98/49 123/74 111/89 115/77  Pulse: 91 87 87 84  Resp: '14 16 15 19  '$ Temp:   98.8 F (37.1 C)   TempSrc:   Oral   SpO2: 97% 95% 96% 98%  Weight:      Height:        Physical Exam Constitutional:      General: He is not in acute distress.    Appearance: Normal appearance.  HENT:     Head: Normocephalic and atraumatic.     Mouth/Throat:     Mouth: Mucous membranes are moist.     Pharynx: Oropharynx is clear.  Eyes:     Extraocular Movements: Extraocular movements intact.     Pupils: Pupils are equal, round, and reactive to light.  Cardiovascular:  Rate and Rhythm: Normal rate. Rhythm irregular.     Pulses: Normal pulses.     Heart sounds: Normal heart sounds.  Pulmonary:     Effort: Pulmonary effort is normal. No respiratory distress.     Breath sounds: Normal breath sounds.  Abdominal:     General: Bowel sounds are normal. There is no distension.     Palpations: Abdomen is soft.     Tenderness: There is no abdominal tenderness.  Musculoskeletal:        General: No swelling or deformity.  Skin:    General: Skin is warm and dry.  Neurological:     General: No focal deficit present.     Mental Status: Mental status is at baseline.    Labs on Admission: I have personally reviewed following labs and imaging studies  CBC: Recent Labs  Lab 10/30/21 1605 10/30/21 1609  WBC 9.5  --   NEUTROABS 7.4  --   HGB 11.7* 11.9*  11.9*  HCT 35.9* 35.0*  35.0*  MCV 95.5  --   PLT 128*  --     Basic Metabolic Panel: Recent Labs  Lab 10/30/21 1605 10/30/21 1609  NA 137 137   137  K 4.0 4.0  4.0  CL 102 99  CO2 27  --   GLUCOSE 131* 124*  BUN 18 18  CREATININE 1.96* 1.90*  CALCIUM 8.2*  --     GFR: Estimated Creatinine Clearance: 39.2 mL/min (A) (by C-G formula based on SCr of 1.9 mg/dL (H)).  Liver Function Tests: Recent Labs  Lab 10/30/21 1605  AST 27  ALT 12  ALKPHOS 80  BILITOT 1.1  PROT 6.3*  ALBUMIN 3.5    Urine analysis:    Component Value Date/Time   COLORURINE YELLOW 10/30/2021 2106   APPEARANCEUR CLEAR 10/30/2021 2106   LABSPEC 1.015 10/30/2021 2106   PHURINE 6.0 10/30/2021 2106   GLUCOSEU >=500 (A) 10/30/2021 2106   HGBUR SMALL (A) 10/30/2021 2106   BILIRUBINUR NEGATIVE 10/30/2021 2106   KETONESUR NEGATIVE 10/30/2021 2106   PROTEINUR NEGATIVE 10/30/2021 2106   NITRITE POSITIVE (A) 10/30/2021 2106   LEUKOCYTESUR TRACE (A) 10/30/2021 2106    Radiological Exams on Admission: CT Angio Chest PE W and/or Wo Contrast  Result Date: 10/30/2021 CLINICAL DATA:  Stroke-like symptoms (ataxia, no focal deficits). Found down earlier today with cyanosis and hypoxia. Pulmonary embolism suspected, high probability. EXAM: CT ANGIOGRAPHY CHEST WITH CONTRAST TECHNIQUE: Multidetector CT imaging of the chest was performed using the standard protocol during bolus administration of intravenous contrast. Multiplanar CT image reconstructions and MIPs were obtained to evaluate the vascular anatomy. RADIATION DOSE REDUCTION: This exam was performed according to the departmental dose-optimization program which includes automated exposure control, adjustment of the mA and/or kV according to patient size and/or use of iterative reconstruction technique. CONTRAST:  26m OMNIPAQUE IOHEXOL 350 MG/ML SOLN COMPARISON:  Chest radiographs 10/30/2021 and 03/07/2020. No prior relevant CT. FINDINGS: Cardiovascular: The pulmonary arteries are well opacified with contrast to the level of the subsegmental branches. There is no evidence of acute pulmonary embolism.  Atherosclerosis of the aorta, great vessels and coronary arteries without evidence of acute systemic arterial abnormality. The heart size is normal. There is no pericardial effusion. Mediastinum/Nodes: There are no enlarged mediastinal, hilar or axillary lymph nodes.There are small mediastinal and hilar lymph nodes. Possible wall thickening of the midesophagus, suboptimally evaluated. The distal esophagus appears unremarkable. No abnormality of the thyroid gland or trachea seen. Lungs/Pleura: No pleural effusion or  pneumothorax. Moderate centrilobular and paraseptal emphysema with interval development (since earlier radiographs) of patchy ground-glass opacities dependently in both lungs, suspicious for possible aspiration. Upper abdomen: Reflux of contrast into the IVC and hepatic veins. No acute findings. Musculoskeletal/Chest wall: There is no chest wall mass or suspicious osseous finding. Mild thoracic spondylosis. Previous cervical fusion. Review of the MIP images confirms the above findings. IMPRESSION: 1. No evidence of acute pulmonary embolism or other acute vascular findings in the chest. 2. Since earlier chest radiographs, the patient has developed dependent ground-glass opacities in both lungs, suspicious for aspiration. Pulmonary edema considered less likely, although there is reflux of contrast into the IVC and hepatic veins. Radiographic follow up recommended. 3. Coronary and aortic atherosclerosis (ICD10-I70.0). Emphysema (ICD10-J43.9). Electronically Signed   By: Richardean Sale M.D.   On: 10/30/2021 18:29   CT HEAD WO CONTRAST (5MM)  Result Date: 10/30/2021 CLINICAL DATA:  Head trauma, minor (Age >= 65y) EXAM: CT HEAD WITHOUT CONTRAST TECHNIQUE: Contiguous axial images were obtained from the base of the skull through the vertex without intravenous contrast. RADIATION DOSE REDUCTION: This exam was performed according to the departmental dose-optimization program which includes automated exposure  control, adjustment of the mA and/or kV according to patient size and/or use of iterative reconstruction technique. COMPARISON:  03/07/2020 FINDINGS: Brain: No acute intracranial abnormality. Specifically, no hemorrhage, hydrocephalus, mass lesion, acute infarction, or significant intracranial injury. Vascular: No hyperdense vessel or unexpected calcification. Skull: No acute calvarial abnormality. Sinuses/Orbits: No acute findings Other: None IMPRESSION: No acute intracranial abnormality. Electronically Signed   By: Rolm Baptise M.D.   On: 10/30/2021 18:21   DG Chest Port 1 View  Result Date: 10/30/2021 CLINICAL DATA:  Fever EXAM: PORTABLE CHEST 1 VIEW COMPARISON:  03/07/2020, 09/30/2019 FINDINGS: Hardware in the cervical spine. No acute airspace disease or effusion. Stable cardiomediastinal silhouette. No pneumothorax. IMPRESSION: No active disease. Electronically Signed   By: Donavan Foil M.D.   On: 10/30/2021 16:27    EKG: Independently reviewed.  Atrial fibrillation at 91 bpm.  Nonspecific T wave flattening.  Low voltage multiple leads.  Assessment/Plan Principal Problem:   Acute respiratory failure with hypoxia (HCC) Active Problems:   Peripheral axonal neuropathy   Memory loss   Depression   GERD (gastroesophageal reflux disease)   BPH (benign prostatic hyperplasia)   Chronic combined systolic (congestive) and diastolic (congestive) heart failure (HCC)   Restless leg syndrome   Persistent atrial fibrillation (HCC)   Acute on chronic respiratory failure with hypoxia Aspiration versus pulmonary edema > As per HPI patient was found down.  EMS was called and initially noted to be hypotensive and in A-fib with RVR and hypoxic. > Continues to require 2 to 3 L to maintain saturations in the ED.  Imaging showed new groundglass opacities at the bilateral lower lobes consistent with aspiration event, less likely pulmonary edema. > BNP mildly elevated at 390 and troponin flat at 51x2.  This is  likely secondary to transient A-fib with RVR which was noted by EMS but not present in the ED. > His hypoxia likely secondary to aspiration event given changes on imaging, possibly occurring in time.  Leading up to him being found down.  At this point more likely to be due to the aspiration itself versus pneumonitis.  Aspiration pneumonia possible as well. > Did receive vancomycin and cefepime in the ED.  Normal lactic acid, no leukocytosis. - Monitor on telemetry - Transition antibiotics to ceftriaxone to cover for this and UTI as  below - Continue supplemental oxygen, wean as tolerated, apears to be down to 1-2L already - Procalcitonin  UTI ?Encephalopathy > Family concern for his weakness yesterday as well as some intermittent confusion and slurred speech.  They are concerned about possible stroke.  MRI Brain and CT head without acute abnormality.  Suspect possibly secondary to UTI at this time. > Patient noted to have evidence of UTI with hemoglobin, nitrates, leukocytes, bacteria in urine. > Received vancomycin and cefepime in the ED with initial concern for sepsis. > No focal neurologic deficit on exam in ED. - Monitor on telemetry - Transition antibiotics to ceftriaxone - Follow-up urine cultures  AKI > Patient noted to have creatinine elevated to 1.96 with baseline 1.2. > Has received 2 L IV fluids in the ED. > Does have elevated BNP as above however this is likely secondary to his A-fib with RVR as his heart failure is not severe and his imaging changes are most consistent with aspiration. - Monitor response to IV fluid - Trend renal function electrolytes  Atrial fibrillation Transient rapid ventricular response Transient hypotension > Was in A-fib with RVR with hypotension when initially found by EMS.  This has improved with rate improvement and fluids in the ED. > This may have been triggered either by aspiration event or lead to weight due to hypotension secondary to RVR. >  Currently remains rate controlled on metoprolol and is on chronic Eliquis. - Monitor on telemetry - Continue home metoprolol and Eliquis  Chronic combined systolic and diastolic CHF > Last echo was in 2022 with EF 50 to 55%, indeterminate diastolic dysfunction and normal RV function. > Not currently on any diuretics, does take metoprolol and Farxiga. > Mildly elevated BNP in the ED, likely secondary to A-fib with RVR as above. - Trend renal function and electrolytes - Continue metoprolol - Strict I's and O's, daily weights  GERD - Continue home PPI  BPH - Replace home silodosin with formulary tamsulosin  Neuropathy - Continue home gabapentin  RLS - Continue home propranolol   Memory loss Depression - Continue home duloxetine and Seroquel  DVT prophylaxis: Eliquis Code Status:   DNR Family Communication:  Attempted to contact patient's son by phone however there was no answer. Disposition Plan:   Patient is from:  Home  Anticipated DC to:  Home  Anticipated DC date:  1 to 3 days  Anticipated DC barriers: None  Consults called:  None Admission status:  Observation, telemetry  Severity of Illness: The appropriate patient status for this patient is OBSERVATION. Observation status is judged to be reasonable and necessary in order to provide the required intensity of service to ensure the patient's safety. The patient's presenting symptoms, physical exam findings, and initial radiographic and laboratory data in the context of their medical condition is felt to place them at decreased risk for further clinical deterioration. Furthermore, it is anticipated that the patient will be medically stable for discharge from the hospital within 2 midnights of admission.    Marcelyn Bruins MD [facility]  How to contact the [facility] Attending or Consulting provider [place] or covering provider during after hours [place], for this patient?   Check the care team in [facility] and look for a)  attending/consulting [facility] provider listed and b) the [facility] team listed Log into [email or web address] and use [place]'s universal password to access. If you do not have the password, please contact the hospital operator. Locate the [facility] provider you are looking for  under [facility] and page to a number that you can be directly reached. If you still have difficulty reaching the provider, please page the [facility] (Director on Call) for the Hospitalists listed on amion for assistance.  10/30/2021, 10:17 PM

## 2021-10-30 NOTE — Progress Notes (Addendum)
Pharmacy Antibiotic Note  Dustin [name] is a 81 y.o. male admitted on 10/30/2021 with sepsis. Presented with stroke like symptoms and was found down earlier today and patient arrived with cyanosis noted on lips, hypotensive, and on 4L [place]. Pharmacy has been consulted for Vancomycin and cefepime dosing. Patient weight used is 97.5kg and is 6 foot. Patient Scr is 1.9 (~bl unknown). CrCl ~43 ml/min. Patient is afebrile  Plan: Vancomycin '1750mg'$  IV x1 Vancomycin '1000mg'$  IV every 24 hours. (eAUC 436) Cefepime 2g IV q12 Monitor WBC, temperature, signs of clinical improvement      Allergies  Allergen Reactions   Desmopressin Acetate Other (See Comments)    Unknown - Patient denies   Zoloft [Sertraline] Other (See Comments)    Felt bad - Patient denies    Antimicrobials this admission: 9/12 Vancomycin >>  9/12 cefepime >>   Microbiology results: 9/12 BCx: pending 9/12 UCx: pending  9/12 Sputum: pending   Thank you for allowing pharmacy to be a part of this patient's care.  Sandford Craze, PharmD. [facility] Acute Care PGY-1  10/30/2021 4:20 PM

## 2021-10-31 ENCOUNTER — Encounter (HOSPITAL_COMMUNITY): Payer: Self-pay | Admitting: Internal Medicine

## 2021-10-31 DIAGNOSIS — N401 Enlarged prostate with lower urinary tract symptoms: Secondary | ICD-10-CM | POA: Diagnosis not present

## 2021-10-31 DIAGNOSIS — R338 Other retention of urine: Secondary | ICD-10-CM

## 2021-10-31 DIAGNOSIS — R413 Other amnesia: Secondary | ICD-10-CM

## 2021-10-31 DIAGNOSIS — I5042 Chronic combined systolic (congestive) and diastolic (congestive) heart failure: Secondary | ICD-10-CM | POA: Diagnosis not present

## 2021-10-31 DIAGNOSIS — J189 Pneumonia, unspecified organism: Secondary | ICD-10-CM

## 2021-10-31 DIAGNOSIS — J9601 Acute respiratory failure with hypoxia: Secondary | ICD-10-CM | POA: Diagnosis not present

## 2021-10-31 LAB — COMPREHENSIVE METABOLIC PANEL
ALT: 10 U/L (ref 0–44)
AST: 25 U/L (ref 15–41)
Albumin: 2.9 g/dL — ABNORMAL LOW (ref 3.5–5.0)
Alkaline Phosphatase: 65 U/L (ref 38–126)
Anion gap: 6 (ref 5–15)
BUN: 16 mg/dL (ref 8–23)
CO2: 27 mmol/L (ref 22–32)
Calcium: 8 mg/dL — ABNORMAL LOW (ref 8.9–10.3)
Chloride: 106 mmol/L (ref 98–111)
Creatinine, Ser: 1.63 mg/dL — ABNORMAL HIGH (ref 0.61–1.24)
GFR, Estimated: 42 mL/min — ABNORMAL LOW (ref >60)
Glucose, Bld: 103 mg/dL — ABNORMAL HIGH (ref 70–99)
Potassium: 4.2 mmol/L (ref 3.5–5.1)
Sodium: 139 mmol/L (ref 135–145)
Total Bilirubin: 1.5 mg/dL — ABNORMAL HIGH (ref 0.3–1.2)
Total Protein: 5.3 g/dL — ABNORMAL LOW (ref 6.5–8.1)

## 2021-10-31 LAB — CBC
HCT: 29.8 % — ABNORMAL LOW (ref 39.0–52.0)
Hemoglobin: 9.9 g/dL — ABNORMAL LOW (ref 13.0–17.0)
MCH: 31.5 pg (ref 26.0–34.0)
MCHC: 33.2 g/dL (ref 30.0–36.0)
MCV: 94.9 fL (ref 80.0–100.0)
Platelets: 113 10*3/uL — ABNORMAL LOW (ref 150–400)
RBC: 3.14 MIL/uL — ABNORMAL LOW (ref 4.22–5.81)
RDW: 13.4 % (ref 11.5–15.5)
WBC: 6.7 10*3/uL (ref 4.0–10.5)
nRBC: 0 % (ref 0.0–0.2)

## 2021-10-31 LAB — MAGNESIUM: Magnesium: 1.8 mg/dL (ref 1.7–2.4)

## 2021-10-31 LAB — PROCALCITONIN
Procalcitonin: 1.61 ng/mL
Procalcitonin: 1.43 ng/mL

## 2021-10-31 MED ORDER — INFLUENZA VAC A&B SA ADJ QUAD 0.5 ML IM PRSY
0.5000 mL | PREFILLED_SYRINGE | INTRAMUSCULAR | Status: DC
  Filled 2021-10-31: qty 0.5

## 2021-10-31 MED ORDER — ALUM & MAG HYDROXIDE-SIMETH 200-200-20 MG/5ML PO SUSP
15.0000 mL | Freq: Once | ORAL | Status: AC
  Administered 2021-10-31: 15 mL via ORAL
  Filled 2021-10-31: qty 30

## 2021-10-31 MED ORDER — CHLORHEXIDINE GLUCONATE CLOTH 2 % EX PADS
6.0000 | MEDICATED_PAD | Freq: Every day | CUTANEOUS | Status: DC
  Administered 2021-11-01 – 2021-11-02 (×2): 6 via TOPICAL

## 2021-10-31 MED ORDER — SODIUM CHLORIDE 0.9 % IV SOLN: INTRAVENOUS | Status: AC

## 2021-10-31 NOTE — Progress Notes (Signed)
   10/31/21 1700  Mobility  Activity Ambulated with assistance in hallway  Level of Assistance Contact guard assist, steadying assist  Assistive Device Front wheel walker  Distance Ambulated (ft) 70 ft  Activity Response Tolerated well  $Mobility charge 1 Mobility   Mobility Specialist Progress Note  Pt was in bed and agreeable. Mobility cut short d/t fatigue. Back in bed w/ all needs met and call bell in reach.   Dustin [name] Mobility Specialist

## 2021-10-31 NOTE — Plan of Care (Signed)
  Problem: Education: Goal: Knowledge of General Education information will improve Description: Including pain rating scale, medication(s)/side effects and non-pharmacologic comfort measures Outcome: Progressing   Problem: Health Behavior/Discharge Planning: Goal: Ability to manage health-related needs will improve Outcome: Progressing   Problem: Clinical Measurements: Goal: Ability to maintain clinical measurements within normal limits will improve Outcome: Progressing   Problem: Clinical Measurements: Goal: Respiratory complications will improve Outcome: Progressing   Problem: Clinical Measurements: Goal: Cardiovascular complication will be avoided Outcome: Progressing   Problem: Activity: Goal: Risk for activity intolerance will decrease Outcome: Progressing   

## 2021-10-31 NOTE — Progress Notes (Signed)
Patient arrived to unit. VSS. Cardiac telemetry initiated and verified. Patient requesting pain medicine for BLE pain. Will look at orders. No other complaints at this time.

## 2021-10-31 NOTE — Plan of Care (Signed)
  Problem: Health Behavior/Discharge Planning: Goal: Ability to manage health-related needs will improve Outcome: Progressing   Problem: Education: Goal: Knowledge of General Education information will improve Description: Including pain rating scale, medication(s)/side effects and non-pharmacologic comfort measures Outcome: Progressing   

## 2021-10-31 NOTE — Evaluation (Addendum)
Clinical/Bedside Swallow Evaluation Patient Details  Name: Dustin [name] MRN: [id number] Date of Birth: 25-Apr-1940  Today's Date: 10/31/2021 Time: SLP Start Time (ACUTE ONLY): 1339 (Simultaneous filing. User may not have seen previous data.) SLP Stop Time (ACUTE ONLY): 1348 SLP Time Calculation (min) (ACUTE ONLY): 729 min  Past Medical History:  Past Medical History:  Diagnosis Date   BPH (benign prostatic hyperplasia)    Chronic combined systolic and diastolic congestive heart failure (HCC)    GERD (gastroesophageal reflux disease)    protonix for control   Insomnia    Neck pain    Neuropathy    Past Surgical History:  Past Surgical History:  Procedure Laterality Date   ANTERIOR CERVICAL DECOMPRESSION/DISCECTOMY FUSION 4 LEVELS N/A 10/27/2012   Procedure: Cervical Three-Four Cervical Four-Five Cervical Five-Six Cervical Six-Seven  Anterior cervical decompression/diskectomy/fusion;  Surgeon: Erline Levine, MD;  Location: [facility];  Service: Neurosurgery;  Laterality: N/A;  Cervical Three-Four Cervical Four-Five Cervical Five-Six Cervical Six-Seven  Anterior cervical decompression/diskectomy/fusion   CARDIOVERSION N/A 09/20/2020   Procedure: CARDIOVERSION;  Surgeon: [name], MD;  Location: [facility] ENDOSCOPY;  Service: Cardiovascular;  Laterality: N/A;   FEMUR IM NAIL Right 03/08/2020   Procedure: INTRAMEDULLARY (IM) RETROGRADE FEMORAL NAILING;  Surgeon: Shona Needles, MD;  Location: [facility];  Service: Orthopedics;  Laterality: Right;   SHOULDER ARTHROSCOPY  2010   rt shoulder   SHOULDER ARTHROSCOPY  02/15/2011   Procedure: ARTHROSCOPY SHOULDER;  Surgeon: Cynda Familia;  Location: [facility];  Service: Orthopedics;  Laterality: Right;  Debridement   TONSILLECTOMY     HPI:  Pt is a 81 y.o. male with history of A-fib, systolic and diastolic heart failure, memory loss, GERD. Pt presented to the ED with complaint of SOB, dizziness, weakness and recent  slurred speech after he was found on the ground at home.    Assessment / Plan / Recommendation  Clinical Impression   Pt alert throughout encounter and reports he is not coughing during meals or there is no changes in his swallow function. Pt reports an increase in secretions, however, that is potentially due to change in respiratory status. Oral motor assessment was grossly WFL, however, pt was missing multiple upper molars. Pt given trials of thin liquids, and displayed no s/sx of aspiration despite taking four consecutive sips. No concerns for coordination of swallow/breathing noted and pt not SOB at baseline. Trials of puree and regular consistency also appeared [facility], as no s/sx of aspiration were noted. Due to absence of s/sx noted above, no SLP f/u recommended at this time.  Pt admitted with suspected stroke, found to have UTI and hypoxia. CT negative for stroke. No formal speech language/cognitive assessment needed at this time.   SLP Visit Diagnosis: Dysphagia, unspecified (R13.10)    Aspiration Risk  No limitations    Diet Recommendation Regular;Thin liquid   Liquid Administration via: Straw;Cup Medication Administration: Whole meds with liquid Supervision: Patient able to self feed Compensations: Slow rate;Small sips/bites Postural Changes: Seated upright at 90 degrees    Other  Recommendations Oral Care Recommendations: Oral care BID    Recommendations for follow up therapy are one component of a multi-disciplinary discharge planning process, led by the attending physician.  Recommendations may be updated based on patient status, additional functional criteria and insurance authorization.  Follow up Recommendations No SLP follow up      Assistance Recommended at Discharge None  Functional Status Assessment Patient has not had a recent decline in their  functional status  Frequency and Duration            Prognosis        Swallow Study   General Date of Onset:  10/30/21 HPI: Pt is a 81 y.o. male with history of A-fib, systolic and diastolic heart failure, memory loss, GERD. Pt presented to the ED with complaint of SOB, dizziness, weakness and recent slurred speech after he was found on the ground at home. Type of Study: Bedside Swallow Evaluation Diet Prior to this Study: Regular;Thin liquids Temperature Spikes Noted: No Respiratory Status: Nasal cannula History of Recent Intubation: No Behavior/Cognition: Cooperative;Pleasant mood;Alert Oral Cavity Assessment: Within Functional Limits Oral Care Completed by SLP: No Oral Cavity - Dentition: Adequate natural dentition Vision: Functional for self-feeding Self-Feeding Abilities: Able to feed self Patient Positioning: Upright in bed Baseline Vocal Quality: Normal Volitional Cough: Strong Volitional Swallow: Able to elicit    Oral/Motor/Sensory Function Overall Oral Motor/Sensory Function: Within functional limits   Ice Chips Ice chips: Not tested   Thin Liquid Thin Liquid: Within functional limits Presentation: Straw;Self Fed    Nectar Thick Nectar Thick Liquid: Not tested   Honey Thick Honey Thick Liquid: Not tested   Puree Puree: Within functional limits Presentation: Self Fed;Spoon   Solid     Solid: Within functional limits Presentation: [place] Student SLP  10/31/2021,2:58 PM

## 2021-10-31 NOTE — TOC Progression Note (Signed)
Transition of Care [facility]) - Progression Note    Patient Details  Name: Dustin [name] MRN: [id number] Date of Birth: [birth year]  Transition of Care [facility]) CM/SW Contact  Zenon Mayo, RN Phone Number: 10/31/2021, 4:21 PM  Clinical Narrative:    from home alone, UTI, was found down on the ground. TOC following.        Expected Discharge Plan and Services                                                 Social Determinants of Health (SDOH) Interventions    Readmission Risk Interventions     No data to display

## 2021-10-31 NOTE — Progress Notes (Signed)
PROGRESS NOTE    Dustin [name]  VOZ:[id number] DOB: Jul 12, 1940 DOA: 10/30/2021 PCP: Lujean Amel, MD    Chief Complaint  Patient presents with   Code Sepsis   Fall   Shortness of Breath    Brief Narrative:  Patient is a 81 year old gentleman history of BPH, neuropathy, memory loss, depression, GERD, systolic and diastolic CHF, RLS, A-fib, ataxia presents after being found down at home.  Patient's family also noted patient with some intermittent confusion.  Patient initially was asked to come to the ED however declined however on day of admission housekeeper at his home noted patient with labored breathing EMS was called patient noted to be in A-fib with RVR, hypoxic and hypotensive placed on oxygen and received IV fluids and brought to the ED.  Patient noted in the ED to have blood pressures low in the 90s but improved to the 425Z to 563O systolic and currently on the O2.  Troponins noted at 51 but flat.  BNP noted at 390.  Respiratory viral panel COVID-19 PCR negative.  Urinalysis concerning for UTI.  CT angiogram chest was negative for PE however did show groundglass opacities at bilateral bases suspicious for aspiration with pulmonary edema being less likely.  MRI brain negative.  Patient placed on empiric IV antibiotics.   Assessment & Plan:   Principal Problem:   Acute respiratory failure with hypoxia (HCC) Active Problems:   Peripheral axonal neuropathy   Memory loss   Depression   GERD (gastroesophageal reflux disease)   BPH (benign prostatic hyperplasia)   Chronic combined systolic (congestive) and diastolic (congestive) heart failure (HCC)   Restless leg syndrome   Persistent atrial fibrillation (HCC)  #1 acute on chronic respiratory failure with hypoxia/aspiration pneumonia versus pulmonary edema -Patient noted to be found down when EMS was called initially noted to be hypotensive in A-fib with RVR and hypoxic. -Currently on 2 to 3 L nasal cannula CT angiogram chest  negative for PE however concerning for new groundglass opacities at the bilateral lower lobes consistent with aspiration event.  BNP noted to be mildly elevated at 390, troponin is flat at 51x2. -Hypoxia felt to be likely secondary to aspiration event. -SLP evaluation -Patient noted to have received IV Vanco and cefepime in the ED currently on IV Rocephin. -Supportive care.  2.  UTI -Urine cultures pending. -Continue IV Rocephin.  3.  Acute metabolic encephalopathy -Likely secondary to acute infection of UTI, urine cultures pending. -Patient noted to have received vancomycin and cefepime in the ED and transition to IV Rocephin. -Supportive care.  4.  Urinary retention -Per RN bladder scan noted approximately at 1000 cc of urine noted.  Patient with history of BPH with complaints of suprapubic abdominal discomfort. -Foley catheter ordered this morning with significant urine output of 1.1 L after Foley catheter was immediately placed. -Continue Foley catheter may need to be discharged with Foley catheter with outpatient follow-up with urology for voiding trial. -Continue home regimen Flomax.  5.  AKI -Patient noted to have a creatinine elevated 1.96 from baseline of 1.2. -Patient noted to be hypotensive in the ED received IV fluids. -Patient noted to have elevated BNP however felt likely to transient A-fib. -Patient also noted with urinary retention and Foley catheter placed. -Creatinine currently at 1.63 from 1.96 on admission. -Follow-up.  6.  Atrial fibrillation with transient RVR/transient hypotension -Patient noted to be in A-fib with RVR with hypotension when initially found by EMS which improved with IV fluids in the ED felt likely  triggered by aspiration event. -Currently rate controlled on metoprolol, continue Eliquis for anticoagulation.  7.  Chronic combined systolic diastolic CHF -2D echo from 2022 with EF of 50 to 55%, indeterminate diastolic dysfunction, normal RV  function. -Not on diuretics currently on metoprolol and farixga. -Patient noted to have mildly elevated BNP in the ED felt secondary to A-fib with RVR. -Continue metoprolol. -Strict I's and O's, daily weights. -Follow-up.  8.  GERD -PPI.  9.  BPH -Continued home tamsulosin.  10.  Neuropathy -Gabapentin.  11.  Depression/memory loss -Continue home regimen duloxetine and Seroquel.   DVT prophylaxis: Eliquis Code Status: Full Family Communication: Updated patient.  No family at bedside. Disposition: Likely home with home health.  Status is: Observation The patient remains OBS appropriate and will d/c before 2 midnights.   Consultants:  None  Procedures:  CT head 10/30/2021 CT angiogram chest 10/30/2021 Chest x-ray 10/30/2021 MRI brain 10/30/2021    Antimicrobials:  IV cefepime 10/30/2021 x 1 dose. IV Rocephin 10/31/2021>>>> IV vancomycin 10/30/2021 x 1 dose.   Subjective: Patient sitting up in bed eating his lunch.  Denies any chest pain.  No shortness of breath.  Overall feeling better.  Alert oriented to self place and time.  Knows who the president is.  Stated significant lower abdominal discomfort improved significantly after Foley catheter was placed this morning and greater than a liter urine output noted.  Objective: Vitals:   10/31/21 0424 10/31/21 0728 10/31/21 0947 10/31/21 1115  BP: 90/61 122/72  112/74  Pulse: (!) 102 (!) 103  89  Resp: '18 19  13  '$ Temp: 98 F (36.7 C) 97.6 F (36.4 C)    TempSrc: Oral Oral  Oral  SpO2: 96% 91% 93% 92%  Weight:      Height:        Intake/Output Summary (Last 24 hours) at 10/31/2021 1249 Last data filed at 10/31/2021 1017 Gross per 24 hour  Intake 2696.45 ml  Output 3202 ml  Net -505.55 ml   Filed Weights   10/30/21 1648 10/31/21 0117  Weight: 100 kg 100.4 kg    Examination:  General exam: Appears calm and comfortable  Respiratory system: Coarse breath sounds in the bases bilaterally.  Cardiovascular  system: Irregularly irregular. No JVD, murmurs, rubs, gallops or clicks. No pedal edema. Gastrointestinal system: Abdomen is nondistended, soft and nontender. No organomegaly or masses felt. Normal bowel sounds heard. Central nervous system: Alert and oriented. No focal neurological deficits. Extremities: Symmetric 5 x 5 power. Skin: No rashes, lesions or ulcers Psychiatry: Judgement and insight appear normal. Mood & affect appropriate.     Data Reviewed: I have personally reviewed following labs and imaging studies  CBC: Recent Labs  Lab 10/30/21 1605 10/30/21 1609 10/31/21 0449  WBC 9.5  --  6.7  NEUTROABS 7.4  --   --   HGB 11.7* 11.9*  11.9* 9.9*  HCT 35.9* 35.0*  35.0* 29.8*  MCV 95.5  --  94.9  PLT 128*  --  113*    Basic Metabolic Panel: Recent Labs  Lab 10/30/21 1605 10/30/21 1609 10/30/21 1924 10/31/21 0449  NA 137 137  137  --  139  K 4.0 4.0  4.0  --  4.2  CL 102 99  --  106  CO2 27  --   --  27  GLUCOSE 131* 124*  --  103*  BUN 18 18  --  16  CREATININE 1.96* 1.90*  --  1.63*  CALCIUM 8.2*  --   --  8.0*  MG  --   --  1.8  --     GFR: Estimated Creatinine Clearance: 45.8 mL/min (A) (by C-G formula based on SCr of 1.63 mg/dL (H)).  Liver Function Tests: Recent Labs  Lab 10/30/21 1605 10/31/21 0449  AST 27 25  ALT 12 10  ALKPHOS 80 65  BILITOT 1.1 1.5*  PROT 6.3* 5.3*  ALBUMIN 3.5 2.9*    CBG: No results for input(s): "GLUCAP" in the last 168 hours.   Recent Results (from the past 240 hour(s))  Culture, blood (routine x 2)     Status: None (Preliminary result)   Collection Time: 10/30/21  4:09 PM   Specimen: BLOOD  Result Value Ref Range Status   Specimen Description BLOOD RIGHT ANTECUBITAL  Final   Special Requests   Final    BOTTLES DRAWN AEROBIC AND ANAEROBIC Blood Culture adequate volume   Culture   Final    NO GROWTH < 24 HOURS Performed at [facility], [address], [place], [place] [address]    Report Status  PENDING  Incomplete  Resp Panel by RT-PCR (Flu A&B, Covid) Anterior Nasal Swab     Status: None   Collection Time: 10/30/21  4:09 PM   Specimen: Anterior Nasal Swab  Result Value Ref Range Status   SARS Coronavirus 2 by RT PCR NEGATIVE NEGATIVE Final    Comment: (NOTE) SARS-CoV-2 target nucleic acids are NOT DETECTED.  The SARS-CoV-2 RNA is generally detectable in upper respiratory specimens during the acute phase of infection. The lowest concentration of SARS-CoV-2 viral copies this assay can detect is 138 copies/mL. A negative result does not preclude SARS-Cov-2 infection and should not be used as the sole basis for treatment or other patient management decisions. A negative result may occur with  improper specimen collection/handling, submission of specimen other than nasopharyngeal swab, presence of viral mutation(s) within the areas targeted by this assay, and inadequate number of viral copies(<138 copies/mL). A negative result must be combined with clinical observations, patient history, and epidemiological information. The expected result is Negative.  Fact Sheet for Patients:  EntrepreneurPulse.com.au  Fact Sheet for Healthcare Providers:  IncredibleEmployment.be  This test is no t yet approved or cleared by the [place] FDA and  has been authorized for detection and/or diagnosis of SARS-CoV-2 by FDA under an Emergency Use Authorization (EUA). This EUA will remain  in effect (meaning this test can be used) for the duration of the COVID-19 declaration under Section 564(b)(1) of the Act, 21 U.S.C.section 360bbb-3(b)(1), unless the authorization is terminated  or revoked sooner.       Influenza A by PCR NEGATIVE NEGATIVE Final   Influenza B by PCR NEGATIVE NEGATIVE Final    Comment: (NOTE) The Xpert Xpress SARS-CoV-2/FLU/RSV plus assay is intended as an aid in the diagnosis of influenza from Nasopharyngeal swab specimens  and should not be used as a sole basis for treatment. Nasal washings and aspirates are unacceptable for Xpert Xpress SARS-CoV-2/FLU/RSV testing.  Fact Sheet for Patients: EntrepreneurPulse.com.au  Fact Sheet for Healthcare Providers: IncredibleEmployment.be  This test is not yet approved or cleared by the [place] FDA and has been authorized for detection and/or diagnosis of SARS-CoV-2 by FDA under an Emergency Use Authorization (EUA). This EUA will remain in effect (meaning this test can be used) for the duration of the COVID-19 declaration under Section 564(b)(1) of the Act, 21 U.S.C. section 360bbb-3(b)(1), unless the authorization is terminated or revoked.  Performed at [facility], [place] [address] [place], [place] [address]   Culture, blood (routine x 2)     Status: None (Preliminary result)   Collection Time: 10/30/21  4:10 PM   Specimen: BLOOD  Result Value Ref Range Status   Specimen Description BLOOD BLOOD RIGHT WRIST  Final   Special Requests   Final    BOTTLES DRAWN AEROBIC AND ANAEROBIC Blood Culture adequate volume   Culture   Final    NO GROWTH < 24 HOURS Performed at [facility], [place] [address], [place], [place] [address]    Report Status PENDING  Incomplete         Radiology Studies: MR BRAIN WO CONTRAST  Result Date: 10/30/2021 CLINICAL DATA:  Dizziness EXAM: MRI HEAD WITHOUT CONTRAST TECHNIQUE: Multiplanar, multiecho pulse sequences of the brain and surrounding structures were obtained without intravenous contrast. COMPARISON:  None Available. FINDINGS: Brain: No acute infarct, mass effect or extra-axial collection. No acute or chronic hemorrhage. There is multifocal hyperintense T2-weighted signal within the white matter. Parenchymal volume and CSF spaces are normal. The midline structures are normal. Vascular: Major flow voids are preserved. Skull and upper cervical spine: Normal calvarium and skull  base. Visualized upper cervical spine and soft tissues are normal. Sinuses/Orbits:No paranasal sinus fluid levels or advanced mucosal thickening. No mastoid or middle ear effusion. Normal orbits. IMPRESSION: 1. No acute intracranial abnormality. 2. Findings of chronic small vessel ischemia. Electronically Signed   By: Ulyses Jarred M.D.   On: 10/30/2021 22:28   CT Angio Chest PE W and/or Wo Contrast  Result Date: 10/30/2021 CLINICAL DATA:  Stroke-like symptoms (ataxia, no focal deficits). Found down earlier today with cyanosis and hypoxia. Pulmonary embolism suspected, high probability. EXAM: CT ANGIOGRAPHY CHEST WITH CONTRAST TECHNIQUE: Multidetector CT imaging of the chest was performed using the standard protocol during bolus administration of intravenous contrast. Multiplanar CT image reconstructions and MIPs were obtained to evaluate the vascular anatomy. RADIATION DOSE REDUCTION: This exam was performed according to the departmental dose-optimization program which includes automated exposure control, adjustment of the mA and/or kV according to patient size and/or use of iterative reconstruction technique. CONTRAST:  60m OMNIPAQUE IOHEXOL 350 MG/ML SOLN COMPARISON:  Chest radiographs 10/30/2021 and 03/07/2020. No prior relevant CT. FINDINGS: Cardiovascular: The pulmonary arteries are well opacified with contrast to the level of the subsegmental branches. There is no evidence of acute pulmonary embolism. Atherosclerosis of the aorta, great vessels and coronary arteries without evidence of acute systemic arterial abnormality. The heart size is normal. There is no pericardial effusion. Mediastinum/Nodes: There are no enlarged mediastinal, hilar or axillary lymph nodes.There are small mediastinal and hilar lymph nodes. Possible wall thickening of the midesophagus, suboptimally evaluated. The distal esophagus appears unremarkable. No abnormality of the thyroid gland or trachea seen. Lungs/Pleura: No pleural  effusion or pneumothorax. Moderate centrilobular and paraseptal emphysema with interval development (since earlier radiographs) of patchy ground-glass opacities dependently in both lungs, suspicious for possible aspiration. Upper abdomen: Reflux of contrast into the IVC and hepatic veins. No acute findings. Musculoskeletal/Chest wall: There is no chest wall mass or suspicious osseous finding. Mild thoracic spondylosis. Previous cervical fusion. Review of the MIP images confirms the above findings. IMPRESSION: 1. No evidence of acute pulmonary embolism or other acute vascular findings in the chest. 2. Since earlier chest radiographs, the patient has developed dependent ground-glass opacities in both lungs, suspicious for aspiration. Pulmonary edema considered less likely, although there is reflux of contrast into the IVC and hepatic veins.  Radiographic follow up recommended. 3. Coronary and aortic atherosclerosis (ICD10-I70.0). Emphysema (ICD10-J43.9). Electronically Signed   By: Richardean Sale M.D.   On: 10/30/2021 18:29   CT HEAD WO CONTRAST (5MM)  Result Date: 10/30/2021 CLINICAL DATA:  Head trauma, minor (Age >= 65y) EXAM: CT HEAD WITHOUT CONTRAST TECHNIQUE: Contiguous axial images were obtained from the base of the skull through the vertex without intravenous contrast. RADIATION DOSE REDUCTION: This exam was performed according to the departmental dose-optimization program which includes automated exposure control, adjustment of the mA and/or kV according to patient size and/or use of iterative reconstruction technique. COMPARISON:  03/07/2020 FINDINGS: Brain: No acute intracranial abnormality. Specifically, no hemorrhage, hydrocephalus, mass lesion, acute infarction, or significant intracranial injury. Vascular: No hyperdense vessel or unexpected calcification. Skull: No acute calvarial abnormality. Sinuses/Orbits: No acute findings Other: None IMPRESSION: No acute intracranial abnormality. Electronically  Signed   By: Rolm Baptise M.D.   On: 10/30/2021 18:21   DG Chest Port 1 View  Result Date: 10/30/2021 CLINICAL DATA:  Fever EXAM: PORTABLE CHEST 1 VIEW COMPARISON:  03/07/2020, 09/30/2019 FINDINGS: Hardware in the cervical spine. No acute airspace disease or effusion. Stable cardiomediastinal silhouette. No pneumothorax. IMPRESSION: No active disease. Electronically Signed   By: Donavan Foil M.D.   On: 10/30/2021 16:27        Scheduled Meds:  apixaban  5 mg Oral BID   DULoxetine  60 mg Oral Daily   gabapentin  1,200 mg Oral QHS   [START ON 11/01/2021] influenza vaccine adjuvanted  0.5 mL Intramuscular Tomorrow-1000   metoprolol tartrate  25 mg Oral BID   pantoprazole  40 mg Oral Daily   QUEtiapine  600 mg Oral QHS   sodium chloride flush  3 mL Intravenous Q12H   tamsulosin  0.4 mg Oral Daily   Continuous Infusions:  sodium chloride 100 mL/hr at 10/31/21 0949   cefTRIAXone (ROCEPHIN)  IV Stopped (10/31/21 0545)     LOS: 0 days    Time spent: 35 minutes    Irine Seal, MD [facility]   To contact the attending provider between 7A-7P or the covering provider during after hours 7P-7A, please log into the web site [email or web address] and access using universal [place] password for that web site. If you do not have the password, please call the hospital operator.  10/31/2021, 12:49 PM

## 2021-11-01 DIAGNOSIS — N401 Enlarged prostate with lower urinary tract symptoms: Secondary | ICD-10-CM | POA: Diagnosis not present

## 2021-11-01 DIAGNOSIS — I5042 Chronic combined systolic (congestive) and diastolic (congestive) heart failure: Secondary | ICD-10-CM | POA: Diagnosis not present

## 2021-11-01 DIAGNOSIS — J189 Pneumonia, unspecified organism: Secondary | ICD-10-CM | POA: Diagnosis not present

## 2021-11-01 DIAGNOSIS — J9601 Acute respiratory failure with hypoxia: Secondary | ICD-10-CM | POA: Diagnosis not present

## 2021-11-01 LAB — CBC WITH DIFFERENTIAL/PLATELET
Abs Immature Granulocytes: 0.02 10*3/uL (ref 0.00–0.07)
Basophils Absolute: 0 10*3/uL (ref 0.0–0.1)
Basophils Relative: 0 %
Eosinophils Absolute: 0.3 10*3/uL (ref 0.0–0.5)
Eosinophils Relative: 5 %
HCT: 30.7 % — ABNORMAL LOW (ref 39.0–52.0)
Hemoglobin: 10.3 g/dL — ABNORMAL LOW (ref 13.0–17.0)
Immature Granulocytes: 0 %
Lymphocytes Relative: 25 %
Lymphs Abs: 1.3 10*3/uL (ref 0.7–4.0)
MCH: 31.1 pg (ref 26.0–34.0)
MCHC: 33.6 g/dL (ref 30.0–36.0)
MCV: 92.7 fL (ref 80.0–100.0)
Monocytes Absolute: 0.4 10*3/uL (ref 0.1–1.0)
Monocytes Relative: 9 %
Neutro Abs: 3 10*3/uL (ref 1.7–7.7)
Neutrophils Relative %: 61 %
Platelets: 102 10*3/uL — ABNORMAL LOW (ref 150–400)
RBC: 3.31 MIL/uL — ABNORMAL LOW (ref 4.22–5.81)
RDW: 13.3 % (ref 11.5–15.5)
WBC: 5 10*3/uL (ref 4.0–10.5)
nRBC: 0 % (ref 0.0–0.2)

## 2021-11-01 LAB — MAGNESIUM: Magnesium: 1.9 mg/dL (ref 1.7–2.4)

## 2021-11-01 LAB — BASIC METABOLIC PANEL
Anion gap: 8 (ref 5–15)
BUN: 15 mg/dL (ref 8–23)
CO2: 25 mmol/L (ref 22–32)
Calcium: 8.1 mg/dL — ABNORMAL LOW (ref 8.9–10.3)
Chloride: 107 mmol/L (ref 98–111)
Creatinine, Ser: 1.43 mg/dL — ABNORMAL HIGH (ref 0.61–1.24)
GFR, Estimated: 50 mL/min — ABNORMAL LOW (ref >60)
Glucose, Bld: 101 mg/dL — ABNORMAL HIGH (ref 70–99)
Potassium: 3.6 mmol/L (ref 3.5–5.1)
Sodium: 140 mmol/L (ref 135–145)

## 2021-11-01 LAB — PROCALCITONIN: Procalcitonin: 0.81 ng/mL

## 2021-11-01 MED ORDER — GABAPENTIN 300 MG PO CAPS: 600.0000 mg | ORAL_CAPSULE | ORAL | Status: DC

## 2021-11-01 MED ORDER — GABAPENTIN 300 MG PO CAPS
600.0000 mg | ORAL_CAPSULE | Freq: Three times a day (TID) | ORAL | Status: DC
  Administered 2021-11-01 – 2021-11-02 (×3): 600 mg via ORAL
  Filled 2021-11-01 (×3): qty 2

## 2021-11-01 NOTE — Progress Notes (Signed)
   11/01/21 1432  Mobility  Activity Ambulated with assistance in hallway  Level of Assistance Standby assist, set-up cues, supervision of patient - no hands on  Assistive Device Front wheel walker  Distance Ambulated (ft) 400 ft  Activity Response Tolerated well  $Mobility charge 1 Mobility   Mobility Specialist Progress Note   During Mobility:  94% SpO2 (RA)  Received pt in chair having no complaints and agreeable to mobility. Pt was asymptomatic throughout ambulation and returned to room w/o fault. Left in bed w/ call bell in reach and alarm on.    Lucious Groves Mobility Specialist

## 2021-11-01 NOTE — Evaluation (Signed)
Occupational Therapy Evaluation Patient Details Name: Dustin [name] MRN: [id number] DOB: 23-Mar-1940 Today's Date: 11/01/2021   History of Present Illness Dustin [name] is a 81 y.o. male presenting after being found down at home.Diagnosed with acute respiratory failure with hypoxia (Aspiration versus pulmonary edema), AKI, acute metabolic encephalopathy, and UTI. PHMx: BPH, neuropathy, memory loss, depression, GERD, systolic and diastolic heart failure, RLS, A-fib, ataxia, falls.   Clinical Impression   This 81 yo male admitted with above presents to acute OT with PLOF of being Mod I to Independent with all basic ADLs, some ADLs (has housekeeper every 3 weeks), and drives. Currently he is setup/S-min guard A for all basic ADLs and mobility.He will continue to benefit from acute OT with follow up [place] and intermittent S.      Recommendations for follow up therapy are one component of a multi-disciplinary discharge planning process, led by the attending physician.  Recommendations may be updated based on patient status, additional functional criteria and insurance authorization.   Follow Up Recommendations  Home health OT    Assistance Recommended at Discharge Intermittent Supervision/Assistance  Patient can return home with the following A little help with bathing/dressing/bathroom;A little help with walking and/or transfers;Help with stairs or ramp for entrance;Assist for transportation    Functional Status Assessment  Patient has had a recent decline in their functional status and demonstrates the ability to make significant improvements in function in a reasonable and predictable amount of time.  Equipment Recommendations  None recommended by OT       Precautions / Restrictions Precautions Precautions: Fall Restrictions Weight Bearing Restrictions: No      Mobility Bed Mobility Overal bed mobility: Modified Independent             General bed mobility comments: HOB  up    Transfers Overall transfer level: Needs assistance Equipment used: None Transfers: Sit to/from Stand Sit to Stand: Min guard                  Balance Overall balance assessment: Mild deficits observed, not formally tested                                         ADL either performed or assessed with clinical judgement   ADL Overall ADL's : Needs assistance/impaired Eating/Feeding: Independent;Sitting Eating/Feeding Details (indicate cue type and reason): EOB Grooming: Oral care;Standing;Min guard Grooming Details (indicate cue type and reason): at sink Upper Body Bathing: Set up;Sitting   Lower Body Bathing: Min guard;Sit to/from stand   Upper Body Dressing : Set up;Sitting   Lower Body Dressing: Min guard;Sit to/from stand   Toilet Transfer: Min guard;Ambulation   Toileting- Clothing Manipulation and Hygiene: Min guard;Sit to/from stand               Vision Patient Visual Report: No change from baseline              Pertinent Vitals/Pain Pain Assessment Pain Assessment: 0-10 Faces Pain Scale: Hurts whole lot Pain Location: pins and needles in feet Pain Descriptors / Indicators: Pins and needles Pain Intervention(s): Limited activity within patient's tolerance, Monitored during session, Repositioned     Hand Dominance Left   Extremity/Trunk Assessment Upper Extremity Assessment Upper Extremity Assessment: Overall WFL for tasks assessed           Communication Communication Communication: HOH (hearing aids)   Cognition Arousal/Alertness: Awake/alert  Behavior During Therapy: WFL for tasks assessed/performed Overall Cognitive Status: Within Functional Limits for tasks assessed                                 General Comments: Does not recall why he fell (reports first time he has fallen); A &O x4     General Comments  HR up to 121 (A-fib) started at 88; pt tends to "furniture walk" when he has things to  hold onto            [place] expects to be discharged to:: Private residence Living Arrangements: Alone Available Help at Discharge: Family Type of Home: House Home Access: Stairs to enter [employer] of Steps: 3 Entrance Stairs-Rails: Right;Left;Can reach both [place]: Two level;Able to live on main level with bedroom/bathroom     Bathroom Shower/Tub: Walk-in shower;Door   Bathroom Toilet: Handicapped height     Home Equipment: Grab bars - toilet;Grab bars - tub/shower;Hand held shower head;Shower seat;BSC/3in1   Additional Comments: son and dtr gets together and every couple weeks. They call 2-3 times a week. Housekeeper every 3 weeks.      Prior Functioning/Environment Prior Level of Function : Independent/Modified Independent;Driving                        OT Problem List: Impaired balance (sitting and/or standing)      OT Treatment/Interventions: Self-care/ADL training;DME and/or AE instruction;Patient/family education;Balance training    OT Goals(Current goals can be found in the care plan section) Acute Rehab OT Goals Patient Stated Goal: to go home OT Goal Formulation: With patient Time For Goal Achievement: 11/15/21 Potential to Achieve Goals: Good  OT Frequency: Min 2X/week       AM-PAC OT "6 Clicks" Daily Activity     Outcome Measure Help from another person eating meals?: None Help from another person taking care of personal grooming?: A Little Help from another person toileting, which includes using toliet, bedpan, or urinal?: A Little Help from another person bathing (including washing, rinsing, drying)?: A Little Help from another person to put on and taking off regular upper body clothing?: A Little Help from another person to put on and taking off regular lower body clothing?: A Little 6 Click Score: 19   End of Session Equipment Utilized During Treatment: Gait belt  Activity Tolerance: Patient  tolerated treatment well (mildly SOB, but O2 sats fine) Patient left: in chair;with call bell/phone within reach;with chair alarm set  OT Visit Diagnosis: Unsteadiness on feet (R26.81);Other abnormalities of gait and mobility (R26.89);Pain Pain - Right/Left:  (both) Pain - part of body:  (feet (PN))                Time: 1194-1740 OT Time Calculation (min): 32 min Charges:  OT General Charges $OT Visit: 1 Visit OT Evaluation $OT Eval Moderate Complexity: 1 Mod OT Treatments $Self Care/Home Management : 8-22 mins  [place], OTR/L [facility] [phone] Office [phone]    Almon Register 11/01/2021, 8:36 AM

## 2021-11-01 NOTE — Progress Notes (Addendum)
PROGRESS NOTE    RASUL DECOLA  JME:[id number] DOB: [birth year] DOA: 10/30/2021 PCP: Lujean Amel, MD    Chief Complaint  Patient presents with   Code Sepsis   Fall   Shortness of Breath    Brief Narrative:  Patient is a 81 year old gentleman history of BPH, neuropathy, memory loss, depression, GERD, systolic and diastolic CHF, RLS, A-fib, ataxia presents after being found down at home.  Patient's family also noted patient with some intermittent confusion.  Patient initially was asked to come to the ED however declined however on day of admission housekeeper at his home noted patient with labored breathing EMS was called patient noted to be in A-fib with RVR, hypoxic and hypotensive placed on oxygen and received IV fluids and brought to the ED.  Patient noted in the ED to have blood pressures low in the 90s but improved to the 229N to 989Q systolic and currently on the O2.  Troponins noted at 51 but flat.  BNP noted at 390.  Respiratory viral panel COVID-19 PCR negative.  Urinalysis concerning for UTI.  CT angiogram chest was negative for PE however did show groundglass opacities at bilateral bases suspicious for aspiration with pulmonary edema being less likely.  MRI brain negative.  Patient placed on empiric IV antibiotics.   Assessment & Plan:   Principal Problem:   Acute respiratory failure with hypoxia (HCC) Active Problems:   Peripheral axonal neuropathy   Memory loss   Depression   GERD (gastroesophageal reflux disease)   BPH (benign prostatic hyperplasia)   Chronic combined systolic (congestive) and diastolic (congestive) heart failure (HCC)   Restless leg syndrome   Persistent atrial fibrillation (HCC)   Pneumonia due to infectious organism  #1 acute on chronic respiratory failure with hypoxia/aspiration pneumonia versus pulmonary edema -Patient noted to be found down when EMS was called initially noted to be hypotensive in A-fib with RVR and hypoxic. -Hypoxia  improving currently on room air with sats of 92%.  -CT angiogram chest negative for PE however concerning for new groundglass opacities at the bilateral lower lobes consistent with aspiration event.  BNP noted to be mildly elevated at 390, troponin is flat at 51x2. -Hypoxia felt to be likely secondary to aspiration event. -SLP evaluated. -Patient noted to have received IV Vanco and cefepime in the ED currently on IV Rocephin. -Continue IV antibiotics for another 24 hours and transition to oral antibiotics tomorrow if continued improvement. -Check ambulatory sats. -Supportive care.  2.  UTI -Urine cultures pending. -Continue IV Rocephin.  3.  Acute metabolic encephalopathy -Likely secondary to acute infection of UTI and aspiration pneumonia, urine cultures pending. -Patient noted to have received vancomycin and cefepime in the ED and transitioned to IV Rocephin. -Supportive care.  4.  Urinary retention -Per RN bladder scan noted approximately at 1000 cc of urine noted.  Patient with history of BPH with complaints of suprapubic abdominal discomfort. -Foley catheter placed the morning of 10/31/2021 with output of 1.1 L right after Foley catheter was placed. -Urine output of 2.7 L over the past 24 hours.  -Continue Foley catheter may need to be discharged with Foley catheter with outpatient follow-up with urology for voiding trial. -Continue home regimen Flomax.  5.  AKI -Patient noted to have a creatinine elevated 1.96 from baseline of 1.2. -Patient noted to be hypotensive in the ED received IV fluids. -Patient noted to have elevated BNP however felt likely to transient A-fib. -Patient also noted with urinary retention and Foley catheter placed. -  Patient with urine output of 2.7 L over the past 24 hours. -Renal function improving, creatinine trending down currently at 1.43 from 1.63 from 1.96 on admission. -Repeat labs in the AM.  6.  Persistent atrial fibrillation with transient  RVR/transient hypotension -Patient noted to be in A-fib with RVR with hypotension when initially found by EMS which improved with IV fluids in the ED felt likely triggered by aspiration event. -Continue metoprolol for rate control, Eliquis for anticoagulation.    7.  Chronic combined systolic diastolic CHF -2D echo from 2022 with EF of 50 to 55%, indeterminate diastolic dysfunction, normal RV function. -Not on diuretics currently on metoprolol and farixga. -Patient noted to have mildly elevated BNP in the ED felt secondary to A-fib with RVR. -Continue metoprolol. -Continue to hold farixga due to AKI. -Strict I's and O's, daily weights. -Follow-up.  8.  GERD -PPI.  9.  BPH -Continued home tamsulosin.  10.  Neuropathy -Gabapentin renally dose adjusted..  11.  Depression/memory loss -Stable.  Continue home regimen duloxetine and Seroquel.    DVT prophylaxis: Eliquis Code Status: Full Family Communication: Updated patient.  No family at bedside. Disposition: Likely home with home health.  Status is: Observation The patient remains OBS appropriate and will d/c before 2 midnights.   Consultants:  None  Procedures:  CT head 10/30/2021 CT angiogram chest 10/30/2021 Chest x-ray 10/30/2021 MRI brain 10/30/2021    Antimicrobials:  IV cefepime 10/30/2021 x 1 dose. IV Rocephin 10/31/2021>>>> IV vancomycin 10/30/2021 x 1 dose.   Subjective: Sitting up in chair on room air.  Overall feeling better.  No chest pain.  No shortness of breath.  No abdominal pain.  Asking for the gabapentin to be changed back to his home regimen.  Tolerating current diet.  Objective: Vitals:   10/31/21 2023 11/01/21 0116 11/01/21 0525 11/01/21 0729  BP: 116/60 114/69 127/80 135/65  Pulse: 85 93 76 80  Resp: (!) 22 19 (!) 21 11  Temp: 98.2 F (36.8 C) 97.6 F (36.4 C) 97.9 F (36.6 C) 98.2 F (36.8 C)  TempSrc: Oral Oral Oral Oral  SpO2: 95% 96% 92% 92%  Weight:  100.4 kg    Height:         Intake/Output Summary (Last 24 hours) at 11/01/2021 1139 Last data filed at 11/01/2021 0920 Gross per 24 hour  Intake 2038.68 ml  Output 2250 ml  Net -211.32 ml    Filed Weights   10/30/21 1648 10/31/21 0117 11/01/21 0116  Weight: 100 kg 100.4 kg 100.4 kg    Examination:  General exam: NAD. Respiratory system: Decreased coarse breath sounds in the bases.  No wheezing.  No crackles.  Fair air movement.  Speaking in full sentences.  Cardiovascular system: Irregularly irregular.  No JVD, no murmurs rubs or gallops.  No lower extremity edema.  Gastrointestinal system: Abdomen is soft, nontender, nondistended, positive bowel sounds.  No rebound.  No guarding.  Central nervous system: Alert and oriented.  Moving extremities spontaneously.  No focal neurological deficits.  Extremities: Symmetric 5 x 5 power. Skin: No rashes, lesions or ulcers Psychiatry: Judgement and insight appear normal. Mood & affect appropriate.     Data Reviewed: I have personally reviewed following labs and imaging studies  CBC: Recent Labs  Lab 10/30/21 1605 10/30/21 1609 10/31/21 0449 11/01/21 0336  WBC 9.5  --  6.7 5.0  NEUTROABS 7.4  --   --  3.0  HGB 11.7* 11.9*  11.9* 9.9* 10.3*  HCT 35.9* 35.0*  35.0* 29.8* 30.7*  MCV 95.5  --  94.9 92.7  PLT 128*  --  113* 102*     Basic Metabolic Panel: Recent Labs  Lab 10/30/21 1605 10/30/21 1609 10/30/21 1924 10/31/21 0449 11/01/21 0336  NA 137 137  137  --  139 140  K 4.0 4.0  4.0  --  4.2 3.6  CL 102 99  --  106 107  CO2 27  --   --  27 25  GLUCOSE 131* 124*  --  103* 101*  BUN 18 18  --  16 15  CREATININE 1.96* 1.90*  --  1.63* 1.43*  CALCIUM 8.2*  --   --  8.0* 8.1*  MG  --   --  1.8  --  1.9     GFR: Estimated Creatinine Clearance: 52.2 mL/min (A) (by C-G formula based on SCr of 1.43 mg/dL (H)).  Liver Function Tests: Recent Labs  Lab 10/30/21 1605 10/31/21 0449  AST 27 25  ALT 12 10  ALKPHOS 80 65  BILITOT 1.1 1.5*   PROT 6.3* 5.3*  ALBUMIN 3.5 2.9*     CBG: No results for input(s): "GLUCAP" in the last 168 hours.   Recent Results (from the past 240 hour(s))  Culture, blood (routine x 2)     Status: None (Preliminary result)   Collection Time: 10/30/21  4:09 PM   Specimen: BLOOD  Result Value Ref Range Status   Specimen Description BLOOD RIGHT ANTECUBITAL  Final   Special Requests   Final    BOTTLES DRAWN AEROBIC AND ANAEROBIC Blood Culture adequate volume   Culture   Final    NO GROWTH 2 DAYS Performed at [facility], [address] [place], [place] [address]    Report Status PENDING  Incomplete  Resp Panel by RT-PCR (Flu A&B, Covid) Anterior Nasal Swab     Status: None   Collection Time: 10/30/21  4:09 PM   Specimen: Anterior Nasal Swab  Result Value Ref Range Status   SARS Coronavirus 2 by RT PCR NEGATIVE NEGATIVE Final    Comment: (NOTE) SARS-CoV-2 target nucleic acids are NOT DETECTED.  The SARS-CoV-2 RNA is generally detectable in upper respiratory specimens during the acute phase of infection. The lowest concentration of SARS-CoV-2 viral copies this assay can detect is 138 copies/mL. A negative result does not preclude SARS-Cov-2 infection and should not be used as the sole basis for treatment or other patient management decisions. A negative result may occur with  improper specimen collection/handling, submission of specimen other than nasopharyngeal swab, presence of viral mutation(s) within the areas targeted by this assay, and inadequate number of viral copies(<138 copies/mL). A negative result must be combined with clinical observations, patient history, and epidemiological information. The expected result is Negative.  Fact Sheet for Patients:  EntrepreneurPulse.com.au  Fact Sheet for Healthcare Providers:  IncredibleEmployment.be  This test is no t yet approved or cleared by the [place] FDA and  has been authorized  for detection and/or diagnosis of SARS-CoV-2 by FDA under an Emergency Use Authorization (EUA). This EUA will remain  in effect (meaning this test can be used) for the duration of the COVID-19 declaration under Section 564(b)(1) of the Act, 21 U.S.C.section 360bbb-3(b)(1), unless the authorization is terminated  or revoked sooner.       Influenza A by PCR NEGATIVE NEGATIVE Final   Influenza B by PCR NEGATIVE NEGATIVE Final    Comment: (NOTE) The Xpert Xpress SARS-CoV-2/FLU/RSV plus assay is  intended as an aid in the diagnosis of influenza from Nasopharyngeal swab specimens and should not be used as a sole basis for treatment. Nasal washings and aspirates are unacceptable for Xpert Xpress SARS-CoV-2/FLU/RSV testing.  Fact Sheet for Patients: EntrepreneurPulse.com.au  Fact Sheet for Healthcare Providers: IncredibleEmployment.be  This test is not yet approved or cleared by the [place] FDA and has been authorized for detection and/or diagnosis of SARS-CoV-2 by FDA under an Emergency Use Authorization (EUA). This EUA will remain in effect (meaning this test can be used) for the duration of the COVID-19 declaration under Section 564(b)(1) of the Act, 21 U.S.C. section 360bbb-3(b)(1), unless the authorization is terminated or revoked.  Performed at [facility], [place] [address], [place], [place] [address]   Culture, blood (routine x 2)     Status: None (Preliminary result)   Collection Time: 10/30/21  4:10 PM   Specimen: BLOOD  Result Value Ref Range Status   Specimen Description BLOOD BLOOD RIGHT WRIST  Final   Special Requests   Final    BOTTLES DRAWN AEROBIC AND ANAEROBIC Blood Culture adequate volume   Culture   Final    NO GROWTH 2 DAYS Performed at [facility], [place] [address] [place], [place] [address]    Report Status PENDING  Incomplete         Radiology Studies: MR BRAIN WO CONTRAST  Result Date:  10/30/2021 CLINICAL DATA:  Dizziness EXAM: MRI HEAD WITHOUT CONTRAST TECHNIQUE: Multiplanar, multiecho pulse sequences of the brain and surrounding structures were obtained without intravenous contrast. COMPARISON:  None Available. FINDINGS: Brain: No acute infarct, mass effect or extra-axial collection. No acute or chronic hemorrhage. There is multifocal hyperintense T2-weighted signal within the white matter. Parenchymal volume and CSF spaces are normal. The midline structures are normal. Vascular: Major flow voids are preserved. Skull and upper cervical spine: Normal calvarium and skull base. Visualized upper cervical spine and soft tissues are normal. Sinuses/Orbits:No paranasal sinus fluid levels or advanced mucosal thickening. No mastoid or middle ear effusion. Normal orbits. IMPRESSION: 1. No acute intracranial abnormality. 2. Findings of chronic small vessel ischemia. Electronically Signed   By: Ulyses Jarred M.D.   On: 10/30/2021 22:28   CT Angio Chest PE W and/or Wo Contrast  Result Date: 10/30/2021 CLINICAL DATA:  Stroke-like symptoms (ataxia, no focal deficits). Found down earlier today with cyanosis and hypoxia. Pulmonary embolism suspected, high probability. EXAM: CT ANGIOGRAPHY CHEST WITH CONTRAST TECHNIQUE: Multidetector CT imaging of the chest was performed using the standard protocol during bolus administration of intravenous contrast. Multiplanar CT image reconstructions and MIPs were obtained to evaluate the vascular anatomy. RADIATION DOSE REDUCTION: This exam was performed according to the departmental dose-optimization program which includes automated exposure control, adjustment of the mA and/or kV according to patient size and/or use of iterative reconstruction technique. CONTRAST:  99m OMNIPAQUE IOHEXOL 350 MG/ML SOLN COMPARISON:  Chest radiographs 10/30/2021 and 03/07/2020. No prior relevant CT. FINDINGS: Cardiovascular: The pulmonary arteries are well opacified with contrast to the  level of the subsegmental branches. There is no evidence of acute pulmonary embolism. Atherosclerosis of the aorta, great vessels and coronary arteries without evidence of acute systemic arterial abnormality. The heart size is normal. There is no pericardial effusion. Mediastinum/Nodes: There are no enlarged mediastinal, hilar or axillary lymph nodes.There are small mediastinal and hilar lymph nodes. Possible wall thickening of the midesophagus, suboptimally evaluated. The distal esophagus appears unremarkable. No abnormality of the thyroid gland or trachea seen. Lungs/Pleura:  No pleural effusion or pneumothorax. Moderate centrilobular and paraseptal emphysema with interval development (since earlier radiographs) of patchy ground-glass opacities dependently in both lungs, suspicious for possible aspiration. Upper abdomen: Reflux of contrast into the IVC and hepatic veins. No acute findings. Musculoskeletal/Chest wall: There is no chest wall mass or suspicious osseous finding. Mild thoracic spondylosis. Previous cervical fusion. Review of the MIP images confirms the above findings. IMPRESSION: 1. No evidence of acute pulmonary embolism or other acute vascular findings in the chest. 2. Since earlier chest radiographs, the patient has developed dependent ground-glass opacities in both lungs, suspicious for aspiration. Pulmonary edema considered less likely, although there is reflux of contrast into the IVC and hepatic veins. Radiographic follow up recommended. 3. Coronary and aortic atherosclerosis (ICD10-I70.0). Emphysema (ICD10-J43.9). Electronically Signed   By: Richardean Sale M.D.   On: 10/30/2021 18:29   CT HEAD WO CONTRAST (5MM)  Result Date: 10/30/2021 CLINICAL DATA:  Head trauma, minor (Age >= 65y) EXAM: CT HEAD WITHOUT CONTRAST TECHNIQUE: Contiguous axial images were obtained from the base of the skull through the vertex without intravenous contrast. RADIATION DOSE REDUCTION: This exam was performed  according to the departmental dose-optimization program which includes automated exposure control, adjustment of the mA and/or kV according to patient size and/or use of iterative reconstruction technique. COMPARISON:  03/07/2020 FINDINGS: Brain: No acute intracranial abnormality. Specifically, no hemorrhage, hydrocephalus, mass lesion, acute infarction, or significant intracranial injury. Vascular: No hyperdense vessel or unexpected calcification. Skull: No acute calvarial abnormality. Sinuses/Orbits: No acute findings Other: None IMPRESSION: No acute intracranial abnormality. Electronically Signed   By: Rolm Baptise M.D.   On: 10/30/2021 18:21   DG Chest Port 1 View  Result Date: 10/30/2021 CLINICAL DATA:  Fever EXAM: PORTABLE CHEST 1 VIEW COMPARISON:  03/07/2020, 09/30/2019 FINDINGS: Hardware in the cervical spine. No acute airspace disease or effusion. Stable cardiomediastinal silhouette. No pneumothorax. IMPRESSION: No active disease. Electronically Signed   By: Donavan Foil M.D.   On: 10/30/2021 16:27        Scheduled Meds:  apixaban  5 mg Oral BID   Chlorhexidine Gluconate Cloth  6 each Topical Q0600   DULoxetine  60 mg Oral Daily   gabapentin  1,200 mg Oral QHS   influenza vaccine adjuvanted  0.5 mL Intramuscular Tomorrow-1000   metoprolol tartrate  25 mg Oral BID   pantoprazole  40 mg Oral Daily   QUEtiapine  600 mg Oral QHS   sodium chloride flush  3 mL Intravenous Q12H   tamsulosin  0.4 mg Oral Daily   Continuous Infusions:  cefTRIAXone (ROCEPHIN)  IV 2 g (11/01/21 0524)     LOS: 0 days    Time spent: 35 minutes    Irine Seal, MD [facility]   To contact the attending provider between 7A-7P or the covering provider during after hours 7P-7A, please log into the web site [email or web address] and access using universal [place] password for that web site. If you do not have the password, please call the hospital operator.  11/01/2021, 11:39 AM

## 2021-11-01 NOTE — Plan of Care (Signed)
  Problem: Elimination: Goal: Will not experience complications related to urinary retention Outcome: Progressing   Problem: Skin Integrity: Goal: Risk for impaired skin integrity will decrease Outcome: Progressing   

## 2021-11-01 NOTE — Evaluation (Signed)
Physical Therapy Evaluation Patient Details Name: Dustin [name] MRN: [id number] DOB: 1941-02-14 Today's Date: 11/01/2021  History of Present Illness  Dustin [name] is a 81 y.o. male presenting after being found down at home.Diagnosed with acute respiratory failure with hypoxia (Aspiration versus pulmonary edema), AKI, acute metabolic encephalopathy, and UTI. PHMx: BPH, neuropathy, memory loss, depression, GERD, systolic and diastolic heart failure, RLS, A-fib, ataxia, falls.  Clinical Impression  Pt presents to PT with slight decr in mobility compared to baseline. At home pt typically ambulatory in the home without assistive device and "furniture walks" as needed. Uses cane when out. Pt also has a walker at home but doesn't use. Recommend return home with HHPT and recommended to pt he use his cane and walker as needed but expect he will return to his baseline fairly quickly.        Recommendations for follow up therapy are one component of a multi-disciplinary discharge planning process, led by the attending physician.  Recommendations may be updated based on patient status, additional functional criteria and insurance authorization.  Follow Up Recommendations Home health PT      Assistance Recommended at Discharge Intermittent Supervision/Assistance  Patient can return home with the following  Help with stairs or ramp for entrance    Equipment Recommendations None recommended by PT  Recommendations for Other Services       Functional Status Assessment Patient has had a recent decline in their functional status and demonstrates the ability to make significant improvements in function in a reasonable and predictable amount of time.     Precautions / Restrictions Precautions Precautions: Fall Restrictions Weight Bearing Restrictions: No      Mobility  Bed Mobility               General bed mobility comments: Pt up in chair    Transfers Overall transfer level: Needs  assistance Equipment used: Rolling walker (2 wheels), Straight cane Transfers: Sit to/from Stand Sit to Stand: Min guard                Ambulation/Gait Ambulation/Gait assistance: Min guard, Supervision Gait Distance (Feet): 180 Feet (180' x 1, 30' x 1) Assistive device: Straight cane, Rolling walker (2 wheels) Gait Pattern/deviations: Step-through pattern, Decreased stride length, Trunk flexed, Wide base of support Gait velocity: decr Gait velocity interpretation: 1.31 - 2.62 ft/sec, indicative of limited community ambulator   General Gait Details: Assist for safety with pt using straight cane or rolling walker. In small environment like room pt uses furniture support at times  Financial trader Rankin (Stroke Patients Only)       Balance Overall balance assessment: Needs assistance Sitting-balance support: No upper extremity supported, Feet supported Sitting balance-Leahy Scale: Normal     Standing balance support: No upper extremity supported Standing balance-Leahy Scale: Fair                               Pertinent Vitals/Pain Pain Assessment Pain Assessment: No/denies pain    Home Living Family/patient expects to be discharged to:: Private residence Living Arrangements: Alone Available Help at Discharge: Family Type of Home: House Home Access: Stairs to enter Entrance Stairs-Rails: Right;Left;Can reach both Entrance Stairs-Number of Steps: 3   Home Layout: Two level;Able to live on main level with bedroom/bathroom Home Equipment: Grab bars - toilet;Grab bars - tub/shower;Hand held shower  head;Shower seat;BSC/3in1;Rolling Walker (2 wheels);Cane - single point Additional Comments: son and dtr gets together and every couple weeks. They call 2-3 times a week. Housekeeper every 3 weeks.    Prior Function Prior Level of Function : Independent/Modified Independent;Driving             Mobility Comments:  Uses cane at times when out       Hand Dominance   Dominant Hand: Left    Extremity/Trunk Assessment   Upper Extremity Assessment Upper Extremity Assessment: Defer to OT evaluation    Lower Extremity Assessment Lower Extremity Assessment: Generalized weakness       Communication   Communication: HOH (hearing aides)  Cognition Arousal/Alertness: Awake/alert Behavior During Therapy: WFL for tasks assessed/performed Overall Cognitive Status: Within Functional Limits for tasks assessed                                          General Comments General comments (skin integrity, edema, etc.): HR to 120's with amb    Exercises     Assessment/Plan    PT Assessment Patient needs continued PT services  PT Problem List Decreased strength;Decreased balance;Decreased mobility       PT Treatment Interventions DME instruction;Functional mobility training;Balance training;Patient/family education;Gait training;Therapeutic activities;Therapeutic exercise;Stair training    PT Goals (Current goals can be found in the Care Plan section)  Acute Rehab PT Goals Patient Stated Goal: return home PT Goal Formulation: With patient Time For Goal Achievement: 11/15/21 Potential to Achieve Goals: Good    Frequency Min 3X/week     Co-evaluation               AM-PAC PT "6 Clicks" Mobility  Outcome Measure Help needed turning from your back to your side while in a flat bed without using bedrails?: None Help needed moving from lying on your back to sitting on the side of a flat bed without using bedrails?: None Help needed moving to and from a bed to a chair (including a wheelchair)?: A Little Help needed standing up from a chair using your arms (e.g., wheelchair or bedside chair)?: A Little Help needed to walk in hospital room?: A Little Help needed climbing 3-5 steps with a railing? : A Little 6 Click Score: 20    End of Session Equipment Utilized During  Treatment: Gait belt Activity Tolerance: Patient tolerated treatment well Patient left: in chair;with call bell/phone within reach   PT Visit Diagnosis: Other abnormalities of gait and mobility (R26.89);Muscle weakness (generalized) (M62.81);History of falling (Z91.81)    Time: 6948-5462 PT Time Calculation (min) (ACUTE ONLY): 15 min   Charges:   PT Evaluation $PT Eval Low Complexity: [place] [facility] [place] 11/01/2021, 12:13 PM

## 2021-11-02 ENCOUNTER — Other Ambulatory Visit (HOSPITAL_COMMUNITY): Payer: Self-pay

## 2021-11-02 DIAGNOSIS — F32A Depression, unspecified: Secondary | ICD-10-CM | POA: Diagnosis present

## 2021-11-02 DIAGNOSIS — Z79899 Other long term (current) drug therapy: Secondary | ICD-10-CM | POA: Diagnosis not present

## 2021-11-02 DIAGNOSIS — Z87891 Personal history of nicotine dependence: Secondary | ICD-10-CM | POA: Diagnosis not present

## 2021-11-02 DIAGNOSIS — K219 Gastro-esophageal reflux disease without esophagitis: Secondary | ICD-10-CM | POA: Diagnosis present

## 2021-11-02 DIAGNOSIS — R338 Other retention of urine: Secondary | ICD-10-CM | POA: Diagnosis present

## 2021-11-02 DIAGNOSIS — N401 Enlarged prostate with lower urinary tract symptoms: Secondary | ICD-10-CM | POA: Diagnosis present

## 2021-11-02 DIAGNOSIS — Z20822 Contact with and (suspected) exposure to covid-19: Secondary | ICD-10-CM | POA: Diagnosis present

## 2021-11-02 DIAGNOSIS — Z981 Arthrodesis status: Secondary | ICD-10-CM | POA: Diagnosis not present

## 2021-11-02 DIAGNOSIS — Z7901 Long term (current) use of anticoagulants: Secondary | ICD-10-CM | POA: Diagnosis not present

## 2021-11-02 DIAGNOSIS — I5042 Chronic combined systolic (congestive) and diastolic (congestive) heart failure: Secondary | ICD-10-CM | POA: Diagnosis present

## 2021-11-02 DIAGNOSIS — Z66 Do not resuscitate: Secondary | ICD-10-CM | POA: Diagnosis present

## 2021-11-02 DIAGNOSIS — J189 Pneumonia, unspecified organism: Secondary | ICD-10-CM | POA: Diagnosis not present

## 2021-11-02 DIAGNOSIS — Z888 Allergy status to other drugs, medicaments and biological substances status: Secondary | ICD-10-CM | POA: Diagnosis not present

## 2021-11-02 DIAGNOSIS — N390 Urinary tract infection, site not specified: Secondary | ICD-10-CM | POA: Diagnosis present

## 2021-11-02 DIAGNOSIS — J9601 Acute respiratory failure with hypoxia: Secondary | ICD-10-CM | POA: Diagnosis not present

## 2021-11-02 DIAGNOSIS — R0602 Shortness of breath: Secondary | ICD-10-CM | POA: Diagnosis present

## 2021-11-02 DIAGNOSIS — I4819 Other persistent atrial fibrillation: Secondary | ICD-10-CM | POA: Diagnosis present

## 2021-11-02 DIAGNOSIS — G2581 Restless legs syndrome: Secondary | ICD-10-CM | POA: Diagnosis present

## 2021-11-02 DIAGNOSIS — G9341 Metabolic encephalopathy: Secondary | ICD-10-CM | POA: Diagnosis present

## 2021-11-02 DIAGNOSIS — I959 Hypotension, unspecified: Secondary | ICD-10-CM | POA: Diagnosis present

## 2021-11-02 DIAGNOSIS — Z7984 Long term (current) use of oral hypoglycemic drugs: Secondary | ICD-10-CM | POA: Diagnosis not present

## 2021-11-02 DIAGNOSIS — G629 Polyneuropathy, unspecified: Secondary | ICD-10-CM | POA: Diagnosis present

## 2021-11-02 DIAGNOSIS — N179 Acute kidney failure, unspecified: Secondary | ICD-10-CM | POA: Diagnosis present

## 2021-11-02 DIAGNOSIS — J690 Pneumonitis due to inhalation of food and vomit: Secondary | ICD-10-CM | POA: Diagnosis present

## 2021-11-02 DIAGNOSIS — Z825 Family history of asthma and other chronic lower respiratory diseases: Secondary | ICD-10-CM | POA: Diagnosis not present

## 2021-11-02 DIAGNOSIS — J9621 Acute and chronic respiratory failure with hypoxia: Secondary | ICD-10-CM | POA: Diagnosis present

## 2021-11-02 DIAGNOSIS — Z823 Family history of stroke: Secondary | ICD-10-CM | POA: Diagnosis not present

## 2021-11-02 LAB — BASIC METABOLIC PANEL
Anion gap: 9 (ref 5–15)
BUN: 10 mg/dL (ref 8–23)
CO2: 26 mmol/L (ref 22–32)
Calcium: 8.5 mg/dL — ABNORMAL LOW (ref 8.9–10.3)
Chloride: 106 mmol/L (ref 98–111)
Creatinine, Ser: 1.38 mg/dL — ABNORMAL HIGH (ref 0.61–1.24)
GFR, Estimated: 52 mL/min — ABNORMAL LOW (ref >60)
Glucose, Bld: 103 mg/dL — ABNORMAL HIGH (ref 70–99)
Potassium: 3.6 mmol/L (ref 3.5–5.1)
Sodium: 141 mmol/L (ref 135–145)

## 2021-11-02 LAB — CBC
HCT: 30.2 % — ABNORMAL LOW (ref 39.0–52.0)
Hemoglobin: 10 g/dL — ABNORMAL LOW (ref 13.0–17.0)
MCH: 30.9 pg (ref 26.0–34.0)
MCHC: 33.1 g/dL (ref 30.0–36.0)
MCV: 93.2 fL (ref 80.0–100.0)
Platelets: 112 10*3/uL — ABNORMAL LOW (ref 150–400)
RBC: 3.24 MIL/uL — ABNORMAL LOW (ref 4.22–5.81)
RDW: 13.4 % (ref 11.5–15.5)
WBC: 4.5 10*3/uL (ref 4.0–10.5)
nRBC: 0 % (ref 0.0–0.2)

## 2021-11-02 MED ORDER — AMOXICILLIN-POT CLAVULANATE 875-125 MG PO TABS
1.0000 | ORAL_TABLET | Freq: Two times a day (BID) | ORAL | 0 refills | Status: AC
  Filled 2021-11-02: qty 8, 4d supply, fill #0

## 2021-11-02 MED ORDER — POTASSIUM CHLORIDE CRYS ER 10 MEQ PO TBCR
40.0000 meq | EXTENDED_RELEASE_TABLET | Freq: Once | ORAL | Status: AC
  Administered 2021-11-02: 40 meq via ORAL
  Filled 2021-11-02: qty 4

## 2021-11-02 MED ORDER — AMOXICILLIN-POT CLAVULANATE 875-125 MG PO TABS: 1.0000 | ORAL_TABLET | Freq: Two times a day (BID) | ORAL | Status: DC

## 2021-11-02 MED ORDER — GABAPENTIN 300 MG PO CAPS: 600.0000 mg | ORAL_CAPSULE | Freq: Three times a day (TID) | ORAL | Status: DC

## 2021-11-02 MED ORDER — SODIUM CHLORIDE 0.9 % IV SOLN: INTRAVENOUS | Status: DC | PRN

## 2021-11-02 NOTE — TOC Progression Note (Addendum)
Transition of Care [facility]) - Progression Note    Patient Details  Name: VUK SKILLERN MRN: [id number] Date of Birth: 06-12-1940  [facility] CM/SW Contact  Zenon Mayo, RN Phone Number: 11/02/2021, 10:54 AM  Clinical Narrative:    NCM spoke with patient at the bedside, offered choice for HHPT, HHOT, he chose [facility], NCM made referral to [facility], she states she will check the [place] office. Patient states he has rolling walker, bsc, cane, crutches at home.  He still drives to his [place] apts.  Goes to [facility]  in [place].  His son and his son's wife will stay with him for a couple days to see how he is going to do at dc.  Caryl Pina states the [place] office can take referral with a delayed soc date of 9/18.          Expected Discharge Plan and Services                                                 Social Determinants of Health (SDOH) Interventions    Readmission Risk Interventions     No data to display

## 2021-11-02 NOTE — Progress Notes (Signed)
Pt has orders to be discharged. Discharge instructions given and pt has no additional questions at this time. Medication regimen reviewed and pt educated. Pt verbalized understanding and has no additional questions. Telemetry box removed. IV removed and site in good condition. Pt stable and waiting for transportation. 

## 2021-11-02 NOTE — TOC Transition Note (Addendum)
Transition of Care [facility]) - CM/SW Discharge Note   Patient Details  Name: Dustin [name] MRN: [id number] Date of Birth: 03/15/1940  Transition of Care [facility]) CM/SW Contact:  Zenon Mayo, RN Phone Number: 11/02/2021, 11:55 AM   Clinical Narrative:    NCM spoke with patient at the bedside, offered choice for [place], [place], with Medicare.gov list, he chose [facility], NCM made referral to [facility], she states she will check the [place] office. Patient states he has rolling walker, bsc, cane, crutches at home.  He still drives to his [place] apts.  Goes to [facility]  in [place].  His son and his son's wife will stay with him for a couple days to see how he is going to do at dc.  Caryl Pina states the [place] office can take referral with a delayed soc date of 9/18.       Final next level of care: Home w Home Health Services Barriers to Discharge: No Barriers Identified   Patient Goals and CMS Choice Patient states their goals for this hospitalization and ongoing recovery are:: return home , son and wife will be with him CMS Medicare.gov Compare Post Acute Care list provided to:: Patient Choice offered to / list presented to : Patient  Discharge Placement                       Discharge Plan and Services                  DME Agency: NA       HH Arranged: PT, OT [place] Agency: [place] ([facility]) Date [place]: 11/02/21 Time Ramos: 0109 Representative spoke with at [place]: [place] ([place]) Interventions     Readmission Risk Interventions     No data to display

## 2021-11-02 NOTE — Progress Notes (Signed)
   11/02/21 1200  Mobility  Activity Ambulated with assistance in hallway  Level of Assistance Standby assist, set-up cues, supervision of patient - no hands on  Assistive Device Front wheel walker  Distance Ambulated (ft) 240 ft  Activity Response Tolerated well  $Mobility charge 1 Mobility   Mobility Specialist Progress Note  Received pt in bed having no complaints and agreeable to mobility. Pt was asymptomatic throughout ambulation and returned to room w/o fault. Left in bed w/ call bell in reach and all needs met.   Lucious Groves Mobility Specialist

## 2021-11-02 NOTE — Discharge Summary (Signed)
Physician Discharge Summary  Dustin [name] NTI:[id number] DOB: 02/28/1940 DOA: 10/30/2021  PCP: Lujean Amel, MD  Admit date: 10/30/2021 Discharge date: 11/02/2021  Time spent: 55 minutes  Recommendations for Outpatient Follow-up:  Follow-up with Lujean Amel, MD on 11/09/2021 at 12:15 PM.  On follow-up patient will need basic metabolic profile done to follow-up on electrolytes and renal function.  Urinary retention will need to be reassessed on follow-up, patient passed voiding trial on day of discharge.  Urine cultures will need to be followed up upon as there were pending on day of discharge. Patient with discharge home with home health therapy.   Discharge Diagnoses:  Principal Problem:   Acute respiratory failure with hypoxia (HCC) Active Problems:   Pneumonia due to infectious organism   Urinary tract infection without hematuria   Peripheral axonal neuropathy   Memory loss   Depression   GERD (gastroesophageal reflux disease)   BPH (benign prostatic hyperplasia)   Chronic combined systolic (congestive) and diastolic (congestive) heart failure (HCC)   Restless leg syndrome   Persistent atrial fibrillation ([facility])   Discharge Condition: Stable and improved.  Diet recommendation: Heart healthy  Filed Weights   10/31/21 0117 11/01/21 0116 11/02/21 0525  Weight: 100.4 kg 100.4 kg 100.1 kg    History of present illness:  HPI per Dr. Sundra Aland is a 81 y.o. male with medical history significant of BPH, neuropathy, memory loss, depression, GERD, systolic and diastolic heart failure, RLS, A-fib, ataxia, falls presenting after being found down at home.   History provided with assistance of family.  Yesterday family noted patient to be weak and have some intermittent confusion as well as some slurred speech when he was out grocery shopping.  They encouraged him to come to the ED for evaluation yesterday but he declined at that time.   Today patient was found down  by a housekeeper at his home with labored breathing.  EMS was called and on their arrival patient was noted to be in A-fib with RVR, hypoxic and hypotensive.  He was placed on oxygen and received IV fluids and was transported to the ED.   Reports chronic intermittently productive cough. Patient now alert and denies fevers, chills, chest pain, shortness of breath, abdominal pain, constipation, diarrhea, nausea, vomiting.   ED Course: Vital signs in the ED significant for blood pressure as low as the 86P systolic but largely in the 619J to 093O systolic.  Also noted to be requiring 2 to 3 L still on oxygen to maintain saturations.  Lab work-up included CMP with creatinine elevated to 1.96 and baseline 1.2, glucose 131, calcium 8.2, protein 6.3.  CBC with hemoglobin stable at 11.7 and platelets of 128.  Lactic acid normal.  Troponin flat at 51x2.  BNP mildly elevated at 390.  Rester panel for flu and COVID-negative.  Urinalysis with hemoglobin, nitrates, leukocytes, bacteria.  Urine culture and blood culture pending.  VBG with normal pH and PCO2.  Imaging work-up included chest x-ray with no acute normality, CT head no acute abnormality, CTA PE study negative for PE but did show new groundglass opacities at bilateral bases suspicious for aspiration with pulmonary edema being less likely.  MR brain has been ordered and is pending.  Patient received vancomycin, cefepime, 2 L in the ED.  Hospital Course:  #1 acute on chronic respiratory failure with hypoxia/aspiration pneumonia versus pulmonary edema -Patient noted to be found down when EMS was called initially noted to be hypotensive in A-fib with  RVR and hypoxic. -Hypoxia improved during the hospitalization and patient was 98% on room air by day of discharge.  -CT angiogram chest negative for PE however concerning for new groundglass opacities at the bilateral lower lobes consistent with aspiration event.  BNP noted to be mildly elevated at 390, troponin is  flat at 51x2. -Hypoxia felt to be likely secondary to aspiration event. -SLP evaluated. -Patient noted to have received IV Vanco and cefepime in the ED and placed on IV Rocephin during the hospitalization.   -Patient subsequently transition from IV Rocephin to oral Augmentin for 4 more days to complete a course of antibiotic treatment on discharge.   -Patient improved clinically and was stable by day of discharge.  -Outpatient follow-up with PCP.     2.  UTI -Urine cultures pending by day of discharge. -Patient received 3 days of IV Rocephin during the hospitalization and will be transition to Augmentin on discharge secondary to problem #1. -Outpatient follow-up.   3.  Acute metabolic encephalopathy -Likely secondary to acute infection of UTI and aspiration pneumonia, urine cultures pending at time of discharge. -Patient noted to have received vancomycin and cefepime in the ED and transitioned to IV Rocephin. -Patient was maintained on IV Rocephin and will be discharged home on Augmentin to complete a course of antibiotic treatment. -Acute metabolic encephalopathy had resolved by day of discharge.   4.  Urinary retention -Per RN bladder scan noted approximately at 1000 cc of urine noted.  Patient with history of BPH with complaints of suprapubic abdominal discomfort. -?  May have been secondary to UTI. -Foley catheter placed the morning of 10/31/2021 with output of 1.1 L right after Foley catheter was placed. -Patient continued to have good urine output.   -Voiding trial done on day of discharge with a PVR of 0 cc.   -Patient was discharged home in stable and improved condition, outpatient follow-up with PCP.   -If patient with further evidence of urinary retention will need outpatient follow-up with his primary urologist, Dr. Diona Fanti.  -Patient was maintained on home regimen of Flomax.    5.  AKI -Patient noted to have a creatinine elevated 1.96 from baseline of 1.2. -Patient noted to  be hypotensive in the ED received IV fluids. -Patient noted to have elevated BNP however felt likely to transient A-fib. -Patient also noted with urinary retention and Foley catheter placed. -Patient with good urine output, renal function improved such that by day of discharge creatinine was down to 1.38 from 1.96 on admission.   -Outpatient follow-up with PCP.   6.  Persistent atrial fibrillation with transient RVR/transient hypotension -Patient noted to be in A-fib with RVR with hypotension when initially found by EMS which improved with IV fluids in the ED felt likely triggered by aspiration event. -Patient maintained on metoprolol for rate control during the hospitalization and Eliquis for anticoagulation.   -Outpatient follow-up.   7.  Chronic combined systolic diastolic CHF -2D echo from 2022 with EF of 50 to 55%, indeterminate diastolic dysfunction, normal RV function. -Not on diuretics currently on metoprolol and farixga. -Patient noted to have mildly elevated BNP in the ED felt secondary to A-fib with RVR. -Patient maintained on metoprolol during the hospitalization, farixga was held during the hospitalization due to AKI and will be resumed on discharge.   -Outpatient follow-up with PCP.   8.  GERD -Patient maintained on PPI.   9.  BPH -Patient maintained on tamsulosin.   10.  Neuropathy -Gabapentin renally dose adjusted,  and patient be discharged home on 600 mg 3 times daily.   11.  Depression/memory loss -Patient maintained on home regimen duloxetine and Seroquel.  -Outpatient follow-up.      Procedures: CT head 10/30/2021 CT angiogram chest 10/30/2021 Chest x-ray 10/30/2021 MRI brain 10/30/2021  Consultations: None  Discharge Exam: Vitals:   11/02/21 0712 11/02/21 1117  BP: 135/83 131/78  Pulse: 79 72  Resp: 11 12  Temp: (!) 97.5 F (36.4 C) 97.8 F (36.6 C)  SpO2: 93% 98%    General: NAD Cardiovascular: RRR no m/r/g Respiratory: CTAB no wheezes, no  crackles, no rhonchi.  Fair air movement.  Speaking in full sentences.  Discharge Instructions   Discharge Instructions     Diet - low sodium heart healthy   Complete by: As directed    Increase activity slowly   Complete by: As directed       Allergies as of 11/02/2021       Reactions   Desmopressin Acetate Other (See Comments)   Unknown - Patient denies   Zoloft [sertraline] Other (See Comments)   Felt bad - Patient denies        Medication List     TAKE these medications    amoxicillin-clavulanate 875-125 MG tablet Commonly known as: AUGMENTIN Take 1 tablet by mouth every 12 (twelve) hours for 4 days. Start taking on: November 03, 2021   apixaban 5 MG Tabs tablet Commonly known as: ELIQUIS Take 5 mg by mouth 2 (two) times daily.   clotrimazole-betamethasone cream Commonly known as: LOTRISONE Apply 1 Application topically 2 (two) times daily.   Denta 5000 Plus 1.1 % Crea dental cream Generic drug: sodium fluoride Place 1 Application onto teeth 2 (two) times daily.   DULoxetine 60 MG capsule Commonly known as: CYMBALTA Take 60 mg by mouth daily.   Farxiga 10 MG Tabs tablet Generic drug: dapagliflozin propanediol Take 10 mg by mouth daily.   gabapentin 300 MG capsule Commonly known as: NEURONTIN Take 2 capsules (600 mg total) by mouth 3 (three) times daily. 600 mg 4 times a day, and 1200 mg at bedtime. What changed:  how much to take when to take this   metoprolol tartrate 25 MG tablet Commonly known as: LOPRESSOR Take 25 mg by mouth 2 (two) times daily.   multivitamin with minerals Tabs tablet Take 1 tablet by mouth daily.   naloxone 4 MG/0.1ML Liqd nasal spray kit Commonly known as: NARCAN SMARTSIG:Both Nares   oxyCODONE 15 MG immediate release tablet Commonly known as: ROXICODONE Take 15 mg by mouth 4 (four) times daily as needed for pain.   pantoprazole 40 MG tablet Commonly known as: PROTONIX Take 1 tablet (40 mg total) by mouth  daily.   QUEtiapine 300 MG tablet Commonly known as: SEROQUEL Take 600 mg by mouth at bedtime.   rOPINIRole 0.25 MG tablet Commonly known as: REQUIP Take 0.25 mg by mouth as needed (restless leg).   silodosin 8 MG Caps capsule Commonly known as: RAPAFLO Take 8 mg by mouth daily.   Vitamin D (Ergocalciferol) 1.25 MG (50000 UNIT) Caps capsule Commonly known as: DRISDOL Take 50,000 Units by mouth once a week. Thursdays       Allergies  Allergen Reactions   Desmopressin Acetate Other (See Comments)    Unknown - Patient denies   Zoloft [Sertraline] Other (See Comments)    Felt bad - Patient denies    Follow-up Information     Koirala, Dibas, MD. Go on 11/09/2021.  Specialty: Family Medicine Why: _0 :15pm Contact information: [address] [place] [address] [facility].   Why: [phone], Agency will contact you with apt times                 The results of significant diagnostics from this hospitalization (including imaging, microbiology, ancillary and laboratory) are listed below for reference.    Significant Diagnostic Studies: MR BRAIN WO CONTRAST  Result Date: 10/30/2021 CLINICAL DATA:  Dizziness EXAM: MRI HEAD WITHOUT CONTRAST TECHNIQUE: Multiplanar, multiecho pulse sequences of the brain and surrounding structures were obtained without intravenous contrast. COMPARISON:  None Available. FINDINGS: Brain: No acute infarct, mass effect or extra-axial collection. No acute or chronic hemorrhage. There is multifocal hyperintense T2-weighted signal within the white matter. Parenchymal volume and CSF spaces are normal. The midline structures are normal. Vascular: Major flow voids are preserved. Skull and upper cervical spine: Normal calvarium and skull base. Visualized upper cervical spine and soft tissues are normal. Sinuses/Orbits:No paranasal sinus fluid levels or advanced mucosal thickening. No  mastoid or middle ear effusion. Normal orbits. IMPRESSION: 1. No acute intracranial abnormality. 2. Findings of chronic small vessel ischemia. Electronically Signed   By: Ulyses Jarred M.D.   On: 10/30/2021 22:28   CT Angio Chest PE W and/or Wo Contrast  Result Date: 10/30/2021 CLINICAL DATA:  Stroke-like symptoms (ataxia, no focal deficits). Found down earlier today with cyanosis and hypoxia. Pulmonary embolism suspected, high probability. EXAM: CT ANGIOGRAPHY CHEST WITH CONTRAST TECHNIQUE: Multidetector CT imaging of the chest was performed using the standard protocol during bolus administration of intravenous contrast. Multiplanar CT image reconstructions and MIPs were obtained to evaluate the vascular anatomy. RADIATION DOSE REDUCTION: This exam was performed according to the departmental dose-optimization program which includes automated exposure control, adjustment of the mA and/or kV according to patient size and/or use of iterative reconstruction technique. CONTRAST:  72m OMNIPAQUE IOHEXOL 350 MG/ML SOLN COMPARISON:  Chest radiographs 10/30/2021 and 03/07/2020. No prior relevant CT. FINDINGS: Cardiovascular: The pulmonary arteries are well opacified with contrast to the level of the subsegmental branches. There is no evidence of acute pulmonary embolism. Atherosclerosis of the aorta, great vessels and coronary arteries without evidence of acute systemic arterial abnormality. The heart size is normal. There is no pericardial effusion. Mediastinum/Nodes: There are no enlarged mediastinal, hilar or axillary lymph nodes.There are small mediastinal and hilar lymph nodes. Possible wall thickening of the midesophagus, suboptimally evaluated. The distal esophagus appears unremarkable. No abnormality of the thyroid gland or trachea seen. Lungs/Pleura: No pleural effusion or pneumothorax. Moderate centrilobular and paraseptal emphysema with interval development (since earlier radiographs) of patchy ground-glass  opacities dependently in both lungs, suspicious for possible aspiration. Upper abdomen: Reflux of contrast into the IVC and hepatic veins. No acute findings. Musculoskeletal/Chest wall: There is no chest wall mass or suspicious osseous finding. Mild thoracic spondylosis. Previous cervical fusion. Review of the MIP images confirms the above findings. IMPRESSION: 1. No evidence of acute pulmonary embolism or other acute vascular findings in the chest. 2. Since earlier chest radiographs, the patient has developed dependent ground-glass opacities in both lungs, suspicious for aspiration. Pulmonary edema considered less likely, although there is reflux of contrast into the IVC and hepatic veins. Radiographic follow up recommended. 3. Coronary and aortic atherosclerosis (ICD10-I70.0). Emphysema (ICD10-J43.9). Electronically Signed   By: WRichardean SaleM.D.   On: 10/30/2021 18:29   CT HEAD WO CONTRAST (  5MM)  Result Date: 10/30/2021 CLINICAL DATA:  Head trauma, minor (Age >= 65y) EXAM: CT HEAD WITHOUT CONTRAST TECHNIQUE: Contiguous axial images were obtained from the base of the skull through the vertex without intravenous contrast. RADIATION DOSE REDUCTION: This exam was performed according to the departmental dose-optimization program which includes automated exposure control, adjustment of the mA and/or kV according to patient size and/or use of iterative reconstruction technique. COMPARISON:  03/07/2020 FINDINGS: Brain: No acute intracranial abnormality. Specifically, no hemorrhage, hydrocephalus, mass lesion, acute infarction, or significant intracranial injury. Vascular: No hyperdense vessel or unexpected calcification. Skull: No acute calvarial abnormality. Sinuses/Orbits: No acute findings Other: None IMPRESSION: No acute intracranial abnormality. Electronically Signed   By: Rolm Baptise M.D.   On: 10/30/2021 18:21   DG Chest Port 1 View  Result Date: 10/30/2021 CLINICAL DATA:  Fever EXAM: PORTABLE CHEST 1  VIEW COMPARISON:  03/07/2020, 09/30/2019 FINDINGS: Hardware in the cervical spine. No acute airspace disease or effusion. Stable cardiomediastinal silhouette. No pneumothorax. IMPRESSION: No active disease. Electronically Signed   By: Donavan Foil M.D.   On: 10/30/2021 16:27    Microbiology: Recent Results (from the past 240 hour(s))  Culture, blood (routine x 2)     Status: None (Preliminary result)   Collection Time: 10/30/21  4:09 PM   Specimen: BLOOD  Result Value Ref Range Status   Specimen Description BLOOD RIGHT ANTECUBITAL  Final   Special Requests   Final    BOTTLES DRAWN AEROBIC AND ANAEROBIC Blood Culture adequate volume   Culture   Final    NO GROWTH 3 DAYS Performed at [facility], [address] [place], [place] [address]    Report Status PENDING  Incomplete  Resp Panel by RT-PCR (Flu A&B, Covid) Anterior Nasal Swab     Status: None   Collection Time: 10/30/21  4:09 PM   Specimen: Anterior Nasal Swab  Result Value Ref Range Status   SARS Coronavirus 2 by RT PCR NEGATIVE NEGATIVE Final    Comment: (NOTE) SARS-CoV-2 target nucleic acids are NOT DETECTED.  The SARS-CoV-2 RNA is generally detectable in upper respiratory specimens during the acute phase of infection. The lowest concentration of SARS-CoV-2 viral copies this assay can detect is 138 copies/mL. A negative result does not preclude SARS-Cov-2 infection and should not be used as the sole basis for treatment or other patient management decisions. A negative result may occur with  improper specimen collection/handling, submission of specimen other than nasopharyngeal swab, presence of viral mutation(s) within the areas targeted by this assay, and inadequate number of viral copies(<138 copies/mL). A negative result must be combined with clinical observations, patient history, and epidemiological information. The expected result is Negative.  Fact Sheet for Patients:   EntrepreneurPulse.com.au  Fact Sheet for Healthcare Providers:  IncredibleEmployment.be  This test is no t yet approved or cleared by the [place] FDA and  has been authorized for detection and/or diagnosis of SARS-CoV-2 by FDA under an Emergency Use Authorization (EUA). This EUA will remain  in effect (meaning this test can be used) for the duration of the COVID-19 declaration under Section 564(b)(1) of the Act, 21 U.S.C.section 360bbb-3(b)(1), unless the authorization is terminated  or revoked sooner.       Influenza A by PCR NEGATIVE NEGATIVE Final   Influenza B by PCR NEGATIVE NEGATIVE Final    Comment: (NOTE) The Xpert Xpress SARS-CoV-2/FLU/RSV plus assay is intended as an aid in the diagnosis of influenza from Nasopharyngeal swab specimens and should  not be used as a sole basis for treatment. Nasal washings and aspirates are unacceptable for Xpert Xpress SARS-CoV-2/FLU/RSV testing.  Fact Sheet for Patients: EntrepreneurPulse.com.au  Fact Sheet for Healthcare Providers: IncredibleEmployment.be  This test is not yet approved or cleared by the [place] FDA and has been authorized for detection and/or diagnosis of SARS-CoV-2 by FDA under an Emergency Use Authorization (EUA). This EUA will remain in effect (meaning this test can be used) for the duration of the COVID-19 declaration under Section 564(b)(1) of the Act, 21 U.S.C. section 360bbb-3(b)(1), unless the authorization is terminated or revoked.  Performed at [facility], [place] [address], [place], [place] [address]   Culture, blood (routine x 2)     Status: None (Preliminary result)   Collection Time: 10/30/21  4:10 PM   Specimen: BLOOD  Result Value Ref Range Status   Specimen Description BLOOD BLOOD RIGHT WRIST  Final   Special Requests   Final    BOTTLES DRAWN AEROBIC AND ANAEROBIC Blood Culture adequate volume   Culture    Final    NO GROWTH 3 DAYS Performed at [facility], [place] [address], [place], [place] [address]    Report Status PENDING  Incomplete     Labs: Basic Metabolic Panel: Recent Labs  Lab 10/30/21 1605 10/30/21 1609 10/30/21 1924 10/31/21 0449 11/01/21 0336 11/02/21 0357  NA 137 137  137  --  139 140 141  K 4.0 4.0  4.0  --  4.2 3.6 3.6  CL 102 99  --  106 107 106  CO2 27  --   --  _0 GLUCOSE 131* 124*  --  103* 101* 103*  BUN 18 18  --  _1 CREATININE 1.96* 1.90*  --  1.63* 1.43* 1.38*  CALCIUM 8.2*  --   --  8.0* 8.1* 8.5*  MG  --   --  1.8  --  1.9  --    Liver Function Tests: Recent Labs  Lab 10/30/21 1605 10/31/21 0449  AST 27 25  ALT 12 10  ALKPHOS 80 65  BILITOT 1.1 1.5*  PROT 6.3* 5.3*  ALBUMIN 3.5 2.9*   No results for input(s): "LIPASE", "AMYLASE" in the last 168 hours. No results for input(s): "AMMONIA" in the last 168 hours. CBC: Recent Labs  Lab 10/30/21 1605 10/30/21 1609 10/31/21 0449 11/01/21 0336 11/02/21 0357  WBC 9.5  --  6.7 5.0 4.5  NEUTROABS 7.4  --   --  3.0  --   HGB 11.7* 11.9*  11.9* 9.9* 10.3* 10.0*  HCT 35.9* 35.0*  35.0* 29.8* 30.7* 30.2*  MCV 95.5  --  94.9 92.7 93.2  PLT 128*  --  113* 102* 112*   Cardiac Enzymes: No results for input(s): "CKTOTAL", "CKMB", "CKMBINDEX", "TROPONINI" in the last 168 hours. BNP: BNP (last 3 results) Recent Labs    10/30/21 1605  BNP 390.4*    ProBNP (last 3 results) No results for input(s): "PROBNP" in the last 8760 hours.  CBG: No results for input(s): "GLUCAP" in the last 168 hours.     Signed:  Irine Seal MD.  [facility] 11/02/2021, 3:28 PM

## 2021-11-02 NOTE — Progress Notes (Signed)
Occupational Therapy Discharge Patient Details Name: Dustin [name] MRN: [id number] DOB: [birth year] Today's Date: 11/02/2021 Time: 4255-2589 OT Time Calculation (min): 5 min  Patient discharged from OT services secondary to goals met and no further OT needs identified.  Pt reports that he is going home today. He is back to baseline and is able to complete all ADL tasks independently. He is walking in the hallway anywhere between 200-400 feet with mobility tech. Balance has improved and no deficits note. All therapy goals have been met.   Progress and discharge plan discussed with patient and/or caregiver: Patient/Caregiver agrees with plan    Ailene Ravel, OTR/L,CBIS  Supplemental OT - [facility]  11/02/2021, 2:35 PM

## 2021-11-04 LAB — CULTURE, BLOOD (ROUTINE X 2)
Culture: NO GROWTH
Culture: NO GROWTH
Special Requests: ADEQUATE
Special Requests: ADEQUATE

## 2021-12-10 ENCOUNTER — Ambulatory Visit: Payer: Medicare Other | Attending: Cardiovascular Disease | Admitting: Cardiovascular Disease

## 2021-12-10 ENCOUNTER — Encounter: Payer: Self-pay | Admitting: Cardiovascular Disease

## 2021-12-10 VITALS — BP 118/70 | HR 79 | Ht 74.0 in | Wt 215.2 lb

## 2021-12-10 DIAGNOSIS — I519 Heart disease, unspecified: Secondary | ICD-10-CM

## 2021-12-10 DIAGNOSIS — I4819 Other persistent atrial fibrillation: Secondary | ICD-10-CM | POA: Diagnosis not present

## 2021-12-10 NOTE — Assessment & Plan Note (Signed)
History of persistent A-fib rate controlled on Eliquis oral anticoagulation. 

## 2021-12-10 NOTE — Progress Notes (Signed)
12/10/2021 Dustin [name]   Mar 14, 1940  [id number]  Primary Physician Lujean Amel, MD Primary Cardiologist: Lorretta Harp MD [name], [name]  HPI:  Dustin [name] is a 81 y.o.  mildly overweight widowed Caucasian male father of 81, grandfather of 2 grandchildren referred by Dr.Koirala, his PCP, for newly recognized A. fib.  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  Following that, he moved to the [place] area and managed rental property for 30 years.  He is currently completely retired and does woodworking in his backyard.  He has no cardiac risk factors.  There is no family history.  He is never had a heart attack or stroke.  He denies chest pain or shortness of breath.  He is currently the caregiver for his wife.  Does have peripheral neuropathy.  He was found to be in A. fib with RVR by his PCP and begun on Eliquis oral anticoagulation .  Since I saw him a year ago he did have a failed DC cardioversion by Dr. Gardiner Rhyme  and quickly reverted back to atrial fibrillation with controlled ventricular response.  Unfortunately, his wife of 74 years passed away in 2022/04/22.  He currently lives alone and is independent.  He is asymptomatic specifically denying chest pain or shortness of breath.     Current Meds  Medication Sig   apixaban (ELIQUIS) 5 MG TABS tablet Take 5 mg by mouth 2 (two) times daily.   clotrimazole-betamethasone (LOTRISONE) cream Apply 1 Application topically 2 (two) times daily.   DENTA 5000 PLUS 1.1 % CREA dental cream Place 1 Application onto teeth 2 (two) times daily.   FARXIGA 10 MG TABS tablet Take 10 mg by mouth daily.   gabapentin (NEURONTIN) 300 MG capsule Take 2 capsules (600 mg total) by mouth 3 (three) times daily. 600 mg 4 times a day, and 1200 mg at bedtime.   metoprolol tartrate (LOPRESSOR) 25 MG tablet Take 25 mg by mouth 2 (two) times daily.   Multiple Vitamin (MULTIVITAMIN WITH MINERALS) TABS tablet Take 1  tablet by mouth daily.   oxyCODONE (ROXICODONE) 15 MG immediate release tablet Take 15 mg by mouth 4 (four) times daily as needed for pain.   pantoprazole (PROTONIX) 40 MG tablet Take 1 tablet (40 mg total) by mouth daily.   QUEtiapine (SEROQUEL) 300 MG tablet Take 600 mg by mouth at bedtime.   Vitamin D, Ergocalciferol, (DRISDOL) 1.25 MG (50000 UNIT) CAPS capsule Take 50,000 Units by mouth once a week. Thursdays     Allergies  Allergen Reactions   Desmopressin Acetate Other (See Comments)    Unknown - Patient denies   Zoloft [Sertraline] Other (See Comments)    Felt bad - Patient denies    Social History   Socioeconomic History   Marital status: Married    Spouse name: [name]   Number of children: 2   Years of education: college   Highest education level: Not on file  Occupational History   Occupation: retired  Tobacco Use   Smoking status: Former    Packs/day: 1.50    Types: Cigarettes    Quit date: 01/27/1990    Years since quitting: 31.8   Smokeless tobacco: Never  Vaping Use   Vaping Use: Never used  Substance and Sexual Activity   Alcohol use: No   Drug use: No   Sexual activity: Not on file  Other Topics Concern   Not on file  Social  History Narrative   [name]   Pt is retired   Veterinary surgeon    Pt is left handed   Pt consumes 2 cups of coffee daily   Social Determinants of Radio broadcast assistant Strain: Not on file  Food Insecurity: No Food Insecurity (10/31/2021)   Hunger Vital Sign    Worried About Running Out of Food in the Last Year: Never true    Ran Out of Food in the Last Year: Never true  Transportation Needs: Unmet Transportation Needs (10/31/2021)   PRAPARE - Hydrologist (Medical): Yes    Lack of Transportation (Non-Medical): Yes  Physical Activity: Not on file  Stress: Not on file  Social Connections: Not on file  Intimate Partner Violence: Not At Risk (10/31/2021)    Humiliation, Afraid, Rape, and Kick questionnaire    Fear of Current or Ex-Partner: No    Emotionally Abused: No    Physically Abused: No    Sexually Abused: No     Review of Systems: General: negative for chills, fever, night sweats or weight changes.  Cardiovascular: negative for chest pain, dyspnea on exertion, edema, orthopnea, palpitations, paroxysmal nocturnal dyspnea or shortness of breath Dermatological: negative for rash Respiratory: negative for cough or wheezing Urologic: negative for hematuria Abdominal: negative for nausea, vomiting, diarrhea, bright red blood per rectum, melena, or hematemesis Neurologic: negative for visual changes, syncope, or dizziness All other systems reviewed and are otherwise negative except as noted above.    Blood pressure 118/70, pulse 79, height '6\' 2"'$  (1.88 m), weight 215 lb 3.2 oz (97.6 kg), SpO2 92 %.  General appearance: alert and no distress Neck: no adenopathy, no carotid bruit, no JVD, supple, symmetrical, trachea midline, and thyroid not enlarged, symmetric, no tenderness/mass/nodules Lungs: clear to auscultation bilaterally Heart: irregularly irregular rhythm Extremities: extremities normal, atraumatic, no cyanosis or edema Pulses: 2+ and symmetric Skin: Skin color, texture, turgor normal. No rashes or lesions Neurologic: Grossly normal  EKG atrial fibrillation with a ventricular sponsor 79.  I personally reviewed this EKG.  ASSESSMENT AND PLAN:   Persistent atrial fibrillation (HCC) History of persistent A-fib rate controlled on Eliquis oral anticoagulation.     Lorretta Harp MD FACP,FACC,FAHA, [facility] 12/10/2021 8:50 AM

## 2021-12-10 NOTE — Patient Instructions (Signed)
Medication Instructions:  Your physician recommends that you continue on your current medications as directed. Please refer to the Current Medication list given to you today.  *If you need a refill on your cardiac medications before your next appointment, please call your pharmacy*   Follow-Up: At [facility], you and your health needs are our priority.  As part of our continuing mission to provide you with exceptional heart care, we have created designated Provider Care Teams.  These Care Teams include your primary Cardiologist (physician) and Advanced Practice Providers (APPs -  Physician Assistants and Nurse Practitioners) who all work together to provide you with the care you need, when you need it.  We recommend signing up for the patient portal called "MyChart".  Sign up information is provided on this After Visit Summary.  MyChart is used to connect with patients for Virtual Visits (Telemedicine).  Patients are able to view lab/test results, encounter notes, upcoming appointments, etc.  Non-urgent messages can be sent to your provider as well.   To learn more about what you can do with MyChart, go to [email or web address].    Your next appointment:   12 month(s)  The format for your next appointment:   In Person  Provider:   Jonathan Berry, MD   

## 2022-06-11 ENCOUNTER — Emergency Department (HOSPITAL_BASED_OUTPATIENT_CLINIC_OR_DEPARTMENT_OTHER): Payer: Medicare Other

## 2022-06-11 ENCOUNTER — Other Ambulatory Visit: Payer: Self-pay

## 2022-06-11 ENCOUNTER — Encounter (HOSPITAL_BASED_OUTPATIENT_CLINIC_OR_DEPARTMENT_OTHER): Payer: Self-pay

## 2022-06-11 ENCOUNTER — Emergency Department (HOSPITAL_BASED_OUTPATIENT_CLINIC_OR_DEPARTMENT_OTHER): Payer: Medicare Other | Admitting: Radiology

## 2022-06-11 ENCOUNTER — Inpatient Hospital Stay (HOSPITAL_BASED_OUTPATIENT_CLINIC_OR_DEPARTMENT_OTHER)
Admission: EM | Admit: 2022-06-11 | Discharge: 2022-06-13 | DRG: 193 | Disposition: A | Discharge status: Home Health | Payer: Medicare Other | Attending: Internal Medicine | Admitting: Internal Medicine

## 2022-06-11 DIAGNOSIS — Z7901 Long term (current) use of anticoagulants: Secondary | ICD-10-CM

## 2022-06-11 DIAGNOSIS — Z825 Family history of asthma and other chronic lower respiratory diseases: Secondary | ICD-10-CM

## 2022-06-11 DIAGNOSIS — Z79899 Other long term (current) drug therapy: Secondary | ICD-10-CM

## 2022-06-11 DIAGNOSIS — Z1152 Encounter for screening for COVID-19: Secondary | ICD-10-CM

## 2022-06-11 DIAGNOSIS — K8020 Calculus of gallbladder without cholecystitis without obstruction: Secondary | ICD-10-CM | POA: Diagnosis present

## 2022-06-11 DIAGNOSIS — Z66 Do not resuscitate: Secondary | ICD-10-CM | POA: Diagnosis not present

## 2022-06-11 DIAGNOSIS — I4821 Permanent atrial fibrillation: Secondary | ICD-10-CM | POA: Diagnosis present

## 2022-06-11 DIAGNOSIS — Z87891 Personal history of nicotine dependence: Secondary | ICD-10-CM | POA: Diagnosis not present

## 2022-06-11 DIAGNOSIS — F32A Depression, unspecified: Secondary | ICD-10-CM | POA: Diagnosis present

## 2022-06-11 DIAGNOSIS — R0902 Hypoxemia: Principal | ICD-10-CM

## 2022-06-11 DIAGNOSIS — Z7984 Long term (current) use of oral hypoglycemic drugs: Secondary | ICD-10-CM | POA: Diagnosis not present

## 2022-06-11 DIAGNOSIS — G629 Polyneuropathy, unspecified: Secondary | ICD-10-CM | POA: Diagnosis present

## 2022-06-11 DIAGNOSIS — G4700 Insomnia, unspecified: Secondary | ICD-10-CM | POA: Diagnosis present

## 2022-06-11 DIAGNOSIS — N400 Enlarged prostate without lower urinary tract symptoms: Secondary | ICD-10-CM | POA: Diagnosis present

## 2022-06-11 DIAGNOSIS — I7143 Infrarenal abdominal aortic aneurysm, without rupture: Secondary | ICD-10-CM | POA: Diagnosis present

## 2022-06-11 DIAGNOSIS — Z823 Family history of stroke: Secondary | ICD-10-CM | POA: Diagnosis not present

## 2022-06-11 DIAGNOSIS — J439 Emphysema, unspecified: Secondary | ICD-10-CM | POA: Diagnosis present

## 2022-06-11 DIAGNOSIS — R109 Unspecified abdominal pain: Secondary | ICD-10-CM

## 2022-06-11 DIAGNOSIS — J9601 Acute respiratory failure with hypoxia: Secondary | ICD-10-CM | POA: Diagnosis present

## 2022-06-11 DIAGNOSIS — I5042 Chronic combined systolic (congestive) and diastolic (congestive) heart failure: Secondary | ICD-10-CM | POA: Diagnosis present

## 2022-06-11 DIAGNOSIS — I4819 Other persistent atrial fibrillation: Secondary | ICD-10-CM | POA: Diagnosis not present

## 2022-06-11 DIAGNOSIS — Z981 Arthrodesis status: Secondary | ICD-10-CM | POA: Diagnosis not present

## 2022-06-11 DIAGNOSIS — G2581 Restless legs syndrome: Secondary | ICD-10-CM | POA: Diagnosis present

## 2022-06-11 DIAGNOSIS — I714 Abdominal aortic aneurysm, without rupture, unspecified: Secondary | ICD-10-CM

## 2022-06-11 DIAGNOSIS — Z888 Allergy status to other drugs, medicaments and biological substances status: Secondary | ICD-10-CM

## 2022-06-11 DIAGNOSIS — J189 Pneumonia, unspecified organism: Secondary | ICD-10-CM | POA: Diagnosis present

## 2022-06-11 DIAGNOSIS — K219 Gastro-esophageal reflux disease without esophagitis: Secondary | ICD-10-CM | POA: Diagnosis present

## 2022-06-11 LAB — URINALYSIS, ROUTINE W REFLEX MICROSCOPIC
Bacteria, UA: NONE SEEN
Bilirubin Urine: NEGATIVE
Glucose, UA: NEGATIVE mg/dL
Ketones, ur: NEGATIVE mg/dL
Leukocytes,Ua: NEGATIVE
Nitrite: NEGATIVE
Protein, ur: NEGATIVE mg/dL
Specific Gravity, Urine: 1.009 (ref 1.005–1.030)
pH: 6.5 (ref 5.0–8.0)

## 2022-06-11 LAB — COMPREHENSIVE METABOLIC PANEL
ALT: 12 U/L (ref 0–44)
AST: 35 U/L (ref 15–41)
Albumin: 4.2 g/dL (ref 3.5–5.0)
Alkaline Phosphatase: 74 U/L (ref 38–126)
Anion gap: 8 (ref 5–15)
BUN: 21 mg/dL (ref 8–23)
CO2: 29 mmol/L (ref 22–32)
Calcium: 9.1 mg/dL (ref 8.9–10.3)
Chloride: 97 mmol/L — ABNORMAL LOW (ref 98–111)
Creatinine, Ser: 1.76 mg/dL — ABNORMAL HIGH (ref 0.61–1.24)
GFR, Estimated: 39 mL/min — ABNORMAL LOW (ref >60)
Glucose, Bld: 118 mg/dL — ABNORMAL HIGH (ref 70–99)
Potassium: 4.5 mmol/L (ref 3.5–5.1)
Sodium: 134 mmol/L — ABNORMAL LOW (ref 135–145)
Total Bilirubin: 0.9 mg/dL (ref 0.3–1.2)
Total Protein: 7 g/dL (ref 6.5–8.1)

## 2022-06-11 LAB — RESP PANEL BY RT-PCR (RSV, FLU A&B, COVID)  RVPGX2
Influenza A by PCR: NEGATIVE
Influenza B by PCR: NEGATIVE
Resp Syncytial Virus by PCR: NEGATIVE
SARS Coronavirus 2 by RT PCR: NEGATIVE

## 2022-06-11 LAB — LIPASE, BLOOD: Lipase: <10 U/L — ABNORMAL LOW (ref 11–51)

## 2022-06-11 LAB — CBC
HCT: 34.6 % — ABNORMAL LOW (ref 39.0–52.0)
Hemoglobin: 11.2 g/dL — ABNORMAL LOW (ref 13.0–17.0)
MCH: 30.7 pg (ref 26.0–34.0)
MCHC: 32.4 g/dL (ref 30.0–36.0)
MCV: 94.8 fL (ref 80.0–100.0)
Platelets: 144 10*3/uL — ABNORMAL LOW (ref 150–400)
RBC: 3.65 MIL/uL — ABNORMAL LOW (ref 4.22–5.81)
RDW: 12.9 % (ref 11.5–15.5)
WBC: 7.3 10*3/uL (ref 4.0–10.5)
nRBC: 0 % (ref 0.0–0.2)

## 2022-06-11 MED ORDER — IOHEXOL 350 MG/ML SOLN
100.0000 mL | Freq: Once | INTRAVENOUS | Status: AC | PRN
  Administered 2022-06-11: 75 mL via INTRAVENOUS

## 2022-06-11 MED ORDER — LACTATED RINGERS IV BOLUS: 500.0000 mL | Freq: Once | INTRAVENOUS | Status: DC

## 2022-06-11 MED ORDER — SODIUM CHLORIDE 0.9 % IV BOLUS
1000.0000 mL | Freq: Once | INTRAVENOUS | Status: AC
  Administered 2022-06-11: 1000 mL via INTRAVENOUS

## 2022-06-11 MED ORDER — POTASSIUM CHLORIDE CRYS ER 20 MEQ PO TBCR: 40.0000 meq | EXTENDED_RELEASE_TABLET | Freq: Once | ORAL | Status: DC

## 2022-06-11 MED ORDER — SODIUM CHLORIDE 0.9 % IV SOLN
500.0000 mg | Freq: Once | INTRAVENOUS | Status: AC
  Administered 2022-06-11: 500 mg via INTRAVENOUS
  Filled 2022-06-11: qty 5

## 2022-06-11 MED ORDER — SODIUM CHLORIDE 0.9 % IV SOLN
1.0000 g | Freq: Once | INTRAVENOUS | Status: AC
  Administered 2022-06-11: 1 g via INTRAVENOUS
  Filled 2022-06-11: qty 10

## 2022-06-11 NOTE — ED Provider Notes (Signed)
[place] EMERGENCY DEPARTMENT AT [facility] Provider Note   CSN: [id number] Arrival date & time: 06/11/22  1154     History  Chief Complaint  Patient presents with   Abdominal Pain    Dustin [name] is a 82 y.o. male  with medical history of BPH, CHF, GERD, neuropathy, A-fib on Eliquis.  Patient presents to ED for evaluation of right upper quadrant pain.  Patient reports that he believes it is a gallbladder.  Patient states that for the last 2 days he has had upper quadrant colicky abdominal pain that is constant.  Patient denies alleviating or aggravating factors.  Patient reports he is still eating, ingesting food does not change the quality of his pain.  Patient denies nausea, vomiting, diarrhea, dysuria, flank pain.  Patient does report that his urine is "more orange" than usual.  Patient son adds that he believes the patient has had mini strokes because the patient fell asleep in front of the couch 2 days ago, woke up at 1:30 AM and had a hard time walking to his room.  The patient reports that he felt as if he was "walking through snow" on the way to his room.  Patient denies any feeling of one-sided weakness or numbness at present.  Patient also noted to be tachycardic and hypoxic on the monitor when I enter the room.  Patient denies chest pain or shortness of breath.  Denies calf pain.   Abdominal Pain Associated symptoms: no chest pain, no diarrhea, no dysuria, no nausea, no shortness of breath and no vomiting        Home Medications Prior to Admission medications   Medication Sig Start Date End Date Taking? Authorizing Provider  DULoxetine (CYMBALTA) 60 MG capsule Take 60 mg by mouth daily. 05/08/22  Yes [provider]  ferrous sulfate 325 (65 FE) MG tablet Take 325 mg by mouth daily. 06/02/22  Yes [provider]  silodosin (RAPAFLO) 8 MG CAPS capsule Take 8 mg by mouth daily. 03/18/22  Yes [provider]  apixaban (ELIQUIS) 5 MG TABS  tablet Take 5 mg by mouth 2 (two) times daily. 06/23/20   [provider]  clotrimazole-betamethasone (LOTRISONE) cream Apply 1 Application topically 2 (two) times daily. 10/16/21   [provider]  DENTA 5000 PLUS 1.1 % CREA dental cream Place 1 Application onto teeth 2 (two) times daily. 06/18/21   [provider]  FARXIGA 10 MG TABS tablet Take 10 mg by mouth daily. 10/10/21   [provider]  gabapentin (NEURONTIN) 300 MG capsule Take 2 capsules (600 mg total) by mouth 3 (three) times daily. 600 mg 4 times a day, and 1200 mg at bedtime. 11/02/21   Rodolph Bong, MD  metoprolol tartrate (LOPRESSOR) 25 MG tablet Take 25 mg by mouth 2 (two) times daily. 06/23/20   [provider]  Multiple Vitamin (MULTIVITAMIN WITH MINERALS) TABS tablet Take 1 tablet by mouth daily. 03/18/20   Myrtie Neither, MD  oxyCODONE (ROXICODONE) 15 MG immediate release tablet Take 15 mg by mouth 4 (four) times daily as needed for pain. 10/25/21   [provider]  pantoprazole (PROTONIX) 40 MG tablet Take 1 tablet (40 mg total) by mouth daily. 03/18/20   Myrtie Neither, MD  QUEtiapine (SEROQUEL) 300 MG tablet Take 600 mg by mouth at bedtime. 10/16/21   [provider]  Vitamin D, Ergocalciferol, (DRISDOL) 1.25 MG (50000 UNIT) CAPS capsule Take 50,000 Units by mouth once a week. Thursdays 09/09/21  [provider]      Allergies    Desmopressin acetate and Zoloft [sertraline]    Review of Systems   Review of Systems  Respiratory:  Negative for shortness of breath.   Cardiovascular:  Negative for chest pain.  Gastrointestinal:  Positive for abdominal pain. Negative for diarrhea, nausea and vomiting.  Genitourinary:  Negative for dysuria and flank pain.  All other systems reviewed and are negative.   Physical Exam Updated Vital Signs BP 126/80   Pulse 91   Temp 97.8 F (36.6 C) (Oral)   Resp 11   Ht 6\' 2"  (1.88 m)   Wt 90.7 kg   SpO2 99%   BMI  25.68 kg/m  Physical Exam Vitals and nursing note reviewed.  Constitutional:      General: He is not in acute distress.    Appearance: Normal appearance. He is not ill-appearing, toxic-appearing or diaphoretic.  HENT:     Head: Normocephalic and atraumatic.     Nose: Nose normal.     Mouth/Throat:     Mouth: Mucous membranes are moist.     Pharynx: Oropharynx is clear.  Eyes:     Extraocular Movements: Extraocular movements intact.     Conjunctiva/sclera: Conjunctivae normal.     Pupils: Pupils are equal, round, and reactive to light.  Cardiovascular:     Rate and Rhythm: Tachycardia present. Rhythm irregular.  Pulmonary:     Effort: Pulmonary effort is normal.     Breath sounds: Normal breath sounds. No wheezing.  Abdominal:     General: Abdomen is flat. Bowel sounds are normal.     Palpations: Abdomen is soft.     Tenderness: There is abdominal tenderness.     Comments: RUQ tenderness, negative murphy sign  Musculoskeletal:     Cervical back: Normal range of motion and neck supple. No tenderness.  Skin:    General: Skin is warm and dry.     Capillary Refill: Capillary refill takes less than 2 seconds.  Neurological:     General: No focal deficit present.     Mental Status: He is alert and oriented to person, place, and time.     GCS: GCS eye subscore is 4. GCS verbal subscore is 5. GCS motor subscore is 6.     Cranial Nerves: Cranial nerves 2-12 are intact. No cranial nerve deficit.     Sensory: Sensation is intact. No sensory deficit.     Motor: Motor function is intact. No weakness.     Coordination: Coordination is intact. Heel to Shin Test normal.     ED Results / Procedures / Treatments   Labs (all labs ordered are listed, but only abnormal results are displayed) Labs Reviewed  LIPASE, BLOOD - Abnormal; Notable for the following components:      Result Value   Lipase <10 (*)    All other components within normal limits  COMPREHENSIVE METABOLIC PANEL -  Abnormal; Notable for the following components:   Sodium 134 (*)    Chloride 97 (*)    Glucose, Bld 118 (*)    Creatinine, Ser 1.76 (*)    GFR, Estimated 39 (*)    All other components within normal limits  CBC - Abnormal; Notable for the following components:   RBC 3.65 (*)    Hemoglobin 11.2 (*)    HCT 34.6 (*)    Platelets 144 (*)    All other components within normal limits  URINALYSIS, ROUTINE W REFLEX MICROSCOPIC - Abnormal; Notable  for the following components:   Hgb urine dipstick TRACE (*)    All other components within normal limits  RESP PANEL BY RT-PCR (RSV, FLU A&B, COVID)  [id number]    EKG None  Radiology CT Angio Chest PE W and/or Wo Contrast  Result Date: 06/11/2022 CLINICAL DATA:  Chest and abdominal pain. EXAM: CT ANGIOGRAPHY CHEST CT ABDOMEN AND PELVIS WITH CONTRAST TECHNIQUE: Multidetector CT imaging of the chest was performed using the standard protocol during bolus administration of intravenous contrast. Multiplanar CT image reconstructions and MIPs were obtained to evaluate the vascular anatomy. Multidetector CT imaging of the abdomen and pelvis was performed using the standard protocol during bolus administration of intravenous contrast. RADIATION DOSE REDUCTION: This exam was performed according to the departmental dose-optimization program which includes automated exposure control, adjustment of the mA and/or kV according to patient size and/or use of iterative reconstruction technique. CONTRAST:  75mL OMNIPAQUE IOHEXOL 350 MG/ML SOLN COMPARISON:  None Available. FINDINGS: CTA CHEST FINDINGS Cardiovascular: The heart is normal in size. No pericardial effusion. The aorta is normal in caliber. No dissection. Scattered atherosclerotic calcification. The branch vessels are patent. Scattered coronary artery calcifications. The pulmonary arterial tree is well opacified. No filling defects to suggest pulmonary embolism. Mediastinum/Nodes: Numerous borderline mediastinal and  hilar lymph nodes. These are likely reactive given the significant underlying lung disease. The esophagus is grossly normal. The thyroid gland is unremarkable. Lungs/Pleura: Emphysematous changes and pulmonary scarring. Patchy E areas of ground-glass opacity most notably in the left upper lobe likely representing an inflammatory or atypical infectious process. Vague nodularity at the right lung base likely atelectasis. No worrisome pulmonary lesions or nodules. Soft tissue density the and both mainstem bronchi, left larger than right. This is most likely inflammatory debris/mucous. No endobronchial mass is identified. Musculoskeletal: No significant bony findings. Review of the MIP images confirms the above findings. CT ABDOMEN and PELVIS FINDINGS Hepatobiliary: No hepatic lesions or intrahepatic biliary dilatation. Small gallstones are noted the gallbladder. No findings for acute cholecystitis. No common bile duct dilatation. Pancreas: Significant diffuse fatty change involving the pancreas with very little normal residual parenchyma. No inflammation or mass. Spleen: Elongated spleen but no overt splenomegaly. No splenic lesions. Adrenals/Urinary Tract: Adrenal glands and kidneys are unremarkable. No renal lesions, renal calculi or hydronephrosis. The bladder is unremarkable. Stomach/Bowel: The stomach, duodenum, small and colon are unremarkable no acute inflammatory process, mass lesions or obstructive findings. The terminal ileum and appendix are normal. Scattered colonic diverticulosis but no acute diverticulitis. Vascular/Lymphatic: Moderate atherosclerotic calcification involving the aorta and iliac arteries. 3.2 x 3.2 cm infrarenal abdominal aortic aneurysm. Recommend follow-up ultrasound every 3 years. This recommendation follows ACR consensus guidelines: White Paper of the ACR Incidental Findings Committee II on Vascular Findings. J Am Coll Radiol 2013; 10:789-794. No mesenteric or retroperitoneal mass or  adenopathy. Reproductive: Enlarged prostate gland with median lobe hypertrophy impressing on the base of the bladder. Brachytherapy seeds are noted. The seminal vesicles are unremarkable. Other: No pelvic mass or adenopathy. No free pelvic fluid collections. No inguinal mass or adenopathy. Small periumbilical abdominal wall hernia containing fat. Musculoskeletal: No significant bony findings. Age related degenerative lumbar spondylosis with multilevel disc disease and facet disease. Review of the MIP images confirms the above findings. IMPRESSION: 1. No CT findings for pulmonary embolism. 2. Normal caliber thoracic aorta. 3. Emphysematous changes and pulmonary scarring. 4. Borderline enlarged mediastinal and hilar lymph nodes likely reactive given the lung findings. 5. Patchy areas of ground-glass opacity most notably in  the left upper lobe likely representing an inflammatory or atypical infectious process. Follow-up chest CT in 3-4 months may be helpful to reassess the lungs and the mediastinal/hilar lymph nodes. 6. Soft tissue density the and both mainstem bronchi, left larger than right. This is most likely inflammatory debris/mucous. 7. No acute abdominal/pelvic findings, mass lesions or adenopathy. 8. Cholelithiasis. 9. 3.2 x 3.2 cm infrarenal abdominal aortic aneurysm. Recommend follow-up ultrasound every 3 years. 10. Enlarged prostate gland with median lobe hypertrophy impressing on the base of the bladder. Brachytherapy seeds are noted. 11. Aortic atherosclerosis. Aortic Atherosclerosis (ICD10-I70.0) and Emphysema (ICD10-J43.9). CT Electronically Signed   By: Rudie Meyer M.D.   On: 06/11/2022 16:01   CT ABDOMEN PELVIS W CONTRAST  Result Date: 06/11/2022 CLINICAL DATA:  Chest and abdominal pain. EXAM: CT ANGIOGRAPHY CHEST CT ABDOMEN AND PELVIS WITH CONTRAST TECHNIQUE: Multidetector CT imaging of the chest was performed using the standard protocol during bolus administration of intravenous contrast.  Multiplanar CT image reconstructions and MIPs were obtained to evaluate the vascular anatomy. Multidetector CT imaging of the abdomen and pelvis was performed using the standard protocol during bolus administration of intravenous contrast. RADIATION DOSE REDUCTION: This exam was performed according to the departmental dose-optimization program which includes automated exposure control, adjustment of the mA and/or kV according to patient size and/or use of iterative reconstruction technique. CONTRAST:  75mL OMNIPAQUE IOHEXOL 350 MG/ML SOLN COMPARISON:  None Available. FINDINGS: CTA CHEST FINDINGS Cardiovascular: The heart is normal in size. No pericardial effusion. The aorta is normal in caliber. No dissection. Scattered atherosclerotic calcification. The branch vessels are patent. Scattered coronary artery calcifications. The pulmonary arterial tree is well opacified. No filling defects to suggest pulmonary embolism. Mediastinum/Nodes: Numerous borderline mediastinal and hilar lymph nodes. These are likely reactive given the significant underlying lung disease. The esophagus is grossly normal. The thyroid gland is unremarkable. Lungs/Pleura: Emphysematous changes and pulmonary scarring. Patchy E areas of ground-glass opacity most notably in the left upper lobe likely representing an inflammatory or atypical infectious process. Vague nodularity at the right lung base likely atelectasis. No worrisome pulmonary lesions or nodules. Soft tissue density the and both mainstem bronchi, left larger than right. This is most likely inflammatory debris/mucous. No endobronchial mass is identified. Musculoskeletal: No significant bony findings. Review of the MIP images confirms the above findings. CT ABDOMEN and PELVIS FINDINGS Hepatobiliary: No hepatic lesions or intrahepatic biliary dilatation. Small gallstones are noted the gallbladder. No findings for acute cholecystitis. No common bile duct dilatation. Pancreas: Significant  diffuse fatty change involving the pancreas with very little normal residual parenchyma. No inflammation or mass. Spleen: Elongated spleen but no overt splenomegaly. No splenic lesions. Adrenals/Urinary Tract: Adrenal glands and kidneys are unremarkable. No renal lesions, renal calculi or hydronephrosis. The bladder is unremarkable. Stomach/Bowel: The stomach, duodenum, small and colon are unremarkable no acute inflammatory process, mass lesions or obstructive findings. The terminal ileum and appendix are normal. Scattered colonic diverticulosis but no acute diverticulitis. Vascular/Lymphatic: Moderate atherosclerotic calcification involving the aorta and iliac arteries. 3.2 x 3.2 cm infrarenal abdominal aortic aneurysm. Recommend follow-up ultrasound every 3 years. This recommendation follows ACR consensus guidelines: White Paper of the ACR Incidental Findings Committee II on Vascular Findings. J Am Coll Radiol 2013; 10:789-794. No mesenteric or retroperitoneal mass or adenopathy. Reproductive: Enlarged prostate gland with median lobe hypertrophy impressing on the base of the bladder. Brachytherapy seeds are noted. The seminal vesicles are unremarkable. Other: No pelvic mass or adenopathy. No free pelvic fluid collections. No  inguinal mass or adenopathy. Small periumbilical abdominal wall hernia containing fat. Musculoskeletal: No significant bony findings. Age related degenerative lumbar spondylosis with multilevel disc disease and facet disease. Review of the MIP images confirms the above findings. IMPRESSION: 1. No CT findings for pulmonary embolism. 2. Normal caliber thoracic aorta. 3. Emphysematous changes and pulmonary scarring. 4. Borderline enlarged mediastinal and hilar lymph nodes likely reactive given the lung findings. 5. Patchy areas of ground-glass opacity most notably in the left upper lobe likely representing an inflammatory or atypical infectious process. Follow-up chest CT in 3-4 months may be  helpful to reassess the lungs and the mediastinal/hilar lymph nodes. 6. Soft tissue density the and both mainstem bronchi, left larger than right. This is most likely inflammatory debris/mucous. 7. No acute abdominal/pelvic findings, mass lesions or adenopathy. 8. Cholelithiasis. 9. 3.2 x 3.2 cm infrarenal abdominal aortic aneurysm. Recommend follow-up ultrasound every 3 years. 10. Enlarged prostate gland with median lobe hypertrophy impressing on the base of the bladder. Brachytherapy seeds are noted. 11. Aortic atherosclerosis. Aortic Atherosclerosis (ICD10-I70.0) and Emphysema (ICD10-J43.9). CT Electronically Signed   By: Rudie Meyer M.D.   On: 06/11/2022 16:01   US Abdomen Limited RUQ (LIVER/GB)  Result Date: 06/11/2022 CLINICAL DATA:  Right upper quadrant pain for 2 days EXAM: ULTRASOUND ABDOMEN LIMITED RIGHT UPPER QUADRANT COMPARISON:  None Available. FINDINGS: Gallbladder: No wall thickening. There are a few stones measuring up to 0.6 cm. No sonographic Murphy sign noted by sonographer. Common bile duct: Diameter: 0.6 cm, within normal limits Liver: No focal lesion identified. Within normal limits in parenchymal echogenicity. Portal vein is patent on color Doppler imaging with normal direction of blood flow towards the liver. Other: None. IMPRESSION: Cholelithiasis without sonographic evidence of acute cholecystitis. Electronically Signed   By: Emmaline Kluver M.D.   On: 06/11/2022 14:40   DG Chest 2 View  Result Date: 06/11/2022 CLINICAL DATA:  Hypoxia. EXAM: CHEST - 2 VIEW COMPARISON:  X-ray 10/30/2021 FINDINGS: Hyperinflation. Chronic interstitial lung changes. No consolidation, pneumothorax. Slight blunting of the posteroinferior costophrenic angles. Tiny effusion versus pleural thickening. Normal cardiopericardial silhouette without edema. Fixation hardware seen of the lower cervical spine at the edge of the imaging field. Degenerative changes of the thoracic spine. IMPRESSION: Hyperinflation  with chronic lung changes. Tiny effusion versus pleural thickening bilaterally. No consolidation. Electronically Signed   By: Karen Kays M.D.   On: 06/11/2022 13:28   CT Head Wo Contrast  Result Date: 06/11/2022 CLINICAL DATA:  TIA EXAM: CT HEAD WITHOUT CONTRAST TECHNIQUE: Contiguous axial images were obtained from the base of the skull through the vertex without intravenous contrast. RADIATION DOSE REDUCTION: This exam was performed according to the departmental dose-optimization program which includes automated exposure control, adjustment of the mA and/or kV according to patient size and/or use of iterative reconstruction technique. COMPARISON:  10/30/21 CT Head FINDINGS: Brain: No evidence of acute infarction, hemorrhage, hydrocephalus, extra-axial collection or mass lesion/mass effect. Sequela of mild chronic microvascular ischemic change Vascular: No hyperdense vessel or unexpected calcification. Skull: Normal. Negative for fracture or focal lesion. Sinuses/Orbits: No middle ear or mastoid effusion. Paranasal sinuses are clear. Right lens replacement. Orbits otherwise unremarkable. Other: None. IMPRESSION: No acute intracranial abnormality. Electronically Signed   By: Lorenza Cambridge M.D.   On: 06/11/2022 13:13    Procedures .Critical Care  Performed by: Al Decant, PA-C Authorized by: Al Decant, PA-C   Critical care provider statement:    Critical care time (minutes):  75   Critical  care time was exclusive of:  Separately billable procedures and treating other patients   Critical care was necessary to treat or prevent imminent or life-threatening deterioration of the following conditions:  Respiratory failure   Critical care was time spent personally by me on the following activities:  Blood draw for specimens, development of treatment plan with patient or surrogate, discussions with consultants, discussions with primary provider, evaluation of patient's response to  treatment, examination of patient, interpretation of cardiac output measurements, obtaining history from patient or surrogate, review of old charts, re-evaluation of patient's condition, pulse oximetry, ordering and review of radiographic studies, ordering and review of laboratory studies and ordering and performing treatments and interventions   I assumed direction of critical care for this patient from another provider in my specialty: no     Care discussed with: admitting provider      Medications Ordered in ED Medications  azithromycin (ZITHROMAX) 500 mg in sodium chloride 0.9 % 250 mL IVPB (500 mg Intravenous New Bag/Given 06/11/22 1824)  sodium chloride 0.9 % bolus 1,000 mL (0 mLs Intravenous Stopped 06/11/22 1600)  iohexol (OMNIPAQUE) 350 MG/ML injection 100 mL (75 mLs Intravenous Contrast Given 06/11/22 1518)  cefTRIAXone (ROCEPHIN) 1 g in sodium chloride 0.9 % 100 mL IVPB (0 g Intravenous Stopped 06/11/22 1815)    ED Course/ Medical Decision Making/ A&P Clinical Course as of 06/11/22 1854  Tue Jun 11, 2022  1656 Desats to 82% on room air [CG]    Clinical Course User Index [CG] Clent Ridges   {    Medical Decision Making Amount and/or Complexity of Data Reviewed Labs: ordered. Radiology: ordered.  Risk Prescription drug management.   82 year old male presents to the ED for evaluation.  Please see HPI for further details.  On my examination the patient is afebrile, tachycardic initially.  Patient lung sounds clear bilaterally however he is hypoxic with oxygen saturation of 88% on room air.  Abdomen soft and compressible however tenderness in the right upper quadrant with a negative Murphy sign.  Neurological examination without focal neurodeficits.  Patient is overall nontoxic in appearance.  CBC without leukocytosis, hemoglobin baseline.  CMP with sodium 134, creatinine 1.76 which is baseline.  Lipase is less than 10.  Urinalysis unremarkable.  Viral panel  negative for all.  Patient initially hypoxic and tachycardic on the monitor when I walked into the room.  Patient send also telling story of patient feeling as if he is walking through snow and concerned about TIA.  CT scan of head shows no intracranial normality.  Chest x-ray unremarkable without evidence of consolidation or effusion, does show changes of emphysema.  Right upper quadrant ultrasound shows cholelithiasis without evidence of cholecystitis.  CTA shows patchy groundglass opacity in left upper lobe concerning for atypical infectious process.  CT abdomen pelvis unremarkable except for cholelithiasis.  At this time the patient is hypoxic, does not have a leukocytosis.  Patient could have community-acquired pneumonia or atypical viral process.  Viral panel negative for all.  Will give patient community-acquired pneumonia treatment to include Rocephin, azithromycin.  Will also give patient 1 L fluid due to elevated creatinine and having to give patient IV contrast.  Patient will need admission due to hypoxia.  Dr. Deno Etienne, [facility] hospitalist, has agreed to admit patient for further management.  Patient amenable to plan.   Final Clinical Impression(s) / ED Diagnoses Final diagnoses:  Hypoxia  Community acquired pneumonia of left upper lobe of lung    Rx /  DC Orders ED Discharge Orders     None         Clent Ridges 06/11/22 1854    Ernie Avena, MD 06/11/22 [phone]

## 2022-06-11 NOTE — ED Notes (Signed)
Oxygen increased to 5L due to desaturations while sleeping.

## 2022-06-11 NOTE — ED Notes (Signed)
Oxygen saturation qualifications for home use:  Patient on room air while @ rest 88% Patient on room air while ambulating 82% Patient on 2lpm [place] while ambulating 87% Patient on 3lpm [place] while ambulating 90% Patient on 4lpm [place] while ambulating 93% Patient on 4lpm [place] at rest 97%  Note: Patient qualifies for home oxygen  

## 2022-06-11 NOTE — ED Provider Notes (Incomplete)
EMERGENCY DEPARTMENT AT [facility] Provider Note   CSN: [id number] Arrival date & time: 06/11/22  1154     History  Chief Complaint  Patient presents with  . Abdominal Pain    Dustin [name] is a 82 y.o. male with medical history of BPH, CHF, GERD, neuropathy, A-fib on Eliquis.  Patient presents to ED for evaluation of right upper quadrant pain.  Patient reports that he believes it is a gallbladder.  Patient states that for the last 2 days he has had upper quadrant colicky abdominal pain that is constant.  Patient denies alleviating or aggravating factors.  Patient reports he is still eating, ingesting food does not change the quality of his pain.  Patient denies nausea, vomiting, diarrhea, dysuria, flank pain.  Patient does report that his urine is "more orange" than usual.  Patient son adds that he believes the patient has had many strokes because the patient fell asleep in front of the couch 2 days ago, woke up at 1:30 AM and had a hard time walking to his room.  The patient reports that he felt as if he was "walking through snow" on the way to his room.  Patient denies any feeling of one-sided weakness or numbness at present.  Patient also noted to be tachycardic and hypoxic on the monitor when I enter the room.  Patient denies chest pain or shortness of breath.  Denies calf pain.   Abdominal Pain      Home Medications Prior to Admission medications   Medication Sig Start Date End Date Taking? Authorizing Provider  apixaban (ELIQUIS) 5 MG TABS tablet Take 5 mg by mouth 2 (two) times daily. 06/23/20   [provider]  clotrimazole-betamethasone (LOTRISONE) cream Apply 1 Application topically 2 (two) times daily. 10/16/21   [provider]  DENTA 5000 PLUS 1.1 % CREA dental cream Place 1 Application onto teeth 2 (two) times daily. 06/18/21   [provider]  FARXIGA 10 MG TABS tablet Take 10 mg by mouth daily. 10/10/21   [provider]  gabapentin (NEURONTIN) 300 MG capsule Take 2 capsules (600 mg total) by mouth 3 (three) times daily. 600 mg 4 times a day, and 1200 mg at bedtime. 11/02/21   Rodolph Bong, MD  metoprolol tartrate (LOPRESSOR) 25 MG tablet Take 25 mg by mouth 2 (two) times daily. 06/23/20   [provider]  Multiple Vitamin (MULTIVITAMIN WITH MINERALS) TABS tablet Take 1 tablet by mouth daily. 03/18/20   Myrtie Neither, MD  oxyCODONE (ROXICODONE) 15 MG immediate release tablet Take 15 mg by mouth 4 (four) times daily as needed for pain. 10/25/21   [provider]  pantoprazole (PROTONIX) 40 MG tablet Take 1 tablet (40 mg total) by mouth daily. 03/18/20   Myrtie Neither, MD  QUEtiapine (SEROQUEL) 300 MG tablet Take 600 mg by mouth at bedtime. 10/16/21   [provider]  Vitamin D, Ergocalciferol, (DRISDOL) 1.25 MG (50000 UNIT) CAPS capsule Take 50,000 Units by mouth once a week. Thursdays 09/09/21   [provider]      Allergies    Desmopressin acetate and Zoloft [sertraline]    Review of Systems   Review of Systems  Gastrointestinal:  Positive for abdominal pain.    Physical Exam Updated Vital Signs BP 121/76 (BP Location: Right Arm)   Pulse 100   Temp 97.9 F (36.6 C)   Resp 16   Ht  (1.88 m)   Wt 90.7 kg  SpO2 95%   BMI 25.68 kg/m  Physical Exam  ED Results / Procedures / Treatments   Labs (all labs ordered are listed, but only abnormal results are displayed) Labs Reviewed  CBC - Abnormal; Notable for the following components:      Result Value   RBC 3.65 (*)    Hemoglobin 11.2 (*)    HCT 34.6 (*)    Platelets 144 (*)    All other components within normal limits  URINALYSIS, ROUTINE W REFLEX MICROSCOPIC - Abnormal; Notable for the following components:   Hgb urine dipstick TRACE (*)    All other components within normal limits  LIPASE, BLOOD  COMPREHENSIVE METABOLIC PANEL    EKG None  Radiology No results  found.  Procedures Procedures  {Document cardiac monitor, telemetry assessment procedure when appropriate:1}  Medications Ordered in ED Medications - No data to display  ED Course/ Medical Decision Making/ A&P   {   Click here for ABCD2, HEART and other calculatorsREFRESH Note before signing :1}                          Medical Decision Making Amount and/or Complexity of Data Reviewed Labs: ordered.   ***  {Document critical care time when appropriate:1} {Document review of labs and clinical decision tools ie heart score, Chads2Vasc2 etc:1}  {Document your independent review of radiology images, and any outside records:1} {Document your discussion with family members, caretakers, and with consultants:1} {Document social determinants of health affecting pt's care:1} {Document your decision making why or why not admission, treatments were needed:1} Final Clinical Impression(s) / ED Diagnoses Final diagnoses:  None    Rx / DC Orders ED Discharge Orders     None

## 2022-06-11 NOTE — ED Notes (Signed)
[facility] at bedside 

## 2022-06-11 NOTE — ED Triage Notes (Signed)
Patient here POV from Home.  Endorses RLQ ABD Pain that began 1-2 Days ago. No N/V/D. No Fever. Urine noted to be more "Orange".   NAD Noted during Triage. A&Ox4. GCS 15. Ambulatory.

## 2022-06-11 NOTE — Progress Notes (Signed)
Plan of Care Note for accepted transfer   Patient: Dustin [name] MRN: [id number]   DOA: 06/11/2022  Facility requesting transfer: [facility] Requesting Provider: Delice Bison, PA Reason for transfer: Acute hypoxic respiratory failure Facility course: 82 yo  BPH, neuropathy, memory loss, depression, GERD, systolic and diastolic heart failure, RLS, Permanent A-fib on Eliquis who presented with RUQ pain.   Noted to be hypoxic to 88% at rest and 82% with ambulation requiring up to 4L.  CTA chest obtained negative for PE but had patchy ground-glass opacity to LUL.   CT abd/pelvis and RUQ ultrasound showed cholelithiasis but no acute cholecystitis.   He is afebrile without leukocytosis but given CT imaging finding and hypoxia he was treated for presumed pneumonia with IV Rocephin and Azithromcyin.   Plan of care: The patient is accepted for admission to Telemetry unit, at [facility] ED PA to continue care of patient while he remains in ED.   Author: Anselm Jungling, DO 06/11/2022  Check [email or web address] for on-call coverage.  Nursing staff, Please call [facility] Admits & Consults System-Wide number on Amion as soon as patient's arrival, so appropriate admitting provider can evaluate the pt.

## 2022-06-11 NOTE — Progress Notes (Signed)
Oxygen saturation qualifications for home use:  Patient on room air while @ rest 88% Patient on room air while ambulating 82% Patient on 2lpm [place] while ambulating 87% Patient on 3lpm [place] while ambulating 90% Patient on 4lpm [place] while ambulating 93% Patient on 4lpm [place] at rest 97%  Note: Patient qualifies for home oxygen

## 2022-06-11 NOTE — ED Notes (Signed)
Primary RN Brandi at bedside.

## 2022-06-11 NOTE — ED Notes (Signed)
EDP [facility] at bedside d/t concerns for hypotension and hx of CHF

## 2022-06-12 DIAGNOSIS — R109 Unspecified abdominal pain: Secondary | ICD-10-CM | POA: Diagnosis not present

## 2022-06-12 DIAGNOSIS — I4819 Other persistent atrial fibrillation: Secondary | ICD-10-CM

## 2022-06-12 DIAGNOSIS — I714 Abdominal aortic aneurysm, without rupture, unspecified: Secondary | ICD-10-CM

## 2022-06-12 DIAGNOSIS — J439 Emphysema, unspecified: Secondary | ICD-10-CM | POA: Insufficient documentation

## 2022-06-12 DIAGNOSIS — J9601 Acute respiratory failure with hypoxia: Secondary | ICD-10-CM | POA: Diagnosis not present

## 2022-06-12 LAB — RESPIRATORY PANEL BY PCR

## 2022-06-12 LAB — CBC WITH DIFFERENTIAL/PLATELET
Abs Immature Granulocytes: 0.01 10*3/uL (ref 0.00–0.07)
Basophils Absolute: 0 10*3/uL (ref 0.0–0.1)
Basophils Relative: 0 %
Eosinophils Absolute: 0.2 10*3/uL (ref 0.0–0.5)
Eosinophils Relative: 5 %
HCT: 31.4 % — ABNORMAL LOW (ref 39.0–52.0)
Hemoglobin: 9.9 g/dL — ABNORMAL LOW (ref 13.0–17.0)
Immature Granulocytes: 0 %
Lymphocytes Relative: 26 %
Lymphs Abs: 1.1 10*3/uL (ref 0.7–4.0)
MCH: 30.3 pg (ref 26.0–34.0)
MCHC: 31.5 g/dL (ref 30.0–36.0)
MCV: 96 fL (ref 80.0–100.0)
Monocytes Absolute: 0.5 10*3/uL (ref 0.1–1.0)
Monocytes Relative: 11 %
Neutro Abs: 2.5 10*3/uL (ref 1.7–7.7)
Neutrophils Relative %: 58 %
Platelets: 140 10*3/uL — ABNORMAL LOW (ref 150–400)
RBC: 3.27 MIL/uL — ABNORMAL LOW (ref 4.22–5.81)
RDW: 12.6 % (ref 11.5–15.5)
WBC: 4.3 10*3/uL (ref 4.0–10.5)
nRBC: 0 % (ref 0.0–0.2)

## 2022-06-12 LAB — BASIC METABOLIC PANEL
Anion gap: 7 (ref 5–15)
BUN: 16 mg/dL (ref 8–23)
CO2: 28 mmol/L (ref 22–32)
Calcium: 8.2 mg/dL — ABNORMAL LOW (ref 8.9–10.3)
Chloride: 100 mmol/L (ref 98–111)
Creatinine, Ser: 1.46 mg/dL — ABNORMAL HIGH (ref 0.61–1.24)
GFR, Estimated: 48 mL/min — ABNORMAL LOW (ref >60)
Glucose, Bld: 106 mg/dL — ABNORMAL HIGH (ref 70–99)
Potassium: 4.1 mmol/L (ref 3.5–5.1)
Sodium: 135 mmol/L (ref 135–145)

## 2022-06-12 LAB — PROCALCITONIN: Procalcitonin: <0.1 ng/mL

## 2022-06-12 LAB — MAGNESIUM: Magnesium: 2.1 mg/dL (ref 1.7–2.4)

## 2022-06-12 MED ORDER — METOPROLOL TARTRATE 25 MG PO TABS
25.0000 mg | ORAL_TABLET | Freq: Two times a day (BID) | ORAL | Status: DC
  Administered 2022-06-12 – 2022-06-13 (×4): 25 mg via ORAL
  Filled 2022-06-12 (×4): qty 1

## 2022-06-12 MED ORDER — TAMSULOSIN HCL 0.4 MG PO CAPS
0.4000 mg | ORAL_CAPSULE | Freq: Every day | ORAL | Status: DC
  Administered 2022-06-12 – 2022-06-13 (×2): 0.4 mg via ORAL
  Filled 2022-06-12 (×2): qty 1

## 2022-06-12 MED ORDER — GABAPENTIN 300 MG PO CAPS
600.0000 mg | ORAL_CAPSULE | Freq: Three times a day (TID) | ORAL | Status: DC
  Administered 2022-06-12 – 2022-06-13 (×5): 600 mg via ORAL
  Filled 2022-06-12 (×5): qty 2

## 2022-06-12 MED ORDER — QUETIAPINE FUMARATE 100 MG PO TABS: 600.0000 mg | ORAL_TABLET | Freq: Every day | ORAL | Status: DC

## 2022-06-12 MED ORDER — AZITHROMYCIN 250 MG PO TABS
500.0000 mg | ORAL_TABLET | Freq: Every day | ORAL | Status: DC
  Administered 2022-06-12 – 2022-06-13 (×2): 500 mg via ORAL
  Filled 2022-06-12 (×2): qty 2

## 2022-06-12 MED ORDER — GUAIFENESIN ER 600 MG PO TB12
1200.0000 mg | ORAL_TABLET | Freq: Two times a day (BID) | ORAL | Status: DC
  Administered 2022-06-12 – 2022-06-13 (×3): 1200 mg via ORAL
  Filled 2022-06-12 (×3): qty 2

## 2022-06-12 MED ORDER — OXYCODONE HCL 5 MG PO TABS
20.0000 mg | ORAL_TABLET | Freq: Three times a day (TID) | ORAL | Status: DC | PRN
  Administered 2022-06-12 – 2022-06-13 (×2): 20 mg via ORAL
  Filled 2022-06-12 (×2): qty 4

## 2022-06-12 MED ORDER — SODIUM CHLORIDE 0.9 % IV SOLN
2.0000 g | INTRAVENOUS | Status: DC
  Administered 2022-06-12: 2 g via INTRAVENOUS
  Filled 2022-06-12: qty 20

## 2022-06-12 MED ORDER — DAPAGLIFLOZIN PROPANEDIOL 10 MG PO TABS
10.0000 mg | ORAL_TABLET | Freq: Every day | ORAL | Status: DC
  Administered 2022-06-12 – 2022-06-13 (×2): 10 mg via ORAL
  Filled 2022-06-12 (×2): qty 1

## 2022-06-12 MED ORDER — METOPROLOL TARTRATE 5 MG/5ML IV SOLN: 5.0000 mg | INTRAVENOUS | Status: DC | PRN

## 2022-06-12 MED ORDER — APIXABAN 5 MG PO TABS
5.0000 mg | ORAL_TABLET | Freq: Two times a day (BID) | ORAL | Status: DC
  Administered 2022-06-12 – 2022-06-13 (×3): 5 mg via ORAL
  Filled 2022-06-12 (×3): qty 1

## 2022-06-12 MED ORDER — PANTOPRAZOLE SODIUM 40 MG PO TBEC
40.0000 mg | DELAYED_RELEASE_TABLET | Freq: Every day | ORAL | Status: DC
  Administered 2022-06-12 – 2022-06-13 (×2): 40 mg via ORAL
  Filled 2022-06-12 (×2): qty 1

## 2022-06-12 MED ORDER — FERROUS SULFATE 325 (65 FE) MG PO TABS
325.0000 mg | ORAL_TABLET | Freq: Every day | ORAL | Status: DC
  Administered 2022-06-12 – 2022-06-13 (×2): 325 mg via ORAL
  Filled 2022-06-12 (×2): qty 1

## 2022-06-12 MED ORDER — DULOXETINE HCL 60 MG PO CPEP
60.0000 mg | ORAL_CAPSULE | Freq: Every day | ORAL | Status: DC
  Administered 2022-06-12 – 2022-06-13 (×2): 60 mg via ORAL
  Filled 2022-06-12 (×2): qty 1

## 2022-06-12 NOTE — Assessment & Plan Note (Signed)
-   Resume home Lopressor and Eliquis - PRN Lopressor for RVR

## 2022-06-12 NOTE — Assessment & Plan Note (Signed)
-   asymptomatic at rest; possibly MSK for etiology.  No acute findings to explain noted on CT abdomen/pelvis nor RUQ ultrasound -Holding off on further workup unless were to worsen

## 2022-06-12 NOTE — TOC CM/SW Note (Signed)
  Transition of Care [facility]) Screening Note   Patient Details  Name: Dustin [name] Date of Birth: 1941/01/24   Transition of Care [facility]) CM/SW Contact:    Howell Rucks, RN Phone Number: 06/12/2022, 8:30 AM    Transition of Care Department [facility]) has reviewed patient and no TOC needs have been identified at this time. We will continue to monitor patient advancement through interdisciplinary progression rounds. If new patient transition needs arise, please place a TOC consult.

## 2022-06-12 NOTE — Hospital Course (Addendum)
Dustin [name] is an 82 yo male with PMH afib, chronic combined systolic/diastolic CHF, BPH, GERD, insomnia, neuropathy who originally presented to [facility] with right-sided abdominal pain.  He had also endorsed having darker than usual urine.  On further workup however, he was found to be hypoxic on room air down to mid 80s and was placed on oxygen. Multiple imaging studies were then obtained.  Notably CT angio chest was notable for emphysematous changes and pulmonary scarring along with patchy areas of groundglass opacities most notably in the left upper lobe concerning for an inflammatory or atypical infectious process.  There was also soft tissue density in both mainstem bronchi left larger than right, concerning for inflammatory debris/mucus.  Abdominal imaging was negative for acute findings to explain pain.  Did note cholelithiasis and 3.2 x 3.2 cm infrarenal abdominal aortic aneurysm.  Due to concern for pneumonia, he was given Rocephin, azithromycin and recommended for transfer for further pneumonia treatment and workup.  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  He does not wear a mask and is exposed to significant amounts of wood dust particles.  He denies any shortness of breath associated with this nor any productive cough.

## 2022-06-12 NOTE — Assessment & Plan Note (Addendum)
-   Continue Flomax (substituted for rapaflo) - per CT: "Enlarged prostate gland with median lobe hypertrophy impressing on the base of the bladder. Brachytherapy seeds are noted."

## 2022-06-12 NOTE — Assessment & Plan Note (Addendum)
-   no overt cough or sputum. CTA chest is negative for PE as well. Does mention GGO in LUL and also soft tissue densities in both mainstem bronchi concerning for inflammatory debris/mucus.  Possible that his wood particle exposure may be culprit to his hypoxia.  Otherwise, would suspect underlying infection, possibly atypical - Reasonable to continue antibiotics while awaiting further results - Check procalcitonin - Check RVP - Repeat CBC in a.m. -Not on home O2, wean oxygen as able

## 2022-06-12 NOTE — Assessment & Plan Note (Signed)
-   Followed by cardiology.  Last echo July 2022 showed improvement - EF 55%, indeterminate diastolic parameters -Continue Farxiga, Lopressor

## 2022-06-12 NOTE — Assessment & Plan Note (Signed)
Continue Cymbalta and Seroquel 

## 2022-06-12 NOTE — Assessment & Plan Note (Signed)
Continue Protonix °

## 2022-06-12 NOTE — Assessment & Plan Note (Signed)
-   no wheezing on exam, SOB, or cough - see hypoxia

## 2022-06-12 NOTE — Progress Notes (Signed)
SATURATION QUALIFICATIONS: (This note is used to comply with regulatory documentation for home oxygen)  Patient Saturations on Room Air at Rest = 95%  Patient Saturations on Room Air while Ambulating = 88%  Patient Saturations on 2 Liters of oxygen while Ambulating = 96%   

## 2022-06-12 NOTE — H&P (Signed)
History and Physical    Dustin [name]  EAV:[id number]  DOB: 10/09/1940  DOA: 06/11/2022  PCP: Darrow Bussing, MD Patient coming from: Home  Chief Complaint: abdominal pain, low oxygen  HPI:  Dustin [name] is an 82 yo male with PMH afib, chronic combined systolic/diastolic CHF, BPH, GERD, insomnia, neuropathy who originally presented to [facility] with right-sided abdominal pain.  He had also endorsed having darker than usual urine.  On further workup however, he was found to be hypoxic on room air down to mid 80s and was placed on oxygen. Multiple imaging studies were then obtained.  Notably CT angio chest was notable for emphysematous changes and pulmonary scarring along with patchy areas of groundglass opacities most notably in the left upper lobe concerning for an inflammatory or atypical infectious process.  There was also soft tissue density in both mainstem bronchi left larger than right, concerning for inflammatory debris/mucus.  Abdominal imaging was negative for acute findings to explain pain.  Did note cholelithiasis and 3.2 x 3.2 cm infrarenal abdominal aortic aneurysm.  Due to concern for pneumonia, he was given Rocephin, azithromycin and recommended for transfer for further pneumonia treatment and workup.  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  He does not wear a mask and is exposed to significant amounts of wood dust particles.  He denies any shortness of breath associated with this nor any productive cough.  I have personally briefly reviewed patient's old medical records in [facility] and discussed patient with the ER provider when appropriate/indicated.  Assessment and Plan: * Acute respiratory failure with hypoxia - no overt cough or sputum. CTA chest is negative for PE as well. Does mention GGO in LUL and also soft tissue densities in both mainstem bronchi concerning for  inflammatory debris/mucus.  Possible that his wood particle exposure may be culprit to his hypoxia.  Otherwise, would suspect underlying infection, possibly atypical - Reasonable to continue antibiotics while awaiting further results - Check procalcitonin - Check RVP - Repeat CBC in a.m. -Not on home O2, wean oxygen as able  Abdominal pain - asymptomatic at rest; possibly MSK for etiology.  No acute findings to explain noted on CT abdomen/pelvis nor RUQ ultrasound -Holding off on further workup unless were to worsen  Persistent atrial fibrillation - Resume home Lopressor and Eliquis - PRN Lopressor for RVR  AAA (abdominal aortic aneurysm) - per CT: "3.2 x 3.2 cm infrarenal abdominal aortic aneurysm. Recommend follow-up ultrasound every 3 years."  Emphysema of lung - no wheezing on exam, SOB, or cough - see hypoxia  Chronic combined systolic (congestive) and diastolic (congestive) heart failure (HCC) - Followed by cardiology.  Last echo July 2022 showed improvement - EF 55%, indeterminate diastolic parameters -Continue Farxiga, Lopressor  BPH (benign prostatic hyperplasia) - Continue Flomax (substituted for rapaflo) - per CT: "Enlarged prostate gland with median lobe hypertrophy impressing on the base of the bladder. Brachytherapy seeds are noted."  GERD (gastroesophageal reflux disease) - Continue Protonix  Depression - Continue Cymbalta and Seroquel   DVT Prophylaxis: Eliquis   apixaban (ELIQUIS) tablet 5 mg   Anticipated disposition is to: Home  History: Past Medical History:  Diagnosis Date   BPH (benign prostatic hyperplasia)    Chronic combined systolic and diastolic congestive heart failure    GERD (gastroesophageal reflux disease)    protonix for control   Insomnia    Neck pain  Neuropathy     Past Surgical History:  Procedure Laterality Date   ANTERIOR CERVICAL DECOMPRESSION/DISCECTOMY FUSION 4 LEVELS N/A 10/27/2012   Procedure: Cervical Three-Four  Cervical Four-Five Cervical Five-Six Cervical Six-Seven  Anterior cervical decompression/diskectomy/fusion;  Surgeon: Maeola Harman, MD;  Location: [facility]UR[facility];  Service: Neurosurgery;  Laterality: N/A;  Cervical Three-Four Cervical Four-Five Cervical Five-Six Cervical Six-Seven  Anterior cervical decompression/diskectomy/fusion   CARDIOVERSION N/A 09/20/2020   Procedure: CARDIOVERSION;  Surgeon: Little Ishikawa, MD;  Location: [facility] ENDOSCOPY;  Service: Cardiovascular;  Laterality: N/A;   FEMUR IM NAIL Right 03/08/2020   Procedure: INTRAMEDULLARY (IM) RETROGRADE FEMORAL NAILING;  Surgeon: Roby Lofts, MD;  Location: [facility] OR;  Service: Orthopedics;  Laterality: Right;   SHOULDER ARTHROSCOPY  2010   rt shoulder   SHOULDER ARTHROSCOPY  02/15/2011   Procedure: ARTHROSCOPY SHOULDER;  Surgeon: Erasmo Leventhal;  Location: [facility];  Service: Orthopedics;  Laterality: Right;  Debridement   TONSILLECTOMY       reports that he quit smoking about 32 years ago. His smoking use included cigarettes. He smoked an average of 1.5 packs per day. He has never used smokeless tobacco. He reports that he does not drink alcohol and does not use drugs.  Allergies  Allergen Reactions   Desmopressin Acetate Other (See Comments)    Unknown - Patient denies   Zoloft [Sertraline] Other (See Comments)    Felt bad - Patient denies    Family History  Problem Relation Age of Onset   Emphysema Mother    Cirrhosis Father    Stroke Brother    Home Medications: Prior to Admission medications   Medication Sig Start Date End Date Taking? Authorizing Provider  DULoxetine (CYMBALTA) 60 MG capsule Take 60 mg by mouth daily. 05/08/22  Yes [provider]  ferrous sulfate 325 (65 FE) MG tablet Take 325 mg by mouth daily. 06/02/22  Yes [provider]  silodosin (RAPAFLO) 8 MG CAPS capsule Take 8 mg by mouth daily. 03/18/22  Yes [provider]  apixaban (ELIQUIS) 5 MG TABS  tablet Take 5 mg by mouth 2 (two) times daily. 06/23/20   [provider]  clotrimazole-betamethasone (LOTRISONE) cream Apply 1 Application topically 2 (two) times daily. 10/16/21   [provider]  DENTA 5000 PLUS 1.1 % CREA dental cream Place 1 Application onto teeth 2 (two) times daily. 06/18/21   [provider]  FARXIGA 10 MG TABS tablet Take 10 mg by mouth daily. 10/10/21   [provider]  gabapentin (NEURONTIN) 300 MG capsule Take 2 capsules (600 mg total) by mouth 3 (three) times daily. 600 mg 4 times a day, and 1200 mg at bedtime. 11/02/21   Rodolph Bong, MD  metoprolol tartrate (LOPRESSOR) 25 MG tablet Take 25 mg by mouth 2 (two) times daily. 06/23/20   [provider]  Multiple Vitamin (MULTIVITAMIN WITH MINERALS) TABS tablet Take 1 tablet by mouth daily. 03/18/20   Myrtie Neither, MD  oxyCODONE (ROXICODONE) 15 MG immediate release tablet Take 15 mg by mouth 4 (four) times daily as needed for pain. 10/25/21   [provider]  pantoprazole (PROTONIX) 40 MG tablet Take 1 tablet (40 mg total) by mouth daily. 03/18/20   Myrtie Neither, MD  QUEtiapine (SEROQUEL) 300 MG tablet Take 600 mg by mouth at bedtime. 10/16/21   [provider]  Vitamin D, Ergocalciferol, (DRISDOL) 1.25 MG (50000 UNIT) CAPS capsule Take 50,000 Units by mouth once a week. Thursdays 09/09/21  [provider]    Review of Systems:  Review of Systems  Constitutional: Negative.   HENT: Negative.    Eyes: Negative.   Respiratory: Negative.    Cardiovascular: Negative.   Gastrointestinal:  Positive for abdominal pain.  Genitourinary: Negative.   Musculoskeletal: Negative.   Skin: Negative.   Neurological: Negative.   Endo/Heme/Allergies: Negative.   Psychiatric/Behavioral: Negative.      Physical Exam:  Vitals:   06/11/22 2059 06/11/22 2313 06/11/22 2321 06/12/22 0004  BP:   121/64 124/76  Pulse:    100  Resp:    16  Temp: 97.8 F (36.6 C)    97.7 F (36.5 C)  TempSrc: Oral   Oral  SpO2:  94%  91%  Weight:      Height:       Physical Exam Constitutional:      General: He is not in acute distress.    Appearance: He is well-developed. He is not ill-appearing.  HENT:     Head: Normocephalic and atraumatic.     Mouth/Throat:     Mouth: Mucous membranes are moist.  Eyes:     Pupils: Pupils are equal, round, and reactive to light.  Cardiovascular:     Rate and Rhythm: Tachycardia present. Rhythm irregular.  Pulmonary:     Effort: Pulmonary effort is normal. No respiratory distress.     Breath sounds: Normal breath sounds. No wheezing or rhonchi.  Abdominal:     General: Bowel sounds are normal. There is no distension.     Palpations: Abdomen is soft.     Tenderness: There is abdominal tenderness (subtle in lower right quadrant). There is no guarding or rebound.  Musculoskeletal:        General: No swelling. Normal range of motion.     Cervical back: Normal range of motion and neck supple.  Skin:    General: Skin is warm and dry.  Neurological:     General: No focal deficit present.     Mental Status: He is alert.  Psychiatric:        Mood and Affect: Mood normal.      Labs on Admission:  I have personally reviewed following labs and imaging studies Results for orders placed or performed during the hospital encounter of 06/11/22 (from the past 24 hour(s))  Lipase, blood     Status: Abnormal   Collection Time: 06/11/22 12:04 PM  Result Value Ref Range   Lipase <10 (L) 11 - 51 U/L  Comprehensive metabolic panel     Status: Abnormal   Collection Time: 06/11/22 12:04 PM  Result Value Ref Range   Sodium 134 (L) 135 - 145 mmol/L   Potassium 4.5 3.5 - 5.1 mmol/L   Chloride 97 (L) 98 - 111 mmol/L   CO2 29 22 - 32 mmol/L   Glucose, Bld 118 (H) 70 - 99 mg/dL   BUN 21 8 - 23 mg/dL   Creatinine, Ser 0.98 (H) 0.61 - 1.24 mg/dL   Calcium 9.1 8.9 - 11.9 mg/dL   Total Protein 7.0 6.5 - 8.1 g/dL   Albumin 4.2 3.5 - 5.0  g/dL   AST 35 15 - 41 U/L   ALT 12 0 - 44 U/L   Alkaline Phosphatase 74 38 - 126 U/L   Total Bilirubin 0.9 0.3 - 1.2 mg/dL   GFR, Estimated 39 (L) >60 mL/min   Anion gap 8 5 - 15  CBC     Status: Abnormal   Collection Time:  06/11/22 12:04 PM  Result Value Ref Range   WBC 7.3 4.0 - 10.5 K/uL   RBC 3.65 (L) 4.22 - 5.81 MIL/uL   Hemoglobin 11.2 (L) 13.0 - 17.0 g/dL   HCT 40.9 (L) 81.1 - 91.4 %   MCV 94.8 80.0 - 100.0 fL   MCH 30.7 26.0 - 34.0 pg   MCHC 32.4 30.0 - 36.0 g/dL   RDW 78.2 95.6 - 21.3 %   Platelets 144 (L) 150 - 400 K/uL   nRBC 0.0 0.0 - 0.2 %  Urinalysis, Routine w reflex microscopic -Urine, Clean Catch     Status: Abnormal   Collection Time: 06/11/22 12:04 PM  Result Value Ref Range   Color, Urine YELLOW YELLOW   APPearance CLEAR CLEAR   Specific Gravity, Urine 1.009 1.005 - 1.030   pH 6.5 5.0 - 8.0   Glucose, UA NEGATIVE NEGATIVE mg/dL   Hgb urine dipstick TRACE (A) NEGATIVE   Bilirubin Urine NEGATIVE NEGATIVE   Ketones, ur NEGATIVE NEGATIVE mg/dL   Protein, ur NEGATIVE NEGATIVE mg/dL   Nitrite NEGATIVE NEGATIVE   Leukocytes,Ua NEGATIVE NEGATIVE   RBC / HPF 0-5 0 - 5 RBC/hpf   WBC, UA 0-5 0 - 5 WBC/hpf   Bacteria, UA NONE SEEN NONE SEEN   Squamous Epithelial / HPF 0-5 0 - 5 /HPF  Resp panel by RT-PCR (RSV, Flu A&B, Covid) Anterior Nasal Swab     Status: None   Collection Time: 06/11/22  4:41 PM   Specimen: Anterior Nasal Swab  Result Value Ref Range   SARS Coronavirus 2 by RT PCR NEGATIVE NEGATIVE   Influenza A by PCR NEGATIVE NEGATIVE   Influenza B by PCR NEGATIVE NEGATIVE   Resp Syncytial Virus by PCR NEGATIVE NEGATIVE     Radiological Exams on Admission: CT Angio Chest PE W and/or Wo Contrast  Result Date: 06/11/2022 CLINICAL DATA:  Chest and abdominal pain. EXAM: CT ANGIOGRAPHY CHEST CT ABDOMEN AND PELVIS WITH CONTRAST TECHNIQUE: Multidetector CT imaging of the chest was performed using the standard protocol during bolus administration of intravenous  contrast. Multiplanar CT image reconstructions and MIPs were obtained to evaluate the vascular anatomy. Multidetector CT imaging of the abdomen and pelvis was performed using the standard protocol during bolus administration of intravenous contrast. RADIATION DOSE REDUCTION: This exam was performed according to the departmental dose-optimization program which includes automated exposure control, adjustment of the mA and/or kV according to patient size and/or use of iterative reconstruction technique. CONTRAST:  75mL OMNIPAQUE IOHEXOL 350 MG/ML SOLN COMPARISON:  None Available. FINDINGS: CTA CHEST FINDINGS Cardiovascular: The heart is normal in size. No pericardial effusion. The aorta is normal in caliber. No dissection. Scattered atherosclerotic calcification. The branch vessels are patent. Scattered coronary artery calcifications. The pulmonary arterial tree is well opacified. No filling defects to suggest pulmonary embolism. Mediastinum/Nodes: Numerous borderline mediastinal and hilar lymph nodes. These are likely reactive given the significant underlying lung disease. The esophagus is grossly normal. The thyroid gland is unremarkable. Lungs/Pleura: Emphysematous changes and pulmonary scarring. Patchy E areas of ground-glass opacity most notably in the left upper lobe likely representing an inflammatory or atypical infectious process. Vague nodularity at the right lung base likely atelectasis. No worrisome pulmonary lesions or nodules. Soft tissue density the and both mainstem bronchi, left larger than right. This is most likely inflammatory debris/mucous. No endobronchial mass is identified. Musculoskeletal: No significant bony findings. Review of the MIP images confirms the above findings. CT ABDOMEN and PELVIS FINDINGS Hepatobiliary: No hepatic  lesions or intrahepatic biliary dilatation. Small gallstones are noted the gallbladder. No findings for acute cholecystitis. No common bile duct dilatation. Pancreas:  Significant diffuse fatty change involving the pancreas with very little normal residual parenchyma. No inflammation or mass. Spleen: Elongated spleen but no overt splenomegaly. No splenic lesions. Adrenals/Urinary Tract: Adrenal glands and kidneys are unremarkable. No renal lesions, renal calculi or hydronephrosis. The bladder is unremarkable. Stomach/Bowel: The stomach, duodenum, small and colon are unremarkable no acute inflammatory process, mass lesions or obstructive findings. The terminal ileum and appendix are normal. Scattered colonic diverticulosis but no acute diverticulitis. Vascular/Lymphatic: Moderate atherosclerotic calcification involving the aorta and iliac arteries. 3.2 x 3.2 cm infrarenal abdominal aortic aneurysm. Recommend follow-up ultrasound every 3 years. This recommendation follows ACR consensus guidelines: White Paper of the ACR Incidental Findings Committee II on Vascular Findings. J Am Coll Radiol 2013; 10:789-794. No mesenteric or retroperitoneal mass or adenopathy. Reproductive: Enlarged prostate gland with median lobe hypertrophy impressing on the base of the bladder. Brachytherapy seeds are noted. The seminal vesicles are unremarkable. Other: No pelvic mass or adenopathy. No free pelvic fluid collections. No inguinal mass or adenopathy. Small periumbilical abdominal wall hernia containing fat. Musculoskeletal: No significant bony findings. Age related degenerative lumbar spondylosis with multilevel disc disease and facet disease. Review of the MIP images confirms the above findings. IMPRESSION: 1. No CT findings for pulmonary embolism. 2. Normal caliber thoracic aorta. 3. Emphysematous changes and pulmonary scarring. 4. Borderline enlarged mediastinal and hilar lymph nodes likely reactive given the lung findings. 5. Patchy areas of ground-glass opacity most notably in the left upper lobe likely representing an inflammatory or atypical infectious process. Follow-up chest CT in 3-4  months may be helpful to reassess the lungs and the mediastinal/hilar lymph nodes. 6. Soft tissue density the and both mainstem bronchi, left larger than right. This is most likely inflammatory debris/mucous. 7. No acute abdominal/pelvic findings, mass lesions or adenopathy. 8. Cholelithiasis. 9. 3.2 x 3.2 cm infrarenal abdominal aortic aneurysm. Recommend follow-up ultrasound every 3 years. 10. Enlarged prostate gland with median lobe hypertrophy impressing on the base of the bladder. Brachytherapy seeds are noted. 11. Aortic atherosclerosis. Aortic Atherosclerosis (ICD10-I70.0) and Emphysema (ICD10-J43.9). CT Electronically Signed   By: Rudie Meyer M.D.   On: 06/11/2022 16:01   CT ABDOMEN PELVIS W CONTRAST  Result Date: 06/11/2022 CLINICAL DATA:  Chest and abdominal pain. EXAM: CT ANGIOGRAPHY CHEST CT ABDOMEN AND PELVIS WITH CONTRAST TECHNIQUE: Multidetector CT imaging of the chest was performed using the standard protocol during bolus administration of intravenous contrast. Multiplanar CT image reconstructions and MIPs were obtained to evaluate the vascular anatomy. Multidetector CT imaging of the abdomen and pelvis was performed using the standard protocol during bolus administration of intravenous contrast. RADIATION DOSE REDUCTION: This exam was performed according to the departmental dose-optimization program which includes automated exposure control, adjustment of the mA and/or kV according to patient size and/or use of iterative reconstruction technique. CONTRAST:  75mL OMNIPAQUE IOHEXOL 350 MG/ML SOLN COMPARISON:  None Available. FINDINGS: CTA CHEST FINDINGS Cardiovascular: The heart is normal in size. No pericardial effusion. The aorta is normal in caliber. No dissection. Scattered atherosclerotic calcification. The branch vessels are patent. Scattered coronary artery calcifications. The pulmonary arterial tree is well opacified. No filling defects to suggest pulmonary embolism. Mediastinum/Nodes:  Numerous borderline mediastinal and hilar lymph nodes. These are likely reactive given the significant underlying lung disease. The esophagus is grossly normal. The thyroid gland is unremarkable. Lungs/Pleura: Emphysematous changes and pulmonary scarring. Patchy  E areas of ground-glass opacity most notably in the left upper lobe likely representing an inflammatory or atypical infectious process. Vague nodularity at the right lung base likely atelectasis. No worrisome pulmonary lesions or nodules. Soft tissue density the and both mainstem bronchi, left larger than right. This is most likely inflammatory debris/mucous. No endobronchial mass is identified. Musculoskeletal: No significant bony findings. Review of the MIP images confirms the above findings. CT ABDOMEN and PELVIS FINDINGS Hepatobiliary: No hepatic lesions or intrahepatic biliary dilatation. Small gallstones are noted the gallbladder. No findings for acute cholecystitis. No common bile duct dilatation. Pancreas: Significant diffuse fatty change involving the pancreas with very little normal residual parenchyma. No inflammation or mass. Spleen: Elongated spleen but no overt splenomegaly. No splenic lesions. Adrenals/Urinary Tract: Adrenal glands and kidneys are unremarkable. No renal lesions, renal calculi or hydronephrosis. The bladder is unremarkable. Stomach/Bowel: The stomach, duodenum, small and colon are unremarkable no acute inflammatory process, mass lesions or obstructive findings. The terminal ileum and appendix are normal. Scattered colonic diverticulosis but no acute diverticulitis. Vascular/Lymphatic: Moderate atherosclerotic calcification involving the aorta and iliac arteries. 3.2 x 3.2 cm infrarenal abdominal aortic aneurysm. Recommend follow-up ultrasound every 3 years. This recommendation follows ACR consensus guidelines: White Paper of the ACR Incidental Findings Committee II on Vascular Findings. J Am Coll Radiol 2013; 10:789-794. No  mesenteric or retroperitoneal mass or adenopathy. Reproductive: Enlarged prostate gland with median lobe hypertrophy impressing on the base of the bladder. Brachytherapy seeds are noted. The seminal vesicles are unremarkable. Other: No pelvic mass or adenopathy. No free pelvic fluid collections. No inguinal mass or adenopathy. Small periumbilical abdominal wall hernia containing fat. Musculoskeletal: No significant bony findings. Age related degenerative lumbar spondylosis with multilevel disc disease and facet disease. Review of the MIP images confirms the above findings. IMPRESSION: 1. No CT findings for pulmonary embolism. 2. Normal caliber thoracic aorta. 3. Emphysematous changes and pulmonary scarring. 4. Borderline enlarged mediastinal and hilar lymph nodes likely reactive given the lung findings. 5. Patchy areas of ground-glass opacity most notably in the left upper lobe likely representing an inflammatory or atypical infectious process. Follow-up chest CT in 3-4 months may be helpful to reassess the lungs and the mediastinal/hilar lymph nodes. 6. Soft tissue density the and both mainstem bronchi, left larger than right. This is most likely inflammatory debris/mucous. 7. No acute abdominal/pelvic findings, mass lesions or adenopathy. 8. Cholelithiasis. 9. 3.2 x 3.2 cm infrarenal abdominal aortic aneurysm. Recommend follow-up ultrasound every 3 years. 10. Enlarged prostate gland with median lobe hypertrophy impressing on the base of the bladder. Brachytherapy seeds are noted. 11. Aortic atherosclerosis. Aortic Atherosclerosis (ICD10-I70.0) and Emphysema (ICD10-J43.9). CT Electronically Signed   By: Rudie Meyer M.D.   On: 06/11/2022 16:01   US Abdomen Limited RUQ (LIVER/GB)  Result Date: 06/11/2022 CLINICAL DATA:  Right upper quadrant pain for 2 days EXAM: ULTRASOUND ABDOMEN LIMITED RIGHT UPPER QUADRANT COMPARISON:  None Available. FINDINGS: Gallbladder: No wall thickening. There are a few stones  measuring up to 0.6 cm. No sonographic Murphy sign noted by sonographer. Common bile duct: Diameter: 0.6 cm, within normal limits Liver: No focal lesion identified. Within normal limits in parenchymal echogenicity. Portal vein is patent on color Doppler imaging with normal direction of blood flow towards the liver. Other: None. IMPRESSION: Cholelithiasis without sonographic evidence of acute cholecystitis. Electronically Signed   By: Emmaline Kluver M.D.   On: 06/11/2022 14:40   DG Chest 2 View  Result Date: 06/11/2022 CLINICAL DATA:  Hypoxia. EXAM:  CHEST - 2 VIEW COMPARISON:  X-ray 10/30/2021 FINDINGS: Hyperinflation. Chronic interstitial lung changes. No consolidation, pneumothorax. Slight blunting of the posteroinferior costophrenic angles. Tiny effusion versus pleural thickening. Normal cardiopericardial silhouette without edema. Fixation hardware seen of the lower cervical spine at the edge of the imaging field. Degenerative changes of the thoracic spine. IMPRESSION: Hyperinflation with chronic lung changes. Tiny effusion versus pleural thickening bilaterally. No consolidation. Electronically Signed   By: Karen Kays M.D.   On: 06/11/2022 13:28   CT Head Wo Contrast  Result Date: 06/11/2022 CLINICAL DATA:  TIA EXAM: CT HEAD WITHOUT CONTRAST TECHNIQUE: Contiguous axial images were obtained from the base of the skull through the vertex without intravenous contrast. RADIATION DOSE REDUCTION: This exam was performed according to the departmental dose-optimization program which includes automated exposure control, adjustment of the mA and/or kV according to patient size and/or use of iterative reconstruction technique. COMPARISON:  10/30/21 CT Head FINDINGS: Brain: No evidence of acute infarction, hemorrhage, hydrocephalus, extra-axial collection or mass lesion/mass effect. Sequela of mild chronic microvascular ischemic change Vascular: No hyperdense vessel or unexpected calcification. Skull: Normal.  Negative for fracture or focal lesion. Sinuses/Orbits: No middle ear or mastoid effusion. Paranasal sinuses are clear. Right lens replacement. Orbits otherwise unremarkable. Other: None. IMPRESSION: No acute intracranial abnormality. Electronically Signed   By: Lorenza Cambridge M.D.   On: 06/11/2022 13:13   CT Angio Chest PE W and/or Wo Contrast  Final Result    CT ABDOMEN PELVIS W CONTRAST  Final Result    US Abdomen Limited RUQ (LIVER/GB)  Final Result    DG Chest 2 View  Final Result    CT Head Wo Contrast  Final Result       EKG: Independently reviewed. Afib with RVR   Lewie Chamber, MD [facility] 06/12/2022, 1:31 AM

## 2022-06-12 NOTE — Evaluation (Signed)
Physical Therapy Evaluation Patient Details Name: Dustin [name] MRN: [id number] DOB: 08/26/1940 Today's Date: 06/12/2022  History of Present Illness  82 yo male admitted with acute resp failure with hypoxa. Hx of BPH, memory loss, neuropathy, depression, Afib, cardioversion, ataxia, falls, HF, emphysema, IM nail 2022, insomnia, UTI, metabolic encephalopathy  Clinical Impression  On eval, pt was Supervision level for mobility. He walked ~50 feet with his quad cane. O2 87% on RA with ambulation, 94% on RA at rest (end of session). Will plan to follow and progress activity as tolerated. Could benefit from post acute rehab training for balance if pt is agreeable.        Recommendations for follow up therapy are one component of a multi-disciplinary discharge planning process, led by the attending physician.  Recommendations may be updated based on patient status, additional functional criteria and insurance authorization.  Follow Up Recommendations       Assistance Recommended at Discharge Intermittent Supervision/Assistance  Patient can return home with the following       Equipment Recommendations None recommended by PT (pt reports he has access to walkers at home)  Recommendations for Other Services       Functional Status Assessment Patient has had a recent decline in their functional status and demonstrates the ability to make significant improvements in function in a reasonable and predictable amount of time.     Precautions / Restrictions Precautions Precautions: Fall Precaution Comments: monitor O2 Restrictions Weight Bearing Restrictions: No      Mobility  Bed Mobility Overal bed mobility: Modified Independent                  Transfers Overall transfer level: Modified independent Equipment used: Quad cane                    Ambulation/Gait Ambulation/Gait assistance: Supervision Gait Distance (Feet): 50 Feet Assistive device: Quad cane Gait  Pattern/deviations: Step-through pattern, Decreased stride length       General Gait Details: Pt does not use quad cane correctly (uses in his preferred manner). No LOB with quad cane use. O2 87% on RA with short distance ambulation.  Stairs            Wheelchair Mobility    Modified Rankin (Stroke Patients Only)       Balance Overall balance assessment: History of Falls             Standing balance comment: Had pt perform static standing with EO vs EC-loss of balance with EC; narrow BOS-unable to get into position without holding on to bedrail/could not maintain without holding on to bedrail                             Pertinent Vitals/Pain Pain Assessment Pain Assessment: No/denies pain    Home Living Family/patient expects to be discharged to:: Private residence Living Arrangements: Alone Available Help at Discharge: Family;Available PRN/intermittently Type of Home: House Home Access: Stairs to enter Entrance Stairs-Rails: Right;Left;Can reach both Entrance Stairs-Number of Steps: 3   Home Layout: Two level;Able to live on main level with bedroom/bathroom Home Equipment: Grab bars - toilet;Grab bars - tub/shower;Hand held shower head;Shower seat;BSC/3in1;Rolling Environmental consultant (2 wheels);Cane - single point Additional Comments: son and dtr get togethers every couple weeks. They call 2-3 times a week. Housekeeper every 3 weeks.    Prior Function Prior Level of Function : Independent/Modified Independent  Mobility Comments: Uses cane at times when out, "furniture cruises" as needed inside home       Hand Dominance   Dominant Hand: Left    Extremity/Trunk Assessment   Upper Extremity Assessment Upper Extremity Assessment: Overall WFL for tasks assessed    Lower Extremity Assessment Lower Extremity Assessment: Generalized weakness    Cervical / Trunk Assessment Cervical / Trunk Assessment: Normal  Communication   Communication:  HOH  Cognition Arousal/Alertness: Awake/alert Behavior During Therapy: WFL for tasks assessed/performed Overall Cognitive Status: Within Functional Limits for tasks assessed                                          General Comments      Exercises     Assessment/Plan    PT Assessment Patient needs continued PT services  PT Problem List Decreased balance;Decreased activity tolerance       PT Treatment Interventions DME instruction;Gait training;Balance training;Therapeutic activities;Patient/family education;Functional mobility training    PT Goals (Current goals can be found in the Care Plan section)  Acute Rehab PT Goals Patient Stated Goal: home soon PT Goal Formulation: With patient Time For Goal Achievement: 06/26/22 Potential to Achieve Goals: Good    Frequency Min 1X/week     Co-evaluation               AM-PAC PT "6 Clicks" Mobility  Outcome Measure Help needed turning from your back to your side while in a flat bed without using bedrails?: None Help needed moving from lying on your back to sitting on the side of a flat bed without using bedrails?: None Help needed moving to and from a bed to a chair (including a wheelchair)?: None Help needed standing up from a chair using your arms (e.g., wheelchair or bedside chair)?: None Help needed to walk in hospital room?: A Little Help needed climbing 3-5 steps with a railing? : A Little 6 Click Score: 22    End of Session Equipment Utilized During Treatment: Gait belt Activity Tolerance: Patient tolerated treatment well Patient left: in bed;with call bell/phone within reach   PT Visit Diagnosis: Difficulty in walking, not elsewhere classified (R26.2)    Time: 9811-9147 PT Time Calculation (min) (ACUTE ONLY): 10 min   Charges:   PT Evaluation $PT Eval Low Complexity: 1 Low            Faye Ramsay, PT Acute Rehabilitation  Office: [phone]

## 2022-06-12 NOTE — Progress Notes (Signed)
RT instructed patient on the use of flutter valve. Patient able to demonstrate back good technique.   

## 2022-06-12 NOTE — Progress Notes (Signed)
PROGRESS NOTE    Dustin [name]  ZOX:[id number] DOB: 02/06/1941 DOA: 06/11/2022 PCP: Darrow Bussing, MD   Brief Narrative:  Mr. Hauk is an 82 yo male with PMH afib, chronic combined systolic/diastolic CHF, BPH, GERD, insomnia, neuropathy who originally presented to [facility] with right-sided abdominal pain and reports dark urine.  At intake he was noted to be moderately hypoxic with sats in the 80s possible left upper lobe atypical infectious process on imaging.  Admitted for acute hypoxic respiratory failure secondary to community acquired pneumonia.  Abdominal imaging did confirm cholelithiasis without overt signs of cholecystitis.   Assessment & Plan:   Principal Problem:   Acute respiratory failure with hypoxia Active Problems:   Abdominal pain   Persistent atrial fibrillation   AAA (abdominal aortic aneurysm)   Depression   GERD (gastroesophageal reflux disease)   BPH (benign prostatic hyperplasia)   Chronic combined systolic (congestive) and diastolic (congestive) heart failure (HCC)   Emphysema of lung  Acute respiratory failure with hypoxia -Likely secondary to atypical community-acquired pneumonia -PE ruled out -Continue antibiotics, follow cultures -Procalcitonin negative low threshold to discontinue antibiotics -Patient does have 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  At this time no indication for surgical consult given symptoms have resolved.   Persistent atrial fibrillation, rate controlled - Resume home Lopressor and Eliquis - PRN Lopressor for  episodes of RVR   Abdominal aortic aneurysm - per CT: "3.2 x 3.2 cm infrarenal abdominal aortic aneurysm. Recommend follow-up ultrasound every 3 years."   Emphysema of lung -Clinically without symptoms, see hypoxia as above   Chronic combined systolic and diastolic heart failure  -Followed by cardiology.  Last echo July 2022 showed improvement -EF 55%, indeterminate diastolic parameters -Continue Farxiga, Lopressor   Benign prostatic hyperplasia - Continue Flomax (substituted for rapaflo) - per CT: "Enlarged prostate gland with median lobe hypertrophy impressing on the base of the bladder. Brachytherapy seeds are noted." -Asymptomatic   Gastroesophageal reflux disease - Continue Protonix   Depression - Continue Cymbalta and Seroquel  DVT prophylaxis: Eliquis Code Status: DNR Family Communication: None present  Status is: Inpatient  Dispo: The patient is from: Home              Anticipated d/c is to: Home              Anticipated d/c date is: 24 to 48 hours              Patient currently not medically stable for discharge given ongoing hypoxia from baseline profound symptoms secondary to above  Consultants:  None  Procedures:  None  Antimicrobials:  Azithromycin, ceftriaxone  Subjective: No acute issues or events overnight, respiratory status continues to be profoundly diminished from baseline but improving.  Otherwise denies nausea vomiting diarrhea constipation headache fevers chills or chest pain.  Objective: Vitals:   06/11/22 2321 06/12/22 0004 06/12/22 0439 06/12/22 0759  BP: 121/64 124/76 104/73 133/77  Pulse:  100 81   Resp:  Temp:  97.7 F (36.5 C) (!) 100.7 F (38.2 C) 98.3 F (36.8 C)  TempSrc:  Oral Oral Oral  SpO2:  91% 94% 97%  Weight:  102.5 kg    Height:  6\' 2"  (1.88 m)     No intake or output data in the 24 hours ending 06/12/22 0812 Filed Weights   06/11/22 1201 06/12/22 0004  Weight: 90.7 kg 102.5 kg     Examination:  General exam: Appears calm and comfortable  Respiratory system: Clear to auscultation. Respiratory effort normal. Cardiovascular system: S1 & S2 heard, RRR. No JVD, murmurs, rubs, gallops or clicks. No pedal edema. Gastrointestinal system: Abdomen is nondistended, soft and nontender. No organomegaly or masses felt. Normal bowel sounds heard. Central nervous system: Alert and oriented. No focal neurological deficits. Extremities: Symmetric 5 x 5 power. Skin: No rashes, lesions or ulcers Psychiatry: Judgement and insight appear normal. Mood & affect appropriate.   Data Reviewed: I have personally reviewed following labs and imaging studies  CBC: Recent Labs  Lab 06/11/22 1204 06/12/22 0510  WBC 7.3 4.3  NEUTROABS  --  2.5  HGB 11.2* 9.9*  HCT 34.6* 31.4*  MCV 94.8 96.0  PLT 144* 140*   Basic Metabolic Panel: Recent Labs  Lab 06/11/22 1204 06/12/22 0510  NA 134* 135  K 4.5 4.1  CL 97* 100  CO2 29 28  GLUCOSE 118* 106*  BUN 21 16  CREATININE 1.76* 1.46*  CALCIUM 9.1 8.2*  MG  --  2.1   GFR: Estimated Creatinine Clearance: 51.5 mL/min (A) (by C-G formula based on SCr of 1.46 mg/dL (H)). Liver Function Tests: Recent Labs  Lab 06/11/22 1204  AST 35  ALT 12  ALKPHOS 74  BILITOT 0.9  PROT 7.0  ALBUMIN 4.2   Recent Labs  Lab 06/11/22 1204  LIPASE <10*   No results for input(s): "AMMONIA" in the last 168 hours. Coagulation Profile: No results for input(s): "INR", "PROTIME" in the last 168 hours. Cardiac Enzymes: No results for input(s): "CKTOTAL", "CKMB", "CKMBINDEX", "TROPONINI" in the last 168 hours. BNP (last 3 results) No results for input(s): "PROBNP" in the last 8760 hours. HbA1C: No results for input(s): "HGBA1C" in the last 72 hours. CBG: No results for input(s): "GLUCAP" in the last 168 hours. Lipid Profile: No results for input(s): "CHOL", "HDL", "LDLCALC", "TRIG", "CHOLHDL", "LDLDIRECT" in the last 72 hours. Thyroid Function  Tests: No results for input(s): "TSH", "T4TOTAL", "FREET4", "T3FREE", "THYROIDAB" in the last 72 hours. Anemia Panel: No results for input(s): "VITAMINB12", "FOLATE", "FERRITIN", "TIBC", "IRON", "RETICCTPCT" in the last 72 hours. Sepsis Labs: Recent Labs  Lab 06/12/22 0510  PROCALCITON <0.10    Recent Results (from the past 240 hour(s))  Resp panel by RT-PCR (RSV, Flu A&B, Covid) Anterior Nasal Swab     Status: None   Collection Time: 06/11/22  4:41 PM   Specimen: Anterior Nasal Swab  Result Value Ref Range Status   SARS Coronavirus 2 by RT PCR NEGATIVE NEGATIVE Final    Comment: (NOTE) SARS-CoV-2 target nucleic acids are NOT DETECTED.  The SARS-CoV-2 RNA is generally detectable in upper respiratory specimens during the acute phase of infection. The lowest concentration of SARS-CoV-2 viral copies this assay can detect is 138 copies/mL. A negative result does not preclude SARS-Cov-2 infection and should not be used as the sole basis for treatment or other patient management decisions. A negative result may occur with  improper specimen collection/handling, submission of specimen other than nasopharyngeal swab, presence of viral mutation(s) within the areas targeted by this assay, and inadequate number of viral copies(<138 copies/mL). A negative result must be combined with  clinical observations, patient history, and epidemiological information. The expected result is Negative.  Fact Sheet for Patients:  BloggerCourse.com  Fact Sheet for Healthcare Providers:  SeriousBroker.it  This test is no t yet approved or cleared by the [place] FDA and  has been authorized for detection and/or diagnosis of SARS-CoV-2 by FDA under an Emergency Use Authorization (EUA). This EUA will remain  in effect (meaning this test can be used) for the duration of the COVID-19 declaration under Section 564(b)(1) of the Act, 21 U.S.C.section  360bbb-3(b)(1), unless the authorization is terminated  or revoked sooner.       Influenza A by PCR NEGATIVE NEGATIVE Final   Influenza B by PCR NEGATIVE NEGATIVE Final    Comment: (NOTE) The Xpert Xpress SARS-CoV-2/FLU/RSV plus assay is intended as an aid in the diagnosis of influenza from Nasopharyngeal swab specimens and should not be used as a sole basis for treatment. Nasal washings and aspirates are unacceptable for Xpert Xpress SARS-CoV-2/FLU/RSV testing.  Fact Sheet for Patients: BloggerCourse.com  Fact Sheet for Healthcare Providers: SeriousBroker.it  This test is not yet approved or cleared by the [place] FDA and has been authorized for detection and/or diagnosis of SARS-CoV-2 by FDA under an Emergency Use Authorization (EUA). This EUA will remain in effect (meaning this test can be used) for the duration of the COVID-19 declaration under Section 564(b)(1) of the Act, 21 U.S.C. section 360bbb-3(b)(1), unless the authorization is terminated or revoked.     Resp Syncytial Virus by PCR NEGATIVE NEGATIVE Final    Comment: (NOTE) Fact Sheet for Patients: BloggerCourse.com  Fact Sheet for Healthcare Providers: SeriousBroker.it  This test is not yet approved or cleared by the [place] FDA and has been authorized for detection and/or diagnosis of SARS-CoV-2 by FDA under an Emergency Use Authorization (EUA). This EUA will remain in effect (meaning this test can be used) for the duration of the COVID-19 declaration under Section 564(b)(1) of the Act, 21 U.S.C. section 360bbb-3(b)(1), unless the authorization is terminated or revoked.  Performed at [facility], [address], [place], [place] [address]   Respiratory (~20 pathogens) panel by PCR     Status: None   Collection Time: 06/12/22  1:13 AM   Specimen: Nasopharyngeal Swab;  Respiratory  Result Value Ref Range Status   Adenovirus NOT DETECTED NOT DETECTED Final   Coronavirus 229E NOT DETECTED NOT DETECTED Final    Comment: (NOTE) The Coronavirus on the Respiratory Panel, DOES NOT test for the novel  Coronavirus (2019 nCoV)    Coronavirus HKU1 NOT DETECTED NOT DETECTED Final   Coronavirus NL63 NOT DETECTED NOT DETECTED Final   Coronavirus OC43 NOT DETECTED NOT DETECTED Final   Metapneumovirus NOT DETECTED NOT DETECTED Final   Rhinovirus / Enterovirus NOT DETECTED NOT DETECTED Final   Influenza A NOT DETECTED NOT DETECTED Final   Influenza B NOT DETECTED NOT DETECTED Final   Parainfluenza Virus 1 NOT DETECTED NOT DETECTED Final   Parainfluenza Virus 2 NOT DETECTED NOT DETECTED Final   Parainfluenza Virus 3 NOT DETECTED NOT DETECTED Final   Parainfluenza Virus 4 NOT DETECTED NOT DETECTED Final   Respiratory Syncytial Virus NOT DETECTED NOT DETECTED Final   Bordetella pertussis NOT DETECTED NOT DETECTED Final   Bordetella Parapertussis NOT DETECTED NOT DETECTED Final   Chlamydophila pneumoniae NOT DETECTED NOT DETECTED Final   Mycoplasma pneumoniae NOT DETECTED NOT DETECTED Final    Comment: Performed at [facility], [address] [place], [place] [address]  Radiology Studies: CT Angio Chest PE W and/or Wo Contrast  Result Date: 06/11/2022 CLINICAL DATA:  Chest and abdominal pain. EXAM: CT ANGIOGRAPHY CHEST CT ABDOMEN AND PELVIS WITH CONTRAST TECHNIQUE: Multidetector CT imaging of the chest was performed using the standard protocol during bolus administration of intravenous contrast. Multiplanar CT image reconstructions and MIPs were obtained to evaluate the vascular anatomy. Multidetector CT imaging of the abdomen and pelvis was performed using the standard protocol during bolus administration of intravenous contrast. RADIATION DOSE REDUCTION: This exam was performed according to the departmental dose-optimization program which includes  automated exposure control, adjustment of the mA and/or kV according to patient size and/or use of iterative reconstruction technique. CONTRAST:  75mL OMNIPAQUE IOHEXOL 350 MG/ML SOLN COMPARISON:  None Available. FINDINGS: CTA CHEST FINDINGS Cardiovascular: The heart is normal in size. No pericardial effusion. The aorta is normal in caliber. No dissection. Scattered atherosclerotic calcification. The branch vessels are patent. Scattered coronary artery calcifications. The pulmonary arterial tree is well opacified. No filling defects to suggest pulmonary embolism. Mediastinum/Nodes: Numerous borderline mediastinal and hilar lymph nodes. These are likely reactive given the significant underlying lung disease. The esophagus is grossly normal. The thyroid gland is unremarkable. Lungs/Pleura: Emphysematous changes and pulmonary scarring. Patchy E areas of ground-glass opacity most notably in the left upper lobe likely representing an inflammatory or atypical infectious process. Vague nodularity at the right lung base likely atelectasis. No worrisome pulmonary lesions or nodules. Soft tissue density the and both mainstem bronchi, left larger than right. This is most likely inflammatory debris/mucous. No endobronchial mass is identified. Musculoskeletal: No significant bony findings. Review of the MIP images confirms the above findings. CT ABDOMEN and PELVIS FINDINGS Hepatobiliary: No hepatic lesions or intrahepatic biliary dilatation. Small gallstones are noted the gallbladder. No findings for acute cholecystitis. No common bile duct dilatation. Pancreas: Significant diffuse fatty change involving the pancreas with very little normal residual parenchyma. No inflammation or mass. Spleen: Elongated spleen but no overt splenomegaly. No splenic lesions. Adrenals/Urinary Tract: Adrenal glands and kidneys are unremarkable. No renal lesions, renal calculi or hydronephrosis. The bladder is unremarkable. Stomach/Bowel: The  stomach, duodenum, small and colon are unremarkable no acute inflammatory process, mass lesions or obstructive findings. The terminal ileum and appendix are normal. Scattered colonic diverticulosis but no acute diverticulitis. Vascular/Lymphatic: Moderate atherosclerotic calcification involving the aorta and iliac arteries. 3.2 x 3.2 cm infrarenal abdominal aortic aneurysm. Recommend follow-up ultrasound every 3 years. This recommendation follows ACR consensus guidelines: White Paper of the ACR Incidental Findings Committee II on Vascular Findings. J Am Coll Radiol 2013; 10:789-794. No mesenteric or retroperitoneal mass or adenopathy. Reproductive: Enlarged prostate gland with median lobe hypertrophy impressing on the base of the bladder. Brachytherapy seeds are noted. The seminal vesicles are unremarkable. Other: No pelvic mass or adenopathy. No free pelvic fluid collections. No inguinal mass or adenopathy. Small periumbilical abdominal wall hernia containing fat. Musculoskeletal: No significant bony findings. Age related degenerative lumbar spondylosis with multilevel disc disease and facet disease. Review of the MIP images confirms the above findings. IMPRESSION: 1. No CT findings for pulmonary embolism. 2. Normal caliber thoracic aorta. 3. Emphysematous changes and pulmonary scarring. 4. Borderline enlarged mediastinal and hilar lymph nodes likely reactive given the lung findings. 5. Patchy areas of ground-glass opacity most notably in the left upper lobe likely representing an inflammatory or atypical infectious process. Follow-up chest CT in 3-4 months may be helpful to reassess the lungs and the mediastinal/hilar lymph nodes. 6. Soft tissue density the and  both mainstem bronchi, left larger than right. This is most likely inflammatory debris/mucous. 7. No acute abdominal/pelvic findings, mass lesions or adenopathy. 8. Cholelithiasis. 9. 3.2 x 3.2 cm infrarenal abdominal aortic aneurysm. Recommend follow-up  ultrasound every 3 years. 10. Enlarged prostate gland with median lobe hypertrophy impressing on the base of the bladder. Brachytherapy seeds are noted. 11. Aortic atherosclerosis. Aortic Atherosclerosis (ICD10-I70.0) and Emphysema (ICD10-J43.9). CT Electronically Signed   By: Rudie Meyer M.D.   On: 06/11/2022 16:01   CT ABDOMEN PELVIS W CONTRAST  Result Date: 06/11/2022 CLINICAL DATA:  Chest and abdominal pain. EXAM: CT ANGIOGRAPHY CHEST CT ABDOMEN AND PELVIS WITH CONTRAST TECHNIQUE: Multidetector CT imaging of the chest was performed using the standard protocol during bolus administration of intravenous contrast. Multiplanar CT image reconstructions and MIPs were obtained to evaluate the vascular anatomy. Multidetector CT imaging of the abdomen and pelvis was performed using the standard protocol during bolus administration of intravenous contrast. RADIATION DOSE REDUCTION: This exam was performed according to the departmental dose-optimization program which includes automated exposure control, adjustment of the mA and/or kV according to patient size and/or use of iterative reconstruction technique. CONTRAST:  75mL OMNIPAQUE IOHEXOL 350 MG/ML SOLN COMPARISON:  None Available. FINDINGS: CTA CHEST FINDINGS Cardiovascular: The heart is normal in size. No pericardial effusion. The aorta is normal in caliber. No dissection. Scattered atherosclerotic calcification. The branch vessels are patent. Scattered coronary artery calcifications. The pulmonary arterial tree is well opacified. No filling defects to suggest pulmonary embolism. Mediastinum/Nodes: Numerous borderline mediastinal and hilar lymph nodes. These are likely reactive given the significant underlying lung disease. The esophagus is grossly normal. The thyroid gland is unremarkable. Lungs/Pleura: Emphysematous changes and pulmonary scarring. Patchy E areas of ground-glass opacity most notably in the left upper lobe likely representing an inflammatory or  atypical infectious process. Vague nodularity at the right lung base likely atelectasis. No worrisome pulmonary lesions or nodules. Soft tissue density the and both mainstem bronchi, left larger than right. This is most likely inflammatory debris/mucous. No endobronchial mass is identified. Musculoskeletal: No significant bony findings. Review of the MIP images confirms the above findings. CT ABDOMEN and PELVIS FINDINGS Hepatobiliary: No hepatic lesions or intrahepatic biliary dilatation. Small gallstones are noted the gallbladder. No findings for acute cholecystitis. No common bile duct dilatation. Pancreas: Significant diffuse fatty change involving the pancreas with very little normal residual parenchyma. No inflammation or mass. Spleen: Elongated spleen but no overt splenomegaly. No splenic lesions. Adrenals/Urinary Tract: Adrenal glands and kidneys are unremarkable. No renal lesions, renal calculi or hydronephrosis. The bladder is unremarkable. Stomach/Bowel: The stomach, duodenum, small and colon are unremarkable no acute inflammatory process, mass lesions or obstructive findings. The terminal ileum and appendix are normal. Scattered colonic diverticulosis but no acute diverticulitis. Vascular/Lymphatic: Moderate atherosclerotic calcification involving the aorta and iliac arteries. 3.2 x 3.2 cm infrarenal abdominal aortic aneurysm. Recommend follow-up ultrasound every 3 years. This recommendation follows ACR consensus guidelines: White Paper of the ACR Incidental Findings Committee II on Vascular Findings. J Am Coll Radiol 2013; 10:789-794. No mesenteric or retroperitoneal mass or adenopathy. Reproductive: Enlarged prostate gland with median lobe hypertrophy impressing on the base of the bladder. Brachytherapy seeds are noted. The seminal vesicles are unremarkable. Other: No pelvic mass or adenopathy. No free pelvic fluid collections. No inguinal mass or adenopathy. Small periumbilical abdominal wall hernia  containing fat. Musculoskeletal: No significant bony findings. Age related degenerative lumbar spondylosis with multilevel disc disease and facet disease. Review of the MIP images confirms the  above findings. IMPRESSION: 1. No CT findings for pulmonary embolism. 2. Normal caliber thoracic aorta. 3. Emphysematous changes and pulmonary scarring. 4. Borderline enlarged mediastinal and hilar lymph nodes likely reactive given the lung findings. 5. Patchy areas of ground-glass opacity most notably in the left upper lobe likely representing an inflammatory or atypical infectious process. Follow-up chest CT in 3-4 months may be helpful to reassess the lungs and the mediastinal/hilar lymph nodes. 6. Soft tissue density the and both mainstem bronchi, left larger than right. This is most likely inflammatory debris/mucous. 7. No acute abdominal/pelvic findings, mass lesions or adenopathy. 8. Cholelithiasis. 9. 3.2 x 3.2 cm infrarenal abdominal aortic aneurysm. Recommend follow-up ultrasound every 3 years. 10. Enlarged prostate gland with median lobe hypertrophy impressing on the base of the bladder. Brachytherapy seeds are noted. 11. Aortic atherosclerosis. Aortic Atherosclerosis (ICD10-I70.0) and Emphysema (ICD10-J43.9). CT Electronically Signed   By: Rudie Meyer M.D.   On: 06/11/2022 16:01   US Abdomen Limited RUQ (LIVER/GB)  Result Date: 06/11/2022 CLINICAL DATA:  Right upper quadrant pain for 2 days EXAM: ULTRASOUND ABDOMEN LIMITED RIGHT UPPER QUADRANT COMPARISON:  None Available. FINDINGS: Gallbladder: No wall thickening. There are a few stones measuring up to 0.6 cm. No sonographic Murphy sign noted by sonographer. Common bile duct: Diameter: 0.6 cm, within normal limits Liver: No focal lesion identified. Within normal limits in parenchymal echogenicity. Portal vein is patent on color Doppler imaging with normal direction of blood flow towards the liver. Other: None. IMPRESSION: Cholelithiasis without sonographic  evidence of acute cholecystitis. Electronically Signed   By: Emmaline Kluver M.D.   On: 06/11/2022 14:40   DG Chest 2 View  Result Date: 06/11/2022 CLINICAL DATA:  Hypoxia. EXAM: CHEST - 2 VIEW COMPARISON:  X-ray 10/30/2021 FINDINGS: Hyperinflation. Chronic interstitial lung changes. No consolidation, pneumothorax. Slight blunting of the posteroinferior costophrenic angles. Tiny effusion versus pleural thickening. Normal cardiopericardial silhouette without edema. Fixation hardware seen of the lower cervical spine at the edge of the imaging field. Degenerative changes of the thoracic spine. IMPRESSION: Hyperinflation with chronic lung changes. Tiny effusion versus pleural thickening bilaterally. No consolidation. Electronically Signed   By: Karen Kays M.D.   On: 06/11/2022 13:28   CT Head Wo Contrast  Result Date: 06/11/2022 CLINICAL DATA:  TIA EXAM: CT HEAD WITHOUT CONTRAST TECHNIQUE: Contiguous axial images were obtained from the base of the skull through the vertex without intravenous contrast. RADIATION DOSE REDUCTION: This exam was performed according to the departmental dose-optimization program which includes automated exposure control, adjustment of the mA and/or kV according to patient size and/or use of iterative reconstruction technique. COMPARISON:  10/30/21 CT Head FINDINGS: Brain: No evidence of acute infarction, hemorrhage, hydrocephalus, extra-axial collection or mass lesion/mass effect. Sequela of mild chronic microvascular ischemic change Vascular: No hyperdense vessel or unexpected calcification. Skull: Normal. Negative for fracture or focal lesion. Sinuses/Orbits: No middle ear or mastoid effusion. Paranasal sinuses are clear. Right lens replacement. Orbits otherwise unremarkable. Other: None. IMPRESSION: No acute intracranial abnormality. Electronically Signed   By: Lorenza Cambridge M.D.   On: 06/11/2022 13:13        Scheduled Meds:  apixaban  5 mg Oral BID   azithromycin  500 mg  Oral Daily   dapagliflozin propanediol  10 mg Oral Daily   DULoxetine  60 mg Oral Daily   ferrous sulfate  325 mg Oral Q breakfast   gabapentin  600 mg Oral TID   guaiFENesin  1,200 mg Oral BID   metoprolol tartrate  25 mg Oral BID   pantoprazole  40 mg Oral Daily   QUEtiapine  600 mg Oral QHS   tamsulosin  0.4 mg Oral Daily   Continuous Infusions:  cefTRIAXone (ROCEPHIN)  IV       LOS: 1 day   Time spent:  Azucena Fallen, DO [facility]  If 7PM-7AM, please contact night-coverage [email or web address]  06/12/2022, 8:12 AM

## 2022-06-12 NOTE — Assessment & Plan Note (Signed)
-   per CT: "3.2 x 3.2 cm infrarenal abdominal aortic aneurysm. Recommend follow-up ultrasound every 3 years."

## 2022-06-13 DIAGNOSIS — J9601 Acute respiratory failure with hypoxia: Secondary | ICD-10-CM | POA: Diagnosis not present

## 2022-06-13 LAB — BASIC METABOLIC PANEL
Anion gap: 9 (ref 5–15)
BUN: 12 mg/dL (ref 8–23)
CO2: 26 mmol/L (ref 22–32)
Calcium: 8.4 mg/dL — ABNORMAL LOW (ref 8.9–10.3)
Chloride: 99 mmol/L (ref 98–111)
Creatinine, Ser: 1.4 mg/dL — ABNORMAL HIGH (ref 0.61–1.24)
GFR, Estimated: 51 mL/min — ABNORMAL LOW (ref >60)
Glucose, Bld: 110 mg/dL — ABNORMAL HIGH (ref 70–99)
Potassium: 3.8 mmol/L (ref 3.5–5.1)
Sodium: 134 mmol/L — ABNORMAL LOW (ref 135–145)

## 2022-06-13 LAB — CBC
HCT: 33.4 % — ABNORMAL LOW (ref 39.0–52.0)
Hemoglobin: 10.9 g/dL — ABNORMAL LOW (ref 13.0–17.0)
MCH: 31.1 pg (ref 26.0–34.0)
MCHC: 32.6 g/dL (ref 30.0–36.0)
MCV: 95.2 fL (ref 80.0–100.0)
Platelets: 148 10*3/uL — ABNORMAL LOW (ref 150–400)
RBC: 3.51 MIL/uL — ABNORMAL LOW (ref 4.22–5.81)
RDW: 12.6 % (ref 11.5–15.5)
WBC: 4.8 10*3/uL (ref 4.0–10.5)
nRBC: 0 % (ref 0.0–0.2)

## 2022-06-13 MED ORDER — FERROUS SULFATE 325 (65 FE) MG PO TABS: 325.0000 mg | ORAL_TABLET | Freq: Every day | ORAL | 0 refills | Status: DC

## 2022-06-13 MED ORDER — AZITHROMYCIN 250 MG PO TABS: ORAL_TABLET | ORAL | 0 refills | Status: DC

## 2022-06-13 NOTE — Discharge Summary (Signed)
Physician Discharge Summary  Dustin [name] ZOX:[id number] DOB: 12-28-1940 DOA: 06/11/2022  PCP: Darrow Bussing, MD  Admit date: 06/11/2022 Discharge date: 06/13/2022  Admitted From: Home Disposition: Home  Recommendations for Outpatient Follow-up:  Follow up with PCP in 1-2 weeks Follow-up with surgery as indicated, if abdominal pain/symptoms worsen return to the hospital  Home Health: None Equipment/Devices: None  Discharge Condition: Stable CODE STATUS: DNR Diet recommendation: Low-salt low-fat low-carb diet  Brief/Interim Summary: Dustin [name] is an 82 yo male with PMH afib, chronic combined systolic/diastolic CHF, BPH, GERD, insomnia, neuropathy who originally presented to [facility] with right-sided abdominal pain and reports dark urine. At intake he was noted to be moderately hypoxic with sats in the 80s possible left upper lobe atypical infectious process on imaging. Admitted for acute hypoxic respiratory failure secondary to community acquired pneumonia. Abdominal imaging did confirm cholelithiasis without overt signs of cholecystitis.   Patient admitted for acute hypoxic respiratory failure concerning for possible atypical pneumonia versus emphysema exacerbation.  At this time he is currently well-controlled ambulating without hypoxia or symptoms.  Will discharge on remainder of azithromycin.  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  Interestingly patient initially presented to the ED for right upper quadrant abdominal pain, this is not postprandial pain has never noted this pain to be present before, imaging was positive for cholelithiasis but otherwise was unremarkable.  Negative Murphy sign.  Concern patient has a "ball valving" cholelithiasis during positional changes with eating however he has had no issues here and has consumed multiple meals without complication.   Discussed that he may need further evaluation in the outpatient setting but certainly no urgent or emergent surgical intervention required at this time.  Discharge Diagnoses:  Principal Problem:   Acute respiratory failure with hypoxia Active Problems:   Abdominal pain   Persistent atrial fibrillation   AAA (abdominal aortic aneurysm)   Depression   GERD (gastroesophageal reflux disease)   BPH (benign prostatic hyperplasia)   Chronic combined systolic (congestive) and diastolic (congestive) heart failure (HCC)   Emphysema of lung  Acute respiratory failure with hypoxia -Likely secondary to atypical community-acquired pneumonia -PE ruled out -Continue antibiotics, discussed PPE in the woodshop   Emphysema of lung -Questionably in exacerbation secondary to above atypical pneumonia versus reactive airway disease  Abdominal pain Likely secondary to cholelithiasis -Imaging negative for cholecystitis -symptoms have yet to recur   Persistent atrial fibrillation, rate controlled -Continue home Lopressor Eliquis   Abdominal aortic aneurysm - per radiology: "3.2 x 3.2 cm infrarenal abdominal aortic aneurysm. Recommend follow-up ultrasound every 3 years."  Chronic combined systolic and diastolic heart failure  -Followed by cardiology.  Last echo July 2022 showed improvement -EF 55%, indeterminate diastolic parameters -Continue Farxiga, Lopressor   Benign prostatic hyperplasia - Continue Flomax (substituted for rapaflo) - per CT: "Enlarged prostate gland with median lobe hypertrophy impressing on the base of the bladder. Brachytherapy seeds are noted." -Asymptomatic   Gastroesophageal reflux disease - Continue Protonix   Depression - Continue Cymbalta and Seroquel  Discharge Instructions   Allergies as of 06/13/2022       Reactions   Desmopressin Acetate Other (See Comments)   Unknown - Patient denies   Zoloft [sertraline] Other (See Comments)   Felt bad - Patient  denies        Medication List     TAKE these medications    apixaban 5 MG Tabs tablet Commonly known as:  ELIQUIS Take 5 mg by mouth 2 (two) times daily.   azithromycin 250 MG tablet Commonly known as: ZITHROMAX Take 1 tablet by mouth daily until gone   Denta 5000 Plus 1.1 % Crea dental cream Generic drug: sodium fluoride Place 1 Application onto teeth 2 (two) times daily.   DULoxetine 60 MG capsule Commonly known as: CYMBALTA Take 60 mg by mouth daily.   Farxiga 10 MG Tabs tablet Generic drug: dapagliflozin propanediol Take 10 mg by mouth daily.   ferrous sulfate 325 (65 FE) MG tablet Take 1 tablet (325 mg total) by mouth daily with breakfast. Start taking on: June 14, 2022 What changed: when to take this   gabapentin 300 MG capsule Commonly known as: NEURONTIN Take 2 capsules (600 mg total) by mouth 3 (three) times daily. 600 mg 4 times a day, and 1200 mg at bedtime. What changed:  how much to take additional instructions   ibuprofen 200 MG tablet Commonly known as: ADVIL Take 400 mg by mouth as needed for moderate pain.   metoprolol tartrate 25 MG tablet Commonly known as: LOPRESSOR Take 25 mg by mouth 2 (two) times daily.   multivitamin with minerals Tabs tablet Take 1 tablet by mouth daily.   Oxycodone HCl 20 MG Tabs Take 20 mg by mouth in the morning, at noon, and at bedtime.   pantoprazole 40 MG tablet Commonly known as: PROTONIX Take 1 tablet (40 mg total) by mouth daily.   QUEtiapine 300 MG tablet Commonly known as: SEROQUEL Take 600 mg by mouth at bedtime.   silodosin 8 MG Caps capsule Commonly known as: RAPAFLO Take 8 mg by mouth daily.   Vitamin D (Ergocalciferol) 1.25 MG (50000 UNIT) Caps capsule Commonly known as: DRISDOL Take 50,000 Units by mouth once a week. Thursdays        Follow-up Information     [facility] Follow up.   Why: Home Physical Therapy Contact information: [address] [place]  [address] [phone]         [facility] Home Follow up.   Why: Home Physical Therapy Contact information: [address] [place] [address] [phone]                Allergies  Allergen Reactions   Desmopressin Acetate Other (See Comments)    Unknown - Patient denies   Zoloft [Sertraline] Other (See Comments)    Felt bad - Patient denies    Consultations: None  Procedures/Studies: CT Angio Chest PE W and/or Wo Contrast  Result Date: 06/11/2022 CLINICAL DATA:  Chest and abdominal pain. EXAM: CT ANGIOGRAPHY CHEST CT ABDOMEN AND PELVIS WITH CONTRAST TECHNIQUE: Multidetector CT imaging of the chest was performed using the standard protocol during bolus administration of intravenous contrast. Multiplanar CT image reconstructions and MIPs were obtained to evaluate the vascular anatomy. Multidetector CT imaging of the abdomen and pelvis was performed using the standard protocol during bolus administration of intravenous contrast. RADIATION DOSE REDUCTION: This exam was performed according to the departmental dose-optimization program which includes automated exposure control, adjustment of the mA and/or kV according to patient size and/or use of iterative reconstruction technique. CONTRAST:  75mL OMNIPAQUE IOHEXOL 350 MG/ML SOLN COMPARISON:  None Available. FINDINGS: CTA CHEST FINDINGS Cardiovascular: The heart is normal in size. No pericardial effusion. The aorta is normal in caliber. No dissection. Scattered atherosclerotic calcification. The branch vessels are patent. Scattered coronary artery calcifications. The pulmonary arterial tree is well opacified. No filling defects to suggest  pulmonary embolism. Mediastinum/Nodes: Numerous borderline mediastinal and hilar lymph nodes. These are likely reactive given the significant underlying lung disease. The esophagus is grossly normal. The thyroid gland is unremarkable. Lungs/Pleura: Emphysematous changes and pulmonary  scarring. Patchy E areas of ground-glass opacity most notably in the left upper lobe likely representing an inflammatory or atypical infectious process. Vague nodularity at the right lung base likely atelectasis. No worrisome pulmonary lesions or nodules. Soft tissue density the and both mainstem bronchi, left larger than right. This is most likely inflammatory debris/mucous. No endobronchial mass is identified. Musculoskeletal: No significant bony findings. Review of the MIP images confirms the above findings. CT ABDOMEN and PELVIS FINDINGS Hepatobiliary: No hepatic lesions or intrahepatic biliary dilatation. Small gallstones are noted the gallbladder. No findings for acute cholecystitis. No common bile duct dilatation. Pancreas: Significant diffuse fatty change involving the pancreas with very little normal residual parenchyma. No inflammation or mass. Spleen: Elongated spleen but no overt splenomegaly. No splenic lesions. Adrenals/Urinary Tract: Adrenal glands and kidneys are unremarkable. No renal lesions, renal calculi or hydronephrosis. The bladder is unremarkable. Stomach/Bowel: The stomach, duodenum, small and colon are unremarkable no acute inflammatory process, mass lesions or obstructive findings. The terminal ileum and appendix are normal. Scattered colonic diverticulosis but no acute diverticulitis. Vascular/Lymphatic: Moderate atherosclerotic calcification involving the aorta and iliac arteries. 3.2 x 3.2 cm infrarenal abdominal aortic aneurysm. Recommend follow-up ultrasound every 3 years. This recommendation follows ACR consensus guidelines: White Paper of the ACR Incidental Findings Committee II on Vascular Findings. J Am Coll Radiol 2013; 10:789-794. No mesenteric or retroperitoneal mass or adenopathy. Reproductive: Enlarged prostate gland with median lobe hypertrophy impressing on the base of the bladder. Brachytherapy seeds are noted. The seminal vesicles are unremarkable. Other: No pelvic mass  or adenopathy. No free pelvic fluid collections. No inguinal mass or adenopathy. Small periumbilical abdominal wall hernia containing fat. Musculoskeletal: No significant bony findings. Age related degenerative lumbar spondylosis with multilevel disc disease and facet disease. Review of the MIP images confirms the above findings. IMPRESSION: 1. No CT findings for pulmonary embolism. 2. Normal caliber thoracic aorta. 3. Emphysematous changes and pulmonary scarring. 4. Borderline enlarged mediastinal and hilar lymph nodes likely reactive given the lung findings. 5. Patchy areas of ground-glass opacity most notably in the left upper lobe likely representing an inflammatory or atypical infectious process. Follow-up chest CT in 3-4 months may be helpful to reassess the lungs and the mediastinal/hilar lymph nodes. 6. Soft tissue density the and both mainstem bronchi, left larger than right. This is most likely inflammatory debris/mucous. 7. No acute abdominal/pelvic findings, mass lesions or adenopathy. 8. Cholelithiasis. 9. 3.2 x 3.2 cm infrarenal abdominal aortic aneurysm. Recommend follow-up ultrasound every 3 years. 10. Enlarged prostate gland with median lobe hypertrophy impressing on the base of the bladder. Brachytherapy seeds are noted. 11. Aortic atherosclerosis. Aortic Atherosclerosis (ICD10-I70.0) and Emphysema (ICD10-J43.9). CT Electronically Signed   By: Rudie Meyer M.D.   On: 06/11/2022 16:01   CT ABDOMEN PELVIS W CONTRAST  Result Date: 06/11/2022 CLINICAL DATA:  Chest and abdominal pain. EXAM: CT ANGIOGRAPHY CHEST CT ABDOMEN AND PELVIS WITH CONTRAST TECHNIQUE: Multidetector CT imaging of the chest was performed using the standard protocol during bolus administration of intravenous contrast. Multiplanar CT image reconstructions and MIPs were obtained to evaluate the vascular anatomy. Multidetector CT imaging of the abdomen and pelvis was performed using the standard protocol during bolus administration  of intravenous contrast. RADIATION DOSE REDUCTION: This exam was performed according to the departmental dose-optimization  program which includes automated exposure control, adjustment of the mA and/or kV according to patient size and/or use of iterative reconstruction technique. CONTRAST:  75mL OMNIPAQUE IOHEXOL 350 MG/ML SOLN COMPARISON:  None Available. FINDINGS: CTA CHEST FINDINGS Cardiovascular: The heart is normal in size. No pericardial effusion. The aorta is normal in caliber. No dissection. Scattered atherosclerotic calcification. The branch vessels are patent. Scattered coronary artery calcifications. The pulmonary arterial tree is well opacified. No filling defects to suggest pulmonary embolism. Mediastinum/Nodes: Numerous borderline mediastinal and hilar lymph nodes. These are likely reactive given the significant underlying lung disease. The esophagus is grossly normal. The thyroid gland is unremarkable. Lungs/Pleura: Emphysematous changes and pulmonary scarring. Patchy E areas of ground-glass opacity most notably in the left upper lobe likely representing an inflammatory or atypical infectious process. Vague nodularity at the right lung base likely atelectasis. No worrisome pulmonary lesions or nodules. Soft tissue density the and both mainstem bronchi, left larger than right. This is most likely inflammatory debris/mucous. No endobronchial mass is identified. Musculoskeletal: No significant bony findings. Review of the MIP images confirms the above findings. CT ABDOMEN and PELVIS FINDINGS Hepatobiliary: No hepatic lesions or intrahepatic biliary dilatation. Small gallstones are noted the gallbladder. No findings for acute cholecystitis. No common bile duct dilatation. Pancreas: Significant diffuse fatty change involving the pancreas with very little normal residual parenchyma. No inflammation or mass. Spleen: Elongated spleen but no overt splenomegaly. No splenic lesions. Adrenals/Urinary Tract:  Adrenal glands and kidneys are unremarkable. No renal lesions, renal calculi or hydronephrosis. The bladder is unremarkable. Stomach/Bowel: The stomach, duodenum, small and colon are unremarkable no acute inflammatory process, mass lesions or obstructive findings. The terminal ileum and appendix are normal. Scattered colonic diverticulosis but no acute diverticulitis. Vascular/Lymphatic: Moderate atherosclerotic calcification involving the aorta and iliac arteries. 3.2 x 3.2 cm infrarenal abdominal aortic aneurysm. Recommend follow-up ultrasound every 3 years. This recommendation follows ACR consensus guidelines: White Paper of the ACR Incidental Findings Committee II on Vascular Findings. J Am Coll Radiol 2013; 10:789-794. No mesenteric or retroperitoneal mass or adenopathy. Reproductive: Enlarged prostate gland with median lobe hypertrophy impressing on the base of the bladder. Brachytherapy seeds are noted. The seminal vesicles are unremarkable. Other: No pelvic mass or adenopathy. No free pelvic fluid collections. No inguinal mass or adenopathy. Small periumbilical abdominal wall hernia containing fat. Musculoskeletal: No significant bony findings. Age related degenerative lumbar spondylosis with multilevel disc disease and facet disease. Review of the MIP images confirms the above findings. IMPRESSION: 1. No CT findings for pulmonary embolism. 2. Normal caliber thoracic aorta. 3. Emphysematous changes and pulmonary scarring. 4. Borderline enlarged mediastinal and hilar lymph nodes likely reactive given the lung findings. 5. Patchy areas of ground-glass opacity most notably in the left upper lobe likely representing an inflammatory or atypical infectious process. Follow-up chest CT in 3-4 months may be helpful to reassess the lungs and the mediastinal/hilar lymph nodes. 6. Soft tissue density the and both mainstem bronchi, left larger than right. This is most likely inflammatory debris/mucous. 7. No acute  abdominal/pelvic findings, mass lesions or adenopathy. 8. Cholelithiasis. 9. 3.2 x 3.2 cm infrarenal abdominal aortic aneurysm. Recommend follow-up ultrasound every 3 years. 10. Enlarged prostate gland with median lobe hypertrophy impressing on the base of the bladder. Brachytherapy seeds are noted. 11. Aortic atherosclerosis. Aortic Atherosclerosis (ICD10-I70.0) and Emphysema (ICD10-J43.9). CT Electronically Signed   By: Rudie Meyer M.D.   On: 06/11/2022 16:01   US Abdomen Limited RUQ (LIVER/GB)  Result Date: 06/11/2022 CLINICAL DATA:  Right upper quadrant pain for 2 days EXAM: ULTRASOUND ABDOMEN LIMITED RIGHT UPPER QUADRANT COMPARISON:  None Available. FINDINGS: Gallbladder: No wall thickening. There are a few stones measuring up to 0.6 cm. No sonographic Murphy sign noted by sonographer. Common bile duct: Diameter: 0.6 cm, within normal limits Liver: No focal lesion identified. Within normal limits in parenchymal echogenicity. Portal vein is patent on color Doppler imaging with normal direction of blood flow towards the liver. Other: None. IMPRESSION: Cholelithiasis without sonographic evidence of acute cholecystitis. Electronically Signed   By: Emmaline Kluver M.D.   On: 06/11/2022 14:40   DG Chest 2 View  Result Date: 06/11/2022 CLINICAL DATA:  Hypoxia. EXAM: CHEST - 2 VIEW COMPARISON:  X-ray 10/30/2021 FINDINGS: Hyperinflation. Chronic interstitial lung changes. No consolidation, pneumothorax. Slight blunting of the posteroinferior costophrenic angles. Tiny effusion versus pleural thickening. Normal cardiopericardial silhouette without edema. Fixation hardware seen of the lower cervical spine at the edge of the imaging field. Degenerative changes of the thoracic spine. IMPRESSION: Hyperinflation with chronic lung changes. Tiny effusion versus pleural thickening bilaterally. No consolidation. Electronically Signed   By: Karen Kays M.D.   On: 06/11/2022 13:28   CT Head Wo Contrast  Result Date:  06/11/2022 CLINICAL DATA:  TIA EXAM: CT HEAD WITHOUT CONTRAST TECHNIQUE: Contiguous axial images were obtained from the base of the skull through the vertex without intravenous contrast. RADIATION DOSE REDUCTION: This exam was performed according to the departmental dose-optimization program which includes automated exposure control, adjustment of the mA and/or kV according to patient size and/or use of iterative reconstruction technique. COMPARISON:  10/30/21 CT Head FINDINGS: Brain: No evidence of acute infarction, hemorrhage, hydrocephalus, extra-axial collection or mass lesion/mass effect. Sequela of mild chronic microvascular ischemic change Vascular: No hyperdense vessel or unexpected calcification. Skull: Normal. Negative for fracture or focal lesion. Sinuses/Orbits: No middle ear or mastoid effusion. Paranasal sinuses are clear. Right lens replacement. Orbits otherwise unremarkable. Other: None. IMPRESSION: No acute intracranial abnormality. Electronically Signed   By: Lorenza Cambridge M.D.   On: 06/11/2022 13:13     Subjective: No acute issues or events overnight denies nausea vomiting diarrhea constipation any fevers chills or chest pain   Discharge Exam: Vitals:   06/13/22 0919 06/13/22 1442  BP: 134/70 (!) 137/96  Pulse: 79 79  Resp:  20  Temp: 98.8 F (37.1 C) 97.9 F (36.6 C)  SpO2: 97% 98%   Vitals:   06/13/22 0443 06/13/22 0915 06/13/22 0919 06/13/22 1442  BP: 115/69  134/70 (!) 137/96  Pulse: 67  79 79  Resp: 20   20  Temp: 98 F (36.7 C)  98.8 F (37.1 C) 97.9 F (36.6 C)  TempSrc: Oral  Oral Oral  SpO2: 97% (!) 87% 97% 98%  Weight:      Height:        General: Pt is alert, awake, not in acute distress Cardiovascular: RRR, S1/S2 +, no rubs, no gallops Respiratory: CTA bilaterally, no wheezing, no rhonchi Abdominal: Soft, NT, ND, bowel sounds + Extremities: no edema, no cyanosis    The results of significant diagnostics from this hospitalization (including  imaging, microbiology, ancillary and laboratory) are listed below for reference.     Microbiology: Recent Results (from the past 240 hour(s))  Resp panel by RT-PCR (RSV, Flu A&B, Covid) Anterior Nasal Swab     Status: None   Collection Time: 06/11/22  4:41 PM   Specimen: Anterior Nasal Swab  Result Value Ref Range Status   SARS Coronavirus 2  by RT PCR NEGATIVE NEGATIVE Final    Comment: (NOTE) SARS-CoV-2 target nucleic acids are NOT DETECTED.  The SARS-CoV-2 RNA is generally detectable in upper respiratory specimens during the acute phase of infection. The lowest concentration of SARS-CoV-2 viral copies this assay can detect is 138 copies/mL. A negative result does not preclude SARS-Cov-2 infection and should not be used as the sole basis for treatment or other patient management decisions. A negative result may occur with  improper specimen collection/handling, submission of specimen other than nasopharyngeal swab, presence of viral mutation(s) within the areas targeted by this assay, and inadequate number of viral copies(<138 copies/mL). A negative result must be combined with clinical observations, patient history, and epidemiological information. The expected result is Negative.  Fact Sheet for Patients:  BloggerCourse.com  Fact Sheet for Healthcare Providers:  SeriousBroker.it  This test is no t yet approved or cleared by the [place] FDA and  has been authorized for detection and/or diagnosis of SARS-CoV-2 by FDA under an Emergency Use Authorization (EUA). This EUA will remain  in effect (meaning this test can be used) for the duration of the COVID-19 declaration under Section 564(b)(1) of the Act, 21 U.S.C.section 360bbb-3(b)(1), unless the authorization is terminated  or revoked sooner.       Influenza A by PCR NEGATIVE NEGATIVE Final   Influenza B by PCR NEGATIVE NEGATIVE Final    Comment: (NOTE) The Xpert  Xpress SARS-CoV-2/FLU/RSV plus assay is intended as an aid in the diagnosis of influenza from Nasopharyngeal swab specimens and should not be used as a sole basis for treatment. Nasal washings and aspirates are unacceptable for Xpert Xpress SARS-CoV-2/FLU/RSV testing.  Fact Sheet for Patients: BloggerCourse.com  Fact Sheet for Healthcare Providers: SeriousBroker.it  This test is not yet approved or cleared by the [place] FDA and has been authorized for detection and/or diagnosis of SARS-CoV-2 by FDA under an Emergency Use Authorization (EUA). This EUA will remain in effect (meaning this test can be used) for the duration of the COVID-19 declaration under Section 564(b)(1) of the Act, 21 U.S.C. section 360bbb-3(b)(1), unless the authorization is terminated or revoked.     Resp Syncytial Virus by PCR NEGATIVE NEGATIVE Final    Comment: (NOTE) Fact Sheet for Patients: BloggerCourse.com  Fact Sheet for Healthcare Providers: SeriousBroker.it  This test is not yet approved or cleared by the [place] FDA and has been authorized for detection and/or diagnosis of SARS-CoV-2 by FDA under an Emergency Use Authorization (EUA). This EUA will remain in effect (meaning this test can be used) for the duration of the COVID-19 declaration under Section 564(b)(1) of the Act, 21 U.S.C. section 360bbb-3(b)(1), unless the authorization is terminated or revoked.  Performed at [facility], [address], [place], [place] [address]   Respiratory (~20 pathogens) panel by PCR     Status: None   Collection Time: 06/12/22  1:13 AM   Specimen: Nasopharyngeal Swab; Respiratory  Result Value Ref Range Status   Adenovirus NOT DETECTED NOT DETECTED Final   Coronavirus 229E NOT DETECTED NOT DETECTED Final    Comment: (NOTE) The Coronavirus on the Respiratory Panel, DOES NOT  test for the novel  Coronavirus (2019 nCoV)    Coronavirus HKU1 NOT DETECTED NOT DETECTED Final   Coronavirus NL63 NOT DETECTED NOT DETECTED Final   Coronavirus OC43 NOT DETECTED NOT DETECTED Final   Metapneumovirus NOT DETECTED NOT DETECTED Final   Rhinovirus / Enterovirus NOT DETECTED NOT DETECTED Final   Influenza A NOT DETECTED  NOT DETECTED Final   Influenza B NOT DETECTED NOT DETECTED Final   Parainfluenza Virus 1 NOT DETECTED NOT DETECTED Final   Parainfluenza Virus 2 NOT DETECTED NOT DETECTED Final   Parainfluenza Virus 3 NOT DETECTED NOT DETECTED Final   Parainfluenza Virus 4 NOT DETECTED NOT DETECTED Final   Respiratory Syncytial Virus NOT DETECTED NOT DETECTED Final   Bordetella pertussis NOT DETECTED NOT DETECTED Final   Bordetella Parapertussis NOT DETECTED NOT DETECTED Final   Chlamydophila pneumoniae NOT DETECTED NOT DETECTED Final   Mycoplasma pneumoniae NOT DETECTED NOT DETECTED Final    Comment: Performed at [facility], [address] [place], [place] [address]     Labs: BNP (last 3 results) Recent Labs    10/30/21 1605  BNP 390.4*   Basic Metabolic Panel: Recent Labs  Lab 06/11/22 1204 06/12/22 0510 06/13/22 0505  NA 134* 135 134*  K 4.5 4.1 3.8  CL 97* 100 99  CO2 GLUCOSE 118* 106* 110*  BUN CREATININE 1.76* 1.46* 1.40*  CALCIUM 9.1 8.2* 8.4*  MG  --  2.1  --    Liver Function Tests: Recent Labs  Lab 06/11/22 1204  AST 35  ALT 12  ALKPHOS 74  BILITOT 0.9  PROT 7.0  ALBUMIN 4.2   Recent Labs  Lab 06/11/22 1204  LIPASE <10*   No results for input(s): "AMMONIA" in the last 168 hours. CBC: Recent Labs  Lab 06/11/22 1204 06/12/22 0510 06/13/22 0505  WBC 7.3 4.3 4.8  NEUTROABS  --  2.5  --   HGB 11.2* 9.9* 10.9*  HCT 34.6* 31.4* 33.4*  MCV 94.8 96.0 95.2  PLT 144* 140* 148*   Cardiac Enzymes: No results for input(s): "CKTOTAL", "CKMB", "CKMBINDEX", "TROPONINI" in the last 168 hours. BNP: Invalid  input(s): "POCBNP" CBG: No results for input(s): "GLUCAP" in the last 168 hours. D-Dimer No results for input(s): "DDIMER" in the last 72 hours. Hgb A1c No results for input(s): "HGBA1C" in the last 72 hours. Lipid Profile No results for input(s): "CHOL", "HDL", "LDLCALC", "TRIG", "CHOLHDL", "LDLDIRECT" in the last 72 hours. Thyroid function studies No results for input(s): "TSH", "T4TOTAL", "T3FREE", "THYROIDAB" in the last 72 hours.  Invalid input(s): "FREET3" Anemia work up No results for input(s): "VITAMINB12", "FOLATE", "FERRITIN", "TIBC", "IRON", "RETICCTPCT" in the last 72 hours. Urinalysis    Component Value Date/Time   COLORURINE YELLOW 06/11/2022 1204   APPEARANCEUR CLEAR 06/11/2022 1204   LABSPEC 1.009 06/11/2022 1204   PHURINE 6.5 06/11/2022 1204   GLUCOSEU NEGATIVE 06/11/2022 1204   HGBUR TRACE (A) 06/11/2022 1204   BILIRUBINUR NEGATIVE 06/11/2022 1204   KETONESUR NEGATIVE 06/11/2022 1204   PROTEINUR NEGATIVE 06/11/2022 1204   NITRITE NEGATIVE 06/11/2022 1204   LEUKOCYTESUR NEGATIVE 06/11/2022 1204   Sepsis Labs Recent Labs  Lab 06/11/22 1204 06/12/22 0510 06/13/22 0505  WBC 7.3 4.3 4.8   Microbiology Recent Results (from the past 240 hour(s))  Resp panel by RT-PCR (RSV, Flu A&B, Covid) Anterior Nasal Swab     Status: None   Collection Time: 06/11/22  4:41 PM   Specimen: Anterior Nasal Swab  Result Value Ref Range Status   SARS Coronavirus 2 by RT PCR NEGATIVE NEGATIVE Final    Comment: (NOTE) SARS-CoV-2 target nucleic acids are NOT DETECTED.  The SARS-CoV-2 RNA is generally detectable in upper respiratory specimens during the acute phase of infection. The lowest concentration of SARS-CoV-2 viral copies this assay can detect is 138  copies/mL. A negative result does not preclude SARS-Cov-2 infection and should not be used as the sole basis for treatment or other patient management decisions. A negative result may occur with  improper specimen  collection/handling, submission of specimen other than nasopharyngeal swab, presence of viral mutation(s) within the areas targeted by this assay, and inadequate number of viral copies(<138 copies/mL). A negative result must be combined with clinical observations, patient history, and epidemiological information. The expected result is Negative.  Fact Sheet for Patients:  BloggerCourse.com  Fact Sheet for Healthcare Providers:  SeriousBroker.it  This test is no t yet approved or cleared by the [place] FDA and  has been authorized for detection and/or diagnosis of SARS-CoV-2 by FDA under an Emergency Use Authorization (EUA). This EUA will remain  in effect (meaning this test can be used) for the duration of the COVID-19 declaration under Section 564(b)(1) of the Act, 21 U.S.C.section 360bbb-3(b)(1), unless the authorization is terminated  or revoked sooner.       Influenza A by PCR NEGATIVE NEGATIVE Final   Influenza B by PCR NEGATIVE NEGATIVE Final    Comment: (NOTE) The Xpert Xpress SARS-CoV-2/FLU/RSV plus assay is intended as an aid in the diagnosis of influenza from Nasopharyngeal swab specimens and should not be used as a sole basis for treatment. Nasal washings and aspirates are unacceptable for Xpert Xpress SARS-CoV-2/FLU/RSV testing.  Fact Sheet for Patients: BloggerCourse.com  Fact Sheet for Healthcare Providers: SeriousBroker.it  This test is not yet approved or cleared by the [place] FDA and has been authorized for detection and/or diagnosis of SARS-CoV-2 by FDA under an Emergency Use Authorization (EUA). This EUA will remain in effect (meaning this test can be used) for the duration of the COVID-19 declaration under Section 564(b)(1) of the Act, 21 U.S.C. section 360bbb-3(b)(1), unless the authorization is terminated or revoked.     Resp Syncytial  Virus by PCR NEGATIVE NEGATIVE Final    Comment: (NOTE) Fact Sheet for Patients: BloggerCourse.com  Fact Sheet for Healthcare Providers: SeriousBroker.it  This test is not yet approved or cleared by the [place] FDA and has been authorized for detection and/or diagnosis of SARS-CoV-2 by FDA under an Emergency Use Authorization (EUA). This EUA will remain in effect (meaning this test can be used) for the duration of the COVID-19 declaration under Section 564(b)(1) of the Act, 21 U.S.C. section 360bbb-3(b)(1), unless the authorization is terminated or revoked.  Performed at [facility], [address], [place], [place] [address]   Respiratory (~20 pathogens) panel by PCR     Status: None   Collection Time: 06/12/22  1:13 AM   Specimen: Nasopharyngeal Swab; Respiratory  Result Value Ref Range Status   Adenovirus NOT DETECTED NOT DETECTED Final   Coronavirus 229E NOT DETECTED NOT DETECTED Final    Comment: (NOTE) The Coronavirus on the Respiratory Panel, DOES NOT test for the novel  Coronavirus (2019 nCoV)    Coronavirus HKU1 NOT DETECTED NOT DETECTED Final   Coronavirus NL63 NOT DETECTED NOT DETECTED Final   Coronavirus OC43 NOT DETECTED NOT DETECTED Final   Metapneumovirus NOT DETECTED NOT DETECTED Final   Rhinovirus / Enterovirus NOT DETECTED NOT DETECTED Final   Influenza A NOT DETECTED NOT DETECTED Final   Influenza B NOT DETECTED NOT DETECTED Final   Parainfluenza Virus 1 NOT DETECTED NOT DETECTED Final   Parainfluenza Virus 2 NOT DETECTED NOT DETECTED Final   Parainfluenza Virus 3 NOT DETECTED NOT DETECTED Final   Parainfluenza Virus 4 NOT  DETECTED NOT DETECTED Final   Respiratory Syncytial Virus NOT DETECTED NOT DETECTED Final   Bordetella pertussis NOT DETECTED NOT DETECTED Final   Bordetella Parapertussis NOT DETECTED NOT DETECTED Final   Chlamydophila pneumoniae NOT DETECTED NOT DETECTED Final    Mycoplasma pneumoniae NOT DETECTED NOT DETECTED Final    Comment: Performed at [facility], [address], [place], [place] [address]     Time coordinating discharge: Over 30 minutes  SIGNED:   Azucena Fallen, DO [facility] 06/13/2022, 2:58 PM Pager   If 7PM-7AM, please contact night-coverage [email or web address]

## 2022-06-13 NOTE — Progress Notes (Signed)
Nutrition Brief Note  RD consulted for nutritional assessment via COPD protocol.  Pt currently consuming 100% of meals, typically good appetite, consumes 2 meals daily. Consistently weighs himself every 1st and 15th of every month. Has noticed no significant weight changes, can fluctuate between 10 lbs. Denies any issues with swallowing or chewing.  Pt reports he may discharge today.  Wt Readings from Last 15 Encounters:  06/12/22 102.5 kg  12/10/21 97.6 kg  11/02/21 100.1 kg  10/11/20 99.8 kg  09/20/20 93 kg  08/25/20 100.2 kg  03/08/20 94.5 kg  10/10/19 90.8 kg  09/30/19 86.2 kg  01/20/18 97.3 kg  11/26/17 97.2 kg  06/02/17 103 kg  09/18/16 99.8 kg  07/22/16 100.7 kg  05/28/16 94.8 kg    Body mass index is 29 kg/m. Patient meets criteria for overweight based on current BMI.   Current diet order is regular, patient is consuming approximately 100% of meals at this time. Labs and medications reviewed.   No nutrition interventions warranted at this time. If nutrition issues arise, please consult RD.   Tilda Franco, MS, RD, LDN Inpatient Clinical Dietitian Contact information available via Amion

## 2022-06-13 NOTE — Progress Notes (Signed)
D/C instructions reviewed w/ pt. Pt verbalizes understanding and all questions answered. Pt in possession of d/c packet and all personal belongings. Awaiting son for ride home, states son is on his way.

## 2022-06-13 NOTE — Progress Notes (Signed)
Pt ambulated in hall w/ OT. O2 sat dropped to 86% on RA with ambulation. HR 120's. O2 sat increased to 96% on RA after sitting in chair upon return to room. Denies dyspnea or SOB.

## 2022-06-13 NOTE — Evaluation (Signed)
Occupational Therapy Evaluation Patient Details Name: Dustin [name] MRN: [id number] DOB: [birth year] Today's Date: 06/13/2022   History of Present Illness 82 yo male admitted with acute resp failure with hypoxa. Hx of BPH, memory loss, neuropathy, depression, Afib, cardioversion, ataxia, falls, HF, emphysema, IM nail 2022, insomnia, UTI, metabolic encephalopathy   Clinical Impression   Patient admitted for the diagnosis above.  PTA he lives at home alone, with check-ins from children.  Patient uses a quad cane at baseline, and remained fairly independent with ADL and iADL.  Currently he is up to Supervision for ADL completion and mobility.   OT is indicated in the acute setting to address deficits, and no post acute OT is anticipated.       Recommendations for follow up therapy are one component of a multi-disciplinary discharge planning process, led by the attending physician.  Recommendations may be updated based on patient status, additional functional criteria and insurance authorization.   Assistance Recommended at Discharge Set up Supervision/Assistance  Patient can return home with the following Assist for transportation;Assistance with cooking/housework    Functional Status Assessment  Patient has had a recent decline in their functional status and demonstrates the ability to make significant improvements in function in a reasonable and predictable amount of time.  Equipment Recommendations  None recommended by OT    Recommendations for Other Services       Precautions / Restrictions Precautions Precautions: Fall Precaution Comments: monitor O2 Restrictions Weight Bearing Restrictions: No      Mobility Bed Mobility Overal bed mobility: Modified Independent                  Transfers Overall transfer level: Modified independent                        Balance Overall balance assessment: History of Falls Sitting-balance support: Feet  supported Sitting balance-Leahy Scale: Good     Standing balance support: Single extremity supported Standing balance-Leahy Scale: Fair Standing balance comment: Initial unsteadiness with stand, doesn't use quad care appropriately.                           ADL either performed or assessed with clinical judgement   ADL       Grooming: Wash/dry hands;Wash/dry face;Supervision/safety;Standing               Lower Body Dressing: Supervision/safety;Sit to/from stand   Toilet Transfer: Regular Toilet;Ambulation;Modified Independent                   Vision Patient Visual Report: No change from baseline       Perception     Praxis      Pertinent Vitals/Pain Pain Assessment Pain Assessment: No/denies pain     Hand Dominance Left   Extremity/Trunk Assessment Upper Extremity Assessment Upper Extremity Assessment: Overall WFL for tasks assessed   Lower Extremity Assessment Lower Extremity Assessment: Defer to PT evaluation   Cervical / Trunk Assessment Cervical / Trunk Assessment: Kyphotic   Communication Communication Communication: HOH   Cognition Arousal/Alertness: Awake/alert Behavior During Therapy: WFL for tasks assessed/performed Overall Cognitive Status: Within Functional Limits for tasks assessed                                       General Comments   91% to 94% on RA  Exercises     Shoulder Instructions      Home Living Family/patient expects to be discharged to:: Private residence Living Arrangements: Alone Available Help at Discharge: Family;Available PRN/intermittently Type of Home: House Home Access: Stairs to enter [employer] of Steps: 3 Entrance Stairs-Rails: Right;Left;Can reach both Home Layout: Two level;Able to live on main level with bedroom/bathroom   Alternate Level Stairs-Rails: Left Bathroom Shower/Tub: Walk-in shower;Door   Bathroom Toilet: Handicapped height     Home  Equipment: Grab bars - toilet;Grab bars - tub/shower;Hand held shower head;Shower seat;BSC/3in1;Rolling Environmental consultant (2 wheels);Cane - single point   Additional Comments: son and dtr get togethers every couple weeks. They call 2-3 times a week. Housekeeper every 3 weeks.      Prior Functioning/Environment Prior Level of Function : Independent/Modified Independent             Mobility Comments: Uses cane at times when out, "furniture cruises" as needed inside home ADLs Comments: Mod I for bathing and dressing        OT Problem List: Decreased activity tolerance;Decreased strength;Impaired balance (sitting and/or standing)      OT Treatment/Interventions: Self-care/ADL training;Therapeutic activities;Therapeutic exercise;Balance training;Patient/family education;DME and/or AE instruction;Energy conservation    OT Goals(Current goals can be found in the care plan section) Acute Rehab OT Goals Patient Stated Goal: Hoping to go home soon OT Goal Formulation: With patient Time For Goal Achievement: 06/27/22 Potential to Achieve Goals: Good ADL Goals Pt Will Perform Grooming: with modified independence;standing Pt Will Perform Lower Body Dressing: with modified independence;sit to/from stand Pt Will Transfer to Toilet: with modified independence;ambulating;regular height toilet Pt/caregiver will Perform Home Exercise Program: Increased strength;Both right and left upper extremity;With theraband;With Supervision  OT Frequency: Min 1X/week    Co-evaluation              AM-PAC OT "6 Clicks" Daily Activity     Outcome Measure Help from another person eating meals?: None Help from another person taking care of personal grooming?: A Little Help from another person toileting, which includes using toliet, bedpan, or urinal?: A Little Help from another person bathing (including washing, rinsing, drying)?: A Little Help from another person to put on and taking off regular upper body  clothing?: None Help from another person to put on and taking off regular lower body clothing?: A Little 6 Click Score: 20   End of Session Equipment Utilized During Treatment: Other (comment) Nurse Communication: Mobility status  Activity Tolerance: Patient tolerated treatment well Patient left: in chair;with call bell/phone within reach  OT Visit Diagnosis: Unsteadiness on feet (R26.81)                Time: 8657-8469 OT Time Calculation (min): 18 min Charges:  OT General Charges $OT Visit: 1 Visit OT Evaluation $OT Eval Moderate Complexity: 1 Mod  06/13/2022  RP, OTR/L  Acute Rehabilitation Services  Office:  [phone]   Suzanna Obey 06/13/2022, 10:07 AM

## 2022-06-13 NOTE — Plan of Care (Signed)
  Problem: Education: Goal: Knowledge of General Education information will improve Description: Including pain rating scale, medication(s)/side effects and non-pharmacologic comfort measures Outcome: Progressing   Problem: Health Behavior/Discharge Planning: Goal: Ability to manage health-related needs will improve Outcome: Progressing   Problem: Clinical Measurements: Goal: Ability to maintain clinical measurements within normal limits will improve Outcome: Progressing Goal: Will remain free from infection Outcome: Progressing Goal: Diagnostic test results will improve Outcome: Progressing Goal: Respiratory complications will improve Outcome: Progressing Goal: Cardiovascular complication will be avoided Outcome: Progressing   Problem: Activity: Goal: Risk for activity intolerance will decrease Outcome: Progressing   Problem: Nutrition: Goal: Adequate nutrition will be maintained Outcome: Progressing   Problem: Pain Managment: Goal: General experience of comfort will improve Outcome: Progressing   Problem: Safety: Goal: Ability to remain free from injury will improve Outcome: Progressing   

## 2022-06-13 NOTE — TOC Initial Note (Addendum)
Transition of Care [facility]) - Initial/Assessment Note    Patient Details  Name: Dustin [name] MRN: [id number] Date of Birth: 17-Mar-1940  [facility]) CM/SW Contact:    Howell Rucks, RN Phone Number: 06/13/2022, 10:55 AM  Clinical Narrative:   Met with pt in room today to introduce TOC/NCM role and review PT recommendation for HHPT, pt agreeable, no preference. Pt reports he lives alone, has support from his children, confirmed has transport home. Will text out for HHPT. Will continue to follow - 12:47p  [facility] HH accepted for [facility] PT. Will continue to follow.                  Expected Discharge Plan: Home w Home Health Services Barriers to Discharge: Continued Medical Work up   Patient Goals and CMS Choice Patient states their goals for this hospitalization and ongoing recovery are:: Home with [facility] PT CMS Medicare.gov Compare Post Acute Care list provided to:: Patient Choice offered to / list presented to : Patient      Expected Discharge Plan and Services   Discharge Planning Services: CM Consult Post Acute Care Choice: Home Health                                        Prior Living Arrangements/Services   Lives with:: Self Patient language and need for interpreter reviewed:: Yes Do you feel safe going back to the place where you live?: Yes      Need for Family Participation in Patient Care: Yes (Comment) Care giver support system in place?: Yes (comment) Current home services: DME (cane, walker, wheelchair) Criminal Activity/Legal Involvement Pertinent to Current Situation/Hospitalization: No - Comment as needed  Activities of Daily Living Home Assistive Devices/Equipment: None ADL Screening (condition at time of admission) Patient's cognitive ability adequate to safely complete daily activities?: Yes Is the patient deaf or have difficulty hearing?: Yes Does the patient have difficulty seeing, even when wearing glasses/contacts?: No Does the  patient have difficulty concentrating, remembering, or making decisions?: No Patient able to express need for assistance with ADLs?: Yes Does the patient have difficulty dressing or bathing?: No Independently performs ADLs?: Yes (appropriate for developmental age) Does the patient have difficulty walking or climbing stairs?: No Weakness of Legs: None Weakness of Arms/Hands: None  Permission Sought/Granted Permission sought to share information with : Case Manager Permission granted to share information with : Yes, Verbal Permission Granted  Share Information with NAME: Fannie Knee, RN           Emotional Assessment Appearance:: Appears stated age Attitude/Demeanor/Rapport: Gracious Affect (typically observed): Accepting Orientation: : Oriented to Self, Oriented to Place, Oriented to  Time, Oriented to Situation Alcohol / Substance Use: Not Applicable Psych Involvement: No (comment)  Admission diagnosis:  Hypoxia [R09.02] Acute respiratory failure with hypoxia [J96.01] Community acquired pneumonia of left upper lobe of lung [J18.9] Patient Active Problem List   Diagnosis Date Noted   Emphysema of lung 06/12/2022   AAA (abdominal aortic aneurysm) 06/12/2022   Abdominal pain 06/12/2022   Urinary tract infection without hematuria    Pneumonia due to infectious organism    Acute respiratory failure with hypoxia 10/30/2021   Persistent atrial fibrillation 08/25/2020   Left ventricular dysfunction 08/25/2020   Acute posthemorrhagic anemia 03/17/2020   Femur fracture 03/07/2020   Hip fracture 03/07/2020   Cellulitis of right lower extremity  Acute metabolic encephalopathy    Unspecified fracture of right foot, initial encounter for closed fracture    Acute urinary retention    Idiopathic peripheral neuropathy    Sepsis due to cellulitis 10/07/2019   New onset headache 01/20/2018   Muscle weakness (generalized) 11/28/2017   Weakness 11/26/2017   Right lumbar radiculopathy  09/18/2016   Right leg pain 07/22/2016   Restless leg syndrome 05/28/2016   Iron deficiency anemia 05/28/2016   Fall 02/10/2016   Rib fracture 02/10/2016   Acute encephalopathy 02/10/2016   Chronic combined systolic (congestive) and diastolic (congestive) heart failure (HCC)    Ataxia    Hyponatremia 01/22/2016   Gait instability 01/22/2016   Urinary retention    Stroke-like symptom    Syncope 05/02/2015   Closed left fibular fracture 05/02/2015   GERD (gastroesophageal reflux disease) 05/02/2015   BPH (benign prostatic hyperplasia) 05/02/2015   Fracture of distal end of left fibula    Memory loss 01/30/2015   Depression 01/30/2015   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 [place] [address] Phone: [phone] Fax: [phone]  [facility] [address] [place] [address] Phone: [phone] Fax: [phone]     Social Determinants of Health (SDOH) Social History: SDOH Screenings   Food Insecurity: No Food Insecurity (06/12/2022)  Housing: Low Risk  (06/12/2022)  Transportation Needs: No Transportation Needs (06/12/2022)  Utilities: Not At Risk (06/12/2022)  Tobacco Use: Medium Risk (06/11/2022)   SDOH Interventions:     Readmission Risk Interventions    06/13/2022   10:53 AM  Readmission Risk Prevention Plan  Transportation Screening Complete  PCP or Specialist Appt within 5-7 Days Complete  Home Care Screening Complete  Medication Review (RN CM) Complete

## 2022-06-13 NOTE — Plan of Care (Signed)
  Problem: Clinical Measurements: Goal: Respiratory complications will improve 06/13/2022 1547 by Gwendlyn Deutscher, RN Outcome: Adequate for Discharge 06/13/2022 1008 by Gwendlyn Deutscher, RN Outcome: Progressing   Problem: Activity: Goal: Risk for activity intolerance will decrease 06/13/2022 1547 by Gwendlyn Deutscher, RN Outcome: Adequate for Discharge 06/13/2022 1008 by Gwendlyn Deutscher, RN Outcome: Progressing

## 2022-06-13 NOTE — Progress Notes (Signed)
Dustin [name] has ambulated in the hall on RA twice since this morning. He has maintained his O2 sat >90% on both of these occasions. He denies SOB/dyspnea.

## 2022-11-12 ENCOUNTER — Inpatient Hospital Stay (HOSPITAL_COMMUNITY)
Admission: EM | Admit: 2022-11-12 | Discharge: 2022-11-15 | DRG: 177 | Disposition: A | Discharge status: Home / Self Care | Payer: Medicare Other | Attending: Family Medicine | Admitting: Family Medicine

## 2022-11-12 ENCOUNTER — Other Ambulatory Visit: Payer: Self-pay

## 2022-11-12 ENCOUNTER — Emergency Department (HOSPITAL_COMMUNITY): Payer: Medicare Other

## 2022-11-12 DIAGNOSIS — J1282 Pneumonia due to coronavirus disease 2019: Secondary | ICD-10-CM | POA: Diagnosis present

## 2022-11-12 DIAGNOSIS — I3139 Other pericardial effusion (noninflammatory): Secondary | ICD-10-CM | POA: Diagnosis present

## 2022-11-12 DIAGNOSIS — G931 Anoxic brain damage, not elsewhere classified: Secondary | ICD-10-CM | POA: Diagnosis present

## 2022-11-12 DIAGNOSIS — I5042 Chronic combined systolic (congestive) and diastolic (congestive) heart failure: Secondary | ICD-10-CM | POA: Diagnosis present

## 2022-11-12 DIAGNOSIS — I4819 Other persistent atrial fibrillation: Secondary | ICD-10-CM | POA: Diagnosis present

## 2022-11-12 DIAGNOSIS — N400 Enlarged prostate without lower urinary tract symptoms: Secondary | ICD-10-CM | POA: Diagnosis present

## 2022-11-12 DIAGNOSIS — T426X5A Adverse effect of other antiepileptic and sedative-hypnotic drugs, initial encounter: Secondary | ICD-10-CM | POA: Diagnosis present

## 2022-11-12 DIAGNOSIS — J9600 Acute respiratory failure, unspecified whether with hypoxia or hypercapnia: Secondary | ICD-10-CM

## 2022-11-12 DIAGNOSIS — J9601 Acute respiratory failure with hypoxia: Secondary | ICD-10-CM | POA: Diagnosis not present

## 2022-11-12 DIAGNOSIS — R911 Solitary pulmonary nodule: Secondary | ICD-10-CM | POA: Diagnosis present

## 2022-11-12 DIAGNOSIS — Z87891 Personal history of nicotine dependence: Secondary | ICD-10-CM

## 2022-11-12 DIAGNOSIS — Y92009 Unspecified place in unspecified non-institutional (private) residence as the place of occurrence of the external cause: Secondary | ICD-10-CM

## 2022-11-12 DIAGNOSIS — K219 Gastro-esophageal reflux disease without esophagitis: Secondary | ICD-10-CM | POA: Diagnosis present

## 2022-11-12 DIAGNOSIS — T43595A Adverse effect of other antipsychotics and neuroleptics, initial encounter: Secondary | ICD-10-CM | POA: Diagnosis present

## 2022-11-12 DIAGNOSIS — Z823 Family history of stroke: Secondary | ICD-10-CM

## 2022-11-12 DIAGNOSIS — Z825 Family history of asthma and other chronic lower respiratory diseases: Secondary | ICD-10-CM

## 2022-11-12 DIAGNOSIS — G9341 Metabolic encephalopathy: Secondary | ICD-10-CM | POA: Diagnosis present

## 2022-11-12 DIAGNOSIS — R7982 Elevated C-reactive protein (CRP): Secondary | ICD-10-CM | POA: Diagnosis present

## 2022-11-12 DIAGNOSIS — I2721 Secondary pulmonary arterial hypertension: Secondary | ICD-10-CM | POA: Diagnosis present

## 2022-11-12 DIAGNOSIS — R2689 Other abnormalities of gait and mobility: Secondary | ICD-10-CM | POA: Diagnosis present

## 2022-11-12 DIAGNOSIS — Z79899 Other long term (current) drug therapy: Secondary | ICD-10-CM

## 2022-11-12 DIAGNOSIS — U071 COVID-19: Principal | ICD-10-CM | POA: Diagnosis present

## 2022-11-12 DIAGNOSIS — Z888 Allergy status to other drugs, medicaments and biological substances status: Secondary | ICD-10-CM

## 2022-11-12 DIAGNOSIS — F32A Depression, unspecified: Secondary | ICD-10-CM | POA: Diagnosis present

## 2022-11-12 DIAGNOSIS — J440 Chronic obstructive pulmonary disease with acute lower respiratory infection: Secondary | ICD-10-CM | POA: Diagnosis present

## 2022-11-12 DIAGNOSIS — G928 Other toxic encephalopathy: Secondary | ICD-10-CM | POA: Diagnosis present

## 2022-11-12 DIAGNOSIS — R2681 Unsteadiness on feet: Secondary | ICD-10-CM | POA: Diagnosis present

## 2022-11-12 DIAGNOSIS — J449 Chronic obstructive pulmonary disease, unspecified: Secondary | ICD-10-CM

## 2022-11-12 DIAGNOSIS — N184 Chronic kidney disease, stage 4 (severe): Secondary | ICD-10-CM | POA: Diagnosis present

## 2022-11-12 DIAGNOSIS — Z981 Arthrodesis status: Secondary | ICD-10-CM

## 2022-11-12 DIAGNOSIS — Z7901 Long term (current) use of anticoagulants: Secondary | ICD-10-CM

## 2022-11-12 DIAGNOSIS — Z66 Do not resuscitate: Secondary | ICD-10-CM | POA: Diagnosis present

## 2022-11-12 DIAGNOSIS — J690 Pneumonitis due to inhalation of food and vomit: Secondary | ICD-10-CM | POA: Diagnosis present

## 2022-11-12 LAB — I-STAT ARTERIAL BLOOD GAS, ED
Acid-Base Excess: 1 mmol/L (ref 0.0–2.0)
Bicarbonate: 26.5 mmol/L (ref 20.0–28.0)
Calcium, Ion: 1.17 mmol/L (ref 1.15–1.40)
HCT: 33 % — ABNORMAL LOW (ref 39.0–52.0)
Hemoglobin: 11.2 g/dL — ABNORMAL LOW (ref 13.0–17.0)
O2 Saturation: 88 %
Patient temperature: 98.6
Potassium: 3.5 mmol/L (ref 3.5–5.1)
Sodium: 134 mmol/L — ABNORMAL LOW (ref 135–145)
TCO2: 28 mmol/L (ref 22–32)
pCO2 arterial: 44.9 mmHg (ref 32–48)
pH, Arterial: 7.38 (ref 7.35–7.45)
pO2, Arterial: 57 mmHg — ABNORMAL LOW (ref 83–108)

## 2022-11-12 MED ORDER — SODIUM CHLORIDE 0.9 % IV SOLN
500.0000 mg | INTRAVENOUS | Status: DC
  Administered 2022-11-13: 500 mg via INTRAVENOUS
  Filled 2022-11-12: qty 5

## 2022-11-12 MED ORDER — LACTATED RINGERS IV BOLUS (SEPSIS)
1000.0000 mL | Freq: Once | INTRAVENOUS | Status: AC
  Administered 2022-11-12: 1000 mL via INTRAVENOUS

## 2022-11-12 MED ORDER — LACTATED RINGERS IV SOLN: INTRAVENOUS | Status: AC

## 2022-11-12 MED ORDER — SODIUM CHLORIDE 0.9 % IV SOLN
2.0000 g | INTRAVENOUS | Status: DC
  Administered 2022-11-13 – 2022-11-14 (×3): 2 g via INTRAVENOUS
  Filled 2022-11-12 (×3): qty 20

## 2022-11-12 NOTE — ED Notes (Signed)
Assessed lungs rhonci sounds heard.

## 2022-11-12 NOTE — ED Provider Notes (Signed)
[place] EMERGENCY DEPARTMENT AT [facility] Provider Note   CSN: [id number] Arrival date & time: 11/12/22  2247     History {Add pertinent medical, surgical, social history, OB history to HPI:1} No chief complaint on file.   OLEH KEATH is a 82 y.o. male.  Patient with a history of neuropathy, heart failure, GERD, atrial fibrillation on Eliquis presenting with a 1 day history of shortness of breath, hypoxia and decreased mental status.  Family reports he has been ill for the past 2 days.  Has noticed increased confusion, chills, fever to 101.5.  Some increased shortness of breath and coughing but not able to cough up anything.  On EMS arrival was hypoxic to the 70s.  Comes in on a nonrebreather saturating 99%.  Denies any history of asthma or COPD.  Has a deep cough and feels short of breath and is confused per family.  No abdominal pain.  No vomiting or diarrhea.  No leg pain or leg swelling.  The history is provided by the patient, a relative and the EMS personnel.       Home Medications Prior to Admission medications   Medication Sig Start Date End Date Taking? Authorizing Provider  apixaban (ELIQUIS) 5 MG TABS tablet Take 5 mg by mouth 2 (two) times daily. 06/23/20   [provider]  azithromycin (ZITHROMAX) 250 MG tablet Take 1 tablet by mouth daily until gone 06/13/22   Azucena Fallen, MD  DENTA 5000 PLUS 1.1 % CREA dental cream Place 1 Application onto teeth 2 (two) times daily. 06/18/21   [provider]  DULoxetine (CYMBALTA) 60 MG capsule Take 60 mg by mouth daily. 05/08/22   [provider]  FARXIGA 10 MG TABS tablet Take 10 mg by mouth daily. 10/10/21   [provider]  ferrous sulfate 325 (65 FE) MG tablet Take 1 tablet (325 mg total) by mouth daily with breakfast. 06/14/22   Azucena Fallen, MD  gabapentin (NEURONTIN) 300 MG capsule Take 2 capsules (600 mg total) by mouth 3 (three) times daily. 600 mg 4 times a day,  and 1200 mg at bedtime. Patient taking differently: Take 600-1,200 mg by mouth 3 (three) times daily. Take 600 mg by mouth in the morning and in the afternoon. Take 1200 mg by mouth at bedtime. 11/02/21   Rodolph Bong, MD  ibuprofen (ADVIL) 200 MG tablet Take 400 mg by mouth as needed for moderate pain.    [provider]  metoprolol tartrate (LOPRESSOR) 25 MG tablet Take 25 mg by mouth 2 (two) times daily. 06/23/20   [provider]  Multiple Vitamin (MULTIVITAMIN WITH MINERALS) TABS tablet Take 1 tablet by mouth daily. 03/18/20   Myrtie Neither, MD  Oxycodone HCl 20 MG TABS Take 20 mg by mouth in the morning, at noon, and at bedtime.    [provider]  pantoprazole (PROTONIX) 40 MG tablet Take 1 tablet (40 mg total) by mouth daily. 03/18/20   Myrtie Neither, MD  QUEtiapine (SEROQUEL) 300 MG tablet Take 600 mg by mouth at bedtime. 10/16/21   [provider]  silodosin (RAPAFLO) 8 MG CAPS capsule Take 8 mg by mouth daily. 03/18/22   [provider]  Vitamin D, Ergocalciferol, (DRISDOL) 1.25 MG (50000 UNIT) CAPS capsule Take 50,000 Units by mouth once a week. Thursdays 09/09/21   [provider]      Allergies    Desmopressin acetate and Zoloft [sertraline]    Review of Systems  Review of Systems  Constitutional:  Positive for activity change, appetite change, fatigue and fever.  HENT:  Negative for congestion and rhinorrhea.   Respiratory:  Positive for shortness of breath. Negative for chest tightness.   Cardiovascular:  Negative for chest pain.  Gastrointestinal:  Negative for abdominal pain, nausea and vomiting.  Genitourinary:  Negative for dysuria and hematuria.  Musculoskeletal:  Positive for arthralgias and myalgias.  Skin:  Negative for rash.  Neurological:  Positive for weakness. Negative for dizziness and headaches.   all other systems are negative except as noted in the HPI and PMH.    Physical Exam Updated Vital  Signs BP (!) 97/56 (BP Location: Right Arm)   Pulse 89   Temp 98.6 F (37 C) (Oral)   Resp 18   Ht 6\' 2"  (1.88 m)   Wt 102.5 kg   SpO2 99%   BMI 29.01 kg/m  Physical Exam Vitals and nursing note reviewed.  Constitutional:      General: He is in acute distress.     Appearance: He is well-developed. He is ill-appearing.     Comments: Mild respiratory distress, confusion but oriented to person and place  HENT:     Head: Normocephalic and atraumatic.     Mouth/Throat:     Pharynx: No oropharyngeal exudate.  Eyes:     Conjunctiva/sclera: Conjunctivae normal.     Pupils: Pupils are equal, round, and reactive to light.  Neck:     Comments: No meningismus. Cardiovascular:     Rate and Rhythm: Normal rate and regular rhythm.     Heart sounds: Normal heart sounds. No murmur heard. Pulmonary:     Effort: Pulmonary effort is normal. No respiratory distress.     Breath sounds: Rhonchi present.  Abdominal:     Palpations: Abdomen is soft.     Tenderness: There is no abdominal tenderness. There is no guarding or rebound.  Musculoskeletal:        General: No tenderness. Normal range of motion.     Cervical back: Normal range of motion and neck supple.  Skin:    General: Skin is warm.  Neurological:     Mental Status: He is alert and oriented to person, place, and time.     Cranial Nerves: No cranial nerve deficit.     Motor: No abnormal muscle tone.     Coordination: Coordination normal.     Comments:  5/5 strength throughout. CN 2-12 intact.Equal grip strength.   Psychiatric:        Behavior: Behavior normal.     ED Results / Procedures / Treatments   Labs (all labs ordered are listed, but only abnormal results are displayed) Labs Reviewed  RESP PANEL BY RT-PCR (RSV, FLU A&B, COVID)  [id number]  CULTURE, BLOOD (ROUTINE X 2)  CULTURE, BLOOD (ROUTINE X 2)  COMPREHENSIVE METABOLIC PANEL  CBC WITH DIFFERENTIAL/PLATELET  PROTIME-INR  APTT  URINALYSIS, W/ REFLEX TO CULTURE  (INFECTION SUSPECTED)  BRAIN NATRIURETIC PEPTIDE  I-STAT CG4 LACTIC ACID, ED  I-STAT ARTERIAL BLOOD GAS, ED    EKG None  Radiology No results found.  Procedures Procedures  {Document cardiac monitor, telemetry assessment procedure when appropriate:1}  Medications Ordered in ED Medications  lactated ringers infusion (has no administration in time range)  lactated ringers bolus 1,000 mL (has no administration in time range)  cefTRIAXone (ROCEPHIN) 2 g in sodium chloride 0.9 % 100 mL IVPB (has no administration in time range)  azithromycin (ZITHROMAX) 500 mg in sodium chloride  0.9 % 250 mL IVPB (has no administration in time range)    ED Course/ Medical Decision Making/ A&P   {   Click here for ABCD2, HEART and other calculatorsREFRESH Note before signing :1}                              Medical Decision Making Amount and/or Complexity of Data Reviewed Independent Historian: EMS Labs: ordered. Decision-making details documented in ED Course. Radiology: ordered and independent interpretation performed. Decision-making details documented in ED Course. ECG/medicine tests: ordered and independent interpretation performed. Decision-making details documented in ED Course.  Risk Prescription drug management.   2 days of respiratory distress, shortness of breath, coughing, fever, hypoxia.  On arrival blood pressure soft 97/56.  Initiate septic protocol with IV fluids and IV antibiotics after cultures are obtained.  Will attempt to wean oxygen to nasal cannula.  {Document critical care time when appropriate:1} {Document review of labs and clinical decision tools ie heart score, Chads2Vasc2 etc:1}  {Document your independent review of radiology images, and any outside records:1} {Document your discussion with family members, caretakers, and with consultants:1} {Document social determinants of health affecting pt's care:1} {Document your decision making why or why not admission,  treatments were needed:1} Final Clinical Impression(s) / ED Diagnoses Final diagnoses:  None    Rx / DC Orders ED Discharge Orders     None

## 2022-11-12 NOTE — ED Triage Notes (Signed)
PT bib [facility] from home. Family called EMS with a concern of PT being lethargic.O2 was originally 85% and then dropped to 70%. PT is lethargic , a+0 x4 , no pain. Temp was 101.5 tympanic. CBG:130.

## 2022-11-13 ENCOUNTER — Encounter (HOSPITAL_COMMUNITY): Payer: Self-pay | Admitting: Family Medicine

## 2022-11-13 ENCOUNTER — Inpatient Hospital Stay (HOSPITAL_COMMUNITY): Payer: Medicare Other

## 2022-11-13 DIAGNOSIS — J690 Pneumonitis due to inhalation of food and vomit: Secondary | ICD-10-CM | POA: Diagnosis present

## 2022-11-13 DIAGNOSIS — I5042 Chronic combined systolic (congestive) and diastolic (congestive) heart failure: Secondary | ICD-10-CM | POA: Diagnosis present

## 2022-11-13 DIAGNOSIS — I3139 Other pericardial effusion (noninflammatory): Secondary | ICD-10-CM

## 2022-11-13 DIAGNOSIS — N400 Enlarged prostate without lower urinary tract symptoms: Secondary | ICD-10-CM | POA: Diagnosis present

## 2022-11-13 DIAGNOSIS — Z79899 Other long term (current) drug therapy: Secondary | ICD-10-CM | POA: Diagnosis not present

## 2022-11-13 DIAGNOSIS — G931 Anoxic brain damage, not elsewhere classified: Secondary | ICD-10-CM | POA: Diagnosis present

## 2022-11-13 DIAGNOSIS — Z888 Allergy status to other drugs, medicaments and biological substances status: Secondary | ICD-10-CM | POA: Diagnosis not present

## 2022-11-13 DIAGNOSIS — N184 Chronic kidney disease, stage 4 (severe): Secondary | ICD-10-CM | POA: Diagnosis present

## 2022-11-13 DIAGNOSIS — R911 Solitary pulmonary nodule: Secondary | ICD-10-CM | POA: Diagnosis present

## 2022-11-13 DIAGNOSIS — I4819 Other persistent atrial fibrillation: Secondary | ICD-10-CM | POA: Diagnosis present

## 2022-11-13 DIAGNOSIS — J440 Chronic obstructive pulmonary disease with acute lower respiratory infection: Secondary | ICD-10-CM | POA: Diagnosis present

## 2022-11-13 DIAGNOSIS — J449 Chronic obstructive pulmonary disease, unspecified: Secondary | ICD-10-CM

## 2022-11-13 DIAGNOSIS — Z66 Do not resuscitate: Secondary | ICD-10-CM | POA: Diagnosis present

## 2022-11-13 DIAGNOSIS — J9600 Acute respiratory failure, unspecified whether with hypoxia or hypercapnia: Secondary | ICD-10-CM | POA: Diagnosis not present

## 2022-11-13 DIAGNOSIS — Y92009 Unspecified place in unspecified non-institutional (private) residence as the place of occurrence of the external cause: Secondary | ICD-10-CM | POA: Diagnosis not present

## 2022-11-13 DIAGNOSIS — G928 Other toxic encephalopathy: Secondary | ICD-10-CM | POA: Diagnosis present

## 2022-11-13 DIAGNOSIS — J1282 Pneumonia due to coronavirus disease 2019: Secondary | ICD-10-CM | POA: Diagnosis present

## 2022-11-13 DIAGNOSIS — U071 COVID-19: Secondary | ICD-10-CM | POA: Diagnosis present

## 2022-11-13 DIAGNOSIS — T43595A Adverse effect of other antipsychotics and neuroleptics, initial encounter: Secondary | ICD-10-CM | POA: Diagnosis present

## 2022-11-13 DIAGNOSIS — G9341 Metabolic encephalopathy: Secondary | ICD-10-CM

## 2022-11-13 DIAGNOSIS — F32A Depression, unspecified: Secondary | ICD-10-CM | POA: Diagnosis present

## 2022-11-13 DIAGNOSIS — Z7901 Long term (current) use of anticoagulants: Secondary | ICD-10-CM | POA: Diagnosis not present

## 2022-11-13 DIAGNOSIS — T426X5A Adverse effect of other antiepileptic and sedative-hypnotic drugs, initial encounter: Secondary | ICD-10-CM | POA: Diagnosis present

## 2022-11-13 DIAGNOSIS — K219 Gastro-esophageal reflux disease without esophagitis: Secondary | ICD-10-CM | POA: Diagnosis present

## 2022-11-13 DIAGNOSIS — I2721 Secondary pulmonary arterial hypertension: Secondary | ICD-10-CM | POA: Diagnosis present

## 2022-11-13 DIAGNOSIS — Z981 Arthrodesis status: Secondary | ICD-10-CM | POA: Diagnosis not present

## 2022-11-13 DIAGNOSIS — Z87891 Personal history of nicotine dependence: Secondary | ICD-10-CM | POA: Diagnosis not present

## 2022-11-13 DIAGNOSIS — J9601 Acute respiratory failure with hypoxia: Secondary | ICD-10-CM | POA: Diagnosis present

## 2022-11-13 LAB — MAGNESIUM: Magnesium: 1.6 mg/dL — ABNORMAL LOW (ref 1.7–2.4)

## 2022-11-13 LAB — D-DIMER, QUANTITATIVE: D-Dimer, Quant: 0.31 ug/mL-FEU (ref 0.00–0.50)

## 2022-11-13 LAB — COMPREHENSIVE METABOLIC PANEL
ALT: 6 U/L (ref 0–44)
ALT: 13 U/L (ref 0–44)
AST: 24 U/L (ref 15–41)
AST: 27 U/L (ref 15–41)
Albumin: 2.9 g/dL — ABNORMAL LOW (ref 3.5–5.0)
Albumin: 3.2 g/dL — ABNORMAL LOW (ref 3.5–5.0)
Alkaline Phosphatase: 73 U/L (ref 38–126)
Alkaline Phosphatase: 81 U/L (ref 38–126)
Anion gap: 11 (ref 5–15)
Anion gap: 13 (ref 5–15)
BUN: 12 mg/dL (ref 8–23)
BUN: 12 mg/dL (ref 8–23)
CO2: 24 mmol/L (ref 22–32)
CO2: 23 mmol/L (ref 22–32)
Calcium: 7.6 mg/dL — ABNORMAL LOW (ref 8.9–10.3)
Calcium: 8.2 mg/dL — ABNORMAL LOW (ref 8.9–10.3)
Chloride: 98 mmol/L (ref 98–111)
Chloride: 98 mmol/L (ref 98–111)
Creatinine, Ser: 1.47 mg/dL — ABNORMAL HIGH (ref 0.61–1.24)
Creatinine, Ser: 1.46 mg/dL — ABNORMAL HIGH (ref 0.61–1.24)
GFR, Estimated: 48 mL/min — ABNORMAL LOW (ref >60)
GFR, Estimated: 48 mL/min — ABNORMAL LOW (ref >60)
Glucose, Bld: 113 mg/dL — ABNORMAL HIGH (ref 70–99)
Glucose, Bld: 135 mg/dL — ABNORMAL HIGH (ref 70–99)
Potassium: 3.7 mmol/L (ref 3.5–5.1)
Potassium: 3.8 mmol/L (ref 3.5–5.1)
Sodium: 133 mmol/L — ABNORMAL LOW (ref 135–145)
Sodium: 134 mmol/L — ABNORMAL LOW (ref 135–145)
Total Bilirubin: 0.7 mg/dL (ref 0.3–1.2)
Total Bilirubin: 0.6 mg/dL (ref 0.3–1.2)
Total Protein: 5.6 g/dL — ABNORMAL LOW (ref 6.5–8.1)
Total Protein: 6.4 g/dL — ABNORMAL LOW (ref 6.5–8.1)

## 2022-11-13 LAB — CBC WITH DIFFERENTIAL/PLATELET
Abs Immature Granulocytes: 0.03 10*3/uL (ref 0.00–0.07)
Abs Immature Granulocytes: 0.03 10*3/uL (ref 0.00–0.07)
Basophils Absolute: 0 10*3/uL (ref 0.0–0.1)
Basophils Absolute: 0 10*3/uL (ref 0.0–0.1)
Basophils Relative: 0 %
Basophils Relative: 0 %
Eosinophils Absolute: 0 10*3/uL (ref 0.0–0.5)
Eosinophils Absolute: 0 10*3/uL (ref 0.0–0.5)
Eosinophils Relative: 0 %
Eosinophils Relative: 0 %
HCT: 34.7 % — ABNORMAL LOW (ref 39.0–52.0)
HCT: 35.4 % — ABNORMAL LOW (ref 39.0–52.0)
Hemoglobin: 11.2 g/dL — ABNORMAL LOW (ref 13.0–17.0)
Hemoglobin: 11.4 g/dL — ABNORMAL LOW (ref 13.0–17.0)
Immature Granulocytes: 1 %
Immature Granulocytes: 1 %
Lymphocytes Relative: 15 %
Lymphocytes Relative: 14 %
Lymphs Abs: 0.9 10*3/uL (ref 0.7–4.0)
Lymphs Abs: 0.8 10*3/uL (ref 0.7–4.0)
MCH: 30.5 pg (ref 26.0–34.0)
MCH: 30.4 pg (ref 26.0–34.0)
MCHC: 32.3 g/dL (ref 30.0–36.0)
MCHC: 32.2 g/dL (ref 30.0–36.0)
MCV: 94.6 fL (ref 80.0–100.0)
MCV: 94.4 fL (ref 80.0–100.0)
Monocytes Absolute: 0.7 10*3/uL (ref 0.1–1.0)
Monocytes Absolute: 0.3 10*3/uL (ref 0.1–1.0)
Monocytes Relative: 12 %
Monocytes Relative: 5 %
Neutro Abs: 4.5 10*3/uL (ref 1.7–7.7)
Neutro Abs: 5 10*3/uL (ref 1.7–7.7)
Neutrophils Relative %: 72 %
Neutrophils Relative %: 80 %
Platelets: 84 10*3/uL — ABNORMAL LOW (ref 150–400)
Platelets: 87 10*3/uL — ABNORMAL LOW (ref 150–400)
RBC: 3.67 MIL/uL — ABNORMAL LOW (ref 4.22–5.81)
RBC: 3.75 MIL/uL — ABNORMAL LOW (ref 4.22–5.81)
RDW: 13.7 % (ref 11.5–15.5)
RDW: 13.7 % (ref 11.5–15.5)
WBC: 6.2 10*3/uL (ref 4.0–10.5)
WBC: 6.1 10*3/uL (ref 4.0–10.5)
nRBC: 0 % (ref 0.0–0.2)
nRBC: 0 % (ref 0.0–0.2)

## 2022-11-13 LAB — URINALYSIS, W/ REFLEX TO CULTURE (INFECTION SUSPECTED)
Bacteria, UA: NONE SEEN
Bilirubin Urine: NEGATIVE
Glucose, UA: >=500 mg/dL — AB
Hgb urine dipstick: NEGATIVE
Ketones, ur: NEGATIVE mg/dL
Leukocytes,Ua: NEGATIVE
Nitrite: NEGATIVE
Protein, ur: NEGATIVE mg/dL
Specific Gravity, Urine: 1.024 (ref 1.005–1.030)
pH: 5 (ref 5.0–8.0)

## 2022-11-13 LAB — BLOOD CULTURE ID PANEL (REFLEXED) - BCID2

## 2022-11-13 LAB — CULTURE, BLOOD (ROUTINE X 2): Special Requests: ADEQUATE

## 2022-11-13 LAB — ECHOCARDIOGRAM COMPLETE
AR max vel: 3.15 cm2
AV Peak grad: 3.3 mmHg
Ao pk vel: 0.91 m/s
Area-P 1/2: 5.54 cm2
Height: 74 in
S' Lateral: 3.9 cm
Weight: 3615.54 oz

## 2022-11-13 LAB — I-STAT CG4 LACTIC ACID, ED
Lactic Acid, Venous: 0.8 mmol/L (ref 0.5–1.9)
Lactic Acid, Venous: 0.8 mmol/L (ref 0.5–1.9)

## 2022-11-13 LAB — C-REACTIVE PROTEIN: CRP: 8.2 mg/dL — ABNORMAL HIGH (ref <1.0)

## 2022-11-13 LAB — BRAIN NATRIURETIC PEPTIDE: B Natriuretic Peptide: 444.6 pg/mL — ABNORMAL HIGH (ref 0.0–100.0)

## 2022-11-13 LAB — RESP PANEL BY RT-PCR (RSV, FLU A&B, COVID)  RVPGX2
Influenza A by PCR: NEGATIVE
Influenza B by PCR: NEGATIVE
Resp Syncytial Virus by PCR: NEGATIVE
SARS Coronavirus 2 by RT PCR: POSITIVE — AB

## 2022-11-13 LAB — PROTIME-INR
INR: 1.6 — ABNORMAL HIGH (ref 0.8–1.2)
Prothrombin Time: 19.1 seconds — ABNORMAL HIGH (ref 11.4–15.2)

## 2022-11-13 LAB — APTT: aPTT: 68 seconds — ABNORMAL HIGH (ref 24–36)

## 2022-11-13 LAB — LACTATE DEHYDROGENASE: LDH: 176 U/L (ref 98–192)

## 2022-11-13 MED ORDER — SODIUM CHLORIDE 0.9 % IV SOLN: INTRAVENOUS | Status: DC

## 2022-11-13 MED ORDER — SODIUM CHLORIDE 0.9 % IV SOLN
200.0000 mg | Freq: Once | INTRAVENOUS | Status: AC
  Administered 2022-11-13: 200 mg via INTRAVENOUS
  Filled 2022-11-13: qty 40

## 2022-11-13 MED ORDER — ACETAMINOPHEN 650 MG RE SUPP: 650.0000 mg | Freq: Four times a day (QID) | RECTAL | Status: DC | PRN

## 2022-11-13 MED ORDER — DULOXETINE HCL 60 MG PO CPEP
60.0000 mg | ORAL_CAPSULE | Freq: Every day | ORAL | Status: DC
  Administered 2022-11-13 – 2022-11-15 (×3): 60 mg via ORAL
  Filled 2022-11-13: qty 1
  Filled 2022-11-13: qty 2
  Filled 2022-11-13: qty 1

## 2022-11-13 MED ORDER — SODIUM CHLORIDE 0.9 % IV SOLN
100.0000 mg | Freq: Every day | INTRAVENOUS | Status: AC
  Administered 2022-11-14 – 2022-11-15 (×2): 100 mg via INTRAVENOUS
  Filled 2022-11-13 (×2): qty 20

## 2022-11-13 MED ORDER — IOHEXOL 350 MG/ML SOLN
75.0000 mL | Freq: Once | INTRAVENOUS | Status: AC | PRN
  Administered 2022-11-13: 75 mL via INTRAVENOUS

## 2022-11-13 MED ORDER — LACTATED RINGERS IV BOLUS
1000.0000 mL | Freq: Once | INTRAVENOUS | Status: AC
  Administered 2022-11-13: 1000 mL via INTRAVENOUS

## 2022-11-13 MED ORDER — METOPROLOL TARTRATE 25 MG PO TABS
25.0000 mg | ORAL_TABLET | Freq: Two times a day (BID) | ORAL | Status: DC
  Administered 2022-11-13 – 2022-11-15 (×4): 25 mg via ORAL
  Filled 2022-11-13 (×5): qty 1

## 2022-11-13 MED ORDER — IPRATROPIUM-ALBUTEROL 0.5-2.5 (3) MG/3ML IN SOLN: 3.0000 mL | RESPIRATORY_TRACT | Status: DC | PRN

## 2022-11-13 MED ORDER — ACETAMINOPHEN 325 MG PO TABS: 650.0000 mg | ORAL_TABLET | Freq: Four times a day (QID) | ORAL | Status: DC | PRN

## 2022-11-13 MED ORDER — APIXABAN 5 MG PO TABS
5.0000 mg | ORAL_TABLET | Freq: Two times a day (BID) | ORAL | Status: DC
  Administered 2022-11-13 – 2022-11-15 (×5): 5 mg via ORAL
  Filled 2022-11-13 (×5): qty 1

## 2022-11-13 MED ORDER — DEXAMETHASONE SODIUM PHOSPHATE 10 MG/ML IJ SOLN
6.0000 mg | INTRAMUSCULAR | Status: DC
  Administered 2022-11-14 (×2): 6 mg via INTRAVENOUS
  Filled 2022-11-13 (×2): qty 1

## 2022-11-13 MED ORDER — SENNOSIDES-DOCUSATE SODIUM 8.6-50 MG PO TABS: 1.0000 | ORAL_TABLET | Freq: Every evening | ORAL | Status: DC | PRN

## 2022-11-13 MED ORDER — PANTOPRAZOLE SODIUM 40 MG PO TBEC
40.0000 mg | DELAYED_RELEASE_TABLET | Freq: Every day | ORAL | Status: DC
  Administered 2022-11-13 – 2022-11-15 (×3): 40 mg via ORAL
  Filled 2022-11-13 (×3): qty 1

## 2022-11-13 MED ORDER — DEXAMETHASONE SODIUM PHOSPHATE 10 MG/ML IJ SOLN
10.0000 mg | Freq: Once | INTRAMUSCULAR | Status: AC
  Administered 2022-11-13: 10 mg via INTRAVENOUS
  Filled 2022-11-13: qty 1

## 2022-11-13 MED ORDER — PERFLUTREN LIPID MICROSPHERE
1.0000 mL | INTRAVENOUS | Status: AC | PRN
  Administered 2022-11-13: 3 mL via INTRAVENOUS

## 2022-11-13 MED ORDER — METRONIDAZOLE 500 MG/100ML IV SOLN
500.0000 mg | Freq: Once | INTRAVENOUS | Status: AC
  Administered 2022-11-13: 500 mg via INTRAVENOUS
  Filled 2022-11-13: qty 100

## 2022-11-13 NOTE — H&P (Addendum)
PCP:   Darrow Bussing, MD   Chief Complaint:  Hypoxia  HPI: This is a 82 year old male with past medical history COPD, combined systolic and diastolic heart failure, persistent atrial fibrillation, depression.  Patient lives alone.  This morning he called his daughter-in-law telling her he did not feel well and he could not stay awake.  He was a bit hard to understand therefore she asked his son to visit.  The patient appeared fine at the time.  In the afternoon patient again called his daughter-in-law saying he had not eaten all day and he was too weak to get up.  She went over, patient's oxygen sat was 77% on room air.  He could hardly breathe.  911 was called, patient eventually agreed to come to the hospital.  In the ER presenting vitals 97/65, 89, 18.  In the ER patient's 70% on room air, placed on nonrebreather.  Patient is COVID-positive.  Chest x-ray clear  History provided by son present at bedside.  His son works as a Electrical engineer at [employer].  They are unclear as to the patient's exact medications, to bring meds in a.m.  Review of Systems:  Per HPI  Past Medical History: Past Medical History:  Diagnosis Date   BPH (benign prostatic hyperplasia)    Chronic combined systolic and diastolic congestive heart failure (HCC)    GERD (gastroesophageal reflux disease)    protonix for control   Insomnia    Neck pain    Neuropathy    Past Surgical History:  Procedure Laterality Date   ANTERIOR CERVICAL DECOMPRESSION/DISCECTOMY FUSION 4 LEVELS N/A 10/27/2012   Procedure: Cervical Three-Four Cervical Four-Five Cervical Five-Six Cervical Six-Seven  Anterior cervical decompression/diskectomy/fusion;  Surgeon: Maeola Harman, MD;  Location: [facility];  Service: Neurosurgery;  Laterality: N/A;  Cervical Three-Four Cervical Four-Five Cervical Five-Six Cervical Six-Seven  Anterior cervical decompression/diskectomy/fusion   CARDIOVERSION N/A 09/20/2020   Procedure: CARDIOVERSION;  Surgeon:  Little Ishikawa, MD;  Location: [facility] ENDOSCOPY;  Service: Cardiovascular;  Laterality: N/A;   FEMUR IM NAIL Right 03/08/2020   Procedure: INTRAMEDULLARY (IM) RETROGRADE FEMORAL NAILING;  Surgeon: Roby Lofts, MD;  Location: [facility] OR;  Service: Orthopedics;  Laterality: Right;   SHOULDER ARTHROSCOPY  2010   rt shoulder   SHOULDER ARTHROSCOPY  02/15/2011   Procedure: ARTHROSCOPY SHOULDER;  Surgeon: Erasmo Leventhal;  Location: [facility];  Service: Orthopedics;  Laterality: Right;  Debridement   TONSILLECTOMY      Medications: Prior to Admission medications   Medication Sig Start Date End Date Taking? Authorizing Provider  apixaban (ELIQUIS) 5 MG TABS tablet Take 5 mg by mouth 2 (two) times daily. 06/23/20  Yes [provider]  DENTA 5000 PLUS 1.1 % CREA dental cream Place 1 Application onto teeth 2 (two) times daily. 06/18/21  Yes [provider]  DULoxetine (CYMBALTA) 60 MG capsule Take 60 mg by mouth daily. 05/08/22  Yes [provider]  FARXIGA 10 MG TABS tablet Take 10 mg by mouth daily. 10/10/21  Yes [provider]  gabapentin (NEURONTIN) 300 MG capsule Take 2 capsules (600 mg total) by mouth 3 (three) times daily. 600 mg 4 times a day, and 1200 mg at bedtime. Patient taking differently: Take 600-1,200 mg by mouth 3 (three) times daily. Take 600 mg by mouth in the morning and in the afternoon. Take 1200 mg by mouth at bedtime. 11/02/21  Yes Rodolph Bong, MD  ibuprofen (ADVIL) 200 MG tablet Take 400 mg  by mouth as needed for moderate pain.   Yes [provider]  metoprolol tartrate (LOPRESSOR) 25 MG tablet Take 25 mg by mouth 2 (two) times daily. 06/23/20  Yes [provider]  Multiple Vitamin (MULTIVITAMIN WITH MINERALS) TABS tablet Take 1 tablet by mouth daily. 03/18/20  Yes Myrtie Neither, MD  Oxycodone HCl 20 MG TABS Take 20 mg by mouth in the morning, at noon, and at bedtime.   Yes [provider]   pantoprazole (PROTONIX) 40 MG tablet Take 1 tablet (40 mg total) by mouth daily. 03/18/20  Yes Myrtie Neither, MD  QUEtiapine (SEROQUEL) 300 MG tablet Take 600 mg by mouth at bedtime. 10/16/21  Yes [provider]  Vitamin D, Ergocalciferol, (DRISDOL) 1.25 MG (50000 UNIT) CAPS capsule Take 50,000 Units by mouth once a week. Thursdays 09/09/21  Yes [provider]  azithromycin (ZITHROMAX) 250 MG tablet Take 1 tablet by mouth daily until gone 06/13/22   Azucena Fallen, MD  ferrous sulfate 325 (65 FE) MG tablet Take 1 tablet (325 mg total) by mouth daily with breakfast. 06/14/22   Azucena Fallen, MD  silodosin (RAPAFLO) 8 MG CAPS capsule Take 8 mg by mouth daily. 03/18/22   [provider]    Allergies:   Allergies  Allergen Reactions   Desmopressin Acetate Other (See Comments)    Hyponatremia    Zoloft [Sertraline] Other (See Comments)    Felt bad - Patient denies    Social History:  reports that he quit smoking about 32 years ago. His smoking use included cigarettes. He has never used smokeless tobacco. He reports that he does not drink alcohol and does not use drugs.  Family History: Family History  Problem Relation Age of Onset   Emphysema Mother    Cirrhosis Father    Stroke Brother     Physical Exam: Vitals:   11/12/22 2239 11/12/22 2252  BP: (!) 97/56   Pulse: 89   Resp: 18   Temp: 98.6 F (37 C)   TempSrc: Oral   SpO2: 99%   Weight:  102.5 kg  Height:  6\' 2"  (1.88 m)    General: A and O x 2, weak appearing gentleman Eyes: Pink conjunctiva, no scleral icterus ENT: Moist oral mucosa, neck supple, no thyromegaly Lungs: Tight lungs, poor air exchange, no use of accessory muscles Cardiovascular: RRR, no murmurs. No carotid bruits, no JVD Abdomen: soft, positive BS, non-tender, non-distended, not an acute abdomen GU: not examined Neuro: CN II - XII grossly intact Musculoskeletal: strength 5/5 all extremities, no edema Skin: no  rash, no subcutaneous crepitation, no decubitus Psych: Weak patient  Labs on Admission:  Recent Labs    11/12/22 2328  NA 134*  K 3.5    Recent Labs    11/12/22 2328 11/12/22 2335  WBC  --  6.2  NEUTROABS  --  4.5  HGB 11.2* 11.2*  HCT 33.0* 34.7*  MCV  --  94.6  PLT  --  84*    Micro Results: Recent Results (from the past 240 hour(s))  Resp panel by RT-PCR (RSV, Flu A&B, Covid) Anterior Nasal Swab     Status: Abnormal   Collection Time: 11/12/22 11:13 PM   Specimen: Anterior Nasal Swab  Result Value Ref Range Status   SARS Coronavirus 2 by RT PCR POSITIVE (A) NEGATIVE Final   Influenza A by PCR NEGATIVE NEGATIVE Final   Influenza B by PCR NEGATIVE NEGATIVE Final    Comment: (NOTE) The Xpert  Xpress SARS-CoV-2/FLU/RSV plus assay is intended as an aid in the diagnosis of influenza from Nasopharyngeal swab specimens and should not be used as a sole basis for treatment. Nasal washings and aspirates are unacceptable for Xpert Xpress SARS-CoV-2/FLU/RSV testing.  Fact Sheet for Patients: BloggerCourse.com  Fact Sheet for Healthcare Providers: SeriousBroker.it  This test is not yet approved or cleared by the [place] FDA and has been authorized for detection and/or diagnosis of SARS-CoV-2 by FDA under an Emergency Use Authorization (EUA). This EUA will remain in effect (meaning this test can be used) for the duration of the COVID-19 declaration under Section 564(b)(1) of the Act, 21 U.S.C. section 360bbb-3(b)(1), unless the authorization is terminated or revoked.     Resp Syncytial Virus by PCR NEGATIVE NEGATIVE Final    Comment: (NOTE) Fact Sheet for Patients: BloggerCourse.com  Fact Sheet for Healthcare Providers: SeriousBroker.it  This test is not yet approved or cleared by the [place] FDA and has been authorized for detection and/or diagnosis of  SARS-CoV-2 by FDA under an Emergency Use Authorization (EUA). This EUA will remain in effect (meaning this test can be used) for the duration of the COVID-19 declaration under Section 564(b)(1) of the Act, 21 U.S.C. section 360bbb-3(b)(1), unless the authorization is terminated or revoked.  Performed at [facility], [address], [place], [place] [address]      Radiological Exams on Admission: DG Chest Port 1 View  Result Date: 11/12/2022 CLINICAL DATA:  Possible sepsis, respiratory distress, fever. Lethargy. EXAM: PORTABLE CHEST 1 VIEW COMPARISON:  07/08/2022. FINDINGS: The heart is enlarged and mediastinal contours are within normal limits. There is atherosclerotic calcification of the aorta. Hyperinflation of the lungs is noted. Mild airspace disease is noted at the lung bases bilaterally. No effusion or pneumothorax. There is partial visualization of cervical spinal fusion hardware. IMPRESSION: Minimal atelectasis or infiltrate at the lung bases. Electronically Signed   By: Thornell Sartorius M.D.   On: 11/12/2022 23:39    Assessment/Plan Present on Admission:  acute respiratory failure d/t Covid  -Continue Decadron 6 mg IV daily -Inflammatory markers, D-dimer, LDH, CRP ordered -Oxygen, keep sats greater 88% -Nebulizers every 2 hours as needed -Respiratory consult on board -Remdesivir ordered -Patient received IV antibiotics Rocephin and azithromycin in the ER.  Rocephin continued   Acute metabolic encephalopathy -Improving.  Due to above -Will hold Neurontin and oxycodone and Seroquel   Aspiration pneumonia -CT chest shows debris in proximal esophagus -N.p.o., speech eval ordered -IV Rocephin ordered   Left lower lobe pulmonary nodule -6.48mm.  Follow-up CT in 3 months   Small pericardial effusion -2D echo in a.m.    COPD -No wheeze, but tight lungs.  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

## 2022-11-13 NOTE — Evaluation (Signed)
Clinical/Bedside Swallow Evaluation Patient Details  Name: Dustin [name] MRN: [id number] Date of Birth: Nov 29, 1940  Today's Date: 11/13/2022 Time: SLP Start Time (ACUTE ONLY): 1206 SLP Stop Time (ACUTE ONLY): 1220 SLP Time Calculation (min) (ACUTE ONLY): 14 min  Past Medical History:  Past Medical History:  Diagnosis Date   BPH (benign prostatic hyperplasia)    Chronic combined systolic and diastolic congestive heart failure (HCC)    GERD (gastroesophageal reflux disease)    protonix for control   Insomnia    Neck pain    Neuropathy    Past Surgical History:  Past Surgical History:  Procedure Laterality Date   ANTERIOR CERVICAL DECOMPRESSION/DISCECTOMY FUSION 4 LEVELS N/A 10/27/2012   Procedure: Cervical Three-Four Cervical Four-Five Cervical Five-Six Cervical Six-Seven  Anterior cervical decompression/diskectomy/fusion;  Surgeon: Dustin Harman, MD;  Location: [facility];  Service: Neurosurgery;  Laterality: N/A;  Cervical Three-Four Cervical Four-Five Cervical Five-Six Cervical Six-Seven  Anterior cervical decompression/diskectomy/fusion   CARDIOVERSION N/A 09/20/2020   Procedure: CARDIOVERSION;  Surgeon: Dustin Ishikawa, MD;  Location: [facility] ENDOSCOPY;  Service: Cardiovascular;  Laterality: N/A;   FEMUR IM NAIL Right 03/08/2020   Procedure: INTRAMEDULLARY (IM) RETROGRADE FEMORAL NAILING;  Surgeon: Dustin Lofts, MD;  Location: [facility] OR;  Service: Orthopedics;  Laterality: Right;   SHOULDER ARTHROSCOPY  2010   rt shoulder   SHOULDER ARTHROSCOPY  02/15/2011   Procedure: ARTHROSCOPY SHOULDER;  Surgeon: Dustin [name];  Location: [facility];  Service: Orthopedics;  Laterality: Right;  Debridement   TONSILLECTOMY     HPI:  Pt is an 82 year old male admitted with acute respiratory failure secondary to COVID 19. CT scan showed debris in the trachea and the mainstem bronchi, greater on the  right than on the left with patchy airspace disease in the lower  lobes  bilaterally, concerning for aspiration pneumonia.  3. Small bilateral pleural effusions.  4. Emphysema.  5. Debris in the proximal esophagus, compatible with history of  GERD.  BSE ordered.    Assessment / Plan / Recommendation  Clinical Impression  Pt demonstrates no immediate signs of aspiration.  He denies any signs of dysphagia and appears cognitively intact. His CT scan is suggestive of aspiration pna but given debris in trachea and esophagus, acute illness and pt hx of GERD, would suspect postprandial aspiration over oropharyngeal dysphagia as a source. Recommend pt initiate a regular diet and thin liquids and potentially f/u for esophagram for instrumental assessment of esophageal function when appropriate. SLP will sign off acutely. SLP Visit Diagnosis: Dysphagia, oropharyngeal phase (R13.12)    Aspiration Risk       Diet Recommendation Regular;Thin liquid    Liquid Administration via: Cup;Straw Medication Administration: Whole meds with liquid Supervision: Patient able to self feed Postural Changes: Seated upright at 90 degrees;Remain upright for at least 30 minutes after po intake    Other  Recommendations Oral Care Recommendations: Oral care BID    Recommendations for follow up therapy are one component of a multi-disciplinary discharge planning process, led by the attending physician.  Recommendations may be updated based on patient status, additional functional criteria and insurance authorization.  Follow up Recommendations No SLP follow up      Assistance Recommended at Discharge    Functional Status Assessment    Frequency and Duration            Prognosis        Swallow Study   General HPI: Pt is an 82 year old male  admitted with acute respiratory failure secondary to COVID 19. CT scan showed debris in the trachea and the mainstem bronchi, greater on the  right than on the left with patchy airspace disease in the lower  lobes bilaterally, concerning for  aspiration pneumonia.  3. Small bilateral pleural effusions.  4. Emphysema.  5. Debris in the proximal esophagus, compatible with history of  GERD.  BSE ordered. Type of Study: Bedside Swallow Evaluation Previous Swallow Assessment: none in chart Diet Prior to this Study: Regular;Thin liquids (Level 0) Temperature Spikes Noted: No Respiratory Status: Nasal cannula History of Recent Intubation: No Behavior/Cognition: Alert;Cooperative;Pleasant mood Oral Cavity Assessment: Within Functional Limits Oral Care Completed by SLP: No Oral Cavity - Dentition: Adequate natural dentition Vision: Functional for self-feeding Self-Feeding Abilities: Able to feed self Patient Positioning: Upright in bed Baseline Vocal Quality: Normal Volitional Cough: Strong Volitional Swallow: Able to elicit    Oral/Motor/Sensory Function     Ice Chips     Thin Liquid Thin Liquid: Within functional limits Presentation: Cup;Straw    Nectar Thick Nectar Thick Liquid: Not tested   Honey Thick Honey Thick Liquid: Not tested   Puree Puree: Within functional limits   Solid     Solid: Within functional limits      Dustin [name], Dustin [name] 11/13/2022,12:25 PM

## 2022-11-13 NOTE — ED Notes (Signed)
SLP at bedside.

## 2022-11-13 NOTE — Sepsis Progress Note (Signed)
Following for sepsis monitoring ?

## 2022-11-13 NOTE — Progress Notes (Signed)
PROGRESS NOTE    Dustin [name]  WRU:[id number] DOB: 1940-10-22 DOA: 11/12/2022 PCP: Darrow Bussing, MD   Brief Narrative: Dustin [name] is a 82 y.o. male with a history of COPD, combined systolic and diastolic heart failure, persistent atrial fibrillation, depression.  Patient presented secondary to hypoxia with oxygen down to 77% on room air as an outpatient with associated respiratory distress.  Patient was found to be COVID-positive with associated pneumonia concerning for aspiration.  Patient required nonrebreather on presentation and has been weaned to nasal cannula.   Assessment/Plan:  Acute respiratory failure with hypoxia Secondary to pneumonia.  Patient requiring nonrebreather on presentation.  Reported oxygen with SpO2 down to 77% on room air with associated respiratory distress. -Wean to room air as able -Ambulatory pulse ox prior to discharge  COVID-19 infection Presumed possible COVID-19 pneumonia.  Patient started on Decadron IV and remdesivir on admission.  CRP elevated at 8.2.  D-dimer of 0.31 on admission. -Continue Decadron and remdesivir -Airborne/contact precautions x 10 days from date of diagnosis  Aspiration pneumonia CT imaging is suggestive of aspiration pneumonia secondary to evidence of debris noted in the trachea.  Patient started empirically on ceftriaxone on admission for treatment.  Speech therapy consulted with recommendation for regular diet. -Continue ceftriaxone -Check esophagram  Acute hypoxic encephalopathy Secondary to above.  Encephalopathy could also be related to medication use as patient is on high-dose Seroquel in addition to OxyContin, gabapentin as an outpatient.  Appears to have resolved.  Pulmonary nodule Located in the left lower lobe, measuring 6.9 mm.  Recommendation for follow-up CT scanning of the chest in 3 months.  Small pericardial effusion Noted on CT imaging.  Echocardiogram ordered and is pending. -Follow-up  echocardiogram  COPD Stable.  No evidence of exacerbation. -Continue DuoNeb as needed  Persistent atrial fibrillation Rate controlled.  Patient is on Eliquis and metoprolol as an outpatient.  Metoprolol held on admission. -Resume metoprolol and continue Eliquis  Depression -Continue Cymbalta  CKD stage IV Stable.   DVT prophylaxis: Eliquis Code Status:   Code Status: Do not attempt resuscitation (DNR) - Comfort care Family Communication: None at bedside Disposition Plan: Discharge likely in 2 to 3 days pending completion of remdesivir outpatient antibiotic regimen, and ability to wean oxygen to room air as able   Consultants:  None  Procedures:  None  Antimicrobials: Ceftriaxone Remdesivir   Subjective: Patient reports no specific issues this morning.  Breathing well  Objective: BP 124/79   Pulse 88   Temp 98.2 F (36.8 C) (Oral)   Resp 19   Ht 6\' 2"  (1.88 m)   Wt 102.5 kg   SpO2 95%   BMI 29.01 kg/m   Examination:  General exam: Appears calm and comfortable Respiratory system: Clear to auscultation. Respiratory effort normal. Cardiovascular system: S1 & S2 heard. Gastrointestinal system: Abdomen is nondistended, soft and nontender. Normal bowel sounds heard. Central nervous system: Alert and oriented. No focal neurological deficits. Musculoskeletal: No edema. No calf tenderness Skin: No cyanosis. No rashes Psychiatry: Judgement and insight appear normal. Mood & affect appropriate.    Data Reviewed: I have personally reviewed following labs and imaging studies   Last CBC Lab Results  Component Value Date   WBC 6.1 11/13/2022   HGB 11.4 (L) 11/13/2022   HCT 35.4 (L) 11/13/2022   MCV 94.4 11/13/2022   MCH 30.4 11/13/2022   RDW 13.7 11/13/2022   PLT 87 (L) 11/13/2022     Last metabolic panel Lab Results  Component Value Date   GLUCOSE 135 (H) 11/13/2022   NA 134 (L) 11/13/2022   K 3.8 11/13/2022   CL 98 11/13/2022   CO2 23 11/13/2022    BUN 12 11/13/2022   CREATININE 1.46 (H) 11/13/2022   GFRNONAA 48 (L) 11/13/2022   CALCIUM 8.2 (L) 11/13/2022   PROT 6.4 (L) 11/13/2022   ALBUMIN 3.2 (L) 11/13/2022   LABGLOB 2.1 01/30/2015   AGRATIO 2.0 01/30/2015   BILITOT 0.6 11/13/2022   ALKPHOS 81 11/13/2022   AST 27 11/13/2022   ALT 13 11/13/2022   ANIONGAP 13 11/13/2022     Creatinine Clearance: Estimated Creatinine Clearance: 50.7 mL/min (A) (by C-G formula based on SCr of 1.46 mg/dL (H)).  Recent Results (from the past 240 hour(s))  Resp panel by RT-PCR (RSV, Flu A&B, Covid) Anterior Nasal Swab     Status: Abnormal   Collection Time: 11/12/22 11:13 PM   Specimen: Anterior Nasal Swab  Result Value Ref Range Status   SARS Coronavirus 2 by RT PCR POSITIVE (A) NEGATIVE Final   Influenza A by PCR NEGATIVE NEGATIVE Final   Influenza B by PCR NEGATIVE NEGATIVE Final    Comment: (NOTE) The Xpert Xpress SARS-CoV-2/FLU/RSV plus assay is intended as an aid in the diagnosis of influenza from Nasopharyngeal swab specimens and should not be used as a sole basis for treatment. Nasal washings and aspirates are unacceptable for Xpert Xpress SARS-CoV-2/FLU/RSV testing.  Fact Sheet for Patients: BloggerCourse.com  Fact Sheet for Healthcare Providers: SeriousBroker.it  This test is not yet approved or cleared by the [place] FDA and has been authorized for detection and/or diagnosis of SARS-CoV-2 by FDA under an Emergency Use Authorization (EUA). This EUA will remain in effect (meaning this test can be used) for the duration of the COVID-19 declaration under Section 564(b)(1) of the Act, 21 U.S.C. section 360bbb-3(b)(1), unless the authorization is terminated or revoked.     Resp Syncytial Virus by PCR NEGATIVE NEGATIVE Final    Comment: (NOTE) Fact Sheet for Patients: BloggerCourse.com  Fact Sheet for Healthcare  Providers: SeriousBroker.it  This test is not yet approved or cleared by the [place] FDA and has been authorized for detection and/or diagnosis of SARS-CoV-2 by FDA under an Emergency Use Authorization (EUA). This EUA will remain in effect (meaning this test can be used) for the duration of the COVID-19 declaration under Section 564(b)(1) of the Act, 21 U.S.C. section 360bbb-3(b)(1), unless the authorization is terminated or revoked.  Performed at [facility], [address], [place], [place] [address]       Radiology Studies: CT Angio Chest PE W and/or Wo Contrast  Result Date: 11/13/2022 CLINICAL DATA:  Pulmonary embolism suspected, high probability. History of heart failure, GERD, atrial fibrillation on Eliquis, with 1 day history of shortness of breath, hypoxia, and decreased mental status. EXAM: CT ANGIOGRAPHY CHEST WITH CONTRAST TECHNIQUE: Multidetector CT imaging of the chest was performed using the standard protocol during bolus administration of intravenous contrast. Multiplanar CT image reconstructions and MIPs were obtained to evaluate the vascular anatomy. RADIATION DOSE REDUCTION: This exam was performed according to the departmental dose-optimization program which includes automated exposure control, adjustment of the mA and/or kV according to patient size and/or use of iterative reconstruction technique. CONTRAST:  75mL OMNIPAQUE IOHEXOL 350 MG/ML SOLN COMPARISON:  06/11/2022. FINDINGS: Cardiovascular: The heart is enlarged and there is a small pericardial effusion. Scattered coronary artery calcifications are noted. There is atherosclerotic calcification of the aorta without evidence of  aneurysm. Pulmonary trunk is distended suggesting underlying pulmonary artery hypertension. No evidence of pulmonary embolism. Mediastinum/Nodes: Enlarged lymph nodes are present in the mediastinum measuring up to 1.5 cm in the subcarinal space. There is an  enlarged right hilar lymph node measuring 1.2 cm. No axillary lymphadenopathy. The thyroid gland is within normal limits. Debris is noted in the proximal esophagus, compatible with history of gastroesophageal reflux disease. Lungs/Pleura: Debris is noted in the posterior trachea extending into the bronchi bilaterally, greater on the right than on the left. Bilateral bronchial wall thickening is present. Paraseptal and centrilobular emphysematous changes are noted in the lungs. Apical pleural and parenchymal scarring is present bilaterally. There is bilateral lower lobe consolidation. There are small bilateral pleural effusions. No pneumothorax is seen. A 9 mm nodule is noted in the left lower lobe, axial image 99. Upper Abdomen: Small stones are noted in the gallbladder. No acute abnormality. Musculoskeletal: Cervical spinal fusion hardware is noted. Degenerative changes are present in the thoracic spine. No acute or suspicious osseous abnormality. Review of the MIP images confirms the above findings. IMPRESSION: 1. No evidence of pulmonary embolism. 2. Debris in the trachea and the mainstem bronchi, greater on the right than on the left with patchy airspace disease in the lower lobes bilaterally, concerning for aspiration pneumonia. 3. Small bilateral pleural effusions. 4. Emphysema. 5. Debris in the proximal esophagus, compatible with history of GERD. 6. 9 mm left lower lobe pulmonary nodule. Consider one of the following in 3 months for both low-risk and high-risk individuals: (a) repeat chest CT, (b) follow-up PET-CT, or (c) tissue sampling. This recommendation follows the consensus statement: Guidelines for Management of Incidental Pulmonary Nodules Detected on CT Images: From the Fleischner Society 2017; Radiology 2017; 284:228-243. 7. Cardiomegaly with small pericardial effusion and coronary artery calcifications. 8. Distended pulmonary trunk suggesting underlying pulmonary artery hypertension. 9.  Cholelithiasis. 10. Aortic atherosclerosis. Electronically Signed   By: Thornell Sartorius M.D.   On: 11/13/2022 03:27   DG Chest Port 1 View  Result Date: 11/12/2022 CLINICAL DATA:  Possible sepsis, respiratory distress, fever. Lethargy. EXAM: PORTABLE CHEST 1 VIEW COMPARISON:  07/08/2022. FINDINGS: The heart is enlarged and mediastinal contours are within normal limits. There is atherosclerotic calcification of the aorta. Hyperinflation of the lungs is noted. Mild airspace disease is noted at the lung bases bilaterally. No effusion or pneumothorax. There is partial visualization of cervical spinal fusion hardware. IMPRESSION: Minimal atelectasis or infiltrate at the lung bases. Electronically Signed   By: Thornell Sartorius M.D.   On: 11/12/2022 23:39      LOS: 0 days    Jacquelin Hawking, MD [facility] 11/13/2022, 12:45 PM   If 7PM-7AM, please contact night-coverage [email or web address]

## 2022-11-13 NOTE — ED Notes (Signed)
ED TO INPATIENT HANDOFF REPORT  ED Nurse Name and Phone #: [phone] [id number]  S Name/Age/Gender Carolynn Comment 82 y.o. male Room/Bed: 044C/044C  Code Status   Code Status: Do not attempt resuscitation (DNR) - Comfort care  Home/SNF/Other Home Patient oriented to: self, place, time, and situation Is this baseline? Yes   Triage Complete: Triage complete  Chief Complaint Acute respiratory failure due to COVID-19 (HCC) [U07.1, J96.00] Acute respiratory failure with hypoxia (HCC) [J96.01]  Triage Note PT bib [facility] from home. Family called EMS with a concern of PT being lethargic.O2 was originally 85% and then dropped to 70%. PT is lethargic , a+0 x4 , no pain. Temp was 101.5 tympanic. CBG:130.   Allergies Allergies  Allergen Reactions   Desmopressin Acetate Other (See Comments)    Hyponatremia    Zoloft [Sertraline] Other (See Comments)    Felt bad - Patient denies    Level of Care/Admitting Diagnosis ED Disposition     ED Disposition  Admit   Condition  --   Comment  Hospital [facility]: [facility] [[id number]]  Level of Care: Med-Surg [16]  May admit patient to [facility] or [facility] if equivalent level of care is available:: No  Covid Evaluation: Confirmed COVID Positive  Diagnosis: Acute respiratory failure with hypoxia [facility]) [[id number]]  Admitting Physician: Narda Bonds [phone]  Attending Physician: Narda Bonds [phone]  Certification:: I certify this patient will need inpatient services for at least 2 midnights          B Medical/Surgery History Past Medical History:  Diagnosis Date   BPH (benign prostatic hyperplasia)    Chronic combined systolic and diastolic congestive heart failure (HCC)    GERD (gastroesophageal reflux disease)    protonix for control   Insomnia    Neck pain    Neuropathy    Past Surgical History:  Procedure Laterality Date   ANTERIOR CERVICAL DECOMPRESSION/DISCECTOMY FUSION 4 LEVELS N/A 10/27/2012   Procedure:  Cervical Three-Four Cervical Four-Five Cervical Five-Six Cervical Six-Seven  Anterior cervical decompression/diskectomy/fusion;  Surgeon: Maeola Harman, MD;  Location: [facility]S;  Service: Neurosurgery;  Laterality: N/A;  Cervical Three-Four Cervical Four-Five Cervical Five-Six Cervical Six-Seven  Anterior cervical decompression/diskectomy/fusion   CARDIOVERSION N/A 09/20/2020   Procedure: CARDIOVERSION;  Surgeon: Little Ishikawa, MD;  Location: [facility] ENDOSCOPY;  Service: Cardiovascular;  Laterality: N/A;   FEMUR IM NAIL Right 03/08/2020   Procedure: INTRAMEDULLARY (IM) RETROGRADE FEMORAL NAILING;  Surgeon: Roby Lofts, MD;  Location: [facility] OR;  Service: Orthopedics;  Laterality: Right;   SHOULDER ARTHROSCOPY  2010   rt shoulder   SHOULDER ARTHROSCOPY  02/15/2011   Procedure: ARTHROSCOPY SHOULDER;  Surgeon: Erasmo Leventhal;  Location: [facility];  Service: Orthopedics;  Laterality: Right;  Debridement   TONSILLECTOMY       A IV Location/Drains/Wounds Patient Lines/Drains/Airways Status     Active Line/Drains/Airways     Name Placement date Placement time Site Days   Peripheral IV 11/12/22 20 G Left Antecubital 11/12/22  2000  Antecubital  1            Intake/Output Last 24 hours  Intake/Output Summary (Last 24 hours) at 11/13/2022 1329 Last data filed at 11/13/2022 0853 Gross per 24 hour  Intake 2742.75 ml  Output 1000 ml  Net 1742.75 ml    Labs/Imaging Results for orders placed or performed during the hospital encounter of 11/12/22 (from the past 48 hour(s))  Resp panel by RT-PCR (RSV, Flu A&B,  Covid) Anterior Nasal Swab     Status: Abnormal   Collection Time: 11/12/22 11:13 PM   Specimen: Anterior Nasal Swab  Result Value Ref Range   SARS Coronavirus 2 by RT PCR POSITIVE (A) NEGATIVE   Influenza A by PCR NEGATIVE NEGATIVE   Influenza B by PCR NEGATIVE NEGATIVE    Comment: (NOTE) The Xpert Xpress SARS-CoV-2/FLU/RSV plus assay is intended as an  aid in the diagnosis of influenza from Nasopharyngeal swab specimens and should not be used as a sole basis for treatment. Nasal washings and aspirates are unacceptable for Xpert Xpress SARS-CoV-2/FLU/RSV testing.  Fact Sheet for Patients: BloggerCourse.com  Fact Sheet for Healthcare Providers: SeriousBroker.it  This test is not yet approved or cleared by the [place] FDA and has been authorized for detection and/or diagnosis of SARS-CoV-2 by FDA under an Emergency Use Authorization (EUA). This EUA will remain in effect (meaning this test can be used) for the duration of the COVID-19 declaration under Section 564(b)(1) of the Act, 21 U.S.C. section 360bbb-3(b)(1), unless the authorization is terminated or revoked.     Resp Syncytial Virus by PCR NEGATIVE NEGATIVE    Comment: (NOTE) Fact Sheet for Patients: BloggerCourse.com  Fact Sheet for Healthcare Providers: SeriousBroker.it  This test is not yet approved or cleared by the [place] FDA and has been authorized for detection and/or diagnosis of SARS-CoV-2 by FDA under an Emergency Use Authorization (EUA). This EUA will remain in effect (meaning this test can be used) for the duration of the COVID-19 declaration under Section 564(b)(1) of the Act, 21 U.S.C. section 360bbb-3(b)(1), unless the authorization is terminated or revoked.  Performed at [facility], [address] [place], [place] [address]   I-Stat arterial blood gas, ED [facility] ED, [facility], [facility])     Status: Abnormal   Collection Time: 11/12/22 11:28 PM  Result Value Ref Range   pH, Arterial 7.380 7.35 - 7.45   pCO2 arterial 44.9 32 - 48 mmHg   pO2, Arterial 57 (L) 83 - 108 mmHg   Bicarbonate 26.5 20.0 - 28.0 mmol/L   TCO2 28 22 - 32 mmol/L   O2 Saturation 88 %   Acid-Base Excess 1.0 0.0 - 2.0 mmol/L   Sodium 134 (L) 135 - 145 mmol/L   Potassium 3.5  3.5 - 5.1 mmol/L   Calcium, Ion 1.17 1.15 - 1.40 mmol/L   HCT 33.0 (L) 39.0 - 52.0 %   Hemoglobin 11.2 (L) 13.0 - 17.0 g/dL   Patient temperature 13.0 F    Collection site RADIAL, ALLEN'S TEST ACCEPTABLE    Drawn by RT    Sample type ARTERIAL   CBC with Differential     Status: Abnormal   Collection Time: 11/12/22 11:35 PM  Result Value Ref Range   WBC 6.2 4.0 - 10.5 K/uL   RBC 3.67 (L) 4.22 - 5.81 MIL/uL   Hemoglobin 11.2 (L) 13.0 - 17.0 g/dL   HCT 86.5 (L) 78.4 - 69.6 %   MCV 94.6 80.0 - 100.0 fL   MCH 30.5 26.0 - 34.0 pg   MCHC 32.3 30.0 - 36.0 g/dL   RDW 29.5 28.4 - 13.2 %   Platelets 84 (L) 150 - 400 K/uL    Comment: Immature Platelet Fraction may be clinically indicated, consider ordering this additional test [id number] REPEATED TO VERIFY    nRBC 0.0 0.0 - 0.2 %   Neutrophils Relative % 72 %   Neutro Abs 4.5 1.7 - 7.7 K/uL   Lymphocytes  Relative 15 %   Lymphs Abs 0.9 0.7 - 4.0 K/uL   Monocytes Relative 12 %   Monocytes Absolute 0.7 0.1 - 1.0 K/uL   Eosinophils Relative 0 %   Eosinophils Absolute 0.0 0.0 - 0.5 K/uL   Basophils Relative 0 %   Basophils Absolute 0.0 0.0 - 0.1 K/uL   Immature Granulocytes 1 %   Abs Immature Granulocytes 0.03 0.00 - 0.07 K/uL    Comment: Performed at [facility], [address], [place], [place] [address]  Protime-INR     Status: Abnormal   Collection Time: 11/12/22 11:35 PM  Result Value Ref Range   Prothrombin Time 19.1 (H) 11.4 - 15.2 seconds   INR 1.6 (H) 0.8 - 1.2    Comment: (NOTE) INR goal varies based on device and disease states. Performed at [facility], [address], [place], [place] [address]   APTT     Status: Abnormal   Collection Time: 11/12/22 11:35 PM  Result Value Ref Range   aPTT 68 (H) 24 - 36 seconds    Comment:        IF BASELINE aPTT IS ELEVATED, SUGGEST PATIENT RISK ASSESSMENT BE USED TO DETERMINE APPROPRIATE ANTICOAGULANT THERAPY. Performed at [facility], [address]  [place], [place] [address]   I-Stat Lactic Acid, ED     Status: None   Collection Time: 11/13/22 12:00 AM  Result Value Ref Range   Lactic Acid, Venous 0.8 0.5 - 1.9 mmol/L  Comprehensive metabolic panel     Status: Abnormal   Collection Time: 11/13/22  1:55 AM  Result Value Ref Range   Sodium 133 (L) 135 - 145 mmol/L   Potassium 3.7 3.5 - 5.1 mmol/L   Chloride 98 98 - 111 mmol/L   CO2 24 22 - 32 mmol/L   Glucose, Bld 113 (H) 70 - 99 mg/dL    Comment: Glucose reference range applies only to samples taken after fasting for at least 8 hours.   BUN 12 8 - 23 mg/dL   Creatinine, Ser 4.69 (H) 0.61 - 1.24 mg/dL   Calcium 7.6 (L) 8.9 - 10.3 mg/dL   Total Protein 5.6 (L) 6.5 - 8.1 g/dL   Albumin 2.9 (L) 3.5 - 5.0 g/dL   AST 24 15 - 41 U/L   ALT 6 0 - 44 U/L   Alkaline Phosphatase 73 38 - 126 U/L   Total Bilirubin 0.7 0.3 - 1.2 mg/dL   GFR, Estimated 48 (L) >60 mL/min    Comment: (NOTE) Calculated using the CKD-EPI Creatinine Equation (2021)    Anion gap 11 5 - 15    Comment: Performed at [facility], [address], [place], [place] [address]  D-dimer, quantitative     Status: None   Collection Time: 11/13/22  1:55 AM  Result Value Ref Range   D-Dimer, Quant 0.31 0.00 - 0.50 ug/mL-FEU    Comment: (NOTE) At the manufacturer cut-off value of 0.5 g/mL FEU, this assay has a negative predictive value of 95-100%.This assay is intended for use in conjunction with a clinical pretest probability (PTP) assessment model to exclude pulmonary embolism (PE) and deep venous thrombosis (DVT) in outpatients suspected of PE or DVT. Results should be correlated with clinical presentation. Performed at [facility], [address] [place], [place] [address]   Lactate dehydrogenase     Status: None   Collection Time: 11/13/22  1:55 AM  Result Value Ref Range  LDH 176 98 - 192 U/L    Comment: Performed at [facility], [address] [place], [place] [address]  I-Stat Lactic Acid,  ED     Status: None   Collection Time: 11/13/22  2:01 AM  Result Value Ref Range   Lactic Acid, Venous 0.8 0.5 - 1.9 mmol/L  Brain natriuretic peptide     Status: Abnormal   Collection Time: 11/13/22  6:49 AM  Result Value Ref Range   B Natriuretic Peptide 444.6 (H) 0.0 - 100.0 pg/mL    Comment: Performed at [facility], [address] [place], [place] [address]  Comprehensive metabolic panel     Status: Abnormal   Collection Time: 11/13/22  6:49 AM  Result Value Ref Range   Sodium 134 (L) 135 - 145 mmol/L   Potassium 3.8 3.5 - 5.1 mmol/L   Chloride 98 98 - 111 mmol/L   CO2 23 22 - 32 mmol/L   Glucose, Bld 135 (H) 70 - 99 mg/dL    Comment: Glucose reference range applies only to samples taken after fasting for at least 8 hours.   BUN 12 8 - 23 mg/dL   Creatinine, Ser 0.98 (H) 0.61 - 1.24 mg/dL   Calcium 8.2 (L) 8.9 - 10.3 mg/dL   Total Protein 6.4 (L) 6.5 - 8.1 g/dL   Albumin 3.2 (L) 3.5 - 5.0 g/dL   AST 27 15 - 41 U/L   ALT 13 0 - 44 U/L   Alkaline Phosphatase 81 38 - 126 U/L   Total Bilirubin 0.6 0.3 - 1.2 mg/dL   GFR, Estimated 48 (L) >60 mL/min    Comment: (NOTE) Calculated using the CKD-EPI Creatinine Equation (2021)    Anion gap 13 5 - 15    Comment: Performed at [facility], [address] [place], [place] [address]  Magnesium     Status: Abnormal   Collection Time: 11/13/22  6:49 AM  Result Value Ref Range   Magnesium 1.6 (L) 1.7 - 2.4 mg/dL    Comment: Performed at [facility], [address] [place], [place] [address]  CBC with Differential/Platelet     Status: Abnormal   Collection Time: 11/13/22  6:49 AM  Result Value Ref Range   WBC 6.1 4.0 - 10.5 K/uL   RBC 3.75 (L) 4.22 - 5.81 MIL/uL   Hemoglobin 11.4 (L) 13.0 - 17.0 g/dL   HCT 62.1 (L) 30.8 - 65.7 %   MCV 94.4 80.0 - 100.0 fL   MCH 30.4 26.0 - 34.0 pg   MCHC 32.2 30.0 - 36.0 g/dL   RDW 84.6 96.2 - 95.2 %   Platelets 87 (L) 150 - 400 K/uL    Comment: Immature Platelet Fraction  may be clinically indicated, consider ordering this additional test [id number] REPEATED TO VERIFY    nRBC 0.0 0.0 - 0.2 %   Neutrophils Relative % 80 %   Neutro Abs 5.0 1.7 - 7.7 K/uL   Lymphocytes Relative 14 %   Lymphs Abs 0.8 0.7 - 4.0 K/uL   Monocytes Relative 5 %   Monocytes Absolute 0.3 0.1 - 1.0 K/uL   Eosinophils Relative 0 %   Eosinophils Absolute 0.0 0.0 - 0.5 K/uL   Basophils Relative 0 %   Basophils Absolute 0.0 0.0 - 0.1 K/uL   Immature Granulocytes 1 %   Abs Immature Granulocytes 0.03 0.00 - 0.07 K/uL    Comment: Performed at [facility], [address], [place], [place] [address]  C-reactive protein     Status: Abnormal   Collection Time: 11/13/22  6:49 AM  Result Value Ref Range   CRP 8.2 (H) <1.0 mg/dL    Comment: Performed at [facility], [address] [place], [place] [address]  Urinalysis, w/ Reflex to Culture (Infection Suspected) -Urine, Clean Catch     Status: Abnormal   Collection Time: 11/13/22  8:02 AM  Result Value Ref Range   Specimen Source URINE, CLEAN CATCH    Color, Urine YELLOW YELLOW   APPearance CLEAR CLEAR   Specific Gravity, Urine 1.024 1.005 - 1.030   pH 5.0 5.0 - 8.0   Glucose, UA >=500 (A) NEGATIVE mg/dL   Hgb urine dipstick NEGATIVE NEGATIVE   Bilirubin Urine NEGATIVE NEGATIVE   Ketones, ur NEGATIVE NEGATIVE mg/dL   Protein, ur NEGATIVE NEGATIVE mg/dL   Nitrite NEGATIVE NEGATIVE   Leukocytes,Ua NEGATIVE NEGATIVE   RBC / HPF 0-5 0 - 5 RBC/hpf   WBC, UA 0-5 0 - 5 WBC/hpf    Comment:        Reflex urine culture not performed if WBC <=10, OR if Squamous epithelial cells >5. If Squamous epithelial cells >5 suggest recollection.    Bacteria, UA NONE SEEN NONE SEEN   Squamous Epithelial / HPF 0-5 0 - 5 /HPF    Comment: Performed at [facility], [address], [place], [place] [address]   CT Angio Chest PE W and/or Wo Contrast  Result Date: 11/13/2022 CLINICAL DATA:  Pulmonary embolism suspected, high  probability. History of heart failure, GERD, atrial fibrillation on Eliquis, with 1 day history of shortness of breath, hypoxia, and decreased mental status. EXAM: CT ANGIOGRAPHY CHEST WITH CONTRAST TECHNIQUE: Multidetector CT imaging of the chest was performed using the standard protocol during bolus administration of intravenous contrast. Multiplanar CT image reconstructions and MIPs were obtained to evaluate the vascular anatomy. RADIATION DOSE REDUCTION: This exam was performed according to the departmental dose-optimization program which includes automated exposure control, adjustment of the mA and/or kV according to patient size and/or use of iterative reconstruction technique. CONTRAST:  75mL OMNIPAQUE IOHEXOL 350 MG/ML SOLN COMPARISON:  06/11/2022. FINDINGS: Cardiovascular: The heart is enlarged and there is a small pericardial effusion. Scattered coronary artery calcifications are noted. There is atherosclerotic calcification of the aorta without evidence of aneurysm. Pulmonary trunk is distended suggesting underlying pulmonary artery hypertension. No evidence of pulmonary embolism. Mediastinum/Nodes: Enlarged lymph nodes are present in the mediastinum measuring up to 1.5 cm in the subcarinal space. There is an enlarged right hilar lymph node measuring 1.2 cm. No axillary lymphadenopathy. The thyroid gland is within normal limits. Debris is noted in the proximal esophagus, compatible with history of gastroesophageal reflux disease. Lungs/Pleura: Debris is noted in the posterior trachea extending into the bronchi bilaterally, greater on the right than on the left. Bilateral bronchial wall thickening is present. Paraseptal and centrilobular emphysematous changes are noted in the lungs. Apical pleural and parenchymal scarring is present bilaterally. There is bilateral lower lobe consolidation. There are small bilateral pleural effusions. No pneumothorax is seen. A 9 mm nodule is noted in the left lower lobe,  axial image 99. Upper Abdomen: Small stones are noted in the gallbladder. No acute abnormality. Musculoskeletal: Cervical spinal fusion hardware is noted. Degenerative changes are present in the thoracic spine. No acute or suspicious osseous abnormality. Review of the MIP images confirms the above findings. IMPRESSION: 1. No evidence of pulmonary embolism. 2.  Debris in the trachea and the mainstem bronchi, greater on the right than on the left with patchy airspace disease in the lower lobes bilaterally, concerning for aspiration pneumonia. 3. Small bilateral pleural effusions. 4. Emphysema. 5. Debris in the proximal esophagus, compatible with history of GERD. 6. 9 mm left lower lobe pulmonary nodule. Consider one of the following in 3 months for both low-risk and high-risk individuals: (a) repeat chest CT, (b) follow-up PET-CT, or (c) tissue sampling. This recommendation follows the consensus statement: Guidelines for Management of Incidental Pulmonary Nodules Detected on CT Images: From the Fleischner Society 2017; Radiology 2017; 284:228-243. 7. Cardiomegaly with small pericardial effusion and coronary artery calcifications. 8. Distended pulmonary trunk suggesting underlying pulmonary artery hypertension. 9. Cholelithiasis. 10. Aortic atherosclerosis. Electronically Signed   By: Thornell Sartorius M.D.   On: 11/13/2022 03:27   DG Chest Port 1 View  Result Date: 11/12/2022 CLINICAL DATA:  Possible sepsis, respiratory distress, fever. Lethargy. EXAM: PORTABLE CHEST 1 VIEW COMPARISON:  07/08/2022. FINDINGS: The heart is enlarged and mediastinal contours are within normal limits. There is atherosclerotic calcification of the aorta. Hyperinflation of the lungs is noted. Mild airspace disease is noted at the lung bases bilaterally. No effusion or pneumothorax. There is partial visualization of cervical spinal fusion hardware. IMPRESSION: Minimal atelectasis or infiltrate at the lung bases. Electronically Signed   By:  Thornell Sartorius M.D.   On: 11/12/2022 23:39    Pending Labs Unresulted Labs (From admission, onward)     Start     Ordered   11/12/22 2313  Blood Culture (routine x 2)  (Septic presentation on arrival (screening labs, nursing and treatment orders for obvious sepsis))  BLOOD CULTURE X 2,   STAT,   Status:  Canceled      11/12/22 2313            Vitals/Pain Today's Vitals   11/13/22 0800 11/13/22 0834 11/13/22 1000 11/13/22 1030  BP: 123/73  124/79   Pulse: 74  80 88  Resp: 13  14 19   Temp:  98.2 F (36.8 C)    TempSrc:  Oral    SpO2: 94%  93% 95%  Weight:      Height:      PainSc:        Isolation Precautions Airborne and Contact precautions  Medications Medications  lactated ringers infusion ( Intravenous New Bag/Given 11/13/22 0801)  cefTRIAXone (ROCEPHIN) 2 g in sodium chloride 0.9 % 100 mL IVPB (0 g Intravenous Stopped 11/13/22 0215)  acetaminophen (TYLENOL) tablet 650 mg (has no administration in time range)    Or  acetaminophen (TYLENOL) suppository 650 mg (has no administration in time range)  senna-docusate (Senokot-S) tablet 1 tablet (has no administration in time range)  remdesivir 200 mg in sodium chloride 0.9% 250 mL IVPB (0 mg Intravenous Stopped 11/13/22 0414)    Followed by  remdesivir 100 mg in sodium chloride 0.9 % 100 mL IVPB (has no administration in time range)  dexamethasone (DECADRON) injection 6 mg (has no administration in time range)  ipratropium-albuterol (DUONEB) 0.5-2.5 (3) MG/3ML nebulizer solution 3 mL (has no administration in time range)  apixaban (ELIQUIS) tablet 5 mg (5 mg Oral Given 11/13/22 1011)  DULoxetine (CYMBALTA) DR capsule 60 mg (60 mg Oral Given 11/13/22 1011)  pantoprazole (PROTONIX) EC tablet 40 mg (40 mg Oral Given 11/13/22 1011)  metoprolol tartrate (LOPRESSOR) tablet 25 mg (has no administration in time range)  lactated ringers bolus 1,000 mL (0 mLs Intravenous Stopped 11/13/22 0309)  metroNIDAZOLE (FLAGYL) IVPB 500 mg (0 mg  Intravenous Stopped 11/13/22 0325)  dexamethasone (DECADRON) injection 10 mg (10 mg Intravenous Given 11/13/22 0217)  lactated ringers bolus 1,000 mL (0 mLs Intravenous Stopped 11/13/22 0325)  iohexol (OMNIPAQUE) 350 MG/ML injection 75 mL (75 mLs Intravenous Contrast Given 11/13/22 0309)    Mobility walks     Focused Assessments Pulmonary Assessment Handoff:  Lung sounds: Bilateral Breath Sounds: Diminished O2 Device: Nasal Cannula O2 Flow Rate (L/min): 3 L/min    R Recommendations: See Admitting Provider Note  Report given to:   Additional Notes:

## 2022-11-13 NOTE — Progress Notes (Signed)
Echocardiogram 2D Echocardiogram has been performed.  Dustin [name] 11/13/2022, 4:01 PM

## 2022-11-13 NOTE — Progress Notes (Signed)
PHARMACY - PHYSICIAN COMMUNICATION CRITICAL VALUE ALERT - BLOOD CULTURE IDENTIFICATION (BCID)  Dustin [name] is an 82 y.o. male who presented to [facility] on [facility]/12/2022 with a chief complaint of Covid  + pna  Assessment:  1/4 blood cx bottles with staph epi (+mecA) - likely contaminant (include suspected source if known)  Name of physician (or Provider) Contacted: Dr. Loney Loh  Current antibiotics: Rocephin  Changes to prescribed antibiotics recommended:  No additional abx required  Results for orders placed or performed during the hospital encounter of 11/12/22  Blood Culture ID Panel (Reflexed) (Collected: 11/12/2022 11:45 PM)  Result Value Ref Range   Enterococcus faecalis NOT DETECTED NOT DETECTED   Enterococcus Faecium NOT DETECTED NOT DETECTED   Listeria monocytogenes NOT DETECTED NOT DETECTED   Staphylococcus species DETECTED (A) NOT DETECTED   Staphylococcus aureus (BCID) NOT DETECTED NOT DETECTED   Staphylococcus epidermidis DETECTED (A) NOT DETECTED   Staphylococcus lugdunensis NOT DETECTED NOT DETECTED   Streptococcus species NOT DETECTED NOT DETECTED   Streptococcus agalactiae NOT DETECTED NOT DETECTED   Streptococcus pneumoniae NOT DETECTED NOT DETECTED   Streptococcus pyogenes NOT DETECTED NOT DETECTED   A.calcoaceticus-baumannii NOT DETECTED NOT DETECTED   Bacteroides fragilis NOT DETECTED NOT DETECTED   Enterobacterales NOT DETECTED NOT DETECTED   Enterobacter cloacae complex NOT DETECTED NOT DETECTED   Escherichia coli NOT DETECTED NOT DETECTED   Klebsiella aerogenes NOT DETECTED NOT DETECTED   Klebsiella oxytoca NOT DETECTED NOT DETECTED   Klebsiella pneumoniae NOT DETECTED NOT DETECTED   Proteus species NOT DETECTED NOT DETECTED   Salmonella species NOT DETECTED NOT DETECTED   Serratia marcescens NOT DETECTED NOT DETECTED   Haemophilus influenzae NOT DETECTED NOT DETECTED   Neisseria meningitidis NOT DETECTED NOT DETECTED   Pseudomonas aeruginosa NOT  DETECTED NOT DETECTED   Stenotrophomonas maltophilia NOT DETECTED NOT DETECTED   Candida albicans NOT DETECTED NOT DETECTED   Candida auris NOT DETECTED NOT DETECTED   Candida glabrata NOT DETECTED NOT DETECTED   Candida krusei NOT DETECTED NOT DETECTED   Candida parapsilosis NOT DETECTED NOT DETECTED   Candida tropicalis NOT DETECTED NOT DETECTED   Cryptococcus neoformans/gattii NOT DETECTED NOT DETECTED   Methicillin resistance mecA/C DETECTED (A) NOT DETECTED    Christoper Fabian, PharmD, BCPS Please see amion for complete clinical pharmacist phone list 11/13/2022  9:33 PM

## 2022-11-13 NOTE — ED Notes (Signed)
Assumed care of pt from April, RN. O2 titrated down to 3L [place]. VSS. Call bell within reach. Awaiting admit bed.

## 2022-11-13 NOTE — ED Notes (Signed)
Assisted pt to bedside commode. Able to move independently. Uses call bell appropriately.

## 2022-11-13 NOTE — ED Notes (Signed)
Patient transported to CT 

## 2022-11-13 NOTE — ED Notes (Addendum)
ED TO INPATIENT HANDOFF REPORT  ED Nurse Name and Phone #: Dahlia Client [phone]  S Name/Age/Gender Dustin [name] Comment 82 y.o. male Room/Bed: 028C/028C  Code Status   Code Status: Do not attempt resuscitation (DNR) - Comfort care  Home/SNF/Other Home Patient oriented to: self, place, time, and situation Is this baseline? Yes   Triage Complete: Triage complete  Chief Complaint Acute respiratory failure due to COVID-19 (HCC) [U07.1, J96.00]  Triage Note PT bib [facility] from home. Family called EMS with a concern of PT being lethargic.O2 was originally 85% and then dropped to 70%. PT is lethargic , a+0 x4 , no pain. Temp was 101.5 tympanic. CBG:130.   Allergies Allergies  Allergen Reactions   Desmopressin Acetate Other (See Comments)    Hyponatremia    Zoloft [Sertraline] Other (See Comments)    Felt bad - Patient denies    Level of Care/Admitting Diagnosis ED Disposition     ED Disposition  Admit   Condition  --   Comment  Hospital Area: [facility] [[id number]]  Level of Care: Progressive [102]  Admit to Progressive based on following criteria: RESPIRATORY PROBLEMS hypoxemic/hypercapnic respiratory failure that is responsive to NIPPV (BiPAP) or High Flow Nasal Cannula (6-80 lpm). Frequent assessment/intervention, no > Q2 hrs < Q4 hrs, to maintain oxygenation and pulmonary hygiene.  May admit patient to [facility] or [facility] if equivalent level of care is available:: Yes  Covid Evaluation: Confirmed COVID Positive  Diagnosis: Acute respiratory failure due to COVID-19 [facility]) [[id number]]  Admitting Physician: Gery Pray [[id number]]  Attending Physician: Gery Pray [phone]  Certification:: I certify this patient will need inpatient services for at least 2 midnights  Expected Medical Readiness: 11/15/2022          B Medical/Surgery History Past Medical History:  Diagnosis Date   BPH (benign prostatic hyperplasia)    Chronic combined systolic and diastolic  congestive heart failure (HCC)    GERD (gastroesophageal reflux disease)    protonix for control   Insomnia    Neck pain    Neuropathy    Past Surgical History:  Procedure Laterality Date   ANTERIOR CERVICAL DECOMPRESSION/DISCECTOMY FUSION 4 LEVELS N/A 10/27/2012   Procedure: Cervical Three-Four Cervical Four-Five Cervical Five-Six Cervical Six-Seven  Anterior cervical decompression/diskectomy/fusion;  Surgeon: Maeola Harman, MD;  Location: [facility] NEURO ORS;  Service: Neurosurgery;  Laterality: N/A;  Cervical Three-Four Cervical Four-Five Cervical Five-Six Cervical Six-Seven  Anterior cervical decompression/diskectomy/fusion   CARDIOVERSION N/A 09/20/2020   Procedure: CARDIOVERSION;  Surgeon: Little Ishikawa, MD;  Location: [facility] ENDOSCOPY;  Service: Cardiovascular;  Laterality: N/A;   FEMUR IM NAIL Right 03/08/2020   Procedure: INTRAMEDULLARY (IM) RETROGRADE FEMORAL NAILING;  Surgeon: Roby Lofts, MD;  Location: [facility] OR;  Service: Orthopedics;  Laterality: Right;   SHOULDER ARTHROSCOPY  2010   rt shoulder   SHOULDER ARTHROSCOPY  02/15/2011   Procedure: ARTHROSCOPY SHOULDER;  Surgeon: Erasmo Leventhal;  Location: [facility];  Service: Orthopedics;  Laterality: Right;  Debridement   TONSILLECTOMY       A IV Location/Drains/Wounds Patient Lines/Drains/Airways Status     Active Line/Drains/Airways     Name Placement date Placement time Site Days   Peripheral IV 06/11/22 20 G 1" Anterior;Right;Upper Arm 06/11/22  1211  Arm  155   Peripheral IV 11/12/22 20 G Left Antecubital 11/12/22  2000  Antecubital  1            Intake/Output Last 24 hours  Intake/Output Summary (Last 24  hours) at 11/13/2022 8119 Last data filed at 11/13/2022 0414 Gross per 24 hour  Intake 2742.75 ml  Output --  Net 2742.75 ml    Labs/Imaging Results for orders placed or performed during the hospital encounter of 11/12/22 (from the past 48 hour(s))  Resp panel by RT-PCR (RSV, Flu A&B,  Covid) Anterior Nasal Swab     Status: Abnormal   Collection Time: 11/12/22 11:13 PM   Specimen: Anterior Nasal Swab  Result Value Ref Range   SARS Coronavirus 2 by RT PCR POSITIVE (A) NEGATIVE   Influenza A by PCR NEGATIVE NEGATIVE   Influenza B by PCR NEGATIVE NEGATIVE    Comment: (NOTE) The Xpert Xpress SARS-CoV-2/FLU/RSV plus assay is intended as an aid in the diagnosis of influenza from Nasopharyngeal swab specimens and should not be used as a sole basis for treatment. Nasal washings and aspirates are unacceptable for Xpert Xpress SARS-CoV-2/FLU/RSV testing.  Fact Sheet for Patients: BloggerCourse.com  Fact Sheet for Healthcare Providers: SeriousBroker.it  This test is not yet approved or cleared by the [place] FDA and has been authorized for detection and/or diagnosis of SARS-CoV-2 by FDA under an Emergency Use Authorization (EUA). This EUA will remain in effect (meaning this test can be used) for the duration of the COVID-19 declaration under Section 564(b)(1) of the Act, 21 U.S.C. section 360bbb-3(b)(1), unless the authorization is terminated or revoked.     Resp Syncytial Virus by PCR NEGATIVE NEGATIVE    Comment: (NOTE) Fact Sheet for Patients: BloggerCourse.com  Fact Sheet for Healthcare Providers: SeriousBroker.it  This test is not yet approved or cleared by the [place] FDA and has been authorized for detection and/or diagnosis of SARS-CoV-2 by FDA under an Emergency Use Authorization (EUA). This EUA will remain in effect (meaning this test can be used) for the duration of the COVID-19 declaration under Section 564(b)(1) of the Act, 21 U.S.C. section 360bbb-3(b)(1), unless the authorization is terminated or revoked.  Performed at [facility], [address] [place], [place] [address]   I-Stat arterial blood gas, ED [facility], [facility], [facility])      Status: Abnormal   Collection Time: 11/12/22 11:28 PM  Result Value Ref Range   pH, Arterial 7.380 7.35 - 7.45   pCO2 arterial 44.9 32 - 48 mmHg   pO2, Arterial 57 (L) 83 - 108 mmHg   Bicarbonate 26.5 20.0 - 28.0 mmol/L   TCO2 28 22 - 32 mmol/L   O2 Saturation 88 %   Acid-Base Excess 1.0 0.0 - 2.0 mmol/L   Sodium 134 (L) 135 - 145 mmol/L   Potassium 3.5 3.5 - 5.1 mmol/L   Calcium, Ion 1.17 1.15 - 1.40 mmol/L   HCT 33.0 (L) 39.0 - 52.0 %   Hemoglobin 11.2 (L) 13.0 - 17.0 g/dL   Patient temperature 95.6 F    Collection site RADIAL, ALLEN'S TEST ACCEPTABLE    Drawn by RT    Sample type ARTERIAL   CBC with Differential     Status: Abnormal   Collection Time: 11/12/22 11:35 PM  Result Value Ref Range   WBC 6.2 4.0 - 10.5 K/uL   RBC 3.67 (L) 4.22 - 5.81 MIL/uL   Hemoglobin 11.2 (L) 13.0 - 17.0 g/dL   HCT 21.3 (L) 08.6 - 57.8 %   MCV 94.6 80.0 - 100.0 fL   MCH 30.5 26.0 - 34.0 pg   MCHC 32.3 30.0 - 36.0 g/dL   RDW 46.9 62.9 - 52.8 %   Platelets  84 (L) 150 - 400 K/uL    Comment: Immature Platelet Fraction may be clinically indicated, consider ordering this additional test [id number] REPEATED TO VERIFY    nRBC 0.0 0.0 - 0.2 %   Neutrophils Relative % 72 %   Neutro Abs 4.5 1.7 - 7.7 K/uL   Lymphocytes Relative 15 %   Lymphs Abs 0.9 0.7 - 4.0 K/uL   Monocytes Relative 12 %   Monocytes Absolute 0.7 0.1 - 1.0 K/uL   Eosinophils Relative 0 %   Eosinophils Absolute 0.0 0.0 - 0.5 K/uL   Basophils Relative 0 %   Basophils Absolute 0.0 0.0 - 0.1 K/uL   Immature Granulocytes 1 %   Abs Immature Granulocytes 0.03 0.00 - 0.07 K/uL    Comment: Performed at [facility], [address] [place], [place] [address]  Protime-INR     Status: Abnormal   Collection Time: 11/12/22 11:35 PM  Result Value Ref Range   Prothrombin Time 19.1 (H) 11.4 - 15.2 seconds   INR 1.6 (H) 0.8 - 1.2    Comment: (NOTE) INR goal varies based on device and disease states. Performed at [facility]  Lab, [address], [place], [place] [address]   APTT     Status: Abnormal   Collection Time: 11/12/22 11:35 PM  Result Value Ref Range   aPTT 68 (H) 24 - 36 seconds    Comment:        IF BASELINE aPTT IS ELEVATED, SUGGEST PATIENT RISK ASSESSMENT BE USED TO DETERMINE APPROPRIATE ANTICOAGULANT THERAPY. Performed at [facility], [address] [place], [place] [address]   I-Stat Lactic Acid, ED     Status: None   Collection Time: 11/13/22 12:00 AM  Result Value Ref Range   Lactic Acid, Venous 0.8 0.5 - 1.9 mmol/L  Comprehensive metabolic panel     Status: Abnormal   Collection Time: 11/13/22  1:55 AM  Result Value Ref Range   Sodium 133 (L) 135 - 145 mmol/L   Potassium 3.7 3.5 - 5.1 mmol/L   Chloride 98 98 - 111 mmol/L   CO2 24 22 - 32 mmol/L   Glucose, Bld 113 (H) 70 - 99 mg/dL    Comment: Glucose reference range applies only to samples taken after fasting for at least 8 hours.   BUN 12 8 - 23 mg/dL   Creatinine, Ser 8.46 (H) 0.61 - 1.24 mg/dL   Calcium 7.6 (L) 8.9 - 10.3 mg/dL   Total Protein 5.6 (L) 6.5 - 8.1 g/dL   Albumin 2.9 (L) 3.5 - 5.0 g/dL   AST 24 15 - 41 U/L   ALT 6 0 - 44 U/L   Alkaline Phosphatase 73 38 - 126 U/L   Total Bilirubin 0.7 0.3 - 1.2 mg/dL   GFR, Estimated 48 (L) >60 mL/min    Comment: (NOTE) Calculated using the CKD-EPI Creatinine Equation (2021)    Anion gap 11 5 - 15    Comment: Performed at [facility], [address] [place], [place] [address]  D-dimer, quantitative     Status: None   Collection Time: 11/13/22  1:55 AM  Result Value Ref Range   D-Dimer, Quant 0.31 0.00 - 0.50 ug/mL-FEU    Comment: (NOTE) At the manufacturer cut-off value of 0.5 g/mL FEU, this assay has a negative predictive value of 95-100%.This assay is intended for use in conjunction with a clinical pretest probability (PTP) assessment model to exclude pulmonary embolism (PE) and  deep venous thrombosis (DVT) in outpatients suspected of PE or DVT. Results  should be correlated with clinical presentation. Performed at [facility], [address], [place], [place] [address]   Lactate dehydrogenase     Status: None   Collection Time: 11/13/22  1:55 AM  Result Value Ref Range   LDH 176 98 - 192 U/L    Comment: Performed at [facility], [address], [place], [place] [address]  I-Stat Lactic Acid, ED     Status: None   Collection Time: 11/13/22  2:01 AM  Result Value Ref Range   Lactic Acid, Venous 0.8 0.5 - 1.9 mmol/L   CT Angio Chest PE W and/or Wo Contrast  Result Date: 11/13/2022 CLINICAL DATA:  Pulmonary embolism suspected, high probability. History of heart failure, GERD, atrial fibrillation on Eliquis, with 1 day history of shortness of breath, hypoxia, and decreased mental status. EXAM: CT ANGIOGRAPHY CHEST WITH CONTRAST TECHNIQUE: Multidetector CT imaging of the chest was performed using the standard protocol during bolus administration of intravenous contrast. Multiplanar CT image reconstructions and MIPs were obtained to evaluate the vascular anatomy. RADIATION DOSE REDUCTION: This exam was performed according to the departmental dose-optimization program which includes automated exposure control, adjustment of the mA and/or kV according to patient size and/or use of iterative reconstruction technique. CONTRAST:  75mL OMNIPAQUE IOHEXOL 350 MG/ML SOLN COMPARISON:  06/11/2022. FINDINGS: Cardiovascular: The heart is enlarged and there is a small pericardial effusion. Scattered coronary artery calcifications are noted. There is atherosclerotic calcification of the aorta without evidence of aneurysm. Pulmonary trunk is distended suggesting underlying pulmonary artery hypertension. No evidence of pulmonary embolism. Mediastinum/Nodes: Enlarged lymph nodes are present in the mediastinum measuring up to 1.5 cm in the subcarinal space. There is an enlarged right hilar lymph node measuring 1.2 cm. No axillary lymphadenopathy. The thyroid  gland is within normal limits. Debris is noted in the proximal esophagus, compatible with history of gastroesophageal reflux disease. Lungs/Pleura: Debris is noted in the posterior trachea extending into the bronchi bilaterally, greater on the right than on the left. Bilateral bronchial wall thickening is present. Paraseptal and centrilobular emphysematous changes are noted in the lungs. Apical pleural and parenchymal scarring is present bilaterally. There is bilateral lower lobe consolidation. There are small bilateral pleural effusions. No pneumothorax is seen. A 9 mm nodule is noted in the left lower lobe, axial image 99. Upper Abdomen: Small stones are noted in the gallbladder. No acute abnormality. Musculoskeletal: Cervical spinal fusion hardware is noted. Degenerative changes are present in the thoracic spine. No acute or suspicious osseous abnormality. Review of the MIP images confirms the above findings. IMPRESSION: 1. No evidence of pulmonary embolism. 2. Debris in the trachea and the mainstem bronchi, greater on the right than on the left with patchy airspace disease in the lower lobes bilaterally, concerning for aspiration pneumonia. 3. Small bilateral pleural effusions. 4. Emphysema. 5. Debris in the proximal esophagus, compatible with history of GERD. 6. 9 mm left lower lobe pulmonary nodule. Consider one of the following in 3 months for both low-risk and high-risk individuals: (a) repeat chest CT, (b) follow-up PET-CT, or (c) tissue sampling. This recommendation follows the consensus statement: Guidelines for Management of Incidental Pulmonary Nodules Detected on CT Images: From the Fleischner Society 2017; Radiology 2017; 284:228-243. 7. Cardiomegaly with small pericardial effusion and coronary artery calcifications. 8. Distended pulmonary trunk suggesting underlying pulmonary artery hypertension. 9. Cholelithiasis. 10. Aortic atherosclerosis. Electronically Signed   By: Vernona Rieger  Ladona Ridgel M.D.   On:  11/13/2022 03:27   DG Chest Port 1 View  Result Date: 11/12/2022 CLINICAL DATA:  Possible sepsis, respiratory distress, fever. Lethargy. EXAM: PORTABLE CHEST 1 VIEW COMPARISON:  07/08/2022. FINDINGS: The heart is enlarged and mediastinal contours are within normal limits. There is atherosclerotic calcification of the aorta. Hyperinflation of the lungs is noted. Mild airspace disease is noted at the lung bases bilaterally. No effusion or pneumothorax. There is partial visualization of cervical spinal fusion hardware. IMPRESSION: Minimal atelectasis or infiltrate at the lung bases. Electronically Signed   By: Thornell Sartorius M.D.   On: 11/12/2022 23:39    Pending Labs Unresulted Labs (From admission, onward)     Start     Ordered   11/13/22 0700  Comprehensive metabolic panel  Tomorrow morning,   R        11/13/22 0147   11/13/22 0700  Magnesium  Tomorrow morning,   R        11/13/22 0147   11/13/22 0700  CBC with Differential/Platelet  Tomorrow morning,   R        11/13/22 0147   11/13/22 0215  C-reactive protein  Once,   R        11/13/22 0215   11/12/22 2314  Brain natriuretic peptide  Once,   URGENT        11/12/22 2313   11/12/22 2313  Blood Culture (routine x 2)  (Septic presentation on arrival (screening labs, nursing and treatment orders for obvious sepsis))  BLOOD CULTURE X 2,   STAT,   Status:  Canceled      11/12/22 2313   11/12/22 2313  Urinalysis, w/ Reflex to Culture (Infection Suspected) -Urine, Clean Catch  (Septic presentation on arrival (screening labs, nursing and treatment orders for obvious sepsis))  ONCE - URGENT,   URGENT       Question:  Specimen Source  Answer:  Urine, Clean Catch   11/12/22 2313            Vitals/Pain Today's Vitals   11/13/22 0530 11/13/22 0545 11/13/22 0600 11/13/22 0615  BP: 114/66 (!) 89/49 (!) 94/47 (!) 109/59  Pulse: 66 73 79 79  Resp: 12 13 13 13   Temp:      TempSrc:      SpO2: 93% 94% 92% 93%  Weight:      Height:      PainSc:         Isolation Precautions Airborne and Contact precautions  Medications Medications  lactated ringers infusion ( Intravenous New Bag/Given 11/13/22 0007)  cefTRIAXone (ROCEPHIN) 2 g in sodium chloride 0.9 % 100 mL IVPB (0 g Intravenous Stopped 11/13/22 0215)  acetaminophen (TYLENOL) tablet 650 mg (has no administration in time range)    Or  acetaminophen (TYLENOL) suppository 650 mg (has no administration in time range)  senna-docusate (Senokot-S) tablet 1 tablet (has no administration in time range)  remdesivir 200 mg in sodium chloride 0.9% 250 mL IVPB (0 mg Intravenous Stopped 11/13/22 0414)    Followed by  remdesivir 100 mg in sodium chloride 0.9 % 100 mL IVPB (has no administration in time range)  dexamethasone (DECADRON) injection 6 mg (has no administration in time range)  ipratropium-albuterol (DUONEB) 0.5-2.5 (3) MG/3ML nebulizer solution 3 mL (has no administration in time range)  apixaban (ELIQUIS) tablet 5 mg (has no administration in time range)  DULoxetine (CYMBALTA) DR capsule 60 mg (has no administration in time range)  pantoprazole (PROTONIX) EC tablet 40 mg (  has no administration in time range)  lactated ringers bolus 1,000 mL (0 mLs Intravenous Stopped 11/13/22 0309)  metroNIDAZOLE (FLAGYL) IVPB 500 mg (0 mg Intravenous Stopped 11/13/22 0325)  dexamethasone (DECADRON) injection 10 mg (10 mg Intravenous Given 11/13/22 0217)  lactated ringers bolus 1,000 mL (0 mLs Intravenous Stopped 11/13/22 0325)  iohexol (OMNIPAQUE) 350 MG/ML injection 75 mL (75 mLs Intravenous Contrast Given 11/13/22 0309)    Mobility walks with device     Focused Assessments Cardiac Assessment Handoff:  Cardiac Rhythm: Atrial fibrillation Lab Results  Component Value Date   CKTOTAL 86 11/26/2017   TROPONINI <0.03 01/22/2016   Lab Results  Component Value Date   DDIMER 0.31 11/13/2022   Does the Patient currently have chest pain? No   , Pulmonary Assessment Handoff:  Lung sounds:   O2  Device: Nasal Cannula Flow Rate: 3L    R Recommendations: See Admitting Provider Note  Report given to:   Additional Notes: COV+

## 2022-11-13 NOTE — ED Notes (Signed)
Admitting provider at bedside.

## 2022-11-14 DIAGNOSIS — U071 COVID-19: Secondary | ICD-10-CM | POA: Diagnosis not present

## 2022-11-14 DIAGNOSIS — J9600 Acute respiratory failure, unspecified whether with hypoxia or hypercapnia: Secondary | ICD-10-CM | POA: Diagnosis not present

## 2022-11-14 LAB — CBC
HCT: 35 % — ABNORMAL LOW (ref 39.0–52.0)
Hemoglobin: 11.6 g/dL — ABNORMAL LOW (ref 13.0–17.0)
MCH: 30.7 pg (ref 26.0–34.0)
MCHC: 33.1 g/dL (ref 30.0–36.0)
MCV: 92.6 fL (ref 80.0–100.0)
Platelets: 94 10*3/uL — ABNORMAL LOW (ref 150–400)
RBC: 3.78 MIL/uL — ABNORMAL LOW (ref 4.22–5.81)
RDW: 13.2 % (ref 11.5–15.5)
WBC: 4.9 10*3/uL (ref 4.0–10.5)
nRBC: 0 % (ref 0.0–0.2)

## 2022-11-14 LAB — MAGNESIUM: Magnesium: 1.8 mg/dL (ref 1.7–2.4)

## 2022-11-14 LAB — CULTURE, BLOOD (ROUTINE X 2): Special Requests: ADEQUATE

## 2022-11-14 MED ORDER — MELATONIN 5 MG PO TABS
5.0000 mg | ORAL_TABLET | Freq: Every day | ORAL | Status: DC
  Administered 2022-11-14: 5 mg via ORAL
  Filled 2022-11-14: qty 1

## 2022-11-14 MED ORDER — GABAPENTIN 300 MG PO CAPS
600.0000 mg | ORAL_CAPSULE | Freq: Three times a day (TID) | ORAL | Status: DC
  Administered 2022-11-14 (×4): 1200 mg via ORAL
  Filled 2022-11-14 (×4): qty 4

## 2022-11-14 MED ORDER — OXYCODONE HCL ER 10 MG PO T12A
20.0000 mg | EXTENDED_RELEASE_TABLET | Freq: Two times a day (BID) | ORAL | Status: DC
  Administered 2022-11-14 – 2022-11-15 (×3): 20 mg via ORAL
  Filled 2022-11-14 (×3): qty 2

## 2022-11-14 MED ORDER — GABAPENTIN 300 MG PO CAPS: 600.0000 mg | ORAL_CAPSULE | Freq: Three times a day (TID) | ORAL | Status: DC

## 2022-11-14 NOTE — Plan of Care (Signed)

## 2022-11-14 NOTE — Progress Notes (Signed)
PROGRESS NOTE    Dustin [name]  ZOX:[id number] DOB: [birth year] DOA: 11/12/2022 PCP: Darrow Bussing, MD   Brief Narrative: Dustin [name] is a 82 y.o. male with a history of COPD, combined systolic and diastolic heart failure, persistent atrial fibrillation, depression.  Patient presented secondary to hypoxia with oxygen down to 77% on room air as an outpatient with associated respiratory distress.  Patient was found to be COVID-positive with associated pneumonia concerning for aspiration.  Patient required nonrebreather on presentation and has been weaned to nasal cannula.   Assessment/Plan:  Acute respiratory failure with hypoxia Secondary to pneumonia.  Patient requiring nonrebreather on presentation.  Reported oxygen with SpO2 down to 77% on room air with associated respiratory distress. -Wean to room air as able -Ambulatory pulse ox prior to discharge  COVID-19 infection Presumed possible COVID-19 pneumonia.  Patient started on Decadron IV and remdesivir on admission.  CRP elevated at 8.2.  D-dimer of 0.31 on admission. -Continue Decadron and remdesivir -Airborne/contact precautions x 10 days from date of diagnosis  Aspiration pneumonia CT imaging is suggestive of aspiration pneumonia secondary to evidence of debris noted in the trachea.  Patient started empirically on ceftriaxone on admission for treatment.  Speech therapy consulted with recommendation for regular diet. Will defer esophagram. -Continue ceftriaxone  Acute hypoxic encephalopathy Secondary to above.  Encephalopathy could also be related to medication use as patient is on high-dose Seroquel in addition to OxyContin, gabapentin as an outpatient.  Appears to have resolved.  Pulmonary artery hypertension Noted on Transthoracic Echocardiogram. Associated mildly reduced RV function. Will recommend outpatient cardiology follow-up.  Pulmonary nodule Located in the left lower lobe, measuring 6.9 mm.  Recommendation  for follow-up CT scanning of the chest in 3 months.  Systolic heart dysfunction Mild reduction of LV with EF of 50-55%. No evidence of overload.  Small pericardial effusion Noted on CT imaging.  Not seen on Transthoracic Echocardiogram.  COPD Stable.  No evidence of exacerbation. -Continue DuoNeb as needed  Persistent atrial fibrillation Rate controlled.  Patient is on Eliquis and metoprolol as an outpatient.  Metoprolol held on admission. -Resume metoprolol and continue Eliquis  Depression -Continue Cymbalta  CKD stage IV Stable.   DVT prophylaxis: Eliquis Code Status:   Code Status: Do not attempt resuscitation (DNR) - Comfort care Family Communication: None at bedside Disposition Plan: Discharge likely in 1 to 2 days pending completion of remdesivir outpatient antibiotic regimen, and ability to wean oxygen to room air as able   Consultants:  None  Procedures:  Transthoracic Echocardiogram  Antimicrobials: Ceftriaxone Remdesivir   Subjective: Did not sleep well overnight. No other concerns.  Objective: BP (!) 162/85 (BP Location: Right Arm)   Pulse 83   Temp 97.6 F (36.4 C) (Oral)   Resp 18   Ht 6\' 2"  (1.88 m)   Wt 102.5 kg   SpO2 98%   BMI 29.01 kg/m   Examination:  General exam: Appears calm and comfortable Respiratory system: Clear to auscultation. Respiratory effort normal. Cardiovascular system: S1 & S2 heard, RRR. Gastrointestinal system: Abdomen is nondistended, soft and nontender.  Normal bowel sounds heard. Central nervous system: Alert and oriented. Musculoskeletal: No edema. No calf tenderness Psychiatry: Judgement and insight appear normal. Mood & affect appropriate.    Data Reviewed: I have personally reviewed following labs and imaging studies   Last CBC Lab Results  Component Value Date   WBC 4.9 11/14/2022   HGB 11.6 (L) 11/14/2022   HCT 35.0 (L) 11/14/2022  MCV 92.6 11/14/2022   MCH 30.7 11/14/2022   RDW 13.2 11/14/2022    PLT 94 (L) 11/14/2022     Last metabolic panel Lab Results  Component Value Date   GLUCOSE 135 (H) 11/13/2022   NA 134 (L) 11/13/2022   K 3.8 11/13/2022   CL 98 11/13/2022   CO2 23 11/13/2022   BUN 12 11/13/2022   CREATININE 1.46 (H) 11/13/2022   GFRNONAA 48 (L) 11/13/2022   CALCIUM 8.2 (L) 11/13/2022   PROT 6.4 (L) 11/13/2022   ALBUMIN 3.2 (L) 11/13/2022   LABGLOB 2.1 01/30/2015   AGRATIO 2.0 01/30/2015   BILITOT 0.6 11/13/2022   ALKPHOS 81 11/13/2022   AST 27 11/13/2022   ALT 13 11/13/2022   ANIONGAP 13 11/13/2022     Creatinine Clearance: Estimated Creatinine Clearance: 50.7 mL/min (A) (by C-G formula based on SCr of 1.46 mg/dL (H)).  Recent Results (from the past 240 hour(s))  Resp panel by RT-PCR (RSV, Flu A&B, Covid) Anterior Nasal Swab     Status: Abnormal   Collection Time: 11/12/22 11:13 PM   Specimen: Anterior Nasal Swab  Result Value Ref Range Status   SARS Coronavirus 2 by RT PCR POSITIVE (A) NEGATIVE Final   Influenza A by PCR NEGATIVE NEGATIVE Final   Influenza B by PCR NEGATIVE NEGATIVE Final    Comment: (NOTE) The Xpert Xpress SARS-CoV-2/FLU/RSV plus assay is intended as an aid in the diagnosis of influenza from Nasopharyngeal swab specimens and should not be used as a sole basis for treatment. Nasal washings and aspirates are unacceptable for Xpert Xpress SARS-CoV-2/FLU/RSV testing.  Fact Sheet for Patients: BloggerCourse.com  Fact Sheet for Healthcare Providers: SeriousBroker.it  This test is not yet approved or cleared by the [place] FDA and has been authorized for detection and/or diagnosis of SARS-CoV-2 by FDA under an Emergency Use Authorization (EUA). This EUA will remain in effect (meaning this test can be used) for the duration of the COVID-19 declaration under Section 564(b)(1) of the Act, 21 U.S.C. section 360bbb-3(b)(1), unless the authorization is terminated or revoked.      Resp Syncytial Virus by PCR NEGATIVE NEGATIVE Final    Comment: (NOTE) Fact Sheet for Patients: BloggerCourse.com  Fact Sheet for Healthcare Providers: SeriousBroker.it  This test is not yet approved or cleared by the [place] FDA and has been authorized for detection and/or diagnosis of SARS-CoV-2 by FDA under an Emergency Use Authorization (EUA). This EUA will remain in effect (meaning this test can be used) for the duration of the COVID-19 declaration under Section 564(b)(1) of the Act, 21 U.S.C. section 360bbb-3(b)(1), unless the authorization is terminated or revoked.  Performed at [facility], [address], [place], [place] [address]   Blood Culture (routine x 2)     Status: None (Preliminary result)   Collection Time: 11/12/22 11:45 PM   Specimen: BLOOD  Result Value Ref Range Status   Specimen Description BLOOD LEFT ANTECUBITAL  Final   Special Requests   Final    BOTTLES DRAWN AEROBIC AND ANAEROBIC Blood Culture adequate volume   Culture  Setup Time   Final    GRAM POSITIVE COCCI IN CLUSTERS BOTTLES DRAWN AEROBIC ONLY CRITICAL RESULT CALLED TO, READ BACK BY AND VERIFIED WITH: Wynn Banker [id number] @2035  FH Performed at [facility], [address], [place], [place] [address]    Culture [facility] POSITIVE COCCI  Final   Report Status PENDING  Incomplete  Blood Culture ID Panel (Reflexed)  Status: Abnormal   Collection Time: 11/12/22 11:45 PM  Result Value Ref Range Status   Enterococcus faecalis NOT DETECTED NOT DETECTED Final   Enterococcus Faecium NOT DETECTED NOT DETECTED Final   Listeria monocytogenes NOT DETECTED NOT DETECTED Final   Staphylococcus species DETECTED (A) NOT DETECTED Final    Comment: CRITICAL RESULT CALLED TO, READ BACK BY AND VERIFIED WITH: PHARMD K. AHMEND [id number] @2035  FH    Staphylococcus aureus (BCID) NOT DETECTED NOT DETECTED Final   Staphylococcus epidermidis  DETECTED (A) NOT DETECTED Final    Comment: Methicillin (oxacillin) resistant coagulase negative staphylococcus. Possible blood culture contaminant (unless isolated from more than one blood culture draw or clinical case suggests pathogenicity). No antibiotic treatment is indicated for blood  culture contaminants. CRITICAL RESULT CALLED TO, READ BACK BY AND VERIFIED WITH: PHARMD K. AHMEND [id number] @2035  FH    Staphylococcus lugdunensis NOT DETECTED NOT DETECTED Final   Streptococcus species NOT DETECTED NOT DETECTED Final   Streptococcus agalactiae NOT DETECTED NOT DETECTED Final   Streptococcus pneumoniae NOT DETECTED NOT DETECTED Final   Streptococcus pyogenes NOT DETECTED NOT DETECTED Final   A.calcoaceticus-baumannii NOT DETECTED NOT DETECTED Final   Bacteroides fragilis NOT DETECTED NOT DETECTED Final   Enterobacterales NOT DETECTED NOT DETECTED Final   Enterobacter cloacae complex NOT DETECTED NOT DETECTED Final   Escherichia coli NOT DETECTED NOT DETECTED Final   Klebsiella aerogenes NOT DETECTED NOT DETECTED Final   Klebsiella oxytoca NOT DETECTED NOT DETECTED Final   Klebsiella pneumoniae NOT DETECTED NOT DETECTED Final   Proteus species NOT DETECTED NOT DETECTED Final   Salmonella species NOT DETECTED NOT DETECTED Final   Serratia marcescens NOT DETECTED NOT DETECTED Final   Haemophilus influenzae NOT DETECTED NOT DETECTED Final   Neisseria meningitidis NOT DETECTED NOT DETECTED Final   Pseudomonas aeruginosa NOT DETECTED NOT DETECTED Final   Stenotrophomonas maltophilia NOT DETECTED NOT DETECTED Final   Candida albicans NOT DETECTED NOT DETECTED Final   Candida auris NOT DETECTED NOT DETECTED Final   Candida glabrata NOT DETECTED NOT DETECTED Final   Candida krusei NOT DETECTED NOT DETECTED Final   Candida parapsilosis NOT DETECTED NOT DETECTED Final   Candida tropicalis NOT DETECTED NOT DETECTED Final   Cryptococcus neoformans/gattii NOT DETECTED NOT DETECTED Final    Methicillin resistance mecA/C DETECTED (A) NOT DETECTED Final    Comment: CRITICAL RESULT CALLED TO, READ BACK BY AND VERIFIED WITH: Wynn Banker [id number] @2035  FH Performed at [facility], [address], [place], [place] [address]       Radiology Studies: ECHOCARDIOGRAM COMPLETE  Result Date: 11/13/2022    ECHOCARDIOGRAM REPORT   Patient Name:   HARVIN MCCULLY Date of Exam: 11/13/2022 Medical Rec #:  [id number]        Height:       74.0 in Accession #:    [id number]       Weight:       226.0 lb Date of Birth:  05/21/1940        BSA:          2.289 m Patient Age:    82 years         BP:           124/79 mmHg Patient Gender: M                HR:           84 bpm. Exam Location:  Inpatient Procedure: 2D Echo, Cardiac Doppler, Color  Doppler and Intracardiac            Opacification Agent Indications:    Pericardial effusion I31.3  History:        Patient has prior history of Echocardiogram examinations, most                 recent 09/14/2020. CHF, COPD, Arrythmias:Atrial Fibrillation;                 Signs/Symptoms:Syncope.  Sonographer:    Lucendia Herrlich RCS Referring Phys: [id number] DEBBY CROSLEY  Sonographer Comments: Image acquisition challenging due to patient body habitus. IMPRESSIONS  1. Left ventricular ejection fraction, by estimation, is 50 to 55%. The left ventricle has low normal function. The left ventricle has no regional wall motion abnormalities. Left ventricular diastolic parameters were normal.  2. Right ventricular systolic function is mildly reduced. The right ventricular size is normal. There is moderately elevated pulmonary artery systolic pressure. The estimated right ventricular systolic pressure is 46.9 mmHg.  3. Left atrial size was mildly dilated.  4. Right atrial size was mild to moderately dilated.  5. The mitral valve is normal in structure. Trivial mitral valve regurgitation. No evidence of mitral stenosis.  6. The aortic valve is normal in structure. Aortic valve  regurgitation is trivial. No aortic stenosis is present.  7. The aortic root measurement of 4cm is normal when indexed for BSA.  8. The inferior vena cava is dilated in size with >50% respiratory variability, suggesting right atrial pressure of 8 mmHg. FINDINGS  Left Ventricle: Left ventricular ejection fraction, by estimation, is 50 to 55%. The left ventricle has low normal function. The left ventricle has no regional wall motion abnormalities. Definity contrast agent was given IV to delineate the left ventricular endocardial borders. The left ventricular internal cavity size was normal in size. There is no left ventricular hypertrophy. Left ventricular diastolic parameters were normal. Normal left ventricular filling pressure. Right Ventricle: The right ventricular size is normal. No increase in right ventricular wall thickness. Right ventricular systolic function is mildly reduced. There is moderately elevated pulmonary artery systolic pressure. The tricuspid regurgitant velocity is 3.12 m/s, and with an assumed right atrial pressure of 8 mmHg, the estimated right ventricular systolic pressure is 46.9 mmHg. Left Atrium: Left atrial size was mildly dilated. Right Atrium: Right atrial size was mild to moderately dilated. Pericardium: There is no evidence of pericardial effusion. Mitral Valve: The mitral valve is normal in structure. Trivial mitral valve regurgitation. No evidence of mitral valve stenosis. Tricuspid Valve: The tricuspid valve is normal in structure. Tricuspid valve regurgitation is mild . No evidence of tricuspid stenosis. Aortic Valve: The aortic valve is normal in structure. Aortic valve regurgitation is trivial. No aortic stenosis is present. Aortic valve peak gradient measures 3.3 mmHg. Pulmonic Valve: The pulmonic valve was normal in structure. Pulmonic valve regurgitation is not visualized. No evidence of pulmonic stenosis. Aorta: The aortic root measurement of 4cm is normal when indexed for  BSA. The aortic root is normal in size and structure. Venous: The inferior vena cava is dilated in size with greater than 50% respiratory variability, suggesting right atrial pressure of 8 mmHg. IAS/Shunts: No atrial level shunt detected by color flow Doppler.  LEFT VENTRICLE PLAX 2D LVIDd:         4.90 cm   Diastology LVIDs:         3.90 cm   LV e' medial:    8.24 cm/s LV PW:  1.20 cm   LV E/e' medial:  10.3 LV IVS:        1.00 cm   LV e' lateral:   13.20 cm/s LVOT diam:     2.40 cm   LV E/e' lateral: 6.4 LV SV:         54 LV SV Index:   24 LVOT Area:     4.52 cm  RIGHT VENTRICLE             IVC RV S prime:     12.70 cm/s  IVC diam: 3.10 cm TAPSE (M-mode): 1.0 cm LEFT ATRIUM             Index        RIGHT ATRIUM           Index LA diam:        4.50 cm 1.97 cm/m   RA Area:     26.10 cm LA Vol (A2C):   93.0 ml 40.63 ml/m  RA Volume:   83.00 ml  36.26 ml/m LA Vol (A4C):   68.0 ml 29.70 ml/m LA Biplane Vol: 80.1 ml 34.99 ml/m  AORTIC VALVE AV Area (Vmax): 3.15 cm AV Vmax:        91.10 cm/s AV Peak Grad:   3.3 mmHg LVOT Vmax:      63.40 cm/s LVOT Vmean:     40.300 cm/s LVOT VTI:       0.120 m  AORTA Ao Root diam: 4.00 cm Ao Asc diam:  3.20 cm MITRAL VALVE               TRICUSPID VALVE MV Area (PHT): 5.54 cm    TR Peak grad:   38.9 mmHg MV Decel Time: 137 msec    TR Vmax:        312.00 cm/s MV E velocity: 84.80 cm/s MV A velocity: 30.20 cm/s  SHUNTS MV E/A ratio:  2.81        Systemic VTI:  0.12 m                            Systemic Diam: 2.40 cm Armanda Magic MD Electronically signed by Armanda Magic MD Signature Date/Time: 11/13/2022/4:32:56 PM    Final    CT Angio Chest PE W and/or Wo Contrast  Result Date: 11/13/2022 CLINICAL DATA:  Pulmonary embolism suspected, high probability. History of heart failure, GERD, atrial fibrillation on Eliquis, with 1 day history of shortness of breath, hypoxia, and decreased mental status. EXAM: CT ANGIOGRAPHY CHEST WITH CONTRAST TECHNIQUE: Multidetector CT imaging of  the chest was performed using the standard protocol during bolus administration of intravenous contrast. Multiplanar CT image reconstructions and MIPs were obtained to evaluate the vascular anatomy. RADIATION DOSE REDUCTION: This exam was performed according to the departmental dose-optimization program which includes automated exposure control, adjustment of the mA and/or kV according to patient size and/or use of iterative reconstruction technique. CONTRAST:  75mL OMNIPAQUE IOHEXOL 350 MG/ML SOLN COMPARISON:  06/11/2022. FINDINGS: Cardiovascular: The heart is enlarged and there is a small pericardial effusion. Scattered coronary artery calcifications are noted. There is atherosclerotic calcification of the aorta without evidence of aneurysm. Pulmonary trunk is distended suggesting underlying pulmonary artery hypertension. No evidence of pulmonary embolism. Mediastinum/Nodes: Enlarged lymph nodes are present in the mediastinum measuring up to 1.5 cm in the subcarinal space. There is an enlarged right hilar lymph node measuring 1.2 cm. No axillary lymphadenopathy. The thyroid gland is within  normal limits. Debris is noted in the proximal esophagus, compatible with history of gastroesophageal reflux disease. Lungs/Pleura: Debris is noted in the posterior trachea extending into the bronchi bilaterally, greater on the right than on the left. Bilateral bronchial wall thickening is present. Paraseptal and centrilobular emphysematous changes are noted in the lungs. Apical pleural and parenchymal scarring is present bilaterally. There is bilateral lower lobe consolidation. There are small bilateral pleural effusions. No pneumothorax is seen. A 9 mm nodule is noted in the left lower lobe, axial image 99. Upper Abdomen: Small stones are noted in the gallbladder. No acute abnormality. Musculoskeletal: Cervical spinal fusion hardware is noted. Degenerative changes are present in the thoracic spine. No acute or suspicious  osseous abnormality. Review of the MIP images confirms the above findings. IMPRESSION: 1. No evidence of pulmonary embolism. 2. Debris in the trachea and the mainstem bronchi, greater on the right than on the left with patchy airspace disease in the lower lobes bilaterally, concerning for aspiration pneumonia. 3. Small bilateral pleural effusions. 4. Emphysema. 5. Debris in the proximal esophagus, compatible with history of GERD. 6. 9 mm left lower lobe pulmonary nodule. Consider one of the following in 3 months for both low-risk and high-risk individuals: (a) repeat chest CT, (b) follow-up PET-CT, or (c) tissue sampling. This recommendation follows the consensus statement: Guidelines for Management of Incidental Pulmonary Nodules Detected on CT Images: From the Fleischner Society 2017; Radiology 2017; 284:228-243. 7. Cardiomegaly with small pericardial effusion and coronary artery calcifications. 8. Distended pulmonary trunk suggesting underlying pulmonary artery hypertension. 9. Cholelithiasis. 10. Aortic atherosclerosis. Electronically Signed   By: Thornell Sartorius M.D.   On: 11/13/2022 03:27   DG Chest Port 1 View  Result Date: 11/12/2022 CLINICAL DATA:  Possible sepsis, respiratory distress, fever. Lethargy. EXAM: PORTABLE CHEST 1 VIEW COMPARISON:  07/08/2022. FINDINGS: The heart is enlarged and mediastinal contours are within normal limits. There is atherosclerotic calcification of the aorta. Hyperinflation of the lungs is noted. Mild airspace disease is noted at the lung bases bilaterally. No effusion or pneumothorax. There is partial visualization of cervical spinal fusion hardware. IMPRESSION: Minimal atelectasis or infiltrate at the lung bases. Electronically Signed   By: Thornell Sartorius M.D.   On: 11/12/2022 23:39      LOS: 1 day    Jacquelin Hawking, MD [facility] 11/14/2022, 8:45 AM   If 7PM-7AM, please contact night-coverage [email or web address]

## 2022-11-15 DIAGNOSIS — J9600 Acute respiratory failure, unspecified whether with hypoxia or hypercapnia: Secondary | ICD-10-CM | POA: Diagnosis not present

## 2022-11-15 DIAGNOSIS — U071 COVID-19: Secondary | ICD-10-CM | POA: Diagnosis not present

## 2022-11-15 MED ORDER — GABAPENTIN 300 MG PO CAPS
600.0000 mg | ORAL_CAPSULE | ORAL | Status: DC
  Administered 2022-11-15: 600 mg via ORAL
  Filled 2022-11-15: qty 2

## 2022-11-15 MED ORDER — GABAPENTIN 300 MG PO CAPS: 300.0000 mg | ORAL_CAPSULE | Freq: Three times a day (TID) | ORAL | Status: DC

## 2022-11-15 MED ORDER — GABAPENTIN 400 MG PO CAPS: 1200.0000 mg | ORAL_CAPSULE | Freq: Every day | ORAL | Status: DC

## 2022-11-15 MED ORDER — PREDNISONE 20 MG PO TABS: 40.0000 mg | ORAL_TABLET | Freq: Every day | ORAL | 0 refills | Status: AC

## 2022-11-15 MED ORDER — AMOXICILLIN-POT CLAVULANATE 875-125 MG PO TABS: 1.0000 | ORAL_TABLET | Freq: Two times a day (BID) | ORAL | 0 refills | Status: AC

## 2022-11-15 NOTE — Discharge Instructions (Signed)
Carolynn Comment,  You were in the hospital with a COVID-19 infection and associated confusion. Your symptoms have improved. Your gabapentin was decreased based on your renal (kidney) function. Please follow-up with your doctor about this change; too much gabapentin can cause severe confusion issues.

## 2022-11-15 NOTE — Discharge Summary (Incomplete)
mouth 2 (two) times daily for 2 days.   apixaban 5 MG Tabs tablet Commonly  known as: ELIQUIS Take 5 mg by mouth 2 (two) times daily.   DULoxetine 60 MG capsule Commonly known as: CYMBALTA Take 60 mg by mouth daily.   Farxiga 10 MG Tabs tablet Generic drug: dapagliflozin propanediol Take 10 mg by mouth daily.   ferrous sulfate 325 (65 FE) MG tablet Take 1 tablet (325 mg total) by mouth daily with breakfast.   gabapentin 300 MG capsule Commonly known as: NEURONTIN Take 1 capsule (300 mg total) by mouth 3 (three) times daily. What changed:  how much to take additional instructions   ibuprofen 200 MG tablet Commonly known as: ADVIL Take 400 mg by mouth as needed for moderate pain.   metoprolol tartrate 25 MG tablet Commonly known as: LOPRESSOR Take 25 mg by mouth 2 (two) times daily.   multivitamin with minerals Tabs tablet Take 1 tablet by mouth daily.   OxyCONTIN 20 MG 12 hr tablet Generic drug: oxyCODONE Take 20 mg by mouth 2 (two) times daily.   pantoprazole 40 MG tablet Commonly known as: PROTONIX Take 1 tablet (40 mg total) by mouth daily.   predniSONE 20 MG tablet Commonly known as: DELTASONE Take 2 tablets (40 mg total) by mouth daily with breakfast for 3 days.   QUEtiapine 300 MG tablet Commonly known as: SEROQUEL Take 600 mg by mouth at bedtime.   silodosin 8 MG Caps capsule Commonly known as: RAPAFLO Take 8 mg by mouth daily.   Vitamin D (Ergocalciferol) 1.25 MG (50000 UNIT) Caps capsule Commonly known as: DRISDOL Take 50,000 Units by mouth once a week. Thursdays        Follow-up Information     Koirala, Dibas, MD. Schedule an appointment as soon as possible for a visit in 1 week(s).   Specialty: Family Medicine Why: For hospital follow-up Contact information: [address] [place] [address] [phone]                Discharge Exam: BP 115/71 (BP Location: Right Arm)   Pulse 80   Temp 97.6 F (36.4 C) (Oral)   Resp 18   Ht 6\' 2"  (1.88 m)   Wt 102.5 kg   SpO2 96%   BMI 29.01  kg/m   General exam: Appears calm and comfortable   Condition at discharge: stable  The results of significant diagnostics from this hospitalization (including imaging, microbiology, ancillary and laboratory) are listed below for reference.   Imaging Studies: ECHOCARDIOGRAM COMPLETE  Result Date: 11/13/2022    ECHOCARDIOGRAM REPORT   Patient Name:   Dustin [name] Date of Exam: 11/13/2022 Medical Rec #:  [id number]        Height:       74.0 in Accession #:    [id number]       Weight:       226.0 lb Date of Birth:  November 06, 1940        BSA:          2.289 m Patient Age:    82 years         BP:           124/79 mmHg Patient Gender: M                HR:           84 bpm. Exam Location:  Inpatient Procedure: 2D Echo, Cardiac Doppler, Color Doppler and Intracardiac  mouth 2 (two) times daily for 2 days.   apixaban 5 MG Tabs tablet Commonly  known as: ELIQUIS Take 5 mg by mouth 2 (two) times daily.   DULoxetine 60 MG capsule Commonly known as: CYMBALTA Take 60 mg by mouth daily.   Farxiga 10 MG Tabs tablet Generic drug: dapagliflozin propanediol Take 10 mg by mouth daily.   ferrous sulfate 325 (65 FE) MG tablet Take 1 tablet (325 mg total) by mouth daily with breakfast.   gabapentin 300 MG capsule Commonly known as: NEURONTIN Take 1 capsule (300 mg total) by mouth 3 (three) times daily. What changed:  how much to take additional instructions   ibuprofen 200 MG tablet Commonly known as: ADVIL Take 400 mg by mouth as needed for moderate pain.   metoprolol tartrate 25 MG tablet Commonly known as: LOPRESSOR Take 25 mg by mouth 2 (two) times daily.   multivitamin with minerals Tabs tablet Take 1 tablet by mouth daily.   OxyCONTIN 20 MG 12 hr tablet Generic drug: oxyCODONE Take 20 mg by mouth 2 (two) times daily.   pantoprazole 40 MG tablet Commonly known as: PROTONIX Take 1 tablet (40 mg total) by mouth daily.   predniSONE 20 MG tablet Commonly known as: DELTASONE Take 2 tablets (40 mg total) by mouth daily with breakfast for 3 days.   QUEtiapine 300 MG tablet Commonly known as: SEROQUEL Take 600 mg by mouth at bedtime.   silodosin 8 MG Caps capsule Commonly known as: RAPAFLO Take 8 mg by mouth daily.   Vitamin D (Ergocalciferol) 1.25 MG (50000 UNIT) Caps capsule Commonly known as: DRISDOL Take 50,000 Units by mouth once a week. Thursdays        Follow-up Information     Koirala, Dibas, MD. Schedule an appointment as soon as possible for a visit in 1 week(s).   Specialty: Family Medicine Why: For hospital follow-up Contact information: [address] [place] [address] [phone]                Discharge Exam: BP 115/71 (BP Location: Right Arm)   Pulse 80   Temp 97.6 F (36.4 C) (Oral)   Resp 18   Ht 6\' 2"  (1.88 m)   Wt 102.5 kg   SpO2 96%   BMI 29.01  kg/m   General exam: Appears calm and comfortable   Condition at discharge: stable  The results of significant diagnostics from this hospitalization (including imaging, microbiology, ancillary and laboratory) are listed below for reference.   Imaging Studies: ECHOCARDIOGRAM COMPLETE  Result Date: 11/13/2022    ECHOCARDIOGRAM REPORT   Patient Name:   Dustin [name] Date of Exam: 11/13/2022 Medical Rec #:  [id number]        Height:       74.0 in Accession #:    [id number]       Weight:       226.0 lb Date of Birth:  November 06, 1940        BSA:          2.289 m Patient Age:    82 years         BP:           124/79 mmHg Patient Gender: M                HR:           84 bpm. Exam Location:  Inpatient Procedure: 2D Echo, Cardiac Doppler, Color Doppler and Intracardiac  mouth 2 (two) times daily for 2 days.   apixaban 5 MG Tabs tablet Commonly  known as: ELIQUIS Take 5 mg by mouth 2 (two) times daily.   DULoxetine 60 MG capsule Commonly known as: CYMBALTA Take 60 mg by mouth daily.   Farxiga 10 MG Tabs tablet Generic drug: dapagliflozin propanediol Take 10 mg by mouth daily.   ferrous sulfate 325 (65 FE) MG tablet Take 1 tablet (325 mg total) by mouth daily with breakfast.   gabapentin 300 MG capsule Commonly known as: NEURONTIN Take 1 capsule (300 mg total) by mouth 3 (three) times daily. What changed:  how much to take additional instructions   ibuprofen 200 MG tablet Commonly known as: ADVIL Take 400 mg by mouth as needed for moderate pain.   metoprolol tartrate 25 MG tablet Commonly known as: LOPRESSOR Take 25 mg by mouth 2 (two) times daily.   multivitamin with minerals Tabs tablet Take 1 tablet by mouth daily.   OxyCONTIN 20 MG 12 hr tablet Generic drug: oxyCODONE Take 20 mg by mouth 2 (two) times daily.   pantoprazole 40 MG tablet Commonly known as: PROTONIX Take 1 tablet (40 mg total) by mouth daily.   predniSONE 20 MG tablet Commonly known as: DELTASONE Take 2 tablets (40 mg total) by mouth daily with breakfast for 3 days.   QUEtiapine 300 MG tablet Commonly known as: SEROQUEL Take 600 mg by mouth at bedtime.   silodosin 8 MG Caps capsule Commonly known as: RAPAFLO Take 8 mg by mouth daily.   Vitamin D (Ergocalciferol) 1.25 MG (50000 UNIT) Caps capsule Commonly known as: DRISDOL Take 50,000 Units by mouth once a week. Thursdays        Follow-up Information     Koirala, Dibas, MD. Schedule an appointment as soon as possible for a visit in 1 week(s).   Specialty: Family Medicine Why: For hospital follow-up Contact information: [address] [place] [address] [phone]                Discharge Exam: BP 115/71 (BP Location: Right Arm)   Pulse 80   Temp 97.6 F (36.4 C) (Oral)   Resp 18   Ht 6\' 2"  (1.88 m)   Wt 102.5 kg   SpO2 96%   BMI 29.01  kg/m   General exam: Appears calm and comfortable   Condition at discharge: stable  The results of significant diagnostics from this hospitalization (including imaging, microbiology, ancillary and laboratory) are listed below for reference.   Imaging Studies: ECHOCARDIOGRAM COMPLETE  Result Date: 11/13/2022    ECHOCARDIOGRAM REPORT   Patient Name:   Dustin [name] Date of Exam: 11/13/2022 Medical Rec #:  [id number]        Height:       74.0 in Accession #:    [id number]       Weight:       226.0 lb Date of Birth:  November 06, 1940        BSA:          2.289 m Patient Age:    82 years         BP:           124/79 mmHg Patient Gender: M                HR:           84 bpm. Exam Location:  Inpatient Procedure: 2D Echo, Cardiac Doppler, Color Doppler and Intracardiac  mouth 2 (two) times daily for 2 days.   apixaban 5 MG Tabs tablet Commonly  known as: ELIQUIS Take 5 mg by mouth 2 (two) times daily.   DULoxetine 60 MG capsule Commonly known as: CYMBALTA Take 60 mg by mouth daily.   Farxiga 10 MG Tabs tablet Generic drug: dapagliflozin propanediol Take 10 mg by mouth daily.   ferrous sulfate 325 (65 FE) MG tablet Take 1 tablet (325 mg total) by mouth daily with breakfast.   gabapentin 300 MG capsule Commonly known as: NEURONTIN Take 1 capsule (300 mg total) by mouth 3 (three) times daily. What changed:  how much to take additional instructions   ibuprofen 200 MG tablet Commonly known as: ADVIL Take 400 mg by mouth as needed for moderate pain.   metoprolol tartrate 25 MG tablet Commonly known as: LOPRESSOR Take 25 mg by mouth 2 (two) times daily.   multivitamin with minerals Tabs tablet Take 1 tablet by mouth daily.   OxyCONTIN 20 MG 12 hr tablet Generic drug: oxyCODONE Take 20 mg by mouth 2 (two) times daily.   pantoprazole 40 MG tablet Commonly known as: PROTONIX Take 1 tablet (40 mg total) by mouth daily.   predniSONE 20 MG tablet Commonly known as: DELTASONE Take 2 tablets (40 mg total) by mouth daily with breakfast for 3 days.   QUEtiapine 300 MG tablet Commonly known as: SEROQUEL Take 600 mg by mouth at bedtime.   silodosin 8 MG Caps capsule Commonly known as: RAPAFLO Take 8 mg by mouth daily.   Vitamin D (Ergocalciferol) 1.25 MG (50000 UNIT) Caps capsule Commonly known as: DRISDOL Take 50,000 Units by mouth once a week. Thursdays        Follow-up Information     Koirala, Dibas, MD. Schedule an appointment as soon as possible for a visit in 1 week(s).   Specialty: Family Medicine Why: For hospital follow-up Contact information: [address] [place] [address] [phone]                Discharge Exam: BP 115/71 (BP Location: Right Arm)   Pulse 80   Temp 97.6 F (36.4 C) (Oral)   Resp 18   Ht 6\' 2"  (1.88 m)   Wt 102.5 kg   SpO2 96%   BMI 29.01  kg/m   General exam: Appears calm and comfortable   Condition at discharge: stable  The results of significant diagnostics from this hospitalization (including imaging, microbiology, ancillary and laboratory) are listed below for reference.   Imaging Studies: ECHOCARDIOGRAM COMPLETE  Result Date: 11/13/2022    ECHOCARDIOGRAM REPORT   Patient Name:   Dustin [name] Date of Exam: 11/13/2022 Medical Rec #:  [id number]        Height:       74.0 in Accession #:    [id number]       Weight:       226.0 lb Date of Birth:  November 06, 1940        BSA:          2.289 m Patient Age:    82 years         BP:           124/79 mmHg Patient Gender: M                HR:           84 bpm. Exam Location:  Inpatient Procedure: 2D Echo, Cardiac Doppler, Color Doppler and Intracardiac  Physician Discharge Summary   Patient: Dustin [name] MRN: [id number] DOB: March 06, 1940  Admit date:     11/12/2022  Discharge date: 11/15/22  Discharge Physician: Jacquelin Hawking, MD   PCP: Darrow Bussing, MD   Recommendations at discharge:  PCP follow-up  Discharge Diagnoses: Principal Problem:   Acute respiratory failure due to COVID-19 [facility]) Active Problems:   Acute respiratory failure with hypoxia (HCC)   Persistent atrial fibrillation (HCC)   Depression   Gait instability   Chronic combined systolic (congestive) and diastolic (congestive) heart failure (HCC)   Acute metabolic encephalopathy   COPD (chronic obstructive pulmonary disease) (HCC)  Resolved Problems:   * No resolved hospital problems. *  Hospital Course: SEANN GENTHER is a 82 y.o. male with a history of COPD, combined systolic and diastolic heart failure, persistent atrial fibrillation, depression.  Patient presented secondary to hypoxia with oxygen down to 77% on room air as an outpatient with associated respiratory distress.  Patient was found to be COVID-positive with associated pneumonia concerning for aspiration.  Patient required nonrebreather on presentation and has been weaned to room air. Patient transitioned from Ceftriaxone to Augmentin on discharge. Patient seen by speech therapy with recommendation for regular diet with thin liquid.  Assessment and Plan:  Acute respiratory failure with hypoxia Secondary to pneumonia.  Patient requiring nonrebreather on presentation.  Reported oxygen with SpO2 down to 77% on room air with associated respiratory distress. Patient weaned successfully to room air without the requirement for oxygen with ambulation.   COVID-19 infection Presumed possible COVID-19 pneumonia.  Patient started on Decadron IV and remdesivir on admission.  CRP elevated at 8.2.  D-dimer of 0.31 on admission. Patient completed 3 days of remdesivir. Patient discharged on prednisone to complete  short course of steroids.   Aspiration pneumonia CT imaging is suggestive of aspiration pneumonia secondary to evidence of debris noted in the trachea.  Patient started empirically on ceftriaxone on admission for treatment.  Speech therapy consulted with recommendation for regular diet. Will defer esophagram. Patient transitioned to Augmentin on discharge.   Acute hypoxic encephalopathy Secondary to above.  Encephalopathy could also be related to medication use as patient is on high-dose Seroquel in addition to OxyContin, gabapentin as an outpatient, however this is unclear.  Appears to have resolved.   Pulmonary artery hypertension Noted on Transthoracic Echocardiogram. Associated mildly reduced RV function. Will recommend outpatient cardiology follow-up.   Pulmonary nodule Located in the left lower lobe, measuring 6.9 mm.  Recommendation for follow-up CT scanning of the chest in 3 months.   Systolic heart dysfunction Mild reduction of LV with EF of 50-55%. No evidence of overload.   Small pericardial effusion Noted on CT imaging.  Not seen on Transthoracic Echocardiogram.   COPD Stable.  No evidence of exacerbation.   Persistent atrial fibrillation Rate controlled.  Patient is on Eliquis and metoprolol as an outpatient.  Metoprolol held on admission. Continue on discharge.   Depression Continue Cymbalta   CKD stage IV Stable.   Consultants: None Procedures performed: Transthoracic Echocardiogram  Disposition: Home Diet recommendation: Regular diet   DISCHARGE MEDICATION: Allergies as of 11/15/2022       Reactions   Desmopressin Acetate Other (See Comments)   Hyponatremia    Zoloft [sertraline] Other (See Comments)   Felt bad - Patient denies        Medication List     TAKE these medications    amoxicillin-clavulanate 875-125 MG tablet Commonly known as: AUGMENTIN Take 1 tablet by  mouth 2 (two) times daily for 2 days.   apixaban 5 MG Tabs tablet Commonly  known as: ELIQUIS Take 5 mg by mouth 2 (two) times daily.   DULoxetine 60 MG capsule Commonly known as: CYMBALTA Take 60 mg by mouth daily.   Farxiga 10 MG Tabs tablet Generic drug: dapagliflozin propanediol Take 10 mg by mouth daily.   ferrous sulfate 325 (65 FE) MG tablet Take 1 tablet (325 mg total) by mouth daily with breakfast.   gabapentin 300 MG capsule Commonly known as: NEURONTIN Take 1 capsule (300 mg total) by mouth 3 (three) times daily. What changed:  how much to take additional instructions   ibuprofen 200 MG tablet Commonly known as: ADVIL Take 400 mg by mouth as needed for moderate pain.   metoprolol tartrate 25 MG tablet Commonly known as: LOPRESSOR Take 25 mg by mouth 2 (two) times daily.   multivitamin with minerals Tabs tablet Take 1 tablet by mouth daily.   OxyCONTIN 20 MG 12 hr tablet Generic drug: oxyCODONE Take 20 mg by mouth 2 (two) times daily.   pantoprazole 40 MG tablet Commonly known as: PROTONIX Take 1 tablet (40 mg total) by mouth daily.   predniSONE 20 MG tablet Commonly known as: DELTASONE Take 2 tablets (40 mg total) by mouth daily with breakfast for 3 days.   QUEtiapine 300 MG tablet Commonly known as: SEROQUEL Take 600 mg by mouth at bedtime.   silodosin 8 MG Caps capsule Commonly known as: RAPAFLO Take 8 mg by mouth daily.   Vitamin D (Ergocalciferol) 1.25 MG (50000 UNIT) Caps capsule Commonly known as: DRISDOL Take 50,000 Units by mouth once a week. Thursdays        Follow-up Information     Koirala, Dibas, MD. Schedule an appointment as soon as possible for a visit in 1 week(s).   Specialty: Family Medicine Why: For hospital follow-up Contact information: [address] [place] [address] [phone]                Discharge Exam: BP 115/71 (BP Location: Right Arm)   Pulse 80   Temp 97.6 F (36.4 C) (Oral)   Resp 18   Ht 6\' 2"  (1.88 m)   Wt 102.5 kg   SpO2 96%   BMI 29.01  kg/m   General exam: Appears calm and comfortable   Condition at discharge: stable  The results of significant diagnostics from this hospitalization (including imaging, microbiology, ancillary and laboratory) are listed below for reference.   Imaging Studies: ECHOCARDIOGRAM COMPLETE  Result Date: 11/13/2022    ECHOCARDIOGRAM REPORT   Patient Name:   Dustin [name] Date of Exam: 11/13/2022 Medical Rec #:  [id number]        Height:       74.0 in Accession #:    [id number]       Weight:       226.0 lb Date of Birth:  November 06, 1940        BSA:          2.289 m Patient Age:    82 years         BP:           124/79 mmHg Patient Gender: M                HR:           84 bpm. Exam Location:  Inpatient Procedure: 2D Echo, Cardiac Doppler, Color Doppler and Intracardiac  mouth 2 (two) times daily for 2 days.   apixaban 5 MG Tabs tablet Commonly  known as: ELIQUIS Take 5 mg by mouth 2 (two) times daily.   DULoxetine 60 MG capsule Commonly known as: CYMBALTA Take 60 mg by mouth daily.   Farxiga 10 MG Tabs tablet Generic drug: dapagliflozin propanediol Take 10 mg by mouth daily.   ferrous sulfate 325 (65 FE) MG tablet Take 1 tablet (325 mg total) by mouth daily with breakfast.   gabapentin 300 MG capsule Commonly known as: NEURONTIN Take 1 capsule (300 mg total) by mouth 3 (three) times daily. What changed:  how much to take additional instructions   ibuprofen 200 MG tablet Commonly known as: ADVIL Take 400 mg by mouth as needed for moderate pain.   metoprolol tartrate 25 MG tablet Commonly known as: LOPRESSOR Take 25 mg by mouth 2 (two) times daily.   multivitamin with minerals Tabs tablet Take 1 tablet by mouth daily.   OxyCONTIN 20 MG 12 hr tablet Generic drug: oxyCODONE Take 20 mg by mouth 2 (two) times daily.   pantoprazole 40 MG tablet Commonly known as: PROTONIX Take 1 tablet (40 mg total) by mouth daily.   predniSONE 20 MG tablet Commonly known as: DELTASONE Take 2 tablets (40 mg total) by mouth daily with breakfast for 3 days.   QUEtiapine 300 MG tablet Commonly known as: SEROQUEL Take 600 mg by mouth at bedtime.   silodosin 8 MG Caps capsule Commonly known as: RAPAFLO Take 8 mg by mouth daily.   Vitamin D (Ergocalciferol) 1.25 MG (50000 UNIT) Caps capsule Commonly known as: DRISDOL Take 50,000 Units by mouth once a week. Thursdays        Follow-up Information     Koirala, Dibas, MD. Schedule an appointment as soon as possible for a visit in 1 week(s).   Specialty: Family Medicine Why: For hospital follow-up Contact information: [address] [place] [address] [phone]                Discharge Exam: BP 115/71 (BP Location: Right Arm)   Pulse 80   Temp 97.6 F (36.4 C) (Oral)   Resp 18   Ht 6\' 2"  (1.88 m)   Wt 102.5 kg   SpO2 96%   BMI 29.01  kg/m   General exam: Appears calm and comfortable   Condition at discharge: stable  The results of significant diagnostics from this hospitalization (including imaging, microbiology, ancillary and laboratory) are listed below for reference.   Imaging Studies: ECHOCARDIOGRAM COMPLETE  Result Date: 11/13/2022    ECHOCARDIOGRAM REPORT   Patient Name:   Dustin [name] Date of Exam: 11/13/2022 Medical Rec #:  [id number]        Height:       74.0 in Accession #:    [id number]       Weight:       226.0 lb Date of Birth:  November 06, 1940        BSA:          2.289 m Patient Age:    82 years         BP:           124/79 mmHg Patient Gender: M                HR:           84 bpm. Exam Location:  Inpatient Procedure: 2D Echo, Cardiac Doppler, Color Doppler and Intracardiac

## 2022-11-15 NOTE — Progress Notes (Signed)
Pt ambulated in room on room air, maintaining oxygen levels at 94%. Pt reported no SOB, weakness, or dizziness. Pt anxiously awaiting to be discharged home. Notified Dr. Caleb Popp.

## 2022-11-16 NOTE — Hospital Course (Signed)
Dustin [name] is a 82 y.o. male with a history of COPD, combined systolic and diastolic heart failure, persistent atrial fibrillation, depression.  Patient presented secondary to hypoxia with oxygen down to 77% on room air as an outpatient with associated respiratory distress.  Patient was found to be COVID-positive with associated pneumonia concerning for aspiration.  Patient required nonrebreather on presentation and has been weaned to room air. Patient transitioned from Ceftriaxone to Augmentin on discharge. Patient seen by speech therapy with recommendation for regular diet with thin liquid.

## 2022-11-18 LAB — CULTURE, BLOOD (ROUTINE X 2): Culture: NO GROWTH

## 2022-12-23 ENCOUNTER — Encounter: Payer: Self-pay | Admitting: Cardiovascular Disease

## 2022-12-23 ENCOUNTER — Ambulatory Visit: Payer: Medicare Other | Attending: Cardiovascular Disease | Admitting: Cardiovascular Disease

## 2022-12-23 VITALS — BP 102/64 | HR 75 | Ht 74.0 in | Wt 215.0 lb

## 2022-12-23 DIAGNOSIS — I5042 Chronic combined systolic (congestive) and diastolic (congestive) heart failure: Secondary | ICD-10-CM

## 2022-12-23 DIAGNOSIS — I4819 Other persistent atrial fibrillation: Secondary | ICD-10-CM

## 2022-12-23 NOTE — Assessment & Plan Note (Signed)
History of persistent A-fib with EKG in September that showed atrial fibrillation.  He has had cardioversion in the past successfully sinus rhythm although he quickly reverted back to A-fib with controlled ventricular response.  He is on Eliquis oral anticoagulation.  He is unaware that he is in A-fib.

## 2022-12-23 NOTE — Assessment & Plan Note (Signed)
Recent 2D echo performed 11/13/2022 actually showed low normal systolic function with an EF of 50 to 55% with normal diastolic parameters.  He had no valvular abnormalities.  He has no symptoms of heart failure.

## 2022-12-23 NOTE — Progress Notes (Signed)
12/23/2022 [name]   24-Mar-1940  [id number]  Primary Physician Darrow Bussing, MD Primary Cardiologist: Runell Gess MD Nicholes Calamity, [place]  HPI:  Dustin [name] is a 82 y.o.    mildly overweight widowed Caucasian male father of 1, grandfather of 2 grandchildren referred by Dr.Koirala, his PCP, for newly recognized A. fib.  I last saw him in the office 12/10/2021.  He is accompanied by his daughter-in-law [name] today.  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  Following that, he moved to the [place] area and managed rental property for 30 years.  He is currently completely retired and does woodworking in his backyard.  He has no cardiac risk factors.  There is no family history.  He is never had a heart attack or stroke.  He denies chest pain or shortness of breath.  He is currently the caregiver for his wife.  Does have peripheral neuropathy.  He was found to be in A. fib with RVR by his PCP and begun on Eliquis oral anticoagulation .   He did have a failed DC cardioversion by Dr. Bjorn Pippin  and quickly reverted back to atrial fibrillation with controlled ventricular response.  Unfortunately, his wife of 57 years passed away in 04-21-21.Marland Kitchen  He currently lives alone and is independent.  He is asymptomatic specifically denying chest pain or shortness of breath.  He was hospitalized in September for COVID-pneumonia which she has recovered from.   Current Meds  Medication Sig   apixaban (ELIQUIS) 5 MG TABS tablet Take 5 mg by mouth 2 (two) times daily.   DULoxetine (CYMBALTA) 60 MG capsule Take 60 mg by mouth daily.   FARXIGA 10 MG TABS tablet Take 10 mg by mouth daily.   ferrous sulfate 325 (65 FE) MG tablet Take 1 tablet (325 mg total) by mouth daily with breakfast.   gabapentin (NEURONTIN) 300 MG capsule Take 1 capsule (300 mg total) by mouth 3 (three) times daily. (Patient taking differently: Take 600 mg by mouth 3 (three) times daily.)    ibuprofen (ADVIL) 200 MG tablet Take 400 mg by mouth as needed for moderate pain.   metoprolol tartrate (LOPRESSOR) 25 MG tablet Take 25 mg by mouth 2 (two) times daily.   Multiple Vitamin (MULTIVITAMIN WITH MINERALS) TABS tablet Take 1 tablet by mouth daily.   pantoprazole (PROTONIX) 40 MG tablet Take 1 tablet (40 mg total) by mouth daily.   QUEtiapine (SEROQUEL) 300 MG tablet Take 600 mg by mouth at bedtime.   silodosin (RAPAFLO) 8 MG CAPS capsule Take 8 mg by mouth daily.   Vitamin D, Ergocalciferol, (DRISDOL) 1.25 MG (50000 UNIT) CAPS capsule Take 50,000 Units by mouth once a week. Thursdays     Allergies  Allergen Reactions   Desmopressin Acetate Other (See Comments)    Hyponatremia    Zoloft [Sertraline] Other (See Comments)    Felt bad - Patient denies    Social History   Socioeconomic History   Marital status: Married    Spouse name: Annice Pih   Number of children: 2   Years of education: college   Highest education level: Not on file  Occupational History   Occupation: retired  Tobacco Use   Smoking status: Former    Current packs/day: 0.00    Types: Cigarettes    Quit date: 01/27/1990    Years since quitting: 32.9   Smokeless tobacco: Never  Vaping Use   Vaping  status: Never Used  Substance and Sexual Activity   Alcohol use: No   Drug use: No   Sexual activity: Not on file  Other Topics Concern   Not on file  Social History Narrative   Pt lives home with wife Annice Pih)   Pt is retired   Civil Service fast streamer    Pt is left handed   Pt consumes 2 cups of coffee daily   Social Determinants of Corporate investment banker Strain: Not on file  Food Insecurity: No Food Insecurity (11/13/2022)   Hunger Vital Sign    Worried About Running Out of Food in the Last Year: Never true    Ran Out of Food in the Last Year: Never true  Transportation Needs: No Transportation Needs (11/13/2022)   PRAPARE - Administrator, Civil Service (Medical): No    Lack of  Transportation (Non-Medical): No  Physical Activity: Not on file  Stress: Not on file  Social Connections: Not on file  Intimate Partner Violence: Not At Risk (11/13/2022)   Humiliation, Afraid, Rape, and Kick questionnaire    Fear of Current or Ex-Partner: No    Emotionally Abused: No    Physically Abused: No    Sexually Abused: No     Review of Systems: General: negative for chills, fever, night sweats or weight changes.  Cardiovascular: negative for chest pain, dyspnea on exertion, edema, orthopnea, palpitations, paroxysmal nocturnal dyspnea or shortness of breath Dermatological: negative for rash Respiratory: negative for cough or wheezing Urologic: negative for hematuria Abdominal: negative for nausea, vomiting, diarrhea, bright red blood per rectum, melena, or hematemesis Neurologic: negative for visual changes, syncope, or dizziness All other systems reviewed and are otherwise negative except as noted above.    Blood pressure 102/64, pulse 75, height 6\' 2"  (1.88 m), weight 215 lb (97.5 kg).  General appearance: alert and no distress Neck: no adenopathy, no carotid bruit, no JVD, supple, symmetrical, trachea midline, and thyroid not enlarged, symmetric, no tenderness/mass/nodules Lungs: clear to auscultation bilaterally Heart: irregularly irregular rhythm Extremities: extremities normal, atraumatic, no cyanosis or edema Pulses: 2+ and symmetric Skin: Skin color, texture, turgor normal. No rashes or lesions Neurologic: Grossly normal  EKG not performed today      ASSESSMENT AND PLAN:   Chronic combined systolic (congestive) and diastolic (congestive) heart failure (HCC) Recent 2D echo performed 11/13/2022 actually showed low normal systolic function with an EF of 50 to 55% with normal diastolic parameters.  He had no valvular abnormalities.  He has no symptoms of heart failure.  Persistent atrial fibrillation (HCC) History of persistent A-fib with EKG in September that  showed atrial fibrillation.  He has had cardioversion in the past successfully sinus rhythm although he quickly reverted back to A-fib with controlled ventricular response.  He is on Eliquis oral anticoagulation.  He is unaware that he is in A-fib.     Runell Gess MD FACP,FACC,FAHA, [facility] 12/23/2022 9:17 AM

## 2022-12-23 NOTE — Patient Instructions (Signed)
  Follow-Up: At [facility], you and your health needs are our priority.  As part of our continuing mission to provide you with exceptional heart care, we have created designated Provider Care Teams.  These Care Teams include your primary Cardiologist (physician) and Advanced Practice Providers (APPs -  Physician Assistants and Nurse Practitioners) who all work together to provide you with the care you need, when you need it.  We recommend signing up for the patient portal called "MyChart".  Sign up information is provided on this After Visit Summary.  MyChart is used to connect with patients for Virtual Visits (Telemedicine).  Patients are able to view lab/test results, encounter notes, upcoming appointments, etc.  Non-urgent messages can be sent to your provider as well.   To learn more about what you can do with MyChart, go to [email or web address].    Your next appointment:   6 month(s)  Provider:   Any APP , then return to see Dr Nanetta Batty MD in 12 months.

## 2023-03-30 ENCOUNTER — Inpatient Hospital Stay (HOSPITAL_COMMUNITY)
Admission: EM | Admit: 2023-03-30 | Discharge: 2023-04-08 | DRG: 480 | Disposition: A | Discharge status: Skilled Nursing Facility | Payer: Medicare Other | Attending: Internal Medicine | Admitting: Internal Medicine

## 2023-03-30 ENCOUNTER — Emergency Department (HOSPITAL_COMMUNITY): Payer: Medicare Other

## 2023-03-30 ENCOUNTER — Other Ambulatory Visit: Payer: Self-pay

## 2023-03-30 ENCOUNTER — Encounter (HOSPITAL_COMMUNITY): Payer: Self-pay

## 2023-03-30 DIAGNOSIS — M9701XA Periprosthetic fracture around internal prosthetic right hip joint, initial encounter: Secondary | ICD-10-CM | POA: Diagnosis present

## 2023-03-30 DIAGNOSIS — R571 Hypovolemic shock: Secondary | ICD-10-CM | POA: Diagnosis not present

## 2023-03-30 DIAGNOSIS — D696 Thrombocytopenia, unspecified: Secondary | ICD-10-CM

## 2023-03-30 DIAGNOSIS — S72001A Fracture of unspecified part of neck of right femur, initial encounter for closed fracture: Secondary | ICD-10-CM | POA: Diagnosis not present

## 2023-03-30 DIAGNOSIS — I5042 Chronic combined systolic (congestive) and diastolic (congestive) heart failure: Secondary | ICD-10-CM | POA: Diagnosis present

## 2023-03-30 DIAGNOSIS — N179 Acute kidney failure, unspecified: Secondary | ICD-10-CM | POA: Diagnosis not present

## 2023-03-30 DIAGNOSIS — D689 Coagulation defect, unspecified: Secondary | ICD-10-CM | POA: Diagnosis present

## 2023-03-30 DIAGNOSIS — R32 Unspecified urinary incontinence: Secondary | ICD-10-CM | POA: Diagnosis not present

## 2023-03-30 DIAGNOSIS — G629 Polyneuropathy, unspecified: Secondary | ICD-10-CM | POA: Diagnosis present

## 2023-03-30 DIAGNOSIS — F419 Anxiety disorder, unspecified: Secondary | ICD-10-CM | POA: Diagnosis present

## 2023-03-30 DIAGNOSIS — K219 Gastro-esophageal reflux disease without esophagitis: Secondary | ICD-10-CM | POA: Diagnosis present

## 2023-03-30 DIAGNOSIS — Z66 Do not resuscitate: Secondary | ICD-10-CM | POA: Diagnosis present

## 2023-03-30 DIAGNOSIS — Z7984 Long term (current) use of oral hypoglycemic drugs: Secondary | ICD-10-CM | POA: Diagnosis not present

## 2023-03-30 DIAGNOSIS — Z87891 Personal history of nicotine dependence: Secondary | ICD-10-CM

## 2023-03-30 DIAGNOSIS — D62 Acute posthemorrhagic anemia: Secondary | ICD-10-CM | POA: Diagnosis not present

## 2023-03-30 DIAGNOSIS — R578 Other shock: Secondary | ICD-10-CM | POA: Diagnosis not present

## 2023-03-30 DIAGNOSIS — I4891 Unspecified atrial fibrillation: Secondary | ICD-10-CM | POA: Diagnosis not present

## 2023-03-30 DIAGNOSIS — F32A Depression, unspecified: Secondary | ICD-10-CM | POA: Diagnosis present

## 2023-03-30 DIAGNOSIS — S72301D Unspecified fracture of shaft of right femur, subsequent encounter for closed fracture with routine healing: Secondary | ICD-10-CM | POA: Diagnosis not present

## 2023-03-30 DIAGNOSIS — N1832 Chronic kidney disease, stage 3b: Secondary | ICD-10-CM | POA: Diagnosis present

## 2023-03-30 DIAGNOSIS — N400 Enlarged prostate without lower urinary tract symptoms: Secondary | ICD-10-CM | POA: Diagnosis present

## 2023-03-30 DIAGNOSIS — R52 Pain, unspecified: Secondary | ICD-10-CM

## 2023-03-30 DIAGNOSIS — J9601 Acute respiratory failure with hypoxia: Secondary | ICD-10-CM | POA: Diagnosis not present

## 2023-03-30 DIAGNOSIS — Z7901 Long term (current) use of anticoagulants: Secondary | ICD-10-CM | POA: Diagnosis not present

## 2023-03-30 DIAGNOSIS — Y9269 Other specified industrial and construction area as the place of occurrence of the external cause: Secondary | ICD-10-CM

## 2023-03-30 DIAGNOSIS — S72001S Fracture of unspecified part of neck of right femur, sequela: Secondary | ICD-10-CM | POA: Diagnosis not present

## 2023-03-30 DIAGNOSIS — Z823 Family history of stroke: Secondary | ICD-10-CM

## 2023-03-30 DIAGNOSIS — S7290XA Unspecified fracture of unspecified femur, initial encounter for closed fracture: Secondary | ICD-10-CM | POA: Diagnosis present

## 2023-03-30 DIAGNOSIS — Z981 Arthrodesis status: Secondary | ICD-10-CM

## 2023-03-30 DIAGNOSIS — J449 Chronic obstructive pulmonary disease, unspecified: Secondary | ICD-10-CM | POA: Diagnosis present

## 2023-03-30 DIAGNOSIS — I4819 Other persistent atrial fibrillation: Secondary | ICD-10-CM | POA: Diagnosis present

## 2023-03-30 DIAGNOSIS — J690 Pneumonitis due to inhalation of food and vomit: Secondary | ICD-10-CM | POA: Diagnosis not present

## 2023-03-30 DIAGNOSIS — G4709 Other insomnia: Secondary | ICD-10-CM | POA: Diagnosis not present

## 2023-03-30 DIAGNOSIS — Z888 Allergy status to other drugs, medicaments and biological substances status: Secondary | ICD-10-CM

## 2023-03-30 DIAGNOSIS — W010XXA Fall on same level from slipping, tripping and stumbling without subsequent striking against object, initial encounter: Secondary | ICD-10-CM | POA: Diagnosis present

## 2023-03-30 DIAGNOSIS — Z7409 Other reduced mobility: Secondary | ICD-10-CM | POA: Diagnosis present

## 2023-03-30 DIAGNOSIS — W19XXXA Unspecified fall, initial encounter: Principal | ICD-10-CM

## 2023-03-30 DIAGNOSIS — Z8781 Personal history of (healed) traumatic fracture: Secondary | ICD-10-CM

## 2023-03-30 DIAGNOSIS — S72141A Displaced intertrochanteric fracture of right femur, initial encounter for closed fracture: Principal | ICD-10-CM | POA: Diagnosis present

## 2023-03-30 DIAGNOSIS — Z825 Family history of asthma and other chronic lower respiratory diseases: Secondary | ICD-10-CM

## 2023-03-30 DIAGNOSIS — Z79899 Other long term (current) drug therapy: Secondary | ICD-10-CM

## 2023-03-30 DIAGNOSIS — R000 Tachycardia, unspecified: Secondary | ICD-10-CM | POA: Diagnosis not present

## 2023-03-30 LAB — COMPREHENSIVE METABOLIC PANEL
ALT: 16 U/L (ref 0–44)
AST: 29 U/L (ref 15–41)
Albumin: 3.5 g/dL (ref 3.5–5.0)
Alkaline Phosphatase: 89 U/L (ref 38–126)
Anion gap: 9 (ref 5–15)
BUN: 14 mg/dL (ref 8–23)
CO2: 27 mmol/L (ref 22–32)
Calcium: 8.7 mg/dL — ABNORMAL LOW (ref 8.9–10.3)
Chloride: 105 mmol/L (ref 98–111)
Creatinine, Ser: 1.51 mg/dL — ABNORMAL HIGH (ref 0.61–1.24)
GFR, Estimated: 46 mL/min — ABNORMAL LOW (ref >60)
Glucose, Bld: 111 mg/dL — ABNORMAL HIGH (ref 70–99)
Potassium: 4 mmol/L (ref 3.5–5.1)
Sodium: 141 mmol/L (ref 135–145)
Total Bilirubin: 0.9 mg/dL (ref 0.0–1.2)
Total Protein: 6.5 g/dL (ref 6.5–8.1)

## 2023-03-30 LAB — CBC WITH DIFFERENTIAL/PLATELET
Abs Immature Granulocytes: 0.06 10*3/uL (ref 0.00–0.07)
Basophils Absolute: 0 10*3/uL (ref 0.0–0.1)
Basophils Relative: 0 %
Eosinophils Absolute: 0.2 10*3/uL (ref 0.0–0.5)
Eosinophils Relative: 3 %
HCT: 36 % — ABNORMAL LOW (ref 39.0–52.0)
Hemoglobin: 11.9 g/dL — ABNORMAL LOW (ref 13.0–17.0)
Immature Granulocytes: 1 %
Lymphocytes Relative: 19 %
Lymphs Abs: 0.9 10*3/uL (ref 0.7–4.0)
MCH: 32.1 pg (ref 26.0–34.0)
MCHC: 33.1 g/dL (ref 30.0–36.0)
MCV: 97 fL (ref 80.0–100.0)
Monocytes Absolute: 0.3 10*3/uL (ref 0.1–1.0)
Monocytes Relative: 7 %
Neutro Abs: 3.5 10*3/uL (ref 1.7–7.7)
Neutrophils Relative %: 70 %
Platelets: 94 10*3/uL — ABNORMAL LOW (ref 150–400)
RBC: 3.71 MIL/uL — ABNORMAL LOW (ref 4.22–5.81)
RDW: 13.4 % (ref 11.5–15.5)
WBC: 5 10*3/uL (ref 4.0–10.5)
nRBC: 0 % (ref 0.0–0.2)

## 2023-03-30 LAB — CBC
HCT: 38.9 % — ABNORMAL LOW (ref 39.0–52.0)
Hemoglobin: 12.9 g/dL — ABNORMAL LOW (ref 13.0–17.0)
MCH: 31.9 pg (ref 26.0–34.0)
MCHC: 33.2 g/dL (ref 30.0–36.0)
MCV: 96.3 fL (ref 80.0–100.0)
Platelets: 98 10*3/uL — ABNORMAL LOW (ref 150–400)
RBC: 4.04 MIL/uL — ABNORMAL LOW (ref 4.22–5.81)
RDW: 13.5 % (ref 11.5–15.5)
WBC: 9 10*3/uL (ref 4.0–10.5)
nRBC: 0 % (ref 0.0–0.2)

## 2023-03-30 LAB — CREATININE, SERUM
Creatinine, Ser: 1.56 mg/dL — ABNORMAL HIGH (ref 0.61–1.24)
GFR, Estimated: 44 mL/min — ABNORMAL LOW (ref >60)

## 2023-03-30 LAB — PROTIME-INR
INR: 1.5 — ABNORMAL HIGH (ref 0.8–1.2)
Prothrombin Time: 18.6 s — ABNORMAL HIGH (ref 11.4–15.2)

## 2023-03-30 LAB — IMMATURE PLATELET FRACTION: Immature Platelet Fraction: 5 % (ref 1.2–8.6)

## 2023-03-30 MED ORDER — MORPHINE SULFATE (PF) 4 MG/ML IV SOLN
4.0000 mg | Freq: Once | INTRAVENOUS | Status: AC
  Administered 2023-03-30: 4 mg via INTRAVENOUS
  Filled 2023-03-30: qty 1

## 2023-03-30 MED ORDER — OXYCODONE HCL 5 MG PO TABS
5.0000 mg | ORAL_TABLET | ORAL | Status: DC | PRN
  Administered 2023-03-30 – 2023-03-31 (×4): 5 mg via ORAL
  Filled 2023-03-30 (×4): qty 1

## 2023-03-30 MED ORDER — ACETAMINOPHEN 650 MG RE SUPP: 650.0000 mg | Freq: Four times a day (QID) | RECTAL | Status: DC | PRN

## 2023-03-30 MED ORDER — ACETAMINOPHEN 325 MG PO TABS
650.0000 mg | ORAL_TABLET | Freq: Four times a day (QID) | ORAL | Status: DC | PRN
  Administered 2023-04-02: 650 mg via ORAL
  Filled 2023-03-30: qty 2

## 2023-03-30 MED ORDER — SODIUM CHLORIDE 0.9% FLUSH
3.0000 mL | Freq: Two times a day (BID) | INTRAVENOUS | Status: DC
  Administered 2023-03-30 – 2023-04-08 (×17): 3 mL via INTRAVENOUS

## 2023-03-30 MED ORDER — POLYETHYLENE GLYCOL 3350 17 G PO PACK: 17.0000 g | PACK | Freq: Every day | ORAL | Status: DC | PRN

## 2023-03-30 MED ORDER — ENOXAPARIN SODIUM 40 MG/0.4ML IJ SOSY
40.0000 mg | PREFILLED_SYRINGE | INTRAMUSCULAR | Status: DC
  Administered 2023-03-31 – 2023-04-01 (×2): 40 mg via SUBCUTANEOUS
  Filled 2023-03-30 (×2): qty 0.4

## 2023-03-30 MED ORDER — SENNOSIDES-DOCUSATE SODIUM 8.6-50 MG PO TABS
2.0000 | ORAL_TABLET | Freq: Two times a day (BID) | ORAL | Status: DC
  Administered 2023-03-30 – 2023-04-08 (×10): 2 via ORAL
  Filled 2023-03-30 (×17): qty 2

## 2023-03-30 MED ORDER — HYDROMORPHONE HCL 1 MG/ML IJ SOLN
0.5000 mg | INTRAMUSCULAR | Status: DC | PRN
  Administered 2023-03-30 – 2023-04-02 (×6): 0.5 mg via INTRAVENOUS
  Filled 2023-03-30 (×3): qty 0.5
  Filled 2023-03-30: qty 1
  Filled 2023-03-30 (×2): qty 0.5

## 2023-03-30 MED ORDER — OXYCODONE HCL ER 10 MG PO T12A
10.0000 mg | EXTENDED_RELEASE_TABLET | Freq: Two times a day (BID) | ORAL | Status: AC
  Administered 2023-03-30 (×2): 10 mg via ORAL
  Filled 2023-03-30 (×2): qty 1

## 2023-03-30 MED ORDER — ACETAMINOPHEN 325 MG PO TABS
650.0000 mg | ORAL_TABLET | ORAL | Status: DC
  Administered 2023-03-30 – 2023-04-01 (×7): 650 mg via ORAL
  Filled 2023-03-30 (×8): qty 2

## 2023-03-30 MED ORDER — MUPIROCIN 2 % EX OINT
1.0000 | TOPICAL_OINTMENT | Freq: Two times a day (BID) | CUTANEOUS | Status: AC
  Administered 2023-03-30 – 2023-04-03 (×7): 1 via NASAL
  Filled 2023-03-30 (×3): qty 22

## 2023-03-30 MED ORDER — METOPROLOL TARTRATE 25 MG PO TABS
25.0000 mg | ORAL_TABLET | Freq: Two times a day (BID) | ORAL | Status: DC
  Administered 2023-03-30 – 2023-04-02 (×7): 25 mg via ORAL
  Filled 2023-03-30 (×7): qty 1

## 2023-03-30 NOTE — ED Notes (Signed)
 Pt has bruising under left chest from a prior incident and great left toe controlled bleeding from pt cutting toe nails.

## 2023-03-30 NOTE — Progress Notes (Signed)
 Ortho Trauma Note  Received consult from Dr. Beuford who is on call for orthopaedics. Patient has history of IMN of right femur with me, now with intertrochanteric femur fracture above this. Will need removal of hardware and IM nailing of right hip fracture, will plan for surgery tomorrow. NPO at midnight, and hold anticoagulation.  Franky MYRTIS Light, MD [facility] [phone] (office) [email or web address]

## 2023-03-30 NOTE — ED Triage Notes (Signed)
 Pt bib ems from home c/o mechanical fall. Denies hitting head. Right knee pain radiating up leg and hip is stable. Pt has prior surgery to right knee. Afib (Eliquis )   18g Lft AC  Fentanyl  100 mcg  Pain 7/10  BP 146/74 HR 90 irregular

## 2023-03-30 NOTE — ED Provider Notes (Signed)
 [place] EMERGENCY DEPARTMENT AT [facility] Provider Note   CSN: [id number] Arrival date & time: 03/30/23  1002     History  Chief Complaint  Patient presents with   Fall   Knee Pain    Dustin [name] is a 83 y.o. male.  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 He reports falling to the right knee. No other complaint. Did not hit his head or pass out. Has not been able to ambulate since the fall which happened just prior to arrival. No chest or abdominal pain. He is anticoagulated on Eliquis  for atrial fibrillation.   The history is provided by the patient. No language interpreter was used.  Fall  Knee Pain      Home Medications Prior to Admission medications   Medication Sig Start Date End Date Taking? Authorizing Provider  apixaban  (ELIQUIS ) 5 MG TABS tablet Take 5 mg by mouth 2 (two) times daily. 06/23/20   [provider]  DULoxetine  (CYMBALTA ) 60 MG capsule Take 60 mg by mouth daily. 05/08/22   [provider]  FARXIGA  10 MG TABS tablet Take 10 mg by mouth daily. 10/10/21   [provider]  ferrous sulfate  325 (65 FE) MG tablet Take 1 tablet (325 mg total) by mouth daily with breakfast. 06/14/22   Lue Elsie BROCKS, MD  gabapentin  (NEURONTIN ) 300 MG capsule Take 1 capsule (300 mg total) by mouth 3 (three) times daily. Patient taking differently: Take 600 mg by mouth 3 (three) times daily. 11/15/22   Briana Elgin LABOR, MD  ibuprofen (ADVIL) 200 MG tablet Take 400 mg by mouth as needed for moderate pain.    [provider]  metoprolol  tartrate (LOPRESSOR ) 25 MG tablet Take 25 mg by mouth 2 (two) times daily. 06/23/20   [provider]  Multiple Vitamin (MULTIVITAMIN WITH MINERALS) TABS tablet Take 1 tablet by mouth daily. 03/18/20   Marilee Leach, MD  pantoprazole  (PROTONIX ) 40 MG tablet Take 1 tablet (40 mg total) by mouth daily. 03/18/20   Marilee Leach, MD  QUEtiapine   (SEROQUEL ) 300 MG tablet Take 600 mg by mouth at bedtime. 10/16/21   [provider]  silodosin (RAPAFLO) 8 MG CAPS capsule Take 8 mg by mouth daily. 03/18/22   [provider]  Vitamin D , Ergocalciferol , (DRISDOL ) 1.25 MG (50000 UNIT) CAPS capsule Take 50,000 Units by mouth once a week. Thursdays 09/09/21   [provider]      Allergies    Desmopressin acetate and Zoloft  [sertraline ]    Review of Systems   Review of Systems  Physical Exam Updated Vital Signs BP (!) 137/101   Pulse 92   Temp 97.8 F (36.6 C) (Oral)   Resp 20   Ht 6' 2 (1.88 m)   Wt 95.3 kg   SpO2 98%   BMI 26.96 kg/m  Physical Exam Vitals and nursing note reviewed.  Constitutional:      Appearance: He is well-developed.  HENT:     Head: Normocephalic.  Cardiovascular:     Rate and Rhythm: Normal rate and regular rhythm.  Pulmonary:     Effort: Pulmonary effort is normal.     Breath sounds: Normal breath sounds. No wheezing, rhonchi or rales.  Chest:     Chest wall: No tenderness.  Abdominal:     General: There is no distension.     Palpations: Abdomen is soft.     Tenderness: There is no abdominal tenderness.  There is no guarding or rebound.  Musculoskeletal:     Cervical back: Normal range of motion and neck supple.     Comments: UE's and left LE maintain FROM. No bony deformities. Right LE shortened and rotated. There is tenderness to anterior knee but no swelling or bruising. Hip nontender to palpation but tender to movement.   Skin:    General: Skin is warm and dry.  Neurological:     General: No focal deficit present.     Mental Status: He is alert and oriented to person, place, and time.     ED Results / Procedures / Treatments   Labs (all labs ordered are listed, but only abnormal results are displayed) Labs Reviewed  CBC WITH DIFFERENTIAL/PLATELET - Abnormal; Notable for the following components:      Result Value   RBC 3.71 (*)    Hemoglobin 11.9 (*)    HCT  36.0 (*)    Platelets 94 (*)    All other components within normal limits  PROTIME-INR - Abnormal; Notable for the following components:   Prothrombin Time 18.6 (*)    INR 1.5 (*)    All other components within normal limits  COMPREHENSIVE METABOLIC PANEL - Abnormal; Notable for the following components:   Glucose, Bld 111 (*)    Creatinine, Ser 1.51 (*)    Calcium 8.7 (*)    GFR, Estimated 46 (*)    All other components within normal limits  IMMATURE PLATELET FRACTION  URINALYSIS, ROUTINE W REFLEX MICROSCOPIC  CBC  CREATININE, SERUM    EKG EKG Interpretation Date/Time:  Sunday March 30 2023 10:20:22 EST Ventricular Rate:  85 PR Interval:    QRS Duration:  94 QT Interval:  398 QTC Calculation: 474 R Axis:   54  Text Interpretation: Atrial fibrillation Confirmed by Bernard Drivers ([id number]) on 03/30/2023 12:22:56 PM  Radiology DG Chest 1 View Result Date: 03/30/2023 CLINICAL DATA:  [id number] Preoperative cardiovascular examination [id number], right hip fracture EXAM: CHEST  1 VIEW COMPARISON:  11/12/2022 chest radiograph. FINDINGS: Partially visualized surgical hardware from ACDF. Stable cardiomediastinal silhouette with mild cardiomegaly. No pneumothorax. No pleural effusion. Lungs appear clear, with no acute consolidative airspace disease and no pulmonary edema. IMPRESSION: Mild cardiomegaly without pulmonary edema. No active pulmonary disease. Electronically Signed   By: Selinda DELENA Blue M.D.   On: 03/30/2023 12:51   DG Hip Unilat W or Wo Pelvis 2-3 Views Right Result Date: 03/30/2023 CLINICAL DATA:  Right knee and hip pain status post fall EXAM: RIGHT KNEE - COMPLETE 4+ VIEW; DG HIP (WITH OR WITHOUT PELVIS) 2-3V RIGHT COMPARISON:  Right femur radiographs 03/08/2020 FINDINGS: Right hip: Comminuted, mildly displaced right intertrochanteric femur fracture. Visualized portions of the right femur intramedullary rod are intact. The fracture does not clearly extend to the intramedullary rod, however  this should be confirmed on CT. Multiple surgical clips in the midline pelvis likely due to prior prostatectomy. Right knee: No fracture or dislocation. Distal segment of femoral intramedullary rod with interlocking screws are intact without periprosthetic fracture or lucency. IMPRESSION: 1. Comminuted, mildly displaced right intertrochanteric femur fracture. 2. Visualized portions of the femoral intramedullary nail are intact. The proximal femur fracture does not definitively extend to the intramedullary rod, however if confirmation is needed, CT should be performed. 3. No acute abnormality of the right knee. Electronically Signed   By: Aliene Lloyd M.D.   On: 03/30/2023 11:24   DG Knee Complete 4 Views Right Result Date: 03/30/2023 CLINICAL DATA:  Right  knee and hip pain status post fall EXAM: RIGHT KNEE - COMPLETE 4+ VIEW; DG HIP (WITH OR WITHOUT PELVIS) 2-3V RIGHT COMPARISON:  Right femur radiographs 03/08/2020 FINDINGS: Right hip: Comminuted, mildly displaced right intertrochanteric femur fracture. Visualized portions of the right femur intramedullary rod are intact. The fracture does not clearly extend to the intramedullary rod, however this should be confirmed on CT. Multiple surgical clips in the midline pelvis likely due to prior prostatectomy. Right knee: No fracture or dislocation. Distal segment of femoral intramedullary rod with interlocking screws are intact without periprosthetic fracture or lucency. IMPRESSION: 1. Comminuted, mildly displaced right intertrochanteric femur fracture. 2. Visualized portions of the femoral intramedullary nail are intact. The proximal femur fracture does not definitively extend to the intramedullary rod, however if confirmation is needed, CT should be performed. 3. No acute abnormality of the right knee. Electronically Signed   By: Aliene Lloyd M.D.   On: 03/30/2023 11:24    Procedures Procedures    Medications Ordered in ED Medications  oxyCODONE  (OXYCONTIN )  12 hr tablet 10 mg (10 mg Oral Given 03/30/23 1215)  senna-docusate (Senokot-S) tablet 2 tablet (2 tablets Oral Given 03/30/23 1215)  acetaminophen  (TYLENOL ) tablet 650 mg (650 mg Oral Given 03/30/23 1214)  oxyCODONE  (Oxy IR/ROXICODONE ) immediate release tablet 5 mg (has no administration in time range)  HYDROmorphone  (DILAUDID ) injection 0.5 mg (0.5 mg Intravenous Given 03/30/23 1341)  metoprolol  tartrate (LOPRESSOR ) tablet 25 mg (25 mg Oral Given 03/30/23 1341)  enoxaparin  (LOVENOX ) injection 40 mg (has no administration in time range)  acetaminophen  (TYLENOL ) tablet 650 mg (has no administration in time range)    Or  acetaminophen  (TYLENOL ) suppository 650 mg (has no administration in time range)  polyethylene glycol (MIRALAX  / GLYCOLAX ) packet 17 g (has no administration in time range)  sodium chloride  flush (NS) 0.9 % injection 3 mL (3 mLs Intravenous Not Given 03/30/23 1336)  morphine  (PF) 4 MG/ML injection 4 mg (4 mg Intravenous Given 03/30/23 1038)  morphine  (PF) 4 MG/ML injection 4 mg (4 mg Intravenous Given 03/30/23 1135)    ED Course/ Medical Decision Making/ A&P                                 Medical Decision Making Patient to ED after mechanical fall after tripping. Isolated injury to right LE, he reports knee > hip, however, leg is shortened and rotated externally.   Xray showing intertrochanteric fracture of the right hip. Intermedullary rod in place without visualized associated fracture.   Discussed with the hospitalist who accepts for admission. Orthopedics: Dr. Kendal.   Amount and/or Complexity of Data Reviewed Labs: ordered. Radiology: ordered.  Risk Prescription drug management. Decision regarding hospitalization.           Final Clinical Impression(s) / ED Diagnoses Final diagnoses:  Fall, initial encounter  Closed fracture of right hip, initial encounter [facility])  Coagulopathy [facility])    Rx / DC Orders ED Discharge Orders     None         Odell Balls, PA-C 03/30/23 1342    Bernard Drivers, MD 03/31/23 1556

## 2023-03-30 NOTE — Assessment & Plan Note (Signed)
 This is noted to be chronic in the chart going on for at least 4 months at the current level.  I will check a immature platelet fraction.  No clinical bleeding at this time.

## 2023-03-30 NOTE — Plan of Care (Signed)

## 2023-03-30 NOTE — Assessment & Plan Note (Signed)
 Patient has previously had intramedullary nailing done on the right femur.  Patient now presents with mechanical fall with intertrochanteric fracture of the same site.  Ortho has been engaged by ER provider.  From our perspective patient will be ordered on DVT prophylaxis 40 mg daily.  Patient thrombocytopenia I think should not be a barrier to surgery.  Regarding Eliquis  use, will work with surgery regarding specific procedure anticipated.  Typically 24 hours after Eliquis  use Mohs surgical procedure should be good to go.

## 2023-03-30 NOTE — Assessment & Plan Note (Signed)
 This is chronic, currently appears rate controlled.  Patient is on Eliquis  at home, took last dose today.  Obviously I will hold the Eliquis  because anticipate surgery will operate on this case.

## 2023-03-30 NOTE — Assessment & Plan Note (Signed)
 Related to right femoral intertrochanteric fracture.  Ankle uncontrolled.  Patient s/p 4 mg of morphine .  At this time I will start the patient on OxyContin  10 mg twice daily for 3 doses.  Given patient's CKD, not a candidate for celecoxib therapy.  I will start the patient on standing acetaminophen .  Patient will have a 5 mg oxycodone  every 4 hours as needed available for moderate pain as well as Dilaudid  0.5 mg every 2 hours as needed for severe pain or for breakthrough pain.  Bowel regimen ordered with senna and docusate.

## 2023-03-30 NOTE — H&P (Signed)
 History and Physical    Patient: Dustin [name] FMW:[id number] DOB: 1940/06/21 DOA: 03/30/2023 DOS: the patient was seen and examined on 03/30/2023 PCP: Regino Slater, MD  Patient coming from: Home  Chief Complaint:  Chief Complaint  Patient presents with   Fall   Knee Pain   HPI: [name] is a 83 y.o. male with medical history significant of chronic atrial fibrillation for which patient is on apixaban  at home chronically.  Last dose today.  Patient reports being in his usual state health health this morning he was actually working at his wood shop.  Unfortunately tripped on a power cord in the shop and fell, followed by severe pain in the right thigh knee area.  Patient fortunately had his cell phone on him and was able to call emergency services and is brought to [facility] ER.  There is no neck pain head trauma upper extremity pain.  Patient denies any back pain.  No left lower extremity pain. Patient is complaining of ongoing pain in the right knee which is still uncomfortable in spite of getting 4 mg of morphine .  ER workup is notable for right intertrochanteric femoral fracture.  Patient has a prior indwelling intramedullary nail in the site.  Review of Systems: As mentioned in the history of present illness. All other systems reviewed and are negative. Past Medical History:  Diagnosis Date   BPH (benign prostatic hyperplasia)    Chronic combined systolic and diastolic congestive heart failure (HCC)    GERD (gastroesophageal reflux disease)    protonix  for control   Insomnia    Neck pain    Neuropathy    Past Surgical History:  Procedure Laterality Date   ANTERIOR CERVICAL DECOMPRESSION/DISCECTOMY FUSION 4 LEVELS N/A 10/27/2012   Procedure: Cervical Three-Four Cervical Four-Five Cervical Five-Six Cervical Six-Seven  Anterior cervical decompression/diskectomy/fusion;  Surgeon: Fairy Levels, MD;  Location: [facility] NEURO ORS;  Service: Neurosurgery;  Laterality: N/A;  Cervical  Three-Four Cervical Four-Five Cervical Five-Six Cervical Six-Seven  Anterior cervical decompression/diskectomy/fusion   CARDIOVERSION N/A 09/20/2020   Procedure: CARDIOVERSION;  Surgeon: Kate Lonni CROME, MD;  Location: [facility] ENDOSCOPY;  Service: Cardiovascular;  Laterality: N/A;   FEMUR IM NAIL Right 03/08/2020   Procedure: INTRAMEDULLARY (IM) RETROGRADE FEMORAL NAILING;  Surgeon: Kendal Franky SQUIBB, MD;  Location: [facility] OR;  Service: Orthopedics;  Laterality: Right;   SHOULDER ARTHROSCOPY  2010   rt shoulder   SHOULDER ARTHROSCOPY  02/15/2011   Procedure: ARTHROSCOPY SHOULDER;  Surgeon: Lamar Prentice Collet;  Location: [facility];  Service: Orthopedics;  Laterality: Right;  Debridement   TONSILLECTOMY     Social History:  reports that he quit smoking about 33 years ago. His smoking use included cigarettes. He has never used smokeless tobacco. He reports that he does not drink alcohol  and does not use drugs.  Allergies  Allergen Reactions   Desmopressin Acetate Other (See Comments)    Hyponatremia    Zoloft  [Sertraline ] Other (See Comments)    Felt bad - Patient denies    Family History  Problem Relation Age of Onset   Emphysema Mother    Cirrhosis Father    Stroke Brother     Prior to Admission medications   Medication Sig Start Date End Date Taking? Authorizing Provider  apixaban  (ELIQUIS ) 5 MG TABS tablet Take 5 mg by mouth 2 (two) times daily. 06/23/20   [provider]  DULoxetine  (CYMBALTA ) 60 MG capsule Take 60 mg by mouth daily. 05/08/22   [provider]  FARXIGA  10 MG TABS tablet Take 10 mg by mouth daily. 10/10/21   [provider]  ferrous sulfate  325 (65 FE) MG tablet Take 1 tablet (325 mg total) by mouth daily with breakfast. 06/14/22   Lue Elsie BROCKS, MD  gabapentin  (NEURONTIN ) 300 MG capsule Take 1 capsule (300 mg total) by mouth 3 (three) times daily. Patient taking differently: Take 600 mg by mouth 3 (three) times daily.  11/15/22   Briana Elgin LABOR, MD  ibuprofen (ADVIL) 200 MG tablet Take 400 mg by mouth as needed for moderate pain.    [provider]  metoprolol  tartrate (LOPRESSOR ) 25 MG tablet Take 25 mg by mouth 2 (two) times daily. 06/23/20   [provider]  Multiple Vitamin (MULTIVITAMIN WITH MINERALS) TABS tablet Take 1 tablet by mouth daily. 03/18/20   Marilee Leach, MD  pantoprazole  (PROTONIX ) 40 MG tablet Take 1 tablet (40 mg total) by mouth daily. 03/18/20   Marilee Leach, MD  QUEtiapine  (SEROQUEL ) 300 MG tablet Take 600 mg by mouth at bedtime. 10/16/21   [provider]  silodosin (RAPAFLO) 8 MG CAPS capsule Take 8 mg by mouth daily. 03/18/22   [provider]  Vitamin D , Ergocalciferol , (DRISDOL ) 1.25 MG (50000 UNIT) CAPS capsule Take 50,000 Units by mouth once a week. Thursdays 09/09/21   [provider]    Physical Exam: Vitals:   03/30/23 1018 03/30/23 1020 03/30/23 1021 03/30/23 1030  BP: (!) 140/77   127/80  Pulse:  79  92  Resp:  12  20  Temp: 97.8 F (36.6 C)     TempSrc: Oral     SpO2:   95% 98%  Weight:      Height:       General: Patient is alert and awake, no physiologic distress besides the pain Respiratory exam: Bilateral intravesicular Cardiovascular exam S1-S2 normal Abdomen soft nontender Extremities warm without edema.  Neurologically patient is alert awake oriented x 3 gives a coherent account of his symptoms.  No focal deficit is noted including in the right lower extremity.  I did not manipulate the right hip. Musculoskeletal exam cranium is intact no trauma evidence Upper extremity no clinical fracture right leg and foot no clinical fracture.  left lower extremity no clinical fracture.  Patient at this time too uncomfortable for thorough spine palpation.  However I specifically asked the patient if he had any back pain.  He denied same. Data Reviewed:  Labs on Admission:  Results for orders placed or performed during the  hospital encounter of 03/30/23 (from the past 24 hours)  CBC with Differential     Status: Abnormal   Collection Time: 03/30/23 10:34 AM  Result Value Ref Range   WBC 5.0 4.0 - 10.5 K/uL   RBC 3.71 (L) 4.22 - 5.81 MIL/uL   Hemoglobin 11.9 (L) 13.0 - 17.0 g/dL   HCT 63.9 (L) 60.9 - 47.9 %   MCV 97.0 80.0 - 100.0 fL   MCH 32.1 26.0 - 34.0 pg   MCHC 33.1 30.0 - 36.0 g/dL   RDW 86.5 88.4 - 84.4 %   Platelets 94 (L) 150 - 400 K/uL   nRBC 0.0 0.0 - 0.2 %   Neutrophils Relative % 70 %   Neutro Abs 3.5 1.7 - 7.7 K/uL   Lymphocytes Relative 19 %   Lymphs Abs 0.9 0.7 - 4.0 K/uL   Monocytes Relative 7 %   Monocytes Absolute 0.3 0.1 - 1.0 K/uL   Eosinophils Relative 3 %  Eosinophils Absolute 0.2 0.0 - 0.5 K/uL   Basophils Relative 0 %   Basophils Absolute 0.0 0.0 - 0.1 K/uL   Immature Granulocytes 1 %   Abs Immature Granulocytes 0.06 0.00 - 0.07 K/uL  Protime-INR     Status: Abnormal   Collection Time: 03/30/23 10:34 AM  Result Value Ref Range   Prothrombin Time 18.6 (H) 11.4 - 15.2 seconds   INR 1.5 (H) 0.8 - 1.2  Comprehensive metabolic panel     Status: Abnormal   Collection Time: 03/30/23 10:34 AM  Result Value Ref Range   Sodium 141 135 - 145 mmol/L   Potassium 4.0 3.5 - 5.1 mmol/L   Chloride 105 98 - 111 mmol/L   CO2 27 22 - 32 mmol/L   Glucose, Bld 111 (H) 70 - 99 mg/dL   BUN 14 8 - 23 mg/dL   Creatinine, Ser 8.48 (H) 0.61 - 1.24 mg/dL   Calcium 8.7 (L) 8.9 - 10.3 mg/dL   Total Protein 6.5 6.5 - 8.1 g/dL   Albumin  3.5 3.5 - 5.0 g/dL   AST 29 15 - 41 U/L   ALT 16 0 - 44 U/L   Alkaline Phosphatase 89 38 - 126 U/L   Total Bilirubin 0.9 0.0 - 1.2 mg/dL   GFR, Estimated 46 (L) >60 mL/min   Anion gap 9 5 - 15   Basic Metabolic Panel: Recent Labs  Lab 03/30/23 1034  NA 141  K 4.0  CL 105  CO2 27  GLUCOSE 111*  BUN 14  CREATININE 1.51*  CALCIUM 8.7*   Liver Function Tests: Recent Labs  Lab 03/30/23 1034  AST 29  ALT 16  ALKPHOS 89  BILITOT 0.9  PROT 6.5   ALBUMIN  3.5   No results for input(s): LIPASE, AMYLASE in the last 168 hours. No results for input(s): AMMONIA in the last 168 hours. CBC: Recent Labs  Lab 03/30/23 1034  WBC 5.0  NEUTROABS 3.5  HGB 11.9*  HCT 36.0*  MCV 97.0  PLT 94*   Cardiac Enzymes: No results for input(s): CKTOTAL, CKMB, CKMBINDEX, TROPONINIHS in the last 168 hours.  BNP (last 3 results) No results for input(s): PROBNP in the last 8760 hours. CBG: No results for input(s): GLUCAP in the last 168 hours.  Radiological Exams on Admission:  DG Hip Unilat W or Wo Pelvis 2-3 Views Right Result Date: 03/30/2023 CLINICAL DATA:  Right knee and hip pain status post fall EXAM: RIGHT KNEE - COMPLETE 4+ VIEW; DG HIP (WITH OR WITHOUT PELVIS) 2-3V RIGHT COMPARISON:  Right femur radiographs 03/08/2020 FINDINGS: Right hip: Comminuted, mildly displaced right intertrochanteric femur fracture. Visualized portions of the right femur intramedullary rod are intact. The fracture does not clearly extend to the intramedullary rod, however this should be confirmed on CT. Multiple surgical clips in the midline pelvis likely due to prior prostatectomy. Right knee: No fracture or dislocation. Distal segment of femoral intramedullary rod with interlocking screws are intact without periprosthetic fracture or lucency. IMPRESSION: 1. Comminuted, mildly displaced right intertrochanteric femur fracture. 2. Visualized portions of the femoral intramedullary nail are intact. The proximal femur fracture does not definitively extend to the intramedullary rod, however if confirmation is needed, CT should be performed. 3. No acute abnormality of the right knee. Electronically Signed   By: Aliene Lloyd M.D.   On: 03/30/2023 11:24   DG Knee Complete 4 Views Right Result Date: 03/30/2023 CLINICAL DATA:  Right knee and hip pain status post fall EXAM: RIGHT KNEE -  COMPLETE 4+ VIEW; DG HIP (WITH OR WITHOUT PELVIS) 2-3V RIGHT COMPARISON:  Right  femur radiographs 03/08/2020 FINDINGS: Right hip: Comminuted, mildly displaced right intertrochanteric femur fracture. Visualized portions of the right femur intramedullary rod are intact. The fracture does not clearly extend to the intramedullary rod, however this should be confirmed on CT. Multiple surgical clips in the midline pelvis likely due to prior prostatectomy. Right knee: No fracture or dislocation. Distal segment of femoral intramedullary rod with interlocking screws are intact without periprosthetic fracture or lucency. IMPRESSION: 1. Comminuted, mildly displaced right intertrochanteric femur fracture. 2. Visualized portions of the femoral intramedullary nail are intact. The proximal femur fracture does not definitively extend to the intramedullary rod, however if confirmation is needed, CT should be performed. 3. No acute abnormality of the right knee. Electronically Signed   By: Aliene Lloyd M.D.   On: 03/30/2023 11:24    chest X-ray  EKG: Independently reviewed.  Rate controlled atrial fibrillation no ST-T wave changes  No intake/output data recorded. No intake/output data recorded.       Assessment and Plan: * Femur fracture (HCC) Patient has previously had intramedullary nailing done on the right femur.  Patient now presents with mechanical fall with intertrochanteric fracture of the same site.  Ortho has been engaged by ER provider.  From our perspective patient will be ordered on DVT prophylaxis 40 mg daily.  Patient thrombocytopenia I think should not be a barrier to surgery.  Regarding Eliquis  use, will work with surgery regarding specific procedure anticipated.  Typically 24 hours after Eliquis  use Mohs surgical procedure should be good to go.  Persistent atrial fibrillation (HCC) This is chronic, currently appears rate controlled.  Patient is on Eliquis  at home, took last dose today.  Obviously I will hold the Eliquis  because anticipate surgery will operate on this  case.  Pain Related to right femoral intertrochanteric fracture.  Ankle uncontrolled.  Patient s/p 4 mg of morphine .  At this time I will start the patient on OxyContin  10 mg twice daily for 3 doses.  Given patient's CKD, not a candidate for celecoxib therapy.  I will start the patient on standing acetaminophen .  Patient will have a 5 mg oxycodone  every 4 hours as needed available for moderate pain as well as Dilaudid  0.5 mg every 2 hours as needed for severe pain or for breakthrough pain.  Bowel regimen ordered with senna and docusate.  Thrombocytopenia (HCC) This is noted to be chronic in the chart going on for at least 4 months at the current level.  I will check a immature platelet fraction.  No clinical bleeding at this time.    Med rec pending pharmacy input. Please review. C.w.. metoprolol  25 bid.   Advance Care Planning:   Code Status: Prior patient wishes to be DNR/DNI.  This is consistent with patient's previous lead documented CODE STATUS is in the chart.  I did offered the patient to call his family members, however patient said that he has been in touch with family and declined.  Consults: orthopedics. Dr. Dr. Kendal  engaged by ER provider.     Family Communication: as above. Patient deferred. Patient will talk to family  Severity of Illness: The appropriate patient status for this patient is INPATIENT. Inpatient status is judged to be reasonable and necessary in order to provide the required intensity of service to ensure the patient's safety. The patient's presenting symptoms, physical exam findings, and initial radiographic and laboratory data in the context of their  chronic comorbidities is felt to place them at high risk for further clinical deterioration. Furthermore, it is not anticipated that the patient will be medically stable for discharge from the hospital within 2 midnights of admission.   * I certify that at the point of admission it is my clinical judgment that the  patient will require inpatient hospital care spanning beyond 2 midnights from the point of admission due to high intensity of service, high risk for further deterioration and high frequency of surveillance required.*  Author: Jacqulyn Divine, MD 03/30/2023 12:08 PM  For on call review [email or web address].

## 2023-03-31 ENCOUNTER — Inpatient Hospital Stay (HOSPITAL_COMMUNITY): Payer: Medicare Other

## 2023-03-31 ENCOUNTER — Other Ambulatory Visit: Payer: Self-pay

## 2023-03-31 ENCOUNTER — Inpatient Hospital Stay (HOSPITAL_COMMUNITY): Payer: Medicare Other | Admitting: Anesthesiology

## 2023-03-31 ENCOUNTER — Encounter (HOSPITAL_COMMUNITY)
Admission: EM | Disposition: A | Discharge status: Skilled Nursing Facility | Payer: Self-pay | Source: Home / Self Care | Attending: Internal Medicine

## 2023-03-31 ENCOUNTER — Encounter (HOSPITAL_COMMUNITY): Payer: Self-pay | Admitting: Internal Medicine

## 2023-03-31 DIAGNOSIS — S72001S Fracture of unspecified part of neck of right femur, sequela: Secondary | ICD-10-CM | POA: Diagnosis not present

## 2023-03-31 DIAGNOSIS — S72141A Displaced intertrochanteric fracture of right femur, initial encounter for closed fracture: Secondary | ICD-10-CM

## 2023-03-31 DIAGNOSIS — I5042 Chronic combined systolic (congestive) and diastolic (congestive) heart failure: Secondary | ICD-10-CM

## 2023-03-31 DIAGNOSIS — J449 Chronic obstructive pulmonary disease, unspecified: Secondary | ICD-10-CM

## 2023-03-31 DIAGNOSIS — Z87891 Personal history of nicotine dependence: Secondary | ICD-10-CM

## 2023-03-31 HISTORY — PX: INTRAMEDULLARY (IM) NAIL INTERTROCHANTERIC: SHX5875

## 2023-03-31 HISTORY — PX: HARDWARE REMOVAL: SHX979

## 2023-03-31 LAB — BASIC METABOLIC PANEL
Anion gap: 17 — ABNORMAL HIGH (ref 5–15)
BUN: 15 mg/dL (ref 8–23)
CO2: 23 mmol/L (ref 22–32)
Calcium: 8.9 mg/dL (ref 8.9–10.3)
Chloride: 97 mmol/L — ABNORMAL LOW (ref 98–111)
Creatinine, Ser: 1.45 mg/dL — ABNORMAL HIGH (ref 0.61–1.24)
GFR, Estimated: 48 mL/min — ABNORMAL LOW (ref >60)
Glucose, Bld: 101 mg/dL — ABNORMAL HIGH (ref 70–99)
Potassium: 3.7 mmol/L (ref 3.5–5.1)
Sodium: 137 mmol/L (ref 135–145)

## 2023-03-31 LAB — CBC
HCT: 37.1 % — ABNORMAL LOW (ref 39.0–52.0)
Hemoglobin: 12.6 g/dL — ABNORMAL LOW (ref 13.0–17.0)
MCH: 32.3 pg (ref 26.0–34.0)
MCHC: 34 g/dL (ref 30.0–36.0)
MCV: 95.1 fL (ref 80.0–100.0)
Platelets: 93 10*3/uL — ABNORMAL LOW (ref 150–400)
RBC: 3.9 MIL/uL — ABNORMAL LOW (ref 4.22–5.81)
RDW: 13.6 % (ref 11.5–15.5)
WBC: 9.2 10*3/uL (ref 4.0–10.5)
nRBC: 0 % (ref 0.0–0.2)

## 2023-03-31 LAB — SURGICAL PCR SCREEN
MRSA, PCR: NEGATIVE
Staphylococcus aureus: NEGATIVE

## 2023-03-31 LAB — VITAMIN D 25 HYDROXY (VIT D DEFICIENCY, FRACTURES): Vit D, 25-Hydroxy: 65.63 ng/mL (ref 30–100)

## 2023-03-31 LAB — PROTIME-INR
INR: 1.3 — ABNORMAL HIGH (ref 0.8–1.2)
Prothrombin Time: 16.2 s — ABNORMAL HIGH (ref 11.4–15.2)

## 2023-03-31 LAB — APTT: aPTT: 57 s — ABNORMAL HIGH (ref 24–36)

## 2023-03-31 SURGERY — REMOVAL, HARDWARE
Anesthesia: General | Laterality: Right

## 2023-03-31 MED ORDER — METOCLOPRAMIDE HCL 5 MG PO TABS: 5.0000 mg | ORAL_TABLET | Freq: Three times a day (TID) | ORAL | Status: DC | PRN

## 2023-03-31 MED ORDER — 0.9 % SODIUM CHLORIDE (POUR BTL) OPTIME
TOPICAL | Status: DC | PRN
  Administered 2023-03-31: 1000 mL

## 2023-03-31 MED ORDER — SODIUM CHLORIDE 0.9 % IV SOLN: INTRAVENOUS | Status: DC

## 2023-03-31 MED ORDER — CEFAZOLIN SODIUM-DEXTROSE 2-3 GM-%(50ML) IV SOLR
INTRAVENOUS | Status: DC | PRN
  Administered 2023-03-31: 2 g via INTRAVENOUS

## 2023-03-31 MED ORDER — METOCLOPRAMIDE HCL 5 MG/ML IJ SOLN: 5.0000 mg | Freq: Three times a day (TID) | INTRAMUSCULAR | Status: DC | PRN

## 2023-03-31 MED ORDER — ADULT MULTIVITAMIN W/MINERALS CH
1.0000 | ORAL_TABLET | Freq: Every day | ORAL | Status: DC
  Administered 2023-03-31 – 2023-04-08 (×9): 1 via ORAL
  Filled 2023-03-31 (×9): qty 1

## 2023-03-31 MED ORDER — DEXAMETHASONE SODIUM PHOSPHATE 10 MG/ML IJ SOLN
INTRAMUSCULAR | Status: DC | PRN
  Administered 2023-03-31: 10 mg via INTRAVENOUS

## 2023-03-31 MED ORDER — PHENYLEPHRINE HCL-NACL 20-0.9 MG/250ML-% IV SOLN
INTRAVENOUS | Status: DC | PRN
  Administered 2023-03-31: 40 ug/min via INTRAVENOUS

## 2023-03-31 MED ORDER — LIDOCAINE 2% (20 MG/ML) 5 ML SYRINGE
INTRAMUSCULAR | Status: DC | PRN
  Administered 2023-03-31: 100 mg via INTRAVENOUS

## 2023-03-31 MED ORDER — ACETAMINOPHEN 10 MG/ML IV SOLN: 1000.0000 mg | Freq: Once | INTRAVENOUS | Status: DC | PRN

## 2023-03-31 MED ORDER — ALBUMIN HUMAN 5 % IV SOLN
12.5000 g | Freq: Once | INTRAVENOUS | Status: AC
  Administered 2023-03-31: 12.5 g via INTRAVENOUS

## 2023-03-31 MED ORDER — METHOCARBAMOL 500 MG PO TABS: 500.0000 mg | ORAL_TABLET | Freq: Four times a day (QID) | ORAL | Status: DC | PRN

## 2023-03-31 MED ORDER — ONDANSETRON HCL 4 MG/2ML IJ SOLN: 4.0000 mg | Freq: Four times a day (QID) | INTRAMUSCULAR | Status: DC | PRN

## 2023-03-31 MED ORDER — QUETIAPINE FUMARATE 100 MG PO TABS
600.0000 mg | ORAL_TABLET | Freq: Every day | ORAL | Status: DC
  Administered 2023-03-31 – 2023-04-01 (×2): 600 mg via ORAL
  Filled 2023-03-31 (×2): qty 6

## 2023-03-31 MED ORDER — ORAL CARE MOUTH RINSE: 15.0000 mL | Freq: Once | OROMUCOSAL | Status: AC

## 2023-03-31 MED ORDER — POLYVINYL ALCOHOL 1.4 % OP SOLN
1.0000 [drp] | OPHTHALMIC | Status: DC | PRN
  Administered 2023-03-31 – 2023-04-01 (×2): 1 [drp] via OPHTHALMIC
  Filled 2023-03-31: qty 15

## 2023-03-31 MED ORDER — ROCURONIUM BROMIDE 10 MG/ML (PF) SYRINGE
PREFILLED_SYRINGE | INTRAVENOUS | Status: DC | PRN
  Administered 2023-03-31: 50 mg via INTRAVENOUS

## 2023-03-31 MED ORDER — CEFAZOLIN SODIUM-DEXTROSE 2-4 GM/100ML-% IV SOLN
2.0000 g | Freq: Three times a day (TID) | INTRAVENOUS | Status: AC
  Administered 2023-03-31 – 2023-04-01 (×3): 2 g via INTRAVENOUS
  Filled 2023-03-31 (×3): qty 100

## 2023-03-31 MED ORDER — FENTANYL CITRATE (PF) 100 MCG/2ML IJ SOLN: 25.0000 ug | INTRAMUSCULAR | Status: DC | PRN

## 2023-03-31 MED ORDER — ONDANSETRON HCL 4 MG PO TABS
4.0000 mg | ORAL_TABLET | Freq: Four times a day (QID) | ORAL | Status: DC | PRN
  Filled 2023-03-31: qty 1

## 2023-03-31 MED ORDER — METHOCARBAMOL 1000 MG/10ML IJ SOLN: 500.0000 mg | Freq: Four times a day (QID) | INTRAMUSCULAR | Status: DC | PRN

## 2023-03-31 MED ORDER — DOCUSATE SODIUM 100 MG PO CAPS
100.0000 mg | ORAL_CAPSULE | Freq: Two times a day (BID) | ORAL | Status: DC
  Administered 2023-03-31 – 2023-04-08 (×12): 100 mg via ORAL
  Filled 2023-03-31 (×16): qty 1

## 2023-03-31 MED ORDER — VITAMIN D (ERGOCALCIFEROL) 1.25 MG (50000 UNIT) PO CAPS
50000.0000 [IU] | ORAL_CAPSULE | ORAL | Status: DC
  Administered 2023-04-06: 50000 [IU] via ORAL
  Filled 2023-03-31: qty 1

## 2023-03-31 MED ORDER — FENTANYL CITRATE (PF) 250 MCG/5ML IJ SOLN
INTRAMUSCULAR | Status: DC | PRN
  Administered 2023-03-31 (×2): 100 ug via INTRAVENOUS
  Administered 2023-03-31: 50 ug via INTRAVENOUS

## 2023-03-31 MED ORDER — CHLORHEXIDINE GLUCONATE 0.12 % MT SOLN
15.0000 mL | Freq: Once | OROMUCOSAL | Status: AC
  Administered 2023-03-31: 15 mL via OROMUCOSAL
  Filled 2023-03-31: qty 15

## 2023-03-31 MED ORDER — CEFAZOLIN SODIUM-DEXTROSE 2-4 GM/100ML-% IV SOLN
INTRAVENOUS | Status: AC
  Filled 2023-03-31: qty 100

## 2023-03-31 MED ORDER — FENTANYL CITRATE (PF) 250 MCG/5ML IJ SOLN
INTRAMUSCULAR | Status: AC
  Filled 2023-03-31: qty 5

## 2023-03-31 MED ORDER — ALBUMIN HUMAN 5 % IV SOLN: INTRAVENOUS | Status: DC | PRN

## 2023-03-31 MED ORDER — ALBUMIN HUMAN 5 % IV SOLN
INTRAVENOUS | Status: AC
  Filled 2023-03-31: qty 250

## 2023-03-31 MED ORDER — TRANEXAMIC ACID-NACL 1000-0.7 MG/100ML-% IV SOLN
1000.0000 mg | Freq: Once | INTRAVENOUS | Status: AC
  Administered 2023-03-31: 1000 mg via INTRAVENOUS
  Filled 2023-03-31: qty 100

## 2023-03-31 MED ORDER — ONDANSETRON HCL 4 MG/2ML IJ SOLN
INTRAMUSCULAR | Status: DC | PRN
  Administered 2023-03-31: 4 mg via INTRAVENOUS

## 2023-03-31 MED ORDER — ONDANSETRON HCL 4 MG/2ML IJ SOLN: 4.0000 mg | Freq: Once | INTRAMUSCULAR | Status: DC | PRN

## 2023-03-31 MED ORDER — VANCOMYCIN HCL 1000 MG IV SOLR
INTRAVENOUS | Status: DC | PRN
  Administered 2023-03-31: 1000 mg via TOPICAL

## 2023-03-31 MED ORDER — LACTATED RINGERS IV SOLN: INTRAVENOUS | Status: DC

## 2023-03-31 MED ORDER — FERROUS SULFATE 325 (65 FE) MG PO TABS
325.0000 mg | ORAL_TABLET | Freq: Every day | ORAL | Status: DC
  Administered 2023-04-01 – 2023-04-08 (×8): 325 mg via ORAL
  Filled 2023-03-31 (×9): qty 1

## 2023-03-31 MED ORDER — EPHEDRINE SULFATE-NACL 50-0.9 MG/10ML-% IV SOSY
PREFILLED_SYRINGE | INTRAVENOUS | Status: DC | PRN
  Administered 2023-03-31 (×3): 5 mg via INTRAVENOUS

## 2023-03-31 MED ORDER — PHENYLEPHRINE 80 MCG/ML (10ML) SYRINGE FOR IV PUSH (FOR BLOOD PRESSURE SUPPORT)
PREFILLED_SYRINGE | INTRAVENOUS | Status: DC | PRN
  Administered 2023-03-31: 80 ug via INTRAVENOUS
  Administered 2023-03-31: 120 ug via INTRAVENOUS
  Administered 2023-03-31: 80 ug via INTRAVENOUS

## 2023-03-31 MED ORDER — PANTOPRAZOLE SODIUM 40 MG PO TBEC
40.0000 mg | DELAYED_RELEASE_TABLET | Freq: Every day | ORAL | Status: DC
  Administered 2023-03-31 – 2023-04-08 (×9): 40 mg via ORAL
  Filled 2023-03-31 (×9): qty 1

## 2023-03-31 MED ORDER — DAPAGLIFLOZIN PROPANEDIOL 10 MG PO TABS
10.0000 mg | ORAL_TABLET | Freq: Every day | ORAL | Status: DC
  Administered 2023-04-01 – 2023-04-02 (×2): 10 mg via ORAL
  Filled 2023-03-31 (×3): qty 1

## 2023-03-31 MED ORDER — LACTATED RINGERS IV SOLN
INTRAVENOUS | Status: DC | PRN
Start: 2023-03-31 — End: 2023-03-31

## 2023-03-31 MED ORDER — CEFAZOLIN SODIUM 1 G IJ SOLR
INTRAMUSCULAR | Status: AC
  Filled 2023-03-31: qty 20

## 2023-03-31 MED ORDER — GABAPENTIN 300 MG PO CAPS
600.0000 mg | ORAL_CAPSULE | Freq: Three times a day (TID) | ORAL | Status: DC
  Administered 2023-03-31 – 2023-04-02 (×6): 600 mg via ORAL
  Filled 2023-03-31 (×6): qty 2

## 2023-03-31 MED ORDER — DULOXETINE HCL 60 MG PO CPEP
60.0000 mg | ORAL_CAPSULE | Freq: Every day | ORAL | Status: DC
Start: 2023-03-31 — End: 2023-04-03
  Administered 2023-04-01 – 2023-04-03 (×3): 60 mg via ORAL
  Filled 2023-03-31 (×4): qty 1
  Filled 2023-03-31: qty 2

## 2023-03-31 MED ORDER — AMISULPRIDE (ANTIEMETIC) 5 MG/2ML IV SOLN: 10.0000 mg | Freq: Once | INTRAVENOUS | Status: DC | PRN

## 2023-03-31 MED ORDER — SUGAMMADEX SODIUM 200 MG/2ML IV SOLN
INTRAVENOUS | Status: DC | PRN
  Administered 2023-03-31: 200 mg via INTRAVENOUS

## 2023-03-31 MED ORDER — PROPOFOL 10 MG/ML IV BOLUS
INTRAVENOUS | Status: DC | PRN
  Administered 2023-03-31: 150 mg via INTRAVENOUS

## 2023-03-31 SURGICAL SUPPLY — 66 items
BAG COUNTER SPONGE SURGICOUNT (BAG) ×2 IMPLANT
BIT DRILL INTERTAN LAG SCREW (BIT) IMPLANT
BIT DRILL LONG 4.0 (BIT) IMPLANT
BNDG COHESIVE 6X5 TAN ST LF (GAUZE/BANDAGES/DRESSINGS) ×2 IMPLANT
BNDG ELASTIC 4X5.8 VLCR STR LF (GAUZE/BANDAGES/DRESSINGS) ×2 IMPLANT
BRUSH SCRUB EZ PLAIN DRY (MISCELLANEOUS) ×4 IMPLANT
CHLORAPREP W/TINT 26 (MISCELLANEOUS) ×2 IMPLANT
COVER SURGICAL LIGHT HANDLE (MISCELLANEOUS) ×4 IMPLANT
CUFF TRNQT CYL 34X4.125X (TOURNIQUET CUFF) IMPLANT
DERMABOND ADVANCED .7 DNX12 (GAUZE/BANDAGES/DRESSINGS) ×2 IMPLANT
DRAPE C-ARM 35X43 STRL (DRAPES) ×2 IMPLANT
DRAPE C-ARM 42X72 X-RAY (DRAPES) IMPLANT
DRAPE C-ARMOR (DRAPES) ×2 IMPLANT
DRAPE IMP U-DRAPE 54X76 (DRAPES) ×4 IMPLANT
DRAPE INCISE IOBAN 66X45 STRL (DRAPES) ×2 IMPLANT
DRAPE STERI IOBAN 125X83 (DRAPES) ×2 IMPLANT
DRAPE SURG 17X23 STRL (DRAPES) ×4 IMPLANT
DRAPE SURG ORHT 6 SPLT 77X108 (DRAPES) IMPLANT
DRAPE U-SHAPE 47X51 STRL (DRAPES) ×2 IMPLANT
DRESSING MEPILEX FLEX 4X4 (GAUZE/BANDAGES/DRESSINGS) ×2 IMPLANT
DRILL BIT LONG 4.0 (BIT) ×3 IMPLANT
DRSG ADAPTIC 3X8 NADH LF (GAUZE/BANDAGES/DRESSINGS) ×2 IMPLANT
DRSG MEPILEX FLEX 4X4 (GAUZE/BANDAGES/DRESSINGS) ×4 IMPLANT
DRSG MEPILEX POST OP 4X12 (GAUZE/BANDAGES/DRESSINGS) IMPLANT
DRSG MEPILEX POST OP 4X8 (GAUZE/BANDAGES/DRESSINGS) ×2 IMPLANT
ELECT REM PT RETURN 9FT ADLT (ELECTROSURGICAL) ×1 IMPLANT
ELECTRODE REM PT RTRN 9FT ADLT (ELECTROSURGICAL) ×2 IMPLANT
GAUZE SPONGE 4X4 12PLY STRL (GAUZE/BANDAGES/DRESSINGS) ×2 IMPLANT
GLOVE BIO SURGEON STRL SZ 6.5 (GLOVE) ×6 IMPLANT
GLOVE BIO SURGEON STRL SZ7.5 (GLOVE) ×8 IMPLANT
GLOVE BIOGEL PI IND STRL 6.5 (GLOVE) ×2 IMPLANT
GLOVE BIOGEL PI IND STRL 7.5 (GLOVE) ×2 IMPLANT
GOWN STRL REUS W/ TWL LRG LVL3 (GOWN DISPOSABLE) ×4 IMPLANT
GUIDE PIN 3.2X343 (PIN) ×2 IMPLANT
GUIDEWIRE 3.2X400 (WIRE) IMPLANT
KIT BASIN OR (CUSTOM PROCEDURE TRAY) ×2 IMPLANT
KIT TURNOVER KIT B (KITS) ×2 IMPLANT
MANIFOLD NEPTUNE II (INSTRUMENTS) ×2 IMPLANT
NAIL INTERTAN 10X18 130D 10S (Nail) IMPLANT
NDL 22X1.5 STRL (OR ONLY) (MISCELLANEOUS) IMPLANT
NEEDLE 22X1.5 STRL (OR ONLY) (MISCELLANEOUS) IMPLANT
NS IRRIG 1000ML POUR BTL (IV SOLUTION) ×2 IMPLANT
PACK GENERAL/GYN (CUSTOM PROCEDURE TRAY) ×2 IMPLANT
PAD ARMBOARD 7.5X6 YLW CONV (MISCELLANEOUS) ×4 IMPLANT
PADDING CAST COTTON 6X4 STRL (CAST SUPPLIES) ×6 IMPLANT
PIN GUIDE 3.2X343MM (PIN) IMPLANT
SCREW LAG COMPR KIT 100/95 (Screw) IMPLANT
SCREW TRIGEN LOW PROF 5.0X37.5 (Screw) IMPLANT
SPONGE T-LAP 18X18 ~~LOC~~+RFID (SPONGE) ×2 IMPLANT
STAPLER VISISTAT 35W (STAPLE) IMPLANT
STOCKINETTE IMPERVIOUS LG (DRAPES) ×2 IMPLANT
STRIP CLOSURE SKIN 1/2X4 (GAUZE/BANDAGES/DRESSINGS) IMPLANT
SUCTION TUBE FRAZIER 10FR DISP (SUCTIONS) IMPLANT
SUT ETHILON 3 0 PS 1 (SUTURE) IMPLANT
SUT MNCRL AB 3-0 PS2 18 (SUTURE) ×2 IMPLANT
SUT MON AB 2-0 CT1 36 (SUTURE) ×2 IMPLANT
SUT PDS AB 2-0 CT1 27 (SUTURE) IMPLANT
SUT VIC AB 0 CT1 27XBRD ANBCTR (SUTURE) IMPLANT
SUT VIC AB 2-0 CT1 TAPERPNT 27 (SUTURE) ×4 IMPLANT
SYR CONTROL 10ML LL (SYRINGE) IMPLANT
TOWEL GREEN STERILE (TOWEL DISPOSABLE) ×4 IMPLANT
TOWEL GREEN STERILE FF (TOWEL DISPOSABLE) ×4 IMPLANT
TUBE CONNECTING 12X1/4 (SUCTIONS) ×2 IMPLANT
UNDERPAD 30X36 HEAVY ABSORB (UNDERPADS AND DIAPERS) ×2 IMPLANT
WATER STERILE IRR 1000ML POUR (IV SOLUTION) ×4 IMPLANT
YANKAUER SUCT BULB TIP NO VENT (SUCTIONS) ×2 IMPLANT

## 2023-03-31 NOTE — Anesthesia Procedure Notes (Signed)
 Procedure Name: Intubation Date/Time: 03/31/2023 10:22 AM  Performed by: Loreda Rodriguez, CRNAPre-anesthesia Checklist: Patient identified, Emergency Drugs available, Suction available and Patient being monitored Patient Re-evaluated:Patient Re-evaluated prior to induction Oxygen Delivery Method: Circle System Utilized Preoxygenation: Pre-oxygenation with 100% oxygen Induction Type: IV induction Ventilation: Mask ventilation without difficulty Laryngoscope Size: Mac and 4 Grade View: Grade I Tube type: Oral Tube size: 7.0 mm Number of attempts: 1 Airway Equipment and Method: Stylet and Oral airway Placement Confirmation: ETT inserted through vocal cords under direct vision, positive ETCO2 and breath sounds checked- equal and bilateral Secured at: 22 cm Tube secured with: Tape Dental Injury: Teeth and Oropharynx as per pre-operative assessment

## 2023-03-31 NOTE — Progress Notes (Signed)
 Pt back on unit from surgery VSS

## 2023-03-31 NOTE — Op Note (Signed)
 Orthopaedic Surgery Operative Note (CSN: [id number] ) Date of Surgery: 03/31/2023  Admit Date: 03/30/2023   Diagnoses: Pre-Op Diagnoses: Right periprosthetic intertrochanteric femur fracture  Post-Op Diagnosis: Same  Procedures: CPT 27245-Cephalomedullary nailing of right intertrochanteric femur fracture CPT 20680-Removal of hardware right femur  Surgeons : Primary: Laneta Pintos, MD  Assistant: Delberta Fee, PA-C  Location: OR 3   Anesthesia: General   Antibiotics: Ancef  2g preop   Tourniquet time: None    Estimated Blood Loss: 100 mL  Complications:* No complications entered in OR log *   Specimens:* No specimens in log *   Implants: Implant Name Type Inv. Item Serial No. Manufacturer Lot No. LRB No. Used Action  NAIL INTERTAN 10X18 130D 10S - [id number] Nail NAIL INTERTAN 10X18 130D 10S  [employer] [id number] Right 1 Implanted  SCREW LAG COMPR KIT 100/95 - [id number] Screw SCREW LAG COMPR KIT 100/95  [employer] [id number] Right 1 Implanted     Indications for Surgery: 83 year old male who sustained a right intertrochanteric femur fracture above a previous retrograde intramedullary nail for a distal femur fracture that has healed.  Due to the unstable nature of his injury I recommend proceeding with removal of previous intramedullary nail and cephalomedullary nailing of the right femur.  Risks and benefits were discussed with the patient.  Risks included but not limited to bleeding, infection, malunion, nonunion, hardware failure, hardware irritation, nerve and blood vessel injury, DVT, even the possibility anesthetic complications.  He agreed to proceed with surgery and consent was obtained  Operative Findings: 1.  Removal of previous retrograde intramedullary nail without difficulty 2.  Cephalomedullary nailing of right intertrochanteric femur fracture using [employer] InterTAN 10 x 180 mm nail with a 100 mm lag screw and 95 mm  compression screw.  Procedure: The patient was identified in the preoperative holding area. Consent was confirmed with the patient and their family and all questions were answered. The operative extremity was marked after confirmation with the patient. he was then brought back to the operating room by our anesthesia colleagues.  He was placed under general anesthetic and carefully transferred over to radiolucent flattop table.  A bump was placed under his operative hip.  The right lower extremity was then prepped and draped in usual sterile fashion.  A timeout was performed to verify the patient, the procedure, and the extremity.  Preoperative antibiotics were dosed.  Fluoroscopic imaging showed the unstable nature of his injury.  For started out by removing the distal interlocking screws from the previous percutaneous incisions.  I then reopened the medial parapatellar incision and directed the extraction bolt into the distal portion of the nail and threaded this in place.  Once I had good purchase I then went to the proximal interlocking screws and remove these without difficulty.  The nail was then extracted without any difficulty.  The hip and knee were flexed over a triangle and traction was applied by my assistant to align the intertrochanteric femur fracture.  A small incision proximal to the greater trochanter was made and carried down through skin and subcutaneous tissue.  I then directed a threaded guidewire at the tip of the greater trochanter and advanced it into the proximal metaphysis.  I then used an entry reamer to enter the medullary canal.  I then placed a 10 x 180 mm nail down the center of the canal attached to a targeting arm.  I directed a threaded guidewire into the head/neck segment.  I confirmed adequate tip apex distance and measured the length.  I decided to use 100 mm screw.  I drilled the path for the compression screw and placed an antirotation bar.  I drilled the path for the  lag screw and placed the lag screw.  I then placed the compression screw and compressed approximately 5 mm.  The nail was statically locked proximally.  I then used the targeting arm to place a lateral to medial distal interlocking screw.  The targeting arm was removed and final fluoroscopic imaging was obtained.  The incisions were irrigated and closed with 2-0 Monocryl and 3-0 Monocryl.  Dermabond was used to seal the skin.  Sterile dressings were applied.  The patient was then awoke from anesthesia and taken to the PACU in stable condition.  Post Op Plan/Instructions: The patient will be weightbearing as tolerated to the right lower extremity.  He will receive postoperative Ancef .  He will be placed on his Eliquis  postoperatively if his hemoglobin is stable.  We will have him mobilize with physical and Occupational Therapy.  I was present and performed the entire surgery.  Delberta Fee, PA-C did assist me throughout the case. An assistant was necessary given the difficulty in approach, maintenance of reduction and ability to instrument the fracture.   Katheryne Pane, MD [facility]

## 2023-03-31 NOTE — Transfer of Care (Signed)
 Immediate Anesthesia Transfer of Care Note  Patient: Dustin [name]  Procedure(s) Performed: HARDWARE REMOVAL (Right) INTRAMEDULLARY (IM) NAIL INTERTROCHANTERIC (Right)  Patient Location: PACU  Anesthesia Type:General  Level of Consciousness: awake, alert , and oriented  Airway & Oxygen Therapy: Patient Spontanous Breathing and Patient connected to nasal cannula oxygen  Post-op Assessment: Report given to RN and Post -op Vital signs reviewed and stable  Post vital signs: Reviewed and stable  Last Vitals:  Vitals Value Taken Time  BP 101/61 03/31/23 1200  Temp 36.7 C 03/31/23 1150  Pulse 106 03/31/23 1203  Resp 12 03/31/23 1203  SpO2 91 % 03/31/23 1203  Vitals shown include unfiled device data.  Last Pain:  Vitals:   03/31/23 0853  TempSrc: Oral  PainSc: 7       Patients Stated Pain Goal: 0 (03/31/23 0813)  Complications: No notable events documented.

## 2023-03-31 NOTE — Progress Notes (Signed)
 Pt off unit taken to surgery

## 2023-03-31 NOTE — Interval H&P Note (Signed)
 History and Physical Interval Note:  03/31/2023 9:18 AM  Dustin [name]  has presented today for surgery, with the diagnosis of Right intertrochanteric femur fracture.  The various methods of treatment have been discussed with the patient and family. After consideration of risks, benefits and other options for treatment, the patient has consented to  Procedure(s): HARDWARE REMOVAL (Right) INTRAMEDULLARY (IM) NAIL INTERTROCHANTERIC (Right) as a surgical intervention.  The patient's history has been reviewed, patient examined, no change in status, stable for surgery.  I have reviewed the patient's chart and labs.  Questions were answered to the patient's satisfaction.     Hailea Eaglin P Jonmarc Bodkin

## 2023-03-31 NOTE — Progress Notes (Signed)
 Transition of Care [facility]) - CAGE-AID Screening   Patient Details  Name: Dustin [name] MRN: [id number] Date of Birth: 17-Oct-1940  Transition of [facility]) CM/SW Contact:    Marvis Sluder, RN Phone Number: 03/31/2023, 7:45 PM   Clinical Narrative:  Reports 2 drinks ETOH on weekends, declines resources  CAGE-AID Screening:    Have You Ever Felt You Ought to Cut Down on Your Drinking or Drug Use?: No Have People Annoyed You By Critizing Your Drinking Or Drug Use?: No Have You Felt Bad Or Guilty About Your Drinking Or Drug Use?: No Have You Ever Had a Drink or Used Drugs First Thing In The Morning to Steady Your Nerves or to Get Rid of a Hangover?: No CAGE-AID Score: 0  Substance Abuse Education Offered: No

## 2023-03-31 NOTE — Plan of Care (Signed)
  Problem: Education: Goal: Knowledge of General Education information will improve Description: Including pain rating scale, medication(s)/side effects and non-pharmacologic comfort measures Outcome: Not Progressing   Problem: Clinical Measurements: Goal: Ability to maintain clinical measurements within normal limits will improve Outcome: Progressing   Problem: Coping: Goal: Level of anxiety will decrease Outcome: Not Progressing   Problem: Elimination: Goal: Will not experience complications related to bowel motility Outcome: Progressing   Problem: Pain Managment: Goal: General experience of comfort will improve and/or be controlled Outcome: Progressing

## 2023-03-31 NOTE — Anesthesia Preprocedure Evaluation (Signed)
 Anesthesia Evaluation  Patient identified by MRN, date of birth, ID band Patient awake    Reviewed: Allergy & Precautions, NPO status , Patient's Chart, lab work & pertinent test results  Airway Mallampati: III  TM Distance: >3 FB Neck ROM: Limited    Dental no notable dental hx.    Pulmonary COPD, former smoker   Pulmonary exam normal        Cardiovascular +CHF  Normal cardiovascular exam+ dysrhythmias Atrial Fibrillation   ECHO: 1. Left ventricular ejection fraction, by estimation, is 50 to 55%. The  left ventricle has low normal function. The left ventricle has no regional  wall motion abnormalities. Left ventricular diastolic parameters were  normal.   2. Right ventricular systolic function is mildly reduced. The right  ventricular size is normal. There is moderately elevated pulmonary artery  systolic pressure. The estimated right ventricular systolic pressure is  46.9 mmHg.   3. Left atrial size was mildly dilated.   4. Right atrial size was mild to moderately dilated.   5. The mitral valve is normal in structure. Trivial mitral valve  regurgitation. No evidence of mitral stenosis.   6. The aortic valve is normal in structure. Aortic valve regurgitation is  trivial. No aortic stenosis is present.   7. The aortic root measurement of 4cm is normal when indexed for BSA.   8. The inferior vena cava is dilated in size with >50% respiratory  variability, suggesting right atrial pressure of 8 mmHg.     Neuro/Psych  Headaches PSYCHIATRIC DISORDERS  Depression     Neuromuscular disease    GI/Hepatic Neg liver ROS,GERD  Medicated and Controlled,,  Endo/Other  negative endocrine ROS    Renal/GU negative Renal ROS     Musculoskeletal negative musculoskeletal ROS (+)    Abdominal   Peds  Hematology  (+) Blood dyscrasia (Eliquis ), anemia Thrombocytopenia, 93K INR: 1.3   Anesthesia Other Findings Right  intertrochanteric femur fracture  Reproductive/Obstetrics                             Anesthesia Physical Anesthesia Plan  ASA: 3  Anesthesia Plan: General   Post-op Pain Management:    Induction: Intravenous  PONV Risk Score and Plan: 2 and Ondansetron , Dexamethasone  and Treatment may vary due to age or medical condition  Airway Management Planned: Oral ETT  Additional Equipment:   Intra-op Plan:   Post-operative Plan: Extubation in OR  Informed Consent: I have reviewed the patients History and Physical, chart, labs and discussed the procedure including the risks, benefits and alternatives for the proposed anesthesia with the patient or authorized representative who has indicated his/her understanding and acceptance.   Patient has DNR.  Discussed DNR with patient and Suspend DNR.   Dental advisory given  Plan Discussed with: CRNA  Anesthesia Plan Comments:        Anesthesia Quick Evaluation

## 2023-03-31 NOTE — Consult Note (Signed)
 Orthopaedic Trauma Service (OTS) Consult   Patient ID: Dustin [name] MRN: [id number] DOB/AGE: [birth year] 83 y.o.  Reason for Consult:Right periprosthetic proximal femur fracture Referring Physician: Dr. Virl Grimes, MD [name] Other  HPI: Dustin [name] is an 83 y.o. male who is being seen in consultation at the request of Dr. Jackee Marus for evaluation of right proximal periprosthetic femur fracture.  Patient had a retrograde intramedullary nail by myself in 2022 and was doing okay he had a fall sustaining a right intertrochanteric femur fracture above his previous fixation.  Due to complexity did call Dr. Jackee Marus who is on-call and he requested that I manage his fracture.  Patient had his last dose of Eliquis  yesterday morning.  He is in quite a bit of pain.  Denies any other injuries.  He was ambulatory prior to this recent fall.  Past Medical History:  Diagnosis Date   BPH (benign prostatic hyperplasia)    Chronic combined systolic and diastolic congestive heart failure (HCC)    GERD (gastroesophageal reflux disease)    protonix  for control   Insomnia    Neck pain    Neuropathy     Past Surgical History:  Procedure Laterality Date   ANTERIOR CERVICAL DECOMPRESSION/DISCECTOMY FUSION 4 LEVELS N/A 10/27/2012   Procedure: Cervical Three-Four Cervical Four-Five Cervical Five-Six Cervical Six-Seven  Anterior cervical decompression/diskectomy/fusion;  Surgeon: Manya Sells, MD;  Location: [facility]S;  Service: Neurosurgery;  Laterality: N/A;  Cervical Three-Four Cervical Four-Five Cervical Five-Six Cervical Six-Seven  Anterior cervical decompression/diskectomy/fusion   CARDIOVERSION N/A 09/20/2020   Procedure: CARDIOVERSION;  Surgeon: Wendie Hamburg, MD;  Location: [facility] ENDOSCOPY;  Service: Cardiovascular;  Laterality: N/A;   FEMUR IM NAIL Right 03/08/2020   Procedure: INTRAMEDULLARY (IM) RETROGRADE FEMORAL NAILING;  Surgeon: Laneta Pintos, MD;  Location: [facility] OR;  Service:  Orthopedics;  Laterality: Right;   SHOULDER ARTHROSCOPY  2010   rt shoulder   SHOULDER ARTHROSCOPY  02/15/2011   Procedure: ARTHROSCOPY SHOULDER;  Surgeon: Valdene Garret;  Location: [facility];  Service: Orthopedics;  Laterality: Right;  Debridement   TONSILLECTOMY      Family History  Problem Relation Age of Onset   Emphysema Mother    Cirrhosis Father    Stroke Brother     Social History:  reports that he quit smoking about 33 years ago. His smoking use included cigarettes. He has never used smokeless tobacco. He reports that he does not drink alcohol  and does not use drugs.  Allergies:  Allergies  Allergen Reactions   Desmopressin Acetate Other (See Comments)    Hyponatremia    Zoloft  [Sertraline ] Other (See Comments)    Felt bad - Patient denies    Medications:  No current facility-administered medications on file prior to encounter.   Current Outpatient Medications on File Prior to Encounter  Medication Sig Dispense Refill   apixaban  (ELIQUIS ) 5 MG TABS tablet Take 5 mg by mouth 2 (two) times daily.     DENTA 5000 PLUS 1.1 % CREA dental cream Place 1 Application onto teeth in the morning and at bedtime.     DULoxetine  (CYMBALTA ) 60 MG capsule Take 60 mg by mouth daily.     FARXIGA  10 MG TABS tablet Take 10 mg by mouth daily.     ferrous sulfate  325 (65 FE) MG tablet Take 1 tablet (325 mg total) by mouth daily with breakfast. 30 tablet 0   fluorouracil (EFUDEX) 5 % cream Apply 1 Application topically 2 (two) times daily.  For left ear     gabapentin  (NEURONTIN ) 300 MG capsule Take 1 capsule (300 mg total) by mouth 3 (three) times daily. (Patient taking differently: Take 600 mg by mouth 3 (three) times daily.)     ibuprofen (ADVIL) 200 MG tablet Take 400 mg by mouth as needed for moderate pain.     metoprolol  tartrate (LOPRESSOR ) 25 MG tablet Take 25 mg by mouth 2 (two) times daily.     Multiple Vitamin (MULTIVITAMIN WITH MINERALS) TABS tablet Take 1  tablet by mouth daily. 30 tablet    Oxycodone  HCl 10 MG TABS Take 10 mg by mouth 2 (two) times daily as needed (severe leg pain).     pantoprazole  (PROTONIX ) 40 MG tablet Take 1 tablet (40 mg total) by mouth daily. 30 tablet    QUEtiapine  (SEROQUEL ) 300 MG tablet Take 600 mg by mouth at bedtime.     silodosin (RAPAFLO) 8 MG CAPS capsule Take 8 mg by mouth daily.     Vitamin D , Ergocalciferol , (DRISDOL ) 1.25 MG (50000 UNIT) CAPS capsule Take 50,000 Units by mouth once a week. Thursdays       ROS: Constitutional: No fever or chills Vision: No changes in vision ENT: No difficulty swallowing CV: No chest pain Pulm: No SOB or wheezing GI: No nausea or vomiting GU: No urgency or inability to hold urine Skin: No poor wound healing Neurologic: No numbness or tingling Psychiatric: No depression or anxiety Heme: No bruising Allergic: No reaction to medications or food   Exam: Blood pressure 133/77, pulse 97, temperature 98.3 F (36.8 C), temperature source Oral, resp. rate 17, height 6\' 2"  (1.88 m), weight 95.3 kg, SpO2 90%. General: No acute distress Orientation: Awake alert and oriented x 3 Mood and Affect: Cooperative and pleasant Gait: Unable to assess due to his fracture Coordination and balance: Within normal limits  Right lower extremity: Leg is shortened and externally rotated.  Pain with any attempted range of motion.  Previous surgical incisions are well-healed.  Compartments are soft and compressible.  Intact dorsiflexion/plantarflexion.  Sensation is intact to light touch.  No deformity throughout the rest of the lower extremity.  Left lower extremity: Skin without lesions. No tenderness to palpation. Full painless ROM, full strength in each muscle groups without evidence of instability.   Medical Decision Making: Data: Imaging: X-rays have been reviewed which shows a comminuted right intertrochanteric femur fracture above previous retrograde intramedullary nail.  Labs:   Results for orders placed or performed during the hospital encounter of 03/30/23 (from the past 24 hours)  CBC with Differential     Status: Abnormal   Collection Time: 03/30/23 10:34 AM  Result Value Ref Range   WBC 5.0 4.0 - 10.5 K/uL   RBC 3.71 (L) 4.22 - 5.81 MIL/uL   Hemoglobin 11.9 (L) 13.0 - 17.0 g/dL   HCT 95.6 (L) 21.3 - 08.6 %   MCV 97.0 80.0 - 100.0 fL   MCH 32.1 26.0 - 34.0 pg   MCHC 33.1 30.0 - 36.0 g/dL   RDW 57.8 46.9 - 62.9 %   Platelets 94 (L) 150 - 400 K/uL   nRBC 0.0 0.0 - 0.2 %   Neutrophils Relative % 70 %   Neutro Abs 3.5 1.7 - 7.7 K/uL   Lymphocytes Relative 19 %   Lymphs Abs 0.9 0.7 - 4.0 K/uL   Monocytes Relative 7 %   Monocytes Absolute 0.3 0.1 - 1.0 K/uL   Eosinophils Relative 3 %   Eosinophils Absolute 0.2  0.0 - 0.5 K/uL   Basophils Relative 0 %   Basophils Absolute 0.0 0.0 - 0.1 K/uL   Immature Granulocytes 1 %   Abs Immature Granulocytes 0.06 0.00 - 0.07 K/uL  Protime-INR     Status: Abnormal   Collection Time: 03/30/23 10:34 AM  Result Value Ref Range   Prothrombin Time 18.6 (H) 11.4 - 15.2 seconds   INR 1.5 (H) 0.8 - 1.2  Comprehensive metabolic panel     Status: Abnormal   Collection Time: 03/30/23 10:34 AM  Result Value Ref Range   Sodium 141 135 - 145 mmol/L   Potassium 4.0 3.5 - 5.1 mmol/L   Chloride 105 98 - 111 mmol/L   CO2 27 22 - 32 mmol/L   Glucose, Bld 111 (H) 70 - 99 mg/dL   BUN 14 8 - 23 mg/dL   Creatinine, Ser 5.78 (H) 0.61 - 1.24 mg/dL   Calcium 8.7 (L) 8.9 - 10.3 mg/dL   Total Protein 6.5 6.5 - 8.1 g/dL   Albumin  3.5 3.5 - 5.0 g/dL   AST 29 15 - 41 U/L   ALT 16 0 - 44 U/L   Alkaline Phosphatase 89 38 - 126 U/L   Total Bilirubin 0.9 0.0 - 1.2 mg/dL   GFR, Estimated 46 (L) >60 mL/min   Anion gap 9 5 - 15  Immature Platelet Fraction     Status: None   Collection Time: 03/30/23 10:34 AM  Result Value Ref Range   Immature Platelet Fraction 5.0 1.2 - 8.6 %  CBC     Status: Abnormal   Collection Time: 03/30/23  5:42 PM   Result Value Ref Range   WBC 9.0 4.0 - 10.5 K/uL   RBC 4.04 (L) 4.22 - 5.81 MIL/uL   Hemoglobin 12.9 (L) 13.0 - 17.0 g/dL   HCT 46.9 (L) 62.9 - 52.8 %   MCV 96.3 80.0 - 100.0 fL   MCH 31.9 26.0 - 34.0 pg   MCHC 33.2 30.0 - 36.0 g/dL   RDW 41.3 24.4 - 01.0 %   Platelets 98 (L) 150 - 400 K/uL   nRBC 0.0 0.0 - 0.2 %  Creatinine, serum     Status: Abnormal   Collection Time: 03/30/23  5:42 PM  Result Value Ref Range   Creatinine, Ser 1.56 (H) 0.61 - 1.24 mg/dL   GFR, Estimated 44 (L) >60 mL/min  Surgical PCR screen     Status: None   Collection Time: 03/31/23  5:00 AM   Specimen: Nasal Mucosa; Nasal Swab  Result Value Ref Range   MRSA, PCR NEGATIVE NEGATIVE   Staphylococcus aureus NEGATIVE NEGATIVE  Protime-INR     Status: Abnormal   Collection Time: 03/31/23  5:29 AM  Result Value Ref Range   Prothrombin Time 16.2 (H) 11.4 - 15.2 seconds   INR 1.3 (H) 0.8 - 1.2  APTT     Status: Abnormal   Collection Time: 03/31/23  5:29 AM  Result Value Ref Range   aPTT 57 (H) 24 - 36 seconds  Basic metabolic panel     Status: Abnormal   Collection Time: 03/31/23  5:29 AM  Result Value Ref Range   Sodium 137 135 - 145 mmol/L   Potassium 3.7 3.5 - 5.1 mmol/L   Chloride 97 (L) 98 - 111 mmol/L   CO2 23 22 - 32 mmol/L   Glucose, Bld 101 (H) 70 - 99 mg/dL   BUN 15 8 - 23 mg/dL   Creatinine, Ser 2.72 (H)  0.61 - 1.24 mg/dL   Calcium 8.9 8.9 - 78.2 mg/dL   GFR, Estimated 48 (L) >60 mL/min   Anion gap 17 (H) 5 - 15  CBC     Status: Abnormal   Collection Time: 03/31/23  5:29 AM  Result Value Ref Range   WBC 9.2 4.0 - 10.5 K/uL   RBC 3.90 (L) 4.22 - 5.81 MIL/uL   Hemoglobin 12.6 (L) 13.0 - 17.0 g/dL   HCT 95.6 (L) 21.3 - 08.6 %   MCV 95.1 80.0 - 100.0 fL   MCH 32.3 26.0 - 34.0 pg   MCHC 34.0 30.0 - 36.0 g/dL   RDW 57.8 46.9 - 62.9 %   Platelets 93 (L) 150 - 400 K/uL   nRBC 0.0 0.0 - 0.2 %     Imaging or Labs ordered: None  Medical history and chart was reviewed and case discussed  with medical provider.  Assessment/Plan: 83 year old male with a right intertrochanteric femur fracture over previous retrograde intramedullary nail.  Due to the unstable nature of his injury I recommend proceeding with removal of hardware with cephalomedullary nailing of his right hip.  Risks and benefits were discussed with the patient and his family.  Risks include but not limited to bleeding, infection, malunion, nonunion, hardware failure, hardware irritation, nerve and blood vessel injury, DVT, even the possibility anesthetic complications.  They agreed to proceed with surgery and consent was obtained.  Laneta Pintos, MD [facility] [phone] (office) [email or web address]

## 2023-03-31 NOTE — Progress Notes (Signed)
 PROGRESS NOTE    Dustin [name]  WJX:[id number] DOB: [birth year] DOA: 03/30/2023 PCP: Lanae Pinal, MD    Brief Narrative:  83 year old with history of chronic A-fib on Eliquis , history of hip fracture with IM nail presented with mechanical fall and severe pain right thigh area.  In the emergency room he was found to have right intertrochanteric femoral neck fracture periprosthetic.  Admitted with surgical consultation.  Subjective: Patient came back from procedure.  Seen and examined.  Denies any complaints.  Pain is controlled.  He is motivated to walk with therapies.   Assessment & Plan:   Closed traumatic intertrochanteric and periprosthetic fracture right femur: Status post ORIF Dr. Curtiss Dowdy 2/10 Weightbearing as tolerated Eliquis  on hold, probably can go back on it tomorrow. Adequate pain medications.  PT OT and postoperative management as per surgery.  Persistent A-fib: Mostly chronic.  On Eliquis .  Rate controlled.  Resume metoprolol   Chronic medical issues including Anxiety depression, will resume Cymbalta , Seroquel  and gabapentin .    DVT prophylaxis: SCDs Start: 03/31/23 1343 enoxaparin  (LOVENOX ) injection 40 mg Start: 03/31/23 1000 SCDs Start: 03/30/23 1309   Code Status: DNR with limited intervention Family Communication: None at bedside Disposition Plan: Status is: Inpatient Remains inpatient appropriate because: Immediate postop     Consultants:  Orthopedics  Procedures:  ORIF right hip  Antimicrobials:  Perioperative     Objective: Vitals:   03/31/23 1245 03/31/23 1300 03/31/23 1315 03/31/23 1344  BP: 110/69 102/61 100/60 116/71  Pulse: (!) 108 (!) 110 (!) 110   Resp: 18 18 14 17   Temp:   97.9 F (36.6 C) 98.1 F (36.7 C)  TempSrc:      SpO2: 93% 93% 96% 90%  Weight:      Height:        Intake/Output Summary (Last 24 hours) at 03/31/2023 1404 Last data filed at 03/31/2023 1203 Gross per 24 hour  Intake 850 ml  Output 850 ml  Net 0 ml    Filed Weights   03/30/23 1017  Weight: 95.3 kg    Examination:  General: Looks fairly comfortable. Cardiovascular: S1-S2 normal.  Regular rate rhythm. Respiratory: Bilateral clear.  No added sounds. Patient has ecchymosis on his left side of the chest from previous  injury.  Nontender. Gastrointestinal: Soft.  Nontender bowel sound present. Ext: Right lateral thigh incision clean and dry.  Edematous.  Edema extending to the knees.  Distal neurovascular status intact. Neuro: Alert awake and oriented.  Pleasant interaction.      Data Reviewed: I have personally reviewed following labs and imaging studies  CBC: Recent Labs  Lab 03/30/23 1034 03/30/23 1742 03/31/23 0529  WBC 5.0 9.0 9.2  NEUTROABS 3.5  --   --   HGB 11.9* 12.9* 12.6*  HCT 36.0* 38.9* 37.1*  MCV 97.0 96.3 95.1  PLT 94* 98* 93*   Basic Metabolic Panel: Recent Labs  Lab 03/30/23 1034 03/30/23 1742 03/31/23 0529  NA 141  --  137  K 4.0  --  3.7  CL 105  --  97*  CO2 27  --  23  GLUCOSE 111*  --  101*  BUN 14  --  15  CREATININE 1.51* 1.56* 1.45*  CALCIUM 8.7*  --  8.9   GFR: Estimated Creatinine Clearance: 46.5 mL/min (A) (by C-G formula based on SCr of 1.45 mg/dL (H)). Liver Function Tests: Recent Labs  Lab 03/30/23 1034  AST 29  ALT 16  ALKPHOS 89  BILITOT 0.9  PROT 6.5  ALBUMIN  3.5   No results for input(s): "LIPASE", "AMYLASE" in the last 168 hours. No results for input(s): "AMMONIA" in the last 168 hours. Coagulation Profile: Recent Labs  Lab 03/30/23 1034 03/31/23 0529  INR 1.5* 1.3*   Cardiac Enzymes: No results for input(s): "CKTOTAL", "CKMB", "CKMBINDEX", "TROPONINI" in the last 168 hours. BNP (last 3 results) No results for input(s): "PROBNP" in the last 8760 hours. HbA1C: No results for input(s): "HGBA1C" in the last 72 hours. CBG: No results for input(s): "GLUCAP" in the last 168 hours. Lipid Profile: No results for input(s): "CHOL", "HDL", "LDLCALC", "TRIG",  "CHOLHDL", "LDLDIRECT" in the last 72 hours. Thyroid  Function Tests: No results for input(s): "TSH", "T4TOTAL", "FREET4", "T3FREE", "THYROIDAB" in the last 72 hours. Anemia Panel: No results for input(s): "VITAMINB12", "FOLATE", "FERRITIN", "TIBC", "IRON ", "RETICCTPCT" in the last 72 hours. Sepsis Labs: No results for input(s): "PROCALCITON", "LATICACIDVEN" in the last 168 hours.  Recent Results (from the past 240 hours)  Surgical PCR screen     Status: None   Collection Time: 03/31/23  5:00 AM   Specimen: Nasal Mucosa; Nasal Swab  Result Value Ref Range Status   MRSA, PCR NEGATIVE NEGATIVE Final   Staphylococcus aureus NEGATIVE NEGATIVE Final    Comment: (NOTE) The Xpert SA Assay (FDA approved for NASAL specimens in patients 40 years of age and older), is one component of a comprehensive surveillance program. It is not intended to diagnose infection nor to guide or monitor treatment. Performed at [facility], [address], [place], [place] [address]          Radiology Studies: DG FEMUR, [place] 2 VIEWS RIGHT Result Date: 03/31/2023 CLINICAL DATA:  Elective surgery. Right femur hardware removal and new femur fixation. EXAM: RIGHT FEMUR 2 VIEWS COMPARISON:  Hip and knee radiograph 03/30/2023 FINDINGS: Eight fluoroscopic spot views of the right femur submitted from the operating room. The previous femoral intramedullary nail in fixating screws has been removed. Subsequent short proximal femoral intramedullary nail with trans trochanteric and distal locking screw fixation traversing intertrochanteric femur fracture. Fluoroscopy time 1:12 0.7 seconds. Dose 18.81 mGy IMPRESSION: Procedural fluoroscopy during proximal femur fracture fixation and removal of previous femur hardware. Electronically Signed   By: Chadwick Colonel M.D.   On: 03/31/2023 12:25   DG C-Arm 1-60 Min-No Report Result Date: 03/31/2023 Fluoroscopy was utilized by the requesting physician.  No radiographic  interpretation.   DG Chest 1 View Result Date: 03/30/2023 CLINICAL DATA:  [id number] Preoperative cardiovascular examination [id number], right hip fracture EXAM: CHEST  1 VIEW COMPARISON:  11/12/2022 chest radiograph. FINDINGS: Partially visualized surgical hardware from ACDF. Stable cardiomediastinal silhouette with mild cardiomegaly. No pneumothorax. No pleural effusion. Lungs appear clear, with no acute consolidative airspace disease and no pulmonary edema. IMPRESSION: Mild cardiomegaly without pulmonary edema. No active pulmonary disease. Electronically Signed   By: Levell Reach M.D.   On: 03/30/2023 12:51   DG Hip Unilat W or Wo Pelvis 2-3 Views Right Result Date: 03/30/2023 CLINICAL DATA:  Right knee and hip pain status post fall EXAM: RIGHT KNEE - COMPLETE 4+ VIEW; DG HIP (WITH OR WITHOUT PELVIS) 2-3V RIGHT COMPARISON:  Right femur radiographs 03/08/2020 FINDINGS: Right hip: Comminuted, mildly displaced right intertrochanteric femur fracture. Visualized portions of the right femur intramedullary rod are intact. The fracture does not clearly extend to the intramedullary rod, however this should be confirmed on CT. Multiple surgical clips in the midline pelvis likely due to prior prostatectomy. Right knee: No fracture or dislocation. Distal  segment of femoral intramedullary rod with interlocking screws are intact without periprosthetic fracture or lucency. IMPRESSION: 1. Comminuted, mildly displaced right intertrochanteric femur fracture. 2. Visualized portions of the femoral intramedullary nail are intact. The proximal femur fracture does not definitively extend to the intramedullary rod, however if confirmation is needed, CT should be performed. 3. No acute abnormality of the right knee. Electronically Signed   By: Elester Grim M.D.   On: 03/30/2023 11:24   DG Knee Complete 4 Views Right Result Date: 03/30/2023 CLINICAL DATA:  Right knee and hip pain status post fall EXAM: RIGHT KNEE - COMPLETE 4+ VIEW; DG HIP  (WITH OR WITHOUT PELVIS) 2-3V RIGHT COMPARISON:  Right femur radiographs 03/08/2020 FINDINGS: Right hip: Comminuted, mildly displaced right intertrochanteric femur fracture. Visualized portions of the right femur intramedullary rod are intact. The fracture does not clearly extend to the intramedullary rod, however this should be confirmed on CT. Multiple surgical clips in the midline pelvis likely due to prior prostatectomy. Right knee: No fracture or dislocation. Distal segment of femoral intramedullary rod with interlocking screws are intact without periprosthetic fracture or lucency. IMPRESSION: 1. Comminuted, mildly displaced right intertrochanteric femur fracture. 2. Visualized portions of the femoral intramedullary nail are intact. The proximal femur fracture does not definitively extend to the intramedullary rod, however if confirmation is needed, CT should be performed. 3. No acute abnormality of the right knee. Electronically Signed   By: Elester Grim M.D.   On: 03/30/2023 11:24        Scheduled Meds:  acetaminophen   650 mg Oral Q4H   docusate sodium   100 mg Oral BID   enoxaparin  (LOVENOX ) injection  40 mg Subcutaneous Q24H   metoprolol  tartrate  25 mg Oral BID   mupirocin  ointment  1 Application Nasal BID   oxyCODONE   10 mg Oral Q12H   senna-docusate  2 tablet Oral BID   sodium chloride  flush  3 mL Intravenous Q12H   Continuous Infusions:  sodium chloride      albumin  human     ceFAZolin       ceFAZolin  (ANCEF ) IV     tranexamic acid        LOS: 1 day    Time spent: 40 minutes     Vada Garibaldi, MD [facility]

## 2023-03-31 NOTE — H&P (View-Only) (Signed)
 Orthopaedic Trauma Service (OTS) Consult   Patient ID: Dustin [name] MRN: [id number] DOB/AGE: [birth year] 83 y.o.  Reason for Consult:Right periprosthetic proximal femur fracture Referring Physician: Dr. Virl Grimes, MD [name] Other  HPI: Dustin [name] is an 83 y.o. male who is being seen in consultation at the request of Dr. Jackee Marus for evaluation of right proximal periprosthetic femur fracture.  Patient had a retrograde intramedullary nail by myself in 2022 and was doing okay he had a fall sustaining a right intertrochanteric femur fracture above his previous fixation.  Due to complexity did call Dr. Jackee Marus who is on-call and he requested that I manage his fracture.  Patient had his last dose of Eliquis  yesterday morning.  He is in quite a bit of pain.  Denies any other injuries.  He was ambulatory prior to this recent fall.  Past Medical History:  Diagnosis Date   BPH (benign prostatic hyperplasia)    Chronic combined systolic and diastolic congestive heart failure (HCC)    GERD (gastroesophageal reflux disease)    protonix  for control   Insomnia    Neck pain    Neuropathy     Past Surgical History:  Procedure Laterality Date   ANTERIOR CERVICAL DECOMPRESSION/DISCECTOMY FUSION 4 LEVELS N/A 10/27/2012   Procedure: Cervical Three-Four Cervical Four-Five Cervical Five-Six Cervical Six-Seven  Anterior cervical decompression/diskectomy/fusion;  Surgeon: Manya Sells, MD;  Location: [facility] NEURO ORS;  Service: Neurosurgery;  Laterality: N/A;  Cervical Three-Four Cervical Four-Five Cervical Five-Six Cervical Six-Seven  Anterior cervical decompression/diskectomy/fusion   CARDIOVERSION N/A 09/20/2020   Procedure: CARDIOVERSION;  Surgeon: Wendie Hamburg, MD;  Location: [facility] ENDOSCOPY;  Service: Cardiovascular;  Laterality: N/A;   FEMUR IM NAIL Right 03/08/2020   Procedure: INTRAMEDULLARY (IM) RETROGRADE FEMORAL NAILING;  Surgeon: Laneta Pintos, MD;  Location: [facility] OR;  Service:  Orthopedics;  Laterality: Right;   SHOULDER ARTHROSCOPY  2010   rt shoulder   SHOULDER ARTHROSCOPY  02/15/2011   Procedure: ARTHROSCOPY SHOULDER;  Surgeon: Valdene Garret;  Location: [facility];  Service: Orthopedics;  Laterality: Right;  Debridement   TONSILLECTOMY      Family History  Problem Relation Age of Onset   Emphysema Mother    Cirrhosis Father    Stroke Brother     Social History:  reports that he quit smoking about 33 years ago. His smoking use included cigarettes. He has never used smokeless tobacco. He reports that he does not drink alcohol  and does not use drugs.  Allergies:  Allergies  Allergen Reactions   Desmopressin Acetate Other (See Comments)    Hyponatremia    Zoloft  [Sertraline ] Other (See Comments)    Felt bad - Patient denies    Medications:  No current facility-administered medications on file prior to encounter.   Current Outpatient Medications on File Prior to Encounter  Medication Sig Dispense Refill   apixaban  (ELIQUIS ) 5 MG TABS tablet Take 5 mg by mouth 2 (two) times daily.     DENTA 5000 PLUS 1.1 % CREA dental cream Place 1 Application onto teeth in the morning and at bedtime.     DULoxetine  (CYMBALTA ) 60 MG capsule Take 60 mg by mouth daily.     FARXIGA  10 MG TABS tablet Take 10 mg by mouth daily.     ferrous sulfate  325 (65 FE) MG tablet Take 1 tablet (325 mg total) by mouth daily with breakfast. 30 tablet 0   fluorouracil (EFUDEX) 5 % cream Apply 1 Application topically 2 (two) times daily.  For left ear     gabapentin  (NEURONTIN ) 300 MG capsule Take 1 capsule (300 mg total) by mouth 3 (three) times daily. (Patient taking differently: Take 600 mg by mouth 3 (three) times daily.)     ibuprofen (ADVIL) 200 MG tablet Take 400 mg by mouth as needed for moderate pain.     metoprolol  tartrate (LOPRESSOR ) 25 MG tablet Take 25 mg by mouth 2 (two) times daily.     Multiple Vitamin (MULTIVITAMIN WITH MINERALS) TABS tablet Take 1  tablet by mouth daily. 30 tablet    Oxycodone  HCl 10 MG TABS Take 10 mg by mouth 2 (two) times daily as needed (severe leg pain).     pantoprazole  (PROTONIX ) 40 MG tablet Take 1 tablet (40 mg total) by mouth daily. 30 tablet    QUEtiapine  (SEROQUEL ) 300 MG tablet Take 600 mg by mouth at bedtime.     silodosin (RAPAFLO) 8 MG CAPS capsule Take 8 mg by mouth daily.     Vitamin D , Ergocalciferol , (DRISDOL ) 1.25 MG (50000 UNIT) CAPS capsule Take 50,000 Units by mouth once a week. Thursdays       ROS: Constitutional: No fever or chills Vision: No changes in vision ENT: No difficulty swallowing CV: No chest pain Pulm: No SOB or wheezing GI: No nausea or vomiting GU: No urgency or inability to hold urine Skin: No poor wound healing Neurologic: No numbness or tingling Psychiatric: No depression or anxiety Heme: No bruising Allergic: No reaction to medications or food   Exam: Blood pressure 133/77, pulse 97, temperature 98.3 F (36.8 C), temperature source Oral, resp. rate 17, height 6\' 2"  (1.88 m), weight 95.3 kg, SpO2 90%. General: No acute distress Orientation: Awake alert and oriented x 3 Mood and Affect: Cooperative and pleasant Gait: Unable to assess due to his fracture Coordination and balance: Within normal limits  Right lower extremity: Leg is shortened and externally rotated.  Pain with any attempted range of motion.  Previous surgical incisions are well-healed.  Compartments are soft and compressible.  Intact dorsiflexion/plantarflexion.  Sensation is intact to light touch.  No deformity throughout the rest of the lower extremity.  Left lower extremity: Skin without lesions. No tenderness to palpation. Full painless ROM, full strength in each muscle groups without evidence of instability.   Medical Decision Making: Data: Imaging: X-rays have been reviewed which shows a comminuted right intertrochanteric femur fracture above previous retrograde intramedullary nail.  Labs:   Results for orders placed or performed during the hospital encounter of 03/30/23 (from the past 24 hours)  CBC with Differential     Status: Abnormal   Collection Time: 03/30/23 10:34 AM  Result Value Ref Range   WBC 5.0 4.0 - 10.5 K/uL   RBC 3.71 (L) 4.22 - 5.81 MIL/uL   Hemoglobin 11.9 (L) 13.0 - 17.0 g/dL   HCT 95.6 (L) 21.3 - 08.6 %   MCV 97.0 80.0 - 100.0 fL   MCH 32.1 26.0 - 34.0 pg   MCHC 33.1 30.0 - 36.0 g/dL   RDW 57.8 46.9 - 62.9 %   Platelets 94 (L) 150 - 400 K/uL   nRBC 0.0 0.0 - 0.2 %   Neutrophils Relative % 70 %   Neutro Abs 3.5 1.7 - 7.7 K/uL   Lymphocytes Relative 19 %   Lymphs Abs 0.9 0.7 - 4.0 K/uL   Monocytes Relative 7 %   Monocytes Absolute 0.3 0.1 - 1.0 K/uL   Eosinophils Relative 3 %   Eosinophils Absolute 0.2  0.0 - 0.5 K/uL   Basophils Relative 0 %   Basophils Absolute 0.0 0.0 - 0.1 K/uL   Immature Granulocytes 1 %   Abs Immature Granulocytes 0.06 0.00 - 0.07 K/uL  Protime-INR     Status: Abnormal   Collection Time: 03/30/23 10:34 AM  Result Value Ref Range   Prothrombin Time 18.6 (H) 11.4 - 15.2 seconds   INR 1.5 (H) 0.8 - 1.2  Comprehensive metabolic panel     Status: Abnormal   Collection Time: 03/30/23 10:34 AM  Result Value Ref Range   Sodium 141 135 - 145 mmol/L   Potassium 4.0 3.5 - 5.1 mmol/L   Chloride 105 98 - 111 mmol/L   CO2 27 22 - 32 mmol/L   Glucose, Bld 111 (H) 70 - 99 mg/dL   BUN 14 8 - 23 mg/dL   Creatinine, Ser 5.78 (H) 0.61 - 1.24 mg/dL   Calcium 8.7 (L) 8.9 - 10.3 mg/dL   Total Protein 6.5 6.5 - 8.1 g/dL   Albumin  3.5 3.5 - 5.0 g/dL   AST 29 15 - 41 U/L   ALT 16 0 - 44 U/L   Alkaline Phosphatase 89 38 - 126 U/L   Total Bilirubin 0.9 0.0 - 1.2 mg/dL   GFR, Estimated 46 (L) >60 mL/min   Anion gap 9 5 - 15  Immature Platelet Fraction     Status: None   Collection Time: 03/30/23 10:34 AM  Result Value Ref Range   Immature Platelet Fraction 5.0 1.2 - 8.6 %  CBC     Status: Abnormal   Collection Time: 03/30/23  5:42 PM   Result Value Ref Range   WBC 9.0 4.0 - 10.5 K/uL   RBC 4.04 (L) 4.22 - 5.81 MIL/uL   Hemoglobin 12.9 (L) 13.0 - 17.0 g/dL   HCT 46.9 (L) 62.9 - 52.8 %   MCV 96.3 80.0 - 100.0 fL   MCH 31.9 26.0 - 34.0 pg   MCHC 33.2 30.0 - 36.0 g/dL   RDW 41.3 24.4 - 01.0 %   Platelets 98 (L) 150 - 400 K/uL   nRBC 0.0 0.0 - 0.2 %  Creatinine, serum     Status: Abnormal   Collection Time: 03/30/23  5:42 PM  Result Value Ref Range   Creatinine, Ser 1.56 (H) 0.61 - 1.24 mg/dL   GFR, Estimated 44 (L) >60 mL/min  Surgical PCR screen     Status: None   Collection Time: 03/31/23  5:00 AM   Specimen: Nasal Mucosa; Nasal Swab  Result Value Ref Range   MRSA, PCR NEGATIVE NEGATIVE   Staphylococcus aureus NEGATIVE NEGATIVE  Protime-INR     Status: Abnormal   Collection Time: 03/31/23  5:29 AM  Result Value Ref Range   Prothrombin Time 16.2 (H) 11.4 - 15.2 seconds   INR 1.3 (H) 0.8 - 1.2  APTT     Status: Abnormal   Collection Time: 03/31/23  5:29 AM  Result Value Ref Range   aPTT 57 (H) 24 - 36 seconds  Basic metabolic panel     Status: Abnormal   Collection Time: 03/31/23  5:29 AM  Result Value Ref Range   Sodium 137 135 - 145 mmol/L   Potassium 3.7 3.5 - 5.1 mmol/L   Chloride 97 (L) 98 - 111 mmol/L   CO2 23 22 - 32 mmol/L   Glucose, Bld 101 (H) 70 - 99 mg/dL   BUN 15 8 - 23 mg/dL   Creatinine, Ser 2.72 (H)  0.61 - 1.24 mg/dL   Calcium 8.9 8.9 - 78.2 mg/dL   GFR, Estimated 48 (L) >60 mL/min   Anion gap 17 (H) 5 - 15  CBC     Status: Abnormal   Collection Time: 03/31/23  5:29 AM  Result Value Ref Range   WBC 9.2 4.0 - 10.5 K/uL   RBC 3.90 (L) 4.22 - 5.81 MIL/uL   Hemoglobin 12.6 (L) 13.0 - 17.0 g/dL   HCT 95.6 (L) 21.3 - 08.6 %   MCV 95.1 80.0 - 100.0 fL   MCH 32.3 26.0 - 34.0 pg   MCHC 34.0 30.0 - 36.0 g/dL   RDW 57.8 46.9 - 62.9 %   Platelets 93 (L) 150 - 400 K/uL   nRBC 0.0 0.0 - 0.2 %     Imaging or Labs ordered: None  Medical history and chart was reviewed and case discussed  with medical provider.  Assessment/Plan: 83 year old male with a right intertrochanteric femur fracture over previous retrograde intramedullary nail.  Due to the unstable nature of his injury I recommend proceeding with removal of hardware with cephalomedullary nailing of his right hip.  Risks and benefits were discussed with the patient and his family.  Risks include but not limited to bleeding, infection, malunion, nonunion, hardware failure, hardware irritation, nerve and blood vessel injury, DVT, even the possibility anesthetic complications.  They agreed to proceed with surgery and consent was obtained.  Laneta Pintos, MD [facility] [phone] (office) [email or web address]

## 2023-04-01 DIAGNOSIS — S72001S Fracture of unspecified part of neck of right femur, sequela: Secondary | ICD-10-CM | POA: Diagnosis not present

## 2023-04-01 LAB — CBC
HCT: 24.6 % — ABNORMAL LOW (ref 39.0–52.0)
Hemoglobin: 8.5 g/dL — ABNORMAL LOW (ref 13.0–17.0)
MCH: 33.1 pg (ref 26.0–34.0)
MCHC: 34.6 g/dL (ref 30.0–36.0)
MCV: 95.7 fL (ref 80.0–100.0)
Platelets: 73 10*3/uL — ABNORMAL LOW (ref 150–400)
RBC: 2.57 MIL/uL — ABNORMAL LOW (ref 4.22–5.81)
RDW: 13.6 % (ref 11.5–15.5)
WBC: 7.5 10*3/uL (ref 4.0–10.5)
nRBC: 0 % (ref 0.0–0.2)

## 2023-04-01 LAB — BASIC METABOLIC PANEL
Anion gap: 15 (ref 5–15)
BUN: 22 mg/dL (ref 8–23)
CO2: 22 mmol/L (ref 22–32)
Calcium: 8.2 mg/dL — ABNORMAL LOW (ref 8.9–10.3)
Chloride: 99 mmol/L (ref 98–111)
Creatinine, Ser: 1.41 mg/dL — ABNORMAL HIGH (ref 0.61–1.24)
GFR, Estimated: 50 mL/min — ABNORMAL LOW (ref >60)
Glucose, Bld: 131 mg/dL — ABNORMAL HIGH (ref 70–99)
Potassium: 3.9 mmol/L (ref 3.5–5.1)
Sodium: 136 mmol/L (ref 135–145)

## 2023-04-01 MED ORDER — OXYCODONE HCL 5 MG PO TABS
5.0000 mg | ORAL_TABLET | ORAL | Status: DC | PRN
  Administered 2023-04-01 – 2023-04-03 (×5): 10 mg via ORAL
  Administered 2023-04-04: 5 mg via ORAL
  Administered 2023-04-05 – 2023-04-07 (×6): 10 mg via ORAL
  Filled 2023-04-01 (×10): qty 2
  Filled 2023-04-01: qty 1
  Filled 2023-04-01 (×3): qty 2

## 2023-04-01 MED ORDER — ROPINIROLE HCL 0.5 MG PO TABS
0.2500 mg | ORAL_TABLET | Freq: Three times a day (TID) | ORAL | Status: DC | PRN
  Administered 2023-04-02: 0.25 mg via ORAL
  Filled 2023-04-01: qty 1

## 2023-04-01 NOTE — Progress Notes (Signed)
PROGRESS NOTE    Dustin [name]  ZHY:[id number] DOB: [birth year] DOA: 03/30/2023 PCP: Darrow Bussing, MD    Brief Narrative:  83 year old with history of chronic A-fib on Eliquis, history of hip fracture with IM nail presented with mechanical fall and severe pain right thigh area.  In the emergency room he was found to have right intertrochanteric femoral neck fracture periprosthetic.  Admitted with surgical consultation.  Subjective:  Patient seen and examined.  Pain is controlled.  He is very motivated to work with physical therapy and anticipating going home tomorrow.  Not been out of bed yet.  Hemoglobin 8.6.    Assessment & Plan:   Closed traumatic intertrochanteric and periprosthetic fracture right femur: Status post ORIF Dr. Jena Gauss 2/10 Weightbearing as tolerated Eliquis on hold, probably can go back on it tomorrow if repeat hemoglobin is stable. Adequate pain medications.  PT OT and postoperative management as per surgery.  Persistent A-fib: Mostly chronic.  On Eliquis.  Rate controlled.  Resumed metoprolol  Chronic medical issues including Anxiety depression, resume Cymbalta, Seroquel and gabapentin.  Anemia of acute blood loss: Anticipated anemia due to long bone fracture.  Baseline hemoglobin about 11.  Hemoglobin 8.5. Recheck tomorrow morning.    DVT prophylaxis: SCDs Start: 03/31/23 1343 SCDs Start: 03/30/23 1309   Code Status: DNR with limited intervention Family Communication: None at bedside Disposition Plan: Status is: Inpatient Remains inpatient appropriate because: Immediate postop     Consultants:  Orthopedics  Procedures:  ORIF right hip  Antimicrobials:  Perioperative     Objective: Vitals:   03/31/23 1716 03/31/23 2104 04/01/23 0448 04/01/23 0843  BP:  116/79 118/71 (!) 107/57  Pulse:  88 84 98  Resp: 19 18 18 17   Temp: 97.6 F (36.4 C) 97.9 F (36.6 C) 98.6 F (37 C) 97.8 F (36.6 C)  TempSrc: Axillary Oral Oral Oral  SpO2:  93% 98% 99% 98%  Weight:      Height:        Intake/Output Summary (Last 24 hours) at 04/01/2023 1110 Last data filed at 03/31/2023 1700 Gross per 24 hour  Intake 1160 ml  Output 800 ml  Net 360 ml   Filed Weights   03/30/23 1017  Weight: 95.3 kg    Examination:  General: Looks fairly comfortable.  On room air. Cardiovascular: S1-S2 normal.  Regular rate rhythm. Respiratory: Bilateral clear.  No added sounds. Patient has ecchymosis on his left side of the chest from previous  injury.  Nontender. Gastrointestinal: Soft.  Nontender bowel sound present. Ext: Right lateral thigh incision clean and dry.  Edematous.  Edema extending to the knees.  Distal neurovascular status intact. Neuro: Alert awake and oriented.  Pleasant interaction.      Data Reviewed: I have personally reviewed following labs and imaging studies  CBC: Recent Labs  Lab 03/30/23 1034 03/30/23 1742 03/31/23 0529 04/01/23 0539  WBC 5.0 9.0 9.2 7.5  NEUTROABS 3.5  --   --   --   HGB 11.9* 12.9* 12.6* 8.5*  HCT 36.0* 38.9* 37.1* 24.6*  MCV 97.0 96.3 95.1 95.7  PLT 94* 98* 93* 73*   Basic Metabolic Panel: Recent Labs  Lab 03/30/23 1034 03/30/23 1742 03/31/23 0529 04/01/23 0539  NA 141  --  137 136  K 4.0  --  3.7 3.9  CL 105  --  97* 99  CO2 27  --  23 22  GLUCOSE 111*  --  101* 131*  BUN 14  --  15  22  CREATININE 1.51* 1.56* 1.45* 1.41*  CALCIUM 8.7*  --  8.9 8.2*   GFR: Estimated Creatinine Clearance: 47.8 mL/min (A) (by C-G formula based on SCr of 1.41 mg/dL (H)). Liver Function Tests: Recent Labs  Lab 03/30/23 1034  AST 29  ALT 16  ALKPHOS 89  BILITOT 0.9  PROT 6.5  ALBUMIN 3.5   No results for input(s): "LIPASE", "AMYLASE" in the last 168 hours. No results for input(s): "AMMONIA" in the last 168 hours. Coagulation Profile: Recent Labs  Lab 03/30/23 1034 03/31/23 0529  INR 1.5* 1.3*   Cardiac Enzymes: No results for input(s): "CKTOTAL", "CKMB", "CKMBINDEX", "TROPONINI"  in the last 168 hours. BNP (last 3 results) No results for input(s): "PROBNP" in the last 8760 hours. HbA1C: No results for input(s): "HGBA1C" in the last 72 hours. CBG: No results for input(s): "GLUCAP" in the last 168 hours. Lipid Profile: No results for input(s): "CHOL", "HDL", "LDLCALC", "TRIG", "CHOLHDL", "LDLDIRECT" in the last 72 hours. Thyroid Function Tests: No results for input(s): "TSH", "T4TOTAL", "FREET4", "T3FREE", "THYROIDAB" in the last 72 hours. Anemia Panel: No results for input(s): "VITAMINB12", "FOLATE", "FERRITIN", "TIBC", "IRON", "RETICCTPCT" in the last 72 hours. Sepsis Labs: No results for input(s): "PROCALCITON", "LATICACIDVEN" in the last 168 hours.  Recent Results (from the past 240 hours)  Surgical PCR screen     Status: None   Collection Time: 03/31/23  5:00 AM   Specimen: Nasal Mucosa; Nasal Swab  Result Value Ref Range Status   MRSA, PCR NEGATIVE NEGATIVE Final   Staphylococcus aureus NEGATIVE NEGATIVE Final    Comment: (NOTE) The Xpert SA Assay (FDA approved for NASAL specimens in patients 20 years of age and older), is one component of a comprehensive surveillance program. It is not intended to diagnose infection nor to guide or monitor treatment. Performed at [name], [address], [place], [place] [address]          Radiology Studies: DG FEMUR PORT, MIN 2 VIEWS RIGHT Result Date: 03/31/2023 CLINICAL DATA:  Operative fixation of a right intertrochanteric fracture. EXAM: RIGHT FEMUR PORTABLE 2 VIEW COMPARISON:  03/30/2023 and C-arm images obtained earlier today. FINDINGS: Interval compression screw and rod fixation of the recently demonstrated right intertrochanteric fracture with satisfactory position and alignment. Old, healed distal right femur fracture. The previous distal femur fixation hardware has been removed. No new fracture or dislocation. IMPRESSION: Interval compression screw and rod fixation of the recently demonstrated  right intertrochanteric fracture with satisfactory position and alignment. Electronically Signed   By: Beckie Salts M.D.   On: 03/31/2023 17:20   DG FEMUR, MIN 2 VIEWS RIGHT Result Date: 03/31/2023 CLINICAL DATA:  Elective surgery. Right femur hardware removal and new femur fixation. EXAM: RIGHT FEMUR 2 VIEWS COMPARISON:  Hip and knee radiograph 03/30/2023 FINDINGS: Eight fluoroscopic spot views of the right femur submitted from the operating room. The previous femoral intramedullary nail in fixating screws has been removed. Subsequent short proximal femoral intramedullary nail with trans trochanteric and distal locking screw fixation traversing intertrochanteric femur fracture. Fluoroscopy time 1:12 0.7 seconds. Dose 18.81 mGy IMPRESSION: Procedural fluoroscopy during proximal femur fracture fixation and removal of previous femur hardware. Electronically Signed   By: Narda Rutherford M.D.   On: 03/31/2023 12:25   DG C-Arm 1-60 Min-No Report Result Date: 03/31/2023 Fluoroscopy was utilized by the requesting physician.  No radiographic interpretation.   DG Chest 1 View Result Date: 03/30/2023 CLINICAL DATA:  [id number] Preoperative cardiovascular examination [id number], right hip fracture  EXAM: CHEST  1 VIEW COMPARISON:  11/12/2022 chest radiograph. FINDINGS: Partially visualized surgical hardware from ACDF. Stable cardiomediastinal silhouette with mild cardiomegaly. No pneumothorax. No pleural effusion. Lungs appear clear, with no acute consolidative airspace disease and no pulmonary edema. IMPRESSION: Mild cardiomegaly without pulmonary edema. No active pulmonary disease. Electronically Signed   By: Delbert Phenix M.D.   On: 03/30/2023 12:51        Scheduled Meds:  dapagliflozin propanediol  10 mg Oral Daily   docusate sodium  100 mg Oral BID   DULoxetine  60 mg Oral Daily   ferrous sulfate  325 mg Oral Q breakfast   gabapentin  600 mg Oral TID   metoprolol tartrate  25 mg Oral BID   multivitamin with  minerals  1 tablet Oral Daily   mupirocin ointment  1 Application Nasal BID   pantoprazole  40 mg Oral Daily   QUEtiapine  600 mg Oral QHS   senna-docusate  2 tablet Oral BID   sodium chloride flush  3 mL Intravenous Q12H   [START ON 04/06/2023] Vitamin D (Ergocalciferol)  50,000 Units Oral Weekly   Continuous Infusions:  sodium chloride 40 mL/hr at 03/31/23 1410     LOS: 2 days    Time spent: 40 minutes     Dorcas Carrow, MD [facility]

## 2023-04-01 NOTE — Progress Notes (Addendum)
Orthopaedic Trauma Progress Note  SUBJECTIVE: Doing fairly well this morning.  Pain controlled.  Has not been up out of bed yet since surgery but would like to mobilize to the bathroom this morning.  Feels that he needs to have a bowel movement.  Has neuropathy at baseline but denies any significant numbness or tingling throughout the right lower extremity.  No other issues of note.  Tolerating diet and fluids.  OBJECTIVE:  Vitals:   04/01/23 0448 04/01/23 0843  BP: 118/71 (!) 107/57  Pulse: 84 98  Resp: 18 17  Temp: 98.6 F (37 C) 97.8 F (36.6 C)  SpO2: 99% 98%    General: Sitting up in bed, no acute distress Respiratory: No increased work of breathing.  Right lower extremity: Dressings clean, dry, intact.  Soreness to the thigh as expected.  Ankle dorsiflexion/plantarflexion is intact.  Patient with neuropathy at baseline but does endorse sensation of light touch over all aspects of the foot.  Compartment soft and compressible.+ DP pulse  IMAGING: Stable post op imaging.   LABS:  Results for orders placed or performed during the hospital encounter of 03/30/23 (from the past 24 hours)  VITAMIN D 25 Hydroxy (Vit-D Deficiency, Fractures)     Status: None   Collection Time: 03/31/23  4:30 PM  Result Value Ref Range   Vit D, 25-Hydroxy 65.63 30 - 100 ng/mL  Basic metabolic panel     Status: Abnormal   Collection Time: 04/01/23  5:39 AM  Result Value Ref Range   Sodium 136 135 - 145 mmol/L   Potassium 3.9 3.5 - 5.1 mmol/L   Chloride 99 98 - 111 mmol/L   CO2 22 22 - 32 mmol/L   Glucose, Bld 131 (H) 70 - 99 mg/dL   BUN 22 8 - 23 mg/dL   Creatinine, Ser 1.61 (H) 0.61 - 1.24 mg/dL   Calcium 8.2 (L) 8.9 - 10.3 mg/dL   GFR, Estimated 50 (L) >60 mL/min   Anion gap 15 5 - 15  CBC     Status: Abnormal   Collection Time: 04/01/23  5:39 AM  Result Value Ref Range   WBC 7.5 4.0 - 10.5 K/uL   RBC 2.57 (L) 4.22 - 5.81 MIL/uL   Hemoglobin 8.5 (L) 13.0 - 17.0 g/dL   HCT 09.6 (L) 04.5 -  52.0 %   MCV 95.7 80.0 - 100.0 fL   MCH 33.1 26.0 - 34.0 pg   MCHC 34.6 30.0 - 36.0 g/dL   RDW 40.9 81.1 - 91.4 %   Platelets 73 (L) 150 - 400 K/uL   nRBC 0.0 0.0 - 0.2 %    ASSESSMENT: Dustin [name] is a 83 y.o. male, 1 Day Post-Op s/p REMOVAL OF HARDWARE RIGHT FEMUR CEPHALOMEDULLARY NAILING RIGHT INTERTROCHANTERIC FEMUR FRACTURE  CV/Blood loss: Acute blood loss anemia, Hgb 8.5 this morning. Hemodynamically stable.   PLAN: Weightbearing: WBAT RLE Incisional and dressing care: Reinforce dressings as needed  Showering: Okay to begin showering and getting incisions wet 04/03/2023 Orthopedic device(s): None  Pain management: Continue current multimodal regimen VTE prophylaxis: Hold restarting Eliquis until hemoglobin stabilizes.  SCDs ID:  Ancef 2gm post op Foley/Lines: No Foley.  KVO IVFs Impediments to Fracture Healing: Vitamin D level 65, no additional supplementation needed.   Dispo: PT/OT evaluation today, dispo pending.  Patient hopes to be able to return home.  Son/daughter-in-law available to assist at discharge.  Plan to remove dressings RLE tomorrow 04/02/2023.  Continue to monitor CBC, restart Eliquis once  hemoglobin stable  Follow - up plan: 2 weeks after discharge for wound check and repeat x-rays  Contact information:  Truitt Merle MD, Ulyses Southward PA-C. After hours and holidays please check [email or web address] for group call information for [facility]   Bryanah Sidell A. Michaelyn Barter, PA-C [phone] (office) [email or web address]

## 2023-04-01 NOTE — Evaluation (Signed)
Physical Therapy Evaluation  Patient Details Name: Dustin [name] MRN: [id number] DOB: 01-27-1941 Today's Date: 04/01/2023  History of Present Illness  Pt is an 83 y/o male who presents s/p Fall, sustaining a R intertrochanteric femoral neck periprosthetic fracture. He is now s/p ORIF on 03/31/2023. PMH significant for BPH, CHF, neuropathy, 4 level ACDF 2014, R IM nail 2022.  Clinical Impression  Pt admitted with above diagnosis. Pt currently with functional limitations due to the deficits listed below (see PT Problem List). At the time of PT eval pt was able to perform transfers with +2 assist and Stedy for support. Pt does have family support available at d/c, however based on performance today, recommend post-acute rehab <3 hours/day to maximize functional recovery, decrease risk for falls, and facilitate return home with family support. Pt will benefit from acute skilled PT to increase their independence and safety with mobility to allow discharge.           If plan is discharge home, recommend the following: Two people to help with walking and/or transfers;Two people to help with bathing/dressing/bathroom;Assistance with cooking/housework;Assist for transportation;Help with stairs or ramp for entrance   Can travel by private vehicle   No    Equipment Recommendations None recommended by PT  Recommendations for Other Services       Functional Status Assessment Patient has had a recent decline in their functional status and demonstrates the ability to make significant improvements in function in a reasonable and predictable amount of time.     Precautions / Restrictions Precautions Precautions: Fall Restrictions Weight Bearing Restrictions Per Provider Order: Yes RLE Weight Bearing Per Provider Order: Weight bearing as tolerated      Mobility  Bed Mobility Overal bed mobility: Needs Assistance Bed Mobility: Supine to Sit     Supine to sit: Mod assist, Max assist      General bed mobility comments: Slow and guarded due to pain, requiring mod-max assist for advancement of RLE towards EOB. Once R foot was off EOB and pt's trunk was upright, pt slowly able to scoot around to sit fully EOB with feet on the floor. During this time pt was advancing RLE around with hands or therapist was advancing RLE for him.    Transfers Overall transfer level: Needs assistance Equipment used: Rolling walker (2 wheels), Ambulation equipment used Transfers: Sit to/from Stand, Bed to chair/wheelchair/BSC Sit to Stand: Max assist, +2 physical assistance, From elevated surface, Via lift equipment           General transfer comment: Attempted first with RW for support. Pt with difficulty achieving upright posture and reports lightheadedness in static standing, prompting return to sit EOB. BP 90/53 during this time. Stedy then utilized for transition to Science writer. Transfer via Lift Equipment: Stedy  Ambulation/Gait               General Gait Details: Unable at this time.  Stairs            Wheelchair Mobility     Tilt Bed    Modified Rankin (Stroke Patients Only)       Balance Overall balance assessment: Needs assistance Sitting-balance support: Feet supported, No upper extremity supported Sitting balance-Leahy Scale: Fair     Standing balance support: Bilateral upper extremity supported, During functional activity, Reliant on assistive device for balance Standing balance-Leahy Scale: Poor  Pertinent Vitals/Pain Pain Assessment Pain Assessment: Faces Faces Pain Scale: Hurts whole lot Pain Location: R hip with movement Pain Descriptors / Indicators: Operative site guarding, Sore, Grimacing, Moaning Pain Intervention(s): Limited activity within patient's tolerance, Monitored during session, Repositioned    Home Living Family/patient expects to be discharged to:: Private residence Living  Arrangements: Alone Available Help at Discharge: Family;Available 24 hours/day Type of Home: House Home Access: Stairs to enter Entrance Stairs-Rails: Right;Left;Can reach both Entrance Stairs-Number of Steps: 3 Alternate Level Stairs-Number of Steps: flight Home Layout: Two level;Able to live on main level with bedroom/bathroom Home Equipment: Grab bars - toilet;Grab bars - tub/shower;Hand held shower head;Shower seat;BSC/3in1;Rolling Environmental consultant (2 wheels);Cane - single point      Prior Function Prior Level of Function : Independent/Modified Independent;Driving             Mobility Comments: Uses cane at times when out, "furniture cruises" as needed inside home ADLs Comments: Mod I for bathing and dressing     Extremity/Trunk Assessment   Upper Extremity Assessment Upper Extremity Assessment: LUE deficits/detail LUE Deficits / Details: Noted wrist appears puffy around IV site. RN notified.    Lower Extremity Assessment Lower Extremity Assessment: RLE deficits/detail RLE Deficits / Details: Acute pain, decreased strength and AROM consistent with above mentioned injury and subsequent surgery.    Cervical / Trunk Assessment Cervical / Trunk Assessment: Other exceptions Cervical / Trunk Exceptions: Forward head posture with rounded shoulders  Communication   Communication Communication: Impaired Factors Affecting Communication: Hearing impaired (hearing aids)    Cognition Arousal: Alert Behavior During Therapy: WFL for tasks assessed/performed   PT - Cognitive impairments: No apparent impairments                         Following commands: Intact       Cueing Cueing Techniques: Verbal cues, Gestural cues     General Comments      Exercises General Exercises - Lower Extremity Heel Slides: 10 reps (minimal AROM)   Assessment/Plan    PT Assessment Patient needs continued PT services  PT Problem List         PT Treatment Interventions DME  instruction;Gait training;Stair training;Functional mobility training;Therapeutic activities;Therapeutic exercise;Balance training;Patient/family education    PT Goals (Current goals can be found in the Care Plan section)  Acute Rehab PT Goals Patient Stated Goal: Home at d/c PT Goal Formulation: With patient/family Time For Goal Achievement: 04/15/23 Potential to Achieve Goals: Good    Frequency Min 1X/week     Co-evaluation PT/OT/SLP Co-Evaluation/Treatment: Yes Reason for Co-Treatment: Complexity of the patient's impairments (multi-system involvement);For patient/therapist safety;To address functional/ADL transfers PT goals addressed during session: Mobility/safety with mobility;Balance;Proper use of DME;Strengthening/ROM         AM-PAC PT "6 Clicks" Mobility  Outcome Measure Help needed turning from your back to your side while in a flat bed without using bedrails?: A Lot Help needed moving from lying on your back to sitting on the side of a flat bed without using bedrails?: A Lot Help needed moving to and from a bed to a chair (including a wheelchair)?: Total Help needed standing up from a chair using your arms (e.g., wheelchair or bedside chair)?: Total Help needed to walk in hospital room?: Total Help needed climbing 3-5 steps with a railing? : Total 6 Click Score: 8    End of Session Equipment Utilized During Treatment: Gait belt Activity Tolerance: Patient tolerated treatment well Patient left: in chair;with call  bell/phone within reach;with chair alarm set;with family/visitor present Nurse Communication: Mobility status;Need for lift equipment;Patient requests pain meds (L wrist at IV site looks puffy) PT Visit Diagnosis: Unsteadiness on feet (R26.81);Pain Pain - Right/Left: Right Pain - part of body: Hip    Time: 1352-1445 PT Time Calculation (min) (ACUTE ONLY): 53 min   Charges:   PT Evaluation $PT Eval Moderate Complexity: 1 Mod PT Treatments $Gait  Training: 8-22 mins PT General Charges $$ ACUTE PT VISIT: 1 Visit         Conni Slipper, PT, DPT Acute Rehabilitation Services Secure Chat Preferred Office: [phone]   Marylynn Pearson 04/01/2023, 3:54 PM

## 2023-04-01 NOTE — Anesthesia Postprocedure Evaluation (Signed)
Anesthesia Post Note  Patient: Dustin [name]  Procedure(s) Performed: HARDWARE REMOVAL (Right) INTRAMEDULLARY (IM) NAIL INTERTROCHANTERIC (Right)     Patient location during evaluation: PACU Anesthesia Type: General Level of consciousness: awake Pain management: pain level controlled Vital Signs Assessment: post-procedure vital signs reviewed and stable Respiratory status: spontaneous breathing, nonlabored ventilation and respiratory function stable Cardiovascular status: blood pressure returned to baseline and stable Postop Assessment: no apparent nausea or vomiting Anesthetic complications: no   There were no known notable events for this encounter.  Last Vitals:  Vitals:   03/31/23 2104 04/01/23 0448  BP: 116/79 118/71  Pulse: 88 84  Resp: 18 18  Temp: 36.6 C 37 C  SpO2: 98% 99%    Last Pain:  Vitals:   04/01/23 0448  TempSrc: Oral  PainSc:                  Wiley Magan P Jamarco Zaldivar

## 2023-04-01 NOTE — Progress Notes (Signed)
Occupational Therapy Evaluation Patient Details Name: Dustin [name] MRN: [id number] DOB: 1940/11/25 Today's Date: 04/01/2023   History of Present Illness   83 y/o male who presents s/p Fall, sustaining a R intertrochanteric femoral neck periprosthetic fracture. He is now s/p ORIF on 03/31/2023. PMH significant for BPH, CHF, neuropathy, 4 level ACDF 2014, R IM nail 2022.     Clinical Impressions Patient is s/p R ORIF surgery resulting in functional limitations due to the deficits listed below (see OT Problem List). Prior to admit, pt was independent with all ADL tasks and functional mobility Patient will benefit from acute skilled OT to increase their safety and independence with ADL and functional mobility for ADL to allow facilitate discharge. Patient will benefit from continued inpatient follow up therapy, <3 hours/day. OT will continue to follow pt acutely.        If plan is discharge home, recommend the following:   Two people to help with walking and/or transfers;Two people to help with bathing/dressing/bathroom;Help with stairs or ramp for entrance     Functional Status Assessment   Patient has had a recent decline in their functional status and demonstrates the ability to make significant improvements in function in a reasonable and predictable amount of time.     Equipment Recommendations   None recommended by OT (Pt has all needed DME)      Precautions/Restrictions   Precautions Precautions: Fall Restrictions Weight Bearing Restrictions Per Provider Order: Yes RLE Weight Bearing Per Provider Order: Weight bearing as tolerated     Mobility Bed Mobility    General bed mobility comments: See PT eval. Pt sitting on EOB upon therapy arrival.    Transfers Overall transfer level: Needs assistance Equipment used: Rolling walker (2 wheels), Ambulation equipment used Transfers: Sit to/from Stand, Bed to chair/wheelchair/BSC Sit to Stand: Max assist, +2  physical assistance, From elevated surface, Via lift equipment    General transfer comment: Attempted first with RW for support. Pt with difficulty achieving upright posture and reports lightheadedness in static standing, prompting return to sit EOB. BP 90/53 during this time. Stedy then utilized for transition to Science writer. Transfer via Lift Equipment: Stedy    Balance Overall balance assessment: Needs assistance Sitting-balance support: Feet supported, No upper extremity supported Sitting balance-Leahy Scale: Fair     Standing balance support: Bilateral upper extremity supported, During functional activity, Reliant on assistive device for balance Standing balance-Leahy Scale: Poor        ADL either performed or assessed with clinical judgement   ADL Overall ADL's : Needs assistance/impaired     Grooming: Set up;Sitting   Upper Body Bathing: Set up;Sitting   Lower Body Bathing: Total assistance;Bed level   Upper Body Dressing : Set up;Sitting   Lower Body Dressing: Total assistance;Bed level   Toilet Transfer: Total assistance;BSC/3in1 (using Stedy)   Toileting- Clothing Manipulation and Hygiene: Total assistance;Sit to/from stand (using stedy for balance)        Vision Baseline Vision/History: 0 No visual deficits Ability to See in Adequate Light: 0 Adequate Patient Visual Report: No change from baseline Vision Assessment?: No apparent visual deficits     Perception Perception: Not tested       Praxis Praxis: Not tested       Pertinent Vitals/Pain Pain Assessment Pain Assessment: Faces Faces Pain Scale: Hurts whole lot Pain Location: R hip with movement Pain Descriptors / Indicators: Operative site guarding, Sore, Grimacing, Moaning Pain Intervention(s): Limited activity within patient's tolerance, Monitored during session,  Repositioned     Extremity/Trunk Assessment Upper Extremity Assessment Upper Extremity Assessment: Right hand  dominant;Generalized weakness LUE Deficits / Details: Noted wrist appears puffy around IV site. RN notified.   Lower Extremity Assessment Lower Extremity Assessment: Defer to PT evaluation RLE Deficits / Details: Acute pain, decreased strength and AROM consistent with above mentioned injury and subsequent surgery.   Cervical / Trunk Assessment Cervical / Trunk Assessment: Other exceptions Cervical / Trunk Exceptions: Forward head posture with rounded shoulders   Communication Communication Communication: Impaired Factors Affecting Communication: Hearing impaired (hearing aids)   Cognition Arousal: Alert Behavior During Therapy: WFL for tasks assessed/performed Cognition: No apparent impairments    Following commands: Intact       Cueing  General Comments   Cueing Techniques: Verbal cues;Gestural cues              Home Living Family/patient expects to be discharged to:: Private residence Living Arrangements: Alone Available Help at Discharge: Family;Available 24 hours/day Type of Home: House Home Access: Stairs to enter [employer] of Steps: 3 Entrance Stairs-Rails: Right;Left;Can reach both Home Layout: Two level;Able to live on main level with bedroom/bathroom Alternate Level Stairs-Number of Steps: flight Alternate Level Stairs-Rails: Left Bathroom Shower/Tub: Walk-in shower;Door   Bathroom Toilet: Handicapped height     Home Equipment: Grab bars - toilet;Grab bars - tub/shower;Hand held shower head;Shower seat;BSC/3in1;Rolling Environmental consultant (2 wheels);Cane - single point          Prior Functioning/Environment Prior Level of Function : Independent/Modified Independent;Driving             Mobility Comments: Uses cane at times when out, "furniture cruises" as needed inside home ADLs Comments: Mod I for bathing and dressing    OT Problem List: Decreased strength;Decreased activity tolerance;Impaired balance (sitting and/or standing);Decreased  knowledge of use of DME or AE;Pain   OT Treatment/Interventions: Self-care/ADL training;Therapeutic exercise;Energy conservation;DME and/or AE instruction;Manual therapy;Modalities;Therapeutic activities;Patient/family education;Balance training      OT Goals(Current goals can be found in the care plan section)   Acute Rehab OT Goals Patient Stated Goal: to go home OT Goal Formulation: With patient Time For Goal Achievement: 04/15/23 Potential to Achieve Goals: Fair   OT Frequency:  Min 1X/week    Co-evaluation PT/OT/SLP Co-Evaluation/Treatment: Yes Reason for Co-Treatment: Complexity of the patient's impairments (multi-system involvement);For patient/therapist safety;To address functional/ADL transfers PT goals addressed during session: Mobility/safety with mobility;Balance;Proper use of DME;Strengthening/ROM OT goals addressed during session: ADL's and self-care;Proper use of Adaptive equipment and DME;Strengthening/ROM      AM-PAC OT "6 Clicks" Daily Activity     Outcome Measure Help from another person eating meals?: None Help from another person taking care of personal grooming?: None Help from another person toileting, which includes using toliet, bedpan, or urinal?: A Lot Help from another person bathing (including washing, rinsing, drying)?: A Lot Help from another person to put on and taking off regular upper body clothing?: A Little Help from another person to put on and taking off regular lower body clothing?: Total 6 Click Score: 16   End of Session Equipment Utilized During Treatment: Gait belt;Rolling walker (2 wheels);Other (comment) Antony Salmon) Nurse Communication: Mobility status  Activity Tolerance: Patient tolerated treatment well;Patient limited by pain Patient left: in chair;with call bell/phone within reach;with chair alarm set;with family/visitor present  OT Visit Diagnosis: Muscle weakness (generalized) (M62.81);History of falling (Z91.81);Pain Pain -  Right/Left: Right Pain - part of body: Knee  Time: 1610-9604 OT Time Calculation (min): 33 min Charges:  OT General Charges $OT Visit: 1 Visit OT Evaluation $OT Eval Moderate Complexity: 1 Mod OT Treatments $Therapeutic Activity: 8-22 mins  Limmie Patricia, OTR/L,CBIS  Supplemental OT - [facility] and [facility] Secure Chat Preferred    Amando Ishikawa, Charisse March 04/01/2023, 4:28 PM

## 2023-04-02 ENCOUNTER — Inpatient Hospital Stay (HOSPITAL_COMMUNITY): Payer: Medicare Other

## 2023-04-02 DIAGNOSIS — S72001A Fracture of unspecified part of neck of right femur, initial encounter for closed fracture: Secondary | ICD-10-CM | POA: Diagnosis not present

## 2023-04-02 DIAGNOSIS — R571 Hypovolemic shock: Secondary | ICD-10-CM

## 2023-04-02 DIAGNOSIS — I4819 Other persistent atrial fibrillation: Secondary | ICD-10-CM | POA: Diagnosis not present

## 2023-04-02 DIAGNOSIS — J9601 Acute respiratory failure with hypoxia: Secondary | ICD-10-CM | POA: Diagnosis not present

## 2023-04-02 LAB — COMPREHENSIVE METABOLIC PANEL
ALT: 140 U/L — ABNORMAL HIGH (ref 0–44)
AST: 458 U/L — ABNORMAL HIGH (ref 15–41)
Albumin: 2.8 g/dL — ABNORMAL LOW (ref 3.5–5.0)
Alkaline Phosphatase: 61 U/L (ref 38–126)
Anion gap: 12 (ref 5–15)
BUN: 31 mg/dL — ABNORMAL HIGH (ref 8–23)
CO2: 22 mmol/L (ref 22–32)
Calcium: 7.7 mg/dL — ABNORMAL LOW (ref 8.9–10.3)
Chloride: 99 mmol/L (ref 98–111)
Creatinine, Ser: 2.51 mg/dL — ABNORMAL HIGH (ref 0.61–1.24)
GFR, Estimated: 25 mL/min — ABNORMAL LOW (ref >60)
Glucose, Bld: 156 mg/dL — ABNORMAL HIGH (ref 70–99)
Potassium: 4.3 mmol/L (ref 3.5–5.1)
Sodium: 133 mmol/L — ABNORMAL LOW (ref 135–145)
Total Bilirubin: 0.9 mg/dL (ref 0.0–1.2)
Total Protein: 5.3 g/dL — ABNORMAL LOW (ref 6.5–8.1)

## 2023-04-02 LAB — CBC
HCT: 27.3 % — ABNORMAL LOW (ref 39.0–52.0)
HCT: 24.5 % — ABNORMAL LOW (ref 39.0–52.0)
Hemoglobin: 9 g/dL — ABNORMAL LOW (ref 13.0–17.0)
Hemoglobin: 7.9 g/dL — ABNORMAL LOW (ref 13.0–17.0)
MCH: 31.8 pg (ref 26.0–34.0)
MCH: 31.5 pg (ref 26.0–34.0)
MCHC: 33 g/dL (ref 30.0–36.0)
MCHC: 32.2 g/dL (ref 30.0–36.0)
MCV: 96.5 fL (ref 80.0–100.0)
MCV: 97.6 fL (ref 80.0–100.0)
Platelets: 126 K/uL — ABNORMAL LOW (ref 150–400)
Platelets: 116 10*3/uL — ABNORMAL LOW (ref 150–400)
RBC: 2.83 MIL/uL — ABNORMAL LOW (ref 4.22–5.81)
RBC: 2.51 MIL/uL — ABNORMAL LOW (ref 4.22–5.81)
RDW: 13.9 % (ref 11.5–15.5)
RDW: 13.9 % (ref 11.5–15.5)
WBC: 11.5 K/uL — ABNORMAL HIGH (ref 4.0–10.5)
WBC: 11.8 10*3/uL — ABNORMAL HIGH (ref 4.0–10.5)
nRBC: 0 % (ref 0.0–0.2)
nRBC: 0 % (ref 0.0–0.2)

## 2023-04-02 LAB — CBC WITH DIFFERENTIAL/PLATELET
Abs Immature Granulocytes: 0.09 10*3/uL — ABNORMAL HIGH (ref 0.00–0.07)
Basophils Absolute: 0 10*3/uL (ref 0.0–0.1)
Basophils Relative: 0 %
Eosinophils Absolute: 0.2 10*3/uL (ref 0.0–0.5)
Eosinophils Relative: 2 %
HCT: 24.3 % — ABNORMAL LOW (ref 39.0–52.0)
Hemoglobin: 8 g/dL — ABNORMAL LOW (ref 13.0–17.0)
Immature Granulocytes: 1 %
Lymphocytes Relative: 15 %
Lymphs Abs: 1.3 10*3/uL (ref 0.7–4.0)
MCH: 32 pg (ref 26.0–34.0)
MCHC: 32.9 g/dL (ref 30.0–36.0)
MCV: 97.2 fL (ref 80.0–100.0)
Monocytes Absolute: 1 10*3/uL (ref 0.1–1.0)
Monocytes Relative: 11 %
Neutro Abs: 6.2 10*3/uL (ref 1.7–7.7)
Neutrophils Relative %: 71 %
Platelets: 111 10*3/uL — ABNORMAL LOW (ref 150–400)
RBC: 2.5 MIL/uL — ABNORMAL LOW (ref 4.22–5.81)
RDW: 13.9 % (ref 11.5–15.5)
WBC: 8.8 10*3/uL (ref 4.0–10.5)
nRBC: 0 % (ref 0.0–0.2)

## 2023-04-02 LAB — GLUCOSE, CAPILLARY
Glucose-Capillary: 145 mg/dL — ABNORMAL HIGH (ref 70–99)
Glucose-Capillary: 145 mg/dL — ABNORMAL HIGH (ref 70–99)

## 2023-04-02 LAB — BLOOD GAS, ARTERIAL
Acid-base deficit: 0.5 mmol/L (ref 0.0–2.0)
Bicarbonate: 23.1 mmol/L (ref 20.0–28.0)
Drawn by: 36277
O2 Saturation: 99.8 %
Patient temperature: 37.5
pCO2 arterial: 35 mm[Hg] (ref 32–48)
pH, Arterial: 7.43 (ref 7.35–7.45)
pO2, Arterial: 102 mm[Hg] (ref 83–108)

## 2023-04-02 LAB — MAGNESIUM: Magnesium: 1.7 mg/dL (ref 1.7–2.4)

## 2023-04-02 LAB — PHOSPHORUS: Phosphorus: 2.3 mg/dL — ABNORMAL LOW (ref 2.5–4.6)

## 2023-04-02 LAB — LACTIC ACID, PLASMA
Lactic Acid, Venous: 2.2 mmol/L (ref 0.5–1.9)
Lactic Acid, Venous: 1.8 mmol/L (ref 0.5–1.9)

## 2023-04-02 LAB — PREPARE RBC (CROSSMATCH)

## 2023-04-02 LAB — HEPARIN LEVEL (UNFRACTIONATED): Heparin Unfractionated: 0.93 [IU]/mL — ABNORMAL HIGH (ref 0.30–0.70)

## 2023-04-02 MED ORDER — SODIUM PHOSPHATES 45 MMOLE/15ML IV SOLN
15.0000 mmol | Freq: Once | INTRAVENOUS | Status: AC
  Administered 2023-04-02: 15 mmol via INTRAVENOUS
  Filled 2023-04-02: qty 5

## 2023-04-02 MED ORDER — METOPROLOL TARTRATE 25 MG PO TABS
25.0000 mg | ORAL_TABLET | Freq: Two times a day (BID) | ORAL | Status: DC
  Administered 2023-04-03 – 2023-04-06 (×7): 25 mg via ORAL
  Filled 2023-04-02 (×7): qty 1

## 2023-04-02 MED ORDER — SODIUM CHLORIDE 0.9 % IV BOLUS
1000.0000 mL | Freq: Once | INTRAVENOUS | Status: AC
  Administered 2023-04-02: 1000 mL via INTRAVENOUS

## 2023-04-02 MED ORDER — SODIUM CHLORIDE 0.9% IV SOLUTION: Freq: Once | INTRAVENOUS | Status: DC

## 2023-04-02 MED ORDER — HEPARIN (PORCINE) 25000 UT/250ML-% IV SOLN
14.0000 [IU]/kg/h | INTRAVENOUS | Status: DC
  Administered 2023-04-02: 14 [IU]/kg/h via INTRAVENOUS
  Filled 2023-04-02: qty 250

## 2023-04-02 MED ORDER — SODIUM CHLORIDE 0.9 % IV BOLUS
500.0000 mL | Freq: Once | INTRAVENOUS | Status: AC
Start: 2023-04-02 — End: 2023-04-02
  Administered 2023-04-02: 500 mL via INTRAVENOUS

## 2023-04-02 MED ORDER — APIXABAN 2.5 MG PO TABS: 2.5000 mg | ORAL_TABLET | Freq: Two times a day (BID) | ORAL | Status: DC

## 2023-04-02 MED ORDER — ORAL CARE MOUTH RINSE: 15.0000 mL | OROMUCOSAL | Status: DC | PRN

## 2023-04-02 MED ORDER — CHLORHEXIDINE GLUCONATE CLOTH 2 % EX PADS
6.0000 | MEDICATED_PAD | Freq: Every day | CUTANEOUS | Status: DC
  Administered 2023-04-02 – 2023-04-08 (×7): 6 via TOPICAL

## 2023-04-02 MED ORDER — QUETIAPINE FUMARATE 100 MG PO TABS: 200.0000 mg | ORAL_TABLET | Freq: Every day | ORAL | Status: DC

## 2023-04-02 MED ORDER — NALOXONE HCL 0.4 MG/ML IJ SOLN: 0.4000 mg | INTRAMUSCULAR | Status: DC | PRN

## 2023-04-02 MED ORDER — SODIUM CHLORIDE 0.9 % IV BOLUS
500.0000 mL | Freq: Once | INTRAVENOUS | Status: AC | PRN
  Administered 2023-04-02: 500 mL via INTRAVENOUS

## 2023-04-02 MED ORDER — METOPROLOL TARTRATE 5 MG/5ML IV SOLN
5.0000 mg | Freq: Once | INTRAVENOUS | Status: AC
  Administered 2023-04-02: 5 mg via INTRAVENOUS
  Filled 2023-04-02: qty 5

## 2023-04-02 MED ORDER — MAGNESIUM SULFATE 2 GM/50ML IV SOLN
2.0000 g | Freq: Once | INTRAVENOUS | Status: AC
  Administered 2023-04-02: 2 g via INTRAVENOUS
  Filled 2023-04-02: qty 50

## 2023-04-02 MED ORDER — POLYETHYLENE GLYCOL 3350 17 G PO PACK
17.0000 g | PACK | Freq: Two times a day (BID) | ORAL | Status: DC
  Administered 2023-04-02 – 2023-04-08 (×4): 17 g via ORAL
  Filled 2023-04-02 (×11): qty 1

## 2023-04-02 MED ORDER — SODIUM CHLORIDE 0.9 % IV SOLN
3.0000 g | Freq: Two times a day (BID) | INTRAVENOUS | Status: DC
  Administered 2023-04-02 – 2023-04-03 (×2): 3 g via INTRAVENOUS
  Filled 2023-04-02 (×2): qty 8

## 2023-04-02 MED ORDER — HEPARIN BOLUS VIA INFUSION
2500.0000 [IU] | Freq: Once | INTRAVENOUS | Status: AC
  Administered 2023-04-02: 2500 [IU] via INTRAVENOUS
  Filled 2023-04-02: qty 2500

## 2023-04-02 MED ORDER — NOREPINEPHRINE 4 MG/250ML-% IV SOLN
1.0000 ug/min | INTRAVENOUS | Status: DC
Start: 2023-04-02 — End: 2023-04-03
  Administered 2023-04-02: 1 ug/min via INTRAVENOUS
  Filled 2023-04-02: qty 250

## 2023-04-02 MED ORDER — HEPARIN (PORCINE) 25000 UT/250ML-% IV SOLN
1100.0000 [IU]/h | INTRAVENOUS | Status: DC
  Administered 2023-04-03 – 2023-04-04 (×2): 1100 [IU]/h via INTRAVENOUS
  Filled 2023-04-02 (×2): qty 250

## 2023-04-02 MED ORDER — NALOXONE HCL 0.4 MG/ML IJ SOLN
INTRAMUSCULAR | Status: AC
  Administered 2023-04-02: 0.4 mg via INTRAVENOUS
  Filled 2023-04-02: qty 1

## 2023-04-02 NOTE — Significant Event (Addendum)
Rapid Response Event Note   Reason for Call :  Brief episode of unresponsive after receiving 5mg  IV Lopressor.   Initial Focused Assessment:  Pt lying in bed, drowsy, oriented. Lung sounds are clear. Skin is pale, moist. Peripheral pulses 1+. Heart rate irregular, A Fib. Urinary incontinence.   VS: T 98.25F, BP 123/99, HR 80, SpO2 92% on 4LNC CBG: 145  BP 59/42, hypotension confirmed in multiple locations. 0.4mg  IV Narcan given at 1023 for symptomatic hypotension. Dilaudid last administered at 0922 0.5mg  IV.   Interventions:  -Narcan 0.4mg  IV  -2L IVF bolus -Levophed gtt  Plan of Care:  Transfer to ICU  Event Summary:  MD Notified: Dr. Jerral Ralph Call Time: [phone] Arrival Time: 0950 End Time: 1210  Jennye Moccasin, RN

## 2023-04-02 NOTE — Progress Notes (Signed)
Received patient from [facility] alert and oriented on levo, able to pull self up in bed. Patient comes from [facility] with eyeglasses, hearing aids, hearing aids charger, cell phone and cell phone charger. Family at bedside and will take patient's clothes home.

## 2023-04-02 NOTE — Discharge Instructions (Signed)
[facility]th[facility]edic [facility]uma Service Discharge Instructions   General Discharge Instructions  WEIGHT BEARING STATUS:Weightbearing as tolerated  RANGE OF MOTION/ACTIVITY: Ok for hip and knee motion as tolerated  Wound Care: Incisions can be left open to air if there is no drainage. Once the incision is completely dry and without drainage, it may be left open to air out.  Showering may begin post op day #3 (Thursday 04/03/23).  Clean incision gently with soap and water.  DVT/PE prophylaxis:  Eliquis  Diet: as you were eating previously.  Can use over the counter stool softeners and bowel preparations, such as Miralax, to help with bowel movements.  Narcotics can be constipating.  Be sure to drink plenty of fluids  PAIN MEDICATION USE AND EXPECTATIONS  You have likely been given narcotic medications to help control your pain.  After a traumatic event that results in an fracture (broken bone) with or without surgery, it is ok to use narcotic pain medications to help control one's pain.  We understand that everyone responds to pain differently and each individual patient will be evaluated on a regular basis for the continued need for narcotic medications. Ideally, narcotic medication use should last no more than 6-8 weeks (coinciding with fracture healing).   As a patient it is your responsibility as well to monitor narcotic medication use and report the amount and frequency you use these medications when you come to your office visit.   We would also advise that if you are using narcotic medications, you should take a dose prior to therapy to maximize you participation.  IF YOU ARE ON NARCOTIC MEDICATIONS IT IS NOT PERMISSIBLE TO OPERATE A MOTOR VEHICLE (MOTORCYCLE/CAR/TRUCK/MOPED) OR HEAVY MACHINERY DO NOT MIX NARCOTICS WITH OTHER CNS (CENTRAL NERVOUS SYSTEM) DEPRESSANTS SUCH AS ALCOHOL   STOP SMOKING OR USING NICOTINE PRODUCTS!!!!  As discussed nicotine severely impairs your body's ability to heal  surgical and traumatic wounds but also impairs bone healing.  Wounds and bone heal by forming microscopic blood vessels (angiogenesis) and nicotine is a vasoconstrictor (essentially, shrinks blood vessels).  Therefore, if vasoconstriction occurs to these microscopic blood vessels they essentially disappear and are unable to deliver necessary nutrients to the healing tissue.  This is one modifiable factor that you can do to dramatically increase your chances of healing your injury.    (This means no smoking, no nicotine gum, patches, etc)  DO NOT USE NONSTEROIDAL ANTI-INFLAMMATORY DRUGS (NSAID'S)  Using products such as Advil (ibuprofen), Aleve (naproxen), Motrin (ibuprofen) for additional pain control during fracture healing can delay and/or prevent the healing response.  If you would like to take over the counter (OTC) medication, Tylenol (acetaminophen) is ok.  However, some narcotic medications that are given for pain control contain acetaminophen as well. Therefore, you should not exceed more than 4000 mg of tylenol in a day if you do not have liver disease.  Also note that there are may OTC medicines, such as cold medicines and allergy medicines that my contain tylenol as well.  If you have any questions about medications and/or interactions please ask your doctor/PA or your pharmacist.      ICE AND ELEVATE INJURED/OPERATIVE EXTREMITY  Using ice and elevating the injured extremity above your heart can help with swelling and pain control.  Icing in a pulsatile fashion, such as 20 minutes on and 20 minutes off, can be followed.    Do not place ice directly on skin. Make sure there is a barrier between to skin and the  ice pack.    Using frozen items such as frozen peas works well as the conform nicely to the are that needs to be iced.  USE AN ACE WRAP OR TED HOSE FOR SWELLING CONTROL  In addition to icing and elevation, Ace wraps or TED hose are used to help limit and resolve swelling.  It is  recommended to use Ace wraps or TED hose until you are informed to stop.    When using Ace Wraps start the wrapping distally (farthest away from the body) and wrap proximally (closer to the body)   Example: If you had surgery on your leg or thing and you do not have a splint on, start the ace wrap at the toes and work your way up to the thigh        If you had surgery on your upper extremity and do not have a splint on, start the ace wrap at your fingers and work your way up to the upper arm   CALL THE OFFICE WITH ANY QUESTIONS OR CONCERNS: [phone]   VISIT OUR WEBSITE FOR ADDITIONAL INFORMATION: [email or web address]   Discharge Wound Care Instructions  Do NOT apply any ointments, solutions or lotions to pin sites or surgical wounds.  These prevent needed drainage and even though solutions like hydrogen peroxide kill bacteria, they also damage cells lining the pin sites that help fight infection.  Applying lotions or ointments can keep the wounds moist and can cause them to breakdown and open up as well. This can increase the risk for infection. When in doubt call the office.  Surgical incisions should be dressed daily.  If any drainage is noted, use one layer of adaptic or Mepitel, then gauze, Kerlix, and an ace wrap. - These dressing supplies should be available at local medical supply stores [facility], [facility], etc) as well as Insurance claims handler ([facility], [facility], [facility], etc)  Once the incision is completely dry and without drainage, it may be left open to air out.  Showering may begin 36-48 hours later.  Cleaning gently with soap and water.

## 2023-04-02 NOTE — Progress Notes (Addendum)
Presents after mechanical fall , suffered R femur fx        - s/p ORIF , 03/31/2023. Hx of f chronic atrial fibrillation /apixaban, CHF, BPH, GERD   04/02/23 0952  TOC Brief Assessment  Insurance and Status Reviewed  Patient has primary care physician Yes  Home environment has been reviewed From home, good family support  Prior level of function: PTA independent with ADL's  Prior/Current Home Services No current home services  Social Drivers of Health Review SDOH reviewed no interventions necessary  Readmission risk has been reviewed No  Transition of care needs transition of care needs identified, TOC will continue to follow (s/p ORIF on 03/31/2023)   Per PT evaluation / recommendations : SNF placement. TOC team following and will assist with needs..  2/12 1005 Pt with elevated HR (150's), MD @ bedside. Pt to transfer to progressive care unit.  Gae Gallop RN,BSN,CM [phone]

## 2023-04-02 NOTE — Progress Notes (Signed)
Orthopaedic Trauma Progress Note  SUBJECTIVE: Had an episode of unresponsiveness this AM. Unsure the cause, could be related to administration of morning Metoprolol. Patient also noted to take high dose of Seroquel at home which may be a contributing factor. Primary team at bedside currently.   Ortho exam deferred until later given recent events. Nursing staff at bedside stabilizing patient and drawing critical labs  OBJECTIVE:  Vitals:   04/02/23 0914 04/02/23 1015  BP: (!) 123/99 (!) 59/42  Pulse: 80   Resp:    Temp: 98.4 F (36.9 C)   SpO2: 92% 93%    IMAGING: Stable post op imaging.   LABS:  Results for orders placed or performed during the hospital encounter of 03/30/23 (from the past 24 hours)  CBC     Status: Abnormal   Collection Time: 04/02/23  6:02 AM  Result Value Ref Range   WBC 11.5 (H) 4.0 - 10.5 K/uL   RBC 2.83 (L) 4.22 - 5.81 MIL/uL   Hemoglobin 9.0 (L) 13.0 - 17.0 g/dL   HCT 16.1 (L) 09.6 - 04.5 %   MCV 96.5 80.0 - 100.0 fL   MCH 31.8 26.0 - 34.0 pg   MCHC 33.0 30.0 - 36.0 g/dL   RDW 40.9 81.1 - 91.4 %   Platelets 126 (L) 150 - 400 K/uL   nRBC 0.0 0.0 - 0.2 %    ASSESSMENT: Dustin [name] is a 83 y.o. male, 2 Days Post-Op s/p REMOVAL OF HARDWARE RIGHT FEMUR CEPHALOMEDULLARY NAILING RIGHT INTERTROCHANTERIC FEMUR FRACTURE  CV/Blood loss: Acute blood loss anemia, Hgb 9.0 this morning. Hemodynamically stable.   PLAN: Weightbearing: WBAT RLE Incisional and dressing care: Ok to remove dressings and leave incisions open to air Showering: Okay to begin showering and getting incisions wet 04/03/2023 Orthopedic device(s): None  Pain management: Continue current multimodal regimen VTE prophylaxis: Ok to start Eliquis from ortho standpoint.  SCDs ID:  Ancef 2gm post op completed Foley/Lines: No Foley.  KVO IVFs Impediments to Fracture Healing: Vitamin D level 65, no additional supplementation needed.   Dispo: Patient to transfer to progressive unit once bed  available. PT/OT evaluation to going, will need SNF.   Follow - up plan: 2 weeks after discharge for wound check and repeat x-rays  Contact information:  Truitt Merle MD, Ulyses Southward PA-C. After hours and holidays please check [email or web address] for group call information for [facility]   Donis Pinder A. Michaelyn Barter, PA-C [phone] (office) [email or web address]

## 2023-04-02 NOTE — Progress Notes (Signed)
PT Cancellation Note  Patient Details Name: Dustin [name] MRN: [id number] DOB: 10-10-1940   Cancelled Treatment:    Reason Eval/Treat Not Completed: Medical issues which prohibited therapy (RN stating pt HR in 150's at rest)  Lillia Pauls, PT, DPT Acute Rehabilitation Services Office [phone]    Norval Morton 04/02/2023, 10:02 AM

## 2023-04-02 NOTE — Progress Notes (Signed)
PHARMACY - ANTICOAGULATION CONSULT NOTE  Pharmacy Consult for heparin Indication: atrial fibrillation  Allergies  Allergen Reactions   Desmopressin Acetate Other (See Comments)    Hyponatremia    Zoloft [Sertraline] Other (See Comments)    Felt bad - Patient denies    Patient Measurements: Height: 6\' 2"  (188 cm) Weight: 100.3 kg (221 lb 1.9 oz) IBW/kg (Calculated) : 82.2 Heparin Dosing Weight: actual body weight  Vital Signs: Temp: 99.1 F (37.3 C) (02/12 1304) Temp Source: Axillary (02/12 1304) BP: 86/67 (02/12 1315) Pulse Rate: 96 (02/12 1315)  Labs: Recent Labs    03/31/23 0529 04/01/23 0539 04/02/23 0602 04/02/23 1103  HGB 12.6* 8.5* 9.0* 8.0*  HCT 37.1* 24.6* 27.3* 24.3*  PLT 93* 73* 126* 111*  APTT 57*  --   --   --   LABPROT 16.2*  --   --   --   INR 1.3*  --   --   --   CREATININE 1.45* 1.41*  --  2.51*    Estimated Creatinine Clearance: 29.2 mL/min (A) (by C-G formula based on SCr of 2.51 mg/dL (H)).   Medical History: Past Medical History:  Diagnosis Date   BPH (benign prostatic hyperplasia)    Chronic combined systolic and diastolic congestive heart failure (HCC)    GERD (gastroesophageal reflux disease)    protonix for control   Insomnia    Neck pain    Neuropathy     Medications:  Scheduled:   Chlorhexidine Gluconate Cloth  6 each Topical Daily   docusate sodium  100 mg Oral BID   DULoxetine  60 mg Oral Daily   ferrous sulfate  325 mg Oral Q breakfast   [START ON 04/03/2023] metoprolol tartrate  25 mg Oral BID   multivitamin with minerals  1 tablet Oral Daily   mupirocin ointment  1 Application Nasal BID   pantoprazole  40 mg Oral Daily   polyethylene glycol  17 g Oral BID   senna-docusate  2 tablet Oral BID   sodium chloride flush  3 mL Intravenous Q12H   [START ON 04/06/2023] Vitamin D (Ergocalciferol)  50,000 Units Oral Weekly   Infusions:   sodium chloride Stopped (04/01/23 2220)   ampicillin-sulbactam (UNASYN) IV     magnesium  sulfate bolus IVPB     norepinephrine (LEVOPHED) Adult infusion 3 mcg/min (04/02/23 1300)   sodium phosphate 15 mmol in sodium chloride 0.9 % 250 mL infusion      Assessment: Pt is an 83 yr old male with pmhx of chronic Afib on eliquis, Chronic combined systolic/diastolic CHF, GERD, and Insomnia who presented to [facility] ED on 2/12 due to a mechanical fall and right thigh/hip pain contributed from fall. Upon initial ED workup, patient was found to have a right intertrochanteric femoral neck fracture periprosthetic. Patient had a ORIF completed along with removal of hardware of the right femur by MD Haddix with orthopedics on 2/10. Patient progressing well until syncopal event on 2/12, leading to refractory shock now on norepinephrine.   Pharmacy has been consulted to initiate heparin, as orthopedics has cleared patient to restart anticoagulation for AF. Patient is in active atrial fibrillation. LFTs elevated, so holding home apixaban at this time. Will closely monitor H&H given post operative anemia.  Goal of Therapy:  Heparin level 0.3-0.7 units/ml Monitor platelets by anticoagulation protocol: Yes   Plan:  Give 2500 units bolus x 1 Start heparin infusion at 1400 units/hr Check anti-Xa level in 8 hours and daily while on heparin  Continue to monitor H&H and platelets  Louie Casa Leodan Bolyard 04/02/2023,2:10 PM

## 2023-04-02 NOTE — Consult Note (Signed)
NAME:  Dustin [name], MRN:  [id number], DOB:  1940/03/10, LOS: 3 ADMISSION DATE:  03/30/2023, CONSULTATION DATE:  2/12  REFERRING MD:  MD Ghimire  CHIEF COMPLAINT:  Mechanical fall  History of Present Illness:  Pt is an 83 yr old male with pmhx of chronic Afib on eliquis, Chronic combined systolic/diastolic CHF, GERD, and Insomnia who presented to [facility] ED on 2/12 due to a mechanical fall and right thigh/hip pain contributed from fall. Upon initial ED workup, patient was found to have a right intertrochanteric femoral neck fracture periprosthetic. Patient had a ORIF completed along with removal of hardware of the right femur by MD Haddix with orthopedics on 2/10. Patient was progressing until the morning of 2/12 when patient had a witness syncopal event while lying in bed post administration of 5mg  of metoprolol. Metoprolol was being given due to the concern of Afib RVR with HR per pulse ox monitor 150s-160s. During syncopal event, patient became pale, diaphoretic, and incontinent episode of urine. Patient was also hypoxic and hypotensive. A BVM was briefly used by [facility] attending, and patient become started to become responsive. Patient was placed on HFNC at 10L.  Rapid Response was called to beside, and began administering fluid boluses. Patient's hypotension continued despite fluid bolus resuscitation. Therefore, PCCM was consulted for refractory hypotension with initiation of peripheral levophed.    Pertinent  Medical History   Past Medical History:  Diagnosis Date   BPH (benign prostatic hyperplasia)    Chronic combined systolic and diastolic congestive heart failure (HCC)    GERD (gastroesophageal reflux disease)    protonix for control   Insomnia    Neck pain    Neuropathy      Significant Hospital Events: Including procedures, antibiotic start and stop dates in addition to other pertinent events   2/9 Admitted with mechanical fall- right intertrochanteric femoral neck fracture  periprosthetic 2/10 ORIF with removal of right femur hardware  2/12 Syncopal episode post metoprolol IV push, hypotension and hypoxia afterwards, PCCM consult for levophed   Interim History / Subjective:  Patient pale, following commands, receiving fluid bolus and IV pressor initiation   Objective   Blood pressure 104/68, pulse 80, temperature 98.5 F (36.9 C), temperature source Oral, resp. rate 17, height 6\' 2"  (1.88 m), weight 95.3 kg, SpO2 96%.        Intake/Output Summary (Last 24 hours) at 04/02/2023 1233 Last data filed at 04/02/2023 1200 Gross per 24 hour  Intake 2622.87 ml  Output --  Net 2622.87 ml   Filed Weights   03/30/23 1017  Weight: 95.3 kg    Examination: General: pleasant acute on chronic older male, lying in bed HENT: Normocephalic, PERRLA intact, poor dentition, pink MM Lungs: clear, diminished throughout, no respiratory distress, O2 sats 92%  Cardiovascular: s1,s2, irregular, afib rate controlled, no JVD, no MRG Abdomen: BS active, soft Extremities: moving all extremities, limited on right lower extremity s/p ORIF, no edema Neuro: AOx4, follows commands, hearing aids in place GU: deferred   Resolved Hospital Problem list   N/a   Assessment & Plan:  Hypotension post Syncopal episode  Suspect secondary to IV metoprolol push with sedating medications  In setting of anemia s/p ORIF on 2/10 Patient responding and AOx4 upon arrival to beside, post 2.5 L of normal saline, 0.4mg  of narcan and initiation of peripheral levophed  No signs of bleeding at right hip dressing MAP >65 ABG WNL Lactic Acid 2.2, WBC 8.8 Hgb 8.0  P: MAP goal >65  Hold off Seroquel, and Gabapentin Hold metoprolol 25mg  BID, restart on 2/12  DC IV dilaudid, use PO pain PRNs for pain management  Minimize IV pain medications due to hypotension  Repeat CBC this afternoon   Acute Hypoxic Respiratory Failure in setting of above On 2L [place] prior now on HFNC 10 Liters, O2 sats 92 to  93% PO2 on ABG was 102 Concern for aspiration  Chest x-ray post event-possible atelectasis/pna left lower base P: Wean O2 as tolerated, O2 Sat goal >92% IS and Flutter valve at beside Will start unasyn for aspiration coverage  Trend lactic acid, and WBC Intermittent chest x-ray  Mobilize when stable  Continue adequate pulmonary hygiene   Chronic Afib Chronic systolic/diastolic CHF Concern for afib RVR on pulse oximetry? But not verified with cardiac telemetry Metoprolol 5mg  IV given, then patient became syncopal  EKG with rate at 132, Afib RVR  Upon arrival to beside patient HR 110s, rate controlled Mag 1.7, phos 2.3  P:  Transfer to ICU for further cardiac monitoring while on pressors Will hold PO metoprolol dose due to setting of hypotension, resume tomorrow If patient proceeds into Afib RVR, would consider amio bolus with gtt  Replace electrolytes, Mag and Phos Continue to optimize electrolytes daily  Has received 2.5 Liters of N.S, hold off further boluses at this time  Utilize heparin gtt IV, hold eliquis for now due to renal function see below   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  Cr 2.51 increased from 1.56 (cr from admission) Suspect in setting of s/p ORIF and hypovolemia  P: Continue to trend renal function daily  Continue to monitor and optimize electrolytes daily Continue to monitor urine output Continue strict I/Os Continue Adequate renal perfusion  Avoid nephrotoxic agents  Hold eliquis due to renal function, start IV heparin gtt   Anemia secondary to s/p ORIF with removal of right femur hardware  Hgb 8 from 9, no signs of blood loss, suspect some of this decrease is dilutional  P: Repeat CBC at 1600 Monitor for signs of bleeding Transfuse for hgb < 7   ORIF with removal of right femur hardware P:  Ortho on board, continue following orders and recs DC IV dilaudid in setting of hypotension, -post op day 2,manage pain with oral prn pain meds  PT/OT  when appropriate   Best Practice (right click and "Reselect all SmartList Selections" daily)   Diet/type: Regular consistency (see orders) DVT prophylaxis systemic heparin Pressure ulcer(s): N/A GI prophylaxis: N/A Lines: N/A Foley:  N/A Code Status:  DNR Last date of multidisciplinary goals of care discussion [updated patient at beside on 2/12]   Labs   CBC: Recent Labs  Lab 03/30/23 1034 03/30/23 1742 03/31/23 0529 04/01/23 0539 04/02/23 0602 04/02/23 1103  WBC 5.0 9.0 9.2 7.5 11.5* 8.8  NEUTROABS 3.5  --   --   --   --  6.2  HGB 11.9* 12.9* 12.6* 8.5* 9.0* 8.0*  HCT 36.0* 38.9* 37.1* 24.6* 27.3* 24.3*  MCV 97.0 96.3 95.1 95.7 96.5 97.2  PLT 94* 98* 93* 73* 126* 111*    Basic Metabolic Panel: Recent Labs  Lab 03/30/23 1034 03/30/23 1742 03/31/23 0529 04/01/23 0539 04/02/23 1103  NA 141  --  137 136 133*  K 4.0  --  3.7 3.9 4.3  CL 105  --  97* 99 99  CO2 27  --  23 22 22   GLUCOSE 111*  --  101* 131* 156*  BUN 14  --  15 22 31*  CREATININE 1.51* 1.56* 1.45* 1.41* 2.51*  CALCIUM 8.7*  --  8.9 8.2* 7.7*  MG  --   --   --   --  1.7  PHOS  --   --   --   --  2.3*   GFR: Estimated Creatinine Clearance: 26.8 mL/min (A) (by C-G formula based on SCr of 2.51 mg/dL (H)). Recent Labs  Lab 03/31/23 0529 04/01/23 0539 04/02/23 0602 04/02/23 1103  WBC 9.2 7.5 11.5* 8.8    Liver Function Tests: Recent Labs  Lab 03/30/23 1034 04/02/23 1103  AST 29 458*  ALT 16 140*  ALKPHOS 89 61  BILITOT 0.9 0.9  PROT 6.5 5.3*  ALBUMIN 3.5 2.8*   No results for input(s): "LIPASE", "AMYLASE" in the last 168 hours. No results for input(s): "AMMONIA" in the last 168 hours.  ABG    Component Value Date/Time   PHART 7.43 04/02/2023 1009   PCO2ART 35 04/02/2023 1009   PO2ART 102 04/02/2023 1009   HCO3 23.1 04/02/2023 1009   TCO2 28 11/12/2022 2328   ACIDBASEDEF 0.5 04/02/2023 1009   O2SAT 99.8 04/02/2023 1009     Coagulation Profile: Recent Labs  Lab  03/30/23 1034 03/31/23 0529  INR 1.5* 1.3*    Cardiac Enzymes: No results for input(s): "CKTOTAL", "CKMB", "CKMBINDEX", "TROPONINI" in the last 168 hours.  HbA1C: Hgb A1c MFr Bld  Date/Time Value Ref Range Status  01/23/2016 04:14 AM 5.5 4.8 - 5.6 % Final    Comment:    (NOTE)         Pre-diabetes: 5.7 - 6.4         Diabetes: >6.4         Glycemic control for adults with diabetes: <7.0     CBG: Recent Labs  Lab 04/02/23 1133  GLUCAP 145*    Review of Systems:   See HPI   Past Medical History:  He,  has a past medical history of BPH (benign prostatic hyperplasia), Chronic combined systolic and diastolic congestive heart failure (HCC), GERD (gastroesophageal reflux disease), Insomnia, Neck pain, and Neuropathy.   Surgical History:   Past Surgical History:  Procedure Laterality Date   ANTERIOR CERVICAL DECOMPRESSION/DISCECTOMY FUSION 4 LEVELS N/A 10/27/2012   Procedure: Cervical Three-Four Cervical Four-Five Cervical Five-Six Cervical Six-Seven  Anterior cervical decompression/diskectomy/fusion;  Surgeon: Maeola Harman, MD;  Location: [facility] NEURO ORS;  Service: Neurosurgery;  Laterality: N/A;  Cervical Three-Four Cervical Four-Five Cervical Five-Six Cervical Six-Seven  Anterior cervical decompression/diskectomy/fusion   CARDIOVERSION N/A 09/20/2020   Procedure: CARDIOVERSION;  Surgeon: Little Ishikawa, MD;  Location: [facility] ENDOSCOPY;  Service: Cardiovascular;  Laterality: N/A;   FEMUR IM NAIL Right 03/08/2020   Procedure: INTRAMEDULLARY (IM) RETROGRADE FEMORAL NAILING;  Surgeon: Roby Lofts, MD;  Location: [facility] OR;  Service: Orthopedics;  Laterality: Right;   SHOULDER ARTHROSCOPY  2010   rt shoulder   SHOULDER ARTHROSCOPY  02/15/2011   Procedure: ARTHROSCOPY SHOULDER;  Surgeon: Erasmo Leventhal;  Location: [facility];  Service: Orthopedics;  Laterality: Right;  Debridement   TONSILLECTOMY       Social History:   reports that he quit smoking about 33  years ago. His smoking use included cigarettes. He has never used smokeless tobacco. He reports that he does not drink alcohol and does not use drugs.   Family History:  His family history includes Cirrhosis in his father; Emphysema in his mother; Stroke in his brother.   Allergies Allergies  Allergen Reactions  Desmopressin Acetate Other (See Comments)    Hyponatremia    Zoloft [Sertraline] Other (See Comments)    Felt bad - Patient denies     Home Medications  Prior to Admission medications   Medication Sig Start Date End Date Taking? Authorizing Provider  apixaban (ELIQUIS) 5 MG TABS tablet Take 5 mg by mouth 2 (two) times daily. 06/23/20  Yes [provider]  DENTA 5000 PLUS 1.1 % CREA dental cream Place 1 Application onto teeth in the morning and at bedtime. 03/06/23  Yes [provider]  DULoxetine (CYMBALTA) 60 MG capsule Take 60 mg by mouth daily. 05/08/22  Yes [provider]  FARXIGA 10 MG TABS tablet Take 10 mg by mouth daily. 10/10/21  Yes [provider]  ferrous sulfate 325 (65 FE) MG tablet Take 1 tablet (325 mg total) by mouth daily with breakfast. 06/14/22  Yes Azucena Fallen, MD  fluorouracil (EFUDEX) 5 % cream Apply 1 Application topically 2 (two) times daily. For left ear   Yes [provider]  gabapentin (NEURONTIN) 300 MG capsule Take 1 capsule (300 mg total) by mouth 3 (three) times daily. Patient taking differently: Take 600 mg by mouth 3 (three) times daily. 11/15/22  Yes Narda Bonds, MD  ibuprofen (ADVIL) 200 MG tablet Take 400 mg by mouth as needed for moderate pain.   Yes [provider]  metoprolol tartrate (LOPRESSOR) 25 MG tablet Take 25 mg by mouth 2 (two) times daily. 06/23/20  Yes [provider]  Multiple Vitamin (MULTIVITAMIN WITH MINERALS) TABS tablet Take 1 tablet by mouth daily. 03/18/20  Yes Myrtie Neither, MD  Oxycodone HCl 10 MG TABS Take 10 mg by mouth 2 (two) times daily as needed  (severe leg pain). 03/05/23  Yes [provider]  pantoprazole (PROTONIX) 40 MG tablet Take 1 tablet (40 mg total) by mouth daily. 03/18/20  Yes Myrtie Neither, MD  QUEtiapine (SEROQUEL) 300 MG tablet Take 600 mg by mouth at bedtime. 10/16/21  Yes [provider]  silodosin (RAPAFLO) 8 MG CAPS capsule Take 8 mg by mouth daily. 03/18/22  Yes [provider]  Vitamin D, Ergocalciferol, (DRISDOL) 1.25 MG (50000 UNIT) CAPS capsule Take 50,000 Units by mouth once a week. Thursdays 09/09/21  Yes [provider]     Critical care time: 45 mins    [facility]   [facility] 04/02/2023, 2:09 PM  Please see [email or web address] for pager details.  From 7A-7P if no response, please call [phone]. After hours, please call ELink [phone].

## 2023-04-02 NOTE — Progress Notes (Signed)
Report called to receiving nurse Ryan in ICU.

## 2023-04-02 NOTE — Care Management Important Message (Signed)
Important Message  Patient Details  Name: Dustin [name] MRN: [id number] Date of Birth: 01-11-1941   Important Message Given:  Yes - Medicare IM     Dorena Bodo 04/02/2023, 3:11 PM

## 2023-04-02 NOTE — Progress Notes (Signed)
PROGRESS NOTE    Dustin [name]  GNF:[id number] DOB: 11-22-1940 DOA: 03/30/2023 PCP: Darrow Bussing, MD    Brief Narrative:  83 year old with history of chronic A-fib on Eliquis, history of hip fracture with IM nail presented with mechanical fall and severe pain right thigh area.  In the emergency room he was found to have right intertrochanteric femoral neck fracture periprosthetic.  Admitted with surgical consultation. 2/12, acute events happened.  Patient had rapid A-fib, given 5 mg of metoprolol IV.  Also given his home medications and Dilaudid 0.5 mg.  Had transient unresponsiveness and hypotension.  Transferring to ICU.  Subjective:  Was called at the bedside because of persistent tachycardia.  Patient was fairly asymptomatic but very sleepy today.  His heart rate was 145 and was in A-fib.  Blood pressures were adequate.  Patient was given morning medications including metoprolol 25 mg.  Heart rate was persistently elevated and more than 135, blood pressures were adequate. Given metoprolol 5 mg push at the bedside under supervision.  Immediately following this, patient was more lethargic and had transient unresponsiveness with apneic spell as well as incontinent of urine.  He did not lose his pulse however he was apneic, 1 time bag and mask ventilation done and patient responded with feeble irregular pulse. Patient is sleepy but alert awake and oriented after resuscitation.  Blood pressure remained low and dropped to 65/37. 2 L isotonic fluid given with no response.  Heart rate responded to 110-113. Started on peripheral Levophed. Will transfer to ICU for close monitoring, ICU consulted.    Assessment & Plan:   Transient hypotension/hypovolemic shock/medication induced drowsiness or bradycardia: Patient on multiple sedating medications, gabapentin, received Seroquel 600 mg at night.  Given injectable Dilaudid.  Transient episode of unresponsiveness and probable bradycardia or sinus  arrest. Resuscitated. 2 L isotonic fluid given, sedating medications discontinued.  Started on peripheral Levophed and transferred to ICU. CBC, CMP, magnesium and phosphorus relatively normal. Chest x-ray unchanged.  Possible left lower lobe pneumonia. Lactic acid 2.2. Blood cultures collected. Blood gas analysis relatively stable, no much changes in oxygenation.  CO2 is 35. May treat as aspiration pneumonia, will communicate with critical care.  Closed traumatic intertrochanteric and periprosthetic fracture right femur: Status post ORIF Dr. Jena Gauss 2/10 Weightbearing as tolerated.  Eliquis was on hold.  Will put him back on Eliquis today as per orthopedics. Adequate pain medications.  PT OT and postoperative management as per surgery.  He will likely need SNF for rehab.  Persistent A-fib: Mostly chronic.  On Eliquis.  Rate controlled.  Episode as above.  Resume Eliquis as agreed by orthopedics.  Chronic medical issues  Severe peripheral neuropathy, anxiety depression: Will continue Cymbalta. On very high-dose Seroquel 600 mg at night, hold until clinical improvement.  Hold gabapentin today and resume lower doses once blood pressure responds well. Continue Requip 05 mg 3 times daily as needed.  Anemia of acute blood loss: Anticipated anemia due to long bone fracture.  Baseline hemoglobin about 11.  Hemoglobin 8. Fairly stable.  AKI on CKD stage IIIb: Baseline historic creatinine 1.4-1.7.  Creatinine 2.51 today.  Likely hemodynamic mediated due to low blood pressure.  Monitor urine output.  Resuscitated with fluid.  Recheck tomorrow morning.    DVT prophylaxis: SCDs Start: 03/31/23 1343 SCDs Start: 03/30/23 1309   Code Status: DNR with limited intervention.  Confirmed with patient again. Family Communication: None at bedside, called patient's daughter-in-law multiple times and unable to talk. Disposition Plan: Status is: Inpatient  Remains inpatient appropriate because: Transferring  to ICU.  Decompensated.     Consultants:  Orthopedics Critical care  Procedures:  ORIF right hip  Antimicrobials:  Perioperative     Objective: Vitals:   04/02/23 1155 04/02/23 1215 04/02/23 1229 04/02/23 1304  BP: 92/63  104/68   Pulse:      Resp:   17   Temp:    99.1 F (37.3 C)  TempSrc:    Axillary  SpO2: 96% 97% 96%   Weight:    100.3 kg  Height:        Intake/Output Summary (Last 24 hours) at 04/02/2023 1308 Last data filed at 04/02/2023 1200 Gross per 24 hour  Intake 2622.87 ml  Output --  Net 2622.87 ml   Filed Weights   03/30/23 1017 04/02/23 1304  Weight: 95.3 kg 100.3 kg    Examination:  After the acute events  general: Sleepy.  Tired and lethargic.  Looks fairly comfortable.  On 8 L of oxygen. Cardiovascular: S1-S2 normal.  Irregularly irregular.  Tachycardic. Respiratory: Bilateral clear.  No added sounds. Patient has ecchymosis on his left side of the chest from previous  injury.  Nontender. Gastrointestinal: Soft.  Nontender bowel sound present. Ext: Right lateral thigh incision clean and dry.  Edematous.  Edema extending to the knees.  Distal neurovascular status intact. Neuro: Alert awake and oriented.  Sleepy and tired today.  Otherwise very pleasant interaction.      Data Reviewed: I have personally reviewed following labs and imaging studies  CBC: Recent Labs  Lab 03/30/23 1034 03/30/23 1742 03/31/23 0529 04/01/23 0539 04/02/23 0602 04/02/23 1103  WBC 5.0 9.0 9.2 7.5 11.5* 8.8  NEUTROABS 3.5  --   --   --   --  6.2  HGB 11.9* 12.9* 12.6* 8.5* 9.0* 8.0*  HCT 36.0* 38.9* 37.1* 24.6* 27.3* 24.3*  MCV 97.0 96.3 95.1 95.7 96.5 97.2  PLT 94* 98* 93* 73* 126* 111*   Basic Metabolic Panel: Recent Labs  Lab 03/30/23 1034 03/30/23 1742 03/31/23 0529 04/01/23 0539 04/02/23 1103  NA 141  --  137 136 133*  K 4.0  --  3.7 3.9 4.3  CL 105  --  97* 99 99  CO2 27  --  23 22 22   GLUCOSE 111*  --  101* 131* 156*  BUN 14  --  15  22 31*  CREATININE 1.51* 1.56* 1.45* 1.41* 2.51*  CALCIUM 8.7*  --  8.9 8.2* 7.7*  MG  --   --   --   --  1.7  PHOS  --   --   --   --  2.3*   GFR: Estimated Creatinine Clearance: 29.2 mL/min (A) (by C-G formula based on SCr of 2.51 mg/dL (H)). Liver Function Tests: Recent Labs  Lab 03/30/23 1034 04/02/23 1103  AST 29 458*  ALT 16 140*  ALKPHOS 89 61  BILITOT 0.9 0.9  PROT 6.5 5.3*  ALBUMIN 3.5 2.8*   No results for input(s): "LIPASE", "AMYLASE" in the last 168 hours. No results for input(s): "AMMONIA" in the last 168 hours. Coagulation Profile: Recent Labs  Lab 03/30/23 1034 03/31/23 0529  INR 1.5* 1.3*   Cardiac Enzymes: No results for input(s): "CKTOTAL", "CKMB", "CKMBINDEX", "TROPONINI" in the last 168 hours. BNP (last 3 results) No results for input(s): "PROBNP" in the last 8760 hours. HbA1C: No results for input(s): "HGBA1C" in the last 72 hours. CBG: Recent Labs  Lab 04/02/23 1133 04/02/23 1252  GLUCAP 145*  145*   Lipid Profile: No results for input(s): "CHOL", "HDL", "LDLCALC", "TRIG", "CHOLHDL", "LDLDIRECT" in the last 72 hours. Thyroid Function Tests: No results for input(s): "TSH", "T4TOTAL", "FREET4", "T3FREE", "THYROIDAB" in the last 72 hours. Anemia Panel: No results for input(s): "VITAMINB12", "FOLATE", "FERRITIN", "TIBC", "IRON", "RETICCTPCT" in the last 72 hours. Sepsis Labs: Recent Labs  Lab 04/02/23 1218  LATICACIDVEN 2.2*    Recent Results (from the past 240 hours)  Surgical PCR screen     Status: None   Collection Time: 03/31/23  5:00 AM   Specimen: Nasal Mucosa; Nasal Swab  Result Value Ref Range Status   MRSA, PCR NEGATIVE NEGATIVE Final   Staphylococcus aureus NEGATIVE NEGATIVE Final    Comment: (NOTE) The Xpert SA Assay (FDA approved for NASAL specimens in patients 71 years of age and older), is one component of a comprehensive surveillance program. It is not intended to diagnose infection nor to guide or monitor  treatment. Performed at [facility], [address], [place], [place] [address]          Radiology Studies: DG CHEST PORT 1 VIEW Result Date: 04/02/2023 CLINICAL DATA:  Hypoxia EXAM: PORTABLE CHEST 1 VIEW COMPARISON:  03/30/2023 FINDINGS: Cardiomegaly. Coarsened interstitial markings bilaterally. Increasing interstitial opacity at the left lung base. No pleural effusion or pneumothorax. IMPRESSION: Increasing interstitial opacity at the left lung base, which may represent atelectasis or pneumonia. Electronically Signed   By: Duanne Guess D.O.   On: 04/02/2023 12:42   DG FEMUR PORT, MIN 2 VIEWS RIGHT Result Date: 03/31/2023 CLINICAL DATA:  Operative fixation of a right intertrochanteric fracture. EXAM: RIGHT FEMUR PORTABLE 2 VIEW COMPARISON:  03/30/2023 and C-arm images obtained earlier today. FINDINGS: Interval compression screw and rod fixation of the recently demonstrated right intertrochanteric fracture with satisfactory position and alignment. Old, healed distal right femur fracture. The previous distal femur fixation hardware has been removed. No new fracture or dislocation. IMPRESSION: Interval compression screw and rod fixation of the recently demonstrated right intertrochanteric fracture with satisfactory position and alignment. Electronically Signed   By: Beckie Salts M.D.   On: 03/31/2023 17:20        Scheduled Meds:  docusate sodium  100 mg Oral BID   DULoxetine  60 mg Oral Daily   ferrous sulfate  325 mg Oral Q breakfast   metoprolol tartrate  25 mg Oral BID   multivitamin with minerals  1 tablet Oral Daily   mupirocin ointment  1 Application Nasal BID   pantoprazole  40 mg Oral Daily   polyethylene glycol  17 g Oral BID   senna-docusate  2 tablet Oral BID   sodium chloride flush  3 mL Intravenous Q12H   [START ON 04/06/2023] Vitamin D (Ergocalciferol)  50,000 Units Oral Weekly   Continuous Infusions:  sodium chloride 40 mL/hr at 04/01/23 1512    norepinephrine (LEVOPHED) Adult infusion 2 mcg/min (04/02/23 1200)     LOS: 3 days    Time spent: 70 minutes    Dorcas Carrow, MD [facility]

## 2023-04-02 NOTE — Progress Notes (Signed)
PHARMACY - ANTICOAGULATION CONSULT NOTE  Pharmacy Consult for heparin Indication: atrial fibrillation  Allergies  Allergen Reactions   Desmopressin Acetate Other (See Comments)    Hyponatremia    Zoloft [Sertraline] Other (See Comments)    Felt bad - Patient denies    Patient Measurements: Height: 6\' 2"  (188 cm) Weight: 100.3 kg (221 lb 1.9 oz) IBW/kg (Calculated) : 82.2 Heparin Dosing Weight: actual body weight  Vital Signs: Temp: 98 F (36.7 C) (02/12 1947) Temp Source: Axillary (02/12 1947) BP: 97/53 (02/12 2100) Pulse Rate: 88 (02/12 2100)  Labs: Recent Labs    03/31/23 0529 04/01/23 0539 04/02/23 0602 04/02/23 1103 04/02/23 1552 04/02/23 2114  HGB 12.6* 8.5* 9.0* 8.0* 7.9*  --   HCT 37.1* 24.6* 27.3* 24.3* 24.5*  --   PLT 93* 73* 126* 111* 116*  --   APTT 57*  --   --   --   --   --   LABPROT 16.2*  --   --   --   --   --   INR 1.3*  --   --   --   --   --   HEPARINUNFRC  --   --   --   --   --  0.93*  CREATININE 1.45* 1.41*  --  2.51*  --   --     Estimated Creatinine Clearance: 29.2 mL/min (A) (by C-G formula based on SCr of 2.51 mg/dL (H)).   Medical History: Past Medical History:  Diagnosis Date   BPH (benign prostatic hyperplasia)    Chronic combined systolic and diastolic congestive heart failure (HCC)    GERD (gastroesophageal reflux disease)    protonix for control   Insomnia    Neck pain    Neuropathy     Medications:  Scheduled:   sodium chloride   Intravenous Once   Chlorhexidine Gluconate Cloth  6 each Topical Daily   docusate sodium  100 mg Oral BID   DULoxetine  60 mg Oral Daily   ferrous sulfate  325 mg Oral Q breakfast   [START ON 04/03/2023] metoprolol tartrate  25 mg Oral BID   multivitamin with minerals  1 tablet Oral Daily   mupirocin ointment  1 Application Nasal BID   pantoprazole  40 mg Oral Daily   polyethylene glycol  17 g Oral BID   senna-docusate  2 tablet Oral BID   sodium chloride flush  3 mL Intravenous Q12H    [START ON 04/06/2023] Vitamin D (Ergocalciferol)  50,000 Units Oral Weekly   Infusions:   sodium chloride Stopped (04/01/23 2220)   ampicillin-sulbactam (UNASYN) IV Stopped (04/02/23 1513)   heparin 1,400 Units/hr (04/02/23 2100)   norepinephrine (LEVOPHED) Adult infusion Stopped (04/02/23 1638)    Assessment: Pt is an 83 yr old male with pmhx of chronic Afib on eliquis, Chronic combined systolic/diastolic CHF, GERD, and Insomnia who presented to [facility] ED on 2/12 due to a mechanical fall and right thigh/hip pain contributed from fall. Upon initial ED workup, patient was found to have a right intertrochanteric femoral neck fracture periprosthetic. Patient had a ORIF completed along with removal of hardware of the right femur by MD Haddix with orthopedics on 2/10. Patient progressing well until syncopal event on 2/12, leading to refractory shock now on norepinephrine.   Pharmacy has been consulted to initiate heparin, as orthopedics has cleared patient to restart anticoagulation for AF. Patient is in active atrial fibrillation. LFTs elevated, so holding home apixaban at this time. Will  closely monitor H&H given post operative anemia.  Heparin level tonight (6 hr level) elevated at 0.93, on heparin infusion at 1400 units/hr. No s/sx of bleeding or infusion issues. Drawn from opposite arm.   Goal of Therapy:  Heparin level 0.3-0.7 units/ml Monitor platelets by anticoagulation protocol: Yes   Plan:  Reduce heparin infusion to 1200 units/hr Check anti-Xa level in 8 hours and daily while on heparin Continue to monitor H&H and platelets  Thank you for allowing pharmacy to participate in this patient's care,  Sherron Monday, PharmD, BCCCP Clinical Pharmacist  Phone: [phone] 04/02/2023 9:59 PM  Please check AMION for all [facility] Pharmacy phone numbers After 10:00 PM, call Main Pharmacy [phone]

## 2023-04-02 NOTE — Plan of Care (Signed)
  Problem: Education: Goal: Knowledge of General Education information will improve Description: Including pain rating scale, medication(s)/side effects and non-pharmacologic comfort measures Outcome: Progressing   Problem: Clinical Measurements: Goal: Will remain free from infection Outcome: Progressing Goal: Respiratory complications will improve Outcome: Progressing   Problem: Nutrition: Goal: Adequate nutrition will be maintained Outcome: Progressing   Problem: Coping: Goal: Level of anxiety will decrease Outcome: Progressing   Problem: Elimination: Goal: Will not experience complications related to bowel motility Outcome: Progressing   Problem: Pain Managment: Goal: General experience of comfort will improve and/or be controlled Outcome: Progressing   Problem: Safety: Goal: Ability to remain free from injury will improve Outcome: Progressing   Problem: Skin Integrity: Goal: Risk for impaired skin integrity will decrease Outcome: Progressing

## 2023-04-03 DIAGNOSIS — I4891 Unspecified atrial fibrillation: Secondary | ICD-10-CM

## 2023-04-03 DIAGNOSIS — J9601 Acute respiratory failure with hypoxia: Secondary | ICD-10-CM

## 2023-04-03 DIAGNOSIS — S72001S Fracture of unspecified part of neck of right femur, sequela: Secondary | ICD-10-CM

## 2023-04-03 DIAGNOSIS — R571 Hypovolemic shock: Secondary | ICD-10-CM | POA: Diagnosis not present

## 2023-04-03 LAB — CBC
HCT: 25.1 % — ABNORMAL LOW (ref 39.0–52.0)
Hemoglobin: 8.3 g/dL — ABNORMAL LOW (ref 13.0–17.0)
MCH: 31.4 pg (ref 26.0–34.0)
MCHC: 33.1 g/dL (ref 30.0–36.0)
MCV: 95.1 fL (ref 80.0–100.0)
Platelets: 111 10*3/uL — ABNORMAL LOW (ref 150–400)
RBC: 2.64 MIL/uL — ABNORMAL LOW (ref 4.22–5.81)
RDW: 14.6 % (ref 11.5–15.5)
WBC: 9.4 10*3/uL (ref 4.0–10.5)
nRBC: 0 % (ref 0.0–0.2)

## 2023-04-03 LAB — HEPATIC FUNCTION PANEL
ALT: 104 U/L — ABNORMAL HIGH (ref 0–44)
AST: 444 U/L — ABNORMAL HIGH (ref 15–41)
Albumin: 2.8 g/dL — ABNORMAL LOW (ref 3.5–5.0)
Alkaline Phosphatase: 64 U/L (ref 38–126)
Bilirubin, Direct: 0.3 mg/dL — ABNORMAL HIGH (ref 0.0–0.2)
Indirect Bilirubin: 0.7 mg/dL (ref 0.3–0.9)
Total Bilirubin: 1 mg/dL (ref 0.0–1.2)
Total Protein: 5.4 g/dL — ABNORMAL LOW (ref 6.5–8.1)

## 2023-04-03 LAB — MAGNESIUM: Magnesium: 2.1 mg/dL (ref 1.7–2.4)

## 2023-04-03 LAB — BASIC METABOLIC PANEL
Anion gap: 8 (ref 5–15)
BUN: 30 mg/dL — ABNORMAL HIGH (ref 8–23)
CO2: 26 mmol/L (ref 22–32)
Calcium: 7.8 mg/dL — ABNORMAL LOW (ref 8.9–10.3)
Chloride: 103 mmol/L (ref 98–111)
Creatinine, Ser: 2.03 mg/dL — ABNORMAL HIGH (ref 0.61–1.24)
GFR, Estimated: 32 mL/min — ABNORMAL LOW (ref >60)
Glucose, Bld: 130 mg/dL — ABNORMAL HIGH (ref 70–99)
Potassium: 4.2 mmol/L (ref 3.5–5.1)
Sodium: 137 mmol/L (ref 135–145)

## 2023-04-03 LAB — PHOSPHORUS: Phosphorus: 2.5 mg/dL (ref 2.5–4.6)

## 2023-04-03 LAB — PREPARE RBC (CROSSMATCH)

## 2023-04-03 LAB — HEPARIN LEVEL (UNFRACTIONATED): Heparin Unfractionated: 0.72 [IU]/mL — ABNORMAL HIGH (ref 0.30–0.70)

## 2023-04-03 MED ORDER — QUETIAPINE FUMARATE 100 MG PO TABS: 600.0000 mg | ORAL_TABLET | Freq: Every day | ORAL | Status: DC

## 2023-04-03 MED ORDER — GLYCERIN (LAXATIVE) 2 G RE SUPP
1.0000 | Freq: Once | RECTAL | Status: AC
  Administered 2023-04-03: 1 via RECTAL
  Filled 2023-04-03: qty 1

## 2023-04-03 MED ORDER — FUROSEMIDE 10 MG/ML IJ SOLN
20.0000 mg | Freq: Once | INTRAMUSCULAR | Status: AC
  Administered 2023-04-03: 20 mg via INTRAVENOUS
  Filled 2023-04-03: qty 2

## 2023-04-03 MED ORDER — QUETIAPINE FUMARATE 100 MG PO TABS
300.0000 mg | ORAL_TABLET | Freq: Every day | ORAL | Status: DC
  Administered 2023-04-03 – 2023-04-07 (×5): 300 mg via ORAL
  Filled 2023-04-03 (×5): qty 3

## 2023-04-03 MED ORDER — AMOXICILLIN-POT CLAVULANATE 875-125 MG PO TABS
1.0000 | ORAL_TABLET | Freq: Two times a day (BID) | ORAL | Status: AC
  Administered 2023-04-03 – 2023-04-07 (×8): 1 via ORAL
  Filled 2023-04-03 (×9): qty 1

## 2023-04-03 MED ORDER — SODIUM CHLORIDE 0.9% IV SOLUTION: Freq: Once | INTRAVENOUS | Status: AC

## 2023-04-03 NOTE — Plan of Care (Signed)

## 2023-04-03 NOTE — Progress Notes (Signed)
Patient transferred to 5N with all belongings-- eyeglasses, hearing aids, hearing aids charger, cell phone and cell phone charger.

## 2023-04-03 NOTE — Progress Notes (Signed)
Physical Therapy Treatment Patient Details Name: Dustin [name] MRN: [id number] DOB: 14-Dec-1940 Today's Date: 04/03/2023   History of Present Illness Pt is an 83 y/o male who presents s/p Fall, sustaining a R intertrochanteric femoral neck periprosthetic fracture. He is now s/p ORIF on 03/31/2023. PMH significant for BPH, CHF, neuropathy, 4 level ACDF 2014, R IM nail 2022.    PT Comments  Progressing towards acute rehab goals. Pre-medicated. Able to stand with mod assist to rise from elevated bed surface and heavy support on RW. Up to max assist with bed mobility; using bed sheet to support RLE, in addition to therapist's help. Very slow and guarded, stopping frequently to rest. Pre-gait activity in standing, weight shifting with slight WB tolerated through RLE. Educated on proper use of RW to unload as needed. Tachycardic into 130s when mobilizing otherwise VSS. Reviewed multiple LE exercises and encouraged to perform multiple times/day. Patient will continue to benefit from skilled physical therapy services to further improve independence with functional mobility. Patient will benefit from continued inpatient follow up therapy, <3 hours/day.    If plan is discharge home, recommend the following: Two people to help with walking and/or transfers;Two people to help with bathing/dressing/bathroom;Assistance with cooking/housework;Assist for transportation;Help with stairs or ramp for entrance   Can travel by private vehicle     No  Equipment Recommendations  None recommended by PT    Recommendations for Other Services       Precautions / Restrictions Precautions Precautions: Fall Restrictions Weight Bearing Restrictions Per Provider Order: No RLE Weight Bearing Per Provider Order: Weight bearing as tolerated     Mobility  Bed Mobility Overal bed mobility: Needs Assistance Bed Mobility: Supine to Sit, Sit to Supine     Supine to sit: Mod assist, HOB elevated, Used rails Sit to  supine: Max assist, Used rails   General bed mobility comments: Mod assist using bed sheet to support RLE with additional assist from PT to support, and guide trunk and bed pad to turn to seated position on EOB. Max assist for RLE and trunk to return to supine, able to lift LLE into bed slowly. Very slow, very guarded and painful despite being premedicated.    Transfers Overall transfer level: Needs assistance Equipment used: Rolling walker (2 wheels) Transfers: Sit to/from Stand Sit to Stand: From elevated surface, Mod assist           General transfer comment: Heavy mod assist for boost to stand x2 from elevated bed surface. Rt hand on RW, Lt hand pushing from bed. Cues for technique, supported with transition to grasp RW, minimal to no WB tolerated through RLE until standing fully upright. Performed lateral scoot along bed towards Rt with Mod assist, and heavy cues for technique.    Ambulation/Gait             Pre-gait activities: Weight shift Lt and Rt in RW, tolerating some WB through RLE. General Gait Details: Unable at this time.   Stairs             Wheelchair Mobility     Tilt Bed    Modified Rankin (Stroke Patients Only)       Balance Overall balance assessment: Needs assistance Sitting-balance support: Feet supported, No upper extremity supported Sitting balance-Leahy Scale: Fair     Standing balance support: Bilateral upper extremity supported, During functional activity, Reliant on assistive device for balance Standing balance-Leahy Scale: Poor  Communication Communication Communication: Impaired Factors Affecting Communication: Hearing impaired (hearing aids)  Cognition Arousal: Alert Behavior During Therapy: WFL for tasks assessed/performed   PT - Cognitive impairments: No apparent impairments                         Following commands: Intact      Cueing Cueing Techniques: Verbal  cues, Gestural cues  Exercises General Exercises - Lower Extremity Ankle Circles/Pumps: AROM, Both, 20 reps, Supine Quad Sets: Strengthening, Both, Supine, 15 reps Gluteal Sets: Strengthening, Both, 10 reps, Supine Heel Slides: 10 reps (minimal AROM) Hip ABduction/ADduction: Strengthening, Both, 10 reps, Supine    General Comments General comments (skin integrity, edema, etc.): HR increased to 130s when straining with effort to stand/mobilize. SpO2 90% and greater on 2L supplemental O2.      Pertinent Vitals/Pain Pain Assessment Pain Assessment: Faces Faces Pain Scale: Hurts whole lot Pain Location: R hip with movement Pain Descriptors / Indicators: Operative site guarding, Sore, Grimacing, Moaning Pain Intervention(s): Limited activity within patient's tolerance, Monitored during session, Premedicated before session, Repositioned    Home Living                          Prior Function            PT Goals (current goals can now be found in the care plan section) Acute Rehab PT Goals Patient Stated Goal: Home at d/c PT Goal Formulation: With patient/family Time For Goal Achievement: 04/15/23 Potential to Achieve Goals: Good Progress towards PT goals: Progressing toward goals    Frequency    Min 1X/week      PT Plan      Co-evaluation              AM-PAC PT "6 Clicks" Mobility   Outcome Measure  Help needed turning from your back to your side while in a flat bed without using bedrails?: A Lot Help needed moving from lying on your back to sitting on the side of a flat bed without using bedrails?: A Lot Help needed moving to and from a bed to a chair (including a wheelchair)?: Total Help needed standing up from a chair using your arms (e.g., wheelchair or bedside chair)?: A Lot Help needed to walk in hospital room?: Total Help needed climbing 3-5 steps with a railing? : Total 6 Click Score: 9    End of Session Equipment Utilized During Treatment:  Gait belt Activity Tolerance: Patient tolerated treatment well;Patient limited by pain Patient left: with call bell/phone within reach;in bed;with bed alarm set;with SCD's reapplied Nurse Communication: Mobility status PT Visit Diagnosis: Unsteadiness on feet (R26.81);Pain;History of falling (Z91.81);Difficulty in walking, not elsewhere classified (R26.2) Pain - Right/Left: Right Pain - part of body: Hip     Time: 1308-6578 PT Time Calculation (min) (ACUTE ONLY): 29 min  Charges:    $Therapeutic Exercise: 8-22 mins $Therapeutic Activity: 8-22 mins PT General Charges $$ ACUTE PT VISIT: 1 Visit                     Kathlyn Sacramento, PT, DPT [facility]  Rehabilitation Services Physical Therapist Office: [phone] Website: [email or web address]    Berton Mount 04/03/2023, 4:46 PM

## 2023-04-03 NOTE — Progress Notes (Addendum)
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 2/12 he developed A Fib with RVR and was given metoprolol after receiving pain medicine which resulted in hypotensive episode with hypoxia. Rapid response was called. Patient was given oxygen via bag mask valve and started on IV fluid boluses with improvement in his condition and mentation. He was started on peripheral levophed as well.   PCCM consulted for further management.   Pertinent  Medical History  chronic Afib on eliquis, Chronic combined systolic/diastolic CHF, GERD, insomnia,   Significant Hospital Events: Including procedures, antibiotic start and stop dates in addition to other pertinent events   2/9 Admitted with mechanical fall- right intertrochanteric femoral neck fracture periprosthetic 2/10 ORIF with removal of right femur hardware  2/12 Syncopal episode post metoprolol IV push, hypotension and hypoxia afterwards, PCCM consult for levophed 2/13 Transitioned Unasyn to PO Augmentin 875-125 mg BID x 4 days,   Interim History / Subjective:  Laying in bed, on 6 L oxygen via [place]. AO x 4. Reports R hip pain. Denied any chest pain or SOB. Denied dizziness or lightheadedness.   Objective   Blood pressure 125/82, pulse (!) 115, temperature 98.4 F (36.9 C), temperature source Axillary, resp. rate 12, height 6\' 2"  (1.88 m), weight 100.3 kg, SpO2 99%.        Intake/Output Summary (Last 24 hours) at 04/03/2023 1401 Last data filed at 04/03/2023 1315 Gross per 24 hour  Intake 1415 ml  Output 2035 ml  Net -620 ml   Filed Weights   03/30/23 1017  04/02/23 1304  Weight: 95.3 kg 100.3 kg    Examination: General: Appears ill, Laying in bed with head of bed elevated, no acute distress  HENT: Normocephalic and atraumatic  Lungs: Clear with minimal decrease breath sounds bibasilar  Cardiovascular: tachycardiac, irregular rhythm Abdomen: soft, NT, ND. Bowel sounds present  Extremities: warm. Limited RLE ROM s/p ORIF. SCDs Neuro: AO x 4.  GU: not assessed.   CXR 2/12: Increasing interstitial opacity at the left lung base, which may represent atelectasis or pneumonia.  Resolved Hospital Problem list   N/A  Assessment & Plan:  Shock, likely hypovolemic and distributive shock Acute Hypoxic Respiratory failure   Possible aspiration pneumonia Likely secondary to IV metoprolol with addition of other sedating medications ( Seroquel, gabapentin), opioids and anemia s/p ORIF 2/10. CXR 2/12 findings of interstitial opacities LL base. Concern for aspiration during the hypotensive event, started on Unasyn 2/12.  S/p 2.5 L fluid resuscitations. Off levophed 2/12 evening, maintaining MAP > 65  - Transition from Unasyn to PO Augmentin 875-125 mg BID x 4 days  - IV lasix 20 mg with 1 PRBC  - Wean O2 as tolerated, O2 Sat goal >92%   Chronic Afib Chronic systolic/diastolic CHF [LVEF 50-55%] - Heparin gtt IV until LFTs improve, then plan to transition to Lovenox   - Start metoprolol 25mg  BID  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  Cr 2.03 (2.51) Baseline historic creatinine [1.4-1.7], improving.  - Trend Cr  Normocytic Anemia Hgb 8.3 (7.9), baseline Hgb 11, likely post surgical. Received  1 PRBC 2/12. Currently no signs of blood loss  - 1 PRBC today  - Trend Hgb   Right periprosthetic intertrochanteric femur fracture  S/p ORIF with removal of right femur hardware 2/10 Ortho on board, continue following orders and recs - Oxy 5-10 mg q4h for pain  - PT/OT when appropriate  - LBM 2/9, bowel regimen consist of Colace, Miralax, and senna.  Will add glycerin suppository today   Severe peripheral neuropathy Hx of anxiety and depression  - Start with low dose Seroquel 300 mg at bedtime, adjust as BP tolerates  - Hold Gabapentin and Cymbalta (elevated LFTs)   Best Practice (right click and "Reselect all SmartList Selections" daily)   Diet/type: Heart Healthy  DVT prophylaxis prophylactic heparin  Pressure ulcer(s): Assessed by RN GI prophylaxis: PPI Lines: Peripherals  Foley:  foley catheter  Code Status:  DNR Last date of multidisciplinary goals of care discussion [--]  Labs   CBC: Recent Labs  Lab 03/30/23 1034 03/30/23 1742 04/01/23 0539 04/02/23 0602 04/02/23 1103 04/02/23 1552 04/03/23 0309  WBC 5.0   < > 7.5 11.5* 8.8 11.8* 9.4  NEUTROABS 3.5  --   --   --  6.2  --   --   HGB 11.9*   < > 8.5* 9.0* 8.0* 7.9* 8.3*  HCT 36.0*   < > 24.6* 27.3* 24.3* 24.5* 25.1*  MCV 97.0   < > 95.7 96.5 97.2 97.6 95.1  PLT 94*   < > 73* 126* 111* 116* 111*   < > = values in this interval not displayed.    Basic Metabolic Panel: Recent Labs  Lab 03/30/23 1034 03/30/23 1742 03/31/23 0529 04/01/23 0539 04/02/23 1103 04/03/23 0309  NA 141  --  137 136 133* 137  K 4.0  --  3.7 3.9 4.3 4.2  CL 105  --  97* 99 99 103  CO2 27  --  23 22 22 26   GLUCOSE 111*  --  101* 131* 156* 130*  BUN 14  --  15 22 31* 30*  CREATININE 1.51* 1.56* 1.45* 1.41* 2.51* 2.03*  CALCIUM 8.7*  --  8.9 8.2* 7.7* 7.8*  MG  --   --   --   --  1.7 2.1  PHOS  --   --   --   --  2.3* 2.5   GFR: Estimated Creatinine Clearance: 36.1 mL/min (A) (by C-G formula based on SCr of 2.03 mg/dL (H)). Recent Labs  Lab 04/02/23 0602 04/02/23 1103 04/02/23 1218 04/02/23 1552 04/03/23 0309  WBC 11.5* 8.8  --  11.8* 9.4  LATICACIDVEN  --   --  2.2* 1.8  --     Liver Function Tests: Recent Labs  Lab 03/30/23 1034 04/02/23 1103 04/03/23 0309  AST 29 458* 444*  ALT 16 140* 104*  ALKPHOS 89 61 64  BILITOT 0.9 0.9 1.0  PROT 6.5 5.3* 5.4*  ALBUMIN  3.5 2.8* 2.8*   No results for input(s): "LIPASE", "AMYLASE" in the last 168 hours. No results for input(s): "AMMONIA" in the last 168 hours.  ABG    Component Value Date/Time   PHART 7.43 04/02/2023 1009   PCO2ART 35 04/02/2023 1009   PO2ART 102 04/02/2023 1009   HCO3 23.1 04/02/2023 1009   TCO2 28 11/12/2022 2328   ACIDBASEDEF 0.5 04/02/2023 1009   O2SAT 99.8 04/02/2023 1009     Coagulation Profile: Recent Labs  Lab 03/30/23 1034 03/31/23 0529  INR 1.5* 1.3*    Cardiac  Enzymes: No results for input(s): "CKTOTAL", "CKMB", "CKMBINDEX", "TROPONINI" in the last 168 hours.  HbA1C: Hgb A1c MFr Bld  Date/Time Value Ref Range Status  01/23/2016 04:14 AM 5.5 4.8 - 5.6 % Final    Comment:    (NOTE)         Pre-diabetes: 5.7 - 6.4         Diabetes: >6.4         Glycemic control for adults with diabetes: <7.0     CBG: Recent Labs  Lab 04/02/23 1133 04/02/23 1252  GLUCAP 145* 145*    Review of Systems:   Reports R hip pain. Denied any chest pain or SOB. Denied dizziness or lightheadedness.   Past Medical History:  He,  has a past medical history of BPH (benign prostatic hyperplasia), Chronic combined systolic and diastolic congestive heart failure (HCC), GERD (gastroesophageal reflux disease), Insomnia, Neck pain, and Neuropathy.   Surgical History:   Past Surgical History:  Procedure Laterality Date   ANTERIOR CERVICAL DECOMPRESSION/DISCECTOMY FUSION 4 LEVELS N/A 10/27/2012   Procedure: Cervical Three-Four Cervical Four-Five Cervical Five-Six Cervical Six-Seven  Anterior cervical decompression/diskectomy/fusion;  Surgeon: Maeola Harman, MD;  Location: [facility] NEURO ORS;  Service: Neurosurgery;  Laterality: N/A;  Cervical Three-Four Cervical Four-Five Cervical Five-Six Cervical Six-Seven  Anterior cervical decompression/diskectomy/fusion   CARDIOVERSION N/A 09/20/2020   Procedure: CARDIOVERSION;  Surgeon: Little Ishikawa, MD;  Location[facility] ENDOSCOPY;  Service:  Cardiovascular;  Laterality: N/A;   FEMUR IM NAIL Right 03/08/2020   Procedure: INTRAMEDULLARY (IM) RETROGRADE FEMORAL NAILING;  Surgeon: Roby Lofts, MD;  Location: [facility] OR;  Service: Orthopedics;  Laterality: Right;   SHOULDER ARTHROSCOPY  2010   rt shoulder   SHOULDER ARTHROSCOPY  02/15/2011   Procedure: ARTHROSCOPY SHOULDER;  Surgeon: Erasmo Leventhal;  Location: [facility];  Service: Orthopedics;  Laterality: Right;  Debridement   TONSILLECTOMY       Social History:   reports that he quit smoking about 33 years ago. His smoking use included cigarettes. He has never used smokeless tobacco. He reports that he does not drink alcohol and does not use drugs.   Family History:  His family history includes Cirrhosis in his father; Emphysema in his mother; Stroke in his brother.   Allergies Allergies  Allergen Reactions   Desmopressin Acetate Other (See Comments)    Hyponatremia    Zoloft [Sertraline] Other (See Comments)    Felt bad - Patient denies     Home Medications  Prior to Admission medications   Medication Sig Start Date End Date Taking? Authorizing Provider  apixaban (ELIQUIS) 5 MG TABS tablet Take 5 mg by mouth 2 (two) times daily. 06/23/20  Yes [provider]  DENTA 5000 PLUS 1.1 % CREA dental cream Place 1 Application onto teeth in the morning and at bedtime. 03/06/23  Yes [provider]  DULoxetine (CYMBALTA) 60 MG capsule Take 60 mg by mouth daily. 05/08/22  Yes [provider]  FARXIGA 10 MG TABS tablet Take 10 mg by mouth daily. 10/10/21  Yes [provider]  ferrous sulfate 325 (65 FE) MG tablet Take 1 tablet (325 mg total) by mouth daily with breakfast. 06/14/22  Yes Azucena Fallen, MD  fluorouracil (EFUDEX) 5 % cream Apply 1 Application topically 2 (two) times daily. For left ear   Yes [provider]  gabapentin (NEURONTIN) 300 MG capsule Take 1 capsule (300 mg total) by mouth 3 (three) times  daily. Patient taking differently: Take 600  mg by mouth 3 (three) times daily. 11/15/22  Yes Narda Bonds, MD  ibuprofen (ADVIL) 200 MG tablet Take 400 mg by mouth as needed for moderate pain.   Yes [provider]  metoprolol tartrate (LOPRESSOR) 25 MG tablet Take 25 mg by mouth 2 (two) times daily. 06/23/20  Yes [provider]  Multiple Vitamin (MULTIVITAMIN WITH MINERALS) TABS tablet Take 1 tablet by mouth daily. 03/18/20  Yes Myrtie Neither, MD  Oxycodone HCl 10 MG TABS Take 10 mg by mouth 2 (two) times daily as needed (severe leg pain). 03/05/23  Yes [provider]  pantoprazole (PROTONIX) 40 MG tablet Take 1 tablet (40 mg total) by mouth daily. 03/18/20  Yes Myrtie Neither, MD  QUEtiapine (SEROQUEL) 300 MG tablet Take 600 mg by mouth at bedtime. 10/16/21  Yes [provider]  silodosin (RAPAFLO) 8 MG CAPS capsule Take 8 mg by mouth daily. 03/18/22  Yes [provider]  Vitamin D, Ergocalciferol, (DRISDOL) 1.25 MG (50000 UNIT) CAPS capsule Take 50,000 Units by mouth once a week. Thursdays 09/09/21  Yes [provider]     Critical care time:

## 2023-04-03 NOTE — TOC Initial Note (Addendum)
Transition of Care [facility]) - Initial/Assessment Note    Patient Details  Name: Dustin [name] MRN: [id number] Date of Birth: [birth year]  Transition of [facility]) CM/SW Contact:    Marliss Coots, LCSW Phone Number: 04/03/2023, 11:45 AM  Clinical Narrative:                  11:45 AM CSW introduced herself and role to patient at bedside. Bedside RN and NT were also present at bedside. Patient consented CSW to speak in front of bedside RN and NT. CSW informed patient of therapy recommendation of discharge to SNF. Patient expressed interest in discharge to SNF within [facility] and consented CSW to submit referrals. CSW offered to call family/associates/friends regarding discharge plan. Patient consented CSW to speak with son and daughter in law. CSW called patient's son, Kathlene November, and daughter in law, [name], and informed them of discharge plan.  Expected Discharge Plan: Skilled Nursing Facility Barriers to Discharge: Continued Medical Work up   Patient Goals and CMS Choice Patient states their goals for this hospitalization and ongoing recovery are:: SNF CMS Medicare.gov Compare Post Acute Care list provided to:: Patient Choice offered to / list presented to : Patient      Expected Discharge Plan and Services In-house Referral: Clinical Social Work   Post Acute Care Choice: Skilled Nursing Facility Living arrangements for the past 2 months: Single Family Home                                      Prior Living Arrangements/Services Living arrangements for the past 2 months: Single Family Home Lives with:: Self Patient language and need for interpreter reviewed:: Yes        Need for Family Participation in Patient Care: Yes (Comment) Care giver support system in place?: Yes (comment)   Criminal Activity/Legal Involvement Pertinent to Current Situation/Hospitalization: No - Comment as needed  Activities of Daily Living   ADL Screening (condition at time of  admission) Independently performs ADLs?: Yes (appropriate for developmental age) Is the patient deaf or have difficulty hearing?: Yes Does the patient have difficulty seeing, even when wearing glasses/contacts?: Yes Does the patient have difficulty concentrating, remembering, or making decisions?: No  Permission Sought/Granted Permission sought to share information with : Family Supports Permission granted to share information with : No (Contact information on chart)  Share Information with NAME: Russ Looper  Permission granted to share info w AGENCY: SNF  Permission granted to share info w Relationship: Daughter in Parks  Permission granted to share info w Contact Information: [phone]  Emotional Assessment Appearance:: Appears stated age Attitude/Demeanor/Rapport: Engaged Affect (typically observed): Accepting, Appropriate, Adaptable, Stable, Calm, Pleasant Orientation: : Oriented to Self, Oriented to Place, Oriented to  Time, Oriented to Situation Alcohol / Substance Use: Not Applicable Psych Involvement: No (comment)  Admission diagnosis:  Coagulopathy (HCC) [D68.9] Femoral fracture (HCC) [S72.90XA] Fall, initial encounter [W19.XXXA] Closed fracture of right hip, initial encounter (HCC) [S72.001A] Patient Active Problem List   Diagnosis Date Noted   Thrombocytopenia (HCC) 03/30/2023   Pain 03/30/2023   Femoral fracture (HCC) 03/30/2023   Acute respiratory failure due to COVID-19 [facility]) 11/13/2022   COPD (chronic obstructive pulmonary disease) (HCC) 11/13/2022   Emphysema of lung (HCC) 06/12/2022   AAA (abdominal aortic aneurysm) (HCC) 06/12/2022   Abdominal pain 06/12/2022   Urinary tract infection without hematuria    Pneumonia due to infectious organism  Acute hypoxemic respiratory failure (HCC) 10/30/2021   Persistent atrial fibrillation (HCC) 08/25/2020   Left ventricular dysfunction 08/25/2020   Acute posthemorrhagic anemia 03/17/2020   Femur fracture (HCC)  03/07/2020   Hip fracture (HCC) 03/07/2020   Cellulitis of right lower extremity    Acute metabolic encephalopathy    Unspecified fracture of right foot, initial encounter for closed fracture    Acute urinary retention    Idiopathic peripheral neuropathy    Sepsis due to cellulitis (HCC) 10/07/2019   New onset headache 01/20/2018   Muscle weakness (generalized) 11/28/2017   Weakness 11/26/2017   Right lumbar radiculopathy 09/18/2016   Right leg pain 07/22/2016   Restless leg syndrome 05/28/2016   Iron deficiency anemia 05/28/2016   Fall 02/10/2016   Rib fracture 02/10/2016   Acute encephalopathy 02/10/2016   Chronic combined systolic (congestive) and diastolic (congestive) heart failure (HCC)    Ataxia    Hyponatremia 01/22/2016   Gait instability 01/22/2016   Urinary retention    Stroke-like symptom    Syncope 05/02/2015   Closed left fibular fracture 05/02/2015   GERD (gastroesophageal reflux disease) 05/02/2015   BPH (benign prostatic hyperplasia) 05/02/2015   Fracture of distal end of left fibula    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 [place] [address] Phone: [phone] Fax: [phone]  [facility] [address] [place] [address] Phone: [phone] Fax: [phone]     Social Drivers of Health (SDOH) Social History: SDOH Screenings   Food Insecurity: No Food Insecurity (03/30/2023)  Housing: Low Risk  (03/30/2023)  Transportation Needs: No Transportation Needs (03/30/2023)  Utilities: Not At Risk (03/30/2023)  Tobacco Use: Medium Risk (03/31/2023)   SDOH Interventions:     Readmission Risk Interventions    06/13/2022   10:53 AM  Readmission Risk Prevention Plan  Transportation Screening Complete  PCP or  Specialist Appt within 5-7 Days Complete  Home Care Screening Complete  Medication Review (RN CM) Complete

## 2023-04-03 NOTE — Progress Notes (Addendum)
PHARMACY - ANTICOAGULATION CONSULT NOTE  Pharmacy Consult for heparin Indication: atrial fibrillation  Allergies  Allergen Reactions   Desmopressin Acetate Other (See Comments)    Hyponatremia    Zoloft [Sertraline] Other (See Comments)    Felt bad - Patient denies    Patient Measurements: Height: 6\' 2"  (188 cm) Weight: 100.3 kg (221 lb 1.9 oz) IBW/kg (Calculated) : 82.2 Heparin Dosing Weight: actual body weight  Vital Signs: Temp: 98.4 F (36.9 C) (02/13 0340) Temp Source: Oral (02/13 0340) BP: 124/51 (02/13 0700) Pulse Rate: 125 (02/13 0700)  Labs: Recent Labs    04/01/23 0539 04/02/23 0602 04/02/23 1103 04/02/23 1552 04/02/23 2114 04/03/23 0309 04/03/23 0606  HGB 8.5*   < > 8.0* 7.9*  --  8.3*  --   HCT 24.6*   < > 24.3* 24.5*  --  25.1*  --   PLT 73*   < > 111* 116*  --  111*  --   HEPARINUNFRC  --   --   --   --  0.93*  --  0.72*  CREATININE 1.41*  --  2.51*  --   --  2.03*  --    < > = values in this interval not displayed.    Estimated Creatinine Clearance: 36.1 mL/min (A) (by C-G formula based on SCr of 2.03 mg/dL (H)).   Medical History: Past Medical History:  Diagnosis Date   BPH (benign prostatic hyperplasia)    Chronic combined systolic and diastolic congestive heart failure (HCC)    GERD (gastroesophageal reflux disease)    protonix for control   Insomnia    Neck pain    Neuropathy     Medications:  Scheduled:   sodium chloride   Intravenous Once   Chlorhexidine Gluconate Cloth  6 each Topical Daily   docusate sodium  100 mg Oral BID   DULoxetine  60 mg Oral Daily   ferrous sulfate  325 mg Oral Q breakfast   metoprolol tartrate  25 mg Oral BID   multivitamin with minerals  1 tablet Oral Daily   mupirocin ointment  1 Application Nasal BID   pantoprazole  40 mg Oral Daily   polyethylene glycol  17 g Oral BID   QUEtiapine  600 mg Oral QHS   senna-docusate  2 tablet Oral BID   sodium chloride flush  3 mL Intravenous Q12H   [START ON  04/06/2023] Vitamin D (Ergocalciferol)  50,000 Units Oral Weekly   Infusions:   sodium chloride Stopped (04/01/23 2220)   ampicillin-sulbactam (UNASYN) IV Stopped (04/03/23 0441)   heparin 1,200 Units/hr (04/03/23 0700)   norepinephrine (LEVOPHED) Adult infusion Stopped (04/02/23 1638)    Assessment: Pt is an 83 yr old male with pmhx of 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 Upon initial ED workup, patient was found to have a right intertrochanteric femoral neck fracture periprosthetic. Patient had a ORIF completed along with removal of hardware of the right femur by MD Haddix with orthopedics on 2/10. Patient progressing well until syncopal event on 2/12, leading to refractory shock now on norepinephrine.   Pharmacy has been consulted to initiate heparin, as orthopedics has cleared patient to restart anticoagulation for AF. Patient is in active atrial fibrillation. LFTs elevated, so holding home apixaban at this time. Will closely monitor H&H given post operative anemia.  AM Heparin level (~6 hr level)  slightly elevated at 0.73, on heparin infusion at 1200 units/hr. No s/sx of bleeding or infusion issues. Drawn from opposite arm.   Goal of Therapy:  Heparin level 0.3-0.7 units/ml Monitor platelets by anticoagulation protocol: Yes   Plan:  Reduce heparin infusion to 1100 units/hr Check daily Xa while on heparin Continue to monitor H&H and platelets  Thank you for allowing pharmacy to participate in this patient's care,  Lora Paula, PharmD PGY-2 Infectious Diseases Pharmacy Resident [facility] 04/03/2023 7:15 AM  Please check AMION for all [facility] Pharmacy phone numbers After 10:00 PM, call Main Pharmacy [phone]

## 2023-04-03 NOTE — [place] FL2 (Signed)
[place] MEDICAID FL2 LEVEL OF CARE FORM     IDENTIFICATION  Patient Name: Dustin [name] Birthdate: Mar 13, 1940 Sex: male Admission Date (Current Location): 03/30/2023  [facility] and [place] Number:  Producer, television/film/video and Address:  [facility] [place], [address], [place], [place] [address]      Provider Number: [id number]  Attending Physician Name and Address:  Martina Sinner, MD  Relative Name and Phone Number:  Trigo Winterbottom; Daughter in law; [phone]    Current Level of Care: Hospital Recommended Level of Care: Skilled Nursing Facility Prior Approval Number:    Date Approved/Denied:   PASRR Number: [id number]  Discharge Plan: SNF    Current Diagnoses: Patient Active Problem List   Diagnosis Date Noted   Thrombocytopenia (HCC) 03/30/2023   Pain 03/30/2023   Femoral fracture (HCC) 03/30/2023   Acute respiratory failure due to COVID-19 [facility]) 11/13/2022   COPD (chronic obstructive pulmonary disease) (HCC) 11/13/2022   Emphysema of lung (HCC) 06/12/2022   AAA (abdominal aortic aneurysm) (HCC) 06/12/2022   Abdominal pain 06/12/2022   Urinary tract infection without hematuria    Pneumonia due to infectious organism    Acute hypoxemic respiratory failure (HCC) 10/30/2021   Persistent atrial fibrillation (HCC) 08/25/2020   Left ventricular dysfunction 08/25/2020   Acute posthemorrhagic anemia 03/17/2020   Femur fracture (HCC) 03/07/2020   Hip fracture (HCC) 03/07/2020   Cellulitis of right lower extremity    Acute metabolic encephalopathy    Unspecified fracture of right foot, initial encounter for closed fracture    Acute urinary retention    Idiopathic peripheral neuropathy    Sepsis due to cellulitis (HCC) 10/07/2019   New onset headache 01/20/2018   Muscle weakness (generalized) 11/28/2017   Weakness 11/26/2017   Right lumbar radiculopathy 09/18/2016   Right leg pain 07/22/2016   Restless leg syndrome 05/28/2016   Iron  deficiency anemia 05/28/2016   Fall 02/10/2016   Rib fracture 02/10/2016   Acute encephalopathy 02/10/2016   Chronic combined systolic (congestive) and diastolic (congestive) heart failure (HCC)    Ataxia    Hyponatremia 01/22/2016   Gait instability 01/22/2016   Urinary retention    Stroke-like symptom    Syncope 05/02/2015   Closed left fibular fracture 05/02/2015   GERD (gastroesophageal reflux disease) 05/02/2015   BPH (benign prostatic hyperplasia) 05/02/2015   Fracture of distal end of left fibula    Memory loss 01/30/2015   Depression 01/30/2015   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    Orientation RESPIRATION BLADDER Height & Weight     Self, Time, Situation, Place  O2 (2L nasal cannula) Indwelling catheter, Continent Weight: 221 lb 1.9 oz (100.3 kg) Height:  6\' 2"  (188 cm)  BEHAVIORAL SYMPTOMS/MOOD NEUROLOGICAL BOWEL NUTRITION STATUS      Continent Diet (Please see dc summary)  AMBULATORY STATUS COMMUNICATION OF NEEDS Skin   Extensive Assist Verbally Other (Comment), Surgical wounds (Incision (Closed) 03/31/23 Hip Right; Incision (Closed) 04/02/23 Knee Anterior;Right)                       Personal Care Assistance Level of Assistance  Bathing, Feeding, Dressing Bathing Assistance: Maximum assistance Feeding assistance: Maximum assistance Dressing Assistance: Maximum assistance     Functional Limitations Info  Sight, Hearing Sight Info: Impaired (R and L (Eyeglasses)) Hearing Info: Impaired (R and L (Hearing Aids))      SPECIAL CARE FACTORS FREQUENCY  PT (By licensed PT), OT (By licensed OT)  PT Frequency: 5x OT Frequency: 5x            Contractures Contractures Info: Present    Additional Factors Info  Code Status, Allergies Code Status Info: DNR- Limited- Do Not Intubate/DNI Allergies Info: Desmopressin Acetate and Zoloft (Sertraline)           Current Medications (04/03/2023):  This is the current hospital active medication  list Current Facility-Administered Medications  Medication Dose Route Frequency Provider Last Rate Last Admin   0.9 %  sodium chloride infusion (Manually program via Guardrails IV Fluids)   Intravenous Once Martina Sinner, MD       0.9 %  sodium chloride infusion   Intravenous Continuous West Bali, PA-C   Stopped at 04/01/23 2220   acetaminophen (TYLENOL) tablet 650 mg  650 mg Oral Q6H PRN West Bali, PA-C   650 mg at 04/02/23 1610   Or   acetaminophen (TYLENOL) suppository 650 mg  650 mg Rectal Q6H PRN West Bali, PA-C       amoxicillin-clavulanate (AUGMENTIN) 875-125 MG per tablet 1 tablet  1 tablet Oral Q12H Tawkaliyar, Roya, DO       Chlorhexidine Gluconate Cloth 2 % PADS 6 each  6 each Topical Daily Dorcas Carrow, MD   6 each at 04/03/23 9604   docusate sodium (COLACE) capsule 100 mg  100 mg Oral BID West Bali, PA-C   100 mg at 04/03/23 5409   DULoxetine (CYMBALTA) DR capsule 60 mg  60 mg Oral Daily Dorcas Carrow, MD   60 mg at 04/03/23 8119   ferrous sulfate tablet 325 mg  325 mg Oral Q breakfast Dorcas Carrow, MD   325 mg at 04/03/23 0800   furosemide (LASIX) injection 20 mg  20 mg Intravenous Once Tawkaliyar, Roya, DO       heparin ADULT infusion 100 units/mL (25000 units/257mL)  1,100 Units/hr Intravenous Continuous Martina Sinner, MD 11 mL/hr at 04/03/23 1000 1,100 Units/hr at 04/03/23 1000   metoCLOPramide (REGLAN) tablet 5-10 mg  5-10 mg Oral Q8H PRN West Bali, PA-C       Or   metoCLOPramide (REGLAN) injection 5-10 mg  5-10 mg Intravenous Q8H PRN West Bali, PA-C       metoprolol tartrate (LOPRESSOR) tablet 25 mg  25 mg Oral BID Rochel Brome C, NP   25 mg at 04/03/23 1478   multivitamin with minerals tablet 1 tablet  1 tablet Oral Daily Dorcas Carrow, MD   1 tablet at 04/03/23 2956   mupirocin ointment (BACTROBAN) 2 % 1 Application  1 Application Nasal BID West Bali, PA-C   1 Application at 04/03/23 2130   naloxone [facility])  injection 0.4 mg  0.4 mg Intravenous PRN Dorcas Carrow, MD   0.4 mg at 04/02/23 1023   ondansetron (ZOFRAN) tablet 4 mg  4 mg Oral Q6H PRN West Bali, PA-C       Or   ondansetron (ZOFRAN) injection 4 mg  4 mg Intravenous Q6H PRN West Bali, PA-C       Oral care mouth rinse  15 mL Mouth Rinse PRN Martina Sinner, MD       oxyCODONE (Oxy IR/ROXICODONE) immediate release tablet 5-10 mg  5-10 mg Oral Q4H PRN Thyra Breed A, PA-C   10 mg at 04/03/23 0805   pantoprazole (PROTONIX) EC tablet 40 mg  40 mg Oral Daily Dorcas Carrow, MD   40 mg at 04/03/23 8657   polyethylene  glycol (MIRALAX / GLYCOLAX) packet 17 g  17 g Oral BID Dorcas Carrow, MD   17 g at 04/03/23 1324   polyvinyl alcohol (LIQUIFILM TEARS) 1.4 % ophthalmic solution 1 drop  1 drop Right Eye Q1H PRN Dorcas Carrow, MD   1 drop at 04/01/23 1518   QUEtiapine (SEROQUEL) tablet 300 mg  300 mg Oral QHS Tawkaliyar, Roya, DO       rOPINIRole (REQUIP) tablet 0.25 mg  0.25 mg Oral TID PRN Dorcas Carrow, MD   0.25 mg at 04/02/23 1036   senna-docusate (Senokot-S) tablet 2 tablet  2 tablet Oral BID West Bali, PA-C   2 tablet at 04/03/23 4010   sodium chloride flush (NS) 0.9 % injection 3 mL  3 mL Intravenous Q12H Thyra Breed A, PA-C   3 mL at 04/03/23 0923   [START ON 04/06/2023] Vitamin D (Ergocalciferol) (DRISDOL) 1.25 MG (50000 UNIT) capsule 50,000 Units  50,000 Units Oral Weekly Dorcas Carrow, MD         Discharge Medications: Please see discharge summary for a list of discharge medications.  Relevant Imaging Results:  Relevant Lab Results:   Additional Information SS # [id number]  Marliss Coots, LCSW

## 2023-04-03 NOTE — Progress Notes (Signed)
eLink Physician-Brief Progress Note Patient Name: Dustin [name] DOB: Aug 11, 1940 MRN: [id number]   Date of Service  04/03/2023  HPI/Events of Note  83 year old  that initially presented to the ICU syncopal episode secondary to hypotension.  Thought to be multifactorial including hypotension but Seroquel, gabapentin and Dilaudid were discontinued at time  eICU Interventions  Patient is insistent that he is unable to sleep without his home Seroquel.  He has no evidence of syncope or presyncope at this time, will resume Seroquel only.     Intervention Category Minor Interventions: Routine modifications to care plan (e.g. PRN medications for pain, fever)  Dustin [name] 04/03/2023, 1:11 AM

## 2023-04-03 NOTE — Progress Notes (Signed)
Patient seen and examined.  He had a hard time sleeping last night.  Otherwise stable.  Came off Levophed drip 9 PM last night.  Still on supplemental oxygen.  Rate controlled A-fib on monitor.  Discussed case with critical care team.  Patient to stay with critical care team today.  TRH team 6 will take over tomorrow.   No charge visit.

## 2023-04-04 DIAGNOSIS — J9601 Acute respiratory failure with hypoxia: Secondary | ICD-10-CM | POA: Diagnosis not present

## 2023-04-04 LAB — CBC
HCT: 26.9 % — ABNORMAL LOW (ref 39.0–52.0)
Hemoglobin: 8.8 g/dL — ABNORMAL LOW (ref 13.0–17.0)
MCH: 30.9 pg (ref 26.0–34.0)
MCHC: 32.7 g/dL (ref 30.0–36.0)
MCV: 94.4 fL (ref 80.0–100.0)
Platelets: 120 10*3/uL — ABNORMAL LOW (ref 150–400)
RBC: 2.85 MIL/uL — ABNORMAL LOW (ref 4.22–5.81)
RDW: 15.9 % — ABNORMAL HIGH (ref 11.5–15.5)
WBC: 8.2 10*3/uL (ref 4.0–10.5)
nRBC: 0 % (ref 0.0–0.2)

## 2023-04-04 LAB — COMPREHENSIVE METABOLIC PANEL
ALT: 117 U/L — ABNORMAL HIGH (ref 0–44)
AST: 273 U/L — ABNORMAL HIGH (ref 15–41)
Albumin: 2.8 g/dL — ABNORMAL LOW (ref 3.5–5.0)
Alkaline Phosphatase: 79 U/L (ref 38–126)
Anion gap: 9 (ref 5–15)
BUN: 24 mg/dL — ABNORMAL HIGH (ref 8–23)
CO2: 23 mmol/L (ref 22–32)
Calcium: 7.8 mg/dL — ABNORMAL LOW (ref 8.9–10.3)
Chloride: 98 mmol/L (ref 98–111)
Creatinine, Ser: 1.42 mg/dL — ABNORMAL HIGH (ref 0.61–1.24)
GFR, Estimated: 50 mL/min — ABNORMAL LOW (ref >60)
Glucose, Bld: 112 mg/dL — ABNORMAL HIGH (ref 70–99)
Potassium: 3.9 mmol/L (ref 3.5–5.1)
Sodium: 130 mmol/L — ABNORMAL LOW (ref 135–145)
Total Bilirubin: 1.4 mg/dL — ABNORMAL HIGH (ref 0.0–1.2)
Total Protein: 5.8 g/dL — ABNORMAL LOW (ref 6.5–8.1)

## 2023-04-04 LAB — BPAM RBC
Blood Product Expiration Date: 202502182359
Blood Product Expiration Date: 202503092359
ISSUE DATE / TIME: 202502131134
ISSUE DATE / TIME: 202502122255
Unit Type and Rh: 9500
Unit Type and Rh: 6200

## 2023-04-04 LAB — TYPE AND SCREEN
ABO/RH(D): A POS
Antibody Screen: NEGATIVE
Unit division: 0
Unit division: 0

## 2023-04-04 LAB — HEPARIN LEVEL (UNFRACTIONATED): Heparin Unfractionated: 0.37 [IU]/mL (ref 0.30–0.70)

## 2023-04-04 MED ORDER — HYDROMORPHONE HCL 1 MG/ML IJ SOLN
0.2500 mg | INTRAMUSCULAR | Status: DC | PRN
  Administered 2023-04-04 (×2): 0.25 mg via INTRAVENOUS
  Filled 2023-04-04 (×2): qty 0.5

## 2023-04-04 MED ORDER — ACETAMINOPHEN 325 MG PO TABS: 650.0000 mg | ORAL_TABLET | Freq: Four times a day (QID) | ORAL | Status: DC | PRN

## 2023-04-04 MED ORDER — APIXABAN 5 MG PO TABS
5.0000 mg | ORAL_TABLET | Freq: Two times a day (BID) | ORAL | Status: DC
  Administered 2023-04-04 – 2023-04-08 (×9): 5 mg via ORAL
  Filled 2023-04-04 (×9): qty 1

## 2023-04-04 MED ORDER — ROPINIROLE HCL 0.5 MG PO TABS
0.5000 mg | ORAL_TABLET | Freq: Three times a day (TID) | ORAL | Status: DC | PRN
  Administered 2023-04-05 – 2023-04-06 (×2): 0.5 mg via ORAL
  Filled 2023-04-04 (×2): qty 1

## 2023-04-04 MED ORDER — TRAZODONE HCL 50 MG PO TABS
100.0000 mg | ORAL_TABLET | Freq: Once | ORAL | Status: AC
  Administered 2023-04-04: 100 mg via ORAL
  Filled 2023-04-04: qty 2

## 2023-04-04 MED ORDER — OXYCODONE HCL 10 MG PO TABS: 5.0000 mg | ORAL_TABLET | ORAL | 0 refills | Status: DC | PRN

## 2023-04-04 MED ORDER — GABAPENTIN 300 MG PO CAPS
300.0000 mg | ORAL_CAPSULE | Freq: Three times a day (TID) | ORAL | Status: DC
  Administered 2023-04-04 – 2023-04-06 (×7): 300 mg via ORAL
  Filled 2023-04-04 (×7): qty 1

## 2023-04-04 MED ORDER — HYDROMORPHONE HCL 1 MG/ML IJ SOLN
0.5000 mg | INTRAMUSCULAR | Status: DC | PRN
  Administered 2023-04-04 – 2023-04-08 (×9): 0.5 mg via INTRAVENOUS
  Filled 2023-04-04 (×9): qty 0.5

## 2023-04-04 MED ORDER — DULOXETINE HCL 60 MG PO CPEP
60.0000 mg | ORAL_CAPSULE | Freq: Every day | ORAL | Status: DC
  Administered 2023-04-04 – 2023-04-08 (×5): 60 mg via ORAL
  Filled 2023-04-04 (×5): qty 1

## 2023-04-04 NOTE — Progress Notes (Signed)
Orthopaedic Trauma Progress Note  SUBJECTIVE: Doing fairly well this morning.  Pain controlled at rest.  Transferred out of the ICU yesterday. Was able able to work with therapies but did have some tachycardia when mobilizing.  Has neuropathy at baseline but denies any significant numbness or tingling throughout the right lower extremity.  No other issues of note.  Tolerating diet and fluids.  OBJECTIVE:  Vitals:   04/04/23 0455 04/04/23 0734  BP: 97/60 (!) 110/56  Pulse: 89 (!) 127  Resp: 19 19  Temp: 98.1 F (36.7 C) 98.1 F (36.7 C)  SpO2: 98% 90%    General: Sitting up in bed, no acute distress Respiratory: No increased work of breathing.  Right lower extremity: Dressings removed, incisions are clean, dry, intact.  Soreness to the thigh as expected.  Notable bruising and swelling over the hip.  Ankle dorsiflexion/plantarflexion is intact.  Patient with neuropathy at baseline but does endorse sensation of light touch over all aspects of the foot.  Compartments soft and compressible.+ DP pulse  IMAGING: Stable post op imaging.   LABS:  Results for orders placed or performed during the hospital encounter of 03/30/23 (from the past 24 hours)  Prepare RBC (crossmatch)     Status: None   Collection Time: 04/03/23 10:17 AM  Result Value Ref Range   Order Confirmation      ORDER PROCESSED BY BLOOD BANK Performed at [facility], [address] [place], [place] [address]   Heparin level (unfractionated)     Status: None   Collection Time: 04/04/23  5:34 AM  Result Value Ref Range   Heparin Unfractionated 0.37 0.30 - 0.70 IU/mL  CBC     Status: Abnormal   Collection Time: 04/04/23  5:34 AM  Result Value Ref Range   WBC 8.2 4.0 - 10.5 K/uL   RBC 2.85 (L) 4.22 - 5.81 MIL/uL   Hemoglobin 8.8 (L) 13.0 - 17.0 g/dL   HCT 21.3 (L) 08.6 - 57.8 %   MCV 94.4 80.0 - 100.0 fL   MCH 30.9 26.0 - 34.0 pg   MCHC 32.7 30.0 - 36.0 g/dL   RDW 46.9 (H) 62.9 - 52.8 %   Platelets 120 (L) 150  - 400 K/uL   nRBC 0.0 0.0 - 0.2 %  Comprehensive metabolic panel     Status: Abnormal   Collection Time: 04/04/23  5:34 AM  Result Value Ref Range   Sodium 130 (L) 135 - 145 mmol/L   Potassium 3.9 3.5 - 5.1 mmol/L   Chloride 98 98 - 111 mmol/L   CO2 23 22 - 32 mmol/L   Glucose, Bld 112 (H) 70 - 99 mg/dL   BUN 24 (H) 8 - 23 mg/dL   Creatinine, Ser 4.13 (H) 0.61 - 1.24 mg/dL   Calcium 7.8 (L) 8.9 - 10.3 mg/dL   Total Protein 5.8 (L) 6.5 - 8.1 g/dL   Albumin 2.8 (L) 3.5 - 5.0 g/dL   AST 244 (H) 15 - 41 U/L   ALT 117 (H) 0 - 44 U/L   Alkaline Phosphatase 79 38 - 126 U/L   Total Bilirubin 1.4 (H) 0.0 - 1.2 mg/dL   GFR, Estimated 50 (L) >60 mL/min   Anion gap 9 5 - 15    ASSESSMENT: Dustin [name] is a 83 y.o. male, 4 Days Post-Op s/p REMOVAL OF HARDWARE RIGHT FEMUR CEPHALOMEDULLARY NAILING RIGHT INTERTROCHANTERIC FEMUR FRACTURE  CV/Blood loss: Acute blood loss anemia, Hgb 8.8 this morning. Hemodynamically stable.  PLAN: Weightbearing: WBAT RLE Incisional and dressing care: Ok to leave incisions open to air Showering: Okay to begin showering and getting incisions wet 04/03/2023 Orthopedic device(s): None  Pain management: Continue current multimodal regimen VTE prophylaxis: Eliquis.  SCDs ID:  Ancef 2gm post op completed Foley/Lines: No Foley.  KVO IVFs Impediments to Fracture Healing: Vitamin D level 65, no additional supplementation needed.   Dispo: PT/OT evaluation ongoing, recommending SNF. Patient ok for d/c from ortho standpoint once cleared by medicine team and therapies.  I have signed and printed d/c rx for pain medication  Discharge recommendations: - Oxycodone for pain control - Continue home dose Eliquis for DVT prophylaxis - No additional vitamin D supplementation needed   Follow - up plan: 2 weeks after discharge for wound check and repeat x-rays  Contact information:  Truitt Merle MD, Ulyses Southward PA-C. After hours and holidays please check [email or web address] for  group call information for [facility]   Dustin Kowal A. Michaelyn Barter, PA-C [phone] (office) [email or web address]

## 2023-04-04 NOTE — Progress Notes (Addendum)
Physical Therapy Treatment Patient Details Name: Dustin [name] MRN: [id number] DOB: [birth year] Today's Date: 04/04/2023   History of Present Illness Pt is an 83 y/o male who presents s/p Fall, sustaining a R intertrochanteric femoral neck periprosthetic fracture. He is now s/p ORIF on 03/31/2023. PMH significant for BPH, CHF, neuropathy, 4 level ACDF 2014, R IM nail 2022.    PT Comments  Pt received in supine, agreeable to therapy session with goal of transfer OOB to [facility] due to bowel urgency. Pt lethargic and with very slow processing and poor attention to task, perseverative on his RLE pain. Pt needing up to +2 maxA for sit<>stand from elevated surfaces to/from [facility] platform lift and up to totalA +2 for bed mobility due to pain/fatigue. HR elevated and BP soft while seated with pt expressing that the floor is moving while in seated posture. Could trial compression socks on BLE next session for better hemodynamic stability. RN notified of pt pain and unable to have BM once on BSC. Pt also did not eat anything from his lunch tray, possibly due to increased confusion/lethargy this date. Pt will continue to benefit from skilled rehab in a post acute setting < 3 hours per day to maximize functional gains before returning home.  BP (!) 101/58  BP Location Left Arm  BP Method Automatic  Patient Position (if appropriate) Sitting (on BSC) "The floor is moving"   BP (!) 116/57 (after return to supine from [facility])  BP Location Left Arm  BP Method Automatic  Patient Position (if appropriate) Lying (HOB 30*)     If plan is discharge home, recommend the following: Two people to help with walking and/or transfers;Two people to help with bathing/dressing/bathroom;Assistance with cooking/housework;Assist for transportation;Help with stairs or ramp for entrance;Assistance with feeding;Direct supervision/assist for medications management;Direct supervision/assist for financial management;Supervision due to  cognitive status   Can travel by private vehicle     No  Equipment Recommendations  None recommended by PT (TBD; currently needing hospital bed and lift OOB when pain is severe)    Recommendations for Other Services       Precautions / Restrictions Precautions Precautions: Fall Recall of Precautions/Restrictions: Impaired Precaution/Restrictions Comments: Pt hesitant to move RLE at all Restrictions Weight Bearing Restrictions Per Provider Order: Yes RLE Weight Bearing Per Provider Order: Weight bearing as tolerated     Mobility  Bed Mobility Overal bed mobility: Needs Assistance Bed Mobility: Supine to Sit, Sit to Supine     Supine to sit: HOB elevated, Used rails, Max assist, +2 for physical assistance Sit to supine: +2 for physical assistance, Total assist   General bed mobility comments: RN did not have time to give additional pain meds prior to session or during session, pt had meds ~3 hours prior per chart review. Pt very limited by pain and despite dense multimodal cues, pt needing +2 maxA to bring RLE and hips to EOB, pt very incrementally tolerating RLE movement ~1 inch at a time, and with very dense cues he was able to assist with offloading his hips by pushing into mattress as MS and PTA use bed pads to advance his hips, very small increments at a time. After using BSC, pt instructed on crossing arms at chest and +2 totalA via helicopter technique to transfer pt back to supine from sitting EOB due to severe pain/fatigue.    Transfers Overall transfer level: Needs assistance Equipment used: Ambulation equipment used Transfers: Sit to/from Stand, Bed to chair/wheelchair/BSC Sit to Stand: From elevated  surface, Max assist, +2 physical assistance, Mod assist, Via lift equipment           General transfer comment: +2 maxA for lateral scoot toward HOB on his R side. From very elevated bed height, +2 modA to STS to [employer] lift, and +2 maxA from lower BSC height,  modA from [place] flaps x2 reps. Pt reporting "the floor is moving" intermittently while seated on BSC ~8 mins between transfer trials. Pt unsuccessful in BM attempt (only thing that came out was suppository, RN notified) Transfer via Lift Equipment: Stedy  Ambulation/Gait               General Gait Details: Unable at this time.   Stairs             Wheelchair Mobility     Tilt Bed    Modified Rankin (Stroke Patients Only)       Balance Overall balance assessment: Needs assistance Sitting-balance support: Feet supported, Bilateral upper extremity supported Sitting balance-Leahy Scale: Fair Sitting balance - Comments: posterior instability unless he uses BUE support, then fair balance   Standing balance support: Bilateral upper extremity supported, During functional activity, Reliant on assistive device for balance Standing balance-Leahy Scale: Poor Standing balance comment: +2 assist and platform lift for static standing                            Communication Communication Communication: Impaired Factors Affecting Communication: Hearing impaired (hearing aids)  Cognition Arousal: Lethargic Behavior During Therapy: Anxious, Flat affect   PT - Cognitive impairments: No family/caregiver present to determine baseline, Initiation, Sequencing, Problem solving, Safety/Judgement, Memory, Attention                       PT - Cognition Comments: Pt poor awareness of plan, constantly asking what to do next although when PTA arrived he was eager to have a BM and agreeable to get to [facility]. Pt very perseverative on his pain, (RN notified via secure chat, call (did not ring through to RN) and Diplomatic Services operational officer notified, but RN did not arrive in 50 mins PTA was in room). Pt may need assist to eat his meals this date due to lethargy, he had not touched his lunch which stated on ticket it arrived ~11am. Following commands: Intact      Cueing Cueing Techniques: Verbal  cues, Gestural cues  Exercises General Exercises - Lower Extremity Heel Slides: AROM, Left, 5 reps, Supine (pt does not allow on RLE due to pain) Hip ABduction/ADduction: Both, Supine, AROM, AAROM, 5 reps (AA on RLE (nearly totalA); ROM on LLE)    General Comments General comments (skin integrity, edema, etc.): SpO2 WFL on RA, HR variable 90's-129 bpm observed by PTA on tele monitor box; see BP in comments above      Pertinent Vitals/Pain Pain Assessment Pain Assessment: Faces Faces Pain Scale: Hurts worst Pain Location: R hip with movement, even minimal amounts Pain Descriptors / Indicators: Operative site guarding, Sore, Grimacing, Moaning, Constant, Sharp Pain Intervention(s): Limited activity within patient's tolerance, Monitored during session, Repositioned, Patient requesting pain meds-RN notified, Ice applied, Other (comment) (RN called)    Home Living                          Prior Function            PT Goals (current goals can now be found in  the care plan section) Acute Rehab PT Goals Patient Stated Goal: Less pain so I can go home PT Goal Formulation: With patient/family Time For Goal Achievement: 04/15/23 Progress towards PT goals: Progressing toward goals (slowly)    Frequency    Min 1X/week      PT Plan      Co-evaluation              AM-PAC PT "6 Clicks" Mobility   Outcome Measure  Help needed turning from your back to your side while in a flat bed without using bedrails?: A Lot Help needed moving from lying on your back to sitting on the side of a flat bed without using bedrails?: Total Help needed moving to and from a bed to a chair (including a wheelchair)?: Total Help needed standing up from a chair using your arms (e.g., wheelchair or bedside chair)?: Total Help needed to walk in hospital room?: Total Help needed climbing 3-5 steps with a railing? : Total 6 Click Score: 7    End of Session Equipment Utilized During Treatment:  Gait belt Activity Tolerance: Patient limited by pain;Patient limited by lethargy Patient left: with call bell/phone within reach;in bed;with bed alarm set;Other (comment) (heels floated) Nurse Communication: Mobility status;Need for lift equipment;Patient requests pain meds;Precautions;Other (comment) (pt suppository fell out while he tried to use BSC; no BM; BP soft on BSC with symptoms of floor moving) PT Visit Diagnosis: Unsteadiness on feet (R26.81);Pain;History of falling (Z91.81);Difficulty in walking, not elsewhere classified (R26.2) Pain - Right/Left: Right Pain - part of body: Hip     Time: 1610-9604 PT Time Calculation (min) (ACUTE ONLY): 49 min  Charges:    $Therapeutic Activity: 38-52 mins PT General Charges $$ ACUTE PT VISIT: 1 Visit                     Masayo Fera P., PTA Acute Rehabilitation Services Secure Chat Preferred 9a-5:30pm Office: [phone]    Dorathy Kinsman [facility] 04/04/2023, 3:38 PM

## 2023-04-04 NOTE — Progress Notes (Signed)
eLink Physician-Brief Progress Note Patient Name: Dustin [name] DOB: 30-Jan-1941 MRN: [id number]   Date of Service  04/04/2023  HPI/Events of Note  Persistent insomnia despite Seroquel earlier  eICU Interventions  Add trazodone 100 mg x1     Intervention Category Minor Interventions: Routine modifications to care plan (e.g. PRN medications for pain, fever)  Dustin [name] 04/04/2023, 12:40 AM

## 2023-04-04 NOTE — TOC Progression Note (Addendum)
Transition of Care [facility]) - Progression Note    Patient Details  Name: Dustin [name] MRN: [id number] Date of Birth: March 31, 1940  [facility]) CM/SW Contact  Lorri Frederick, LCSW Phone Number: 04/04/2023, 1:11 PM  Clinical Narrative:  Bed offers provided to pt on medicare choice document.  Pt said he would review his bed offers with his son and DIL.  CSW spoke with pt DIL [name], who wants to accept offer at [facility]. CSW waiting for confirmation from [place].       Expected Discharge Plan: Skilled Nursing Facility Barriers to Discharge: Continued Medical Work up  Expected Discharge Plan and Services In-house Referral: Clinical Social Work   Post Acute Care Choice: Skilled Nursing Facility Living arrangements for the past 2 months: Single Family Home                                       Social Determinants of Health (SDOH) Interventions SDOH Screenings   Food Insecurity: No Food Insecurity (03/30/2023)  Housing: Low Risk  (03/30/2023)  Transportation Needs: No Transportation Needs (03/30/2023)  Utilities: Not At Risk (03/30/2023)  Tobacco Use: Medium Risk (03/31/2023)    Readmission Risk Interventions    06/13/2022   10:53 AM  Readmission Risk Prevention Plan  Transportation Screening Complete  PCP or Specialist Appt within 5-7 Days Complete  Home Care Screening Complete  Medication Review (RN CM) Complete

## 2023-04-04 NOTE — Progress Notes (Signed)
PROGRESS NOTE    Dustin [name]  WUJ:[id number] DOB: 12-Jan-1941 DOA: 03/30/2023 PCP: Darrow Bussing, MD    Brief Narrative:  83 year old with history of chronic A-fib on Eliquis, history of hip fracture with IM nail presented with mechanical fall and severe pain right thigh area.  In the emergency room he was found to have right intertrochanteric femoral neck fracture periprosthetic.  Admitted with surgical consultation. 2/12, acute events happened.  Patient had rapid A-fib, given 5 mg of metoprolol IV.  Also given his home medications and Dilaudid 0.5 mg.  Had transient unresponsiveness and hypotension.  Transferring to ICU. 2/14, stabilized.  Transiently on Levophed.  Out of ICU.  Difficulty with mobility.  Subjective:  Patient was seen and examined.  He tells me that he has not slept for 3 days.  He was given Seroquel 300 mg last night that is half of his home dose.  Complaints of ongoing pain and asking for dilaudid injection.  Remains A-fib with heart rate about 120 in the telemetry monitor. We discussed about very gradually introducing medications as he is on high doses of potentially sedating medications at home and he has agreeable.  Continue    Assessment & Plan:   Transient hypotension/hypovolemic shock/medication induced drowsiness or bradycardia: Became hypotensive and transiently needed high flow oxygen. Did not respond to IV fluid resuscitation.  He was given peripheral Levophed and transferred to ICU transiently. Cultures negative.  Adequately improved.  Now on room air. Presumptive aspiration pneumonia, will complete 5 days of Augmentin. Start mobilizing.  Closed traumatic intertrochanteric and periprosthetic fracture right femur: Status post ORIF Dr. Jena Gauss 2/10 Weightbearing as tolerated.  On IV heparin.  Resume Eliquis.  Adequate pain medications.  PT OT and postoperative management as per surgery.  He will likely need SNF for rehab.  Persistent A-fib: Mostly  chronic.  On Eliquis.  Rate controlled.  Episode as above.  Metoprolol 25 mg twice daily, Eliquis resumed  Chronic medical issues  Severe peripheral neuropathy, anxiety depression: Continue Cymbalta Resume gabapentin 300 mg 3 times daily, will gradually go up to his home dose of 600 mg 3 times daily. Continue Requip 0.5 mg 3 times daily as needed. Seroquel 300 mg at night.  Anemia of acute blood loss: Anticipated anemia due to long bone fracture.  Baseline hemoglobin about 11.   Received total 2 units of prbc. Stable now. Recheck tomorrow.  AKI on CKD stage IIIb: Baseline historic creatinine 1.4-1.7.  Creatinine increased to 2.51. Likely hemodynamic mediated due to low blood pressure.   Resuscitated and trending down.    DVT prophylaxis: SCDs Start: 03/31/23 1343 apixaban (ELIQUIS) tablet 5 mg   Code Status: DNR with limited intervention.   Family Communication: Daughter-in-law called, unable to pick up phone. Disposition Plan: Status is: Inpatient Remains inpatient appropriate because: Postop recovery.   Consultants:  Orthopedics Critical care  Procedures:  ORIF right hip  Antimicrobials:  Augmentin 2/13---     Objective: Vitals:   04/03/23 2030 04/04/23 0455 04/04/23 0734 04/04/23 0950  BP: 122/62 97/60 (!) 110/56 126/66  Pulse: (!) 112 89 (!) 127 91  Resp: 18 19 19 18   Temp: 98 F (36.7 C) 98.1 F (36.7 C) 98.1 F (36.7 C) 98.2 F (36.8 C)  TempSrc: Oral Oral Oral Oral  SpO2: 97% 98% 90% 93%  Weight:      Height:        Intake/Output Summary (Last 24 hours) at 04/04/2023 1140 Last data filed at 04/04/2023 0454 Gross per  24 hour  Intake 363.14 ml  Output 2260 ml  Net -1896.86 ml   Filed Weights   03/30/23 1017 04/02/23 1304  Weight: 95.3 kg 100.3 kg    Examination:  General: Looks fairly comfortable.  Mildly anxious. Cardiovascular: S1-S2 normal.  Irregularly irregular.  Tachycardic. Respiratory: Bilateral clear.  No added sounds.  On room air  today. Gastrointestinal: Soft and nontender.  Bowel sound present. Ext: Right lateral thigh incision clean and intact.  Edematous and swelling extending from hip to the knee.  No crepitus. Neuro: Alert awake and oriented.  Moves all extremities except the operative extremity.      Data Reviewed: I have personally reviewed following labs and imaging studies  CBC: Recent Labs  Lab 03/30/23 1034 03/30/23 1742 04/02/23 0602 04/02/23 1103 04/02/23 1552 04/03/23 0309 04/04/23 0534  WBC 5.0   < > 11.5* 8.8 11.8* 9.4 8.2  NEUTROABS 3.5  --   --  6.2  --   --   --   HGB 11.9*   < > 9.0* 8.0* 7.9* 8.3* 8.8*  HCT 36.0*   < > 27.3* 24.3* 24.5* 25.1* 26.9*  MCV 97.0   < > 96.5 97.2 97.6 95.1 94.4  PLT 94*   < > 126* 111* 116* 111* 120*   < > = values in this interval not displayed.   Basic Metabolic Panel: Recent Labs  Lab 03/31/23 0529 04/01/23 0539 04/02/23 1103 04/03/23 0309 04/04/23 0534  NA 137 136 133* 137 130*  K 3.7 3.9 4.3 4.2 3.9  CL 97* 99 99 103 98  CO2 23 22 22 26 23   GLUCOSE 101* 131* 156* 130* 112*  BUN 15 22 31* 30* 24*  CREATININE 1.45* 1.41* 2.51* 2.03* 1.42*  CALCIUM 8.9 8.2* 7.7* 7.8* 7.8*  MG  --   --  1.7 2.1  --   PHOS  --   --  2.3* 2.5  --    GFR: Estimated Creatinine Clearance: 51.6 mL/min (A) (by C-G formula based on SCr of 1.42 mg/dL (H)). Liver Function Tests: Recent Labs  Lab 03/30/23 1034 04/02/23 1103 04/03/23 0309 04/04/23 0534  AST 29 458* 444* 273*  ALT 16 140* 104* 117*  ALKPHOS 89 61 64 79  BILITOT 0.9 0.9 1.0 1.4*  PROT 6.5 5.3* 5.4* 5.8*  ALBUMIN 3.5 2.8* 2.8* 2.8*   No results for input(s): "LIPASE", "AMYLASE" in the last 168 hours. No results for input(s): "AMMONIA" in the last 168 hours. Coagulation Profile: Recent Labs  Lab 03/30/23 1034 03/31/23 0529  INR 1.5* 1.3*   Cardiac Enzymes: No results for input(s): "CKTOTAL", "CKMB", "CKMBINDEX", "TROPONINI" in the last 168 hours. BNP (last 3 results) No results for  input(s): "PROBNP" in the last 8760 hours. HbA1C: No results for input(s): "HGBA1C" in the last 72 hours. CBG: Recent Labs  Lab 04/02/23 1133 04/02/23 1252  GLUCAP 145* 145*   Lipid Profile: No results for input(s): "CHOL", "HDL", "LDLCALC", "TRIG", "CHOLHDL", "LDLDIRECT" in the last 72 hours. Thyroid Function Tests: No results for input(s): "TSH", "T4TOTAL", "FREET4", "T3FREE", "THYROIDAB" in the last 72 hours. Anemia Panel: No results for input(s): "VITAMINB12", "FOLATE", "FERRITIN", "TIBC", "IRON", "RETICCTPCT" in the last 72 hours. Sepsis Labs: Recent Labs  Lab 04/02/23 1218 04/02/23 1552  LATICACIDVEN 2.2* 1.8    Recent Results (from the past 240 hours)  Surgical PCR screen     Status: None   Collection Time: 03/31/23  5:00 AM   Specimen: Nasal Mucosa; Nasal Swab  Result Value  Ref Range Status   MRSA, PCR NEGATIVE NEGATIVE Final   Staphylococcus aureus NEGATIVE NEGATIVE Final    Comment: (NOTE) The Xpert SA Assay (FDA approved for NASAL specimens in patients 67 years of age and older), is one component of a comprehensive surveillance program. It is not intended to diagnose infection nor to guide or monitor treatment. Performed at [facility], [address] [place], [place] [address]   Culture, blood (Routine X 2) w Reflex to ID Panel     Status: None (Preliminary result)   Collection Time: 04/02/23 12:18 PM   Specimen: BLOOD RIGHT ARM  Result Value Ref Range Status   Specimen Description BLOOD RIGHT ARM  Final   Special Requests   Final    BOTTLES DRAWN AEROBIC AND ANAEROBIC Blood Culture adequate volume   Culture   Final    NO GROWTH 2 DAYS Performed at [facility], [address] [place], [place] [address]    Report Status PENDING  Incomplete  Culture, blood (Routine X 2) w Reflex to ID Panel     Status: None (Preliminary result)   Collection Time: 04/02/23  3:52 PM   Specimen: BLOOD RIGHT HAND  Result Value Ref Range Status   Specimen  Description BLOOD RIGHT HAND  Final   Special Requests   Final    BOTTLES DRAWN AEROBIC AND ANAEROBIC Blood Culture results may not be optimal due to an inadequate volume of blood received in culture bottles   Culture   Final    NO GROWTH 2 DAYS Performed at [facility], [address], [place], [place] [address]    Report Status PENDING  Incomplete         Radiology Studies: No results found.       Scheduled Meds:  sodium chloride   Intravenous Once   amoxicillin-clavulanate  1 tablet Oral Q12H   apixaban  5 mg Oral BID   Chlorhexidine Gluconate Cloth  6 each Topical Daily   docusate sodium  100 mg Oral BID   DULoxetine  60 mg Oral Daily   ferrous sulfate  325 mg Oral Q breakfast   gabapentin  300 mg Oral TID   metoprolol tartrate  25 mg Oral BID   multivitamin with minerals  1 tablet Oral Daily   mupirocin ointment  1 Application Nasal BID   pantoprazole  40 mg Oral Daily   polyethylene glycol  17 g Oral BID   QUEtiapine  300 mg Oral QHS   senna-docusate  2 tablet Oral BID   sodium chloride flush  3 mL Intravenous Q12H   [START ON 04/06/2023] Vitamin D (Ergocalciferol)  50,000 Units Oral Weekly   Continuous Infusions:     LOS: 5 days    Time spent: 50 minutes    Dorcas Carrow, MD [facility]

## 2023-04-04 NOTE — Progress Notes (Signed)
PHARMACY - ANTICOAGULATION CONSULT NOTE  Pharmacy Consult for heparin Indication: atrial fibrillation  Allergies  Allergen Reactions   Desmopressin Acetate Other (See Comments)    Hyponatremia    Zoloft [Sertraline] Other (See Comments)    Felt bad - Patient denies    Patient Measurements: Height: 6\' 2"  (188 cm) Weight: 100.3 kg (221 lb 1.9 oz) IBW/kg (Calculated) : 82.2 Heparin Dosing Weight: actual body weight  Vital Signs: Temp: 98.1 F (36.7 C) (02/14 0734) Temp Source: Oral (02/14 0734) BP: 110/56 (02/14 0734) Pulse Rate: 127 (02/14 0734)  Labs: Recent Labs    04/02/23 1103 04/02/23 1552 04/02/23 2114 04/03/23 0309 04/03/23 0606 04/04/23 0534  HGB 8.0* 7.9*  --  8.3*  --  8.8*  HCT 24.3* 24.5*  --  25.1*  --  26.9*  PLT 111* 116*  --  111*  --  120*  HEPARINUNFRC  --   --  0.93*  --  0.72* 0.37  CREATININE 2.51*  --   --  2.03*  --  1.42*    Estimated Creatinine Clearance: 51.6 mL/min (A) (by C-G formula based on SCr of 1.42 mg/dL (H)).   Medical History: Past Medical History:  Diagnosis Date   BPH (benign prostatic hyperplasia)    Chronic combined systolic and diastolic congestive heart failure (HCC)    GERD (gastroesophageal reflux disease)    protonix for control   Insomnia    Neck pain    Neuropathy     Medications:  Scheduled:   sodium chloride   Intravenous Once   amoxicillin-clavulanate  1 tablet Oral Q12H   Chlorhexidine Gluconate Cloth  6 each Topical Daily   docusate sodium  100 mg Oral BID   ferrous sulfate  325 mg Oral Q breakfast   metoprolol tartrate  25 mg Oral BID   multivitamin with minerals  1 tablet Oral Daily   mupirocin ointment  1 Application Nasal BID   pantoprazole  40 mg Oral Daily   polyethylene glycol  17 g Oral BID   QUEtiapine  300 mg Oral QHS   senna-docusate  2 tablet Oral BID   sodium chloride flush  3 mL Intravenous Q12H   [START ON 04/06/2023] Vitamin D (Ergocalciferol)  50,000 Units Oral Weekly    Infusions:   sodium chloride Stopped (04/01/23 2220)   heparin 1,100 Units/hr (04/04/23 0454)    Assessment: Pt is an 83 yr old male with pmhx of chronic Afib on eliquis, Chronic combined systolic/diastolic CHF, GERD, and Insomnia who presented to [facility] ED on 2/12 due to a mechanical fall and right thigh/hip pain contributed from fall. Upon initial ED workup, patient was found to have a right intertrochanteric femoral neck fracture periprosthetic. Patient had a ORIF completed along with removal of hardware of the right femur by MD Haddix with orthopedics on 2/10. Patient progressing well until syncopal event on 2/12, leading to refractory shock now on norepinephrine.   HL 0.37 - therapeutic  Plt 120, Hgb 8.8   Goal of Therapy:  Heparin level 0.3-0.7 units/ml Monitor platelets by anticoagulation protocol: Yes   Plan:  Continue heparin infusion at 1100 units/hr Check daily Xa while on heparin Continue to monitor H&H and platelets  Thank you for allowing pharmacy to participate in this patient's care,  Calton Dach, PharmD, BCCCP Clinical Pharmacist 04/04/2023 7:57 AM

## 2023-04-05 DIAGNOSIS — S72001S Fracture of unspecified part of neck of right femur, sequela: Secondary | ICD-10-CM | POA: Diagnosis not present

## 2023-04-05 LAB — CBC
HCT: 27.1 % — ABNORMAL LOW (ref 39.0–52.0)
Hemoglobin: 8.7 g/dL — ABNORMAL LOW (ref 13.0–17.0)
MCH: 30.5 pg (ref 26.0–34.0)
MCHC: 32.1 g/dL (ref 30.0–36.0)
MCV: 95.1 fL (ref 80.0–100.0)
Platelets: 129 10*3/uL — ABNORMAL LOW (ref 150–400)
RBC: 2.85 MIL/uL — ABNORMAL LOW (ref 4.22–5.81)
RDW: 15.8 % — ABNORMAL HIGH (ref 11.5–15.5)
WBC: 6.4 10*3/uL (ref 4.0–10.5)
nRBC: 0 % (ref 0.0–0.2)

## 2023-04-05 LAB — COMPREHENSIVE METABOLIC PANEL
ALT: 125 U/L — ABNORMAL HIGH (ref 0–44)
AST: 212 U/L — ABNORMAL HIGH (ref 15–41)
Albumin: 2.8 g/dL — ABNORMAL LOW (ref 3.5–5.0)
Alkaline Phosphatase: 83 U/L (ref 38–126)
Anion gap: 8 (ref 5–15)
BUN: 20 mg/dL (ref 8–23)
CO2: 25 mmol/L (ref 22–32)
Calcium: 8.1 mg/dL — ABNORMAL LOW (ref 8.9–10.3)
Chloride: 100 mmol/L (ref 98–111)
Creatinine, Ser: 1.53 mg/dL — ABNORMAL HIGH (ref 0.61–1.24)
GFR, Estimated: 45 mL/min — ABNORMAL LOW (ref >60)
Glucose, Bld: 106 mg/dL — ABNORMAL HIGH (ref 70–99)
Potassium: 3.7 mmol/L (ref 3.5–5.1)
Sodium: 133 mmol/L — ABNORMAL LOW (ref 135–145)
Total Bilirubin: 1.5 mg/dL — ABNORMAL HIGH (ref 0.0–1.2)
Total Protein: 5.9 g/dL — ABNORMAL LOW (ref 6.5–8.1)

## 2023-04-05 NOTE — Progress Notes (Signed)
[facility] A. Cavaz NP notified that cardiac monitoring will expire in 4 hrs and that patient did episode of HR elevated to 140's non sustained and patient was asymptomatic and HR decreased to low 100

## 2023-04-05 NOTE — Progress Notes (Signed)
PROGRESS NOTE    Dustin [name]  ZOX:[id number] DOB: 1940/12/15 DOA: 03/30/2023 PCP: Darrow Bussing, MD    Brief Narrative:  83 year old with history of chronic A-fib on Eliquis, history of hip fracture with IM nail presented with mechanical fall and severe pain right thigh area.  In the emergency room he was found to have right intertrochanteric femoral neck fracture periprosthetic.  Admitted with surgical consultation. 2/12, acute events happened.  Patient had rapid A-fib, given 5 mg of metoprolol IV.  Also given his home medications and Dilaudid 0.5 mg.  Had transient unresponsiveness and hypotension.  Transferring to ICU. 2/14, stabilized.  Transiently on Levophed.  Out of ICU.  Difficulty with mobility.  Subjective:  Patient seen and examined.  More comfortable today with increased dose of Dilaudid.  Leg is huge and edematous. Remains in A-fib but mostly rate controlled 120-occasional day 130.  Denies any chest pain or palpitations.  No family at the bedside.      Assessment & Plan:   Transient hypotension/hypovolemic shock/medication induced drowsiness or bradycardia: Became hypotensive and transiently needed high flow oxygen. Did not respond to IV fluid resuscitation.  He was given peripheral Levophed and transferred to ICU transiently. Cultures negative.  Adequately improved.  Now on room air. Presumptive aspiration pneumonia, will complete 5 days of Augmentin. Start mobilizing.  Closed traumatic intertrochanteric and periprosthetic fracture right femur: Status post ORIF Dr. Jena Gauss 2/10 Weightbearing as tolerated.  On IV heparin.  Resume Eliquis.  Adequate pain medications.  PT OT and postoperative management as per surgery.  Refer to surgery.  Persistent A-fib: Mostly chronic.  On Eliquis.  Rate controlled.  Episode as above.  Metoprolol 25 mg twice daily, Eliquis resumed  Chronic medical issues  Severe peripheral neuropathy, anxiety depression: Continue  Cymbalta Resume gabapentin 300 mg 3 times daily, will gradually go up to his home dose of 600 mg 3 times daily. Continue Requip 0.5 mg 3 times daily as needed. Seroquel 300 mg at night -half of the home dose.  Anemia of acute blood loss: Anticipated anemia due to long bone fracture.  Baseline hemoglobin about 11.   Received total 2 units of prbc. Stable now.   AKI on CKD stage IIIb: Baseline historic creatinine 1.4-1.7.  Creatinine increased to 2.51. Likely hemodynamic mediated due to low blood pressure.   Resuscitated and trending down.   DVT prophylaxis: SCDs Start: 03/31/23 1343 apixaban (ELIQUIS) tablet 5 mg   Code Status: DNR with limited intervention.   Family Communication: Daughter-in-law called, unable to pick up phone. Disposition Plan: Status is: Inpatient Remains inpatient appropriate because: Postop recovery.   Consultants:  Orthopedics Critical care  Procedures:  ORIF right hip  Antimicrobials:  Augmentin 2/13---     Objective: Vitals:   04/04/23 1510 04/04/23 2133 04/05/23 0552 04/05/23 0731  BP: (!) 116/57 128/71 118/74 119/63  Pulse: (!) 102 97 100 93  Resp:  18 17 17   Temp:  98.1 F (36.7 C) (!) 97.5 F (36.4 C) 98.3 F (36.8 C)  TempSrc:  Oral Oral Oral  SpO2: 96% 94% 94% 90%  Weight:      Height:        Intake/Output Summary (Last 24 hours) at 04/05/2023 1130 Last data filed at 04/05/2023 8119 Gross per 24 hour  Intake 480 ml  Output 2551 ml  Net -2071 ml   Filed Weights   03/30/23 1017 04/02/23 1304  Weight: 95.3 kg 100.3 kg    Examination:  General: Fairly comfortable today.  On room air. Cardiovascular: S1-S2 normal.  Irregularly irregular.  Tachycardic. Respiratory: Bilateral clear.  No added sounds.  On room air today. Gastrointestinal: Soft and nontender.  Bowel sound present. Ext: Right lateral thigh incision clean and intact.   Ecchymosis lateral thigh.  Right thigh is twice the size of left.  Distal neurovascular status  intact.      Data Reviewed: I have personally reviewed following labs and imaging studies  CBC: Recent Labs  Lab 03/30/23 1034 03/30/23 1742 04/02/23 1103 04/02/23 1552 04/03/23 0309 04/04/23 0534 04/05/23 0521  WBC 5.0   < > 8.8 11.8* 9.4 8.2 6.4  NEUTROABS 3.5  --  6.2  --   --   --   --   HGB 11.9*   < > 8.0* 7.9* 8.3* 8.8* 8.7*  HCT 36.0*   < > 24.3* 24.5* 25.1* 26.9* 27.1*  MCV 97.0   < > 97.2 97.6 95.1 94.4 95.1  PLT 94*   < > 111* 116* 111* 120* 129*   < > = values in this interval not displayed.   Basic Metabolic Panel: Recent Labs  Lab 04/01/23 0539 04/02/23 1103 04/03/23 0309 04/04/23 0534 04/05/23 0521  NA 136 133* 137 130* 133*  K 3.9 4.3 4.2 3.9 3.7  CL 99 99 103 98 100  CO2 22 22 26 23 25   GLUCOSE 131* 156* 130* 112* 106*  BUN 22 31* 30* 24* 20  CREATININE 1.41* 2.51* 2.03* 1.42* 1.53*  CALCIUM 8.2* 7.7* 7.8* 7.8* 8.1*  MG  --  1.7 2.1  --   --   PHOS  --  2.3* 2.5  --   --    GFR: Estimated Creatinine Clearance: 47.9 mL/min (A) (by C-G formula based on SCr of 1.53 mg/dL (H)). Liver Function Tests: Recent Labs  Lab 03/30/23 1034 04/02/23 1103 04/03/23 0309 04/04/23 0534 04/05/23 0521  AST 29 458* 444* 273* 212*  ALT 16 140* 104* 117* 125*  ALKPHOS 89 61 64 79 83  BILITOT 0.9 0.9 1.0 1.4* 1.5*  PROT 6.5 5.3* 5.4* 5.8* 5.9*  ALBUMIN 3.5 2.8* 2.8* 2.8* 2.8*   No results for input(s): "LIPASE", "AMYLASE" in the last 168 hours. No results for input(s): "AMMONIA" in the last 168 hours. Coagulation Profile: Recent Labs  Lab 03/30/23 1034 03/31/23 0529  INR 1.5* 1.3*   Cardiac Enzymes: No results for input(s): "CKTOTAL", "CKMB", "CKMBINDEX", "TROPONINI" in the last 168 hours. BNP (last 3 results) No results for input(s): "PROBNP" in the last 8760 hours. HbA1C: No results for input(s): "HGBA1C" in the last 72 hours. CBG: Recent Labs  Lab 04/02/23 1133 04/02/23 1252  GLUCAP 145* 145*   Lipid Profile: No results for input(s):  "CHOL", "HDL", "LDLCALC", "TRIG", "CHOLHDL", "LDLDIRECT" in the last 72 hours. Thyroid Function Tests: No results for input(s): "TSH", "T4TOTAL", "FREET4", "T3FREE", "THYROIDAB" in the last 72 hours. Anemia Panel: No results for input(s): "VITAMINB12", "FOLATE", "FERRITIN", "TIBC", "IRON", "RETICCTPCT" in the last 72 hours. Sepsis Labs: Recent Labs  Lab 04/02/23 1218 04/02/23 1552  LATICACIDVEN 2.2* 1.8    Recent Results (from the past 240 hours)  Surgical PCR screen     Status: None   Collection Time: 03/31/23  5:00 AM   Specimen: Nasal Mucosa; Nasal Swab  Result Value Ref Range Status   MRSA, PCR NEGATIVE NEGATIVE Final   Staphylococcus aureus NEGATIVE NEGATIVE Final    Comment: (NOTE) The Xpert SA Assay (FDA approved for NASAL specimens in patients 49 years of age and  older), is one component of a comprehensive surveillance program. It is not intended to diagnose infection nor to guide or monitor treatment. Performed at [facility], [address] [place], [place] [address]   Culture, blood (Routine X 2) w Reflex to ID Panel     Status: None (Preliminary result)   Collection Time: 04/02/23 12:18 PM   Specimen: BLOOD RIGHT ARM  Result Value Ref Range Status   Specimen Description BLOOD RIGHT ARM  Final   Special Requests   Final    BOTTLES DRAWN AEROBIC AND ANAEROBIC Blood Culture adequate volume   Culture   Final    NO GROWTH 3 DAYS Performed at [facility], [address] [place], [place] [address]    Report Status PENDING  Incomplete  Culture, blood (Routine X 2) w Reflex to ID Panel     Status: None (Preliminary result)   Collection Time: 04/02/23  3:52 PM   Specimen: BLOOD RIGHT HAND  Result Value Ref Range Status   Specimen Description BLOOD RIGHT HAND  Final   Special Requests   Final    BOTTLES DRAWN AEROBIC AND ANAEROBIC Blood Culture results may not be optimal due to an inadequate volume of blood received in culture bottles   Culture   Final     NO GROWTH 3 DAYS Performed at [facility], [address] [place], [place] [address]    Report Status PENDING  Incomplete         Radiology Studies: No results found.       Scheduled Meds:  sodium chloride   Intravenous Once   amoxicillin-clavulanate  1 tablet Oral Q12H   apixaban  5 mg Oral BID   Chlorhexidine Gluconate Cloth  6 each Topical Daily   docusate sodium  100 mg Oral BID   DULoxetine  60 mg Oral Daily   ferrous sulfate  325 mg Oral Q breakfast   gabapentin  300 mg Oral TID   metoprolol tartrate  25 mg Oral BID   multivitamin with minerals  1 tablet Oral Daily   pantoprazole  40 mg Oral Daily   polyethylene glycol  17 g Oral BID   QUEtiapine  300 mg Oral QHS   senna-docusate  2 tablet Oral BID   sodium chloride flush  3 mL Intravenous Q12H   [START ON 04/06/2023] Vitamin D (Ergocalciferol)  50,000 Units Oral Weekly   Continuous Infusions:     LOS: 6 days    Time spent: 40 minutes    Dorcas Carrow, MD [facility]

## 2023-04-05 NOTE — Plan of Care (Signed)

## 2023-04-05 NOTE — Progress Notes (Signed)
Cardiac monitoring called stated that patient HR 140's non sustained and dropped down to low 100's. Patient was asymptomatic no short of breath or chest palpitations or pain he did state he was having some pain and was medicated.

## 2023-04-05 NOTE — Progress Notes (Signed)
Patient's heart rate sustaining in the 130s. RN consulted with Dr. Jerral Ralph at [phone] who stated that no additional intervention is needed due to the patient being asymptomatic.

## 2023-04-05 NOTE — Plan of Care (Signed)

## 2023-04-05 NOTE — Plan of Care (Signed)
   Problem: Education: Goal: Knowledge of General Education information will improve Description: Including pain rating scale, medication(s)/side effects and non-pharmacologic comfort measures Outcome: Progressing   Problem: Activity: Goal: Risk for activity intolerance will decrease Outcome: Progressing   Problem: Nutrition: Goal: Adequate nutrition will be maintained Outcome: Progressing

## 2023-04-06 DIAGNOSIS — S72001S Fracture of unspecified part of neck of right femur, sequela: Secondary | ICD-10-CM | POA: Diagnosis not present

## 2023-04-06 LAB — COMPREHENSIVE METABOLIC PANEL
ALT: 99 U/L — ABNORMAL HIGH (ref 0–44)
AST: 117 U/L — ABNORMAL HIGH (ref 15–41)
Albumin: 2.9 g/dL — ABNORMAL LOW (ref 3.5–5.0)
Alkaline Phosphatase: 99 U/L (ref 38–126)
Anion gap: 10 (ref 5–15)
BUN: 19 mg/dL (ref 8–23)
CO2: 23 mmol/L (ref 22–32)
Calcium: 8.3 mg/dL — ABNORMAL LOW (ref 8.9–10.3)
Chloride: 100 mmol/L (ref 98–111)
Creatinine, Ser: 1.39 mg/dL — ABNORMAL HIGH (ref 0.61–1.24)
GFR, Estimated: 51 mL/min — ABNORMAL LOW (ref >60)
Glucose, Bld: 116 mg/dL — ABNORMAL HIGH (ref 70–99)
Potassium: 3.9 mmol/L (ref 3.5–5.1)
Sodium: 133 mmol/L — ABNORMAL LOW (ref 135–145)
Total Bilirubin: 2 mg/dL — ABNORMAL HIGH (ref 0.0–1.2)
Total Protein: 6 g/dL — ABNORMAL LOW (ref 6.5–8.1)

## 2023-04-06 MED ORDER — METOPROLOL TARTRATE 25 MG PO TABS
37.5000 mg | ORAL_TABLET | Freq: Two times a day (BID) | ORAL | Status: DC
  Administered 2023-04-06 – 2023-04-08 (×4): 37.5 mg via ORAL
  Filled 2023-04-06 (×4): qty 1

## 2023-04-06 MED ORDER — GABAPENTIN 300 MG PO CAPS
600.0000 mg | ORAL_CAPSULE | Freq: Three times a day (TID) | ORAL | Status: DC
  Administered 2023-04-06 – 2023-04-08 (×6): 600 mg via ORAL
  Filled 2023-04-06 (×3): qty 2
  Filled 2023-04-06: qty 6
  Filled 2023-04-06 (×2): qty 2

## 2023-04-06 NOTE — TOC Progression Note (Addendum)
Transition of Care [facility]) - Progression Note    Patient Details  Name: Dustin [name] MRN: [id number] Date of Birth: 04-03-1940  [facility] CM/SW Contact  Lyfe Reihl [place], [place] Phone Number: 04/06/2023, 2:14 PM  Clinical Narrative:     Message left with [facility] to confirm bed offer.  Will start insurance authorization once confirmed.  Topaz Raglin, LCSW Transition of Care    Expected Discharge Plan: Skilled Nursing Facility Barriers to Discharge: Continued Medical Work up  Expected Discharge Plan and Services In-house Referral: Clinical Social Work   Post Acute Care Choice: Skilled Nursing Facility Living arrangements for the past 2 months: Single Family Home                                       Social Determinants of Health (SDOH) Interventions SDOH Screenings   Food Insecurity: No Food Insecurity (03/30/2023)  Housing: Low Risk  (03/30/2023)  Transportation Needs: No Transportation Needs (03/30/2023)  Utilities: Not At Risk (03/30/2023)  Tobacco Use: Medium Risk (03/31/2023)    Readmission Risk Interventions    06/13/2022   10:53 AM  Readmission Risk Prevention Plan  Transportation Screening Complete  PCP or Specialist Appt within 5-7 Days Complete  Home Care Screening Complete  Medication Review (RN CM) Complete

## 2023-04-06 NOTE — Plan of Care (Signed)
   Problem: Nutrition: Goal: Adequate nutrition will be maintained Outcome: Progressing   Problem: Coping: Goal: Level of anxiety will decrease Outcome: Progressing

## 2023-04-06 NOTE — Plan of Care (Signed)

## 2023-04-06 NOTE — TOC Progression Note (Addendum)
Transition of Care [facility]) - Progression Note    Patient Details  Name: Dustin [name] MRN: [id number] Date of Birth: 08-25-1940  Transition of Care [facility]) CM/SW Contact  Demri Poulton, [place], [place] Phone Number: 04/06/2023, 3:35 PM  Clinical Narrative:    [facility] confirmed that authorization will need to be started for admission.  [employer] authorization started-reference number [id number]  Ruhan Borak, LCSW Transition of Care       Expected Discharge Plan: Skilled Nursing Facility Barriers to Discharge: Continued Medical Work up  Expected Discharge Plan and Services In-house Referral: Clinical Social Work   Post Acute Care Choice: Skilled Nursing Facility Living arrangements for the past 2 months: Single Family Home                                       Social Determinants of Health (SDOH) Interventions SDOH Screenings   Food Insecurity: No Food Insecurity (03/30/2023)  Housing: Low Risk  (03/30/2023)  Transportation Needs: No Transportation Needs (03/30/2023)  Utilities: Not At Risk (03/30/2023)  Tobacco Use: Medium Risk (03/31/2023)    Readmission Risk Interventions    06/13/2022   10:53 AM  Readmission Risk Prevention Plan  Transportation Screening Complete  PCP or Specialist Appt within 5-7 Days Complete  Home Care Screening Complete  Medication Review (RN CM) Complete

## 2023-04-06 NOTE — Progress Notes (Signed)
PROGRESS NOTE    Dustin [name]  UEA:[id number] DOB: 1940/03/04 DOA: 03/30/2023 PCP: Darrow Bussing, MD    Brief Narrative:  83 year old with history of chronic A-fib on Eliquis, history of hip fracture with IM nail presented with mechanical fall and severe pain right thigh area.  In the emergency room he was found to have right intertrochanteric femoral neck fracture periprosthetic.  Admitted with surgical consultation. 2/12  Patient had rapid A-fib, given 5 mg of metoprolol IV.  Also given his home medications and Dilaudid 0.5 mg.  Had transient unresponsiveness and hypotension.  Transferring to ICU. 2/14, stabilized.  Transiently on Levophed.  Out of ICU.  Difficulty with mobility.  Subjective:  Patient seen and examined.  More comfortable today.  Denies any chest pain shortness of breath.  Mostly on room air.  Pain is still significantly present and he has.  Difficulty moving that right leg.  Trying to use oxycodone but sometimes asking for Dilaudid because of much pain. Remains in A-fib with occasional heart rate more than 130.  Without palpitations.  Anticipating to work with therapies today.    Assessment & Plan:   Transient hypotension/hypovolemic shock/medication induced drowsiness or bradycardia: Became hypotensive and transiently needed high flow oxygen. Did not respond to IV fluid resuscitation.  He was given peripheral Levophed and transferred to ICU for 1 day. Cultures negative.  Adequately improved.  Now on room air. Presumptive aspiration pneumonia, will complete 5 days of Augmentin. Start mobilizing.  Closed traumatic intertrochanteric and periprosthetic fracture right femur: Status post ORIF Dr. Jena Gauss 2/10 Weightbearing as tolerated.  DVT prophylaxis with Eliquis now. Adequate pain medications.  PT OT and postoperative management as per surgery.   Persistent A-fib: Mostly chronic.  On Eliquis.  Rate not optimally controlled today. Tolerating metoprolol 25 mg twice  daily that he uses at home.  Will increase dose to 37.5 mg twice daily. Eliquis resumed  Chronic medical issues  Severe peripheral neuropathy, anxiety depression: Continue Cymbalta Currently tolerating gabapentin 300 mg 3 times daily, will increase his dose to home dose of 600 mg 3 times daily. Continue Requip 0.5 mg 3 times daily as needed. Seroquel 300 mg at night -half of the home dose.  Do not increase.  Anemia of acute blood loss: Anticipated anemia due to long bone fracture.  Baseline hemoglobin about 11.   Received total 2 units of prbc. Stable now.   AKI on CKD stage IIIb: Baseline historic creatinine 1.4-1.7.  Creatinine increased to 2.51. Likely hemodynamic mediated due to low blood pressure.   Resuscitated and trending down.   DVT prophylaxis: SCDs Start: 03/31/23 1343 apixaban (ELIQUIS) tablet 5 mg   Code Status: DNR with limited intervention.   Family Communication: None today. Disposition Plan: Status is: Inpatient Remains inpatient appropriate because: Postop recovery.  Needs a SNF.   Consultants:  Orthopedics Critical care  Procedures:  ORIF right hip  Antimicrobials:  Augmentin 2/13---     Objective: Vitals:   04/05/23 2019 04/06/23 0457 04/06/23 0500 04/06/23 0759  BP: 108/61 (!) 119/58  138/73  Pulse: (!) 104 (!) 103  100  Resp: 18 19  16   Temp: 97.9 F (36.6 C) (!) 97.5 F (36.4 C) 97.9 F (36.6 C) 98.6 F (37 C)  TempSrc: Oral Oral Oral Oral  SpO2: 93% 95%  96%  Weight:      Height:        Intake/Output Summary (Last 24 hours) at 04/06/2023 1130 Last data filed at 04/06/2023 1000 Gross per  24 hour  Intake --  Output 1450 ml  Net -1450 ml   Filed Weights   03/30/23 1017 04/02/23 1304  Weight: 95.3 kg 100.3 kg    Examination:  General: Alert awake and oriented.  Interactive today. Cardiovascular: S1-S2 normal.  Irregularly irregular.  Tachycardic. Respiratory: Bilateral clear.  No added sounds.  On room air  today. Gastrointestinal: Soft and nontender.  Bowel sound present. Ext: Right lateral thigh incision clean and intact.   Ecchymosis lateral thigh.  Right thigh is twice the size of left.  Distal neurovascular status intact.      Data Reviewed: I have personally reviewed following labs and imaging studies  CBC: Recent Labs  Lab 04/02/23 1103 04/02/23 1552 04/03/23 0309 04/04/23 0534 04/05/23 0521  WBC 8.8 11.8* 9.4 8.2 6.4  NEUTROABS 6.2  --   --   --   --   HGB 8.0* 7.9* 8.3* 8.8* 8.7*  HCT 24.3* 24.5* 25.1* 26.9* 27.1*  MCV 97.2 97.6 95.1 94.4 95.1  PLT 111* 116* 111* 120* 129*   Basic Metabolic Panel: Recent Labs  Lab 04/02/23 1103 04/03/23 0309 04/04/23 0534 04/05/23 0521 04/06/23 0412  NA 133* 137 130* 133* 133*  K 4.3 4.2 3.9 3.7 3.9  CL 99 103 98 100 100  CO2 22 26 23 25 23   GLUCOSE 156* 130* 112* 106* 116*  BUN 31* 30* 24* 20 19  CREATININE 2.51* 2.03* 1.42* 1.53* 1.39*  CALCIUM 7.7* 7.8* 7.8* 8.1* 8.3*  MG 1.7 2.1  --   --   --   PHOS 2.3* 2.5  --   --   --    GFR: Estimated Creatinine Clearance: 52.7 mL/min (A) (by C-G formula based on SCr of 1.39 mg/dL (H)). Liver Function Tests: Recent Labs  Lab 04/02/23 1103 04/03/23 0309 04/04/23 0534 04/05/23 0521 04/06/23 0412  AST 458* 444* 273* 212* 117*  ALT 140* 104* 117* 125* 99*  ALKPHOS 61 64 79 83 99  BILITOT 0.9 1.0 1.4* 1.5* 2.0*  PROT 5.3* 5.4* 5.8* 5.9* 6.0*  ALBUMIN 2.8* 2.8* 2.8* 2.8* 2.9*   No results for input(s): "LIPASE", "AMYLASE" in the last 168 hours. No results for input(s): "AMMONIA" in the last 168 hours. Coagulation Profile: Recent Labs  Lab 03/31/23 0529  INR 1.3*   Cardiac Enzymes: No results for input(s): "CKTOTAL", "CKMB", "CKMBINDEX", "TROPONINI" in the last 168 hours. BNP (last 3 results) No results for input(s): "PROBNP" in the last 8760 hours. HbA1C: No results for input(s): "HGBA1C" in the last 72 hours. CBG: Recent Labs  Lab 04/02/23 1133 04/02/23 1252   GLUCAP 145* 145*   Lipid Profile: No results for input(s): "CHOL", "HDL", "LDLCALC", "TRIG", "CHOLHDL", "LDLDIRECT" in the last 72 hours. Thyroid Function Tests: No results for input(s): "TSH", "T4TOTAL", "FREET4", "T3FREE", "THYROIDAB" in the last 72 hours. Anemia Panel: No results for input(s): "VITAMINB12", "FOLATE", "FERRITIN", "TIBC", "IRON", "RETICCTPCT" in the last 72 hours. Sepsis Labs: Recent Labs  Lab 04/02/23 1218 04/02/23 1552  LATICACIDVEN 2.2* 1.8    Recent Results (from the past 240 hours)  Surgical PCR screen     Status: None   Collection Time: 03/31/23  5:00 AM   Specimen: Nasal Mucosa; Nasal Swab  Result Value Ref Range Status   MRSA, PCR NEGATIVE NEGATIVE Final   Staphylococcus aureus NEGATIVE NEGATIVE Final    Comment: (NOTE) The Xpert SA Assay (FDA approved for NASAL specimens in patients 62 years of age and older), is one component of a comprehensive surveillance  program. It is not intended to diagnose infection nor to guide or monitor treatment. Performed at [facility], [address] [place], [place] [address]   Culture, blood (Routine X 2) w Reflex to ID Panel     Status: None (Preliminary result)   Collection Time: 04/02/23 12:18 PM   Specimen: BLOOD RIGHT ARM  Result Value Ref Range Status   Specimen Description BLOOD RIGHT ARM  Final   Special Requests   Final    BOTTLES DRAWN AEROBIC AND ANAEROBIC Blood Culture adequate volume   Culture   Final    NO GROWTH 4 DAYS Performed at [facility], [address] [place], [place] [address]    Report Status PENDING  Incomplete  Culture, blood (Routine X 2) w Reflex to ID Panel     Status: None (Preliminary result)   Collection Time: 04/02/23  3:52 PM   Specimen: BLOOD RIGHT HAND  Result Value Ref Range Status   Specimen Description BLOOD RIGHT HAND  Final   Special Requests   Final    BOTTLES DRAWN AEROBIC AND ANAEROBIC Blood Culture results may not be optimal due to an inadequate  volume of blood received in culture bottles   Culture   Final    NO GROWTH 4 DAYS Performed at [facility], [address], [place], [place] [address]    Report Status PENDING  Incomplete         Radiology Studies: No results found.       Scheduled Meds:  sodium chloride   Intravenous Once   amoxicillin-clavulanate  1 tablet Oral Q12H   apixaban  5 mg Oral BID   Chlorhexidine Gluconate Cloth  6 each Topical Daily   docusate sodium  100 mg Oral BID   DULoxetine  60 mg Oral Daily   ferrous sulfate  325 mg Oral Q breakfast   gabapentin  600 mg Oral TID   metoprolol tartrate  37.5 mg Oral BID   multivitamin with minerals  1 tablet Oral Daily   pantoprazole  40 mg Oral Daily   polyethylene glycol  17 g Oral BID   QUEtiapine  300 mg Oral QHS   senna-docusate  2 tablet Oral BID   sodium chloride flush  3 mL Intravenous Q12H   Vitamin D (Ergocalciferol)  50,000 Units Oral Weekly   Continuous Infusions:     LOS: 7 days    Time spent: 40 minutes    Dorcas Carrow, MD [facility]

## 2023-04-07 DIAGNOSIS — S72001S Fracture of unspecified part of neck of right femur, sequela: Secondary | ICD-10-CM | POA: Diagnosis not present

## 2023-04-07 LAB — CULTURE, BLOOD (ROUTINE X 2)
Culture: NO GROWTH
Culture: NO GROWTH
Special Requests: ADEQUATE

## 2023-04-07 MED ORDER — BENZONATATE 100 MG PO CAPS
100.0000 mg | ORAL_CAPSULE | Freq: Three times a day (TID) | ORAL | Status: DC | PRN
  Administered 2023-04-07: 100 mg via ORAL
  Filled 2023-04-07: qty 1

## 2023-04-07 NOTE — Plan of Care (Signed)

## 2023-04-07 NOTE — Progress Notes (Signed)
PROGRESS NOTE    Dustin ANDRES  [name]:[id number] DOB: 1940-07-10 DOA: 03/30/2023 PCP: Darrow Bussing, MD    Brief Narrative:  83 year old with history of chronic A-fib on Eliquis, history of hip fracture with IM nail presented with mechanical fall and severe pain right thigh area.  In the emergency room he was found to have right intertrochanteric femoral neck fracture periprosthetic.  Admitted with surgical consultation. 2/12  Patient had rapid A-fib, given 5 mg of metoprolol IV.  Also given his home medications and Dilaudid 0.5 mg.  Had transient unresponsiveness and hypotension.  Transferring to ICU. 2/14, stabilized.  Transiently on Levophed.  Out of ICU.  Difficulty with mobility.  Subjective:  Patient seen and examined.  He has some dry cough that gives him some chest pain on coughing.  Denies any other complaints.  Pain is controlled at rest.  He is nervous about moving.  Heart rate is fairly controlled today and mostly less than 100.    Assessment & Plan:   Transient hypotension/hypovolemic shock/medication induced drowsiness or bradycardia: Became hypotensive and transiently needed high flow oxygen. Did not respond to IV fluid resuscitation.  He was given peripheral Levophed and transferred to ICU for 1 day. Cultures negative.  Adequately improved.  Now on room air. Presumptive aspiration pneumonia, completed 5 days of Augmentin. Start mobilizing.  Closed traumatic intertrochanteric and periprosthetic fracture right femur: Status post ORIF Dr. Jena Gauss 2/10 Weightbearing as tolerated.  DVT prophylaxis with Eliquis now. Adequate pain medications.  PT OT and postoperative management as per surgery.   Persistent A-fib: Mostly chronic.  On Eliquis.  Rate was not controlled.  Increased metoprolol to 37.5 mg twice daily.  Will keep on current doses.  Chronic medical issues  Severe peripheral neuropathy, anxiety depression: Continue Cymbalta Currently tolerating gabapentin 600 mg 3  times daily.   Continue Requip 0.5 mg 3 times daily as needed. Seroquel 300 mg at night -half of the home dose.  Planning to keep on 300 mg at night.  Anemia of acute blood loss: Anticipated anemia due to long bone fracture.  Baseline hemoglobin about 11.   Received total 2 units of prbc. Stable now.   AKI on CKD stage IIIb: Baseline historic creatinine 1.4-1.7.  Creatinine increased to 2.51. Likely hemodynamic mediated due to low blood pressure.   Resuscitated and trending down.   Continue to work with PT OT.  Oral pain medications to manage pain.  Discontinue Foley catheter.  Anticipate discharge to a SNF in 24 to 48 hours.   DVT prophylaxis: SCDs Start: 03/31/23 1343 apixaban (ELIQUIS) tablet 5 mg   Code Status: DNR with limited intervention.   Family Communication: None today. Disposition Plan: Status is: Inpatient Remains inpatient appropriate because: Stabilizing.  Stable to go to SNF.   Consultants:  Orthopedics Critical care  Procedures:  ORIF right hip  Antimicrobials:  Augmentin 2/13--- 2/17.     Objective: Vitals:   04/06/23 1919 04/07/23 0611 04/07/23 0908 04/07/23 0933  BP: (!) 115/56 (!) 103/54 (!) 116/59   Pulse: (!) 103 97  (!) 103  Resp: 17 18 18    Temp: 98 F (36.7 C) 98.1 F (36.7 C) 98.2 F (36.8 C)   TempSrc: Oral Oral Oral   SpO2: 92% 97% 96%   Weight:      Height:        Intake/Output Summary (Last 24 hours) at 04/07/2023 1057 Last data filed at 04/07/2023 0600 Gross per 24 hour  Intake --  Output 1100 ml  Net -1100 ml   Filed Weights   03/30/23 1017 04/02/23 1304  Weight: 95.3 kg 100.3 kg    Examination:  General: Looks fairly comfortable at rest.  Alert awake.  Slightly anxious. Cardiovascular: S1-S2 normal.  Irregularly irregular.   Respiratory: Bilateral clear.  No added sounds.  On room air today. Gastrointestinal: Soft and nontender.  Bowel sound present. Ext: Right lateral thigh incision clean and intact.   He is not  edematous right leg.  Distal neurovascular status intact.  Extensive ecchymosis.      Data Reviewed: I have personally reviewed following labs and imaging studies  CBC: Recent Labs  Lab 04/02/23 1103 04/02/23 1552 04/03/23 0309 04/04/23 0534 04/05/23 0521  WBC 8.8 11.8* 9.4 8.2 6.4  NEUTROABS 6.2  --   --   --   --   HGB 8.0* 7.9* 8.3* 8.8* 8.7*  HCT 24.3* 24.5* 25.1* 26.9* 27.1*  MCV 97.2 97.6 95.1 94.4 95.1  PLT 111* 116* 111* 120* 129*   Basic Metabolic Panel: Recent Labs  Lab 04/02/23 1103 04/03/23 0309 04/04/23 0534 04/05/23 0521 04/06/23 0412  NA 133* 137 130* 133* 133*  K 4.3 4.2 3.9 3.7 3.9  CL 99 103 98 100 100  CO2 22 26 23 25 23   GLUCOSE 156* 130* 112* 106* 116*  BUN 31* 30* 24* 20 19  CREATININE 2.51* 2.03* 1.42* 1.53* 1.39*  CALCIUM 7.7* 7.8* 7.8* 8.1* 8.3*  MG 1.7 2.1  --   --   --   PHOS 2.3* 2.5  --   --   --    GFR: Estimated Creatinine Clearance: 52.7 mL/min (A) (by C-G formula based on SCr of 1.39 mg/dL (H)). Liver Function Tests: Recent Labs  Lab 04/02/23 1103 04/03/23 0309 04/04/23 0534 04/05/23 0521 04/06/23 0412  AST 458* 444* 273* 212* 117*  ALT 140* 104* 117* 125* 99*  ALKPHOS 61 64 79 83 99  BILITOT 0.9 1.0 1.4* 1.5* 2.0*  PROT 5.3* 5.4* 5.8* 5.9* 6.0*  ALBUMIN 2.8* 2.8* 2.8* 2.8* 2.9*   No results for input(s): "LIPASE", "AMYLASE" in the last 168 hours. No results for input(s): "AMMONIA" in the last 168 hours. Coagulation Profile: No results for input(s): "INR", "PROTIME" in the last 168 hours.  Cardiac Enzymes: No results for input(s): "CKTOTAL", "CKMB", "CKMBINDEX", "TROPONINI" in the last 168 hours. BNP (last 3 results) No results for input(s): "PROBNP" in the last 8760 hours. HbA1C: No results for input(s): "HGBA1C" in the last 72 hours. CBG: Recent Labs  Lab 04/02/23 1133 04/02/23 1252  GLUCAP 145* 145*   Lipid Profile: No results for input(s): "CHOL", "HDL", "LDLCALC", "TRIG", "CHOLHDL", "LDLDIRECT" in the  last 72 hours. Thyroid Function Tests: No results for input(s): "TSH", "T4TOTAL", "FREET4", "T3FREE", "THYROIDAB" in the last 72 hours. Anemia Panel: No results for input(s): "VITAMINB12", "FOLATE", "FERRITIN", "TIBC", "IRON", "RETICCTPCT" in the last 72 hours. Sepsis Labs: Recent Labs  Lab 04/02/23 1218 04/02/23 1552  LATICACIDVEN 2.2* 1.8    Recent Results (from the past 240 hours)  Surgical PCR screen     Status: None   Collection Time: 03/31/23  5:00 AM   Specimen: Nasal Mucosa; Nasal Swab  Result Value Ref Range Status   MRSA, PCR NEGATIVE NEGATIVE Final   Staphylococcus aureus NEGATIVE NEGATIVE Final    Comment: (NOTE) The Xpert SA Assay (FDA approved for NASAL specimens in patients 65 years of age and older), is one component of a comprehensive surveillance program. It is not intended to diagnose infection  nor to guide or monitor treatment. Performed at [facility], [address], [place], [place] [address]   Culture, blood (Routine X 2) w Reflex to ID Panel     Status: None   Collection Time: 04/02/23 12:18 PM   Specimen: BLOOD RIGHT ARM  Result Value Ref Range Status   Specimen Description BLOOD RIGHT ARM  Final   Special Requests   Final    BOTTLES DRAWN AEROBIC AND ANAEROBIC Blood Culture adequate volume   Culture   Final    NO GROWTH 5 DAYS Performed at [facility], [address] [place], [place] [address]    Report Status 04/07/2023 FINAL  Final  Culture, blood (Routine X 2) w Reflex to ID Panel     Status: None   Collection Time: 04/02/23  3:52 PM   Specimen: BLOOD RIGHT HAND  Result Value Ref Range Status   Specimen Description BLOOD RIGHT HAND  Final   Special Requests   Final    BOTTLES DRAWN AEROBIC AND ANAEROBIC Blood Culture results may not be optimal due to an inadequate volume of blood received in culture bottles   Culture   Final    NO GROWTH 5 DAYS Performed at [facility], [address] [place], [place] [address]     Report Status 04/07/2023 FINAL  Final         Radiology Studies: No results found.       Scheduled Meds:  sodium chloride   Intravenous Once   apixaban  5 mg Oral BID   Chlorhexidine Gluconate Cloth  6 each Topical Daily   docusate sodium  100 mg Oral BID   DULoxetine  60 mg Oral Daily   ferrous sulfate  325 mg Oral Q breakfast   gabapentin  600 mg Oral TID   metoprolol tartrate  37.5 mg Oral BID   multivitamin with minerals  1 tablet Oral Daily   pantoprazole  40 mg Oral Daily   polyethylene glycol  17 g Oral BID   QUEtiapine  300 mg Oral QHS   senna-docusate  2 tablet Oral BID   sodium chloride flush  3 mL Intravenous Q12H   Vitamin D (Ergocalciferol)  50,000 Units Oral Weekly   Continuous Infusions:     LOS: 8 days    Time spent: 40 minutes    Dorcas Carrow, MD [facility]

## 2023-04-07 NOTE — TOC Progression Note (Signed)
Transition of Care [facility]) - Progression Note    Patient Details  Name: Dustin [name] MRN: [id number] Date of Birth: 06/26/1940  [facility]) CM/SW Contact  Lorri Frederick, LCSW Phone Number: 04/07/2023, 8:33 AM  Clinical Narrative:   SNF auth request remains pending in [place].  PASSR: [id number]    Expected Discharge Plan: Skilled Nursing Facility Barriers to Discharge: Continued Medical Work up  Expected Discharge Plan and Services In-house Referral: Clinical Social Work   Post Acute Care Choice: Skilled Nursing Facility Living arrangements for the past 2 months: Single Family Home                                       Social Determinants of Health (SDOH) Interventions SDOH Screenings   Food Insecurity: No Food Insecurity (03/30/2023)  Housing: Low Risk  (03/30/2023)  Transportation Needs: No Transportation Needs (03/30/2023)  Utilities: Not At Risk (03/30/2023)  Tobacco Use: Medium Risk (03/31/2023)    Readmission Risk Interventions    06/13/2022   10:53 AM  Readmission Risk Prevention Plan  Transportation Screening Complete  PCP or Specialist Appt within 5-7 Days Complete  Home Care Screening Complete  Medication Review (RN CM) Complete

## 2023-04-07 NOTE — Progress Notes (Signed)
Physical Therapy Treatment  Patient Details Name: Dustin [name] MRN: [id number] DOB: [birth year] Today's Date: 04/07/2023   History of Present Illness Pt is an 83 y/o male who presents s/p Fall, sustaining a R intertrochanteric femoral neck periprosthetic fracture. He is now s/p ORIF on 03/31/2023. PMH significant for BPH, CHF, neuropathy, 4 level ACDF 2014, R IM nail 2022.    PT Comments  At the time of PT eval pt was able to perform transfers with [facility] and +2 assist. Pain limiting mobility but pt agreeable overall and with a fair rehab effort. Acute PT recommendations remain appropriate at this time. Will continue to follow.     If plan is discharge home, recommend the following: Two people to help with walking and/or transfers;Two people to help with bathing/dressing/bathroom;Assistance with cooking/housework;Assist for transportation;Help with stairs or ramp for entrance;Assistance with feeding;Direct supervision/assist for medications management;Direct supervision/assist for financial management;Supervision due to cognitive status   Can travel by private vehicle     No  Equipment Recommendations  None recommended by PT (TBD by next venue of care)    Recommendations for Other Services       Precautions / Restrictions Precautions Precautions: Fall Recall of Precautions/Restrictions: Impaired Precaution/Restrictions Comments: Pt hesitant to move RLE at all Restrictions Weight Bearing Restrictions Per Provider Order: Yes RLE Weight Bearing Per Provider Order: Weight bearing as tolerated     Mobility  Bed Mobility Overal bed mobility: Needs Assistance Bed Mobility: Supine to Sit     Supine to sit: HOB elevated, Used rails, Max assist, +2 for physical assistance     General bed mobility comments: +2 assist and bed pad utilized for transition to EOB. Pt with little to no initiation of movement on the RLE and with significant pain when LE was assisted    Transfers Overall  transfer level: Needs assistance Equipment used: Ambulation equipment used Transfers: Sit to/from Stand, Bed to chair/wheelchair/BSC Sit to Stand: From elevated surface, Max assist, +2 physical assistance, Mod assist, Via lift equipment           General transfer comment: Heavy +2 assist from EOB to stand to the [place]. To stand from propped position on Stedy support, pt only required +1 assist. Good controlled lower for stand>sit to recliner at end of session. Transfer via Lift Equipment: Stedy  Ambulation/Gait               General Gait Details: Unable at this time.   Stairs             Wheelchair Mobility     Tilt Bed    Modified Rankin (Stroke Patients Only)       Balance Overall balance assessment: Needs assistance Sitting-balance support: Feet supported, Bilateral upper extremity supported Sitting balance-Leahy Scale: Fair     Standing balance support: Bilateral upper extremity supported, During functional activity, Reliant on assistive device for balance Standing balance-Leahy Scale: Poor Standing balance comment: +2 assist and platform lift for static standing                            Communication Communication Communication: Impaired Factors Affecting Communication: Hearing impaired (hearing aids)  Cognition Arousal: Lethargic Behavior During Therapy: Anxious, Flat affect   PT - Cognitive impairments: No family/caregiver present to determine baseline, Initiation, Sequencing, Problem solving, Safety/Judgement, Memory, Attention  Following commands: Intact      Cueing Cueing Techniques: Verbal cues, Gestural cues  Exercises      General Comments        Pertinent Vitals/Pain Pain Assessment Pain Assessment: Faces Faces Pain Scale: Hurts whole lot Pain Location: R hip with movement, even minimal amounts Pain Descriptors / Indicators: Operative site guarding, Sore, Grimacing, Moaning,  Constant, Sharp Pain Intervention(s): Limited activity within patient's tolerance, Monitored during session, Repositioned    Home Living                          Prior Function            PT Goals (current goals can now be found in the care plan section) Acute Rehab PT Goals Patient Stated Goal: Less pain so I can go home PT Goal Formulation: With patient/family Time For Goal Achievement: 04/15/23 Potential to Achieve Goals: Good Progress towards PT goals: Progressing toward goals    Frequency    Min 1X/week      PT Plan      Co-evaluation              AM-PAC PT "6 Clicks" Mobility   Outcome Measure  Help needed turning from your back to your side while in a flat bed without using bedrails?: Total Help needed moving from lying on your back to sitting on the side of a flat bed without using bedrails?: Total Help needed moving to and from a bed to a chair (including a wheelchair)?: Total Help needed standing up from a chair using your arms (e.g., wheelchair or bedside chair)?: Total Help needed to walk in hospital room?: Total Help needed climbing 3-5 steps with a railing? : Total 6 Click Score: 6    End of Session Equipment Utilized During Treatment: Gait belt Activity Tolerance: Patient limited by pain;Patient limited by lethargy Patient left: in chair;with call bell/phone within reach;with chair alarm set Nurse Communication: Mobility status;Need for lift equipment;Patient requests pain meds;Precautions PT Visit Diagnosis: Unsteadiness on feet (R26.81);Pain;History of falling (Z91.81);Difficulty in walking, not elsewhere classified (R26.2) Pain - Right/Left: Right Pain - part of body: Hip     Time: 1010-1035 PT Time Calculation (min) (ACUTE ONLY): 25 min  Charges:    $Gait Training: 23-37 mins PT General Charges $$ ACUTE PT VISIT: 1 Visit                     Conni Slipper, PT, DPT Acute Rehabilitation Services Secure Chat  Preferred Office: [phone]    Marylynn Pearson 04/07/2023, 11:54 AM

## 2023-04-08 ENCOUNTER — Encounter (HOSPITAL_COMMUNITY): Payer: Self-pay | Admitting: Student

## 2023-04-08 DIAGNOSIS — S72001A Fracture of unspecified part of neck of right femur, initial encounter for closed fracture: Secondary | ICD-10-CM | POA: Diagnosis not present

## 2023-04-08 DIAGNOSIS — J9601 Acute respiratory failure with hypoxia: Secondary | ICD-10-CM | POA: Diagnosis not present

## 2023-04-08 LAB — CBC WITH DIFFERENTIAL/PLATELET
Abs Immature Granulocytes: 0.17 10*3/uL — ABNORMAL HIGH (ref 0.00–0.07)
Basophils Absolute: 0 10*3/uL (ref 0.0–0.1)
Basophils Relative: 0 %
Eosinophils Absolute: 0.5 10*3/uL (ref 0.0–0.5)
Eosinophils Relative: 6 %
HCT: 27 % — ABNORMAL LOW (ref 39.0–52.0)
Hemoglobin: 8.7 g/dL — ABNORMAL LOW (ref 13.0–17.0)
Immature Granulocytes: 2 %
Lymphocytes Relative: 13 %
Lymphs Abs: 1.1 10*3/uL (ref 0.7–4.0)
MCH: 30.1 pg (ref 26.0–34.0)
MCHC: 32.2 g/dL (ref 30.0–36.0)
MCV: 93.4 fL (ref 80.0–100.0)
Monocytes Absolute: 0.9 10*3/uL (ref 0.1–1.0)
Monocytes Relative: 12 %
Neutro Abs: 5.3 10*3/uL (ref 1.7–7.7)
Neutrophils Relative %: 67 %
Platelets: 210 10*3/uL (ref 150–400)
RBC: 2.89 MIL/uL — ABNORMAL LOW (ref 4.22–5.81)
RDW: 14.7 % (ref 11.5–15.5)
WBC: 8 10*3/uL (ref 4.0–10.5)
nRBC: 0 % (ref 0.0–0.2)

## 2023-04-08 MED ORDER — GABAPENTIN 300 MG PO CAPS: 600.0000 mg | ORAL_CAPSULE | Freq: Three times a day (TID) | ORAL | 0 refills | Status: DC

## 2023-04-08 MED ORDER — QUETIAPINE FUMARATE 300 MG PO TABS: 300.0000 mg | ORAL_TABLET | Freq: Every day | ORAL | 0 refills | Status: AC

## 2023-04-08 MED ORDER — POLYETHYLENE GLYCOL 3350 17 G PO PACK: 17.0000 g | PACK | Freq: Two times a day (BID) | ORAL | Status: DC

## 2023-04-08 MED ORDER — SENNOSIDES-DOCUSATE SODIUM 8.6-50 MG PO TABS: 2.0000 | ORAL_TABLET | Freq: Two times a day (BID) | ORAL | Status: DC

## 2023-04-08 MED ORDER — ROPINIROLE HCL 0.5 MG PO TABS: 0.5000 mg | ORAL_TABLET | Freq: Three times a day (TID) | ORAL | Status: DC | PRN

## 2023-04-08 MED ORDER — METOPROLOL TARTRATE 37.5 MG PO TABS: 37.5000 mg | ORAL_TABLET | Freq: Two times a day (BID) | ORAL | Status: DC

## 2023-04-08 MED ORDER — BENZONATATE 100 MG PO CAPS: 100.0000 mg | ORAL_CAPSULE | Freq: Three times a day (TID) | ORAL | Status: DC | PRN

## 2023-04-08 NOTE — Plan of Care (Signed)
   Problem: Education: Goal: Knowledge of General Education information will improve Description: Including pain rating scale, medication(s)/side effects and non-pharmacologic comfort measures Outcome: Progressing   Problem: Clinical Measurements: Goal: Ability to maintain clinical measurements within normal limits will improve Outcome: Progressing   Problem: Activity: Goal: Risk for activity intolerance will decrease Outcome: Progressing   Problem: Nutrition: Goal: Adequate nutrition will be maintained Outcome: Progressing   Problem: Pain Managment: Goal: General experience of comfort will improve and/or be controlled Outcome: Progressing   Problem: Safety: Goal: Ability to remain free from injury will improve Outcome: Progressing   Problem: Skin Integrity: Goal: Risk for impaired skin integrity will decrease Outcome: Progressing

## 2023-04-08 NOTE — TOC Transition Note (Signed)
Transition of Care [name]) - Discharge Note   Patient Details  Name: Dustin [name] MRN: [id number] Date of Birth: 07/17/1940  Transition of Care [facility]) CM/SW Contact:  Lorri Frederick, LCSW Phone Number: 04/08/2023, 11:42 AM   Clinical Narrative: Pt discharging to [facility].  RN call report to  [phone].    Final next level of care: Skilled Nursing Facility Barriers to Discharge: Barriers Resolved   Patient Goals and CMS Choice Patient states their goals for this hospitalization and ongoing recovery are:: SNF CMS Medicare.gov Compare Post Acute Care list provided to:: Patient Choice offered to / list presented to : Patient      Discharge Placement              Patient [facility] bed at: [facility] Patient to be transferred to facility by: ptar Name of family member notified: granddaughter Rosanne Sack Patient and family notified of of transfer: 04/08/23  Discharge Plan and Services Additional resources added to the After Visit Summary for   In-house Referral: Clinical Social Work   Post Acute Care Choice: Skilled Nursing Facility                               Social Drivers of Health (SDOH) Interventions SDOH Screenings   Food Insecurity: No Food Insecurity (03/30/2023)  Housing: Low Risk  (03/30/2023)  Transportation Needs: No Transportation Needs (03/30/2023)  Utilities: Not At Risk (03/30/2023)  Tobacco Use: Medium Risk (03/31/2023)     Readmission Risk Interventions    06/13/2022   10:53 AM  Readmission Risk Prevention Plan  Transportation Screening Complete  PCP or Specialist Appt within 5-7 Days Complete  Home Care Screening Complete  Medication Review (RN CM) Complete

## 2023-04-08 NOTE — Care Management Important Message (Signed)
Important Message  Patient Details  Name: Dustin [name] MRN: [id number] Date of Birth: 1940-05-28   Important Message Given:  Yes - Medicare IM     Sherilyn Banker 04/08/2023, 3:05 PM

## 2023-04-08 NOTE — TOC Progression Note (Addendum)
Transition of Care [facility]) - Progression Note    Patient Details  Name: Dustin [name] MRN: [id number] Date of Birth: 07-17-1940  Transition [facility]) CM/SW Contact  Lorri Frederick, LCSW Phone Number: 04/08/2023, 8:18 AM  Clinical Narrative:   SNF auth approved: [id number], 4 days: 2/16-2/19.  CSW confirmed with [facility] that they can receive pt today.  MD informed.   Expected Discharge Plan: Skilled Nursing Facility Barriers to Discharge: Continued Medical Work up  Expected Discharge Plan and Services In-house Referral: Clinical Social Work   Post Acute Care Choice: Skilled Nursing Facility Living arrangements for the past 2 months: Single Family Home                                       Social Determinants of Health (SDOH) Interventions SDOH Screenings   Food Insecurity: No Food Insecurity (03/30/2023)  Housing: Low Risk  (03/30/2023)  Transportation Needs: No Transportation Needs (03/30/2023)  Utilities: Not At Risk (03/30/2023)  Tobacco Use: Medium Risk (03/31/2023)    Readmission Risk Interventions    06/13/2022   10:53 AM  Readmission Risk Prevention Plan  Transportation Screening Complete  PCP or Specialist Appt within 5-7 Days Complete  Home Care Screening Complete  Medication Review (RN CM) Complete

## 2023-04-08 NOTE — Care Management Important Message (Signed)
Important Message  Patient Details  Name: Dustin [name] MRN: [id number] Date of Birth: 1940-02-27   Important Message Given:  Yes - Medicare IM   Patient left prior to IM delivery will mail a copy to the patient home address.   Charlese Gruetzmacher 04/08/2023, 4:25 PM

## 2023-04-08 NOTE — Progress Notes (Signed)
Patient discharging to [facility], report given to nurse Nyesha at sniff, patient discharging via ptar, discharge packet given to ptar. IV access removed.

## 2023-04-08 NOTE — Discharge Summary (Signed)
Physician Discharge Summary  WOOD NOVACEK ZOX:[id number] DOB: 1940-04-28 DOA: 03/30/2023  PCP: Darrow Bussing, MD  Admit date: 03/30/2023 Discharge date: 04/08/2023  Admitted From: Home Disposition: Skilled nursing facility  Recommendations for Outpatient Follow-up:  Follow up with PCP in 1-2 weeks Please obtain BMP/CBC in one week Orthopedics to schedule follow-up   Discharge Condition: Stable CODE STATUS: DNR with limited intervention Diet recommendation: Low-salt diet  Discharge summary: 83 year old with history of chronic A-fib on Eliquis, history of hip fracture with IM nail presented with mechanical fall and severe pain right thigh area.  In the emergency room he was found to have right intertrochanteric femoral neck fracture periprosthetic.  Admitted with surgical consultation. 2/12  Patient had rapid A-fib, given 5 mg of metoprolol IV.  Also given his home medications and Dilaudid 0.5 mg.  Had transient unresponsiveness and hypotension. stabilized.  Transiently on Levophed.  Out of ICU.  Difficulty with mobility.  Medically stabilized now to go to a skilled level of care.     Assessment & plan of care:   Transient hypotension/hypovolemic shock/medication induced drowsiness or bradycardia: Became hypotensive and transiently needed high flow oxygen. Did not respond to IV fluid resuscitation.  He was given peripheral Levophed and transferred to ICU for 1 day. Cultures negative.  Adequately improved.  Now on room air. Presumptive aspiration pneumonia, completed 5 days of Augmentin. Improved.  Does have some dry cough, can use Tessalon Perles.   Closed traumatic intertrochanteric and periprosthetic fracture right femur: Status post ORIF Dr. Jena Gauss 2/10 Weightbearing as tolerated.  DVT prophylaxis with Eliquis now. Adequate pain medications.  PT OT and postoperative management and for up as per orthopedic surgery.   Persistent A-fib: Mostly chronic.  On Eliquis.  Rate was not  controlled.  Increased metoprolol to 37.5 mg twice daily.  Will keep on current doses.  Episodic tachycardia anticipated however now is stabilizing.   Severe peripheral neuropathy, anxiety depression:  Continue Cymbalta Currently tolerating gabapentin 600 mg 3 times daily.   Continue Requip 0.5 mg 3 times daily as needed. Seroquel 300 mg at night -half of the home dose.  Planning to keep on 300 mg at night.   Anemia of acute blood loss: Anticipated anemia due to long bone fracture.  Baseline hemoglobin about 11.  Received total 2 units of prbc. Stable now.    AKI on CKD stage IIIb: Baseline historic creatinine 1.4-1.7.  Creatinine increased to 2.51. Likely hemodynamic mediated due to low blood pressure.  Normalized.  Recheck in 1 week.    Continue to work with PT OT.  Oral pain medications to manage pain.  Successful voiding trial.  Stable to transition to skilled level of care.     Discharge Diagnoses:  Principal Problem:   Femur fracture (HCC) Active Problems:   Acute hypoxemic respiratory failure (HCC)   Persistent atrial fibrillation (HCC)   Thrombocytopenia (HCC)   Pain   Femoral fracture [facility])    Discharge Instructions  Discharge Instructions     Diet - low sodium heart healthy   Complete by: As directed    Increase activity slowly   Complete by: As directed       Allergies as of 04/08/2023       Reactions   Desmopressin Acetate Other (See Comments)   Hyponatremia    Zoloft [sertraline] Other (See Comments)   Felt bad - Patient denies        Medication List     TAKE these medications  acetaminophen 325 MG tablet Commonly known as: TYLENOL Take 2 tablets (650 mg total) by mouth every 6 (six) hours as needed for mild pain (pain score 1-3), fever or headache (or Fever >/= 101).   apixaban 5 MG Tabs tablet Commonly known as: ELIQUIS Take 5 mg by mouth 2 (two) times daily.   benzonatate 100 MG capsule Commonly known as: TESSALON Take 1 capsule (100  mg total) by mouth 3 (three) times daily as needed for cough.   Denta 5000 Plus 1.1 % Crea dental cream Generic drug: sodium fluoride Place 1 Application onto teeth in the morning and at bedtime.   DULoxetine 60 MG capsule Commonly known as: CYMBALTA Take 60 mg by mouth daily.   Farxiga 10 MG Tabs tablet Generic drug: dapagliflozin propanediol Take 10 mg by mouth daily.   ferrous sulfate 325 (65 FE) MG tablet Take 1 tablet (325 mg total) by mouth daily with breakfast.   fluorouracil 5 % cream Commonly known as: EFUDEX Apply 1 Application topically 2 (two) times daily. For left ear   gabapentin 300 MG capsule Commonly known as: NEURONTIN Take 2 capsules (600 mg total) by mouth 3 (three) times daily.   ibuprofen 200 MG tablet Commonly known as: ADVIL Take 400 mg by mouth as needed for moderate pain.   Metoprolol Tartrate 37.5 MG Tabs Take 1 tablet (37.5 mg total) by mouth 2 (two) times daily. What changed:  medication strength how much to take   multivitamin with minerals Tabs tablet Take 1 tablet by mouth daily.   Oxycodone HCl 10 MG Tabs Take 0.5-1 tablets (5-10 mg total) by mouth every 4 (four) hours as needed for severe pain (pain score 7-10). What changed:  how much to take when to take this reasons to take this   pantoprazole 40 MG tablet Commonly known as: PROTONIX Take 1 tablet (40 mg total) by mouth daily.   polyethylene glycol 17 g packet Commonly known as: MIRALAX / GLYCOLAX Take 17 g by mouth 2 (two) times daily.   QUEtiapine 300 MG tablet Commonly known as: SEROQUEL Take 1 tablet (300 mg total) by mouth at bedtime. What changed: how much to take   rOPINIRole 0.5 MG tablet Commonly known as: REQUIP Take 1 tablet (0.5 mg total) by mouth 3 (three) times daily as needed (Restless).   senna-docusate 8.6-50 MG tablet Commonly known as: Senokot-S Take 2 tablets by mouth 2 (two) times daily.   silodosin 8 MG Caps capsule Commonly known as:  RAPAFLO Take 8 mg by mouth daily.   Vitamin D (Ergocalciferol) 1.25 MG (50000 UNIT) Caps capsule Commonly known as: DRISDOL Take 50,000 Units by mouth once a week. Thursdays        Contact information for follow-up providers     Koirala, Dibas, MD Follow up.   Specialty: Family Medicine Contact information: [address] [place] [address] [phone]         Roby Lofts, MD. Schedule an appointment as soon as possible for a visit in 2 week(s).   Specialty: Orthopedic Surgery Why: for wound check and repeat x-rays Contact information: [address] [place] [address] [phone]              Contact information for after-discharge care     Destination     [facility], [facility] .   Service: Skilled Nursing Contact information: [address] [place] [address] [phone]  Allergies  Allergen Reactions   Desmopressin Acetate Other (See Comments)    Hyponatremia    Zoloft [Sertraline] Other (See Comments)    Felt bad - Patient denies    Consultations: Orthopedics Critical care   Procedures/Studies: DG CHEST PORT 1 VIEW Result Date: 04/02/2023 CLINICAL DATA:  Hypoxia EXAM: PORTABLE CHEST 1 VIEW COMPARISON:  03/30/2023 FINDINGS: Cardiomegaly. Coarsened interstitial markings bilaterally. Increasing interstitial opacity at the left lung base. No pleural effusion or pneumothorax. IMPRESSION: Increasing interstitial opacity at the left lung base, which may represent atelectasis or pneumonia. Electronically Signed   By: Duanne Guess D.O.   On: 04/02/2023 12:42   DG FEMUR PORT, MIN 2 VIEWS RIGHT Result Date: 03/31/2023 CLINICAL DATA:  Operative fixation of a right intertrochanteric fracture. EXAM: RIGHT FEMUR PORTABLE 2 VIEW COMPARISON:  03/30/2023 and C-arm images obtained earlier today. FINDINGS: Interval compression screw and rod fixation of  the recently demonstrated right intertrochanteric fracture with satisfactory position and alignment. Old, healed distal right femur fracture. The previous distal femur fixation hardware has been removed. No new fracture or dislocation. IMPRESSION: Interval compression screw and rod fixation of the recently demonstrated right intertrochanteric fracture with satisfactory position and alignment. Electronically Signed   By: Beckie Salts M.D.   On: 03/31/2023 17:20   DG FEMUR, MIN 2 VIEWS RIGHT Result Date: 03/31/2023 CLINICAL DATA:  Elective surgery. Right femur hardware removal and new femur fixation. EXAM: RIGHT FEMUR 2 VIEWS COMPARISON:  Hip and knee radiograph 03/30/2023 FINDINGS: Eight fluoroscopic spot views of the right femur submitted from the operating room. The previous femoral intramedullary nail in fixating screws has been removed. Subsequent short proximal femoral intramedullary nail with trans trochanteric and distal locking screw fixation traversing intertrochanteric femur fracture. Fluoroscopy time 1:12 0.7 seconds. Dose 18.81 mGy IMPRESSION: Procedural fluoroscopy during proximal femur fracture fixation and removal of previous femur hardware. Electronically Signed   By: Narda Rutherford M.D.   On: 03/31/2023 12:25   DG C-Arm 1-60 Min-No Report Result Date: 03/31/2023 Fluoroscopy was utilized by the requesting physician.  No radiographic interpretation.   DG Chest 1 View Result Date: 03/30/2023 CLINICAL DATA:  [id number] Preoperative cardiovascular examination [id number], right hip fracture EXAM: CHEST  1 VIEW COMPARISON:  11/12/2022 chest radiograph. FINDINGS: Partially visualized surgical hardware from ACDF. Stable cardiomediastinal silhouette with mild cardiomegaly. No pneumothorax. No pleural effusion. Lungs appear clear, with no acute consolidative airspace disease and no pulmonary edema. IMPRESSION: Mild cardiomegaly without pulmonary edema. No active pulmonary disease. Electronically Signed   By:  Delbert Phenix M.D.   On: 03/30/2023 12:51   DG Hip Unilat W or Wo Pelvis 2-3 Views Right Result Date: 03/30/2023 CLINICAL DATA:  Right knee and hip pain status post fall EXAM: RIGHT KNEE - COMPLETE 4+ VIEW; DG HIP (WITH OR WITHOUT PELVIS) 2-3V RIGHT COMPARISON:  Right femur radiographs 03/08/2020 FINDINGS: Right hip: Comminuted, mildly displaced right intertrochanteric femur fracture. Visualized portions of the right femur intramedullary rod are intact. The fracture does not clearly extend to the intramedullary rod, however this should be confirmed on CT. Multiple surgical clips in the midline pelvis likely due to prior prostatectomy. Right knee: No fracture or dislocation. Distal segment of femoral intramedullary rod with interlocking screws are intact without periprosthetic fracture or lucency. IMPRESSION: 1. Comminuted, mildly displaced right intertrochanteric femur fracture. 2. Visualized portions of the femoral intramedullary nail are intact. The proximal femur fracture does not definitively extend to the intramedullary rod, however if confirmation is needed, CT should be performed. 3.  No acute abnormality of the right knee. Electronically Signed   By: Acquanetta Belling M.D.   On: 03/30/2023 11:24   DG Knee Complete 4 Views Right Result Date: 03/30/2023 CLINICAL DATA:  Right knee and hip pain status post fall EXAM: RIGHT KNEE - COMPLETE 4+ VIEW; DG HIP (WITH OR WITHOUT PELVIS) 2-3V RIGHT COMPARISON:  Right femur radiographs 03/08/2020 FINDINGS: Right hip: Comminuted, mildly displaced right intertrochanteric femur fracture. Visualized portions of the right femur intramedullary rod are intact. The fracture does not clearly extend to the intramedullary rod, however this should be confirmed on CT. Multiple surgical clips in the midline pelvis likely due to prior prostatectomy. Right knee: No fracture or dislocation. Distal segment of femoral intramedullary rod with interlocking screws are intact without  periprosthetic fracture or lucency. IMPRESSION: 1. Comminuted, mildly displaced right intertrochanteric femur fracture. 2. Visualized portions of the femoral intramedullary nail are intact. The proximal femur fracture does not definitively extend to the intramedullary rod, however if confirmation is needed, CT should be performed. 3. No acute abnormality of the right knee. Electronically Signed   By: Acquanetta Belling M.D.   On: 03/30/2023 11:24   (Echo, Carotid, EGD, Colonoscopy, ERCP)    Subjective: Patient seen and examined in the morning rounds.  Still has some mild to moderate pain but otherwise he is alert awake.  Telemetry monitor with mostly rate controlled A-fib.  Right thigh is large and edematous.  Agreeable with plan to go to SNF.  Patient has some dry cough, has some intercostal pain on coughing.   Discharge Exam: Vitals:   04/07/23 2030 04/08/23 0427  BP: 115/69 (!) 101/58  Pulse: 77 90  Resp: 18 18  Temp: 98.7 F (37.1 C) 98.4 F (36.9 C)  SpO2: 95% 93%   Vitals:   04/07/23 0933 04/07/23 1618 04/07/23 2030 04/08/23 0427  BP:  111/66 115/69 (!) 101/58  Pulse: (!) 103 96 77 90  Resp:   18 18  Temp:   98.7 F (37.1 C) 98.4 F (36.9 C)  TempSrc:    Oral  SpO2:   95% 93%  Weight:      Height:        General: Pt is alert, awake, not in acute distress Cardiovascular: RRR, S1/S2 +, no rubs, no gallops Respiratory: CTA bilaterally, no wheezing, no rhonchi Abdominal: Soft, NT, ND, bowel sounds + Extremities:  Edematous and ecchymotic area on the right thigh, lateral thigh incision intact.  Swelling up to the knees.  Distal neurovascular status intact.    The results of significant diagnostics from this hospitalization (including imaging, microbiology, ancillary and laboratory) are listed below for reference.     Microbiology: Recent Results (from the past 240 hours)  Surgical PCR screen     Status: None   Collection Time: 03/31/23  5:00 AM   Specimen: Nasal Mucosa;  Nasal Swab  Result Value Ref Range Status   MRSA, PCR NEGATIVE NEGATIVE Final   Staphylococcus aureus NEGATIVE NEGATIVE Final    Comment: (NOTE) The Xpert SA Assay (FDA approved for NASAL specimens in patients 10 years of age and older), is one component of a comprehensive surveillance program. It is not intended to diagnose infection nor to guide or monitor treatment. Performed at [facility], [address], [place], [place] [address]   Culture, blood (Routine X 2) w Reflex to ID Panel     Status: None   Collection Time: 04/02/23 12:18 PM   Specimen: BLOOD RIGHT ARM  Result Value Ref Range Status   Specimen Description BLOOD RIGHT ARM  Final   Special Requests   Final    BOTTLES DRAWN AEROBIC AND ANAEROBIC Blood Culture adequate volume   Culture   Final    NO GROWTH 5 DAYS Performed at [facility], [address], [place], [place] [address]    Report Status 04/07/2023 FINAL  Final  Culture, blood (Routine X 2) w Reflex to ID Panel     Status: None   Collection Time: 04/02/23  3:52 PM   Specimen: BLOOD RIGHT HAND  Result Value Ref Range Status   Specimen Description BLOOD RIGHT HAND  Final   Special Requests   Final    BOTTLES DRAWN AEROBIC AND ANAEROBIC Blood Culture results may not be optimal due to an inadequate volume of blood received in culture bottles   Culture   Final    NO GROWTH 5 DAYS Performed at [facility], [address], [place], [place] [address]    Report Status 04/07/2023 FINAL  Final     Labs: BNP (last 3 results) Recent Labs    11/13/22 0649  BNP 444.6*   Basic Metabolic Panel: Recent Labs  Lab 04/02/23 1103 04/03/23 0309 04/04/23 0534 04/05/23 0521 04/06/23 0412  NA 133* 137 130* 133* 133*  K 4.3 4.2 3.9 3.7 3.9  CL 99 103 98 100 100  CO2 22 26 23 25 23   GLUCOSE 156* 130* 112* 106* 116*  BUN 31* 30* 24* 20 19  CREATININE 2.51* 2.03* 1.42* 1.53* 1.39*  CALCIUM 7.7* 7.8* 7.8* 8.1* 8.3*  MG 1.7 2.1  --   --   --    PHOS 2.3* 2.5  --   --   --    Liver Function Tests: Recent Labs  Lab 04/02/23 1103 04/03/23 0309 04/04/23 0534 04/05/23 0521 04/06/23 0412  AST 458* 444* 273* 212* 117*  ALT 140* 104* 117* 125* 99*  ALKPHOS 61 64 79 83 99  BILITOT 0.9 1.0 1.4* 1.5* 2.0*  PROT 5.3* 5.4* 5.8* 5.9* 6.0*  ALBUMIN 2.8* 2.8* 2.8* 2.8* 2.9*   No results for input(s): "LIPASE", "AMYLASE" in the last 168 hours. No results for input(s): "AMMONIA" in the last 168 hours. CBC: Recent Labs  Lab 04/02/23 1103 04/02/23 1552 04/03/23 0309 04/04/23 0534 04/05/23 0521 04/08/23 0637  WBC 8.8 11.8* 9.4 8.2 6.4 8.0  NEUTROABS 6.2  --   --   --   --  5.3  HGB 8.0* 7.9* 8.3* 8.8* 8.7* 8.7*  HCT 24.3* 24.5* 25.1* 26.9* 27.1* 27.0*  MCV 97.2 97.6 95.1 94.4 95.1 93.4  PLT 111* 116* 111* 120* 129* 210   Cardiac Enzymes: No results for input(s): "CKTOTAL", "CKMB", "CKMBINDEX", "TROPONINI" in the last 168 hours. BNP: Invalid input(s): "POCBNP" CBG: Recent Labs  Lab 04/02/23 1133 04/02/23 1252  GLUCAP 145* 145*   D-Dimer No results for input(s): "DDIMER" in the last 72 hours. Hgb A1c No results for input(s): "HGBA1C" in the last 72 hours. Lipid Profile No results for input(s): "CHOL", "HDL", "LDLCALC", "TRIG", "CHOLHDL", "LDLDIRECT" in the last 72 hours. Thyroid function studies No results for input(s): "TSH", "T4TOTAL", "T3FREE", "THYROIDAB" in the last 72 hours.  Invalid input(s): "FREET3" Anemia work up No results for input(s): "VITAMINB12", "FOLATE", "FERRITIN", "TIBC", "IRON", "RETICCTPCT" in the last 72 hours. Urinalysis    Component Value Date/Time   COLORURINE YELLOW 11/13/2022 0802   APPEARANCEUR CLEAR 11/13/2022 0802   LABSPEC 1.024 11/13/2022 0802  PHURINE 5.0 11/13/2022 0802   GLUCOSEU >=500 (A) 11/13/2022 0802   HGBUR NEGATIVE 11/13/2022 0802   BILIRUBINUR NEGATIVE 11/13/2022 0802   KETONESUR NEGATIVE 11/13/2022 0802   PROTEINUR NEGATIVE 11/13/2022 0802   NITRITE NEGATIVE  11/13/2022 0802   LEUKOCYTESUR NEGATIVE 11/13/2022 0802   Sepsis Labs Recent Labs  Lab 04/03/23 0309 04/04/23 0534 04/05/23 0521 04/08/23 0637  WBC 9.4 8.2 6.4 8.0   Microbiology Recent Results (from the past 240 hours)  Surgical PCR screen     Status: None   Collection Time: 03/31/23  5:00 AM   Specimen: Nasal Mucosa; Nasal Swab  Result Value Ref Range Status   MRSA, PCR NEGATIVE NEGATIVE Final   Staphylococcus aureus NEGATIVE NEGATIVE Final    Comment: (NOTE) The Xpert SA Assay (FDA approved for NASAL specimens in patients 14 years of age and older), is one component of a comprehensive surveillance program. It is not intended to diagnose infection nor to guide or monitor treatment. Performed at [facility], [address], [place], [place] [address]   Culture, blood (Routine X 2) w Reflex to ID Panel     Status: None   Collection Time: 04/02/23 12:18 PM   Specimen: BLOOD RIGHT ARM  Result Value Ref Range Status   Specimen Description BLOOD RIGHT ARM  Final   Special Requests   Final    BOTTLES DRAWN AEROBIC AND ANAEROBIC Blood Culture adequate volume   Culture   Final    NO GROWTH 5 DAYS Performed at [facility], [address] [place], [place] [address]    Report Status 04/07/2023 FINAL  Final  Culture, blood (Routine X 2) w Reflex to ID Panel     Status: None   Collection Time: 04/02/23  3:52 PM   Specimen: BLOOD RIGHT HAND  Result Value Ref Range Status   Specimen Description BLOOD RIGHT HAND  Final   Special Requests   Final    BOTTLES DRAWN AEROBIC AND ANAEROBIC Blood Culture results may not be optimal due to an inadequate volume of blood received in culture bottles   Culture   Final    NO GROWTH 5 DAYS Performed at [facility], [address], [place], [place] [address]    Report Status 04/07/2023 FINAL  Final     Time coordinating discharge: 40 minutes  SIGNED:   Dorcas Carrow, MD  [facility] 04/08/2023, 9:04 AM

## 2023-04-09 ENCOUNTER — Encounter (HOSPITAL_COMMUNITY): Payer: Self-pay | Admitting: Student

## 2023-07-04 ENCOUNTER — Ambulatory Visit: Attending: Nurse Practitioner | Admitting: Nurse Practitioner

## 2023-07-04 ENCOUNTER — Encounter: Payer: Self-pay | Admitting: Nurse Practitioner

## 2023-07-04 VITALS — BP 122/74 | HR 95 | Ht 74.0 in | Wt 215.2 lb

## 2023-07-04 DIAGNOSIS — I4819 Other persistent atrial fibrillation: Secondary | ICD-10-CM | POA: Diagnosis not present

## 2023-07-04 DIAGNOSIS — D6859 Other primary thrombophilia: Secondary | ICD-10-CM

## 2023-07-04 DIAGNOSIS — I519 Heart disease, unspecified: Secondary | ICD-10-CM | POA: Diagnosis not present

## 2023-07-04 NOTE — Progress Notes (Signed)
 Office Visit    Patient Name: Dustin [name] Date of Encounter: 07/04/2023  Primary Care Provider:  Lanae Pinal, MD Primary Cardiologist:  Lauro Portal, MD  Chief Complaint    83 year old male with a history of longstanding persistent atrial fibrillation, cardiomyopathy with improved EF, BPH, and GERD who presents for follow-up related to atrial fibrillation.  Past Medical History    Past Medical History:  Diagnosis Date   BPH (benign prostatic hyperplasia)    Chronic combined systolic and diastolic congestive heart failure (HCC)    GERD (gastroesophageal reflux disease)    protonix  for control   Insomnia    Neck pain    Neuropathy    Past Surgical History:  Procedure Laterality Date   ANTERIOR CERVICAL DECOMPRESSION/DISCECTOMY FUSION 4 LEVELS N/A 10/27/2012   Procedure: Cervical Three-Four Cervical Four-Five Cervical Five-Six Cervical Six-Seven  Anterior cervical decompression/diskectomy/fusion;  Surgeon: Manya Sells, MD;  Location: [facility]UR[facility]S;  Service: Neurosurgery;  Laterality: N/A;  Cervical Three-Four Cervical Four-Five Cervical Five-Six Cervical Six-Seven  Anterior cervical decompression/diskectomy/fusion   CARDIOVERSION N/A 09/20/2020   Procedure: CARDIOVERSION;  Surgeon: Wendie Hamburg, MD;  Location: [facility] ENDOSCOPY;  Service: Cardiovascular;  Laterality: N/A;   FEMUR IM NAIL Right 03/08/2020   Procedure: INTRAMEDULLARY (IM) RETROGRADE FEMORAL NAILING;  Surgeon: Laneta Pintos, MD;  Location: [facility] OR;  Service: Orthopedics;  Laterality: Right;   HARDWARE REMOVAL Right 03/31/2023   Procedure: HARDWARE REMOVAL;  Surgeon: Laneta Pintos, MD;  Location: [facility] OR;  Service: Orthopedics;  Laterality: Right;   INTRAMEDULLARY (IM) NAIL INTERTROCHANTERIC Right 03/31/2023   Procedure: INTRAMEDULLARY (IM) NAIL INTERTROCHANTERIC;  Surgeon: Laneta Pintos, MD;  Location: [facility] OR;  Service: Orthopedics;  Laterality: Right;   SHOULDER ARTHROSCOPY  2010   rt shoulder    SHOULDER ARTHROSCOPY  02/15/2011   Procedure: ARTHROSCOPY SHOULDER;  Surgeon: Valdene Garret;  Location: [facility];  Service: Orthopedics;  Laterality: Right;  Debridement   TONSILLECTOMY      Allergies  Allergies  Allergen Reactions   Desmopressin Acetate Other (See Comments)    Hyponatremia    Zoloft  [Sertraline ] Other (See Comments)    Felt bad - Patient denies     Labs/Other Studies Reviewed    The following studies were reviewed today:  Cardiac Studies & Procedures   ______________________________________________________________________________________________     ECHOCARDIOGRAM  ECHOCARDIOGRAM COMPLETE 11/13/2022  Narrative ECHOCARDIOGRAM REPORT    Patient Name:   Dustin [name] Date of Exam: 11/13/2022 Medical Rec #:  [id number]        Height:       74.0 in Accession #:    [id number]       Weight:       226.0 lb Date of Birth:  Jun 18, 1940        BSA:          2.289 m Patient Age:    82 years         BP:           124/79 mmHg Patient Gender: M                HR:           84 bpm. Exam Location:  Inpatient  Procedure: 2D Echo, Cardiac Doppler, Color Doppler and Intracardiac Opacification Agent  Indications:    Pericardial effusion I31.3  History:        Patient has prior history of Echocardiogram examinations, most recent 09/14/2020. CHF, COPD, Arrythmias:Atrial Fibrillation; Signs/Symptoms:Syncope.  Sonographer:    Terrilee Few RCS Referring Phys: [phone] DEBBY CROSLEY   Sonographer Comments: Image acquisition challenging due to patient body habitus. IMPRESSIONS   1. Left ventricular ejection fraction, by estimation, is 50 to 55%. The left ventricle has low normal function. The left ventricle has no regional wall motion abnormalities. Left ventricular diastolic parameters were normal. 2. Right ventricular systolic function is mildly reduced. The right ventricular size is normal. There is moderately elevated pulmonary artery  systolic pressure. The estimated right ventricular systolic pressure is 46.9 mmHg. 3. Left atrial size was mildly dilated. 4. Right atrial size was mild to moderately dilated. 5. The mitral valve is normal in structure. Trivial mitral valve regurgitation. No evidence of mitral stenosis. 6. The aortic valve is normal in structure. Aortic valve regurgitation is trivial. No aortic stenosis is present. 7. The aortic root measurement of 4cm is normal when indexed for BSA. 8. The inferior vena cava is dilated in size with >50% respiratory variability, suggesting right atrial pressure of 8 mmHg.  FINDINGS Left Ventricle: Left ventricular ejection fraction, by estimation, is 50 to 55%. The left ventricle has low normal function. The left ventricle has no regional wall motion abnormalities. Definity  contrast agent was given IV to delineate the left ventricular endocardial borders. The left ventricular internal cavity size was normal in size. There is no left ventricular hypertrophy. Left ventricular diastolic parameters were normal. Normal left ventricular filling pressure.  Right Ventricle: The right ventricular size is normal. No increase in right ventricular wall thickness. Right ventricular systolic function is mildly reduced. There is moderately elevated pulmonary artery systolic pressure. The tricuspid regurgitant velocity is 3.12 m/s, and with an assumed right atrial pressure of 8 mmHg, the estimated right ventricular systolic pressure is 46.9 mmHg.  Left Atrium: Left atrial size was mildly dilated.  Right Atrium: Right atrial size was mild to moderately dilated.  Pericardium: There is no evidence of pericardial effusion.  Mitral Valve: The mitral valve is normal in structure. Trivial mitral valve regurgitation. No evidence of mitral valve stenosis.  Tricuspid Valve: The tricuspid valve is normal in structure. Tricuspid valve regurgitation is mild . No evidence of tricuspid stenosis.  Aortic  Valve: The aortic valve is normal in structure. Aortic valve regurgitation is trivial. No aortic stenosis is present. Aortic valve peak gradient measures 3.3 mmHg.  Pulmonic Valve: The pulmonic valve was normal in structure. Pulmonic valve regurgitation is not visualized. No evidence of pulmonic stenosis.  Aorta: The aortic root measurement of 4cm is normal when indexed for BSA. The aortic root is normal in size and structure.  Venous: The inferior vena cava is dilated in size with greater than 50% respiratory variability, suggesting right atrial pressure of 8 mmHg.  IAS/Shunts: No atrial level shunt detected by color flow Doppler.   LEFT VENTRICLE PLAX 2D LVIDd:         4.90 cm   Diastology LVIDs:         3.90 cm   LV e' medial:    8.24 cm/s LV PW:         1.20 cm   LV E/e' medial:  10.3 LV IVS:        1.00 cm   LV e' lateral:   13.20 cm/s LVOT diam:     2.40 cm   LV E/e' lateral: 6.4 LV SV:         54 LV SV Index:   24 LVOT Area:     4.52 cm  RIGHT VENTRICLE             IVC RV S prime:     12.70 cm/s  IVC diam: 3.10 cm TAPSE (M-mode): 1.0 cm  LEFT ATRIUM             Index        RIGHT ATRIUM           Index LA diam:        4.50 cm 1.97 cm/m   RA Area:     26.10 cm LA Vol (A2C):   93.0 ml 40.63 ml/m  RA Volume:   83.00 ml  36.26 ml/m LA Vol (A4C):   68.0 ml 29.70 ml/m LA Biplane Vol: 80.1 ml 34.99 ml/m AORTIC VALVE AV Area (Vmax): 3.15 cm AV Vmax:        91.10 cm/s AV Peak Grad:   3.3 mmHg LVOT Vmax:      63.40 cm/s LVOT Vmean:     40.300 cm/s LVOT VTI:       0.120 m  AORTA Ao Root diam: 4.00 cm Ao Asc diam:  3.20 cm  MITRAL VALVE               TRICUSPID VALVE MV Area (PHT): 5.54 cm    TR Peak grad:   38.9 mmHg MV Decel Time: 137 msec    TR Vmax:        312.00 cm/s MV E velocity: 84.80 cm/s MV A velocity: 30.20 cm/s  SHUNTS MV E/A ratio:  2.81        Systemic VTI:  0.12 m Systemic Diam: 2.40 cm  Gaylyn Keas MD Electronically signed by Gaylyn Keas  MD Signature Date/Time: 11/13/2022/4:32:56 PM    Final          ______________________________________________________________________________________________     Recent Labs: 11/13/2022: B Natriuretic Peptide 444.6 04/03/2023: Magnesium  2.1 04/06/2023: ALT 99; BUN 19; Creatinine, Ser 1.39; Potassium 3.9; Sodium 133 04/08/2023: Hemoglobin 8.7; Platelets 210  Recent Lipid Panel    Component Value Date/Time   CHOL 143 01/23/2016 0414   TRIG 60 01/23/2016 0414   HDL 54 01/23/2016 0414   CHOLHDL 2.6 01/23/2016 0414   VLDL 12 01/23/2016 0414   LDLCALC 77 01/23/2016 0414    History of Present Illness    83 year old male with the above past medical history including longstanding persistent atrial fibrillation, cardiomyopathy with improved EF, BPH, and GERD.  Echocardiogram in 2017 showed EF 40 to 45%.  He was diagnosed with atrial fibrillation in 2022.  He underwent outpatient DCCV with early recurrence of atrial fibrillation.  Now rate controlled on Eliquis .  He was hospitalized in September 2024 in the setting of COVID pneumonia. Echocardiogram in 10/2022 showed EF improved to 50 to 55%, low normal LV function, no RWMA, mildly reduced RV systolic function, moderately dilated right atrium, no significant valvular abnormalities.  He was last seen in the office on 12/23/2022 and was stable from a cardiac standpoint.  He was hospitalized in February 2025 in the setting of fall, closed traumatic intertrochanteric and periprosthetic fracture of the right femur, atrial fibrillation with RVR.  He underwent ORIF.  Metoprolol  was increased to 37.5 mg twice daily.  He presents today for follow-up.  Since his last visit and since his hospitalization he has done well from a cardiac standpoint.  He denies any chest pain, palpitations, dizziness, dyspnea, edema, PND, orthopnea, weight gain.  He is currently ambulating with a walker, but hopes to transition to a cane next  week.  Overall, he reports feeling  well.    Home Medications    Current Outpatient Medications  Medication Sig Dispense Refill   apixaban  (ELIQUIS ) 5 MG TABS tablet Take 5 mg by mouth 2 (two) times daily.     DULoxetine  (CYMBALTA ) 60 MG capsule Take 60 mg by mouth daily.     FARXIGA  10 MG TABS tablet Take 10 mg by mouth daily.     ferrous sulfate  325 (65 FE) MG tablet Take 1 tablet (325 mg total) by mouth daily with breakfast. 30 tablet 0   gabapentin  (NEURONTIN ) 300 MG capsule Take 2 capsules (600 mg total) by mouth 3 (three) times daily. 180 capsule 0   Metoprolol  Tartrate 37.5 MG TABS Take 1 tablet (37.5 mg total) by mouth 2 (two) times daily.     Multiple Vitamin (MULTIVITAMIN WITH MINERALS) TABS tablet Take 1 tablet by mouth daily. 30 tablet    pantoprazole  (PROTONIX ) 40 MG tablet Take 1 tablet (40 mg total) by mouth daily. 30 tablet    QUEtiapine  (SEROQUEL ) 300 MG tablet Take 1 tablet (300 mg total) by mouth at bedtime. 30 tablet 0   silodosin (RAPAFLO) 8 MG CAPS capsule Take 8 mg by mouth daily.     acetaminophen  (TYLENOL ) 325 MG tablet Take 2 tablets (650 mg total) by mouth every 6 (six) hours as needed for mild pain (pain score 1-3), fever or headache (or Fever >/= 101). (Patient not taking: Reported on 07/04/2023)     benzonatate  (TESSALON ) 100 MG capsule Take 1 capsule (100 mg total) by mouth 3 (three) times daily as needed for cough. (Patient not taking: Reported on 07/04/2023)     DENTA 5000 PLUS 1.1 % CREA dental cream Place 1 Application onto teeth in the morning and at bedtime. (Patient not taking: Reported on 07/04/2023)     fluorouracil (EFUDEX) 5 % cream Apply 1 Application topically 2 (two) times daily. For left ear (Patient not taking: Reported on 07/04/2023)     ibuprofen (ADVIL) 200 MG tablet Take 400 mg by mouth as needed for moderate pain. (Patient not taking: Reported on 07/04/2023)     oxyCODONE  10 MG TABS Take 0.5-1 tablets (5-10 mg total) by mouth every 4 (four) hours as needed for severe pain (pain score  7-10). (Patient not taking: Reported on 07/04/2023) 42 tablet 0   polyethylene glycol (MIRALAX  / GLYCOLAX ) 17 g packet Take 17 g by mouth 2 (two) times daily. (Patient not taking: Reported on 07/04/2023)     rOPINIRole  (REQUIP ) 0.5 MG tablet Take 1 tablet (0.5 mg total) by mouth 3 (three) times daily as needed (Restless).     senna-docusate (SENOKOT-S) 8.6-50 MG tablet Take 2 tablets by mouth 2 (two) times daily. (Patient not taking: Reported on 07/04/2023)     Vitamin D , Ergocalciferol , (DRISDOL ) 1.25 MG (50000 UNIT) CAPS capsule Take 50,000 Units by mouth once a week. Thursdays (Patient not taking: Reported on 07/04/2023)     No current facility-administered medications for this visit.     Review of Systems    He denies chest pain, palpitations, dyspnea, pnd, orthopnea, n, v, dizziness, syncope, edema, weight gain, or early satiety. All other systems reviewed and are otherwise negative except as noted above.   Physical Exam    VS:  BP 122/74   Pulse 95   Ht 6\' 2"  (1.88 m)   Wt 215 lb 3.2 oz (97.6 kg)   SpO2 96%   BMI 27.63 kg/m  GEN: Well nourished, well developed,  in no acute distress. HEENT: normal. Neck: Supple, no JVD, carotid bruits, or masses. Cardiac: RRR, no murmurs, rubs, or gallops. No clubbing, cyanosis, edema.  Radials/DP/PT 2+ and equal bilaterally.  Respiratory:  Respirations regular and unlabored, clear to auscultation bilaterally. GI: Soft, nontender, nondistended, BS + x 4. MS: no deformity or atrophy. Skin: warm and dry, no rash. Neuro:  Strength and sensation are intact. Psych: Normal affect.  Accessory Clinical Findings    ECG personally reviewed by me today - EKG Interpretation Date/Time:  Friday Jul 04 2023 08:59:48 EDT Ventricular Rate:  95 PR Interval:    QRS Duration:  80 QT Interval:  320 QTC Calculation: 402 R Axis:   24  Text Interpretation: Atrial fibrillation with premature ventricular or aberrantly conducted complexes When compared with ECG of  02-Apr-2023 09:48, No significant change was found Confirmed by Marlana Silvan ([id number]) on 07/04/2023 9:12:16 AM  - no acute changes.   Lab Results  Component Value Date   WBC 8.0 04/08/2023   HGB 8.7 (L) 04/08/2023   HCT 27.0 (L) 04/08/2023   MCV 93.4 04/08/2023   PLT 210 04/08/2023   Lab Results  Component Value Date   CREATININE 1.39 (H) 04/06/2023   BUN 19 04/06/2023   NA 133 (L) 04/06/2023   K 3.9 04/06/2023   CL 100 04/06/2023   CO2 23 04/06/2023   Lab Results  Component Value Date   ALT 99 (H) 04/06/2023   AST 117 (H) 04/06/2023   ALKPHOS 99 04/06/2023   BILITOT 2.0 (H) 04/06/2023   Lab Results  Component Value Date   CHOL 143 01/23/2016   HDL 54 01/23/2016   LDLCALC 77 01/23/2016   TRIG 60 01/23/2016   CHOLHDL 2.6 01/23/2016    Lab Results  Component Value Date   HGBA1C 5.5 01/23/2016    Assessment & Plan   1. Longstanding persistent atrial fibrillation: Rate controlled.  Recent episode of A-fib with RVR in the setting of mechanical fall, fracture to right femur.  Metoprolol  was increased at the time, heart rate has been well-controlled since.  Denies recurrent falls, denies bleeding. Recent CBC, BMET were stable. Continue metoprolol , Eliquis .   2. Cardiomyopathy with improved EF: Prior EF 40 to 45%, improved to 50 to 55% on most recent echo in 10/2022. Euvolemic and well compensated on exam.  Continue metoprolol , Farxiga .  3. Disposition: Follow-up in 6 months with Dr. Katheryne Pane.      Jude Norton, NP 07/04/2023, 9:12 AM

## 2023-07-04 NOTE — Patient Instructions (Signed)
 Medication Instructions:  Your physician recommends that you continue on your current medications as directed. Please refer to the Current Medication list given to you today.  *If you need a refill on your cardiac medications before your next appointment, please call your pharmacy*  Lab Work: NONE ordered at this time of appointment   Testing/Procedures: NONE ordered at this time of appointment   Follow-Up: At [facility], you and your health needs are our priority.  As part of our continuing mission to provide you with exceptional heart care, our providers are all part of one team.  This team includes your primary Cardiologist (physician) and Advanced Practice Providers or APPs (Physician Assistants and Nurse Practitioners) who all work together to provide you with the care you need, when you need it.  Your next appointment:   6 month(s)  Provider:   Lauro Portal, MD    We recommend signing up for the patient portal called "MyChart".  Sign up information is provided on this After Visit Summary.  MyChart is used to connect with patients for Virtual Visits (Telemedicine).  Patients are able to view lab/test results, encounter notes, upcoming appointments, etc.  Non-urgent messages can be sent to your provider as well.   To learn more about what you can do with MyChart, go to [email or web address].

## 2023-07-06 ENCOUNTER — Encounter: Payer: Self-pay | Admitting: Nurse Practitioner

## 2023-10-02 ENCOUNTER — Telehealth: Payer: Self-pay

## 2023-10-02 DIAGNOSIS — F331 Major depressive disorder, recurrent, moderate: Secondary | ICD-10-CM

## 2024-02-05 ENCOUNTER — Ambulatory Visit (INDEPENDENT_AMBULATORY_CARE_PROVIDER_SITE_OTHER): Admitting: Otolaryngology

## 2024-02-05 ENCOUNTER — Other Ambulatory Visit (HOSPITAL_COMMUNITY): Admission: RE | Admit: 2024-02-05 | Discharge: 2024-02-05 | Disposition: A | Source: Ambulatory Visit

## 2024-02-05 ENCOUNTER — Encounter (INDEPENDENT_AMBULATORY_CARE_PROVIDER_SITE_OTHER): Payer: Self-pay

## 2024-02-05 VITALS — BP 140/79 | HR 83 | Ht 74.0 in | Wt 225.0 lb

## 2024-02-05 DIAGNOSIS — C44229 Squamous cell carcinoma of skin of left ear and external auricular canal: Secondary | ICD-10-CM

## 2024-02-05 DIAGNOSIS — H9390 Unspecified disorder of ear, unspecified ear: Secondary | ICD-10-CM

## 2024-02-05 MED ORDER — BACITRACIN 500 UNIT/GM EX OINT: 1.0000 | TOPICAL_OINTMENT | Freq: Two times a day (BID) | CUTANEOUS | 0 refills | Status: AC

## 2024-02-05 MED ORDER — CEPHALEXIN 500 MG PO CAPS: 500.0000 mg | ORAL_CAPSULE | Freq: Two times a day (BID) | ORAL | 0 refills | Status: AC

## 2024-02-05 NOTE — Progress Notes (Signed)
 Dear Dr. Regino, Here is my assessment for our mutual patient, Dustin [name]. Thank you for allowing me the opportunity to care for your patient. Please do not hesitate to contact me should you have any other questions. Sincerely, Dr. Hadassah Parody  Otolaryngology Clinic Note Referring provider: Dr. Regino HPI:   Initial visit (01/2024): Left ear lesion ongoing for about 3 months, he tried to stick a needle in it to see if it would drain, but did not help. Not getting bigger, stays there. If he manipulates, it does hurt. No pain without manipulation. No drainage. No fevers. Does have h/o hearing loss for which he wears hearing aids. No numbness or ear, no weakness of face Patient otherwise denies: - dysphagia, odynophagia, unintentional weight loss - ear pain, neck masses  Never had a biopsy of this.  No recent skin cancers -- has had some skin cancers but he does not remember exactly where on face  ENT Surgery: see above;  Personal or FHx of bleeding dz or anesthesia difficulty: no  AP/AC: Eliquis   Tobacco: long history, quit PMHx: A-fib, GERD, Eczema, RLS, GERD, Insomnia, MDD, CKD  Independent Review of Additional Tests or Records:  Referral note Dibas Koirala, MD 01/27/24: bump on left tragus, attempted to drain it, did not help; Dx: Ear lesion; Rx: ref to ENT Dr. Jesus 09/04/2021: noted left external ear lesion, no trauma. Noted 1 cm superficial ulceration left scaphoid; rec abx ointment; then reoslved.  CBC and CMP 03/2023: WBC wnl, Hbg 8.7,BBUN/Cr 19/1.39 PMH/Meds/All/SocHx/FamHx/ROS:   Past Medical History:  Diagnosis Date   BPH (benign prostatic hyperplasia)    Chronic combined systolic and diastolic congestive heart failure (HCC)    GERD (gastroesophageal reflux disease)    protonix  for control   Insomnia    Neck pain    Neuropathy      Past Surgical History:  Procedure Laterality Date   ANTERIOR CERVICAL DECOMPRESSION/DISCECTOMY FUSION 4 LEVELS N/A 10/27/2012    Procedure: Cervical Three-Four Cervical Four-Five Cervical Five-Six Cervical Six-Seven  Anterior cervical decompression/diskectomy/fusion;  Surgeon: Fairy Levels, MD;  Location: [facility];  Service: Neurosurgery;  Laterality: N/A;  Cervical Three-Four Cervical Four-Five Cervical Five-Six Cervical Six-Seven  Anterior cervical decompression/diskectomy/fusion   CARDIOVERSION N/A 09/20/2020   Procedure: CARDIOVERSION;  Surgeon: Kate Lonni CROME, MD;  Location: [facility] ENDOSCOPY;  Service: Cardiovascular;  Laterality: N/A;   FEMUR IM NAIL Right 03/08/2020   Procedure: INTRAMEDULLARY (IM) RETROGRADE FEMORAL NAILING;  Surgeon: Kendal Franky SQUIBB, MD;  Location: [facility] OR;  Service: Orthopedics;  Laterality: Right;   HARDWARE REMOVAL Right 03/31/2023   Procedure: HARDWARE REMOVAL;  Surgeon: Kendal Franky SQUIBB, MD;  Location: [facility] OR;  Service: Orthopedics;  Laterality: Right;   INTRAMEDULLARY (IM) NAIL INTERTROCHANTERIC Right 03/31/2023   Procedure: INTRAMEDULLARY (IM) NAIL INTERTROCHANTERIC;  Surgeon: Kendal Franky SQUIBB, MD;  Location: [facility] OR;  Service: Orthopedics;  Laterality: Right;   SHOULDER ARTHROSCOPY  2010   rt shoulder   SHOULDER ARTHROSCOPY  02/15/2011   Procedure: ARTHROSCOPY SHOULDER;  Surgeon: Lamar Prentice Collet;  Location: [facility];  Service: Orthopedics;  Laterality: Right;  Debridement   TONSILLECTOMY      Family History  Problem Relation Age of Onset   Emphysema Mother    Cirrhosis Father    Stroke Brother      Social Connections: Not on file     Current Outpatient Medications  Medication Instructions   acetaminophen  (TYLENOL ) 650 mg, Oral, Every 6 hours PRN   apixaban  (ELIQUIS ) 5 mg, 2  times daily   bacitracin  500 UNIT/GM ointment 1 Application, Topical, 2 times daily   benzonatate  (TESSALON ) 100 mg, Oral, 3 times daily PRN   cephALEXin  (KEFLEX ) 500 mg, Oral, 2 times daily   DENTA 5000 PLUS 1.1 % CREA dental cream 1 Application, 2 times daily   DULoxetine  (CYMBALTA )  60 mg, Daily   Farxiga  10 mg, Daily   ferrous sulfate  325 mg, Oral, Daily with breakfast   fluorouracil (EFUDEX) 5 % cream 1 Application, 2 times daily   gabapentin  (NEURONTIN ) 600 mg, Oral, 3 times daily   ibuprofen (ADVIL) 400 mg, As needed   Metoprolol  Tartrate 37.5 mg, Oral, 2 times daily   Multiple Vitamin (MULTIVITAMIN WITH MINERALS) TABS tablet 1 tablet, Oral, Daily   Oxycodone  HCl 5-10 mg, Oral, Every 4 hours PRN   pantoprazole  (PROTONIX ) 40 mg, Oral, Daily   polyethylene glycol (MIRALAX  / GLYCOLAX ) 17 g, Oral, 2 times daily   QUEtiapine  (SEROQUEL ) 300 mg, Oral, Daily at bedtime   rOPINIRole  (REQUIP ) 0.5 mg, Oral, 3 times daily PRN   senna-docusate (SENOKOT-S) 8.6-50 MG tablet 2 tablets, Oral, 2 times daily   silodosin (RAPAFLO) 8 mg, Daily   Vitamin D  (Ergocalciferol ) (DRISDOL ) 50,000 Units, Weekly     Physical Exam:   BP (!) 140/79 (BP Location: Left Arm, Patient Position: Sitting)   Pulse 83   Ht 6' 2 (1.88 m)   Wt 225 lb (102.1 kg)   SpO2 (!) 89%   BMI 28.89 kg/m   Salient findings:  CN II-XII intact Bilateral EAC clear and TM intact with well pneumatized middle ear spaces; HA in place Noted left helical rim 1x1 cm firm lesion, no overlying skin change, mild tenderness, no other skin lesions appreciated Weber 512: mid Rinne 512: AC > BC b/l  Parotids soft, no lesions; no postauricular lymphadenopathy Anterior rhinoscopy: Septum intact; bilateral inferior turbinates without significant hypertrophy No lesions of oral cavity/oropharynx No obviously palpable neck masses/lymphadenopathy/thyromegaly No respiratory distress or stridor  Seprately Identifiable Procedures:  Prior to initiating any procedures, risks/benefits/alternatives were explained to the patient and verbal or written consent obtained. Procedure Note - Punch Biopsy  Name: Dustin [name] MRN: [id number] Date: 02/05/2024   Pre-procedure diagnosis: Left ear helix Lesion, concern for  malignancy Post-procedure diagnosis: Same Procedure: Biopsy of left ear helix lesion (CPT 69100-LT) Complications: None apparent EBL: <5 mL Date: 02/05/2024  Indication: Left ear Lesion, concern for malignancy  Risks and benefits of biopsy were explained to the patient, written consent was obtained.   The patient was identified as the correct patient.  The area surrounding the lesion was injected 1% Lidocaine  with 1:100,000 epinephrine  and allowed to work.. A 4mm punch biopsy was used to make a circular incision in the periphery of the lesion. The tissue was carefully picked up with a forcep and cut free with sharp scissors. It was placed in formalin and passed off the field. A 5-0 fast gut suture was used to close the punch site. There was minimal bleeding that fully resolved prior to the end of the procedure with the following interventions: silver nitrate cautery. The patient tolerated the procedure well.    Electronically signed by: Eldora KATHEE Blanch, MD 02/05/2024 9:59 AM    Impression & Plans:  Dustin [name] is a 83 y.o. male with:  1. Ear lesion    Left ear lesion ongoing for 3 months; given hx and persistence, discussed R/B/A and bx performed Will do keflex  x7d, bacitracin  x7d F/u 10 days to discuss  results; if cancer, may need wedge excision and closure  See below regarding exact medications prescribed this encounter including dosages and route: Meds ordered this encounter  Medications   cephALEXin  (KEFLEX ) 500 MG capsule    Sig: Take 1 capsule (500 mg total) by mouth 2 (two) times daily for 7 days.    Dispense:  14 capsule    Refill:  0   bacitracin  500 UNIT/GM ointment    Sig: Apply 1 Application topically 2 (two) times daily for 7 days.    Dispense:  15 g    Refill:  0      Thank you for allowing me the opportunity to care for your patient. Please do not hesitate to contact me should you have any other questions.  Sincerely, Eldora Blanch MD Otolaryngologist (ENT),  [facility] Phone: [phone] Fax: [phone]  MDM:  Level 4 - [phone] Complexity/Problems addressed: mod Data complexity: mod - independent review of notes, labs - Morbidity: mod  - Prescription Drug prescribed or managed: y

## 2024-02-05 NOTE — Patient Instructions (Signed)
 Take keflex  500mg  tablet twice per day for 7 days Apply bacitracin  antibiotic ointment to the biopsy area twice daily for 7 days

## 2024-02-05 NOTE — Addendum Note (Signed)
 Addended by: Davan Hark on: 02/05/2024 10:21 AM   Modules accepted: Orders

## 2024-02-06 LAB — SURGICAL PATHOLOGY

## 2024-02-17 ENCOUNTER — Encounter (INDEPENDENT_AMBULATORY_CARE_PROVIDER_SITE_OTHER): Payer: Self-pay | Admitting: Otolaryngology

## 2024-02-17 ENCOUNTER — Ambulatory Visit (INDEPENDENT_AMBULATORY_CARE_PROVIDER_SITE_OTHER): Admitting: Otolaryngology

## 2024-02-17 VITALS — BP 106/71 | HR 102 | Ht 74.0 in | Wt 236.0 lb

## 2024-02-17 DIAGNOSIS — C44229 Squamous cell carcinoma of skin of left ear and external auricular canal: Secondary | ICD-10-CM

## 2024-02-17 NOTE — Progress Notes (Signed)
 Dear Dr. Regino, Here is my assessment for our mutual patient, Dustin [name]. Thank you for allowing me the opportunity to care for your patient. Please do not hesitate to contact me should you have any other questions. Sincerely, Dr. Hadassah Parody  Otolaryngology Clinic Note Referring provider: Dr. Regino HPI:   Initial visit (01/2024): Left ear lesion ongoing for about 3 months, he tried to stick a needle in it to see if it would drain, but did not help. Not getting bigger, stays there. If he manipulates, it does hurt. No pain without manipulation. No drainage. No fevers. Does have h/o hearing loss for which he wears hearing aids. No numbness or ear, no weakness of face Patient otherwise denies: - dysphagia, odynophagia, unintentional weight loss - ear pain, neck masses  Never had a biopsy of this.  No recent skin cancers -- has had some skin cancers but he does not remember exactly where on face  --------------------------------------------------------- 02/17/2024 We discussed his pathology results. He reports that he has some discomfort over the ear but has not gotten bigger. No drainage or fevers. No bleeding.  ENT Surgery: see above;  Personal or FHx of bleeding dz or anesthesia difficulty: no  AP/AC: Eliquis   Tobacco: long history, quit PMHx: A-fib, GERD, Eczema, RLS, GERD, Insomnia, MDD, CKD  Independent Review of Additional Tests or Records:  Referral note Dustin Koirala, MD 01/27/24: bump on left tragus, attempted to drain it, did not help; Dx: Ear lesion; Rx: ref to ENT Dr. Jesus 09/04/2021: noted left external ear lesion, no trauma. Noted 1 cm superficial ulceration left scaphoid; rec abx ointment; then reoslved.  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  PMH/Meds/All/SocHx/FamHx/ROS:   Past Medical History:  Diagnosis Date   BPH (benign prostatic hyperplasia)    Chronic combined systolic  and diastolic congestive heart failure (HCC)    GERD (gastroesophageal reflux disease)    protonix  for control   Insomnia    Neck pain    Neuropathy      Past Surgical History:  Procedure Laterality Date   ANTERIOR CERVICAL DECOMPRESSION/DISCECTOMY FUSION 4 [name] N/A 10/27/2012   Procedure: Cervical Three-Four Cervical Four-Five Cervical Five-Six Cervical Six-Seven  Anterior cervical decompression/diskectomy/fusion;  Surgeon: Dustin Levels, MD;  Location: [facility];  Service: Neurosurgery;  Laterality: N/A;  Cervical Three-Four Cervical Four-Five Cervical Five-Six Cervical Six-Seven  Anterior cervical decompression/diskectomy/fusion   CARDIOVERSION N/A 09/20/2020   Procedure: CARDIOVERSION;  Surgeon: Dustin Lonni CROME, MD;  Location: [facility] ENDOSCOPY;  Service: Cardiovascular;  Laterality: N/A;   FEMUR IM NAIL Right 03/08/2020   Procedure: INTRAMEDULLARY (IM) RETROGRADE FEMORAL NAILING;  Surgeon: Dustin Franky SQUIBB, MD;  Location: [facility] OR;  Service: Orthopedics;  Laterality: Right;   HARDWARE REMOVAL Right 03/31/2023   Procedure: HARDWARE REMOVAL;  Surgeon: Dustin Franky SQUIBB, MD;  Location: [facility] OR;  Service: Orthopedics;  Laterality: Right;   INTRAMEDULLARY (IM) NAIL INTERTROCHANTERIC Right 03/31/2023   Procedure: INTRAMEDULLARY (IM) NAIL INTERTROCHANTERIC;  Surgeon: Dustin Franky SQUIBB, MD;  Location: [facility];  Service: Orthopedics;  Laterality: Right;   SHOULDER ARTHROSCOPY  2010   rt shoulder   SHOULDER ARTHROSCOPY  02/15/2011   Procedure: ARTHROSCOPY SHOULDER;  Surgeon: Dustin [name];  Location: [facility];  Service: Orthopedics;  Laterality: Right;  Debridement   TONSILLECTOMY      Family History  Problem Relation Age of Onset   Emphysema Mother    Cirrhosis Father    Stroke Brother  Social Connections: Not on file     Current Outpatient Medications  Medication Instructions   acetaminophen  (TYLENOL ) 650 mg, Oral, Every 6 hours PRN   apixaban  (ELIQUIS ) 5 mg, 2  times daily   benzonatate  (TESSALON ) 100 mg, Oral, 3 times daily PRN   DENTA 5000 PLUS 1.1 % CREA dental cream 1 Application, 2 times daily   DULoxetine  (CYMBALTA ) 60 mg, Daily   Farxiga  10 mg, Daily   ferrous sulfate  325 mg, Oral, Daily with breakfast   fluorouracil (EFUDEX) 5 % cream 1 Application, 2 times daily   furosemide  (LASIX ) 40 mg, Daily   gabapentin  (NEURONTIN ) 600 mg, Oral, 3 times daily   ibuprofen (ADVIL) 400 mg, As needed   Metoprolol  Tartrate 37.5 mg, Oral, 2 times daily   Multiple Vitamin (MULTIVITAMIN WITH MINERALS) TABS tablet 1 tablet, Oral, Daily   Oxycodone  HCl 5-10 mg, Oral, Every 4 hours PRN   pantoprazole  (PROTONIX ) 40 mg, Oral, Daily   polyethylene glycol (MIRALAX  / GLYCOLAX ) 17 g, Oral, 2 times daily   QUEtiapine  (SEROQUEL ) 300 mg, Oral, Daily at bedtime   rOPINIRole  (REQUIP ) 0.5 mg, Oral, 3 times daily PRN   senna-docusate (SENOKOT-S) 8.6-50 MG tablet 2 tablets, Oral, 2 times daily   silodosin (RAPAFLO) 8 mg, Daily   Vitamin D  (Ergocalciferol ) (DRISDOL ) 50,000 Units, Weekly     Physical Exam:   BP 106/71 (BP Location: Right Arm, Patient Position: Sitting, Cuff Size: Large)   Pulse (!) 102   Ht 6' 2 (1.88 m)   Wt 236 lb (107 kg)   SpO2 (!) 89%   BMI 30.30 kg/m   Salient findings:  CN II-XII intact Bilateral EAC clear and TM intact with well pneumatized middle ear spaces; HA in place Noted left helical rim 1x1 cm firm lesion, no overlying skin change, mild tenderness, no other skin lesions appreciated; stable Weber 512: mid Rinne 512: AC > BC b/l  Parotids soft, no lesions; no postauricular lymphadenopathy No obviously palpable neck masses/lymphadenopathy/thyromegaly No respiratory distress or stridor  Seprately Identifiable Procedures:  Prior to initiating any procedures, risks/benefits/alternatives were explained to the patient and verbal or written consent obtained. None today   Electronically signed by: Dustin KATHEE Blanch, MD 02/17/2024 10:04 AM     Impression & Plans:  Dustin [name] is a 83 y.o. male with:  1. Squamous cell carcinoma of ear, left    Well differentiated squamous cell carcinoma. Will need excision; can do under local Will get approval from Damien Laurence (Dr. Court) --- hold Eliquis  for 3 days before surgery, and restart 3 days after Will get CT Neck  See below regarding exact medications prescribed this encounter including dosages and route: No orders of the defined types were placed in this encounter.     Thank you for allowing me the opportunity to care for your patient. Please do not hesitate to contact me should you have any other questions.  Sincerely, Dustin Blanch MD Otolaryngologist (ENT), [facility] Phone: [phone] Fax: [phone]  MDM:  Level 4 - [phone] Complexity/Problems addressed: mod Data complexity: low - Morbidity: mod  - decision for surgery - Prescription Drug prescribed or managed: n

## 2024-02-17 NOTE — Patient Instructions (Signed)
 I have ordered an imaging study for you to complete prior to your next visit. Please call [facility] Scheduling at [phone] to schedule your imaging if you have not received a call within 24 hours. If you are unable to complete your imaging study prior to your next scheduled visit please call our office to let us  know.

## 2024-02-20 ENCOUNTER — Telehealth (HOSPITAL_BASED_OUTPATIENT_CLINIC_OR_DEPARTMENT_OTHER): Payer: Self-pay

## 2024-02-20 NOTE — Telephone Encounter (Signed)
"  ° °  Pre-operative Risk Assessment    Patient Name: Dustin [name]  DOB: 1940/12/05 MRN: [id number]   Date of last office visit: 07/04/2023 with Damien Braver, NP Date of next office visit: None  Request for Surgical Clearance    Procedure:  Auricular lesion excision, complex lesion closure   Date of Surgery:  Clearance TBD                                 Surgeon:  Dr. Tobie Socks Group or Practice Name:  [facility]  Phone number:  [phone] Fax number:  [phone]   Type of Clearance Requested:   - Medical  - Pharmacy:  Hold Apixaban  (Eliquis ) -for 3 days prior and 3 days after surgery   Type of Anesthesia:  General    Additional requests/questions:  Length of surgery 90 minutes  Signed, Patrcia Iverson CROME   02/20/2024, 2:37 PM   "

## 2024-02-20 NOTE — Telephone Encounter (Signed)
 Pharmacy please advise on holding eliquis  prior to  Auricular lesion excision, complex lesion closure  scheduled for TBD. Thank you.   Last labs: 03/2023

## 2024-02-20 NOTE — Telephone Encounter (Signed)
 I was speaking with the pt today who needs an appt for preop clearance. Pt  tells me that surgery scheduler called him today and scheduled for 02/27/24. Our office also received this request as TBD. Our schedules are full and I am reviewing to see where we can get the pt in.   I left a message for the surgery scheduler as FYI as well.   I d/w Reche Finder, NP who graciously agreed to see the pt for preop clearance. Pt agreed to the appt as well 02/24/24 2:45 DWB with Reche Finder, NP   I will update all parties involved of appt.

## 2024-02-20 NOTE — Telephone Encounter (Signed)
" ° °  Name: Dustin [name]  DOB: 1940/08/17  MRN: [id number]  Primary Cardiologist: Dorn Lesches, MD  Chart reviewed as part of pre-operative protocol coverage. Because of [name]'s past medical history and time since last visit, he will require a follow-up in-office visit in order to better assess preoperative cardiovascular risk.  Pre-op covering staff: - Please schedule appointment and call patient to inform them. If patient already had an upcoming appointment within acceptable timeframe, please add pre-op clearance to the appointment notes so provider is aware. - Please contact requesting surgeon's office via preferred method (i.e, phone, fax) to inform them of need for appointment prior to surgery.  This message will also be routed to pharmacy pool  for input on holding eliquis  as requested below so that this information is available to the clearing provider at time of patient's appointment.   [name], NP  02/20/2024, 4:37 PM   "

## 2024-02-23 ENCOUNTER — Telehealth (INDEPENDENT_AMBULATORY_CARE_PROVIDER_SITE_OTHER): Payer: Self-pay

## 2024-02-23 NOTE — Telephone Encounter (Signed)
 Surgical clearance faxed. Success confirmation received.

## 2024-02-23 NOTE — Telephone Encounter (Signed)
-----   Message from Eldora Blanch, MD sent at 02/17/2024 10:10 AM EST ----- Regarding: surgery clearance Hi Nef, Can we message Dr. Court in cardiology -- needs to hold eliquis  for 3 days before surgery, 3 days after surgery  Thanks KP

## 2024-02-24 ENCOUNTER — Other Ambulatory Visit: Payer: Self-pay

## 2024-02-24 ENCOUNTER — Encounter (HOSPITAL_COMMUNITY): Payer: Self-pay | Admitting: Urology

## 2024-02-24 ENCOUNTER — Ambulatory Visit (HOSPITAL_BASED_OUTPATIENT_CLINIC_OR_DEPARTMENT_OTHER): Admitting: Family

## 2024-02-24 VITALS — BP 110/82 | HR 89 | Ht 74.0 in | Wt 242.0 lb

## 2024-02-24 DIAGNOSIS — I4819 Other persistent atrial fibrillation: Secondary | ICD-10-CM | POA: Diagnosis not present

## 2024-02-24 DIAGNOSIS — Z01810 Encounter for preprocedural cardiovascular examination: Secondary | ICD-10-CM | POA: Diagnosis not present

## 2024-02-24 DIAGNOSIS — E7800 Pure hypercholesterolemia, unspecified: Secondary | ICD-10-CM | POA: Insufficient documentation

## 2024-02-24 DIAGNOSIS — N1831 Chronic kidney disease, stage 3a: Secondary | ICD-10-CM | POA: Insufficient documentation

## 2024-02-24 DIAGNOSIS — I519 Heart disease, unspecified: Secondary | ICD-10-CM

## 2024-02-24 DIAGNOSIS — G969 Disorder of central nervous system, unspecified: Secondary | ICD-10-CM | POA: Insufficient documentation

## 2024-02-24 DIAGNOSIS — I272 Pulmonary hypertension, unspecified: Secondary | ICD-10-CM | POA: Insufficient documentation

## 2024-02-24 DIAGNOSIS — I509 Heart failure, unspecified: Secondary | ICD-10-CM | POA: Insufficient documentation

## 2024-02-24 DIAGNOSIS — I700 Atherosclerosis of aorta: Secondary | ICD-10-CM | POA: Insufficient documentation

## 2024-02-24 DIAGNOSIS — D6859 Other primary thrombophilia: Secondary | ICD-10-CM | POA: Diagnosis not present

## 2024-02-24 DIAGNOSIS — I5020 Unspecified systolic (congestive) heart failure: Secondary | ICD-10-CM

## 2024-02-24 NOTE — Progress Notes (Signed)
 SDW CALL  Patient was given pre-op instructions over the phone. The opportunity was given for the patient to ask questions. No further questions asked. Patient verbalized understanding of instructions given.   PCP - Regino Slater, MD  Cardiologist - Court Dorn PARAS, MD   PPM/ICD - Denies  Chest x-ray -  EKG - 07/04/23 Stress Test -  ECHO - 11/13/22 Cardiac Cath - denies  Sleep Study - denies   Fasting Blood Sugar - N/A  Blood Thinner Instructions: Eliquis - hold 3 days prior and 3 days after per surgeon- Last dose 02/23/24 PM per pt reports Aspirin  Instructions: N/A  ERAS Protcol - ERAS per protocol  COVID TEST- N/A   Anesthesia review: yes- history of CHF- going to cardiac clearance appointment at 2:45PM today. Pt denies any cardiac symptoms.   Patient denies shortness of breath, fever, cough and chest pain over the phone call    Surgical Instructions    Your procedure is scheduled on February 27, 2024  Report to [facility] Main Entrance A at 11:30 A.M., then check in with the Admitting office.  Call this number if you have problems the morning of surgery:  [phone]    Remember:  Do not eat after midnight the night before your surgery  You may drink clear liquids until 1100 the morning of your surgery.   Clear liquids allowed are: Water, Non-Citrus Juices (without pulp), Carbonated Beverages, Clear Tea, Black Coffee ONLY (no honey, NO MILK, CREAM OR POWDERED CREAMER of any kind), and Gatorade   Take these medicines the morning of surgery with A SIP OF WATER:  Silodosin, metoprolol , pantoprazole , gabapentin , duloxetine , Farxiga  (if for HF) PRN: tylenol    Follow your surgeon's instructions on stopping Eliquis , if no instructions were given, please contact your surgeon's office.    As of today, STOP taking any Aspirin  (unless otherwise instructed by your surgeon) Aleve, Naproxen, Ibuprofen, Motrin, Advil, Goody's, BC's, all herbal medications, fish oil, and  all vitamins.  [facility] is not responsible for any belongings or valuables. .     Contacts, glasses, hearing aids, dentures or partials may not be worn into surgery, please bring cases for these belongings   Patients discharged the day of surgery will not be allowed to drive home, and someone needs to stay with them for 24 hours.   SURGICAL WAITING ROOM VISITATION You may have 2 visitor in the pre-op area at a time determined by the pre-op nurse. (Visitor may not switch out) Patients having surgery or a procedure in a hospital may have two support people in the waiting room. Children under the age of 90 must have an adult with them who is not the patient.    Day of Surgery:  Take a shower the day of or night before with antibacterial soap. Wear Clean/Comfortable clothing the morning of surgery Do not apply any deodorants/lotions.   Do not wear jewelry Do not wear lotions, powders, perfumes/colognes, or deodorant. Do not shave 48 hours prior to surgery.  Men may shave face and neck. Do not bring valuables to the hospital.   Remember to brush your teeth WITH YOUR REGULAR TOOTHPASTE.

## 2024-02-24 NOTE — Progress Notes (Unsigned)
" °  Cardiology Office Note   Date:  02/24/2024  ID:  Dustin [name], DOB 04/14/1940, MRN [id number] PCP: Regino Slater, MD  [facility] Providers Cardiologist:  Dorn Lesches, MD { Click to update primary MD,subspecialty MD or APP then REFRESH:1}    History of Present Illness Dustin [name] is a 84 y.o. male ***  Presents today for preoperative clearance for excision of auricular lesion by Dr. Tobie.  Reports doing well since last seen. Reports no shortness of breath nor dyspnea on exertion. Reports no chest pain, pressure, or tightness. No  orthopnea, PND. Reports no palpitations.    Compression stockings - r leg Lost wife 2-3 years ago Son going through [employer] moved back in Granddaughter who he and wis wife raised  Workshop woodworking  ROS: ***  Studies Reviewed      *** Risk Assessment/Calculations {Does this patient have ATRIAL FIBRILLATION?[phone]}         Physical Exam VS:  BP 110/82 (BP Location: Left Arm, Patient Position: Sitting, Cuff Size: Normal)   Pulse 89   Ht 6' 2 (1.88 m)   Wt 242 lb (109.8 kg)   SpO2 94%   BMI 31.07 kg/m        Wt Readings from Last 3 Encounters:  02/24/24 242 lb (109.8 kg)  02/17/24 236 lb (107 kg)  02/05/24 225 lb (102.1 kg)    GEN: Well nourished, well developed in no acute distress NECK: No JVD; No carotid bruits CARDIAC: ***RRR, no murmurs, rubs, gallops RESPIRATORY:  Clear to auscultation without rales, wheezing or rhonchi  ABDOMEN: Soft, non-tender, non-distended EXTREMITIES:  No edema; No deformity   ASSESSMENT AND PLAN ***    {Are you ordering a CV Procedure (e.g. stress test, cath, DCCV, TEE, etc)?   Press F2        :[id number]}  Dispo: ***  Signed, Reche GORMAN Finder, NP   "

## 2024-02-24 NOTE — Telephone Encounter (Signed)
"  ° °  Patient Name: Dustin [name]  DOB: 02/08/1941 MRN: [id number]  Primary Cardiologist: Dorn Lesches, MD  Chart reviewed as part of pre-operative protocol coverage. Given past medical history and time since last visit, based on ACC/AHA guidelines, Dustin [name] is at acceptable risk for the planned procedure without further cardiovascular testing.   Seen in clinic 02/24/24 with no anginal symptoms, exercise tolerance >4 METS.   The patient was advised that if he develops new symptoms prior to surgery to contact our office to arrange for a follow-up visit, and he verbalized understanding.  Per pharmacy team recommendations, hold Eliquis  2 days prior to procedure - Dr. Tobie was contacted and agreeable with this recommendations. Resume post procedure at Dr. Anthony discretion.   I will route this recommendation to the requesting party via Epic fax function and remove from pre-op pool.  Please call with questions.  Dustin GORMAN Finder, NP 02/24/2024, 3:27 PM  "

## 2024-02-24 NOTE — Telephone Encounter (Signed)
 Patient with diagnosis of A Fib on Eliquis  for anticoagulation.    Procedure:  Auricular lesion excision, complex lesion closure  Date of procedure: TBD   CHA2DS2-VASc Score = 4  This indicates a 4.8% annual risk of stroke. The patient's score is based upon: CHF History: 1 HTN History: 1 Diabetes History: 0 Stroke History: 0 Vascular Disease History: 0 Age Score: 2 Gender Score: 0    CrCl 56 m/min Platelet count 210k  Patient  has not had an Afib/aflutter ablation in the last 3 months, DCCV within the last 4 weeks or a watchman implanted in the last 45 days    Per office protocol, patient can hold Eliquis  for 2 days prior to procedure.  Request is for a 6 day Eliquis  hold. If surgeon requires 6 day hold, will need approval from Dr Court   **This guidance is not considered finalized until pre-operative APP has relayed final recommendations.**

## 2024-02-24 NOTE — Patient Instructions (Addendum)
 Medication Instructions:   PLEASE TAKE YOUR NORMAL DOSE OF ELIQUIS  THIS EVENING (This was discussed with Dr. Tobie).  DO NOT TAKE ELIQUIS  ON WED (1/7), THURS (1/8), OR FRIDAY MORNING (1/9).  YOU WILL RESUME ELIQUIS  AFTER YOUR SURGERY AT THE DIRECTION OF DR. PATEL  Do NOT take Eliquis  Wednesday, Thursday, or Friday morning.   You may resume Eliquis  after your surgery at the direction of Dr. Tobie.  *If you need a refill on your cardiac medications before your next appointment, please call your pharmacy*  Follow-Up:  Your next appointment:   6 month(s)  Provider:   Dorn Lesches, MD or Damien Braver, NP  or Reche GORMAN Finder, NP     Other Instructions  To prevent or reduce lower extremity swelling: Eat a low salt diet. Salt makes the body hold onto extra fluid which causes swelling. Sit with legs elevated. For example, in the recliner or on an ottoman.  Wear knee-high compression stockings during the daytime. Ones labeled 15-20 mmHg provide good compression.

## 2024-02-25 ENCOUNTER — Encounter (HOSPITAL_COMMUNITY): Payer: Self-pay | Admitting: Vascular Surgery

## 2024-02-25 ENCOUNTER — Encounter (HOSPITAL_BASED_OUTPATIENT_CLINIC_OR_DEPARTMENT_OTHER): Payer: Self-pay | Admitting: Family

## 2024-02-25 NOTE — Anesthesia Preprocedure Evaluation (Addendum)
 "                                  Anesthesia Evaluation  Patient identified by MRN, date of birth, ID band Patient awake    Reviewed: Allergy & Precautions, NPO status , Patient's Chart, lab work & pertinent test results  Airway Mallampati: II  TM Distance: >3 FB Neck ROM: Full    Dental no notable dental hx. (+) Teeth Intact, Poor Dentition, Dental Advisory Given   Pulmonary pneumonia, COPD, former smoker   Pulmonary exam normal breath sounds clear to auscultation       Cardiovascular +CHF  Normal cardiovascular exam+ dysrhythmias  Rhythm:Regular Rate:Normal  ECHO: 24 1. Left ventricular ejection fraction, by estimation, is 50 to 55%. The  left ventricle has low normal function. The left ventricle has no regional  wall motion abnormalities. Left ventricular diastolic parameters were  normal.   2. Right ventricular systolic function is mildly reduced. The right  ventricular size is normal. There is moderately elevated pulmonary artery  systolic pressure. The estimated right ventricular systolic pressure is  46.9 mmHg.   3. Left atrial size was mildly dilated.   4. Right atrial size was mild to moderately dilated.   5. The mitral valve is normal in structure. Trivial mitral valve  regurgitation. No evidence of mitral stenosis.   6. The aortic valve is normal in structure. Aortic valve regurgitation is  trivial. No aortic stenosis is present.   7. The aortic root measurement of 4cm is normal when indexed for BSA.   8. The inferior vena cava is dilated in size with >50% respiratory  variability, suggesting right atrial pressure of 8 mmHg.     Neuro/Psych  Headaches PSYCHIATRIC DISORDERS  Depression     Neuromuscular disease    GI/Hepatic Neg liver ROS,GERD  Medicated and Controlled,,  Endo/Other  negative endocrine ROS    Renal/GU Renal diseasenegative Renal ROS     Musculoskeletal negative musculoskeletal ROS (+)    Abdominal   Peds   Hematology  (+) Blood dyscrasia, anemia Thrombocytopenia, 93K INR: 1.3   Anesthesia Other Findings Right intertrochanteric femur fracture  Reproductive/Obstetrics                              Anesthesia Physical Anesthesia Plan  ASA: 3  Anesthesia Plan: MAC   Post-op Pain Management: Tylenol  PO (pre-op)* and Celebrex PO (pre-op)*   Induction: Intravenous  PONV Risk Score and Plan: 2 and Ondansetron , Dexamethasone  and Treatment may vary due to age or medical condition  Airway Management Planned: Natural Airway, Simple Face Mask and Mask  Additional Equipment:   Intra-op Plan:   Post-operative Plan: Extubation in OR  Informed Consent: I have reviewed the patients History and Physical, chart, labs and discussed the procedure including the risks, benefits and alternatives for the proposed anesthesia with the patient or authorized representative who has indicated his/her understanding and acceptance.   Patient has DNR.  Discussed DNR with patient and Suspend DNR.   Dental advisory given  Plan Discussed with: CRNA and Anesthesiologist  Anesthesia Plan Comments: (PAT note written 02/25/2024 by Isaiah Ruder, PA-C.   DISCUSSION: Patient is an 84 year old male scheduled for the above procedure. 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 CT soft tissue neck has been ordered.    Other history includes former smoker (quit 01/27/1990),  chronic combined systolic and diastolic CHF (EF 79-74% 01/2016, 50-55% 10/2022), atrial fibrillation (s/p DCCV 09/20/2020, recurrent afib), GERD, neuropathy, BPH, spinal surgery (C3-7 ACDF 10/27/2012), right periprosthetic femur fracture (s/p cephalomedullary nailing, removal of right femur hardware 03/30/2023), CKD (stage 3).    Preoperative cardiology input outlined by Vannie Mora, NP on 02/24/2024: Given past medical history and time since last visit, based on ACC/AHA guidelines, EGIDIO LOFGREN  is at acceptable risk for the planned procedure without further cardiovascular testing.    Seen in clinic 02/24/24 with no anginal symptoms, exercise tolerance >4 METS.    The patient was advised that if he develops new symptoms prior to surgery to contact our office to arrange for a follow-up visit, and he verbalized understanding.   Per pharmacy team recommendations, hold Eliquis  2 days prior to procedure - Dr. Tobie was contacted and agreeable with this recommendations. Resume post procedure at Dr. Anthony discretion. Last dose Eliquis  02/23/2024.   Anesthesia team to evaluate on the day of surgery.    )        Anesthesia Quick Evaluation  "

## 2024-02-25 NOTE — Progress Notes (Signed)
 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 CT soft tissue neck has been ordered.   Other history includes former smoker (quit 01/27/1990), chronic combined systolic and diastolic CHF (EF 79-74% 01/2016, 50-55% 10/2022), atrial fibrillation (s/p DCCV 09/20/2020, recurrent afib), GERD, neuropathy, BPH, spinal surgery (C3-7 ACDF 10/27/2012), right periprosthetic femur fracture (s/p cephalomedullary nailing, removal of right femur hardware 03/30/2023), CKD (stage 3).   Preoperative cardiology input outlined by Vannie Mora, NP on 02/24/2024: Given past medical history and time since last visit, based on ACC/AHA guidelines, Dustin [name] is at acceptable risk for the planned procedure without further cardiovascular testing.    Seen in clinic 02/24/24 with no anginal symptoms, exercise tolerance >4 METS.    The patient was advised that if he develops new symptoms prior to surgery to contact our office to arrange for a follow-up visit, and he verbalized understanding.   Per pharmacy team recommendations, hold Eliquis  2 days prior to procedure - Dr. Tobie was contacted and agreeable with this recommendations. Resume post procedure at Dr. Anthony discretion. Last dose Eliquis  02/23/2024.  Anesthesia team to evaluate on the day of surgery.    VS:  Wt Readings from Last 3 Encounters:  02/24/24 109.8 kg  02/17/24 107 kg  02/05/24  102.1 kg   BP Readings from Last 3 Encounters:  02/24/24 110/82  02/17/24 106/71  02/05/24 (!) 140/79   Pulse Readings from Last 3 Encounters:  02/24/24 89  02/17/24 (!) 102  02/05/24 83     PROVIDERS: Regino Slater, MD is PCP  Court Carrier, MD is cardiologist   LABS: Update as indicated on the day of surgery.  Most recent results noted from [facility] Everywhere include: 01/28/2024: WBC 6.9, hemoglobin 12.5, hematocrit 36.2, platelet count 125, glucose 116, BUN 22, creatinine 1.67, eGFR 41, sodium 142, potassium 3.5, calcium 8.8 (corrected 8.55), albumin  4.3, total bilirubin 1.6, alkaline phosphatase 231, AST 28, ALT 15, BNP 160.9. A1c 5.5%, TSH 5.22 on 10/22/2023.     EKG: EKG 02/24/2024: Afib at 89 bpm  EKG 07/04/2023: Atrial fibrillation with premature ventricular or aberrantly conducted complexes When compared with ECG of 02-Apr-2023 09:48, No significant change was found Confirmed by Daneen Perkins ([id number]) on 07/04/2023 9:12:16 AM  - no acute changes.    CV: Echo 11/13/2022: IMPRESSIONS   1. Left ventricular ejection fraction, by estimation, is 50 to 55%. The  left ventricle has low normal function. The left ventricle has no regional  wall motion abnormalities. Left ventricular diastolic parameters were  normal.   2. Right ventricular systolic function is mildly reduced. The right  ventricular size is normal. There is moderately elevated pulmonary artery  systolic pressure. The estimated right ventricular systolic pressure is  46.9 mmHg.   3. Left atrial size was mildly dilated.   4. Right atrial size was mild to moderately dilated.   5. The mitral valve is  normal in structure. Trivial mitral valve  regurgitation. No evidence of mitral stenosis.   6. The aortic valve is normal in structure. Aortic valve regurgitation is  trivial. No aortic stenosis is present.   7. The aortic root measurement of 4cm is normal when indexed for BSA.   8. The inferior vena cava is  dilated in size with >50% respiratory  variability, suggesting right atrial pressure of 8 mmHg.  - Comparison: LVEF 60-65% with mild focal basal septum hypertrophy, no RWMA 05/03/2015; LVEF 40-45%, grade 1 DD, mild MR 01/24/2016; LVEF 50-55%, global LV hypokinesis, mild RVH, RVSP 32.4 mmHg, moderate LA dilation, mild MR  Past Medical History:  Diagnosis Date   Atrial fibrillation (HCC)    BPH (benign prostatic hyperplasia)    Chronic combined systolic and diastolic congestive heart failure (HCC)    CKD (chronic kidney disease)    GERD (gastroesophageal reflux disease)    protonix  for control   Insomnia    Neck pain    Neuropathy     Past Surgical History:  Procedure Laterality Date   ANTERIOR CERVICAL DECOMPRESSION/DISCECTOMY FUSION 4 LEVELS N/A 10/27/2012   Procedure: Cervical Three-Four Cervical Four-Five Cervical Five-Six Cervical Six-Seven  Anterior cervical decompression/diskectomy/fusion;  Surgeon: Fairy Levels, MD;  Location: [facility];  Service: Neurosurgery;  Laterality: N/A;  Cervical Three-Four Cervical Four-Five Cervical Five-Six Cervical Six-Seven  Anterior cervical decompression/diskectomy/fusion   CARDIOVERSION N/A 09/20/2020   Procedure: CARDIOVERSION;  Surgeon: Kate Lonni CROME, MD;  Location: [facility] ENDOSCOPY;  Service: Cardiovascular;  Laterality: N/A;   FEMUR IM NAIL Right 03/08/2020   Procedure: INTRAMEDULLARY (IM) RETROGRADE FEMORAL NAILING;  Surgeon: Kendal Franky SQUIBB, MD;  Location: [facility] OR;  Service: Orthopedics;  Laterality: Right;   HARDWARE REMOVAL Right 03/31/2023   Procedure: HARDWARE REMOVAL;  Surgeon: Kendal Franky SQUIBB, MD;  Location: [facility];  Service: Orthopedics;  Laterality: Right;   INTRAMEDULLARY (IM) NAIL INTERTROCHANTERIC Right 03/31/2023   Procedure: INTRAMEDULLARY (IM) NAIL INTERTROCHANTERIC;  Surgeon: Kendal Franky SQUIBB, MD;  Location: [facility];  Service: Orthopedics;  Laterality: Right;   SHOULDER ARTHROSCOPY  2010   rt shoulder   SHOULDER ARTHROSCOPY   02/15/2011   Procedure: ARTHROSCOPY SHOULDER;  Surgeon: Lamar Prentice Collet;  Location: [facility];  Service: Orthopedics;  Laterality: Right;  Debridement   TONSILLECTOMY      MEDICATIONS:  apixaban  (ELIQUIS ) 5 MG TABS tablet   DENTA 5000 PLUS 1.1 % CREA dental cream   DULoxetine  (CYMBALTA ) 60 MG capsule   FARXIGA  10 MG TABS tablet   ferrous sulfate  325 (65 FE) MG tablet   furosemide  (LASIX ) 40 MG tablet   metoprolol  tartrate (LOPRESSOR ) 100 MG tablet   Multiple Vitamin (MULTIVITAMIN WITH MINERALS) TABS tablet   pantoprazole  (PROTONIX ) 40 MG tablet   QUEtiapine  (SEROQUEL ) 300 MG tablet   rOPINIRole  (REQUIP ) 1 MG tablet   sennosides-docusate sodium  (SENOKOT-S) 8.6-50 MG tablet   silodosin (RAPAFLO) 8 MG CAPS capsule   Vitamin D , Ergocalciferol , (DRISDOL ) 1.25 MG (50000 UNIT) CAPS capsule   Isaiah Ruder, PA-C Surgical Short Stay/Anesthesiology [facility] Phone [phone] [facility] Phone [phone] 02/25/2024 1:15 PM

## 2024-02-26 NOTE — Progress Notes (Signed)
 Spoke with the pt, he will arrive tom at 0700. He will stop clear liquids at 0630.

## 2024-02-27 ENCOUNTER — Encounter (HOSPITAL_COMMUNITY): Payer: Self-pay | Admitting: General Surgery

## 2024-02-27 ENCOUNTER — Ambulatory Visit (HOSPITAL_COMMUNITY)
Admission: RE | Admit: 2024-02-27 | Discharge: 2024-02-27 | Disposition: A | Discharge status: Home / Self Care | Attending: Otolaryngology | Admitting: Otolaryngology

## 2024-02-27 ENCOUNTER — Ambulatory Visit (HOSPITAL_COMMUNITY): Payer: Self-pay | Admitting: Vascular Surgery

## 2024-02-27 ENCOUNTER — Encounter (HOSPITAL_COMMUNITY)
Admission: RE | Disposition: A | Discharge status: Home / Self Care | Payer: Self-pay | Source: Home / Self Care | Attending: Otolaryngology

## 2024-02-27 DIAGNOSIS — I4891 Unspecified atrial fibrillation: Secondary | ICD-10-CM | POA: Diagnosis not present

## 2024-02-27 DIAGNOSIS — F32A Depression, unspecified: Secondary | ICD-10-CM | POA: Diagnosis not present

## 2024-02-27 DIAGNOSIS — N189 Chronic kidney disease, unspecified: Secondary | ICD-10-CM | POA: Insufficient documentation

## 2024-02-27 DIAGNOSIS — K219 Gastro-esophageal reflux disease without esophagitis: Secondary | ICD-10-CM | POA: Insufficient documentation

## 2024-02-27 DIAGNOSIS — C44229 Squamous cell carcinoma of skin of left ear and external auricular canal: Secondary | ICD-10-CM

## 2024-02-27 DIAGNOSIS — J449 Chronic obstructive pulmonary disease, unspecified: Secondary | ICD-10-CM | POA: Insufficient documentation

## 2024-02-27 DIAGNOSIS — Z87891 Personal history of nicotine dependence: Secondary | ICD-10-CM | POA: Diagnosis not present

## 2024-02-27 DIAGNOSIS — I5042 Chronic combined systolic (congestive) and diastolic (congestive) heart failure: Secondary | ICD-10-CM | POA: Diagnosis not present

## 2024-02-27 DIAGNOSIS — N400 Enlarged prostate without lower urinary tract symptoms: Secondary | ICD-10-CM | POA: Diagnosis not present

## 2024-02-27 DIAGNOSIS — C44209 Unspecified malignant neoplasm of skin of left ear and external auricular canal: Secondary | ICD-10-CM | POA: Diagnosis not present

## 2024-02-27 DIAGNOSIS — I509 Heart failure, unspecified: Secondary | ICD-10-CM

## 2024-02-27 DIAGNOSIS — Z7901 Long term (current) use of anticoagulants: Secondary | ICD-10-CM | POA: Insufficient documentation

## 2024-02-27 HISTORY — DX: Unspecified atrial fibrillation: I48.91

## 2024-02-27 HISTORY — PX: EXCISION MASS HEAD: SHX6702

## 2024-02-27 HISTORY — DX: Chronic kidney disease, unspecified: N18.9

## 2024-02-27 HISTORY — PX: COMPLEX WOUND CLOSURE: SHX6446

## 2024-02-27 SURGERY — EXCISION, MASS, HEAD
Anesthesia: Monitor Anesthesia Care | Site: Ear | Laterality: Left

## 2024-02-27 MED ORDER — LACTATED RINGERS IV SOLN: INTRAVENOUS | Status: DC

## 2024-02-27 MED ORDER — METOPROLOL TARTRATE 100 MG PO TABS: 100.0000 mg | ORAL_TABLET | Freq: Once | ORAL | Status: AC

## 2024-02-27 MED ORDER — 0.9 % SODIUM CHLORIDE (POUR BTL) OPTIME
TOPICAL | Status: DC | PRN
  Administered 2024-02-27: 1000 mL

## 2024-02-27 MED ORDER — PROPOFOL 10 MG/ML IV BOLUS
INTRAVENOUS | Status: AC
  Filled 2024-02-27: qty 20

## 2024-02-27 MED ORDER — CHLORHEXIDINE GLUCONATE 0.12 % MT SOLN
15.0000 mL | Freq: Once | OROMUCOSAL | Status: AC
  Administered 2024-02-27: 15 mL via OROMUCOSAL
  Filled 2024-02-27: qty 15

## 2024-02-27 MED ORDER — ONDANSETRON HCL 4 MG/2ML IJ SOLN: 4.0000 mg | Freq: Once | INTRAMUSCULAR | Status: DC | PRN

## 2024-02-27 MED ORDER — ONDANSETRON HCL 4 MG/2ML IJ SOLN
INTRAMUSCULAR | Status: DC | PRN
  Administered 2024-02-27: 4 mg via INTRAVENOUS

## 2024-02-27 MED ORDER — LIDOCAINE 2% (20 MG/ML) 5 ML SYRINGE
INTRAMUSCULAR | Status: DC | PRN
  Administered 2024-02-27: 40 mg via INTRAVENOUS

## 2024-02-27 MED ORDER — ORAL CARE MOUTH RINSE: 15.0000 mL | Freq: Once | OROMUCOSAL | Status: AC

## 2024-02-27 MED ORDER — PROPOFOL 10 MG/ML IV BOLUS
INTRAVENOUS | Status: DC | PRN
  Administered 2024-02-27: 20 mg via INTRAVENOUS
  Administered 2024-02-27: 50 mg via INTRAVENOUS

## 2024-02-27 MED ORDER — ACETAMINOPHEN 500 MG PO TABS: 1000.0000 mg | ORAL_TABLET | Freq: Four times a day (QID) | ORAL | 2 refills | Status: AC | PRN

## 2024-02-27 MED ORDER — FENTANYL CITRATE (PF) 100 MCG/2ML IJ SOLN
INTRAMUSCULAR | Status: DC | PRN
  Administered 2024-02-27 (×2): 25 ug via INTRAVENOUS

## 2024-02-27 MED ORDER — LIDOCAINE-EPINEPHRINE 1 %-1:100000 IJ SOLN
INTRAMUSCULAR | Status: DC | PRN
  Administered 2024-02-27: 14 mL

## 2024-02-27 MED ORDER — LIDOCAINE-EPINEPHRINE 1 %-1:100000 IJ SOLN
INTRAMUSCULAR | Status: AC
  Filled 2024-02-27: qty 1

## 2024-02-27 MED ORDER — IBUPROFEN 200 MG PO TABS: 400.0000 mg | ORAL_TABLET | Freq: Four times a day (QID) | ORAL | 2 refills | Status: DC | PRN

## 2024-02-27 MED ORDER — METOPROLOL TARTRATE 50 MG PO TABS
ORAL_TABLET | ORAL | Status: AC
  Administered 2024-02-27: 100 mg via ORAL
  Filled 2024-02-27: qty 2

## 2024-02-27 MED ORDER — MIDAZOLAM HCL (PF) 2 MG/2ML IJ SOLN
1.0000 mg | Freq: Once | INTRAMUSCULAR | Status: AC
  Administered 2024-02-27: 1 mg via INTRAVENOUS

## 2024-02-27 MED ORDER — CEFAZOLIN SODIUM-DEXTROSE 2-3 GM-%(50ML) IV SOLR
INTRAVENOUS | Status: DC | PRN
  Administered 2024-02-27: 2 g via INTRAVENOUS

## 2024-02-27 MED ORDER — OXYCODONE HCL 5 MG PO TABS: 5.0000 mg | ORAL_TABLET | Freq: Once | ORAL | Status: DC | PRN

## 2024-02-27 MED ORDER — FENTANYL CITRATE (PF) 100 MCG/2ML IJ SOLN: 25.0000 ug | INTRAMUSCULAR | Status: DC | PRN

## 2024-02-27 MED ORDER — OXYCODONE HCL 5 MG/5ML PO SOLN: 5.0000 mg | Freq: Once | ORAL | Status: DC | PRN

## 2024-02-27 MED ORDER — OXYCODONE HCL 5 MG PO TABS: 5.0000 mg | ORAL_TABLET | Freq: Four times a day (QID) | ORAL | 0 refills | Status: DC | PRN

## 2024-02-27 MED ORDER — EPHEDRINE SULFATE-NACL 50-0.9 MG/10ML-% IV SOSY
PREFILLED_SYRINGE | INTRAVENOUS | Status: DC | PRN
  Administered 2024-02-27: 5 mg via INTRAVENOUS
  Administered 2024-02-27: 10 mg via INTRAVENOUS

## 2024-02-27 MED ORDER — FENTANYL CITRATE (PF) 100 MCG/2ML IJ SOLN
INTRAMUSCULAR | Status: AC
  Filled 2024-02-27: qty 2

## 2024-02-27 MED ORDER — PHENYLEPHRINE 80 MCG/ML (10ML) SYRINGE FOR IV PUSH (FOR BLOOD PRESSURE SUPPORT)
PREFILLED_SYRINGE | INTRAVENOUS | Status: DC | PRN
  Administered 2024-02-27: 80 ug via INTRAVENOUS
  Administered 2024-02-27 (×2): 160 ug via INTRAVENOUS
  Administered 2024-02-27: 80 ug via INTRAVENOUS
  Administered 2024-02-27: 160 ug via INTRAVENOUS
  Administered 2024-02-27 (×2): 80 ug via INTRAVENOUS
  Administered 2024-02-27: 160 ug via INTRAVENOUS

## 2024-02-27 MED ORDER — BACITRACIN ZINC 500 UNIT/GM EX OINT
TOPICAL_OINTMENT | CUTANEOUS | Status: DC | PRN
  Administered 2024-02-27: 1 via TOPICAL

## 2024-02-27 MED ORDER — BACITRACIN ZINC 500 UNIT/GM EX OINT
TOPICAL_OINTMENT | CUTANEOUS | Status: AC
  Filled 2024-02-27: qty 28.35

## 2024-02-27 MED ORDER — MIDAZOLAM HCL 2 MG/2ML IJ SOLN
INTRAMUSCULAR | Status: AC
  Filled 2024-02-27: qty 2

## 2024-02-27 MED ORDER — MEPERIDINE HCL 25 MG/ML IJ SOLN: 6.2500 mg | INTRAMUSCULAR | Status: DC | PRN

## 2024-02-27 MED ORDER — PROPOFOL 500 MG/50ML IV EMUL
INTRAVENOUS | Status: DC | PRN
  Administered 2024-02-27: 75 ug/kg/min via INTRAVENOUS

## 2024-02-27 SURGICAL SUPPLY — 35 items
BAG COUNTER SPONGE SURGICOUNT (BAG) ×1 IMPLANT
BLADE SURG 11 STRL SS (BLADE) IMPLANT
BLADE SURG 15 STRL LF DISP TIS (BLADE) ×1 IMPLANT
CANISTER SUCTION 3000ML PPV (SUCTIONS) ×1 IMPLANT
CLIP TI MEDIUM 24 (CLIP) ×1 IMPLANT
CLIP TI WIDE RED SMALL 24 (CLIP) ×1 IMPLANT
CNTNR URN SCR LID CUP LEK RST (MISCELLANEOUS) IMPLANT
CORD BIPOLAR FORCEPS 12FT (ELECTRODE) ×1 IMPLANT
COVER SURGICAL LIGHT HANDLE (MISCELLANEOUS) ×1 IMPLANT
DRAPE INCISE IOBAN 66X45 STRL (DRAPES) ×1 IMPLANT
ELECT COATED BLADE 2.86 ST (ELECTRODE) ×1 IMPLANT
ELECTRODE REM PT RTRN 9FT ADLT (ELECTROSURGICAL) ×1 IMPLANT
GAUZE XEROFORM 5X9 LF (GAUZE/BANDAGES/DRESSINGS) IMPLANT
GLOVE BIO SURGEON STRL SZ 6.5 (GLOVE) ×1 IMPLANT
GLOVE BIO SURGEON STRL SZ7.5 (GLOVE) ×1 IMPLANT
GOWN STRL REUS W/ TWL LRG LVL3 (GOWN DISPOSABLE) ×1 IMPLANT
GOWN STRL REUS W/ TWL XL LVL3 (GOWN DISPOSABLE) ×1 IMPLANT
KIT BASIN OR (CUSTOM PROCEDURE TRAY) ×1 IMPLANT
KIT TURNOVER KIT B (KITS) ×1 IMPLANT
MARKER SKIN DUAL TIP RULER LAB (MISCELLANEOUS) ×1 IMPLANT
NEEDLE HYPO 25GX1X1/2 BEV (NEEDLE) ×1 IMPLANT
NEEDLE HYPO 25X1 1.5 SAFETY (NEEDLE) IMPLANT
PAD ARMBOARD POSITIONER FOAM (MISCELLANEOUS) ×2 IMPLANT
PENCIL SMOKE EVACUATOR (MISCELLANEOUS) ×1 IMPLANT
SOLN 0.9% NACL POUR BTL 1000ML (IV SOLUTION) ×1 IMPLANT
SOLN STERILE WATER BTL 1000 ML (IV SOLUTION) ×1 IMPLANT
SPONGE INTESTINAL PEANUT (DISPOSABLE) ×1 IMPLANT
SUT PDS AB 4-0 SH 27 (SUTURE) IMPLANT
SUT PLAIN GUT FAST 5-0 (SUTURE) IMPLANT
SUT SILK 2 0 SH CR/8 (SUTURE) IMPLANT
SUT SILK 2 0 TIES 10X30 (SUTURE) ×1 IMPLANT
SUT SILK 3 0 TIES 10X30 (SUTURE) ×1 IMPLANT
SUT VICRYL RAPIDE 4/0 PS 2 (SUTURE) IMPLANT
TOWEL GREEN STERILE FF (TOWEL DISPOSABLE) ×1 IMPLANT
TRAY ENT MC OR (CUSTOM PROCEDURE TRAY) ×1 IMPLANT

## 2024-02-27 NOTE — Op Note (Addendum)
 Otolaryngology Operative note  DAMICHAEL HOFMAN Date/Time of Admission: 02/27/2024  6:39 AM  CSN: [id number];FMW:[id number]  DOB: 1940/09/24 Age: 84 y.o. 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  Post-Op Diagnosis: Same  Procedure: Excision of malignant lesion of left Ear - 4.5 cm diameter (CPT 11646-LT) Complex Repair of Left Ear Wound 4.5 cm (CPT 13152-LT)   Surgeon: Eldora Blanch, MD  Anesthesia type:  See Log  Anesthesiologist: Anesthesiologist: Mallory Manus, MD   Staff: Circulator: Storm Maryelizabeth CROME, RN Scrub Person: Miriam Shields E  Implants: Xeroform Bolster - left ear  Specimens: ID Type Source Tests Collected by Time Destination  1 : Left auricle, short stitch marks superior, long anterior Tissue PATH ENT excision SURGICAL PATHOLOGY Blanch Eldora NOVAK, MD 02/27/2024 1020   2 : Left auricle, superior margin Tissue PATH ENT excision SURGICAL PATHOLOGY Blanch Eldora NOVAK, MD 02/27/2024 1022   3 : Left auricle, posterior margin Tissue PATH ENT excision SURGICAL PATHOLOGY Blanch Eldora NOVAK, MD 02/27/2024 1031   4 : Left auricle, Inferior margin Tissue PATH ENT excision SURGICAL PATHOLOGY Blanch Eldora NOVAK, MD 02/27/2024 1033   5 : Left auricle, anterior margin Tissue PATH ENT excision SURGICAL PATHOLOGY Blanch Eldora NOVAK, MD 02/27/2024 1034   6 : Left auricle, short stitch marks superior, long anterior Tissue PATH ENT excision SURGICAL PATHOLOGY Blanch Eldora NOVAK, MD 02/27/2024 1052     EBL: 40cc  Drains: None  Post-op disposition and condition: PACU, hemodynamically stable  Findings: Left Auricular (Helical Rim) skin cancer -- excised en bloc including cartilage with wedge excision. Frozen sections negative for carcinoma.   Complications: None apparent  Indications and consent:  Neeraj Housand is a 84 y.o. male with diagnoses above. The patient's options were discussed, including risks/benefits/alternatives for each option. Patient expressed  understanding, and despite these risks which include hematoma, dehiscence, and poor cosmetic or functional outcome, consented and decided to proceed with above procedures. Informed consent was signed before proceeding.  Procedure:  The patient was identified in the preoperative area, consent confirmed, transported to the operating suite. They were transferred to the operating room table and placed in a supine position. After induction of anesthesia, a surgeon initiated time out was performed. The patient was then padded.     The patient was prepped and draped in the usual fashion for this procedure. A marking pen was used to make 5 mm margin around the helix lesion. The incision line was injected with 4cc of 1% lidocaine  with 1:100,000 epinephrine  and an additional circum-auricular block was performed using same local anesthetic mix, using 10 cc for this portion.  A wedge was marked going anteriorly and using an 11 blade, a full thickness wedge excision including cartilage was performed, totaling diameter of 4.5 cm. Frozen sections of the anterior, superior, posterior and inferior margins was done (not from the specimen) which returned as negative for carcinoma.  The defect measured 4.5 cm and there was exposed helical and auricular cartilage subsequently. Hemostasis was achieved with bipolar cautery. The defect was undermined to reduce tension over the skin and the cartilage was first reapproximated using interrupted 4-0 PDS sutures. Dermal 4-0 vicryl sutures were then used to re-approximate the dermis and bring the helix into continuity. The skin was then re-approximated with 5-0 interrupted fast gut suture. A xeroform bolster was placed and secured using 2-0 silk sutures.   After cleaning and drying the skin, bacitracin  was applied to the incision and the bolster.  With the surgical portion of the procedure complete, all instrumentation was then removed from the operative field. Patient was returned  to the care of the anesthesia team. Patient was then weaned from the anesthetic and transported to the PACU in stable condition.  Sharell Hilmer B Omaira Mellen

## 2024-02-27 NOTE — Discharge Instructions (Addendum)
 Post Anesthesia Guidelines  During recovery from anesthesia  You may feel drowsy and reflexes may be slowed for 24 hours:  -Do not drive, use machinery, appliances, ride bicycles or scooters  -Do not consume alcohol   -Do not make important decisions   Eating and drinking:  -Drink plenty of liquids today  -Avoid fried or spicy foods today  -Return to your regular diet slowly over the next 24 hours  -Please call if unable to keep fluids down  Surgery Discharge Instructions:  Call clinic or return to ED if you: - develop a fever greater than 101.4 - have shaking chills or are feeling ill - become short of breath - have uncontrollable nausea or vomiting - can't hold down food or liquids or feel as though you are getting dehydrated - have leakage or drainage from wound - some bloody drainage is expected - develop significant redness, pain at incision(s) or wound opens up/separates - any other acute events, problems, or concerns  Wound Care/Dressings/Drain Instructions:  - The ear has a yellow gauze which is stitched. This will come off in clinic. Keep the left ear dry, otherwise you can shower. - For pain, take tylenol  and ibuprofen . If that is not sufficient, a stronger pain medication (oxycodone  - 5 mg tablet every 4-6 hours as needed) has been prescribed  Medications: - Resume your regular home medications except as detailed in the medication reconciliation.  - Restart your blood thinner on Monday (January 12)  Follow Up:  - A follow up appointment should be scheduled for you after discharge. If you do not know about it, please contact us  to ask  Activity/Restrictions:  - Resume your regular activities, as tolerated. Avoid heavy lifting or straining for 1 week.  Diet: - Resume your regular diet, as tolerated  Additional Instructions: - Please take an over the counter stool softener while taking narcotic pain medication - DO NOT MIX NARCOTIC PAIN MEDICATIONS OR TAKE  NARCOTIC PRESCRIPTIONS AT THE SAME TIME - DO NOT DRIVE OR OPERATE HEAVY MACHINERY WHILE ON NARCOTICS  - DO NOT TAKE MORE THAN 4 GRAMS (4000mg ) OF TYLENOL  (ACETAMINOPHEN ) IN 24 HOURS

## 2024-02-27 NOTE — H&P (Signed)
 Pre-Operative H&P - Day Of Surgery Patient Name: Dustin [name] Date:   02/27/2024  HPI: Dustin [name] is a 84 y.o. male who presents today for operative treatment of left auricular skin cancer. Patient denies recent significant changes to health or significant new medications or physiologic change in condition which would immediately impact plans. No new types of therapy has been initiated that would change the plan or the appropriateness of the plan.   ROS:  A complete review of systems was obtained and is otherwise negative.   PMH:  Past Medical History:  Diagnosis Date   Atrial fibrillation (HCC)    BPH (benign prostatic hyperplasia)    Chronic combined systolic and diastolic congestive heart failure (HCC)    CKD (chronic kidney disease)    GERD (gastroesophageal reflux disease)    protonix  for control   Insomnia    Neck pain    Neuropathy     PSH:  Past Surgical History:  Procedure Laterality Date   ANTERIOR CERVICAL DECOMPRESSION/DISCECTOMY FUSION 4 LEVELS N/A 10/27/2012   Procedure: Cervical Three-Four Cervical Four-Five Cervical Five-Six Cervical Six-Seven  Anterior cervical decompression/diskectomy/fusion;  Surgeon: Fairy Levels, MD;  Location: [facility];  Service: Neurosurgery;  Laterality: N/A;  Cervical Three-Four Cervical Four-Five Cervical Five-Six Cervical Six-Seven  Anterior cervical decompression/diskectomy/fusion   CARDIOVERSION N/A 09/20/2020   Procedure: CARDIOVERSION;  Surgeon: Kate Lonni CROME, MD;  Location: [facility];  Service: Cardiovascular;  Laterality: N/A;   FEMUR IM NAIL Right 03/08/2020   Procedure: INTRAMEDULLARY (IM) RETROGRADE FEMORAL NAILING;  Surgeon: Kendal Franky SQUIBB, MD;  Location: [facility];  Service: Orthopedics;  Laterality: Right;   HARDWARE REMOVAL Right 03/31/2023   Procedure: HARDWARE REMOVAL;  Surgeon: Kendal Franky SQUIBB, MD;  Location: [facility];  Service: Orthopedics;  Laterality: Right;   INTRAMEDULLARY (IM) NAIL INTERTROCHANTERIC Right 03/31/2023    Procedure: INTRAMEDULLARY (IM) NAIL INTERTROCHANTERIC;  Surgeon: Kendal Franky SQUIBB, MD;  Location: [facility];  Service: Orthopedics;  Laterality: Right;   SHOULDER ARTHROSCOPY  2010   rt shoulder   SHOULDER ARTHROSCOPY  02/15/2011   Procedure: ARTHROSCOPY SHOULDER;  Surgeon: Lamar Prentice Collet;  Location: [facility];  Service: Orthopedics;  Laterality: Right;  Debridement   TONSILLECTOMY      MEDS:  Current Medications[1]  ALLERGIES: Desmopressin acetate and Zoloft  [sertraline ]  EXAM: Vitals: BP (!) 139/92   Pulse 98   Temp 97.7 F (36.5 C) (Oral)   Resp 18   Ht 6' 2 (1.88 m)   Wt 106.6 kg   SpO2 94%   BMI 30.17 kg/m   General Awake, at baseline alertness.   HEENT No scleral icterus or conjunctival hemorrhage. Globe position appears normal. External ears  normal. Nose patent without rhinorrhea. No lymphadenopathy. No thyromegaly  Cardiovascular No cyanosis.  Pulmonary No audible stridor. Breathing easily with no labor.  Neuro Symmetric facial movement.   Psychiatry Appropriate affect and mood.  Skin No scars or lesions on face or neck except left helical rim carcinoma  Extermities Moves all extremities with normal range of motion.   Other Findings None.   Assessment & Plan: Dustin [name] has diagnoses of left auricular squamous cell carcinoma and will go to the OR today for partial auriculectomy, local tissue rearrangement, other indicated procedures. Informed consent was obtained and available in EMR today. All questions have been answered, and risks/benefits/alternatives of procedure as noted in the consent were discussed in a quiet area. Questions were invited and answered. The patient expressed understanding, provided consent and wished to  proceed despite risks and he understands frozen section not 100% accurate so risk of re-excision/positive margins  Dustin [name] 02/27/2024 9:30 AM     [1]  Current Facility-Administered Medications:    lactated ringers   infusion, , Intravenous, Continuous, Mallory Manus, MD, Last Rate: 10 mL/hr at 02/27/24 0925, Continued from Pre-op at 02/27/24 [phone]

## 2024-02-27 NOTE — Anesthesia Postprocedure Evaluation (Signed)
"   Anesthesia Post Note  Patient: DUANTE AROCHO  Procedure(s) Performed: EXCISION, MASS, HEAD (Left: Ear) COMPLEX CLOSURE, WOUND (Left: Ear)     Patient location during evaluation: PACU Anesthesia Type: MAC Level of consciousness: awake and alert Pain management: pain level controlled Vital Signs Assessment: post-procedure vital signs reviewed and stable Respiratory status: spontaneous breathing, nonlabored ventilation, respiratory function stable and patient connected to nasal cannula oxygen Cardiovascular status: stable and blood pressure returned to baseline Postop Assessment: no apparent nausea or vomiting Anesthetic complications: no   There were no known notable events for this encounter.  Last Vitals:  Vitals:   02/27/24 1230 02/27/24 1245  BP: 106/82 (!) 112/57  Pulse: 94 84  Resp: (!) 21 17  Temp:    SpO2: 92% 92%    Last Pain:  Vitals:   02/27/24 1149  TempSrc:   PainSc: 0-No pain                 Huston Stonehocker      "

## 2024-02-27 NOTE — Transfer of Care (Signed)
 Immediate Anesthesia Transfer of Care Note  Patient: Dustin [name]  Procedure(s) Performed: EXCISION, MASS, HEAD (Left: Ear) COMPLEX CLOSURE, WOUND (Left: Ear)  Patient Location: PACU  Anesthesia Type:MAC  Level of Consciousness: awake and drowsy  Airway & Oxygen Therapy: Patient Spontanous Breathing  Post-op Assessment: Report given to RN  Post vital signs: stable  Last Vitals:  Vitals Value Taken Time  BP 105/70 02/27/24 11:49  Temp    Pulse 85 02/27/24 11:55  Resp 18 02/27/24 11:55  SpO2 88 % 02/27/24 11:55  Vitals shown include unfiled device data.  Last Pain:  Vitals:   02/27/24 0658  TempSrc: Oral         Complications: There were no known notable events for this encounter.

## 2024-02-28 ENCOUNTER — Encounter (HOSPITAL_COMMUNITY): Payer: Self-pay | Admitting: Otolaryngology

## 2024-03-01 ENCOUNTER — Observation Stay (HOSPITAL_COMMUNITY)
Admission: EM | Admit: 2024-03-01 | Discharge: 2024-03-02 | Disposition: A | Attending: Internal Medicine | Admitting: Internal Medicine

## 2024-03-01 ENCOUNTER — Other Ambulatory Visit: Payer: Self-pay

## 2024-03-01 ENCOUNTER — Emergency Department (HOSPITAL_COMMUNITY)

## 2024-03-01 ENCOUNTER — Encounter (HOSPITAL_COMMUNITY): Payer: Self-pay

## 2024-03-01 DIAGNOSIS — R4182 Altered mental status, unspecified: Secondary | ICD-10-CM | POA: Diagnosis present

## 2024-03-01 DIAGNOSIS — F32A Depression, unspecified: Secondary | ICD-10-CM | POA: Diagnosis not present

## 2024-03-01 DIAGNOSIS — I130 Hypertensive heart and chronic kidney disease with heart failure and stage 1 through stage 4 chronic kidney disease, or unspecified chronic kidney disease: Secondary | ICD-10-CM | POA: Insufficient documentation

## 2024-03-01 DIAGNOSIS — Z87891 Personal history of nicotine dependence: Secondary | ICD-10-CM | POA: Insufficient documentation

## 2024-03-01 DIAGNOSIS — G9340 Encephalopathy, unspecified: Secondary | ICD-10-CM | POA: Diagnosis not present

## 2024-03-01 DIAGNOSIS — G9341 Metabolic encephalopathy: Secondary | ICD-10-CM | POA: Diagnosis not present

## 2024-03-01 DIAGNOSIS — Z79899 Other long term (current) drug therapy: Secondary | ICD-10-CM | POA: Insufficient documentation

## 2024-03-01 DIAGNOSIS — I4819 Other persistent atrial fibrillation: Secondary | ICD-10-CM | POA: Diagnosis present

## 2024-03-01 DIAGNOSIS — N400 Enlarged prostate without lower urinary tract symptoms: Secondary | ICD-10-CM | POA: Diagnosis not present

## 2024-03-01 DIAGNOSIS — N1831 Chronic kidney disease, stage 3a: Secondary | ICD-10-CM | POA: Diagnosis not present

## 2024-03-01 DIAGNOSIS — R410 Disorientation, unspecified: Principal | ICD-10-CM | POA: Insufficient documentation

## 2024-03-01 DIAGNOSIS — E7800 Pure hypercholesterolemia, unspecified: Secondary | ICD-10-CM | POA: Diagnosis present

## 2024-03-01 DIAGNOSIS — K219 Gastro-esophageal reflux disease without esophagitis: Secondary | ICD-10-CM | POA: Diagnosis present

## 2024-03-01 DIAGNOSIS — C44209 Unspecified malignant neoplasm of skin of left ear and external auricular canal: Secondary | ICD-10-CM | POA: Diagnosis present

## 2024-03-01 DIAGNOSIS — R443 Hallucinations, unspecified: Secondary | ICD-10-CM

## 2024-03-01 DIAGNOSIS — R7401 Elevation of levels of liver transaminase levels: Secondary | ICD-10-CM | POA: Diagnosis not present

## 2024-03-01 DIAGNOSIS — N390 Urinary tract infection, site not specified: Secondary | ICD-10-CM | POA: Diagnosis not present

## 2024-03-01 DIAGNOSIS — I714 Abdominal aortic aneurysm, without rupture, unspecified: Secondary | ICD-10-CM | POA: Diagnosis present

## 2024-03-01 DIAGNOSIS — I5042 Chronic combined systolic (congestive) and diastolic (congestive) heart failure: Secondary | ICD-10-CM | POA: Diagnosis present

## 2024-03-01 DIAGNOSIS — R2681 Unsteadiness on feet: Secondary | ICD-10-CM | POA: Diagnosis present

## 2024-03-01 DIAGNOSIS — J449 Chronic obstructive pulmonary disease, unspecified: Secondary | ICD-10-CM | POA: Diagnosis present

## 2024-03-01 DIAGNOSIS — I4891 Unspecified atrial fibrillation: Secondary | ICD-10-CM

## 2024-03-01 LAB — CBC WITH DIFFERENTIAL/PLATELET
Abs Immature Granulocytes: 0.06 K/uL (ref 0.00–0.07)
Basophils Absolute: 0 K/uL (ref 0.0–0.1)
Basophils Relative: 0 %
Eosinophils Absolute: 0.2 K/uL (ref 0.0–0.5)
Eosinophils Relative: 3 %
HCT: 36.8 % — ABNORMAL LOW (ref 39.0–52.0)
Hemoglobin: 12.3 g/dL — ABNORMAL LOW (ref 13.0–17.0)
Immature Granulocytes: 1 %
Lymphocytes Relative: 14 %
Lymphs Abs: 0.9 K/uL (ref 0.7–4.0)
MCH: 33.4 pg (ref 26.0–34.0)
MCHC: 33.4 g/dL (ref 30.0–36.0)
MCV: 100 fL (ref 80.0–100.0)
Monocytes Absolute: 0.8 K/uL (ref 0.1–1.0)
Monocytes Relative: 13 %
Neutro Abs: 4.4 K/uL (ref 1.7–7.7)
Neutrophils Relative %: 69 %
Platelets: UNDETERMINED K/uL (ref 150–400)
RBC: 3.68 MIL/uL — ABNORMAL LOW (ref 4.22–5.81)
RDW: 14.1 % (ref 11.5–15.5)
WBC: 6.4 K/uL (ref 4.0–10.5)
nRBC: 0.5 % — ABNORMAL HIGH (ref 0.0–0.2)

## 2024-03-01 LAB — COMPREHENSIVE METABOLIC PANEL WITH GFR
ALT: 40 U/L (ref 0–44)
AST: 140 U/L — ABNORMAL HIGH (ref 15–41)
Albumin: 4.3 g/dL (ref 3.5–5.0)
Alkaline Phosphatase: 315 U/L — ABNORMAL HIGH (ref 38–126)
Anion gap: 14 (ref 5–15)
BUN: 18 mg/dL (ref 8–23)
CO2: 27 mmol/L (ref 22–32)
Calcium: 9 mg/dL (ref 8.9–10.3)
Chloride: 97 mmol/L — ABNORMAL LOW (ref 98–111)
Creatinine, Ser: 1.46 mg/dL — ABNORMAL HIGH (ref 0.61–1.24)
GFR, Estimated: 48 mL/min — ABNORMAL LOW
Glucose, Bld: 108 mg/dL — ABNORMAL HIGH (ref 70–99)
Potassium: 3.4 mmol/L — ABNORMAL LOW (ref 3.5–5.1)
Sodium: 138 mmol/L (ref 135–145)
Total Bilirubin: 2.3 mg/dL — ABNORMAL HIGH (ref 0.0–1.2)
Total Protein: 7.2 g/dL (ref 6.5–8.1)

## 2024-03-01 LAB — I-STAT VENOUS BLOOD GAS, ED
Acid-Base Excess: 3 mmol/L — ABNORMAL HIGH (ref 0.0–2.0)
Bicarbonate: 28 mmol/L (ref 20.0–28.0)
Calcium, Ion: 1.01 mmol/L — ABNORMAL LOW (ref 1.15–1.40)
HCT: 37 % — ABNORMAL LOW (ref 39.0–52.0)
Hemoglobin: 12.6 g/dL — ABNORMAL LOW (ref 13.0–17.0)
O2 Saturation: 100 %
Potassium: 3.5 mmol/L (ref 3.5–5.1)
Sodium: 136 mmol/L (ref 135–145)
TCO2: 29 mmol/L (ref 22–32)
pCO2, Ven: 43 mmHg — ABNORMAL LOW (ref 44–60)
pH, Ven: 7.422 (ref 7.25–7.43)
pO2, Ven: 190 mmHg — ABNORMAL HIGH (ref 32–45)

## 2024-03-01 LAB — I-STAT CHEM 8, ED
BUN: 20 mg/dL (ref 8–23)
Calcium, Ion: 1.01 mmol/L — ABNORMAL LOW (ref 1.15–1.40)
Chloride: 97 mmol/L — ABNORMAL LOW (ref 98–111)
Creatinine, Ser: 1.5 mg/dL — ABNORMAL HIGH (ref 0.61–1.24)
Glucose, Bld: 109 mg/dL — ABNORMAL HIGH (ref 70–99)
HCT: 37 % — ABNORMAL LOW (ref 39.0–52.0)
Hemoglobin: 12.6 g/dL — ABNORMAL LOW (ref 13.0–17.0)
Potassium: 3.4 mmol/L — ABNORMAL LOW (ref 3.5–5.1)
Sodium: 136 mmol/L (ref 135–145)
TCO2: 29 mmol/L (ref 22–32)

## 2024-03-01 LAB — AMMONIA: Ammonia: 40 umol/L — ABNORMAL HIGH (ref 9–35)

## 2024-03-01 LAB — URINALYSIS, W/ REFLEX TO CULTURE (INFECTION SUSPECTED)
Bacteria, UA: NONE SEEN
Bilirubin Urine: NEGATIVE
Glucose, UA: NEGATIVE mg/dL
Ketones, ur: 5 mg/dL — AB
Leukocytes,Ua: NEGATIVE
Nitrite: NEGATIVE
Protein, ur: 30 mg/dL — AB
Specific Gravity, Urine: 1.018 (ref 1.005–1.030)
pH: 5 (ref 5.0–8.0)

## 2024-03-01 LAB — I-STAT CG4 LACTIC ACID, ED
Lactic Acid, Venous: 2.2 mmol/L (ref 0.5–1.9)
Lactic Acid, Venous: 1 mmol/L (ref 0.5–1.9)

## 2024-03-01 LAB — CBG MONITORING, ED: Glucose-Capillary: 121 mg/dL — ABNORMAL HIGH (ref 70–99)

## 2024-03-01 LAB — PROTIME-INR
INR: 1.2 (ref 0.8–1.2)
Prothrombin Time: 16.1 s — ABNORMAL HIGH (ref 11.4–15.2)

## 2024-03-01 LAB — ETHANOL: Alcohol, Ethyl (B): <15 mg/dL

## 2024-03-01 MED ORDER — QUETIAPINE FUMARATE 100 MG PO TABS
300.0000 mg | ORAL_TABLET | Freq: Every day | ORAL | Status: DC
  Administered 2024-03-01: 300 mg via ORAL
  Filled 2024-03-01: qty 3

## 2024-03-01 MED ORDER — DAPAGLIFLOZIN PROPANEDIOL 10 MG PO TABS
10.0000 mg | ORAL_TABLET | Freq: Every day | ORAL | Status: DC
  Administered 2024-03-02: 10 mg via ORAL
  Filled 2024-03-01: qty 1

## 2024-03-01 MED ORDER — HYDRALAZINE HCL 20 MG/ML IJ SOLN: 10.0000 mg | INTRAMUSCULAR | Status: DC | PRN

## 2024-03-01 MED ORDER — IPRATROPIUM BROMIDE 0.02 % IN SOLN: 0.5000 mg | Freq: Four times a day (QID) | RESPIRATORY_TRACT | Status: DC | PRN

## 2024-03-01 MED ORDER — METOPROLOL TARTRATE 5 MG/5ML IV SOLN
5.0000 mg | INTRAVENOUS | Status: DC | PRN
  Administered 2024-03-01: 5 mg via INTRAVENOUS
  Filled 2024-03-01: qty 5

## 2024-03-01 MED ORDER — SODIUM CHLORIDE 0.9% FLUSH
3.0000 mL | Freq: Two times a day (BID) | INTRAVENOUS | Status: DC
  Administered 2024-03-01 – 2024-03-02 (×3): 3 mL via INTRAVENOUS

## 2024-03-01 MED ORDER — VITAMIN D (ERGOCALCIFEROL) 1.25 MG (50000 UNIT) PO CAPS: 50000.0000 [IU] | ORAL_CAPSULE | ORAL | Status: DC

## 2024-03-01 MED ORDER — HEPARIN SODIUM (PORCINE) 5000 UNIT/ML IJ SOLN: 5000.0000 [IU] | Freq: Three times a day (TID) | INTRAMUSCULAR | Status: DC

## 2024-03-01 MED ORDER — SODIUM CHLORIDE 0.9 % IV SOLN: INTRAVENOUS | Status: AC

## 2024-03-01 MED ORDER — ROPINIROLE HCL 1 MG PO TABS
2.0000 mg | ORAL_TABLET | Freq: Every day | ORAL | Status: DC
  Administered 2024-03-01: 2 mg via ORAL
  Filled 2024-03-01: qty 2

## 2024-03-01 MED ORDER — FLEET ENEMA RE ENEM: 1.0000 | ENEMA | Freq: Once | RECTAL | Status: DC | PRN

## 2024-03-01 MED ORDER — METOPROLOL TARTRATE 25 MG PO TABS
100.0000 mg | ORAL_TABLET | Freq: Two times a day (BID) | ORAL | Status: DC
  Administered 2024-03-01 – 2024-03-02 (×2): 100 mg via ORAL
  Filled 2024-03-01 (×3): qty 4

## 2024-03-01 MED ORDER — BISACODYL 5 MG PO TBEC: 5.0000 mg | DELAYED_RELEASE_TABLET | Freq: Every day | ORAL | Status: DC | PRN

## 2024-03-01 MED ORDER — FERROUS SULFATE 325 (65 FE) MG PO TABS
325.0000 mg | ORAL_TABLET | Freq: Every day | ORAL | Status: DC
  Administered 2024-03-02: 325 mg via ORAL
  Filled 2024-03-01: qty 1

## 2024-03-01 MED ORDER — SENNOSIDES-DOCUSATE SODIUM 8.6-50 MG PO TABS: 1.0000 | ORAL_TABLET | Freq: Every evening | ORAL | Status: DC | PRN

## 2024-03-01 MED ORDER — FUROSEMIDE 20 MG PO TABS
40.0000 mg | ORAL_TABLET | Freq: Every day | ORAL | Status: DC
  Administered 2024-03-02: 40 mg via ORAL
  Filled 2024-03-01: qty 2

## 2024-03-01 MED ORDER — LACTULOSE 10 GM/15ML PO SOLN
30.0000 g | Freq: Three times a day (TID) | ORAL | Status: DC
  Administered 2024-03-01 – 2024-03-02 (×4): 30 g via ORAL
  Filled 2024-03-01 (×2): qty 60
  Filled 2024-03-01: qty 45
  Filled 2024-03-01: qty 60

## 2024-03-01 MED ORDER — SODIUM CHLORIDE 0.9% FLUSH
3.0000 mL | Freq: Two times a day (BID) | INTRAVENOUS | Status: DC
  Administered 2024-03-01 – 2024-03-02 (×2): 3 mL via INTRAVENOUS

## 2024-03-01 MED ORDER — ONDANSETRON HCL 4 MG PO TABS: 4.0000 mg | ORAL_TABLET | Freq: Four times a day (QID) | ORAL | Status: DC | PRN

## 2024-03-01 MED ORDER — DULOXETINE HCL 60 MG PO CPEP
60.0000 mg | ORAL_CAPSULE | Freq: Every day | ORAL | Status: DC
  Administered 2024-03-01 – 2024-03-02 (×2): 60 mg via ORAL
  Filled 2024-03-01: qty 2
  Filled 2024-03-01: qty 1

## 2024-03-01 MED ORDER — QUETIAPINE FUMARATE 100 MG PO TABS: 100.0000 mg | ORAL_TABLET | Freq: Every day | ORAL | Status: DC

## 2024-03-01 MED ORDER — APIXABAN 5 MG PO TABS
5.0000 mg | ORAL_TABLET | Freq: Two times a day (BID) | ORAL | Status: DC
  Administered 2024-03-01 – 2024-03-02 (×3): 5 mg via ORAL
  Filled 2024-03-01 (×3): qty 1

## 2024-03-01 MED ORDER — OXYCODONE HCL 5 MG PO TABS
5.0000 mg | ORAL_TABLET | ORAL | Status: DC | PRN
  Administered 2024-03-01: 5 mg via ORAL
  Filled 2024-03-01: qty 1

## 2024-03-01 MED ORDER — ONDANSETRON HCL 4 MG/2ML IJ SOLN: 4.0000 mg | Freq: Four times a day (QID) | INTRAMUSCULAR | Status: DC | PRN

## 2024-03-01 MED ORDER — POTASSIUM CHLORIDE CRYS ER 20 MEQ PO TBCR
20.0000 meq | EXTENDED_RELEASE_TABLET | Freq: Once | ORAL | Status: AC
  Administered 2024-03-01: 20 meq via ORAL
  Filled 2024-03-01: qty 1

## 2024-03-01 MED ORDER — PANTOPRAZOLE SODIUM 40 MG PO TBEC
40.0000 mg | DELAYED_RELEASE_TABLET | Freq: Every day | ORAL | Status: DC
  Administered 2024-03-01 – 2024-03-02 (×2): 40 mg via ORAL
  Filled 2024-03-01 (×2): qty 1

## 2024-03-01 MED ORDER — HYDROMORPHONE HCL 1 MG/ML IJ SOLN: 0.5000 mg | INTRAMUSCULAR | Status: DC | PRN

## 2024-03-01 MED ORDER — MELATONIN 5 MG PO TABS
5.0000 mg | ORAL_TABLET | Freq: Every day | ORAL | Status: DC
  Administered 2024-03-01: 5 mg via ORAL
  Filled 2024-03-01: qty 1

## 2024-03-01 MED ORDER — IBUPROFEN 400 MG PO TABS: 400.0000 mg | ORAL_TABLET | Freq: Four times a day (QID) | ORAL | Status: DC | PRN

## 2024-03-01 MED ORDER — SODIUM CHLORIDE 0.9 % IV BOLUS
500.0000 mL | Freq: Once | INTRAVENOUS | Status: AC
  Administered 2024-03-01: 500 mL via INTRAVENOUS

## 2024-03-01 MED ORDER — CALCIUM GLUCONATE-NACL 1-0.675 GM/50ML-% IV SOLN
1.0000 g | Freq: Once | INTRAVENOUS | Status: AC
  Administered 2024-03-01: 1000 mg via INTRAVENOUS
  Filled 2024-03-01: qty 50

## 2024-03-01 MED ORDER — SODIUM CHLORIDE 0.9 % IV SOLN: INTRAVENOUS | Status: DC

## 2024-03-01 MED ORDER — TRAZODONE HCL 50 MG PO TABS: 25.0000 mg | ORAL_TABLET | Freq: Every evening | ORAL | Status: DC | PRN

## 2024-03-01 MED ORDER — ADULT MULTIVITAMIN W/MINERALS CH
1.0000 | ORAL_TABLET | Freq: Every day | ORAL | Status: DC
  Administered 2024-03-01 – 2024-03-02 (×2): 1 via ORAL
  Filled 2024-03-01 (×2): qty 1

## 2024-03-01 MED ORDER — METOPROLOL TARTRATE 5 MG/5ML IV SOLN: 5.0000 mg | INTRAVENOUS | Status: DC | PRN

## 2024-03-01 NOTE — ED Provider Notes (Signed)
 Patient signed out to meat 0700 by Dr. Trine pending UA and CXR with plan for admission.  In short this is an 84 year old male with a past medical history of A-fib, CHF, CKD and recent excision of a mass from his ear presenting to the emergency department with altered mental status.  Per family he has been having hallucinations at home.  Patient was tachycardic and otherwise hemodynamically stable on arrival here, initially on 3 L nasal cannula, now titrated down to 1.  He was found to be in A-fib with RVR and was given metoprolol  with improvement of his heart rate now in the 100s to 120s.  Patient's workup showed mildly increased lactic and he received 500 cc of fluid.  His ammonia was mildly elevated at 40.  Creatinine, bili and AST are approximately at baseline with increased alk phos from baseline.  On my evaluation, the patient is awake and alert resting in bed comfortably.  He is oriented x 3. Ear with some post-op swelling and bruising, but no erythema, warmth or purulent drainage to suggest infection.  8:02 AM UA and CXR without acute disease. Will be admitted for AMS.   Kingsley, Jahleah Mariscal K, DO 03/01/24 [phone]

## 2024-03-01 NOTE — Assessment & Plan Note (Addendum)
 Stents not listed, will review and continue accordingly

## 2024-03-01 NOTE — Assessment & Plan Note (Signed)
 Stable Continue Eliquis , Lopressor 

## 2024-03-01 NOTE — Hospital Course (Addendum)
 Dustin [name] is a 83-y.o Male with history of A-fib (on Eliquis ), BPH, chronic combined systolic and diastolic CHF (HFPEF 50-55%, CKD 3A, GERD, BPH, GERD, insomnia, neuropathy, Recent resection of squamous cell carcinoma of the left auricular removal Presented with a brief onset of confusion, Per family he has been having some hallucination at home.  With some palpitation,.  But denies any chest pain.  Mild shortness of breath..  No reported fever, chills, nausea or vomiting.  No signs of symptoms upper respiratory infection.  ED evaluation: Presented with A-fib with RVR, 500 metoprolol  IV was given heart rate has improved Blood pressure 138/78, pulse (!) 117, temperature 97.9 F (36.6 C),RR 19, SpO2 97%.  0.5 L [place]   LABs: VBG 7.4/43/190, potassium 3.4, 3.5, glucose 109, ionized calcium  1.01, ammonia 40, GFR 48, creatinine 1.50, hemoglobin 12.6, Lactic acid 2.2 >> 1.0 Ammonia 40, AST 140, alk phos 350 UA amber, small hemoglobin, 5 ketones, 30 protein, no bacteria, WBC 0-5 Head CT-unremarkable Recent postoperative changes to the left pinna.     Requested to be admitted for brief confusion, in setting of no identified infection, mild lactic acidosis, mildly elevated ammonia level, and transaminitis

## 2024-03-01 NOTE — Assessment & Plan Note (Signed)
 Continue PPI

## 2024-03-01 NOTE — Assessment & Plan Note (Signed)
 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

## 2024-03-01 NOTE — ED Notes (Signed)
 Patient confused and ripping off cords and monitoring device so unable to monitor through CCMD

## 2024-03-01 NOTE — ED Notes (Addendum)
 Pt states he feels okay now but that he was feeling short of breath after taking his medications. Lopressor  not given orally due to pt having coughing episode while taking medications. Still awaiting call back from provider page.

## 2024-03-01 NOTE — ED Triage Notes (Signed)
 Pt BIB [facility] from home. Pt had surgery on Friday on his ear. EMS reports it called for heavier sedation and since then having more AMS. Upon arrival EMS notes cyanosis to lips hands, EMS placed him on 4LC. and red rash on his back, legs and inner thighs. Hx Afib. Auditory and visual hallucinations reported as well .   EMS 18LAC 147/78BP 23 end tidal 109-150sHR 122 cbg

## 2024-03-01 NOTE — Assessment & Plan Note (Addendum)
 HFpEF  Echo 10/2022 LVEF 50 to 55%, no RWMA, mildly reduced RV SF, moderate RAE, no significant valvular abnormalities.  - Monitoring I's and O's, daily weights -Euvolemic, gentle IV fluid hydration, - Continue Farxiga , furosemide  -Previously hypotensive, we are holding MR/ARB

## 2024-03-01 NOTE — ED Notes (Signed)
 Patient has taken brief off. Attempted to cover patient with blanket, but patient states he is hot. Patient allowed this RN to at least place a blanket over his genitals to maintain privacy.

## 2024-03-01 NOTE — ED Provider Notes (Signed)
 " [place] EMERGENCY DEPARTMENT AT [facility] Provider Note  CSN: [id number] Arrival date & time: 03/01/24 9486  Chief Complaint(s) Altered Mental Status  History provided by patient and family member. HPI & MDM Dustin [name] is a 84 y.o. male with a past medical history listed below here for altered mental status.   Altered Mental Status  Onset was 3 days ago following surgery to excise a left auricular mass.  Family reports that patient has been having hallucinations since getting home from the hospital on Friday.  Patient has been heard talking to himself as well as his deceased wife.  Family also noted diffuse macular rash.  No reported fevers.  No nausea or vomiting.  No chest pain or shortness of breath.  No abdominal pain.  No diarrhea.  No recent medication changes other than taking oxycodone  for pain.  Patient does have history of alcohol  use disorder stating that he drinks 2-3 vodka drinks per night.  Since being discharged, he is drinking his usual amount of alcohol  except today where he only drank 1 drink.  Patient and family deny any falls or head injury.     Medical Decision Making Amount and/or Complexity of Data Reviewed Labs: ordered. Decision-making details documented in ED Course. Radiology: ordered and independent interpretation performed. Decision-making details documented in ED Course.  Risk Prescription drug management.    AMS Will assess for infectious etiology.  Considering medication/anesthesia related encephalopathy or polypharmacy.  Low suspicion but will rule out intracranial mass/ICH. Doubt alcohol  WD given his continued use. CT head negative for ICH or obvious mass effect. CBC without leukocytosis or anemia CMP with mild hypokalemia.  No other electrolyte derangements.  Mild renal insufficiency without overt AKI.  Baseline transaminitis. Lactic acid 2.2.  Likely type II lactic acidosis.  No source of infection as of yet as UA is  negative.  Awaiting chest x-ray.  Patient is afebrile.   AFRVR Unsure of onset. He is anticoagulated, but it was held for surgery. H/o CHF with last EF 50-55% - avoid CCB. Will give small IVF and metoprolol .   Patient care turned over to oncoming provider. Patient case and results discussed in detail; please see their note for further ED managment. Anticipate admission once rest of work up results.   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   Hardening of the aorta (main artery of the heart) 02/24/2024   Heart failure (HCC) 02/24/2024   Hypercholesterolemia 02/24/2024   Pulmonary hypertension (HCC) 02/24/2024   Stage 3a chronic kidney disease (HCC) 02/24/2024   Tremor due to disorder of central nervous system 02/24/2024   Thrombocytopenia 03/30/2023   Pain 03/30/2023   Femoral fracture (HCC) 03/30/2023   Acute respiratory failure due to COVID-19 (HCC) 11/13/2022   COPD (chronic obstructive pulmonary disease) (HCC) 11/13/2022   Emphysema of lung (HCC) 06/12/2022   AAA (abdominal aortic aneurysm) 06/12/2022  Abdominal pain 06/12/2022   Urinary tract infection without hematuria    Pneumonia due to infectious organism    Acute hypoxemic respiratory failure (HCC) 10/30/2021   Persistent atrial fibrillation (HCC) 08/25/2020   Left ventricular dysfunction 08/25/2020   Acute posthemorrhagic anemia 03/17/2020   Femur fracture (HCC) 03/07/2020   Hip  fracture (HCC) 03/07/2020   Cellulitis of right lower extremity    Acute metabolic encephalopathy    Unspecified fracture of right foot, initial encounter for closed fracture    Acute urinary retention    Idiopathic peripheral neuropathy    Sepsis due to cellulitis (HCC) 10/07/2019   New onset headache 01/20/2018   Muscle weakness (generalized) 11/28/2017   Weakness 11/26/2017   Right lumbar radiculopathy 09/18/2016   Right leg pain 07/22/2016   Restless leg syndrome 05/28/2016   Iron  deficiency anemia 05/28/2016   Fall 02/10/2016   Rib fracture 02/10/2016   Acute encephalopathy 02/10/2016   Chronic combined systolic (congestive) and diastolic (congestive) heart failure (HCC)    Ataxia    Hyponatremia 01/22/2016   Gait instability 01/22/2016   Urinary retention    Stroke-like symptom    Syncope 05/02/2015   Closed left fibular fracture 05/02/2015   GERD (gastroesophageal reflux disease) 05/02/2015   BPH (benign prostatic hyperplasia) 05/02/2015   Fracture of distal end of left fibula    Memory loss 01/30/2015   Depression 01/30/2015   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 Authorizing Provider  acetaminophen  (TYLENOL ) 500 MG tablet Take 2 tablets (1,000 mg total) by mouth every 6 (six) hours as needed. 02/27/24 02/26/25  Tobie Eldora NOVAK, MD  apixaban  (ELIQUIS ) 5 MG TABS tablet Take 1 tablet (5 mg total) by mouth 2 (two) times daily. 03/01/24   Patel, Kunjan B, MD  DENTA 5000 PLUS 1.1 % CREA dental cream Place 1 Application onto teeth in the morning and at bedtime. 03/06/23   [provider]  DULoxetine  (CYMBALTA ) 60 MG capsule Take 60 mg by mouth daily. 05/08/22   [provider]  FARXIGA  10 MG TABS tablet Take 10 mg by mouth daily. 10/10/21   [provider]  ferrous sulfate  325 (65 FE) MG tablet Take 1 tablet (325 mg total) by mouth daily with breakfast.  06/14/22   Lue Elsie BROCKS, MD  furosemide  (LASIX ) 40 MG tablet Take 40 mg by mouth daily. 02/13/24   [provider]  ibuprofen  (MOTRIN  IB) 200 MG tablet Take 2 tablets (400 mg total) by mouth every 6 (six) hours as needed. 02/27/24 02/26/25  Tobie Eldora NOVAK, MD  metoprolol  tartrate (LOPRESSOR ) 100 MG tablet Take 100 mg by mouth 2 (two) times daily. 04/08/23   [provider]  Multiple Vitamin (MULTIVITAMIN WITH MINERALS) TABS tablet Take 1 tablet by mouth daily. 03/18/20   Marilee Leach, MD  oxyCODONE  (ROXICODONE ) 5 MG immediate release tablet Take 1 tablet (5 mg total) by mouth every 6 (six) hours as needed for up to 7 days. 02/27/24 03/05/24  Tobie Eldora NOVAK, MD  pantoprazole  (PROTONIX ) 40 MG tablet Take 1 tablet (40 mg total) by mouth daily. 03/18/20   Marilee Leach, MD  QUEtiapine  (SEROQUEL ) 300 MG tablet Take 1 tablet (300 mg total) by mouth at bedtime. Patient taking differently: Take 600 mg by mouth at bedtime. 04/08/23   Raenelle Coria, MD  rOPINIRole  (REQUIP ) 1 MG tablet Take 2 mg by mouth at bedtime.    [provider]  sennosides-docusate sodium  (SENOKOT-S) 8.6-50 MG tablet Take 2 tablets by mouth 2 (two) times daily.    [provider]  silodosin (RAPAFLO) 8 MG CAPS capsule Take 8 mg by mouth daily. 03/18/22   [provider]  Vitamin D , Ergocalciferol , (DRISDOL ) 1.25 MG (50000 UNIT) CAPS capsule Take 50,000 Units by mouth once a week. Thursdays 09/09/21   [provider]                                                                                                                                    Allergies Desmopressin acetate and Zoloft  [sertraline ]  Review of Systems Review of Systems As noted in HPI  Physical Exam Vital Signs  I have reviewed the triage vital signs BP (!) 143/123   Pulse 69   Temp (!) 97.5 F (36.4 C) (Oral)   Resp (!) 21   Ht 6' 2 (1.88 m)   Wt 107 kg   SpO2 98%   BMI 30.29 kg/m   Physical  Exam Vitals reviewed.  Constitutional:      General: He is not in acute distress.    Appearance: He is well-developed. He is not diaphoretic.  HENT:     Head: Normocephalic and atraumatic.     Nose: Nose normal.     Mouth/Throat:     Mouth: No oral lesions.  Eyes:     General: No scleral icterus.       Right eye: No discharge.        Left eye: No discharge.     Conjunctiva/sclera: Conjunctivae normal.     Right eye: Right conjunctiva is not injected.     Left eye: Left conjunctiva is not injected.     Pupils: Pupils are equal, round, and reactive to light.  Cardiovascular:     Rate and Rhythm: Tachycardia present. Rhythm irregularly irregular.     Heart sounds: No murmur heard.    No friction rub. No gallop.  Pulmonary:     Effort: Pulmonary effort is normal. No respiratory distress.     Breath sounds: Normal breath sounds. No stridor. No rales.  Abdominal:     General: There is no distension.     Palpations: Abdomen is soft.     Tenderness: There is no abdominal tenderness.  Musculoskeletal:        General: No tenderness.     Cervical back: Normal range of motion and neck supple.     Right lower leg: 1+ Pitting Edema present.     Left lower leg: 1+ Pitting Edema present.  Skin:    General: Skin is warm and dry.     Findings: Rash present. No erythema. Rash is macular (on face, torso (mostly back), and lower extremities).  Neurological:     Mental Status: He is alert and oriented to person, place, and time.     Sensory: Sensation is intact.     Motor:  Motor function is intact.     ED Results and Treatments Labs (all labs ordered are listed, but only abnormal results are displayed) Labs Reviewed  COMPREHENSIVE METABOLIC PANEL WITH GFR - Abnormal; Notable for the following components:      Result Value   Potassium 3.4 (*)    Chloride 97 (*)    Glucose, Bld 108 (*)    Creatinine, Ser 1.46 (*)    AST 140 (*)    Alkaline Phosphatase 315 (*)    Total Bilirubin 2.3  (*)    GFR, Estimated 48 (*)    All other components within normal limits  CBC WITH DIFFERENTIAL/PLATELET - Abnormal; Notable for the following components:   RBC 3.68 (*)    Hemoglobin 12.3 (*)    HCT 36.8 (*)    nRBC 0.5 (*)    All other components within normal limits  PROTIME-INR - Abnormal; Notable for the following components:   Prothrombin Time 16.1 (*)    All other components within normal limits  URINALYSIS, W/ REFLEX TO CULTURE (INFECTION SUSPECTED) - Abnormal; Notable for the following components:   Color, Urine AMBER (*)    Hgb urine dipstick SMALL (*)    Ketones, ur 5 (*)    Protein, ur 30 (*)    Non Squamous Epithelial 0-5 (*)    All other components within normal limits  AMMONIA - Abnormal; Notable for the following components:   Ammonia 40 (*)    All other components within normal limits  I-STAT CG4 LACTIC ACID, ED - Abnormal; Notable for the following components:   Lactic Acid, Venous 2.2 (*)    All other components within normal limits  CBG MONITORING, ED - Abnormal; Notable for the following components:   Glucose-Capillary 121 (*)    All other components within normal limits  I-STAT CHEM 8, ED - Abnormal; Notable for the following components:   Potassium 3.4 (*)    Chloride 97 (*)    Creatinine, Ser 1.50 (*)    Glucose, Bld 109 (*)    Calcium , Ion 1.01 (*)    Hemoglobin 12.6 (*)    HCT 37.0 (*)    All other components within normal limits  I-STAT VENOUS BLOOD GAS, ED - Abnormal; Notable for the following components:   pCO2, Ven 43.0 (*)    pO2, Ven 190 (*)    Acid-Base Excess 3.0 (*)    Calcium , Ion 1.01 (*)    HCT 37.0 (*)    Hemoglobin 12.6 (*)    All other components within normal limits  CULTURE, BLOOD (ROUTINE X 2)  CULTURE, BLOOD (ROUTINE X 2)  ETHANOL  I-STAT CG4 LACTIC ACID, ED                                                                                                                         EKG  EKG Interpretation Date/Time:  Monday  March 01 2024 05:21:03 EST Ventricular Rate:  141 PR Interval:  QRS Duration:  86 QT Interval:  323 QTC Calculation: 495 R Axis:   -13  Text Interpretation: Atrial fibrillation with rapid V-rate Ventricular premature complex Repolarization abnormality, prob rate related Confirmed by Trine Likes [phone]) on 03/01/2024 6:06:57 AM       Radiology CT HEAD WO CONTRAST Result Date: 03/01/2024 EXAM: CT HEAD WITHOUT CONTRAST 03/01/2024 06:03:00 AM TECHNIQUE: CT of the head was performed without the administration of intravenous contrast. Automated exposure control, iterative reconstruction, and/or weight based adjustment of the mA/kV was utilized to reduce the radiation dose to as low as reasonably achievable. COMPARISON: Head CT 06/11/2022, Brain MRI 10/30/2021. CLINICAL HISTORY: 84 year old male. Mental status change, unknown cause. Recent ear surgery. FINDINGS: BRAIN AND VENTRICLES: Brain volume remains normal for age. No acute hemorrhage. No evidence of acute infarct. No hydrocephalus. No extra-axial collection. No mass effect or midline shift. Stable minimal for age periventricular white matter hypodensity. Gray-white differentiation otherwise normal. No suspicious intracranial vascular hyperdensity. Mild for age calcified atherosclerosis at the skull base. ORBITS: No acute abnormality. SINUSES: Paranasal sinuses are clear. Tympanic cavities and mastoids are clear. SOFT TISSUES AND SKULL: Asymmetric swelling of the left pinna with overlying dressing material. No soft tissue gas. No skull fracture. IMPRESSION: 1. Negative for age non-contrast CT appearance of the brain. 2. Recent postoperative changes to the left pinna. Electronically signed by: Helayne Hurst MD MD 03/01/2024 06:17 AM EST RP Workstation: [id number]    Medications Ordered in ED Medications  metoprolol  tartrate (LOPRESSOR ) injection 5 mg (5 mg Intravenous Given 03/01/24 0646)  sodium chloride  0.9 % bolus 500 mL (500 mLs Intravenous  New Bag/Given 03/01/24 0639)   Procedures .Critical Care  Performed by: Trine Likes Moder, MD Authorized by: Trine Likes Moder, MD   Critical care provider statement:    Critical care time (minutes):  49   Critical care time was exclusive of:  Separately billable procedures and treating other patients   Critical care was necessary to treat or prevent imminent or life-threatening deterioration of the following conditions:  CNS failure or compromise and cardiac failure   Critical care was time spent personally by me on the following activities:  Development of treatment plan with patient or surrogate, discussions with consultants, evaluation of patient's response to treatment, examination of patient, obtaining history from patient or surrogate, review of old charts, re-evaluation of patient's condition, pulse oximetry, ordering and review of radiographic studies, ordering and review of laboratory studies and ordering and performing treatments and interventions   Care discussed with: admitting provider   .1-3 Lead EKG Interpretation  Performed by: Trine Likes Moder, MD Authorized by: Trine Likes Moder, MD     Interpretation: abnormal     ECG rate:  137   ECG rate assessment: tachycardic     Rhythm: atrial fibrillation     Ectopy: none     Conduction: normal     (including critical care time)   This chart was dictated using voice recognition software.  Despite best efforts to proofread,  errors can occur which can change the documentation meaning.   Trine Likes Moder, MD 03/01/24 [phone]  "

## 2024-03-01 NOTE — ED Notes (Signed)
 Pt talking to himself, pulling at hospital gown & not settled on treatment stretcher. Pt denies any pain or needs at this time. Pt asked what time it was & if it was am or pm.

## 2024-03-01 NOTE — Assessment & Plan Note (Addendum)
 Consulting PT OT, fall precaution - History of multiple falls, hip fractures, Was hospitalized on 2/25 post fall, closed traumatic injuries trochanteric fracture and ORIF

## 2024-03-01 NOTE — H&P (Addendum)
 " History and Physical   Patient: Dustin [name]                            PCP: Regino Slater, MD                    DOB: July 30, 1940            DOA: 03/01/2024 FMW:[id number]             DOS: 03/01/2024, 2:59 PM  Koirala, Dibas, MD  Patient coming from:   HOME  I have personally reviewed patient's medical records, in electronic medical records, including:  [place] link, and care everywhere.    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  With some palpitation,.  But denies any chest pain.  Mild shortness of breath..  No reported fever, chills, nausea or vomiting.  No signs of symptoms upper respiratory infection.  ED evaluation: Presented with A-fib with RVR, 500 metoprolol  IV was given heart rate has improved Blood pressure 138/78, pulse (!) 117, temperature 97.9 F (36.6 C),RR 19, SpO2 97%.  0.5 L [place]   LABs: VBG 7.4/43/190, potassium 3.4, 3.5, glucose 109, ionized calcium  1.01, ammonia 40, GFR 48, creatinine 1.50, hemoglobin 12.6, Lactic acid 2.2 >> 1.0 Ammonia 40, AST 140, alk phos 350 UA amber, small hemoglobin, 5 ketones, 30 protein, no bacteria, WBC 0-5 Head CT-unremarkable Recent postoperative changes to the left pinna.     Requested to be admitted for brief confusion, in setting of no identified infection, mild lactic acidosis, mildly elevated ammonia level, and transaminitis    Patient Denies having: Fever, Chills, Cough, SOB, Chest Pain, Abd pain, N/V/D, headache, dizziness, lightheadedness,  Dysuria, Joint pain, rash, open  wounds   Review of Systems: As per HPI, otherwise 10 point review of systems were negative.   ----------------------------------------------------------------------------------------------------------------------  Allergies[1]  Home MEDs:  Prior to Admission medications  Medication Sig Start Date End Date Taking? Authorizing Provider  acetaminophen  (TYLENOL ) 500 MG tablet Take 2 tablets (1,000 mg total) by mouth every 6 (six) hours as needed. 02/27/24 02/26/25  Tobie Eldora NOVAK, MD  apixaban  (ELIQUIS ) 5 MG TABS tablet Take 1 tablet (5 mg total) by mouth 2 (two) times daily. 03/01/24   Patel, Kunjan B, MD  DENTA 5000 PLUS 1.1 % CREA dental cream Place 1 Application onto teeth in the morning and at bedtime. 03/06/23   [provider]  DULoxetine  (CYMBALTA ) 60 MG capsule Take 60 mg by mouth daily. 05/08/22   [provider]  FARXIGA  10 MG TABS tablet Take 10 mg by mouth daily. 10/10/21   [provider]  ferrous sulfate  325 (65 FE) MG tablet Take 1 tablet (325 mg total) by mouth daily with breakfast. 06/14/22   Lue Elsie BROCKS, MD  furosemide  (LASIX ) 40 MG tablet Take 40 mg by mouth daily. 02/13/24   [provider]  ibuprofen  (MOTRIN  IB) 200 MG tablet Take 2 tablets (400 mg total) by mouth every 6 (six) hours as needed. 02/27/24  02/26/25  Tobie Eldora NOVAK, MD  metoprolol  tartrate (LOPRESSOR ) 100 MG tablet Take 100 mg by mouth 2 (two) times daily. 04/08/23   [provider]  Multiple Vitamin (MULTIVITAMIN WITH MINERALS) TABS tablet Take 1 tablet by mouth daily. 03/18/20   Marilee Leach, MD  oxyCODONE  (ROXICODONE ) 5 MG immediate release tablet Take 1 tablet (5 mg total) by mouth every 6 (six) hours as needed for up to 7 days. 02/27/24 03/05/24  Tobie Eldora NOVAK, MD  pantoprazole  (PROTONIX ) 40 MG tablet Take 1 tablet (40 mg total) by mouth daily. 03/18/20   Marilee Leach, MD  QUEtiapine  (SEROQUEL ) 300 MG tablet Take 1 tablet (300 mg total) by mouth at  bedtime. Patient taking differently: Take 600 mg by mouth at bedtime. 04/08/23   Raenelle Coria, MD  rOPINIRole  (REQUIP ) 1 MG tablet Take 2 mg by mouth at bedtime.    [provider]  sennosides-docusate sodium  (SENOKOT-S) 8.6-50 MG tablet Take 2 tablets by mouth 2 (two) times daily.    [provider]  silodosin (RAPAFLO) 8 MG CAPS capsule Take 8 mg by mouth daily. 03/18/22   [provider]  Vitamin D , Ergocalciferol , (DRISDOL ) 1.25 MG (50000 UNIT) CAPS capsule Take 50,000 Units by mouth once a week. Thursdays 09/09/21   [provider]    PRN MEDs: hydrALAZINE , HYDROmorphone  (DILAUDID ) injection, ipratropium, metoprolol  tartrate, ondansetron  **OR** ondansetron  (ZOFRAN ) IV, oxyCODONE , senna-docusate  Past Medical History:  Diagnosis Date   Atrial fibrillation (HCC)    BPH (benign prostatic hyperplasia)    Chronic combined systolic and diastolic congestive heart failure (HCC)    CKD (chronic kidney disease)    GERD (gastroesophageal reflux disease)    protonix  for control   Insomnia    Neck pain    Neuropathy     Past Surgical History:  Procedure Laterality Date   ANTERIOR CERVICAL DECOMPRESSION/DISCECTOMY FUSION 4 LEVELS N/A 10/27/2012   Procedure: Cervical Three-Four Cervical Four-Five Cervical Five-Six Cervical Six-Seven  Anterior cervical decompression/diskectomy/fusion;  Surgeon: Fairy Levels, MD;  Location: [facility] NEURO ORS;  Service: Neurosurgery;  Laterality: N/A;  Cervical Three-Four Cervical Four-Five Cervical Five-Six Cervical Six-Seven  Anterior cervical decompression/diskectomy/fusion   CARDIOVERSION N/A 09/20/2020   Procedure: CARDIOVERSION;  Surgeon: Kate Lonni CROME, MD;  Location: [facility] ENDOSCOPY;  Service: Cardiovascular;  Laterality: N/A;   COMPLEX WOUND CLOSURE Left 02/27/2024   Procedure: COMPLEX CLOSURE, WOUND;  Surgeon: Tobie Eldora NOVAK, MD;  Location: [facility] OR;  Service: ENT;  Laterality: Left;   EXCISION MASS HEAD Left 02/27/2024    Procedure: EXCISION, MASS, HEAD;  Surgeon: Tobie Eldora NOVAK, MD;  Location: [facility] OR;  Service: ENT;  Laterality: Left;  Auricular lesion excision , Complex lesion closure   FEMUR IM NAIL Right 03/08/2020   Procedure: INTRAMEDULLARY (IM) RETROGRADE FEMORAL NAILING;  Surgeon: Kendal Franky SQUIBB, MD;  Location: [facility] OR;  Service: Orthopedics;  Laterality: Right;   HARDWARE REMOVAL Right 03/31/2023   Procedure: HARDWARE REMOVAL;  Surgeon: Kendal Franky SQUIBB, MD;  Location: [facility] OR;  Service: Orthopedics;  Laterality: Right;   INTRAMEDULLARY (IM) NAIL INTERTROCHANTERIC Right 03/31/2023   Procedure: INTRAMEDULLARY (IM) NAIL INTERTROCHANTERIC;  Surgeon: Kendal Franky SQUIBB, MD;  Location: [facility] OR;  Service: Orthopedics;  Laterality: Right;   SHOULDER ARTHROSCOPY  2010   rt shoulder   SHOULDER ARTHROSCOPY  02/15/2011   Procedure: ARTHROSCOPY SHOULDER;  Surgeon: Dustin Prentice Collet;  Location: [facility];  Service: Orthopedics;  Laterality: Right;  Debridement   TONSILLECTOMY       reports  that he quit smoking about 34 years ago. His smoking use included cigarettes. He smoked an average of 1.5 packs per day. He has never used smokeless tobacco. He reports current alcohol  use of about 3.0 standard drinks of alcohol  per week. He reports that he does not use drugs.   Family History  Problem Relation Age of Onset   Emphysema Mother    Cirrhosis Father    Stroke Brother     Physical Exam:   Vitals:   03/01/24 0733 03/01/24 0812 03/01/24 0815 03/01/24 1305  BP: (!) 87/68 129/84 138/78 125/86  Pulse: (!) 47 (!) 118 (!) 117 91  Resp: (!) 22 18 19 19   Temp:  97.9 F (36.6 C)  97.8 F (36.6 C)  TempSrc:  Oral  Oral  SpO2: 95% 96% 97% 94%  Weight:      Height:       Constitutional: NAD, calm, comfortable Eyes: PERRL, lids and conjunctivae normal ENMT: Mucous membranes are moist. Posterior pharynx clear of any exudate or lesions.Normal dentition.  Neck: normal, supple, no masses, no  thyromegaly Respiratory: clear to auscultation bilaterally, no wheezing, no crackles. Normal respiratory effort. No accessory muscle use.  Cardiovascular: Regular rate and rhythm, no murmurs / rubs / gallops. No extremity edema. 2+ pedal pulses. No carotid bruits.  Abdomen: no tenderness, no masses palpated. No hepatosplenomegaly. Bowel sounds positive.  Musculoskeletal: no clubbing / cyanosis. No joint deformity upper and lower extremities. Good ROM, no contractures. Normal muscle tone.  Neurologic: CN II-XII grossly intact. Sensation intact, DTR normal. Strength 5/5 in all 4.  Psychiatric: Normal judgment and insight. Alert and oriented x 3. Normal mood.  Skin: no rashes, lesions, ulcers. No induration Decubitus/ulcers:  Wounds: per nursing documentation         Labs on admission:    I have personally reviewed following labs and imaging studies  CBC: Recent Labs  Lab 03/01/24 0528 03/01/24 0555 03/01/24 0556  WBC 6.4  --   --   NEUTROABS 4.4  --   --   HGB 12.3* 12.6* 12.6*  HCT 36.8* 37.0* 37.0*  MCV 100.0  --   --   PLT PLATELET CLUMPS NOTED ON SMEAR, UNABLE TO ESTIMATE  --   --    Basic Metabolic Panel: Recent Labs  Lab 03/01/24 0528 03/01/24 0555 03/01/24 0556  NA 138 136 136  K 3.4* 3.4* 3.5  CL 97* 97*  --   CO2 27  --   --   GLUCOSE 108* 109*  --   BUN 18 20  --   CREATININE 1.46* 1.50*  --   CALCIUM  9.0  --   --    GFR: Estimated Creatinine Clearance: 49.5 mL/min (A) (by C-G formula based on SCr of 1.5 mg/dL (H)). Liver Function Tests: Recent Labs  Lab 03/01/24 0528  AST 140*  ALT 40  ALKPHOS 315*  BILITOT 2.3*  PROT 7.2  ALBUMIN  4.3   No results for input(s): LIPASE, AMYLASE in the last 168 hours. Recent Labs  Lab 03/01/24 0620  AMMONIA 40*   Coagulation Profile: Recent Labs  Lab 03/01/24 0528  INR 1.2    Recent Labs  Lab 03/01/24 0621  GLUCAP 121*    Urine analysis:    Component Value Date/Time   COLORURINE AMBER (A)  03/01/2024 0639   APPEARANCEUR CLEAR 03/01/2024 0639   LABSPEC 1.018 03/01/2024 0639   PHURINE 5.0 03/01/2024 0639   GLUCOSEU NEGATIVE 03/01/2024 0639   HGBUR SMALL (A) 03/01/2024 9360  BILIRUBINUR NEGATIVE 03/01/2024 0639   KETONESUR 5 (A) 03/01/2024 0639   PROTEINUR 30 (A) 03/01/2024 0639   NITRITE NEGATIVE 03/01/2024 0639   LEUKOCYTESUR NEGATIVE 03/01/2024 0639    Last A1C:  Lab Results  Component Value Date   HGBA1C 5.5 01/23/2016     Radiologic Exams on Admission:   DG Chest Port 1 View Result Date: 03/01/2024 EXAM: 1 VIEW(S) XRAY OF THE CHEST 03/01/2024 07:44:00 AM COMPARISON: 04/02/2023 CLINICAL HISTORY: Tachycardia FINDINGS: LUNGS AND PLEURA: No focal pulmonary opacity. No pleural effusion. No pneumothorax. HEART AND MEDIASTINUM: Mild cardiomegaly. BONES AND SOFT TISSUES: Partially visualized ACDF surgical hardware in lower cervical spine. IMPRESSION: 1. No acute findings. 2. Mild cardiomegaly. Electronically signed by: Waddell Calk MD MD 03/01/2024 07:55 AM EST RP Workstation: [id number]   CT HEAD WO CONTRAST Result Date: 03/01/2024 EXAM: CT HEAD WITHOUT CONTRAST 03/01/2024 06:03:00 AM TECHNIQUE: CT of the head was performed without the administration of intravenous contrast. Automated exposure control, iterative reconstruction, and/or weight based adjustment of the mA/kV was utilized to reduce the radiation dose to as low as reasonably achievable. COMPARISON: Head CT 06/11/2022, Brain MRI 10/30/2021. CLINICAL HISTORY: 84 year old male. Mental status change, unknown cause. Recent ear surgery. FINDINGS: BRAIN AND VENTRICLES: Brain volume remains normal for age. No acute hemorrhage. No evidence of acute infarct. No hydrocephalus. No extra-axial collection. No mass effect or midline shift. Stable minimal for age periventricular white matter hypodensity. Gray-white differentiation otherwise normal. No suspicious intracranial vascular hyperdensity. Mild for age calcified  atherosclerosis at the skull base. ORBITS: No acute abnormality. SINUSES: Paranasal sinuses are clear. Tympanic cavities and mastoids are clear. SOFT TISSUES AND SKULL: Asymmetric swelling of the left pinna with overlying dressing material. No soft tissue gas. No skull fracture. IMPRESSION: 1. Negative for age non-contrast CT appearance of the brain. 2. Recent postoperative changes to the left pinna. Electronically signed by: Helayne Hurst MD MD 03/01/2024 06:17 AM EST RP Workstation: [id number]    EKG:   Independently reviewed.  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   Transaminitis   Depression   GERD (gastroesophageal reflux disease)   BPH (benign prostatic hyperplasia)   Gait instability   Chronic combined systolic (congestive) and diastolic (congestive) heart failure (HCC)   Persistent atrial fibrillation (HCC)   Hypercholesterolemia   Stage 3a chronic kidney disease (HCC)   AAA (abdominal aortic aneurysm)   Altered mental status   Assessment and Plan: * Acute encephalopathy Transient confusion, CT of the head negative -Continue with neurochecks  -Polypharmacy - reviewing home medications as a culprit (confirming medication,-self-massage medication has been taken incorrectly double dose including Seroquel , incorrectly taking oxycodone , and his Eliquis )  -Ruling out infection  - Confusion in setting of mildly elevated ammonia level 40, transaminitis -Initiating lactulose  -Gentle IV fluid hydration -Continue with neurochecks -Obtaining hepatitis panel  Transaminitis     Latest Ref Rng & Units 03/01/2024    5:28 AM 04/06/2023    4:12 AM 04/05/2023    5:21 AM  Hepatic Function  Total Protein 6.5 - 8.1 g/dL 7.2  6.0  5.9   Albumin  3.5 - 5.0 g/dL 4.3  2.9  2.8   AST 15 -  41 U/L 140  117  212   ALT 0 - 44 U/L 40  99  125   Alk Phosphatase 38 - 126 U/L 315  99  83   Total Bilirubin 0.0 - 1.2 mg/dL 2.3  2.0  1.5    Ammonia level 40 - Obtaining hepatitis panel -Avoiding hepatotoxic -IV fluids -Initiating lactulose  -Monitoring LFTs  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 S/p excision of malignant lesion of the left ear 4.5 cm Healing well no signs of infection  COPD (chronic obstructive pulmonary disease) (HCC) Stable mild respite distress, no overt signs of exacerbation currently requiring 0.5 L of oxygen, satting > 94% - wean off supplemental oxygen -DuoNeb bronchodilators, as needed  Stage 3a chronic kidney disease (HCC) BUN/creatinine close to baseline -Monitor closely, avoid nephrotoxin, avoiding hypotension BUN 18, 20, GFR 48 Lab Results  Component Value Date   CREATININE 1.50 (H) 03/01/2024   CREATININE 1.46 (H) 03/01/2024   CREATININE 1.39 (H) 04/06/2023     Hypercholesterolemia Stents not listed, will review and continue accordingly  Persistent atrial fibrillation (HCC) Stable Continue Eliquis , Lopressor   Chronic combined systolic (congestive) and diastolic (congestive) heart failure (HCC) HFpEF  Echo 10/2022 LVEF 50 to 55%, no RWMA, mildly reduced RV SF, moderate RAE, no significant valvular abnormalities.  - Monitoring I's and O's, daily weights -Euvolemic, gentle IV fluid hydration, - Continue Farxiga , furosemide  -Previously hypotensive, we are holding MR/ARB  Gait instability Consulting PT OT, fall precaution - History of multiple falls, hip fractures, Was hospitalized on 2/25 post fall, closed traumatic injuries trochanteric fracture and ORIF  BPH (benign prostatic hyperplasia) Continue silodosin  GERD  (gastroesophageal reflux disease) Continue PPI  Depression Continue Cymbalta               Consults called: PT/OT/social work -------------------------------------------------------------------------------------------------------------------------------------------- DVT prophylaxis:  SCDs Start: 03/01/24 0916 apixaban  (ELIQUIS ) tablet 5 mg   Code Status:   Code Status: Full Code   Admission status: Patient will be admitted as Observation, with a greater than 2 midnight length of stay. Level of care: Telemetry   Family Communication:  none at bedside  (The above findings and plan of care has been discussed with patient in detail, the patient expressed understanding and agreement of above plan)  --------------------------------------------------------------------------------------------------------------------------------------------------  Disposition Plan:  Anticipated 1-2 days Status is: Observation The patient remains OBS appropriate and will d/c before 2 midnights.     ----------------------------------------------------------------------------------------------------------------------------------------------------  Time spent:  83  Min.  Was spent seeing and evaluating the patient, reviewing all medical records, drawn plan of care.  SIGNED: Adriana DELENA Grams, MD, FHM. FAAFP. [place] [facility], Pager  (Please use [email or web address] to page/ or secure chat through epic) If 7PM-7AM, please contact night-coverage [email or web address],  03/01/2024, 2:59 PM     [1]  Allergies Allergen Reactions   Desmopressin Acetate Other (See Comments)    Hyponatremia    Zoloft  [Sertraline ] Other (See Comments)    Felt bad - Patient denies   "

## 2024-03-01 NOTE — ED Notes (Signed)
 Changed pts. Brief and linens

## 2024-03-01 NOTE — Assessment & Plan Note (Signed)
 BUN/creatinine close to baseline -Monitor closely, avoid nephrotoxin, avoiding hypotension BUN 18, 20, GFR 48 Lab Results  Component Value Date   CREATININE 1.50 (H) 03/01/2024   CREATININE 1.46 (H) 03/01/2024   CREATININE 1.39 (H) 04/06/2023

## 2024-03-01 NOTE — Assessment & Plan Note (Signed)
" °    Latest Ref Rng & Units 03/01/2024    5:28 AM 04/06/2023    4:12 AM 04/05/2023    5:21 AM  Hepatic Function  Total Protein 6.5 - 8.1 g/dL 7.2  6.0  5.9   Albumin  3.5 - 5.0 g/dL 4.3  2.9  2.8   AST 15 - 41 U/L 140  117  212   ALT 0 - 44 U/L 40  99  125   Alk Phosphatase 38 - 126 U/L 315  99  83   Total Bilirubin 0.0 - 1.2 mg/dL 2.3  2.0  1.5    Ammonia level 40 -Obtaining otitis panel -Avoiding hepatotoxic -IV fluids -Initiating lactulose  -Monitoring LFTs "

## 2024-03-01 NOTE — ED Notes (Signed)
 CCMD notified of need to monitor pt.

## 2024-03-01 NOTE — Assessment & Plan Note (Signed)
 Stable mild respite distress, no overt signs of exacerbation currently requiring 0.5 L of oxygen, satting > 94% - wean off supplemental oxygen -DuoNeb bronchodilators, as needed

## 2024-03-01 NOTE — Progress Notes (Signed)
 Patient appeared to aspirate while taking medications. He does not appear to be in distress but is now requiring 2 Lpm supplemental O2. Plan to keep him NPO for now and consult SLP.

## 2024-03-01 NOTE — Assessment & Plan Note (Signed)
 Transient confusion, CT of the head negative -Continue with neurochecks -Reviewing home medications as a culprit -Ruling out infection  - Confusion in setting of mildly elevated ammonia level 40, transaminitis -Initiating lactulose  -Gentle IV fluid hydration -Continue with neurochecks -Obtaining hepatitis panel

## 2024-03-01 NOTE — Assessment & Plan Note (Signed)
Continue silodosin

## 2024-03-01 NOTE — ED Notes (Signed)
 Pt had a large coughing spell after taking medications, oxygen saturation decreased from 96% room air to 94% room air. Pt said he felt like he got strangled. Provider paged.

## 2024-03-01 NOTE — Assessment & Plan Note (Signed)
"  Continue Cymbalta .   "

## 2024-03-01 NOTE — ED Notes (Signed)
 Pt's oxygen saturation decreased to 87% on room air. Pt placed on 2L [place] increasing oxygen sat to 93%

## 2024-03-02 DIAGNOSIS — G9341 Metabolic encephalopathy: Secondary | ICD-10-CM | POA: Diagnosis not present

## 2024-03-02 LAB — COMPREHENSIVE METABOLIC PANEL WITH GFR
ALT: 39 U/L (ref 0–44)
AST: 114 U/L — ABNORMAL HIGH (ref 15–41)
Albumin: 3.8 g/dL (ref 3.5–5.0)
Alkaline Phosphatase: 290 U/L — ABNORMAL HIGH (ref 38–126)
Anion gap: 11 (ref 5–15)
BUN: 17 mg/dL (ref 8–23)
CO2: 27 mmol/L (ref 22–32)
Calcium: 8.8 mg/dL — ABNORMAL LOW (ref 8.9–10.3)
Chloride: 102 mmol/L (ref 98–111)
Creatinine, Ser: 1.43 mg/dL — ABNORMAL HIGH (ref 0.61–1.24)
GFR, Estimated: 49 mL/min — ABNORMAL LOW
Glucose, Bld: 99 mg/dL (ref 70–99)
Potassium: 3.6 mmol/L (ref 3.5–5.1)
Sodium: 139 mmol/L (ref 135–145)
Total Bilirubin: 1.9 mg/dL — ABNORMAL HIGH (ref 0.0–1.2)
Total Protein: 6.5 g/dL (ref 6.5–8.1)

## 2024-03-02 LAB — PROTIME-INR
INR: 1.5 — ABNORMAL HIGH (ref 0.8–1.2)
Prothrombin Time: 18.5 s — ABNORMAL HIGH (ref 11.4–15.2)

## 2024-03-02 LAB — CBC
HCT: 36.3 % — ABNORMAL LOW (ref 39.0–52.0)
Hemoglobin: 11.9 g/dL — ABNORMAL LOW (ref 13.0–17.0)
MCH: 33.5 pg (ref 26.0–34.0)
MCHC: 32.8 g/dL (ref 30.0–36.0)
MCV: 102.3 fL — ABNORMAL HIGH (ref 80.0–100.0)
Platelets: 102 K/uL — ABNORMAL LOW (ref 150–400)
RBC: 3.55 MIL/uL — ABNORMAL LOW (ref 4.22–5.81)
RDW: 14.4 % (ref 11.5–15.5)
WBC: 6.8 K/uL (ref 4.0–10.5)
nRBC: 0 % (ref 0.0–0.2)

## 2024-03-02 LAB — PHOSPHORUS: Phosphorus: 2.2 mg/dL — ABNORMAL LOW (ref 2.5–4.6)

## 2024-03-02 LAB — HEPATITIS PANEL, ACUTE
HCV Ab: NONREACTIVE
Hep A IgM: NONREACTIVE
Hep B C IgM: NONREACTIVE
Hepatitis B Surface Ag: NONREACTIVE

## 2024-03-02 LAB — APTT: aPTT: 60 s — ABNORMAL HIGH (ref 24–36)

## 2024-03-02 LAB — AMMONIA: Ammonia: 22 umol/L (ref 9–35)

## 2024-03-02 LAB — SURGICAL PATHOLOGY

## 2024-03-02 LAB — MAGNESIUM: Magnesium: 1.6 mg/dL — ABNORMAL LOW (ref 1.7–2.4)

## 2024-03-02 LAB — PRO BRAIN NATRIURETIC PEPTIDE: Pro Brain Natriuretic Peptide: 2864 pg/mL — ABNORMAL HIGH

## 2024-03-02 MED ORDER — APIXABAN 5 MG PO TABS: 5.0000 mg | ORAL_TABLET | Freq: Two times a day (BID) | ORAL | Status: AC

## 2024-03-02 MED ORDER — ACETAMINOPHEN 325 MG PO TABS
ORAL_TABLET | ORAL | Status: AC
  Administered 2024-03-02: 650 mg via ORAL
  Filled 2024-03-02: qty 2

## 2024-03-02 MED ORDER — ACETAMINOPHEN 325 MG PO TABS: 650.0000 mg | ORAL_TABLET | Freq: Once | ORAL | Status: AC

## 2024-03-02 MED ORDER — GABAPENTIN 300 MG PO CAPS: 300.0000 mg | ORAL_CAPSULE | Freq: Three times a day (TID) | ORAL | Status: AC

## 2024-03-02 NOTE — Progress Notes (Signed)
 Transition of Care [facility]) - Inpatient Brief Assessment   Patient Details  Name: Dustin [name] MRN: [id number] Date of Birth: 17-Jan-1941  Transition of Care [facility] CM/SW Contact:    Debarah Saunas, RN Phone Number: 03/02/2024, 8:12 AM   Clinical Narrative: Inpatient Care Management (ICM) has reviewed patient at bedside and no ICM needs have been identified at this time. We will continue to monitor patient advancement through interdisciplinary progression rounds. If new patient transition needs arise, please place a ICM consult.    Transition of Care Asessment: Insurance and Status: Insurance coverage has been reviewed Patient has primary care physician: Yes (Koirala, Dibas, MD) Home environment has been reviewed: from homw with son, granddaughter and great grand Prior level of function:: HOH, independent, uses walker and cane Prior/Current Home Services: No current home services Social Drivers of Health Review: SDOH reviewed no interventions necessary Readmission risk has been reviewed: No Transition of care needs: no transition of care needs at this time

## 2024-03-02 NOTE — Discharge Summary (Signed)
 " Physician Discharge Summary   Dustin [name] FMW:[id number] DOB: [birth year] DOA: 03/01/2024  PCP: Regino Slater, MD  Admit date: 03/01/2024 Discharge date: 03/02/2024  Admitted From: Home Disposition:  Home Discharging physician: Alm Apo, MD Barriers to discharge: n/a  Recommendations at discharge: Repeat LFTs at follow up    Discharge Condition: stable CODE STATUS: Full  Diet recommendation:  Diet Orders (From admission, onward)     Start     Ordered   03/02/24 0857  Diet regular Room service appropriate? Yes; Fluid consistency: Thin  Diet effective now       Question Answer Comment  Room service appropriate? 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  Presented with a brief onset of confusion. Per family he has been having some hallucination at home.  With some palpitations, but denies any chest pain.  Mild shortness of breath..  No reported fever, chills, nausea or vomiting.  No signs of symptoms upper respiratory infection. With further collateral information the following morning, patient had been likely taking too much oxycodone  after the recent ear surgery.  Other lab abnormalities included mildly elevated ammonia level which improved after dose of lactulose  and also elevated LFTs on admission which also improved with fluids. Despite elevated proBNP, no significant signs of volume overload otherwise. Mentation improved back to normal by the following morning. He also was able to ambulate on room air with no desaturation and was liberated from oxygen.  He was considered stable for discharging home and recommended to  discontinue further use of oxycodone  as well.   The patient's acute and chronic medical conditions were treated accordingly. On day of discharge, patient was felt deemed stable for discharge. Patient/family member advised to call PCP or come back to ER if needed.   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    Priority: High   COPD (chronic obstructive pulmonary disease) (HCC) 11/13/2022    Priority: High   Hypercholesterolemia 02/24/2024    Priority: Medium    Stage 3a chronic kidney disease (HCC) 02/24/2024    Priority: Medium    Persistent atrial fibrillation (HCC) 08/25/2020    Priority: Medium    Chronic combined systolic (congestive) and diastolic (congestive) heart failure (HCC)     Priority: Medium    Gait instability 01/22/2016    Priority: Medium    BPH (benign prostatic hyperplasia) 05/02/2015    Priority: Medium    GERD (gastroesophageal reflux disease) 05/02/2015    Priority: Medium    Depression 01/30/2015    Priority: Medium    AAA (abdominal aortic aneurysm) 06/12/2022    Priority: Low   Altered mental status 03/01/2024    Resolved Hospital Problems   Diagnosis Date Noted Date Resolved   Acute metabolic encephalopathy 02/10/2016 03/02/2024    Priority: High     Discharge Instructions     Diet general   Complete by: As directed  Increase activity slowly   Complete by: As directed       Allergies as of 03/02/2024       Reactions   Ddavp [desmopressin Acetate] Other (See Comments)   Hyponatremia    Zoloft  [sertraline ] Other (See Comments)   Felt bad        Medication List     STOP taking these medications    ibuprofen  200 MG tablet Commonly known as: Motrin  IB   oxyCODONE  5 MG immediate release tablet Commonly known as: Roxicodone        TAKE these  medications    rOPINIRole  1 MG tablet Commonly known as: REQUIP  Take 1 mg by mouth at bedtime. The timing of this medication is very important.   acetaminophen  500 MG tablet Commonly known as: TYLENOL  Take 2 tablets (1,000 mg total) by mouth every 6 (six) hours as needed.   apixaban  5 MG Tabs tablet Commonly known as: ELIQUIS  Take 1 tablet (5 mg total) by mouth 2 (two) times daily. What changed: how much to take   Centrum Silver 50+Men Tabs Take 1 tablet by mouth daily.   Denta 5000 Plus 1.1 % Crea dental cream Generic drug: sodium fluoride Place 1 Application onto teeth at bedtime.   DULoxetine  60 MG capsule Commonly known as: CYMBALTA  Take 60 mg by mouth daily.   ferrous sulfate  325 (65 FE) MG tablet Take 1 tablet (325 mg total) by mouth daily with breakfast.   furosemide  40 MG tablet Commonly known as: LASIX  Take 40 mg by mouth daily.   gabapentin  300 MG capsule Commonly known as: NEURONTIN  Take 1 capsule (300 mg total) by mouth 3 (three) times daily. What changed: how much to take   metoprolol  tartrate 100 MG tablet Commonly known as: LOPRESSOR  Take 100 mg by mouth 2 (two) times daily. What changed: Another medication with the same name was removed. Continue taking this medication, and follow the directions you see here.   pantoprazole  40 MG tablet Commonly known as: PROTONIX  Take 1 tablet (40 mg total) by mouth daily.   Prevagen Extra Strength 20 MG Caps Generic drug: Apoaequorin Take 1 capsule by mouth daily.   QUEtiapine  300 MG tablet Commonly known as: SEROQUEL  Take 1 tablet (300 mg total) by mouth at bedtime. What changed:  how much to take when to take this   silodosin 8 MG Caps capsule Commonly known as: RAPAFLO Take 8 mg by mouth daily.   Vitamin D  (Ergocalciferol ) 1.25 MG (50000 UNIT) Caps capsule Commonly known as: DRISDOL  Take 50,000 Units by mouth every Thursday.         Allergies[1]  Consultations:   Procedures:   Discharge Exam: BP 132/80   Pulse (!) 103   Temp 98 F (36.7 C) (Oral)   Resp 16   Ht 6' 2 (1.88 m)   Wt 107 kg   SpO2 93%   BMI 30.29 kg/m  Physical Exam Constitutional:      General: He is not in acute distress.    Appearance: Normal appearance.  HENT:     Head: Normocephalic and atraumatic.     Comments: Bandage on left eat over prior surgical site     Mouth/Throat:     Mouth: Mucous membranes are moist.  Eyes:     Extraocular Movements: Extraocular movements intact.  Cardiovascular:     Rate and Rhythm: Normal rate and regular rhythm.  Pulmonary:     Effort: Pulmonary effort is normal. No respiratory distress.     Breath  sounds: Normal breath sounds. No wheezing.  Abdominal:     General: Bowel sounds are normal. There is no distension.     Palpations: Abdomen is soft.     Tenderness: There is no abdominal tenderness.  Musculoskeletal:        General: Normal range of motion.     Cervical back: Normal range of motion and neck supple.  Skin:    General: Skin is warm and dry.  Neurological:     General: No focal deficit present.     Mental Status: He is alert.  Psychiatric:        Mood and Affect: Mood normal.        Behavior: Behavior normal.      The results of significant diagnostics from this hospitalization (including imaging, microbiology, ancillary and laboratory) are listed below for reference.   Microbiology: Recent Results (from the past 240 hours)  Culture, blood (Routine x 2)     Status: None (Preliminary result)   Collection Time: 03/01/24  5:28 AM   Specimen: BLOOD  Result Value Ref Range Status   Specimen Description BLOOD BLOOD RIGHT WRIST  Final   Special Requests   Final    BOTTLES DRAWN AEROBIC AND ANAEROBIC Blood Culture results may not be optimal due to an inadequate volume of blood received in culture bottles   Culture   Final    NO GROWTH 1 DAY Performed at [facility]  Lab, [address], [place], [place] [address]    Report Status PENDING  Incomplete  Culture, blood (Routine x 2)     Status: None (Preliminary result)   Collection Time: 03/01/24  5:33 AM   Specimen: BLOOD  Result Value Ref Range Status   Specimen Description BLOOD BLOOD LEFT HAND  Final   Special Requests   Final    BOTTLES DRAWN AEROBIC AND ANAEROBIC Blood Culture results may not be optimal due to an inadequate volume of blood received in culture bottles   Culture   Final    NO GROWTH 1 DAY Performed at [facility] Lab, [address], [place], [place] [address]    Report Status PENDING  Incomplete     Labs: BNP (last 3 results) No results for input(s): BNP in the last 8760 hours. Basic Metabolic Panel: Recent Labs  Lab 03/01/24 0528 03/01/24 0555 03/01/24 0556 03/02/24 0604  NA 138 136 136 139  K 3.4* 3.4* 3.5 3.6  CL 97* 97*  --  102  CO2 27  --   --  27  GLUCOSE 108* 109*  --  99  BUN 18 20  --  17  CREATININE 1.46* 1.50*  --  1.43*  CALCIUM  9.0  --   --  8.8*  MG  --   --   --  1.6*  PHOS  --   --   --  2.2*   Liver Function Tests: Recent Labs  Lab 03/01/24 0528 03/02/24 0604  AST 140* 114*  ALT 40 39  ALKPHOS 315* 290*  BILITOT 2.3* 1.9*  PROT 7.2 6.5  ALBUMIN  4.3 3.8   No results for input(s): LIPASE, AMYLASE in the last 168 hours. Recent Labs  Lab 03/01/24 0620 03/02/24 0604  AMMONIA 40* 22   CBC: Recent Labs  Lab 03/01/24 0528 03/01/24 0555 03/01/24 0556 03/02/24 0604  WBC 6.4  --   --  6.8  NEUTROABS 4.4  --   --   --   HGB 12.3* 12.6* 12.6*  11.9*  HCT 36.8* 37.0* 37.0* 36.3*  MCV 100.0  --   --  102.3*  PLT PLATELET CLUMPS NOTED ON SMEAR, UNABLE TO ESTIMATE  --   --  102*   Cardiac Enzymes: No results for input(s): CKTOTAL, CKMB, CKMBINDEX, TROPONINI in the last 168 hours. BNP: Invalid input(s): POCBNP CBG: Recent Labs  Lab 03/01/24 0621  GLUCAP 121*   D-Dimer No results for input(s): DDIMER in the last 72  hours. Hgb A1c No results for input(s): HGBA1C in the last 72 hours. Lipid Profile No results for input(s): CHOL, HDL, LDLCALC, TRIG, CHOLHDL, LDLDIRECT in the last 72 hours. Thyroid  function studies No results for input(s): TSH, T4TOTAL, T3FREE, THYROIDAB in the last 72 hours.  Invalid input(s): FREET3 Anemia work up No results for input(s): VITAMINB12, FOLATE, FERRITIN, TIBC, IRON , RETICCTPCT in the last 72 hours. Urinalysis    Component Value Date/Time   COLORURINE AMBER (A) 03/01/2024 0639   APPEARANCEUR CLEAR 03/01/2024 0639   LABSPEC 1.018 03/01/2024 0639   PHURINE 5.0 03/01/2024 0639   GLUCOSEU NEGATIVE 03/01/2024 0639   HGBUR SMALL (A) 03/01/2024 0639   BILIRUBINUR NEGATIVE 03/01/2024 0639   KETONESUR 5 (A) 03/01/2024 0639   PROTEINUR 30 (A) 03/01/2024 0639   NITRITE NEGATIVE 03/01/2024 0639   LEUKOCYTESUR NEGATIVE 03/01/2024 0639   Sepsis Labs Recent Labs  Lab 03/01/24 0528 03/02/24 0604  WBC 6.4 6.8   Microbiology Recent Results (from the past 240 hours)  Culture, blood (Routine x 2)     Status: None (Preliminary result)   Collection Time: 03/01/24  5:28 AM   Specimen: BLOOD  Result Value Ref Range Status   Specimen Description BLOOD BLOOD RIGHT WRIST  Final   Special Requests   Final    BOTTLES DRAWN AEROBIC AND ANAEROBIC Blood Culture results may not be optimal due to an inadequate volume of blood received in culture bottles   Culture   Final    NO GROWTH 1 DAY Performed at [facility] Lab, [address], [place], [place] [address]    Report Status PENDING  Incomplete  Culture, blood (Routine x 2)     Status: None (Preliminary result)   Collection Time: 03/01/24  5:33 AM   Specimen: BLOOD  Result Value Ref Range Status   Specimen Description BLOOD BLOOD LEFT HAND  Final   Special Requests   Final    BOTTLES DRAWN AEROBIC AND ANAEROBIC Blood Culture results may not be optimal due to an inadequate volume of blood  received in culture bottles   Culture   Final    NO GROWTH 1 DAY Performed at [facility] Lab, [address] [place], [place] [address]    Report Status PENDING  Incomplete    Procedures/Studies: DG Chest Port 1 View Result Date: 03/01/2024 EXAM: 1 VIEW(S) XRAY OF THE CHEST 03/01/2024 07:44:00 AM COMPARISON: 04/02/2023 CLINICAL HISTORY: Tachycardia FINDINGS: LUNGS AND PLEURA: No focal pulmonary opacity. No pleural effusion. No pneumothorax. HEART AND MEDIASTINUM: Mild cardiomegaly. BONES AND SOFT TISSUES: Partially visualized ACDF surgical hardware in lower cervical spine. IMPRESSION: 1. No acute findings. 2. Mild cardiomegaly. Electronically signed by: Waddell Calk MD MD 03/01/2024 07:55 AM EST RP Workstation: [id number]   CT HEAD WO CONTRAST Result Date: 03/01/2024 EXAM: CT HEAD WITHOUT CONTRAST 03/01/2024 06:03:00 AM TECHNIQUE: CT of the head was performed without the administration of intravenous contrast. Automated exposure control, iterative reconstruction, and/or weight based adjustment of the mA/kV was utilized to reduce the radiation dose to as low  as reasonably achievable. COMPARISON: Head CT 06/11/2022, Brain MRI 10/30/2021. CLINICAL HISTORY: 84 year old male. Mental status change, unknown cause. Recent ear surgery. FINDINGS: BRAIN AND VENTRICLES: Brain volume remains normal for age. No acute hemorrhage. No evidence of acute infarct. No hydrocephalus. No extra-axial collection. No mass effect or midline shift. Stable minimal for age periventricular white matter hypodensity. Gray-white differentiation otherwise normal. No suspicious intracranial vascular hyperdensity. Mild for age calcified atherosclerosis at the skull base. ORBITS: No acute abnormality. SINUSES: Paranasal sinuses are clear. Tympanic cavities and mastoids are clear. SOFT TISSUES AND SKULL: Asymmetric swelling of the left pinna with overlying dressing material. No soft tissue gas. No skull fracture. IMPRESSION: 1.  Negative for age non-contrast CT appearance of the brain. 2. Recent postoperative changes to the left pinna. Electronically signed by: Helayne Hurst MD MD 03/01/2024 06:17 AM EST RP Workstation: [id number]     Time coordinating discharge: Over 30 minutes    Alm Apo, MD  [facility] 03/02/2024, 1:24 PM    [1]  Allergies Allergen Reactions   Ddavp [Desmopressin Acetate] Other (See Comments)    Hyponatremia    Zoloft  [Sertraline ] Other (See Comments)    Felt bad    "

## 2024-03-02 NOTE — Evaluation (Signed)
 Physical Therapy Evaluation and Discharge Patient Details Name: Dustin [name] MRN: [id number] DOB: 02-12-1941 Today's Date: 03/02/2024  History of Present Illness  Dustin [name] is a 83-y.o Male who presented with confusion and hallucinations. Found to have acute encephalopathy. 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  Clinical Impression  Currently pt is ind for bed mobility, Mod I for sit to stand and gait with RW. Pt able to stand at EOB and dress without LOB or UE support. Pt has assistance from granddaughter at home as needed. Pt currently states he feels like he is moving at baseline and does not want physical therapy; pt is aware he can ask MD for physical therapy at any time. Currently pt is presenting at baseline level of functioning and no skilled physical therapy services recommended. Pt will be discharged from skilled physical therapy services at this time; please re-consult if further needs arise.            If plan is discharge home, recommend the following: Help with stairs or ramp for entrance;Assistance with cooking/housework     Equipment Recommendations None recommended by PT     Functional Status Assessment Patient has not had a recent decline in their functional status     Precautions / Restrictions Precautions Precautions: Fall Recall of Precautions/Restrictions: Intact Precaution/Restrictions Comments: monitor HR and O2 Restrictions Weight Bearing Restrictions Per Provider Order: No      Mobility  Bed Mobility Overal bed mobility: Independent    Transfers Overall transfer level: Modified independent Equipment used: Rolling walker (2 wheels)   Sit to Stand: Modified independent (Device/Increase time)    Ambulation/Gait Ambulation/Gait assistance: Modified independent (Device/Increase time) Gait  Distance (Feet): 20 Feet Assistive device: Rolling walker (2 wheels) Gait Pattern/deviations: Step-through pattern, Decreased stride length Gait velocity: slightly decreased Gait velocity interpretation: <1.8 ft/sec, indicate of risk for recurrent falls     Balance Overall balance assessment: Mild deficits observed, not formally tested Sitting-balance support: No upper extremity supported, Feet supported Sitting balance-Leahy Scale: Good     Standing balance support: Bilateral upper extremity supported, No upper extremity supported Standing balance-Leahy Scale: Fair Standing balance comment: Pt able to stand and pull up pants without LOB at EOB           Pertinent Vitals/Pain Pain Assessment Pain Assessment: No/denies pain    Home Living Family/patient expects to be discharged to:: Private residence Living Arrangements: Children;Other relatives (grand daughter (39 yo), great grandson (68 yo)) Available Help at Discharge: Family;Available 24 hours/day Type of Home: House Home Access: Stairs to enter Entrance Stairs-Rails: Right;Left;Can reach both Entrance Stairs-Number of Steps: 3 Alternate Level Stairs-Number of Steps: flight Home Layout: Two level;Able to live on main level with bedroom/bathroom Home Equipment: Grab bars - toilet;Grab bars - tub/shower;Hand held shower head;Shower seat;BSC/3in1;Rolling Environmental Consultant (2 wheels);Cane - single point Additional Comments: RW with 4 wheels (he took a regular 2 wheeled RW and added 2 more wheels)    Prior Function Prior Level of Function : Independent/Modified Independent;Driving             Mobility Comments: Uses cane at times when out, furniture cruises as needed inside home ADLs Comments: Mod I for bathing and dressing     Extremity/Trunk Assessment   Upper Extremity Assessment Upper Extremity Assessment: Defer to OT evaluation    Lower Extremity Assessment Lower Extremity Assessment: Overall WFL for  tasks assessed     Cervical / Trunk Assessment Cervical / Trunk Assessment: Normal  Communication   Communication Communication: Impaired Factors Affecting Communication: Hearing impaired    Cognition Arousal: Alert Behavior During Therapy: WFL for tasks assessed/performed   PT - Cognitive impairments: No apparent impairments     Following commands: Intact       Cueing Cueing Techniques: Verbal cues     General Comments General comments (skin integrity, edema, etc.): Vitals remained stable on room air during activity        Assessment/Plan    PT Assessment Patient does not need any further PT services         PT Goals (Current goals can be found in the Care Plan section)  Acute Rehab PT Goals PT Goal Formulation: All assessment and education complete, DC therapy     AM-PAC PT 6 Clicks Mobility  Outcome Measure Help needed turning from your back to your side while in a flat bed without using bedrails?: None Help needed moving from lying on your back to sitting on the side of a flat bed without using bedrails?: None Help needed moving to and from a bed to a chair (including a wheelchair)?: None Help needed standing up from a chair using your arms (e.g., wheelchair or bedside chair)?: None Help needed to walk in hospital room?: None Help needed climbing 3-5 steps with a railing? : A Little 6 Click Score: 23    End of Session Equipment Utilized During Treatment: Gait belt Activity Tolerance: Patient tolerated treatment well Patient left: Other (comment);with nursing/sitter in room (In W/C by nurse station to be transferred) Nurse Communication: Mobility status      Time: 8892-8879 PT Time Calculation (min) (ACUTE ONLY): 13 min   Charges:   PT Evaluation $PT Eval Low Complexity: 1 Low   PT General Charges $$ ACUTE PT VISIT: 1 Visit         Dorothyann Maier, DPT, CLT  Acute Rehabilitation Services Office: [phone] (Secure chat preferred)   Dorothyann VEAR Maier 03/02/2024, 12:01 PM

## 2024-03-02 NOTE — ED Notes (Addendum)
 Ambulated with PT on room air with lowest O2 reading to 93, pt back to 96 in bed. Tolerated well. HR to 144, down to 110 in bed. MD aware.

## 2024-03-02 NOTE — ED Notes (Signed)
 Report given to charge RN megan for pending DC. Patient transported to bridge to await ride (son).

## 2024-03-02 NOTE — Evaluation (Signed)
 Clinical/Bedside Swallow Evaluation Patient Details  Name: Dustin [name] MRN: [id number] Date of Birth: 24-Apr-1940  Today's Date: 03/02/2024 Time: SLP Start Time (ACUTE ONLY): 0836 SLP Stop Time (ACUTE ONLY): 0842 SLP Time Calculation (min) (ACUTE ONLY): 6 min  Past Medical History:  Past Medical History:  Diagnosis Date   Atrial fibrillation (HCC)    BPH (benign prostatic hyperplasia)    Chronic combined systolic and diastolic congestive heart failure (HCC)    CKD (chronic kidney disease)    GERD (gastroesophageal reflux disease)    protonix  for control   Insomnia    Neck pain    Neuropathy    Past Surgical History:  Past Surgical History:  Procedure Laterality Date   ANTERIOR CERVICAL DECOMPRESSION/DISCECTOMY FUSION 4 LEVELS N/A 10/27/2012   Procedure: Cervical Three-Four Cervical Four-Five Cervical Five-Six Cervical Six-Seven  Anterior cervical decompression/diskectomy/fusion;  Surgeon: Fairy Levels, MD;  Location: [facility];  Service: Neurosurgery;  Laterality: N/A;  Cervical Three-Four Cervical Four-Five Cervical Five-Six Cervical Six-Seven  Anterior cervical decompression/diskectomy/fusion   CARDIOVERSION N/A 09/20/2020   Procedure: CARDIOVERSION;  Surgeon: Kate Lonni CROME, MD;  Location: [facility] ENDOSCOPY;  Service: Cardiovascular;  Laterality: N/A;   COMPLEX WOUND CLOSURE Left 02/27/2024   Procedure: COMPLEX CLOSURE, WOUND;  Surgeon: Tobie Eldora NOVAK, MD;  Location: [facility] OR;  Service: ENT;  Laterality: Left;   EXCISION MASS HEAD Left 02/27/2024   Procedure: EXCISION, MASS, HEAD;  Surgeon: Tobie Eldora NOVAK, MD;  Location: [facility] OR;  Service: ENT;  Laterality: Left;  Auricular lesion excision , Complex lesion closure   FEMUR IM NAIL Right 03/08/2020   Procedure: INTRAMEDULLARY (IM) RETROGRADE FEMORAL NAILING;  Surgeon: Kendal Franky SQUIBB, MD;  Location: [facility] OR;  Service: Orthopedics;  Laterality: Right;   HARDWARE REMOVAL Right 03/31/2023   Procedure: HARDWARE REMOVAL;  Surgeon: Kendal Franky SQUIBB, MD;  Location: [facility] OR;  Service: Orthopedics;  Laterality: Right;   INTRAMEDULLARY (IM) NAIL INTERTROCHANTERIC Right 03/31/2023   Procedure: INTRAMEDULLARY (IM) NAIL INTERTROCHANTERIC;  Surgeon: Kendal Franky SQUIBB, MD;  Location: [facility] OR;  Service: Orthopedics;  Laterality: Right;   SHOULDER ARTHROSCOPY  2010   rt shoulder   SHOULDER ARTHROSCOPY  02/15/2011   Procedure: ARTHROSCOPY SHOULDER;  Surgeon: Lamar Prentice Collet;  Location: [facility];  Service: Orthopedics;  Laterality: Right;  Debridement   TONSILLECTOMY     HPI:  Dustin [name] is a 84 yo M who presented with a brief onset of confusion. Per family he has been having some hallucination at home. CT head and CXR 1/12 with no acute findings. Seen by SLP Sept 2024 and Sept 2023 for CSE with recs for regular diet with thin liquid.  Pt with possible aspiration during medication administration overnight with increased O2 requirements.  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    Assessment / Plan / Recommendation  Clinical Impression  Pt presents with functional swallowing as assessed clinically.  Pt tolerated all consistencies trialed, including straw sips of thin liquid, without any clinical s/s of aspiration and exhibited adequate oral clearance of regular texture solid.  Pt with possible aspiration event during medication administration overnight.  CXR with no acute findings.  Ensure pt is fully awake/alert with upright positioning when administering medications.  Pt appears safe to resume oral diet and has no further ST needs.  SLP  will sign off at this time. Please reconsult if pt has a change in functional status.    Recommend regular texture diet with thin liquids.    SLP Visit Diagnosis: Dysphagia, unspecified (R13.10)     Aspiration Risk  No limitations    Diet Recommendation Regular;Thin liquid    Liquid Administration via: Cup;Straw Medication Administration: Whole meds with liquid Supervision: Patient able to self feed Compensations: Slow rate;Small sips/bites Postural Changes: Seated upright at 90 degrees    Other Recommendations Oral Care Recommendations: Oral care BID     Swallow Evaluation Recommendations  N/A   Assistance Recommended at Discharge  N/A  Functional Status Assessment Patient has not had a recent decline in their functional status  Frequency and Duration  (N/A)          Prognosis Prognosis for improved oropharyngeal function:  (N/A)      Swallow Study   General Date of Onset: 02/29/24 HPI: Dustin [name] is a 84 yo M who presented with a brief onset of confusion. Per family he has been having some hallucination at home. CT head and CXR 1/12 with no acute findings. Seen by SLP Sept 2024 and Sept 2023 for CSE with recs for regular diet with thin liquid.  Pt with possible aspiration during medication administration overnight with increased O2 requirements.  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 Type of Study: Bedside Swallow Evaluation Previous Swallow Assessment: Prior clinical evals (see HPI) Diet Prior to this Study: NPO Temperature Spikes Noted: No Respiratory Status: Room air ( around neck, pt with 95-100 SpO2 on room air during eval) History of Recent Intubation: No Behavior/Cognition: Alert;Cooperative;Pleasant mood Oral Cavity Assessment: Within Functional Limits Oral Care Completed by SLP: No Oral Cavity - Dentition: Adequate natural dentition Vision: Functional for self-feeding Self-Feeding Abilities: Able to feed self Patient Positioning: Upright in bed (side of  bed) Baseline Vocal Quality: Normal Volitional Cough: Strong Volitional Swallow: Able to elicit    Oral/Motor/Sensory Function Overall Oral Motor/Sensory Function: Within functional limits   Ice Chips Ice chips: Not tested   Thin Liquid Thin Liquid: Within functional limits Presentation: Straw    Nectar Thick Nectar Thick Liquid: Not tested   Honey Thick Honey Thick Liquid: Not tested   Puree Puree: Within functional limits Presentation: Self Fed   Solid     Solid: Within functional limits Presentation: Self Fed      Anette FORBES Grippe, MA, CCC-SLP Acute Rehabilitation Services Office: [phone] 03/02/2024,8:54 AM

## 2024-03-02 NOTE — Care Management Obs Status (Signed)
 MEDICARE OBSERVATION STATUS NOTIFICATION   Patient Details  Name: Dustin [name] MRN: [id number] Date of Birth: October 13, 1940   Medicare Observation Status Notification Given:  Yes    Debarah Saunas, RN 03/02/2024, 8:08 AM

## 2024-03-02 NOTE — ED Notes (Signed)
 Pt placed on hospital bed from ED treatment stretcher. Pt followed verbal requests, stood & pivoted to bed with 1 person assist. Pt resting bed locked in lowest position with HOB @ 30 degrees, bed alarm engaged. Pt denies any needs at this time.

## 2024-03-02 NOTE — Evaluation (Signed)
 Occupational Therapy Evaluation and Discharge Patient Details Name: Dustin [name] MRN: [id number] DOB: Dec 28, 1940 Today's Date: 03/02/2024   History of Present Illness   Dustin [name] is a 84-y.o Male who presented with confusion and hallucinations. Found to have acute encephalopathy. 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     Clinical Impressions This 84 yo male admitted with above presents to acute OT with PLOF of being independent to Mod I with all basic ADLs and mobility. Currently he is setup-CGA at [facility] level for basic ADLs with sats maintaining at 93% or better when up and about but HR as high as 144 and pt sounds SOB but he reports not bad. No further acute care or follow up OT needs, we will D/C from OT.     If plan is discharge home, recommend the following:   A little help with walking and/or transfers;A little help with bathing/dressing/bathroom;Assistance with cooking/housework;Assist for transportation;Help with stairs or ramp for entrance     Functional Status Assessment   Patient has had a recent decline in their functional status and demonstrates the ability to make significant improvements in function in a reasonable and predictable amount of time. (without need for futher OT)     Equipment Recommendations   None recommended by OT      Precautions/Restrictions   Precautions Precautions: Fall Precaution/Restrictions Comments: monitor HR and O2 Restrictions Weight Bearing Restrictions Per Provider Order: No     Mobility Bed Mobility Overal bed mobility: Independent                  Transfers Overall transfer level: Needs assistance Equipment used: Rolling walker (2 wheels) Transfers: Sit to/from Stand Sit to Stand: Contact guard assist                  Balance Overall  balance assessment: Needs assistance Sitting-balance support: No upper extremity supported, Feet supported Sitting balance-Leahy Scale: Good     Standing balance support: Bilateral upper extremity supported, Reliant on assistive device for balance Standing balance-Leahy Scale: Poor Standing balance comment: reliant on RW                           ADL either performed or assessed with clinical judgement   ADL Overall ADL's : Needs assistance/impaired Eating/Feeding: Independent;Maximal assistance   Grooming: Contact guard assist;Standing Grooming Details (indicate cue type and reason): at RW level Upper Body Bathing: Set up;Sitting   Lower Body Bathing: Sit to/from stand;Set up;Contact guard assist Lower Body Bathing Details (indicate cue type and reason): at RW level Upper Body Dressing : Set up;Sitting   Lower Body Dressing: Set up;Contact guard assist Lower Body Dressing Details (indicate cue type and reason): at RW level Toilet Transfer: Contact guard assist;Ambulation;Rolling walker (2 wheels) Toilet Transfer Details (indicate cue type and reason): simulated bed>ambulate in hallway>back to bed Toileting- Clothing Manipulation and Hygiene: Contact guard assist;Sit to/from stand Toileting - Clothing Manipulation Details (indicate cue type and reason): at RW level             Vision Patient Visual Report: No change from baseline              Pertinent Vitals/Pain Pain Assessment Pain Assessment: No/denies pain     Extremity/Trunk Assessment Upper Extremity Assessment Upper Extremity Assessment: Overall WFL for tasks assessed  Communication Communication Communication: Impaired Factors Affecting Communication: Hearing impaired   Cognition Arousal: Alert Behavior During Therapy: WFL for tasks assessed/performed Cognition: No apparent impairments                               Following commands: Intact        Cueing  General Comments   Cueing Techniques: Verbal cues  O2 sats on 2 liters 96% on RA as low as 93% while ambulating; however HR started in 100's and went as high as 144 while ambulating           Home Living Family/patient expects to be discharged to:: Private residence Living Arrangements: Children;Other relatives (grand daughter (25 yo), great grandson (43 yo)) Available Help at Discharge: Family;Available 24 hours/day Type of Home: House Home Access: Stairs to enter [employer] of Steps: 3 Entrance Stairs-Rails: Right;Left;Can reach both Home Layout: Two level;Able to live on main level with bedroom/bathroom Alternate Level Stairs-Number of Steps: flight Alternate Level Stairs-Rails: Left Bathroom Shower/Tub: Walk-in shower;Door   Bathroom Toilet: Handicapped height     Home Equipment: Grab bars - toilet;Grab bars - tub/shower;Hand held shower head;Shower seat;BSC/3in1;Rolling Environmental Consultant (2 wheels);Cane - single point   Additional Comments: RW with 4 wheels (he took a regular 2 wheeled RW and added 2 more wheels)      Prior Functioning/Environment Prior Level of Function : Independent/Modified Independent;Driving             Mobility Comments: Uses cane at times when out, furniture cruises as needed inside home ADLs Comments: Mod I for bathing and dressing    OT Problem List: Impaired balance (sitting and/or standing)        OT Goals(Current goals can be found in the care plan section)   Acute Rehab OT Goals Patient Stated Goal: to go home today OT Goal Formulation: With patient Time For Goal Achievement: 03/16/24         AM-PAC OT 6 Clicks Daily Activity     Outcome Measure Help from another person eating meals?: None Help from another person taking care of personal grooming?: A Little Help from another person toileting, which includes using toliet, bedpan, or urinal?: A Little Help from another person bathing (including washing,  rinsing, drying)?: A Little Help from another person to put on and taking off regular upper body clothing?: A Little Help from another person to put on and taking off regular lower body clothing?: A Little 6 Click Score: 19   End of Session Equipment Utilized During Treatment: Gait belt;Rolling walker (2 wheels) Nurse Communication: Mobility status (sats and HR)  Activity Tolerance: Patient tolerated treatment well Patient left: with bed alarm set (sitting EOB)  OT Visit Diagnosis: Unsteadiness on feet (R26.81);Other abnormalities of gait and mobility (R26.89);Muscle weakness (generalized) (M62.81)                Time: 8981-8962 OT Time Calculation (min): 19 min Charges:  OT General Charges $OT Visit: 1 Visit OT Evaluation $OT Eval Moderate Complexity: 1 Mod  Cathy L. OT Acute Rehabilitation Services Office [phone]    Rodgers Dorothyann Distel 03/02/2024, 11:40 AM

## 2024-03-06 LAB — CULTURE, BLOOD (ROUTINE X 2)
Culture: NO GROWTH
Culture: NO GROWTH

## 2024-03-11 ENCOUNTER — Encounter (INDEPENDENT_AMBULATORY_CARE_PROVIDER_SITE_OTHER): Payer: Self-pay | Admitting: Otolaryngology

## 2024-03-11 ENCOUNTER — Ambulatory Visit (INDEPENDENT_AMBULATORY_CARE_PROVIDER_SITE_OTHER): Admitting: Otolaryngology

## 2024-03-11 VITALS — BP 100/65 | HR 88 | Ht 74.0 in | Wt 235.0 lb

## 2024-03-11 DIAGNOSIS — C44229 Squamous cell carcinoma of skin of left ear and external auricular canal: Secondary | ICD-10-CM | POA: Diagnosis not present

## 2024-03-11 NOTE — Progress Notes (Signed)
 Dear Dr. Regino, Here is my assessment for our mutual patient, Dustin [name]. Thank you for allowing me the opportunity to care for your patient. Please do not hesitate to contact me should you have any other questions. Sincerely, Dr. Hadassah Parody  Otolaryngology Clinic Note Referring provider: Dr. Regino HPI:   Initial visit (01/2024): Left ear lesion ongoing for about 3 months, he tried to stick a needle in it to see if it would drain, but did not help. Not getting bigger, stays there. If he manipulates, it does hurt. No pain without manipulation. No drainage. No fevers. Does have h/o hearing loss for which he wears hearing aids. No numbness or ear, no weakness of face Patient otherwise denies: - dysphagia, odynophagia, unintentional weight loss - ear pain, neck masses  Never had a biopsy of this.  No recent skin cancers -- has had some skin cancers but he does not remember exactly where on face  --------------------------------------------------------- 02/17/2024 We discussed his pathology results. He reports that he has some discomfort over the ear but has not gotten bigger. No drainage or fevers. No bleeding. --------------------------------------------------------- 03/11/2024 S/p partial auriculectomy and closure 02/27/2023. Admitted for delirium but now recovered. He is otherwise doing well from ear standpoint; no pain or drainage. Keeping ear dry. We discussed his path  ENT Surgery: see above;  Personal or FHx of bleeding dz or anesthesia difficulty: no  AP/AC: Eliquis   Tobacco: long history, quit PMHx: A-fib, GERD, Eczema, RLS, GERD, Insomnia, MDD, CKD  Independent Review of Additional Tests or Records:  Referral note Dibas Koirala, MD 01/27/24: bump on left tragus, attempted to drain it, did not help; Dx: Ear lesion; Rx: ref to ENT Dr. Jesus 09/04/2021: noted left external ear lesion, no trauma. Noted 1 cm superficial ulceration left scaphoid; rec abx ointment; then  reoslved.  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 Path (02/27/2024):   PMH/Meds/All/SocHx/FamHx/ROS:   Past Medical History:  Diagnosis Date   Atrial fibrillation (HCC)    BPH (benign prostatic hyperplasia)    Chronic combined systolic and diastolic congestive heart failure (HCC)    CKD (chronic kidney disease)    GERD (gastroesophageal reflux disease)    protonix  for control   Insomnia    Neck pain    Neuropathy      Past Surgical History:  Procedure Laterality Date   ANTERIOR CERVICAL DECOMPRESSION/DISCECTOMY FUSION 4 LEVELS N/A 10/27/2012   Procedure: Cervical Three-Four Cervical Four-Five Cervical Five-Six Cervical Six-Seven  Anterior cervical decompression/diskectomy/fusion;  Surgeon: Fairy Levels, MD;  Location: [facility];  Service: Neurosurgery;  Laterality: N/A;  Cervical Three-Four Cervical Four-Five Cervical Five-Six Cervical Six-Seven  Anterior cervical decompression/diskectomy/fusion   CARDIOVERSION N/A 09/20/2020   Procedure: CARDIOVERSION;  Surgeon: Kate Lonni CROME, MD;  Location: [facility] ENDOSCOPY;  Service: Cardiovascular;  Laterality: N/A;   COMPLEX WOUND CLOSURE Left 02/27/2024   Procedure: COMPLEX CLOSURE, WOUND;  Surgeon: Tobie Eldora NOVAK, MD;  Location: [facility] OR;  Service: ENT;  Laterality: Left;   EXCISION MASS HEAD Left 02/27/2024   Procedure: EXCISION, MASS, HEAD;  Surgeon: Tobie Eldora NOVAK, MD;  Location: [facility] OR;  Service: ENT;  Laterality: Left;  Auricular lesion excision , Complex lesion closure   FEMUR IM NAIL Right 03/08/2020   Procedure: INTRAMEDULLARY (IM) RETROGRADE FEMORAL NAILING;  Surgeon: Kendal Franky SQUIBB, MD;  Location: [facility] OR;  Service: Orthopedics;  Laterality: Right;   HARDWARE REMOVAL Right 03/31/2023   Procedure: HARDWARE REMOVAL;  Surgeon: Kendal,  Franky SQUIBB, MD;  Location: [facility] OR;  Service: Orthopedics;  Laterality: Right;   INTRAMEDULLARY (IM) NAIL INTERTROCHANTERIC Right 03/31/2023    Procedure: INTRAMEDULLARY (IM) NAIL INTERTROCHANTERIC;  Surgeon: Kendal Franky SQUIBB, MD;  Location: [facility] OR;  Service: Orthopedics;  Laterality: Right;   SHOULDER ARTHROSCOPY  2010   rt shoulder   SHOULDER ARTHROSCOPY  02/15/2011   Procedure: ARTHROSCOPY SHOULDER;  Surgeon: Lamar Prentice Collet;  Location: [facility];  Service: Orthopedics;  Laterality: Right;  Debridement   TONSILLECTOMY      Family History  Problem Relation Age of Onset   Emphysema Mother    Cirrhosis Father    Stroke Brother      Social Connections: Not on file     Current Outpatient Medications  Medication Instructions   acetaminophen  (TYLENOL ) 1,000 mg, Oral, Every 6 hours PRN   apixaban  (ELIQUIS ) 5 mg, Oral, 2 times daily   Apoaequorin (PREVAGEN EXTRA STRENGTH) 20 MG CAPS 1 capsule, Daily   DENTA 5000 PLUS 1.1 % CREA dental cream 1 Application, Daily at bedtime   DULoxetine  (CYMBALTA ) 60 mg, Daily   ferrous sulfate  325 mg, Oral, Daily with breakfast   furosemide  (LASIX ) 40 mg, Daily   gabapentin  (NEURONTIN ) 300 mg, Oral, 3 times daily   metoprolol  tartrate (LOPRESSOR ) 100 mg, 2 times daily   Multiple Vitamins-Minerals (CENTRUM SILVER 50+MEN) TABS 1 tablet, Daily   pantoprazole  (PROTONIX ) 40 mg, Oral, Daily   QUEtiapine  (SEROQUEL ) 300 mg, Oral, Daily at bedtime   rOPINIRole  (REQUIP ) 1 mg, Daily at bedtime   silodosin (RAPAFLO) 8 mg, Daily   Vitamin D  (Ergocalciferol ) (DRISDOL ) 50,000 Units, Every Thu     Physical Exam:   BP 100/65 (BP Location: Left Arm, Patient Position: Sitting, Cuff Size: Large)   Pulse 88   Ht 6' 2 (1.88 m)   Wt 235 lb (106.6 kg)   SpO2 (!) 85%   BMI 30.17 kg/m   Salient findings:  CN II-XII intact Bilateral EAC clear and TM intact with well pneumatized middle ear spaces; HA in place Bolster removed left ear; auricular incision healing appropriately without sign of infection or hematoma or drainage. Slight dehiscence medial part of helical rim; cartilage  protruding but otherwise no lesions Parotids soft, no lesions; no postauricular lymphadenopathy No obviously palpable neck masses/lymphadenopathy/thyromegaly No respiratory distress or stridor  Seprately Identifiable Procedures:  Prior to initiating any procedures, risks/benefits/alternatives were explained to the patient and verbal or written consent obtained. Bolster removed from left ear   Electronically signed by: Eldora KATHEE Blanch, MD 03/11/2024 12:46 PM    Impression & Plans:  Dustin [name] is a 84 y.o. male with:  1. Squamous cell carcinoma of ear, left    Well differentiated squamous cell carcinoma, margins negative Do not feel strongly he needs CT currently given path Ok to resume all activities; vaseline to incision BID F/u 2 months, sooner as necessary  See below regarding exact medications prescribed this encounter including dosages and route: No orders of the defined types were placed in this encounter.     Thank you for allowing me the opportunity to care for your patient. Please do not hesitate to contact me should you have any other questions.  Sincerely, Eldora Blanch MD Otolaryngologist (ENT), [facility] Phone: [phone] Fax: [phone]  MDM:  [id number] Complexity/Problems addressed: mod - chronic problem Data complexity: low - review of pathology - Morbidity: low - Prescription Drug prescribed or managed: n

## 2024-03-11 NOTE — Patient Instructions (Signed)
 Use vaseline left ear twice daily for 6 weeks

## 2024-03-25 ENCOUNTER — Other Ambulatory Visit: Payer: Self-pay

## 2024-03-25 ENCOUNTER — Inpatient Hospital Stay (HOSPITAL_COMMUNITY)
Admission: EM | Admit: 2024-03-25 | Source: Home / Self Care | Attending: Critical Care Medicine | Admitting: Critical Care Medicine

## 2024-03-25 ENCOUNTER — Emergency Department (HOSPITAL_COMMUNITY)

## 2024-03-25 DIAGNOSIS — E8729 Other acidosis: Secondary | ICD-10-CM | POA: Diagnosis not present

## 2024-03-25 DIAGNOSIS — R918 Other nonspecific abnormal finding of lung field: Secondary | ICD-10-CM

## 2024-03-25 DIAGNOSIS — I959 Hypotension, unspecified: Secondary | ICD-10-CM | POA: Diagnosis not present

## 2024-03-25 DIAGNOSIS — D696 Thrombocytopenia, unspecified: Secondary | ICD-10-CM

## 2024-03-25 DIAGNOSIS — J9601 Acute respiratory failure with hypoxia: Secondary | ICD-10-CM | POA: Diagnosis not present

## 2024-03-25 DIAGNOSIS — G9340 Encephalopathy, unspecified: Secondary | ICD-10-CM | POA: Diagnosis not present

## 2024-03-25 DIAGNOSIS — E7220 Disorder of urea cycle metabolism, unspecified: Secondary | ICD-10-CM | POA: Diagnosis not present

## 2024-03-25 DIAGNOSIS — N1831 Chronic kidney disease, stage 3a: Secondary | ICD-10-CM | POA: Diagnosis not present

## 2024-03-25 DIAGNOSIS — N179 Acute kidney failure, unspecified: Secondary | ICD-10-CM | POA: Diagnosis not present

## 2024-03-25 DIAGNOSIS — R400 Somnolence: Principal | ICD-10-CM

## 2024-03-25 DIAGNOSIS — G9341 Metabolic encephalopathy: Secondary | ICD-10-CM | POA: Diagnosis present

## 2024-03-25 LAB — CBC WITH DIFFERENTIAL/PLATELET
Abs Immature Granulocytes: 0.15 10*3/uL — ABNORMAL HIGH (ref 0.00–0.07)
Basophils Absolute: 0 10*3/uL (ref 0.0–0.1)
Basophils Relative: 0 %
Eosinophils Absolute: 0.1 10*3/uL (ref 0.0–0.5)
Eosinophils Relative: 2 %
HCT: 38 % — ABNORMAL LOW (ref 39.0–52.0)
Hemoglobin: 12.3 g/dL — ABNORMAL LOW (ref 13.0–17.0)
Immature Granulocytes: 2 %
Lymphocytes Relative: 13 %
Lymphs Abs: 1.1 10*3/uL (ref 0.7–4.0)
MCH: 33.1 pg (ref 26.0–34.0)
MCHC: 32.4 g/dL (ref 30.0–36.0)
MCV: 102.2 fL — ABNORMAL HIGH (ref 80.0–100.0)
Monocytes Absolute: 1 10*3/uL (ref 0.1–1.0)
Monocytes Relative: 11 %
Neutro Abs: 6.2 10*3/uL (ref 1.7–7.7)
Neutrophils Relative %: 72 %
Platelets: 82 10*3/uL — ABNORMAL LOW (ref 150–400)
RBC: 3.72 MIL/uL — ABNORMAL LOW (ref 4.22–5.81)
RDW: 14.8 % (ref 11.5–15.5)
Smear Review: NORMAL
WBC: 8.6 10*3/uL (ref 4.0–10.5)
nRBC: 0 % (ref 0.0–0.2)

## 2024-03-25 LAB — I-STAT VENOUS BLOOD GAS, ED
Acid-base deficit: 3 mmol/L — ABNORMAL HIGH (ref 0.0–2.0)
Bicarbonate: 22.7 mmol/L (ref 20.0–28.0)
Calcium, Ion: 0.97 mmol/L — ABNORMAL LOW (ref 1.15–1.40)
HCT: 37 % — ABNORMAL LOW (ref 39.0–52.0)
Hemoglobin: 12.6 g/dL — ABNORMAL LOW (ref 13.0–17.0)
O2 Saturation: 84 %
Potassium: 4.4 mmol/L (ref 3.5–5.1)
Sodium: 141 mmol/L (ref 135–145)
TCO2: 24 mmol/L (ref 22–32)
pCO2, Ven: 40.5 mmHg — ABNORMAL LOW (ref 44–60)
pH, Ven: 7.358 (ref 7.25–7.43)
pO2, Ven: 50 mmHg — ABNORMAL HIGH (ref 32–45)

## 2024-03-25 LAB — I-STAT CG4 LACTIC ACID, ED: Lactic Acid, Venous: 2.7 mmol/L (ref 0.5–1.9)

## 2024-03-25 LAB — ACETAMINOPHEN LEVEL: Acetaminophen (Tylenol), Serum: <10 ug/mL — ABNORMAL LOW (ref 10–30)

## 2024-03-25 LAB — TSH: TSH: 21.7 u[IU]/mL — ABNORMAL HIGH (ref 0.350–4.500)

## 2024-03-25 LAB — ETHANOL: Alcohol, Ethyl (B): <15 mg/dL

## 2024-03-25 LAB — CBG MONITORING, ED: Glucose-Capillary: 79 mg/dL (ref 70–99)

## 2024-03-25 LAB — SALICYLATE LEVEL: Salicylate Lvl: <7 mg/dL — ABNORMAL LOW (ref 7.0–30.0)

## 2024-03-25 LAB — AMMONIA: Ammonia: 50 umol/L — ABNORMAL HIGH (ref 9–35)

## 2024-03-25 MED ORDER — SODIUM CHLORIDE 0.9 % IV BOLUS
1000.0000 mL | Freq: Once | INTRAVENOUS | Status: AC
  Administered 2024-03-25: 1000 mL via INTRAVENOUS

## 2024-03-25 MED ORDER — NOREPINEPHRINE 4 MG/250ML-% IV SOLN
0.0000 ug/min | INTRAVENOUS | Status: AC
  Administered 2024-03-26: 5 ug/min via INTRAVENOUS
  Administered 2024-03-26: 7 ug/min via INTRAVENOUS
  Filled 2024-03-25 (×2): qty 250

## 2024-03-25 MED ORDER — IOHEXOL 350 MG/ML SOLN
75.0000 mL | Freq: Once | INTRAVENOUS | Status: AC | PRN
  Administered 2024-03-25: 75 mL via INTRAVENOUS

## 2024-03-25 MED ORDER — IPRATROPIUM-ALBUTEROL 0.5-2.5 (3) MG/3ML IN SOLN
3.0000 mL | RESPIRATORY_TRACT | Status: AC | PRN
  Administered 2024-03-26 (×2): 3 mL via RESPIRATORY_TRACT
  Filled 2024-03-25 (×2): qty 3

## 2024-03-25 MED ORDER — SENNA 8.6 MG PO TABS: 1.0000 | ORAL_TABLET | Freq: Two times a day (BID) | ORAL | Status: AC | PRN

## 2024-03-25 MED ORDER — SENNA 8.6 MG PO TABS
1.0000 | ORAL_TABLET | Freq: Two times a day (BID) | ORAL | Status: AC
  Administered 2024-03-26: 8.6 mg
  Filled 2024-03-25: qty 1

## 2024-03-25 MED ORDER — FENTANYL CITRATE (PF) 50 MCG/ML IJ SOSY: 50.0000 ug | PREFILLED_SYRINGE | INTRAMUSCULAR | Status: DC | PRN

## 2024-03-25 MED ORDER — KETAMINE HCL 50 MG/5ML IJ SOSY
PREFILLED_SYRINGE | INTRAMUSCULAR | Status: AC
  Filled 2024-03-25: qty 10

## 2024-03-25 MED ORDER — POLYETHYLENE GLYCOL 3350 17 G PO PACK: 17.0000 g | PACK | Freq: Every day | ORAL | Status: AC | PRN

## 2024-03-25 MED ORDER — FENTANYL CITRATE (PF) 50 MCG/ML IJ SOSY
PREFILLED_SYRINGE | INTRAMUSCULAR | Status: AC
  Administered 2024-03-25: 100 ug via INTRAVENOUS
  Filled 2024-03-25: qty 2

## 2024-03-25 MED ORDER — VANCOMYCIN HCL 2000 MG/400ML IV SOLN
2000.0000 mg | Freq: Once | INTRAVENOUS | Status: AC
  Administered 2024-03-26: 2000 mg via INTRAVENOUS
  Filled 2024-03-25: qty 400

## 2024-03-25 MED ORDER — THIAMINE HCL 100 MG/ML IJ SOLN
500.0000 mg | Freq: Once | INTRAVENOUS | Status: AC
  Administered 2024-03-25: 500 mg via INTRAVENOUS
  Filled 2024-03-25: qty 5

## 2024-03-25 MED ORDER — PIPERACILLIN-TAZOBACTAM 3.375 G IVPB 30 MIN
3.3750 g | Freq: Once | INTRAVENOUS | Status: AC
  Administered 2024-03-26: 3.375 g via INTRAVENOUS
  Filled 2024-03-25: qty 50

## 2024-03-25 MED ORDER — CALCIUM GLUCONATE-NACL 2-0.675 GM/100ML-% IV SOLN
2.0000 g | Freq: Once | INTRAVENOUS | Status: AC
  Administered 2024-03-26: 2000 mg via INTRAVENOUS
  Filled 2024-03-25: qty 100

## 2024-03-25 MED ORDER — CHLORHEXIDINE GLUCONATE CLOTH 2 % EX PADS
6.0000 | MEDICATED_PAD | Freq: Every day | CUTANEOUS | Status: AC
  Administered 2024-03-26: 6 via TOPICAL

## 2024-03-25 MED ORDER — SODIUM CHLORIDE 0.9 % IV SOLN
250.0000 mL | INTRAVENOUS | Status: AC
  Administered 2024-03-26: 250 mL via INTRAVENOUS

## 2024-03-25 MED ORDER — MIDAZOLAM HCL (PF) 2 MG/2ML IJ SOLN: 1.0000 mg | INTRAMUSCULAR | Status: DC | PRN

## 2024-03-25 MED ORDER — DEXMEDETOMIDINE HCL IN NACL 400 MCG/100ML IV SOLN: 0.0000 ug/kg/h | INTRAVENOUS | Status: DC

## 2024-03-25 MED ORDER — FAMOTIDINE 20 MG PO TABS
20.0000 mg | ORAL_TABLET | Freq: Two times a day (BID) | ORAL | Status: DC
  Administered 2024-03-26 (×2): 20 mg
  Filled 2024-03-25 (×2): qty 1

## 2024-03-25 MED ORDER — PROPOFOL 1000 MG/100ML IV EMUL
INTRAVENOUS | Status: AC
  Administered 2024-03-25: 40 ug/kg/min via INTRAVENOUS
  Filled 2024-03-25: qty 100

## 2024-03-25 MED ORDER — POLYETHYLENE GLYCOL 3350 17 G PO PACK
17.0000 g | PACK | Freq: Every day | ORAL | Status: AC
  Administered 2024-03-26: 17 g
  Filled 2024-03-25: qty 1

## 2024-03-25 MED ORDER — INSULIN ASPART 100 UNIT/ML IJ SOLN: 0.0000 [IU] | INTRAMUSCULAR | Status: AC

## 2024-03-25 MED ORDER — ETOMIDATE 2 MG/ML IV SOLN
INTRAVENOUS | Status: AC | PRN
  Administered 2024-03-25: 20 mg via INTRAVENOUS

## 2024-03-25 MED ORDER — NOREPINEPHRINE 4 MG/250ML-% IV SOLN
INTRAVENOUS | Status: AC
  Administered 2024-03-25: 4 ug/min via INTRAVENOUS
  Filled 2024-03-25: qty 250

## 2024-03-25 MED ORDER — FENTANYL CITRATE (PF) 50 MCG/ML IJ SOSY: 100.0000 ug | PREFILLED_SYRINGE | Freq: Once | INTRAMUSCULAR | Status: AC

## 2024-03-25 MED ORDER — ROCURONIUM BROMIDE 10 MG/ML (PF) SYRINGE
PREFILLED_SYRINGE | INTRAVENOUS | Status: AC | PRN
  Administered 2024-03-25: 100 mg via INTRAVENOUS

## 2024-03-25 MED ORDER — SODIUM CHLORIDE 0.9 % IV SOLN
100.0000 mg | Freq: Two times a day (BID) | INTRAVENOUS | Status: DC
  Administered 2024-03-26 (×2): 100 mg via INTRAVENOUS
  Filled 2024-03-25 (×2): qty 100

## 2024-03-25 NOTE — ED Provider Notes (Signed)
 " [place] [facility] Provider Note   CSN: [id number] Arrival date & time: 03/25/24  2140     Patient presents with: Fall and Altered Mental Status   Dustin [name] is a 84 y.o. male.   84 yo M with with a chief complaints of altered mental status.  Per EMS the patient was found by this crawling around the room and thought he had maybe fallen.  Patient was unable to provide much history and was having rhythmic like jerking motions.  They were unable to get him out of the house and so ended up giving him 5 mg of IM Versed .  Since then they report that he has been much worse.  Mentation has become diminished.  Continues to localize to pain.  Level 5 caveat altered mental status.   Fall  Altered Mental Status      Prior to Admission medications  Medication Sig Start Date End Date Taking? Authorizing Provider  acetaminophen  (TYLENOL ) 500 MG tablet Take 2 tablets (1,000 mg total) by mouth every 6 (six) hours as needed. 02/27/24 02/26/25  Tobie Eldora NOVAK, MD  apixaban  (ELIQUIS ) 5 MG TABS tablet Take 1 tablet (5 mg total) by mouth 2 (two) times daily. 03/02/24   Patsy Lenis, MD  Apoaequorin (PREVAGEN EXTRA STRENGTH) 20 MG CAPS Take 1 capsule by mouth daily.    [provider]  DENTA 5000 PLUS 1.1 % CREA dental cream Place 1 Application onto teeth at bedtime. 03/06/23   [provider]  DULoxetine  (CYMBALTA ) 60 MG capsule Take 60 mg by mouth daily. 05/08/22   [provider]  ferrous sulfate  325 (65 FE) MG tablet Take 1 tablet (325 mg total) by mouth daily with breakfast. 06/14/22   Lue Elsie BROCKS, MD  furosemide  (LASIX ) 40 MG tablet Take 40 mg by mouth daily. 02/13/24   [provider]  gabapentin  (NEURONTIN ) 300 MG capsule Take 1 capsule (300 mg total) by mouth 3 (three) times daily. 03/02/24   Patsy Lenis, MD  metoprolol  tartrate (LOPRESSOR ) 100 MG tablet Take 100 mg by mouth 2 (two) times daily. 04/08/23    [provider]  Multiple Vitamins-Minerals (CENTRUM SILVER 50+MEN) TABS Take 1 tablet by mouth daily.    [provider]  pantoprazole  (PROTONIX ) 40 MG tablet Take 1 tablet (40 mg total) by mouth daily. 03/18/20   Marilee Leach, MD  QUEtiapine  (SEROQUEL ) 300 MG tablet Take 1 tablet (300 mg total) by mouth at bedtime. Patient taking differently: Take 600 mg by mouth in the morning and at bedtime. 04/08/23   Raenelle Coria, MD  rOPINIRole  (REQUIP ) 1 MG tablet Take 1 mg by mouth at bedtime.    [provider]  silodosin (RAPAFLO) 8 MG CAPS capsule Take 8 mg by mouth daily. 03/18/22   [provider]  Vitamin D , Ergocalciferol , (DRISDOL ) 1.25 MG (50000 UNIT) CAPS capsule Take 50,000 Units by mouth every Thursday. 09/09/21   [provider]    Allergies: Ddavp [desmopressin acetate] and Zoloft  [sertraline ]    Review of Systems  Updated Vital Signs BP 98/74   Pulse 100   Temp 98.9 F (37.2 C) (Axillary)   Resp 18   Ht 6' (1.829 m)   Wt 106 kg   SpO2 100%   BMI 31.69 kg/m   Physical Exam Vitals and nursing note reviewed.  Constitutional:      Appearance: He is well-developed.  HENT:     Head: Normocephalic and atraumatic.  Eyes:  Pupils: Pupils are equal, round, and reactive to light.  Neck:     Vascular: No JVD.  Cardiovascular:     Rate and Rhythm: Normal rate and regular rhythm.     Heart sounds: No murmur heard.    No friction rub. No gallop.  Pulmonary:     Effort: No respiratory distress.     Breath sounds: No wheezing.  Abdominal:     General: There is no distension.     Tenderness: There is no abdominal tenderness. There is no guarding or rebound.  Musculoskeletal:        General: Normal range of motion.     Cervical back: Normal range of motion and neck supple.  Skin:    Coloration: Skin is not pale.     Findings: No rash.  Neurological:     GCS: GCS eye subscore is 1. GCS verbal subscore is 1. GCS motor subscore  is 5.     Comments: Patient is moving his arms and legs like he is uncomfortable.  He does localize to pain better on the right than the left.  Psychiatric:        Behavior: Behavior normal.     (all labs ordered are listed, but only abnormal results are displayed) Labs Reviewed  I-STAT CG4 LACTIC ACID, ED - Abnormal; Notable for the following components:      Result Value   Lactic Acid, Venous 2.7 (*)    All other components within normal limits  I-STAT VENOUS BLOOD GAS, ED - Abnormal; Notable for the following components:   pCO2, Ven 40.5 (*)    pO2, Ven 50 (*)    Acid-base deficit 3.0 (*)    Calcium , Ion 0.97 (*)    HCT 37.0 (*)    Hemoglobin 12.6 (*)    All other components within normal limits  CULTURE, BLOOD (ROUTINE X 2)  CULTURE, BLOOD (ROUTINE X 2)  AMMONIA  COMPREHENSIVE METABOLIC PANEL WITH GFR  ETHANOL  CBC WITH DIFFERENTIAL/PLATELET  URINALYSIS, ROUTINE W REFLEX MICROSCOPIC  TSH  VITAMIN B1  LIPASE, BLOOD  CK  TRIGLYCERIDES  ACETAMINOPHEN  LEVEL  SALICYLATE LEVEL  CBG MONITORING, ED  CBG MONITORING, ED    EKG: EKG Interpretation Date/Time:  Thursday March 25 2024 21:44:53 EST Ventricular Rate:  120 PR Interval:    QRS Duration:  99 QT Interval:  375 QTC Calculation: 517 R Axis:   43  Text Interpretation: Atrial fibrillation Low voltage, extremity leads Nonspecific T abnormalities, lateral leads Prolonged QT interval No significant change since last tracing Confirmed by Emil Share [phone]) on 03/25/2024 11:12:49 PM  Radiology: [facility] Abdomen 1 View Result Date: 03/25/2024 CLINICAL DATA:  Enteric catheter placement, history of altered level of consciousness EXAM: ABDOMEN - 1 VIEW COMPARISON:  None Available. FINDINGS: Frontal view of the abdomen demonstrates enteric catheter passing below diaphragm, tip projecting over the gastric fundus. Side port projects approximately 4 cm above the gastroesophageal junction. Bowel gas pattern is unremarkable. IMPRESSION:  1. Enteric catheter tip projecting over the gastric fundus. The side port projects approximately 4 cm above the gastroesophageal junction. Electronically Signed   By: Ozell Daring M.D.   On: 03/25/2024 22:26   DG Chest Port 1 View Result Date: 03/25/2024 EXAM: 1 VIEW(S) XRAY OF THE CHEST 03/25/2024 10:21:02 PM COMPARISON: 03/01/2024 CLINICAL HISTORY: Altered mental status. FINDINGS: LINES, TUBES AND DEVICES: Endotracheal tube in place with tip 4 cm above the carina. Enteric tube in place, courses below diaphragm with tip out of field of  view. Partially imaged cervical spinal fusion hardware in place. LUNGS AND PLEURA: Prominent bilateral interstitial opacities, possibly representing mild pulmonary edema or atypical infection. No pleural effusion. No pneumothorax. HEART AND MEDIASTINUM: No acute abnormality of the cardiac and mediastinal silhouettes. BONES AND SOFT TISSUES: No acute osseous abnormality. IMPRESSION: 1. Prominent bilateral interstitial opacities, possibly representing mild pulmonary edema or atypical infection. Electronically signed by: Greig Pique MD 03/25/2024 10:25 PM EST RP Workstation: [id number]     .Critical Care  Performed by: Emil Share, DO Authorized by: Emil Share, DO   Critical care provider statement:    Critical care time (minutes):  35   Critical care time was exclusive of:  Separately billable procedures and treating other patients   Critical care was time spent personally by me on the following activities:  Development of treatment plan with patient or surrogate, discussions with consultants, evaluation of patient's response to treatment, examination of patient, ordering and review of laboratory studies, ordering and review of radiographic studies, ordering and performing treatments and interventions, pulse oximetry, re-evaluation of patient's condition and review of old charts   Care discussed with: admitting provider   Procedure Name: Intubation Date/Time: 03/25/2024  10:13 PM  Performed by: Emil Share, DOPre-anesthesia Checklist: Patient identified, Patient being monitored, Emergency Drugs available, Timeout performed and Suction available Oxygen Delivery Method: Non-rebreather mask Preoxygenation: Pre-oxygenation with 100% oxygen Induction Type: Rapid sequence Ventilation: Mask ventilation without difficulty Laryngoscope Size: Glidescope Grade View: Grade I Tube size: 7.5 mm Number of attempts: 1 Airway Equipment and Method: Video-laryngoscopy Placement Confirmation: ETT inserted through vocal cords under direct vision, CO2 detector and Breath sounds checked- equal and bilateral Secured at: 22 cm Tube secured with: ETT holder Dental Injury: Teeth and Oropharynx as per pre-operative assessment  Difficulty Due To: Difficulty was anticipated Future Recommendations: Recommend- induction with short-acting agent, and alternative techniques readily available     EJ placement: 18 gauge IV placed in L EJ. Skin prepped with alcohol  pads, L EJ identified with Valsalva. Cannulated with good return of dark, non-pulsatile blood. Tachyderm placed after easily flushed with NS.   Medications Ordered in the ED  etomidate  (AMIDATE ) injection (20 mg Intravenous Given 03/25/24 2158)  rocuronium  (ZEMURON ) injection (100 mg Intravenous Given 03/25/24 2158)  norepinephrine  (LEVOPHED ) 4-5 MG/250ML-% infusion SOLN (has no administration in time range)  propofol  (DIPRIVAN ) 1000 MG/100ML infusion (has no administration in time range)  sodium chloride  0.9 % bolus 1,000 mL (has no administration in time range)  thiamine  (VITAMIN B1) 500 mg in sodium chloride  0.9 % 50 mL IVPB (has no administration in time range)  fentaNYL  (SUBLIMAZE ) 50 MCG/ML injection (has no administration in time range)  fentaNYL  (SUBLIMAZE ) 50 MCG/ML injection (has no administration in time range)                                    Medical Decision Making Amount and/or Complexity of Data  Reviewed Labs: ordered. Radiology: ordered.  Risk Prescription drug management.   84 yo M with a chief complaints of altered mental status.  Patient found on the ground crawling around by his son.  Diminished GCS per EMS.  Agitated unable to get out of the house.  Required Versed .  Upon arrival to the ED patient not predictably protecting his airway.  GCS diminished.  Patient was intubated.  Will obtain an altered mental status workup.  Seems unlikely traumatic but was found on the ground  will obtain CT head through the pelvis.  Chest x-ray blood work.  Reassess.  The patients results and plan were reviewed and discussed.   Any x-rays performed were independently reviewed by myself.   Differential diagnosis were considered with the presenting HPI.  Medications  etomidate  (AMIDATE ) injection (20 mg Intravenous Given 03/25/24 2158)  rocuronium  (ZEMURON ) injection (100 mg Intravenous Given 03/25/24 2158)  norepinephrine  (LEVOPHED ) 4-5 MG/250ML-% infusion SOLN (has no administration in time range)  propofol  (DIPRIVAN ) 1000 MG/100ML infusion (has no administration in time range)  sodium chloride  0.9 % bolus 1,000 mL (has no administration in time range)  thiamine  (VITAMIN B1) 500 mg in sodium chloride  0.9 % 50 mL IVPB (has no administration in time range)  fentaNYL  (SUBLIMAZE ) 50 MCG/ML injection (has no administration in time range)  fentaNYL  (SUBLIMAZE ) 50 MCG/ML injection (has no administration in time range)    Vitals:   03/25/24 2250 03/25/24 2255 03/25/24 2300 03/25/24 2305  BP: 95/62 100/72 99/68 98/74   Pulse: 90 (!) 102 94 100  Resp: 18 18 18 18   Temp:      TempSrc:      SpO2: 100% 100% 100% 100%  Weight:      Height:        Final diagnoses:  Somnolence    Admission/ observation were discussed with the admitting physician, patient and/or family and they are comfortable with the plan.       Final diagnoses:  Somnolence    ED Discharge Orders     None           Emil Share, DO 03/25/24 2313  "

## 2024-03-25 NOTE — ED Triage Notes (Addendum)
 Pt BIB [facility] from home. Per EMS, son found pt on the floor in his room scratching himself and moving erratically. Per EMS, pt was A&Ox2 on scene, uncooperative, and refusing to get on the stretcher. EMS gave 5 mg versed . On arrival to ER, pt not following commands, GCS of 8.

## 2024-03-25 NOTE — Progress Notes (Signed)
 RT transported pt from ED-31 to CT-1 and back with RN at bedside. No complications at this time.

## 2024-03-25 NOTE — H&P (Signed)
 "  NAME:  Dustin [name], MRN:  [id number], DOB:  09-29-1940, LOS: 0 ADMISSION DATE:  03/25/2024, CONSULTATION DATE:  03/25/24 REFERRING MD:  Emil, CHIEF COMPLAINT:  AMS   History of Present Illness:   84 yo M PMH a fib, CKD 3a, COPD, neuropathy, who was recently admitted for altered mental status felt to be secondary to opioid overdose (prescribed). Oxycodone  with thrown away when he was discharged. Had been doing week for a couple weeks since discharge, but approximately 2/4 he became progressively altered. Having some hallucinations per family. 2/5 he was found to be confused and crawling on the floor. He received versed  with EMS and on arrival to ED was not protecting his airway reliably, so was intubated. In the emergency department he became hypotensive and norepinephrine  infusion was started.   PCCM consulted in this setting   Labs are incomplete at time of consultation-- there are results for a VBG, CBC, and LA  CT head C spine c/a/p have been ordered by EM team but not yet completed     Pertinent  Medical History   Afib on eliquis  BPH CKD 3a Depression Neuropathy  Significant Hospital Events: Including procedures, antibiotic start and stop dates in addition to other pertinent events     Interim History / Subjective:    Objective    Blood pressure (!) 83/54, pulse 97, temperature 98.9 F (37.2 C), temperature source Axillary, resp. rate 18, height 6' (1.829 m), weight 106 kg, SpO2 100%.    Vent Mode: PRVC FiO2 (%):  [60 %] 60 % Set Rate:  [18 bmp] 18 bmp Vt Set:  [620 mL] 620 mL PEEP:  [5 cmH20] 5 cmH20 Plateau Pressure:  [14 cmH20] 14 cmH20  No intake or output data in the 24 hours ending 03/25/24 2304 Filed Weights   03/25/24 2238  Weight: 106 kg    Examination: General: Elderly male on vent. Multiple scabs on face, head, abdomen from picking.  HENT: [place]/AT, PERRL, no JVD Lungs: Clear bilateral breath sounds Cardiovascular: RRR, no MRG Abdomen: Soft,  non-tender, non-distended Extremities: No acute deformity Neuro: Sedated  Ammonia 50 TSH 21.7 CT-head, C-spine negative CT chest/abd: mild to mod ascites, trace bilateral pleural effusions with associated atelectasis.    Resolved problem list   Assessment and Plan   Acute encephalopathy Hx polypharmacy  Hx depression, neuropathy  -cymbalta , gabapentin , seroquel   -ddx very broad at this time with bulk of workup still in progress--intracranial process, sepsis, intoxication, HE, withdrawals among others. P -BMP is pending -UDS is pending -UA pending -hold home meds for now -consider EEG - TSH 21, send t3, t4 - Ammonia 50,  lactulose   Acute respiratory failure w hypoxia  Bilateral opacities -- CAP vs pulm edema  Hx COPD  -on 02/2024 hospitalization there were concerns for aspiration  P -ABG  -WUA/SBT in AM -VAP, pulm hygiene  -trach asp, covid/flu/rsv, RVP  -vanc zosyn  doxy, low threshold to DC -PRN duoneb   Hypotensive Possible sev sepsis vs shock encephalopathy P -Bcx, UA, Trach asp -NE infusion for MAP goal 65 -broad abx (vanc zosyn  + doxy for atypical coverage w azithro shortage)  -MRSA PCR, dc vanc if neg   Lactic acidosis  P -trend  -supportive care   Afib on eliquis   P -heparin  per pharmacy -optimize lytes   CKD 3a  Hypocalcemia  P -2g cal glu  -follow renal indices (CMP pending) and UOP   AoC Thrombocytopenia  P -LFTs pending   Labs   CBC:  Recent Labs  Lab 03/25/24 2231  HGB 12.6*  HCT 37.0*    Basic Metabolic Panel: Recent Labs  Lab 03/25/24 2231  NA 141  K 4.4   GFR: CrCl cannot be calculated (Patient's most recent lab result is older than the maximum 21 days allowed.). Recent Labs  Lab 03/25/24 2231  LATICACIDVEN 2.7*    Liver Function Tests: No results for input(s): AST, ALT, ALKPHOS, BILITOT, PROT, ALBUMIN  in the last 168 hours. No results for input(s): LIPASE, AMYLASE in the last 168 hours. No  results for input(s): AMMONIA in the last 168 hours.  ABG    Component Value Date/Time   PHART 7.43 04/02/2023 1009   PCO2ART 35 04/02/2023 1009   PO2ART 102 04/02/2023 1009   HCO3 22.7 03/25/2024 2231   TCO2 24 03/25/2024 2231   ACIDBASEDEF 3.0 (H) 03/25/2024 2231   O2SAT 84 03/25/2024 2231     Coagulation Profile: No results for input(s): INR, PROTIME in the last 168 hours.  Cardiac Enzymes: No results for input(s): CKTOTAL, CKMB, CKMBINDEX, TROPONINI in the last 168 hours.  HbA1C: Hgb A1c MFr Bld  Date/Time Value Ref Range Status  01/23/2016 04:14 AM 5.5 4.8 - 5.6 % Final    Comment:    (NOTE)         Pre-diabetes: 5.7 - 6.4         Diabetes: >6.4         Glycemic control for adults with diabetes: <7.0     CBG: Recent Labs  Lab 03/25/24 2147  GLUCAP 79    Review of Systems:   Patient is encephalopathic and/or intubated; therefore, history has been obtained from chart review.    Past Medical History:  He,  has a past medical history of Atrial fibrillation (HCC), BPH (benign prostatic hyperplasia), Chronic combined systolic and diastolic congestive heart failure (HCC), CKD (chronic kidney disease), GERD (gastroesophageal reflux disease), Insomnia, Neck pain, and Neuropathy.   Surgical History:   Past Surgical History:  Procedure Laterality Date   ANTERIOR CERVICAL DECOMPRESSION/DISCECTOMY FUSION 4 LEVELS N/A 10/27/2012   Procedure: Cervical Three-Four Cervical Four-Five Cervical Five-Six Cervical Six-Seven  Anterior cervical decompression/diskectomy/fusion;  Surgeon: Fairy Levels, MD;  Location: [facility];  Service: Neurosurgery;  Laterality: N/A;  Cervical Three-Four Cervical Four-Five Cervical Five-Six Cervical Six-Seven  Anterior cervical decompression/diskectomy/fusion   CARDIOVERSION N/A 09/20/2020   Procedure: CARDIOVERSION;  Surgeon: Kate Lonni CROME, MD;  Location: [facility] ENDOSCOPY;  Service: Cardiovascular;  Laterality: N/A;   COMPLEX  WOUND CLOSURE Left 02/27/2024   Procedure: COMPLEX CLOSURE, WOUND;  Surgeon: Tobie Eldora NOVAK, MD;  Location: [facility] OR;  Service: ENT;  Laterality: Left;   EXCISION MASS HEAD Left 02/27/2024   Procedure: EXCISION, MASS, HEAD;  Surgeon: Tobie Eldora NOVAK, MD;  Location[facility] OR;  Service: ENT;  Laterality: Left;  Auricular lesion excision , Complex lesion closure   FEMUR IM NAIL Right 03/08/2020   Procedure: INTRAMEDULLARY (IM) RETROGRADE FEMORAL NAILING;  Surgeon: Kendal Franky SQUIBB, MD;  Location: [facility] OR;  Service: Orthopedics;  Laterality: Right;   HARDWARE REMOVAL Right 03/31/2023   Procedure: HARDWARE REMOVAL;  Surgeon: Kendal Franky SQUIBB, MD;  Location: [facility] OR;  Service: Orthopedics;  Laterality: Right;   INTRAMEDULLARY (IM) NAIL INTERTROCHANTERIC Right 03/31/2023   Procedure: INTRAMEDULLARY (IM) NAIL INTERTROCHANTERIC;  Surgeon: Kendal Franky SQUIBB, MD;  Location: [facility] OR;  Service: Orthopedics;  Laterality: Right;   SHOULDER ARTHROSCOPY  2010   rt shoulder   SHOULDER ARTHROSCOPY  02/15/2011   Procedure: ARTHROSCOPY  SHOULDER;  Surgeon: Lamar Prentice Collet;  Location: [facility];  Service: Orthopedics;  Laterality: Right;  Debridement   TONSILLECTOMY       Social History:   reports that he quit smoking about 34 years ago. His smoking use included cigarettes. He smoked an average of 1.5 packs per day. He has never used smokeless tobacco. He reports current alcohol  use of about 3.0 standard drinks of alcohol  per week. He reports that he does not use drugs.   Family History:  His family history includes Cirrhosis in his father; Emphysema in his mother; Stroke in his brother.   Allergies Allergies[1]   Home Medications  Prior to Admission medications  Medication Sig Start Date End Date Taking? Authorizing Provider  acetaminophen  (TYLENOL ) 500 MG tablet Take 2 tablets (1,000 mg total) by mouth every 6 (six) hours as needed. 02/27/24 02/26/25  Tobie Eldora NOVAK, MD  apixaban  (ELIQUIS ) 5 MG TABS tablet  Take 1 tablet (5 mg total) by mouth 2 (two) times daily. 03/02/24   Patsy Lenis, MD  Apoaequorin (PREVAGEN EXTRA STRENGTH) 20 MG CAPS Take 1 capsule by mouth daily.    [provider]  DENTA 5000 PLUS 1.1 % CREA dental cream Place 1 Application onto teeth at bedtime. 03/06/23   [provider]  DULoxetine  (CYMBALTA ) 60 MG capsule Take 60 mg by mouth daily. 05/08/22   [provider]  ferrous sulfate  325 (65 FE) MG tablet Take 1 tablet (325 mg total) by mouth daily with breakfast. 06/14/22   Lue Elsie BROCKS, MD  furosemide  (LASIX ) 40 MG tablet Take 40 mg by mouth daily. 02/13/24   [provider]  gabapentin  (NEURONTIN ) 300 MG capsule Take 1 capsule (300 mg total) by mouth 3 (three) times daily. 03/02/24   Patsy Lenis, MD  metoprolol  tartrate (LOPRESSOR ) 100 MG tablet Take 100 mg by mouth 2 (two) times daily. 04/08/23   [provider]  Multiple Vitamins-Minerals (CENTRUM SILVER 50+MEN) TABS Take 1 tablet by mouth daily.    [provider]  pantoprazole  (PROTONIX ) 40 MG tablet Take 1 tablet (40 mg total) by mouth daily. 03/18/20   Marilee Leach, MD  QUEtiapine  (SEROQUEL ) 300 MG tablet Take 1 tablet (300 mg total) by mouth at bedtime. Patient taking differently: Take 600 mg by mouth in the morning and at bedtime. 04/08/23   Raenelle Coria, MD  rOPINIRole  (REQUIP ) 1 MG tablet Take 1 mg by mouth at bedtime.    [provider]  silodosin (RAPAFLO) 8 MG CAPS capsule Take 8 mg by mouth daily. 03/18/22   [provider]  Vitamin D , Ergocalciferol , (DRISDOL ) 1.25 MG (50000 UNIT) CAPS capsule Take 50,000 Units by mouth every Thursday. 09/09/21   [provider]     Critical care time: 52 minutes     Deward Eastern, AGACNP-BC [facility]  See Amion for personal pager PCCM on call pager [phone] until 7pm. Please call Elink 7p-7a. [phone]  03/26/2024 12:52 AM              [1]   Allergies Allergen Reactions   Ddavp [Desmopressin Acetate] Other (See Comments)    Hyponatremia    Zoloft  [Sertraline ] Other (See Comments)    Felt bad    "

## 2024-03-26 ENCOUNTER — Inpatient Hospital Stay (HOSPITAL_COMMUNITY)

## 2024-03-26 DIAGNOSIS — I9589 Other hypotension: Secondary | ICD-10-CM

## 2024-03-26 LAB — GLUCOSE, CAPILLARY
Glucose-Capillary: 110 mg/dL — ABNORMAL HIGH (ref 70–99)
Glucose-Capillary: 116 mg/dL — ABNORMAL HIGH (ref 70–99)
Glucose-Capillary: 116 mg/dL — ABNORMAL HIGH (ref 70–99)
Glucose-Capillary: 117 mg/dL — ABNORMAL HIGH (ref 70–99)
Glucose-Capillary: 119 mg/dL — ABNORMAL HIGH (ref 70–99)
Glucose-Capillary: 82 mg/dL (ref 70–99)

## 2024-03-26 LAB — BASIC METABOLIC PANEL WITH GFR
Anion gap: 15 (ref 5–15)
Anion gap: 17 — ABNORMAL HIGH (ref 5–15)
BUN: 45 mg/dL — ABNORMAL HIGH (ref 8–23)
BUN: 41 mg/dL — ABNORMAL HIGH (ref 8–23)
CO2: 22 mmol/L (ref 22–32)
CO2: 19 mmol/L — ABNORMAL LOW (ref 22–32)
Calcium: 8.5 mg/dL — ABNORMAL LOW (ref 8.9–10.3)
Calcium: 8.7 mg/dL — ABNORMAL LOW (ref 8.9–10.3)
Chloride: 104 mmol/L (ref 98–111)
Chloride: 104 mmol/L (ref 98–111)
Creatinine, Ser: 2.53 mg/dL — ABNORMAL HIGH (ref 0.61–1.24)
Creatinine, Ser: 2.42 mg/dL — ABNORMAL HIGH (ref 0.61–1.24)
GFR, Estimated: 25 mL/min — ABNORMAL LOW
GFR, Estimated: 26 mL/min — ABNORMAL LOW
Glucose, Bld: 122 mg/dL — ABNORMAL HIGH (ref 70–99)
Glucose, Bld: 101 mg/dL — ABNORMAL HIGH (ref 70–99)
Potassium: 4.3 mmol/L (ref 3.5–5.1)
Potassium: 4.7 mmol/L (ref 3.5–5.1)
Sodium: 141 mmol/L (ref 135–145)
Sodium: 140 mmol/L (ref 135–145)

## 2024-03-26 LAB — APTT
aPTT: >200 s (ref 24–36)
aPTT: 155 s — ABNORMAL HIGH (ref 24–36)

## 2024-03-26 LAB — CBC
HCT: 38.4 % — ABNORMAL LOW (ref 39.0–52.0)
Hemoglobin: 12.5 g/dL — ABNORMAL LOW (ref 13.0–17.0)
MCH: 33.5 pg (ref 26.0–34.0)
MCHC: 32.6 g/dL (ref 30.0–36.0)
MCV: 102.9 fL — ABNORMAL HIGH (ref 80.0–100.0)
Platelets: 70 10*3/uL — ABNORMAL LOW (ref 150–400)
RBC: 3.73 MIL/uL — ABNORMAL LOW (ref 4.22–5.81)
RDW: 15 % (ref 11.5–15.5)
WBC: 8.2 10*3/uL (ref 4.0–10.5)
nRBC: 0 % (ref 0.0–0.2)

## 2024-03-26 LAB — URINE DRUG SCREEN
Amphetamines: NEGATIVE
Barbiturates: NEGATIVE
Benzodiazepines: POSITIVE — AB
Cocaine: NEGATIVE
Fentanyl: POSITIVE — AB
Methadone Scn, Ur: NEGATIVE
Opiates: NEGATIVE
Tetrahydrocannabinol: NEGATIVE

## 2024-03-26 LAB — BLOOD GAS, VENOUS
Acid-base deficit: 2.2 mmol/L — ABNORMAL HIGH (ref 0.0–2.0)
Bicarbonate: 23.2 mmol/L (ref 20.0–28.0)
Drawn by: 5280
O2 Saturation: 47.6 %
Patient temperature: 38.1
pCO2, Ven: 43 mmHg — ABNORMAL LOW (ref 44–60)
pH, Ven: 7.34 (ref 7.25–7.43)
pO2, Ven: 35 mmHg (ref 32–45)

## 2024-03-26 LAB — RESPIRATORY PANEL BY PCR

## 2024-03-26 LAB — COMPREHENSIVE METABOLIC PANEL WITH GFR
ALT: 81 U/L — ABNORMAL HIGH (ref 0–44)
AST: 230 U/L — ABNORMAL HIGH (ref 15–41)
Albumin: 3.8 g/dL (ref 3.5–5.0)
Alkaline Phosphatase: 303 U/L — ABNORMAL HIGH (ref 38–126)
Anion gap: 18 — ABNORMAL HIGH (ref 5–15)
BUN: 45 mg/dL — ABNORMAL HIGH (ref 8–23)
CO2: 20 mmol/L — ABNORMAL LOW (ref 22–32)
Calcium: 8.6 mg/dL — ABNORMAL LOW (ref 8.9–10.3)
Chloride: 102 mmol/L (ref 98–111)
Creatinine, Ser: 2.62 mg/dL — ABNORMAL HIGH (ref 0.61–1.24)
GFR, Estimated: 24 mL/min — ABNORMAL LOW
Glucose, Bld: 106 mg/dL — ABNORMAL HIGH (ref 70–99)
Potassium: 4.5 mmol/L (ref 3.5–5.1)
Sodium: 140 mmol/L (ref 135–145)
Total Bilirubin: 2.5 mg/dL — ABNORMAL HIGH (ref 0.0–1.2)
Total Protein: 6.4 g/dL — ABNORMAL LOW (ref 6.5–8.1)

## 2024-03-26 LAB — CULTURE, BLOOD (ROUTINE X 2)
Culture: NO GROWTH
Culture: NO GROWTH
Special Requests: ADEQUATE
Special Requests: ADEQUATE

## 2024-03-26 LAB — ECHOCARDIOGRAM COMPLETE
AR max vel: 3.76 cm2
AV Area VTI: 3.23 cm2
AV Area mean vel: 3.68 cm2
AV Mean grad: 2 mmHg
AV Peak grad: 3.2 mmHg
Ao pk vel: 0.89 m/s
Area-P 1/2: 3.51 cm2
Height: 72 in
S' Lateral: 3.4 cm
Weight: 3827.19 [oz_av]

## 2024-03-26 LAB — MAGNESIUM
Magnesium: 1.7 mg/dL (ref 1.7–2.4)
Magnesium: 1.8 mg/dL (ref 1.7–2.4)
Magnesium: 2.1 mg/dL (ref 1.7–2.4)

## 2024-03-26 LAB — URINALYSIS, ROUTINE W REFLEX MICROSCOPIC
Bacteria, UA: NONE SEEN
Bilirubin Urine: NEGATIVE
Glucose, UA: NEGATIVE mg/dL
Ketones, ur: NEGATIVE mg/dL
Leukocytes,Ua: NEGATIVE
Nitrite: NEGATIVE
Protein, ur: 30 mg/dL — AB
RBC / HPF: >50 RBC/hpf (ref 0–5)
Specific Gravity, Urine: 1.042 — ABNORMAL HIGH (ref 1.005–1.030)
pH: 5 (ref 5.0–8.0)

## 2024-03-26 LAB — PRO BRAIN NATRIURETIC PEPTIDE: Pro Brain Natriuretic Peptide: 5300 pg/mL — ABNORMAL HIGH

## 2024-03-26 LAB — MRSA NEXT GEN BY PCR, NASAL
MRSA by PCR Next Gen: NOT DETECTED
MRSA by PCR Next Gen: NOT DETECTED

## 2024-03-26 LAB — PHOSPHORUS: Phosphorus: 4.1 mg/dL (ref 2.5–4.6)

## 2024-03-26 LAB — TRIGLYCERIDES: Triglycerides: 118 mg/dL

## 2024-03-26 LAB — LACTIC ACID, PLASMA: Lactic Acid, Venous: 2 mmol/L (ref 0.5–1.9)

## 2024-03-26 LAB — RESP PANEL BY RT-PCR (RSV, FLU A&B, COVID)  RVPGX2
Influenza A by PCR: NEGATIVE
Influenza B by PCR: NEGATIVE
Resp Syncytial Virus by PCR: NEGATIVE
SARS Coronavirus 2 by RT PCR: NEGATIVE

## 2024-03-26 LAB — CK
Total CK: 821 U/L — ABNORMAL HIGH (ref 49–397)
Total CK: 313 U/L (ref 49–397)

## 2024-03-26 LAB — HEPARIN LEVEL (UNFRACTIONATED)
Heparin Unfractionated: >1.1 [IU]/mL — ABNORMAL HIGH (ref 0.30–0.70)
Heparin Unfractionated: >1.1 [IU]/mL — ABNORMAL HIGH (ref 0.30–0.70)

## 2024-03-26 LAB — CORTISOL: Cortisol, Plasma: 21.6 ug/dL

## 2024-03-26 LAB — T4, FREE: Free T4: 0.53 ng/dL — ABNORMAL LOW (ref 0.80–2.00)

## 2024-03-26 LAB — LIPASE, BLOOD: Lipase: 13 U/L (ref 11–51)

## 2024-03-26 LAB — HEMOGLOBIN A1C
Hgb A1c MFr Bld: 5.6 % (ref 4.8–5.6)
Mean Plasma Glucose: 114.02 mg/dL

## 2024-03-26 MED ORDER — PROPOFOL 1000 MG/100ML IV EMUL
0.0000 ug/kg/min | INTRAVENOUS | Status: DC
  Administered 2024-03-26: 30 ug/kg/min via INTRAVENOUS
  Administered 2024-03-26 (×2): 40 ug/kg/min via INTRAVENOUS
  Filled 2024-03-26 (×3): qty 100

## 2024-03-26 MED ORDER — ACETAMINOPHEN 650 MG RE SUPP
650.0000 mg | Freq: Four times a day (QID) | RECTAL | Status: AC | PRN
  Administered 2024-03-26: 650 mg via RECTAL
  Filled 2024-03-26: qty 1

## 2024-03-26 MED ORDER — METOPROLOL TARTRATE 5 MG/5ML IV SOLN
INTRAVENOUS | Status: AC
  Filled 2024-03-26: qty 5

## 2024-03-26 MED ORDER — FUROSEMIDE 10 MG/ML IJ SOLN
40.0000 mg | Freq: Once | INTRAMUSCULAR | Status: AC
  Administered 2024-03-26: 40 mg via INTRAVENOUS

## 2024-03-26 MED ORDER — MAGNESIUM SULFATE 2 GM/50ML IV SOLN
2.0000 g | Freq: Once | INTRAVENOUS | Status: AC
  Administered 2024-03-26: 2 g via INTRAVENOUS
  Filled 2024-03-26: qty 50

## 2024-03-26 MED ORDER — PHENOBARBITAL SODIUM 130 MG/ML IJ SOLN
97.5000 mg | Freq: Three times a day (TID) | INTRAMUSCULAR | Status: AC
  Administered 2024-03-26: 97.5 mg via INTRAVENOUS
  Filled 2024-03-26: qty 1

## 2024-03-26 MED ORDER — FOLIC ACID 5 MG/ML IJ SOLN
1.0000 mg | Freq: Every day | INTRAMUSCULAR | Status: AC
  Administered 2024-03-26: 1 mg via INTRAVENOUS
  Filled 2024-03-26 (×2): qty 0.2

## 2024-03-26 MED ORDER — THIAMINE MONONITRATE 100 MG PO TABS: 200.0000 mg | ORAL_TABLET | ORAL | Status: AC

## 2024-03-26 MED ORDER — CHLORHEXIDINE GLUCONATE CLOTH 2 % EX PADS
6.0000 | MEDICATED_PAD | Freq: Every day | CUTANEOUS | Status: AC
  Administered 2024-03-26 (×2): 6 via TOPICAL

## 2024-03-26 MED ORDER — PHENOBARBITAL SODIUM 65 MG/ML IJ SOLN: 32.5000 mg | Freq: Three times a day (TID) | INTRAMUSCULAR | Status: AC

## 2024-03-26 MED ORDER — HEPARIN (PORCINE) 25000 UT/250ML-% IV SOLN
1300.0000 [IU]/h | INTRAVENOUS | Status: DC
  Administered 2024-03-26: 1300 [IU]/h via INTRAVENOUS
  Filled 2024-03-26: qty 250

## 2024-03-26 MED ORDER — LEVOTHYROXINE SODIUM 25 MCG PO TABS
25.0000 ug | ORAL_TABLET | Freq: Every day | ORAL | Status: AC
  Administered 2024-03-26: 25 ug
  Filled 2024-03-26: qty 1

## 2024-03-26 MED ORDER — AMIODARONE IV BOLUS ONLY 150 MG/100ML
INTRAVENOUS | Status: AC
  Administered 2024-03-26: 150 mg
  Filled 2024-03-26: qty 100

## 2024-03-26 MED ORDER — LORAZEPAM 2 MG/ML IJ SOLN
1.0000 mg | INTRAMUSCULAR | Status: AC | PRN
  Administered 2024-03-26: 1 mg via INTRAVENOUS
  Filled 2024-03-26: qty 1

## 2024-03-26 MED ORDER — PANTOPRAZOLE SODIUM 40 MG IV SOLR
40.0000 mg | INTRAVENOUS | Status: AC
  Administered 2024-03-26: 40 mg via INTRAVENOUS
  Filled 2024-03-26: qty 10

## 2024-03-26 MED ORDER — PHENOBARBITAL SODIUM 130 MG/ML IJ SOLN
100.0000 mg | Freq: Once | INTRAMUSCULAR | Status: AC
  Administered 2024-03-26: 100 mg via INTRAVENOUS
  Filled 2024-03-26: qty 1

## 2024-03-26 MED ORDER — AMIODARONE HCL IN DEXTROSE 360-4.14 MG/200ML-% IV SOLN
60.0000 mg/h | INTRAVENOUS | Status: DC
  Filled 2024-03-26: qty 200

## 2024-03-26 MED ORDER — METOPROLOL TARTRATE 25 MG/10 ML ORAL SUSPENSION
50.0000 mg | Freq: Two times a day (BID) | ORAL | Status: DC
  Filled 2024-03-26 (×2): qty 20

## 2024-03-26 MED ORDER — PHENOBARBITAL 32.4 MG PO TABS: 32.4000 mg | ORAL_TABLET | Freq: Three times a day (TID) | ORAL | Status: DC

## 2024-03-26 MED ORDER — LACTULOSE 10 GM/15ML PO SOLN
30.0000 g | Freq: Three times a day (TID) | ORAL | Status: DC
  Administered 2024-03-26: 30 g
  Filled 2024-03-26: qty 45

## 2024-03-26 MED ORDER — PIPERACILLIN-TAZOBACTAM 3.375 G IVPB
3.3750 g | Freq: Three times a day (TID) | INTRAVENOUS | Status: DC
  Administered 2024-03-26: 3.375 g via INTRAVENOUS
  Filled 2024-03-26: qty 50

## 2024-03-26 MED ORDER — METHYLPREDNISOLONE SODIUM SUCC 125 MG IJ SOLR
125.0000 mg | Freq: Once | INTRAMUSCULAR | Status: AC
  Administered 2024-03-26: 125 mg via INTRAVENOUS
  Filled 2024-03-26: qty 2

## 2024-03-26 MED ORDER — DEXMEDETOMIDINE HCL IN NACL 400 MCG/100ML IV SOLN
INTRAVENOUS | Status: AC
  Filled 2024-03-26: qty 100

## 2024-03-26 MED ORDER — PHENOBARBITAL SODIUM 65 MG/ML IJ SOLN: 65.0000 mg | Freq: Three times a day (TID) | INTRAMUSCULAR | Status: AC

## 2024-03-26 MED ORDER — AMIODARONE HCL IN DEXTROSE 360-4.14 MG/200ML-% IV SOLN: 30.0000 mg/h | INTRAVENOUS | Status: DC

## 2024-03-26 MED ORDER — METOPROLOL TARTRATE 5 MG/5ML IV SOLN
5.0000 mg | Freq: Four times a day (QID) | INTRAVENOUS | Status: DC
  Administered 2024-03-26: 5 mg via INTRAVENOUS
  Filled 2024-03-26: qty 5

## 2024-03-26 MED ORDER — AMIODARONE LOAD VIA INFUSION
150.0000 mg | Freq: Once | INTRAVENOUS | Status: DC
  Filled 2024-03-26: qty 83.34

## 2024-03-26 MED ORDER — ORAL CARE MOUTH RINSE
15.0000 mL | OROMUCOSAL | Status: AC
  Administered 2024-03-26 (×10): 15 mL via OROMUCOSAL

## 2024-03-26 MED ORDER — ALBUTEROL SULFATE (2.5 MG/3ML) 0.083% IN NEBU
INHALATION_SOLUTION | RESPIRATORY_TRACT | Status: AC
  Administered 2024-03-26: 2.5 mg
  Filled 2024-03-26: qty 3

## 2024-03-26 MED ORDER — THIAMINE HCL 100 MG/ML IJ SOLN
100.0000 mg | Freq: Every day | INTRAMUSCULAR | Status: DC
  Administered 2024-03-26: 100 mg via INTRAVENOUS
  Filled 2024-03-26: qty 2

## 2024-03-26 MED ORDER — LORAZEPAM 2 MG/ML IJ SOLN: 1.0000 mg | INTRAMUSCULAR | Status: DC | PRN

## 2024-03-26 MED ORDER — LACTATED RINGERS IV BOLUS
1000.0000 mL | Freq: Once | INTRAVENOUS | Status: AC
  Administered 2024-03-26: 1000 mL via INTRAVENOUS

## 2024-03-26 MED ORDER — ADULT MULTIVITAMIN W/MINERALS CH: 1.0000 | ORAL_TABLET | Freq: Every day | ORAL | Status: AC

## 2024-03-26 MED ORDER — PHENOBARBITAL 32.4 MG PO TABS: 64.8000 mg | ORAL_TABLET | Freq: Three times a day (TID) | ORAL | Status: DC

## 2024-03-26 MED ORDER — DEXMEDETOMIDINE HCL IN NACL 400 MCG/100ML IV SOLN
0.0000 ug/kg/h | INTRAVENOUS | Status: AC
  Administered 2024-03-26: 0.5 ug/kg/h via INTRAVENOUS

## 2024-03-26 MED ORDER — ORAL CARE MOUTH RINSE: 15.0000 mL | OROMUCOSAL | Status: AC | PRN

## 2024-03-26 MED ORDER — AMIODARONE HCL IN DEXTROSE 360-4.14 MG/200ML-% IV SOLN
60.0000 mg/h | INTRAVENOUS | Status: AC
  Administered 2024-03-26: 60 mg/h via INTRAVENOUS

## 2024-03-26 MED ORDER — FUROSEMIDE 10 MG/ML IJ SOLN: 40.0000 mg | Freq: Once | INTRAMUSCULAR | Status: AC

## 2024-03-26 MED ORDER — AMIODARONE HCL IN DEXTROSE 360-4.14 MG/200ML-% IV SOLN: 30.0000 mg/h | INTRAVENOUS | Status: AC

## 2024-03-26 MED ORDER — THIAMINE HCL 100 MG/ML IJ SOLN
200.0000 mg | INTRAVENOUS | Status: AC
  Administered 2024-03-26: 200 mg via INTRAVENOUS
  Filled 2024-03-26: qty 2

## 2024-03-26 MED ORDER — HEPARIN (PORCINE) 25000 UT/250ML-% IV SOLN: 900.0000 [IU]/h | INTRAVENOUS | Status: DC

## 2024-03-26 MED ORDER — AMIODARONE LOAD VIA INFUSION
150.0000 mg | Freq: Once | INTRAVENOUS | Status: AC
  Filled 2024-03-26: qty 83.34

## 2024-03-26 MED ORDER — ALBUTEROL SULFATE (2.5 MG/3ML) 0.083% IN NEBU
10.0000 mg/h | INHALATION_SOLUTION | RESPIRATORY_TRACT | Status: AC
  Administered 2024-03-26: 10 mg/h via RESPIRATORY_TRACT
  Filled 2024-03-26: qty 12

## 2024-03-26 MED ORDER — IPRATROPIUM-ALBUTEROL 0.5-2.5 (3) MG/3ML IN SOLN
3.0000 mL | RESPIRATORY_TRACT | Status: AC
  Administered 2024-03-26 (×2): 3 mL via RESPIRATORY_TRACT
  Filled 2024-03-26: qty 3

## 2024-03-26 MED ORDER — HEPARIN (PORCINE) 25000 UT/250ML-% IV SOLN: 700.0000 [IU]/h | INTRAVENOUS | Status: AC

## 2024-03-26 MED ORDER — VANCOMYCIN HCL IN DEXTROSE 1-5 GM/200ML-% IV SOLN: 1000.0000 mg | INTRAVENOUS | Status: DC

## 2024-03-26 MED ORDER — SODIUM CHLORIDE 0.9 % IV SOLN
2.0000 g | INTRAVENOUS | Status: AC
  Administered 2024-03-26: 2 g via INTRAVENOUS
  Filled 2024-03-26: qty 20

## 2024-03-26 MED ORDER — FENTANYL CITRATE (PF) 50 MCG/ML IJ SOSY
100.0000 ug | PREFILLED_SYRINGE | Freq: Once | INTRAMUSCULAR | Status: AC
  Administered 2024-03-26: 100 ug via INTRAVENOUS

## 2024-03-26 MED ORDER — FUROSEMIDE 10 MG/ML IJ SOLN
INTRAMUSCULAR | Status: AC
  Filled 2024-03-26: qty 4

## 2024-03-26 MED ORDER — METOPROLOL TARTRATE 5 MG/5ML IV SOLN
5.0000 mg | Freq: Once | INTRAVENOUS | Status: AC
  Administered 2024-03-26: 5 mg via INTRAVENOUS

## 2024-03-26 NOTE — Progress Notes (Signed)
 RT transported pt from ED-31 to 3M09 with RN at bedside. No complications at this time.

## 2024-03-26 NOTE — Evaluation (Signed)
 Clinical/Bedside Swallow Evaluation Patient Details  Name: Dustin [name] MRN: [id number] Date of Birth: 1940/03/09  Today's Date: 03/26/2024 Time: SLP Start Time (ACUTE ONLY): 1534 SLP Stop Time (ACUTE ONLY): 1548 SLP Time Calculation (min) (ACUTE ONLY): 14 min  Past Medical History:  Past Medical History:  Diagnosis Date   Atrial fibrillation (HCC)    BPH (benign prostatic hyperplasia)    Chronic combined systolic and diastolic congestive heart failure (HCC)    CKD (chronic kidney disease)    GERD (gastroesophageal reflux disease)    protonix  for control   Insomnia    Neck pain    Neuropathy    Past Surgical History:  Past Surgical History:  Procedure Laterality Date   ANTERIOR CERVICAL DECOMPRESSION/DISCECTOMY FUSION 4 LEVELS N/A 10/27/2012   Procedure: Cervical Three-Four Cervical Four-Five Cervical Five-Six Cervical Six-Seven  Anterior cervical decompression/diskectomy/fusion;  Surgeon: Fairy Levels, MD;  Location: [facility];  Service: Neurosurgery;  Laterality: N/A;  Cervical Three-Four Cervical Four-Five Cervical Five-Six Cervical Six-Seven  Anterior cervical decompression/diskectomy/fusion   CARDIOVERSION N/A 09/20/2020   Procedure: CARDIOVERSION;  Surgeon: Kate Lonni CROME, MD;  [facility]: [facility] ENDOSCOPY;  Service: Cardiovascular;  Laterality: N/A;   COMPLEX WOUND CLOSURE Left 02/27/2024   Procedure: COMPLEX CLOSURE, WOUND;  Surgeon: Tobie Eldora NOVAK, MD;  Location: [facility] OR;  Service: ENT;  Laterality: Left;   EXCISION MASS HEAD Left 02/27/2024   Procedure: EXCISION, MASS, HEAD;  Surgeon: Tobie Eldora NOVAK, MD;  Location: [facility] OR;  Service: ENT;  Laterality: Left;  Auricular lesion excision , Complex lesion closure   FEMUR IM NAIL Right 03/08/2020   Procedure: INTRAMEDULLARY (IM) RETROGRADE FEMORAL NAILING;  Surgeon: Kendal Franky SQUIBB, MD;  Location: [facility] OR;  Service: Orthopedics;  Laterality: Right;   HARDWARE REMOVAL Right 03/31/2023   Procedure: HARDWARE REMOVAL;  Surgeon: Kendal Franky SQUIBB, MD;  Location: [facility] OR;  Service: Orthopedics;  Laterality: Right;   INTRAMEDULLARY (IM) NAIL INTERTROCHANTERIC Right 03/31/2023   Procedure: INTRAMEDULLARY (IM) NAIL INTERTROCHANTERIC;  Surgeon: Kendal Franky SQUIBB, MD;  Location: [facility] OR;  Service: Orthopedics;  Laterality: Right;   SHOULDER ARTHROSCOPY  2010   rt shoulder   SHOULDER ARTHROSCOPY  02/15/2011   Procedure: ARTHROSCOPY SHOULDER;  Surgeon: Lamar Prentice Collet;  Location: [facility];  Service: Orthopedics;  Laterality: Right;  Debridement   TONSILLECTOMY     HPI:  84 yo male presenting to ED 2/5 with AMS. ETT 2/5-2/6. CT C/A/P shows mild L and trace R pleural effusions with associated bilateral lobe atelectasis/scarring and sequelae of prior aspiration. Multiple prior functional-appearing swallow evaluations (2023, 2024, 2026) with primary concern for post-prandial aspiration secondary to h/o GERD. PMH: A-fib, CHF, CKD, GERD    Assessment / Plan / Recommendation  Clinical Impression  Pt's h/o GERD raises suspicion for post-prandial aspiration but he now presents with suspected post-extubation dysphagia. Would anticipate ability to resume diet as vocal quality and cough improve but may need to consider an MBS for further assessment of both oropharyngeal function as well as potential effects of suspected esophageal dysphagia. SLP will f/u and in the interim, can offer ice chips in moderation and meds crushed with puree.  Pt presents with a hoarse and gravelly vocal quality. He fed himself purees and regular solids with poor awareness but ultimate oral clearance. He was unable to drink 3 oz consecutively but smaller volume sips of thin liquids were consistently followed by hydrophonia and coughing. This represents a significant change compared to multiple previous evaluations and is suspected  to be impacted by intubation.   SLP Visit Diagnosis: Dysphagia, unspecified (R13.10)    Aspiration Risk  Moderate aspiration  risk    Diet Recommendation           Other Recommendations Oral Care Recommendations: Oral care QID;Oral care prior to ice chip/H20     Swallow Evaluation Recommendations Recommendations: NPO except meds;Ice chips PRN after oral care Medication Administration: Crushed with puree Oral care recommendations: Oral care QID (4x/day);Oral care before ice chips/water   Assistance Recommended at Discharge    Functional Status Assessment Patient has had a recent decline in their functional status and demonstrates the ability to make significant improvements in function in a reasonable and predictable amount of time.  Frequency and Duration min 2x/week  2 weeks       Prognosis Prognosis for improved oropharyngeal function: Good Barriers to Reach Goals: Time post onset      Swallow Study   General HPI: 84 yo male presenting to ED 2/5 with AMS. ETT 2/5-2/6. CT C/A/P shows mild L and trace R pleural effusions with associated bilateral lobe atelectasis/scarring and sequelae of prior aspiration. Multiple prior functional-appearing swallow evaluations (2023, 2024, 2026) with primary concern for post-prandial aspiration secondary to h/o GERD. PMH: A-fib, CHF, CKD, GERD Type of Study: Bedside Swallow Evaluation Previous Swallow Assessment: see HPI Diet Prior to this Study: NPO Temperature Spikes Noted: No Respiratory Status: Nasal cannula History of Recent Intubation: Yes Total duration of intubation (days): 2 days Date extubated: 03/26/24 Behavior/Cognition: Alert;Cooperative Oral Cavity Assessment: Within Functional Limits Oral Care Completed by SLP: No Oral Cavity - Dentition: Adequate natural dentition Vision: Functional for self-feeding Self-Feeding Abilities: Able to feed self Patient Positioning: Upright in bed Baseline Vocal Quality: Hoarse Volitional Cough: Strong Volitional Swallow: Able to elicit    Oral/Motor/Sensory Function Overall Oral Motor/Sensory Function: Within  functional limits   Ice Chips Ice chips: Not tested   Thin Liquid Thin Liquid: Impaired Presentation: Straw;Cup;Self Fed Pharyngeal  Phase Impairments: Multiple swallows;Wet Vocal Quality;Throat Clearing - Immediate;Cough - Immediate    Nectar Thick Nectar Thick Liquid: Not tested   Honey Thick Honey Thick Liquid: Not tested   Puree Puree: Impaired Presentation: Spoon;Self Fed Oral Phase Impairments: Poor awareness of bolus   Solid     Solid: Within functional limits Presentation: Self Fed      Damien Blumenthal, M.A., CCC-SLP Speech Language Pathology, Acute Rehabilitation Services  Secure Chat preferred [phone]  03/26/2024,4:52 PM

## 2024-03-26 NOTE — Progress Notes (Signed)
 PHARMACY - ANTICOAGULATION CONSULT NOTE  Pharmacy Consult for Heparin  (Apixaban  on hold) Indication: atrial fibrillation  Allergies[1]  Patient Measurements: Height: 6' (182.9 cm) Weight: 108.5 kg (239 lb 3.2 oz) IBW/kg (Calculated) : 77.6 HEPARIN  DW (KG): 99.7  Vital Signs: Temp: 99.3 F (37.4 C) (02/06 1200) Temp Source: Rectal (02/06 1100) BP: 108/64 (02/06 1200) Pulse Rate: 114 (02/06 1200)  Labs: Recent Labs    03/25/24 2230 03/25/24 2231 03/26/24 0236 03/26/24 1119  HGB 12.3* 12.6* 12.5*  --   HCT 38.0* 37.0* 38.4*  --   PLT 82*  --  70*  --   APTT  --   --   --  >200*  HEPARINUNFRC  --   --   --  >1.10*  CREATININE 2.62*  --  2.53*  --   CKTOTAL 821*  --   --  313    Estimated Creatinine Clearance: 28.7 mL/min (A) (by C-G formula based on SCr of 2.53 mg/dL (H)).   Medical History: Past Medical History:  Diagnosis Date   Atrial fibrillation (HCC)    BPH (benign prostatic hyperplasia)    Chronic combined systolic and diastolic congestive heart failure (HCC)    CKD (chronic kidney disease)    GERD (gastroesophageal reflux disease)    protonix  for control   Insomnia    Neck pain    Neuropathy     Assessment: 84 y/o M presents to the ED with altered mental status eventually requiring intubation. On apixaban  PTA for afib. Holding apixaban  and starting heparin . Last dose of apixaban  was 2/5 ~0800, will start heparin  now. Above labs reviewed. Anticipate using aPTT to dose for now.   aPTT >200, HL >1.10.  No bleeding noted.   Goal of Therapy:  Heparin  level 0.3-0.7 units/ml aPTT 66-102 secs Monitor platelets by anticoagulation protocol: Yes   Plan:  Hold heparin  x 1 hour then restart at reduced rate of heparin  900 units/hr Heparin  level and aPTT in 8 hours Daily CBC, heparin  level, and aPTT  Monitor for bleeding  Harlene Boga, PharmD Please refer to AMION for [facility] Pharmacy numbers 03/26/2024, 12:58 PM     [1]  Allergies Allergen Reactions    Ddavp [Desmopressin Acetate] Other (See Comments)    Hyponatremia    Zoloft  [Sertraline ] Other (See Comments)    Felt bad

## 2024-03-26 NOTE — Progress Notes (Addendum)
 Pharmacy Phenobarbital  Consult Note   Pharmacy Consult for IV Phenobarbital   Indication: Alcohol  Withdrawal Prevention  Labs:  Lab Results  Component Value Date   CREATININE 2.53 (H) 03/26/2024   AST 230 (H) 03/25/2024   ALT 81 (H) 03/25/2024   ALKPHOS 303 (H) 03/25/2024    Nursing Bedside Screening:   AUDIT-C:3 PAWSS: 0 Last known drink: unknown CIWA 13  Assessment: Dustin [name] is a 84 y.o. year old male admitted on 03/25/2024. Patients meets criteria for high risk dosing of phenobarbital . Pharmacy consulted to dose phenobarbital .     Plan: Start high risk taper IV: Phenobarbital  97.5mg  IV push q 8h x 6 doses followed by Phenobarbital  65mg  IV push q 8h x 6 doses followed by Phenobarbital  32.4mg  IV push q 8h x 6 doses Start Lorazepam  1mg  IV q4h prn agitation     Thank you for allowing pharmacy to participate in this patient's care.  Vito Ralph, PharmD, BCPS Please see amion for complete clinical pharmacist phone list 03/26/2024,7:32 PM

## 2024-03-26 NOTE — Progress Notes (Signed)
 eLink Physician-Brief Progress Note Patient Name: Dustin [name] DOB: 02-24-1940 MRN: [id number]   Date of Service  03/26/2024  HPI/Events of Note  Bedside RN asking for soft ankle restraints and possibly Bilat soft wrist restraints as well.  eICU Interventions  - wrist and ankle restraints ordered.      Intervention Category Minor Interventions: Agitation / anxiety - evaluation and management Evaluation Type: Other  Baran Kuhrt 03/26/2024, 10:47 PM

## 2024-03-26 NOTE — Progress Notes (Addendum)
 "  NAME:  Dustin [name], MRN:  [id number], DOB:  07/01/1940, LOS: 1 ADMISSION DATE:  03/25/2024, CONSULTATION DATE:  03/25/24 REFERRING MD:  Emil, CHIEF COMPLAINT:  AMS   History of Present Illness:   84 yo M PMH a fib on eliquis , CKD 3a, COPD, HFpEF, neuropathy, who was recently admitted for altered mental status felt to be secondary to opioid overdose (prescribed). Oxycodone  with thrown away when he was discharged. Had been doing well for a couple weeks since discharge, but approximately 2/4 he became progressively altered. Having some hallucinations per family. 2/5 he was found to be confused and crawling on the floor. He received versed  with EMS and on arrival to ED was not protecting his airway reliably, so was intubated. In the emergency department he became hypotensive and norepinephrine  infusion was started. PCCM consulted in this setting   Pertinent  Medical History   Past Medical History:  Diagnosis Date   Atrial fibrillation (HCC)    BPH (benign prostatic hyperplasia)    Chronic combined systolic and diastolic congestive heart failure (HCC)    CKD (chronic kidney disease)    GERD (gastroesophageal reflux disease)    protonix  for control   Insomnia    Neck pain    Neuropathy    Significant Hospital Events: Including procedures, antibiotic start and stop dates in addition to other pertinent events   2/5: Admit to ED  Interim History / Subjective:  Admitted overnight, intubated, sedated, on levo 2/2 sedation. CTH negative. Workup ongoing. TTE pending.   Objective    Blood pressure 92/68, pulse 89, temperature 98 F (36.7 C), temperature source Oral, resp. rate 17, height 6' (1.829 m), weight 108.5 kg, SpO2 99%.    Vent Mode: PRVC FiO2 (%):  [50 %-60 %] 50 % Set Rate:  [18 bmp] 18 bmp Vt Set:  [620 mL] 620 mL PEEP:  [5 cmH20] 5 cmH20 Plateau Pressure:  [14 cmH20-16 cmH20] 16 cmH20   Intake/Output Summary (Last 24 hours) at 03/26/2024 0848 Last data filed at 03/26/2024  0500 Gross per 24 hour  Intake 1972.26 ml  Output 150 ml  Net 1822.26 ml   Filed Weights   03/25/24 2238 03/26/24 0104  Weight: 106 kg 108.5 kg   Examination: General: acutely-ill elderly male, in NAD, intubated and sedated HEENT: AT/[place], PERRL, 2mm bilaterally Pulm: ETT, PSV, CTAB CV: RRR, no m/g/r GI: soft, rounded, non tender to palpation Integumentary: diffuse erythema to BLE distal to knee; scattered abrasions w/ overlying scabs Neuro: intubated sedated   Resolved problem list   Assessment and Plan   Acute encephalopathy of unclear etiology; multifactorial in setting of suspected opiate overdose vs infection vs prolonged downtime prior to discovery Hyperammonemia in setting of ischemic hepatitis/AKI elevated on recent admission (02/2024) treated with lactulose  Hx polypharmacy; medication misuse vs abuse Hx depression, neuropathy, recent hallucinations -Hold home cymbalta , gabapentin , seroquel  at this time -CTH/C-spine negative -Ammonia 50->given lactulose  -UDS + fent/benzos (given in field/ED) -UA negative -consider EEG if no improvement in neuro status when off sedation -Started on Prop/fent->wean off today to determine neuro status -RASS goal 0 to -1 -TTE pending  Acute respiratory failure w hypoxia  Bilateral opacities -c/f aspiration PNA Hx COPD  -on 02/2024 hospitalization there were concerns for aspiration  -CT C/A/P: mild-mod ascites, mild left trace right pleural effusions. Associated bilateral lower lobe atelectasis/scarring with sequela of prior aspiration. Small pericardial effusion. -Wean vent settings to maintain SpO2 >90% -ABG PRN -Daily SAT/SBT -VAP bundle, pulm hygiene  -trach  asp pending, covid/flu/rsv, RVP negative -Empirically started on Zosyn , doxy; Vanc discontinued -PRN duoneb   Undifferentiated shock; distributive vs hypovolemia; with end organ hypoperfusion  Hypotensive  Lactic acidosis Ischemic hepatitis-elevated on recent admission  (02/2024) Rhabdomyolysis (CK 821)  -Bcx pending -UA negative -MRSA nares/RVP/Flu/Covid/RSV negative -Continue vasopressors to maintain MAP >65 -broad abx (vanc, zosyn  + doxy for atypical coverage w azithro shortage); vanc discontinued 2/6 -AST 250; ALT 81; ALP 303 -Trend LFTs -Lactic 2.7->2.0 -Trend CK until down trend  Afib on eliquis   HTN HLD HFpEF -Rate controlled -heparin  gtt per pharmacy; restart Eliquis  prior to discharge -optimize lytes  -Hold antihypertensives until HDS off pressors -Hold home farxiga /lasix  at this time -TTE pending  AKI on CKD 3a  Hypocalcemia  BPH -sCr 2.62 on admission (baseline 1.3-1.5) -iCa 0.97; given 2g cal glu  -Follow renal indices and uop  AoC Thrombocytopenia  Anemia stable since prior hospitalization 02/2024 -Hgb 12.5 -No signs of active bleeding -Continue to monitor -Transfuse for hgb <7  Hx subclinical hypothyroid; TSH 5.22 (10/2023) per chart review Critical illness with underlying hypothyroidism -TSH 21.7; Free T4 0.53 -Will start synthroid  at 25 mcg qd -Repeat thyroid  function panel prior to discharge  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  GERD -Continue pepcid  bid  Stress-induced hyperglycemia -SSI, CBG  BLE cellulitis -STAT venous duplex r/o DVT -Doppler DP pulses present  Labs   CBC: Recent Labs  Lab 03/25/24 2230 03/25/24 2231 03/26/24 0236  WBC 8.6  --  8.2  NEUTROABS 6.2  --   --   HGB 12.3* 12.6* 12.5*  HCT 38.0* 37.0* 38.4*  MCV 102.2*  --  102.9*  PLT 82*  --  70*    Basic Metabolic Panel: Recent Labs  Lab 03/25/24 2230 03/25/24 2231 03/26/24 0235 03/26/24 0236  NA 140 141  --  141  K 4.5 4.4  --  4.3  CL 102  --   --  104  CO2 20*  --   --  22  GLUCOSE 106*  --   --  122*  BUN 45*  --   --  45*  CREATININE 2.62*  --   --  2.53*  CALCIUM  8.6*  --   --  8.5*  MG  --   --  1.7 1.8  PHOS  --   --   --  4.1   GFR: Estimated Creatinine Clearance: 28.7 mL/min (A) (by C-G formula  based on SCr of 2.53 mg/dL (H)). Recent Labs  Lab 03/25/24 2230 03/25/24 2231 03/26/24 0235 03/26/24 0236  WBC 8.6  --   --  8.2  LATICACIDVEN  --  2.7* 2.0*  --     Liver Function Tests: Recent Labs  Lab 03/25/24 2230  AST 230*  ALT 81*  ALKPHOS 303*  BILITOT 2.5*  PROT 6.4*  ALBUMIN  3.8   Recent Labs  Lab 03/25/24 2230  LIPASE 13   Recent Labs  Lab 03/25/24 2230  AMMONIA 50*    ABG    Component Value Date/Time   PHART 7.43 04/02/2023 1009   PCO2ART 35 04/02/2023 1009   PO2ART 102 04/02/2023 1009   HCO3 22.7 03/25/2024 2231   TCO2 24 03/25/2024 2231   ACIDBASEDEF 3.0 (H) 03/25/2024 2231   O2SAT 84 03/25/2024 2231     Coagulation Profile: No results for input(s): INR, PROTIME in the last 168 hours.  Cardiac Enzymes: Recent Labs  Lab 03/25/24 2230  CKTOTAL 821*    HbA1C: Hgb A1c MFr  Bld  Date/Time Value Ref Range Status  03/26/2024 02:35 AM 5.6 4.8 - 5.6 % Final    Comment:    (NOTE) Diagnosis of Diabetes The following HbA1c ranges recommended by the American Diabetes Association (ADA) may be used as an aid in the diagnosis of diabetes mellitus.  Hemoglobin             Suggested A1C NGSP%              Diagnosis  <5.7                   Non Diabetic  5.7-6.4                Pre-Diabetic  >6.4                   Diabetic  <7.0                   Glycemic control for                       adults with diabetes.    01/23/2016 04:14 AM 5.5 4.8 - 5.6 % Final    Comment:    (NOTE)         Pre-diabetes: 5.7 - 6.4         Diabetes: >6.4         Glycemic control for adults with diabetes: <7.0     CBG: Recent Labs  Lab 03/25/24 2147 03/26/24 0054 03/26/24 0337 03/26/24 0741  GLUCAP 79 119* 116* 110*    Review of Systems:   Patient is encephalopathic and/or intubated; therefore, history has been obtained from chart review.    Past Medical History:  He,  has a past medical history of Atrial fibrillation (HCC), BPH (benign  prostatic hyperplasia), Chronic combined systolic and diastolic congestive heart failure (HCC), CKD (chronic kidney disease), GERD (gastroesophageal reflux disease), Insomnia, Neck pain, and Neuropathy.   Surgical History:   Past Surgical History:  Procedure Laterality Date   ANTERIOR CERVICAL DECOMPRESSION/DISCECTOMY FUSION 4 LEVELS N/A 10/27/2012   Procedure: Cervical Three-Four Cervical Four-Five Cervical Five-Six Cervical Six-Seven  Anterior cervical decompression/diskectomy/fusion;  Surgeon: Fairy Levels, MD;  Location: [facility];  Service: Neurosurgery;  Laterality: N/A;  Cervical Three-Four Cervical Four-Five Cervical Five-Six Cervical Six-Seven  Anterior cervical decompression/diskectomy/fusion   CARDIOVERSION N/A 09/20/2020   Procedure: CARDIOVERSION;  Surgeon: Kate Lonni CROME, MD;  Location: [facility] ENDOSCOPY;  Service: Cardiovascular;  Laterality: N/A;   COMPLEX WOUND CLOSURE Left 02/27/2024   Procedure: COMPLEX CLOSURE, WOUND;  Surgeon: Tobie Eldora NOVAK, MD;  Location: [facility] OR;  Service: ENT;  Laterality: Left;   EXCISION MASS HEAD Left 02/27/2024   Procedure: EXCISION, MASS, HEAD;  Surgeon: Tobie Eldora NOVAK, MD;  Location: [facility] OR;  Service: ENT;  Laterality: Left;  Auricular lesion excision , Complex lesion closure   FEMUR IM NAIL Right 03/08/2020   Procedure: INTRAMEDULLARY (IM) RETROGRADE FEMORAL NAILING;  Surgeon: Kendal Franky SQUIBB, MD;  Location: [facility] OR;  Service: Orthopedics;  Laterality: Right;   HARDWARE REMOVAL Right 03/31/2023   Procedure: HARDWARE REMOVAL;  Surgeon: Kendal Franky SQUIBB, MD;  Location: [facility] OR;  Service: Orthopedics;  Laterality: Right;   INTRAMEDULLARY (IM) NAIL INTERTROCHANTERIC Right 03/31/2023   Procedure: INTRAMEDULLARY (IM) NAIL INTERTROCHANTERIC;  Surgeon: Kendal Franky SQUIBB, MD;  Location: [facility] OR;  Service: Orthopedics;  Laterality: Right;   SHOULDER ARTHROSCOPY  2010   rt shoulder   SHOULDER ARTHROSCOPY  02/15/2011  Procedure: ARTHROSCOPY SHOULDER;  Surgeon: Lamar Prentice Collet;  Location: [facility];  Service: Orthopedics;  Laterality: Right;  Debridement   TONSILLECTOMY       Social History:   reports that he quit smoking about 34 years ago. His smoking use included cigarettes. He smoked an average of 1.5 packs per day. He has never used smokeless tobacco. He reports current alcohol  use of about 3.0 standard drinks of alcohol  per week. He reports that he does not use drugs.   Family History:  His family history includes Cirrhosis in his father; Emphysema in his mother; Stroke in his brother.   Allergies Allergies[1]   Home Medications  Prior to Admission medications  Medication Sig Start Date End Date Taking? Authorizing Provider  acetaminophen  (TYLENOL ) 500 MG tablet Take 2 tablets (1,000 mg total) by mouth every 6 (six) hours as needed. 02/27/24 02/26/25  Tobie Eldora NOVAK, MD  apixaban  (ELIQUIS ) 5 MG TABS tablet Take 1 tablet (5 mg total) by mouth 2 (two) times daily. 03/02/24   Patsy Lenis, MD  Apoaequorin (PREVAGEN EXTRA STRENGTH) 20 MG CAPS Take 1 capsule by mouth daily.    [provider]  DENTA 5000 PLUS 1.1 % CREA dental cream Place 1 Application onto teeth at bedtime. 03/06/23   [provider]  DULoxetine  (CYMBALTA ) 60 MG capsule Take 60 mg by mouth daily. 05/08/22   [provider]  ferrous sulfate  325 (65 FE) MG tablet Take 1 tablet (325 mg total) by mouth daily with breakfast. 06/14/22   Lue Elsie BROCKS, MD  furosemide  (LASIX ) 40 MG tablet Take 40 mg by mouth daily. 02/13/24   [provider]  gabapentin  (NEURONTIN ) 300 MG capsule Take 1 capsule (300 mg total) by mouth 3 (three) times daily. 03/02/24   Patsy Lenis, MD  metoprolol  tartrate (LOPRESSOR ) 100 MG tablet Take 100 mg by mouth 2 (two) times daily. 04/08/23   [provider]  Multiple Vitamins-Minerals (CENTRUM SILVER 50+MEN) TABS Take 1 tablet by mouth daily.    [provider]  pantoprazole  (PROTONIX ) 40 MG  tablet Take 1 tablet (40 mg total) by mouth daily. 03/18/20   Marilee Leach, MD  QUEtiapine  (SEROQUEL ) 300 MG tablet Take 1 tablet (300 mg total) by mouth at bedtime. Patient taking differently: Take 600 mg by mouth in the morning and at bedtime. 04/08/23   Raenelle Coria, MD  rOPINIRole  (REQUIP ) 1 MG tablet Take 1 mg by mouth at bedtime.    [provider]  silodosin (RAPAFLO) 8 MG CAPS capsule Take 8 mg by mouth daily. 03/18/22   [provider]  Vitamin D , Ergocalciferol , (DRISDOL ) 1.25 MG (50000 UNIT) CAPS capsule Take 50,000 Units by mouth every Thursday. 09/09/21   [provider]     Critical care time: 45 minutes   Sheamus Hasting, DNP, AGACNP-BC [facility]  Please see [email or web address] for pager details.  From 7A-7P if no response, please call [phone]. After hours, please call ELink [phone].     [1]  Allergies Allergen Reactions   Ddavp [Desmopressin Acetate] Other (See Comments)    Hyponatremia    Zoloft  [Sertraline ] Other (See Comments)    Felt bad    "

## 2024-03-26 NOTE — Progress Notes (Addendum)
 PCCM UPDATE  Called to bedside, patient with increased work of breathing, agitation, and bilateral wheeze, SpO2 80s on 4L [place], HR 130-150s; in Afib RVR confirmed with EKG. A&O x3. TTE this morning with EF 45% (prev 50-55%), severely enlarged RV, RVSP 52.2 mmHg, R S prime 9.17 cm/s, concerning for RV strain. PE on differential, however patient already anticoagulated on Heparin  gtt, will defer CTA. Patient also admits to drinking 3-4 vodka drinks a night. Admits to hx of hallucinations, denies seizure withdrawals. Will add CIWA and consult pharmacy for phenobarbital  taper.  Plan: -Amio bolus+gtt -Lasix  40mg  once -Nebs -Low threshold to reintubateif worsening resp status -CO2 43, pH 7.34 on VBG -BMP, Mg pending -Will add CIWA +phenobarb pharmacy consult  CC time: 30 min   Darby Fleeman, DNP, AGACNP-BC [facility]  Please see [email or web address] for pager details.  From 7A-7P if no response, please call [phone]. After hours, please call ELink [phone].

## 2024-03-26 NOTE — Progress Notes (Signed)
 Pharmacy Antibiotic Note  Dustin [name] is a 84 y.o. male admitted on 03/25/2024 with sepsis.  Pharmacy has been consulted for Vancomycin  dosing. WBC WNL. Noted renal dysfunction. From home with altered mental status.   Plan: Vancomycin  1000 mg IV q24h >>>Estimated AUC: 527 Zosyn  per MD Trend WBC, temp, renal function  F/U infectious work-up Drug levels as indicated   Height: 6' (182.9 cm) Weight: 108.5 kg (239 lb 3.2 oz) IBW/kg (Calculated) : 77.6  Temp (24hrs), Avg:97.8 F (36.6 C), Min:96.7 F (35.9 C), Max:98.9 F (37.2 C)  Recent Labs  Lab 03/25/24 2230 03/25/24 2231  WBC 8.6  --   CREATININE 2.62*  --   LATICACIDVEN  --  2.7*    Estimated Creatinine Clearance: 27.7 mL/min (A) (by C-G formula based on SCr of 2.62 mg/dL (H)).    Allergies[1]  Lynwood Mckusick, PharmD, BCPS Clinical Pharmacist Phone: [phone]      [1]  Allergies Allergen Reactions   Ddavp [Desmopressin Acetate] Other (See Comments)    Hyponatremia    Zoloft  [Sertraline ] Other (See Comments)    Felt bad

## 2024-03-26 NOTE — Progress Notes (Signed)
 Bilateral lower extremity venous duplex has been completed. Preliminary results can be found in CV Proc through chart review.  Results were given to Jamie Dagenhart NP.  03/26/24 1:55 PM Cathlyn Collet RVT

## 2024-03-26 NOTE — TOC Initial Note (Signed)
 Transition of Care [facility]) - Initial/Assessment Note    Patient Details  Name: Dustin [name] MRN: [id number] Date of Birth: [birth year]  Transition of Care [facility]) CM/SW Contact:    Lendia Dais, LCSWA Phone Number: 03/26/2024, 4:24 PM  Clinical Narrative:  Pt was extubated and on weaning trial. Pt is from home w/ fam, indep, drives, and has the DME of a cane and walker. Pt has seen PCP in the last year and HCPOA/legal guardian is the son Garrel. Pt's daughter Saddie and granddaughter Margarie at bedside.  Pt takes meds but only nighttime meds as of recently. Margarie reported that previously the patient managed taking his meds but has been forgetting to take their morning meds recently.  Margarie report no SDOH concerns and stated that the pt is able to afford all basic needs. Pt has a hx of afib and anxiety.  No current TOC needs. Please place consult if further needs arise.   Expected Discharge Plan: Home/Self Care Barriers to Discharge: Continued Medical Work up   Patient Goals and CMS Choice Patient states their goals for this hospitalization and ongoing recovery are:: return home          Expected Discharge Plan and Services In-house Referral: Clinical Social Work     Living arrangements for the past 2 months: Single Family Home                                      Prior Living Arrangements/Services Living arrangements for the past 2 months: Single Family Home Lives with:: Relatives Patient language and need for interpreter reviewed:: Yes Do you feel safe going back to the place where you live?: Yes      Need for Family Participation in Patient Care: Yes (Comment) Care giver support system in place?: Yes (comment) Current home services: DME (cane/walker) Criminal Activity/Legal Involvement Pertinent to Current Situation/Hospitalization: No - Comment as needed  Activities of Daily Living      Permission Sought/Granted Permission sought to share information with  : Family Supports Permission granted to share information with : Yes, Verbal Permission Granted  Share Information with NAME: Decorian Schuenemann     Permission granted to share info w Relationship: Granddaughter  Permission granted to share info w Contact Information: [phone]  Emotional Assessment Appearance:: Appears stated age Attitude/Demeanor/Rapport: Lethargic, Sedated Affect (typically observed): Pleasant Orientation: : Oriented to Self Alcohol  / Substance Use: Not Applicable Psych Involvement: No (comment)  Admission diagnosis:  Somnolence [R40.0] Acute encephalopathy [G93.40] Acute metabolic encephalopathy [G93.41] Patient Active Problem List   Diagnosis Date Noted   Acute metabolic encephalopathy 03/25/2024   Acute encephalopathy 03/25/2024   Transaminitis 03/01/2024   Altered mental status 03/01/2024   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   Hardening of the aorta (main artery of the heart) 02/24/2024   Heart failure (HCC) 02/24/2024   Hypercholesterolemia 02/24/2024   Stage 3a chronic kidney disease (HCC) 02/24/2024   Tremor due to disorder of central nervous system 02/24/2024   Thrombocytopenia 03/30/2023   Femoral fracture (HCC) 03/30/2023   Acute respiratory failure due to COVID-19 (HCC) 11/13/2022   COPD (chronic obstructive pulmonary disease) (HCC) 11/13/2022   Emphysema of lung (HCC) 06/12/2022   AAA (abdominal aortic aneurysm) 06/12/2022   Urinary tract infection without hematuria    Persistent atrial fibrillation (HCC) 08/25/2020   Left ventricular dysfunction 08/25/2020   Acute posthemorrhagic anemia 03/17/2020  Femur fracture (HCC) 03/07/2020   Hip fracture (HCC) 03/07/2020   Cellulitis of right lower extremity    Unspecified fracture of right foot, initial encounter for closed fracture    Acute urinary retention    Idiopathic peripheral neuropathy    Sepsis due to cellulitis (HCC) 10/07/2019   Weakness 11/26/2017   Right lumbar  radiculopathy 09/18/2016   Right leg pain 07/22/2016   Restless leg syndrome 05/28/2016   Iron  deficiency anemia 05/28/2016   Fall 02/10/2016   Rib fracture 02/10/2016   Chronic combined systolic (congestive) and diastolic (congestive) heart failure (HCC)    Ataxia    Gait instability 01/22/2016   Urinary retention    Stroke-like symptom    Syncope 05/02/2015   Closed left fibular fracture 05/02/2015   GERD (gastroesophageal reflux disease) 05/02/2015   BPH (benign prostatic hyperplasia) 05/02/2015   Fracture of distal end of left fibula    Memory loss 01/30/2015   Depression 01/30/2015   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 [place] [address] Phone: [phone] Fax: [phone]     Social Drivers of Health (SDOH) Social History: SDOH Screenings   Food Insecurity: Unknown (03/26/2024)  Housing: Unknown (03/26/2024)  Transportation Needs: Unknown (03/26/2024)  Utilities: Patient Unable To Answer (03/26/2024)  Social Connections: Unknown (03/26/2024)  Tobacco Use: Medium Risk (03/11/2024)   SDOH Interventions:     Readmission Risk Interventions    06/13/2022   10:53 AM  Readmission Risk Prevention Plan  Transportation Screening Complete  PCP or Specialist Appt within 5-7 Days Complete  Home Care Screening Complete  Medication Review (RN CM) Complete

## 2024-03-26 NOTE — Progress Notes (Signed)
 PHARMACY - ANTICOAGULATION CONSULT NOTE  Pharmacy Consult for Heparin  (Apixaban  on hold) Indication: atrial fibrillation  Allergies[1]  Patient Measurements: Height: 6' (182.9 cm) Weight: 108.5 kg (239 lb 3.2 oz) IBW/kg (Calculated) : 77.6 HEPARIN  DW (KG): 99.7  Vital Signs: Temp: 96.7 F (35.9 C) (02/06 0057) Temp Source: Axillary (02/06 0057) BP: 114/79 (02/06 0030) Pulse Rate: 104 (02/06 0030)  Labs: Recent Labs    03/25/24 2230 03/25/24 2231  HGB 12.3* 12.6*  HCT 38.0* 37.0*  PLT 82*  --   CREATININE 2.62*  --   CKTOTAL 821*  --     Estimated Creatinine Clearance: 27.7 mL/min (A) (by C-G formula based on SCr of 2.62 mg/dL (H)).   Medical History: Past Medical History:  Diagnosis Date   Atrial fibrillation (HCC)    BPH (benign prostatic hyperplasia)    Chronic combined systolic and diastolic congestive heart failure (HCC)    CKD (chronic kidney disease)    GERD (gastroesophageal reflux disease)    protonix  for control   Insomnia    Neck pain    Neuropathy     Assessment: 84 y/o M presents to the ED with altered mental status eventually requiring intubation. On apixaban  PTA for afib. Holding apixaban  and starting heparin . Last dose of apixaban  was 2/5 ~0800, will start heparin  now. Above labs reviewed. Anticipate using aPTT to dose for now.   Goal of Therapy:  Heparin  level 0.3-0.7 units/ml aPTT 66-102 secs Monitor platelets by anticoagulation protocol: Yes   Plan:  Start heparin  at 1300 units/hr Heparin  level and aPTT in 8 hours Daily CBC, heparin  level, and aPTT  Monitor for bleeding  Lynwood Mckusick, PharmD, BCPS Clinical Pharmacist Phone: [phone]      [1]  Allergies Allergen Reactions   Ddavp [Desmopressin Acetate] Other (See Comments)    Hyponatremia    Zoloft  [Sertraline ] Other (See Comments)    Felt bad

## 2024-03-26 NOTE — Progress Notes (Signed)
 Pt is intubated and unable to participate in initial assessment. CSW attempted to contact pt's granddaughter Margarie, CSW left a VM.  Lendia Dais, [facility] Inpatient Care Management (ICM) [phone]

## 2024-03-26 NOTE — Progress Notes (Signed)
 eLink Physician-Brief Progress Note Patient Name: Dustin [name] DOB: Mar 22, 1940 MRN: [id number]   Date of Service  03/26/2024  HPI/Events of Note  Patient admitted with altered mental status and acute respiratory failure requiring intubation, suspected to be related to prescription medication toxicity / overdose. He is on the ventilator and work-up is in progress.  eICU Interventions  New Patient Evaluation.        Regana Kemple U Tiyana Galla 03/26/2024, 1:24 AM

## 2024-03-26 NOTE — Plan of Care (Signed)
" °  Problem: Respiratory: Goal: Ability to maintain a clear airway and adequate ventilation will improve Outcome: Progressing   Problem: Clinical Measurements: Goal: Respiratory complications will improve Outcome: Progressing   Problem: Elimination: Goal: Will not experience complications related to urinary retention Outcome: Progressing   "

## 2024-03-26 NOTE — Progress Notes (Addendum)
 PHARMACY - ANTICOAGULATION CONSULT NOTE  Pharmacy Consult for Heparin  (Apixaban  on hold) Indication: atrial fibrillation  Allergies[1]  Patient Measurements: Height: 6' (182.9 cm) Weight: 108.5 kg (239 lb 3.2 oz) IBW/kg (Calculated) : 77.6 HEPARIN  DW (KG): 99.7  Vital Signs: Temp: 100.4 F (38 C) (02/06 2130) Temp Source: Rectal (02/06 2000) BP: 97/78 (02/06 2130) Pulse Rate: 123 (02/06 2130)  Labs: Recent Labs    03/25/24 2230 03/25/24 2231 03/26/24 0236 03/26/24 1119 03/26/24 1841 03/26/24 2140  HGB 12.3* 12.6* 12.5*  --   --   --   HCT 38.0* 37.0* 38.4*  --   --   --   PLT 82*  --  70*  --   --   --   APTT  --   --   --  >200*  --  155*  HEPARINUNFRC  --   --   --  >1.10*  --  >1.10*  CREATININE 2.62*  --  2.53*  --  2.42*  --   CKTOTAL 821*  --   --  313  --   --     Estimated Creatinine Clearance: 30 mL/min (A) (by C-G formula based on SCr of 2.42 mg/dL (H)).   Assessment: 84 y/o M presents to the ED with altered mental status eventually requiring intubation. On apixaban  PTA for afib. Holding apixaban  and starting heparin . Last dose of apixaban  was 2/5 ~0800. Anticipate using aPTT to dose for now.   Heparin  level >1.1 (affected by apixaban ), aPTT 155 (supratherapeutic) on infusion at 900 units/hr. RN confirmed labs drawn from arm opposite where heparin  infusion. No bleeding noted.  Goal of Therapy:  Heparin  level 0.3-0.7 units/ml aPTT 66-102 sec Monitor platelets by anticoagulation protocol: Yes   Plan:  Hold heparin  x 1 hour Restart heparin  at 700 units/hr Heparin  level and aPTT in 8 hours  Vito Ralph, PharmD, BCPS Please see amion for complete clinical pharmacist phone list       [1]  Allergies Allergen Reactions   Ddavp [Desmopressin Acetate] Other (See Comments)    Hyponatremia    Zoloft  [Sertraline ] Other (See Comments)    Felt bad

## 2024-03-26 NOTE — Procedures (Signed)
 Extubation Procedure Note  Patient Details:   Name: Dustin [name] DOB: 1940/10/25 MRN: [id number]   Airway Documentation:    Vent end date: 03/26/24 Vent end time: 1140   Evaluation  O2 sats: stable throughout Complications: No apparent complications Patient did tolerate procedure well. Bilateral Breath Sounds: Diminished   Yes Pt was successfully extubated with no apparent complications. Positive cuffleak heard prior extubation. Pt is VSS at 4L [place] and is stable at this time  Dustin [name] 03/26/2024, 12:44 PM

## 2024-03-26 NOTE — Progress Notes (Signed)
 Pt placed on PS/CPAP mode on ventilator per daily respiratory wean assessment protocol. Pt on 5/5 40% and is tolerating well at this time.

## 2024-05-11 ENCOUNTER — Ambulatory Visit (INDEPENDENT_AMBULATORY_CARE_PROVIDER_SITE_OTHER): Admitting: Otolaryngology

## 2025-02-04 ENCOUNTER — Inpatient Hospital Stay (HOSPITAL_COMMUNITY): Admit: 2025-02-04 | Payer: Self-pay
